# Patient Record
Sex: Female | Born: 1935 | Race: White | Hispanic: No | Smoking: Former smoker
Health system: Southern US, Community
[De-identification: ages and names within clinical notes are randomized; demographics above are authoritative.]

## PROBLEM LIST (undated history)

## (undated) DIAGNOSIS — I10 Essential (primary) hypertension: Secondary | ICD-10-CM

## (undated) DIAGNOSIS — E785 Hyperlipidemia, unspecified: Secondary | ICD-10-CM

## (undated) DIAGNOSIS — I219 Acute myocardial infarction, unspecified: Secondary | ICD-10-CM

## (undated) HISTORY — DX: Hyperlipidemia, unspecified: E78.5

## (undated) HISTORY — PX: CARDIAC CATHETERIZATION: SHX172

## (undated) HISTORY — DX: Essential (primary) hypertension: I10

## (undated) HISTORY — PX: TONSILLECTOMY AND ADENOIDECTOMY: SUR1326

## (undated) HISTORY — PX: CHOLECYSTECTOMY: SHX55

---

## 1993-02-17 HISTORY — PX: ABDOMINAL HYSTERECTOMY: SHX81

## 2000-11-06 ENCOUNTER — Inpatient Hospital Stay (HOSPITAL_COMMUNITY): Admission: EM | Admit: 2000-11-06 | Discharge: 2000-11-15 | Payer: Self-pay | Admitting: *Deleted

## 2000-11-06 ENCOUNTER — Encounter: Payer: Self-pay | Admitting: *Deleted

## 2000-11-06 ENCOUNTER — Emergency Department (HOSPITAL_COMMUNITY): Admission: EM | Admit: 2000-11-06 | Discharge: 2000-11-06 | Payer: Self-pay | Admitting: *Deleted

## 2000-11-10 ENCOUNTER — Encounter: Payer: Self-pay | Admitting: Cardiology

## 2000-11-11 ENCOUNTER — Encounter: Payer: Self-pay | Admitting: *Deleted

## 2000-11-11 ENCOUNTER — Encounter: Payer: Self-pay | Admitting: Cardiology

## 2000-12-02 ENCOUNTER — Inpatient Hospital Stay (HOSPITAL_COMMUNITY): Admission: RE | Admit: 2000-12-02 | Discharge: 2000-12-03 | Payer: Self-pay | Admitting: *Deleted

## 2001-07-13 ENCOUNTER — Encounter: Payer: Self-pay | Admitting: Emergency Medicine

## 2001-07-13 ENCOUNTER — Emergency Department (HOSPITAL_COMMUNITY): Admission: EM | Admit: 2001-07-13 | Discharge: 2001-07-13 | Payer: Self-pay | Admitting: Emergency Medicine

## 2001-07-13 ENCOUNTER — Inpatient Hospital Stay (HOSPITAL_COMMUNITY): Admission: EM | Admit: 2001-07-13 | Discharge: 2001-07-16 | Payer: Self-pay | Admitting: *Deleted

## 2002-03-03 ENCOUNTER — Encounter: Payer: Self-pay | Admitting: General Surgery

## 2002-03-03 ENCOUNTER — Ambulatory Visit (HOSPITAL_COMMUNITY): Admission: RE | Admit: 2002-03-03 | Discharge: 2002-03-03 | Payer: Self-pay | Admitting: General Surgery

## 2005-12-08 ENCOUNTER — Ambulatory Visit (HOSPITAL_COMMUNITY): Admission: RE | Admit: 2005-12-08 | Discharge: 2005-12-08 | Payer: Self-pay | Admitting: Family Medicine

## 2005-12-24 ENCOUNTER — Ambulatory Visit: Payer: Self-pay | Admitting: Gastroenterology

## 2005-12-30 ENCOUNTER — Ambulatory Visit: Payer: Self-pay | Admitting: Gastroenterology

## 2005-12-30 ENCOUNTER — Ambulatory Visit (HOSPITAL_COMMUNITY): Admission: RE | Admit: 2005-12-30 | Discharge: 2005-12-30 | Payer: Self-pay | Admitting: Gastroenterology

## 2006-02-11 ENCOUNTER — Ambulatory Visit: Payer: Self-pay | Admitting: Gastroenterology

## 2006-11-27 ENCOUNTER — Ambulatory Visit: Payer: Self-pay | Admitting: Gastroenterology

## 2006-12-10 ENCOUNTER — Ambulatory Visit (HOSPITAL_COMMUNITY): Admission: RE | Admit: 2006-12-10 | Discharge: 2006-12-10 | Payer: Self-pay | Admitting: Ophthalmology

## 2007-01-07 ENCOUNTER — Ambulatory Visit (HOSPITAL_COMMUNITY): Admission: RE | Admit: 2007-01-07 | Discharge: 2007-01-07 | Payer: Self-pay | Admitting: Ophthalmology

## 2007-02-23 ENCOUNTER — Ambulatory Visit: Payer: Self-pay | Admitting: Gastroenterology

## 2007-04-29 ENCOUNTER — Ambulatory Visit (HOSPITAL_COMMUNITY): Admission: RE | Admit: 2007-04-29 | Discharge: 2007-04-29 | Payer: Self-pay | Admitting: Family Medicine

## 2007-05-03 ENCOUNTER — Ambulatory Visit: Payer: Self-pay | Admitting: Internal Medicine

## 2007-05-10 ENCOUNTER — Ambulatory Visit (HOSPITAL_COMMUNITY): Admission: RE | Admit: 2007-05-10 | Discharge: 2007-05-10 | Payer: Self-pay | Admitting: Family Medicine

## 2008-09-26 ENCOUNTER — Ambulatory Visit: Payer: Self-pay | Admitting: Family Medicine

## 2008-09-26 DIAGNOSIS — M109 Gout, unspecified: Secondary | ICD-10-CM | POA: Insufficient documentation

## 2008-09-26 DIAGNOSIS — E039 Hypothyroidism, unspecified: Secondary | ICD-10-CM | POA: Insufficient documentation

## 2009-01-17 ENCOUNTER — Ambulatory Visit: Payer: Self-pay | Admitting: Family Medicine

## 2009-01-25 ENCOUNTER — Ambulatory Visit: Payer: Self-pay | Admitting: Family Medicine

## 2009-06-04 ENCOUNTER — Ambulatory Visit: Payer: Self-pay | Admitting: Family Medicine

## 2010-06-04 ENCOUNTER — Ambulatory Visit: Payer: Self-pay | Admitting: Family Medicine

## 2010-06-25 ENCOUNTER — Ambulatory Visit: Payer: Self-pay | Admitting: Family Medicine

## 2010-06-25 LAB — HM DEXA SCAN

## 2010-07-02 NOTE — Assessment & Plan Note (Signed)
[name] [name], [name] [name]               CHART#:  [id number]   DATE:  11/27/2006                       DOB:  [birth year]   CHIEF COMPLAINT:  Nausea, reflux.   SUBJECTIVE:  Patient is a 77 year old Caucasian female who has a history  of chronic constipation.  She tells me her constipation is doing quite  well.  Her main complaint today is acid reflux.  She is having problems  with certain foods.  She feels as though she eats too much.  She  complains of indigestion as well as heartburn, especially with spicy  foods.  She is also having some abdominal bloating and nausea but denies  any vomiting.  Her weight has remained stable.  She is having some  epigastric discomfort.  She is taking Advil 1-2 daily, almost every day  of the week along with a daily aspirin 81 mg.  She denies any anorexia  or early satiety.  She occasionally finds herself eating Saltine  crackers to make her stomach feel better.  She denies any dysphagia or  odynophagia.  She denies any rectal bleeding or melena.  She denies any  diarrhea, fever, or chills.  She tells me the pain feels like hunger  pangs.  Her weight has remained stable.   CURRENT MEDICATIONS:  See the list from 11/27/2006.   ALLERGIES:  No known drug allergies.   OBJECTIVE:  Vital signs:  Weight 182 pounds.  Height 60 inches.  Temp  98.4, blood pressure 130/70.  Pulse 72.  General:  Patient is an elderly  Caucasian female who is alert, oriented, positive, cooperative in no  acute distress.  HEENT:  Sclerae are clear.  Nonicteric.  Conjunctivae  pink.  Oropharynx pink and moist without any lesions.  Neck:  Supple  without any mass or thyromegaly.  Chest:  Heart regular rate and rhythm.  Normal S1 and S2 without any murmurs, clicks, rubs, or gallops.  Abdomen:  Positive bowel sounds x4.  No bruits auscultated.  Soft,  nontender, nondistended without palpable mass or hepatosplenomegaly.  No  rebound tenderness or guarding.  Extremities without clubbing or  edema  bilaterally.  Skin:  Pink, warm and dry without any rash or jaundice.   ASSESSMENT:  Patient is a 77 year old female with heartburn,  indigestion, and symptoms of acid reflux without any warning signs at  this point.  She does have history of upper gastrointestinal bleed and  gastritis and was CLO test negative back in 2002 by Dr. Olevia Perches.  She is  taking a significant amount of Advil along with the aspirin, placing her  at risk for gastritis and peptic ulcer disease.  She has chronic  constipation.   PLAN:  1. GERD literature given.  2. She is to begin omeprazole 20 mg daily.  I have given her samples      of Prilosec for two weeks as well as a prescription for omeprazole      20 mg in the morning, #31, with one refill.  3. If no response, she will need further evaluation.  4. Office visit in 4-6 weeks with Dr. Stann Mainland or sooner if needed.       Vickey Huger, N.P.  Electronically Signed     Caro Hight, M.D.  Electronically Signed    KJ/MEDQ  D:  11/27/2006  T:  11/27/2006  Job:  [id number]   cc:   Angus G. Everette Rank, MD

## 2010-07-02 NOTE — Assessment & Plan Note (Signed)
NAMELINNA, [name]               CHART#:  [id number]   DATE:  02/23/2007                       DOB:  [birth year]   REFERRING PHYSICIAN:  Angus G. Everette Rank, MD.   PROBLEM LIST:  1. Uncontrolled gastroesophageal reflux disease beginning in October      of 2008.  2. Constipation secondary to sigmoid and descending colon      diverticulosis.  3. Coronary artery disease.  4. Hypertension.  5. Hypothyroidism.  6. History of gastrointestinal bleed secondary to gastritis in 2002      while on anticoagulation.  7. Cholecystectomy.  8. Hysterectomy.   SUBJECTIVE:  [name] [name] is a 77 year old female who was originally from  [place].  Her last visit with me was in December of 2007.  She took a  trip back to [place].  She was in her usual state of health until  October of 2008 when she began to have burning in her mid abdomen and it  was associated with an increased use of Advil along with daily aspirin.  She was using one to two Advil a day for back pain because she traveled  to [place] and was doing more walking than usual and it caused her back  to hurt.  She was seen in October and asked to discontinue use of Advil  and use Prilosec once a day.  Her symptoms resolved after the addition  of Prilosec and with diet modification.  She reports that she had a  history of gastroesophageal reflux in the past but never required  prescription medications.  She remains active and uses Tylenol now as  needed for pain.  Her symptoms were made worse by cold coffee so she  avoids that.  She now drinks water instead.  She has no problems with  constipation.  She does complain that big pills will not go down and now  she needs to use chewable multivitamin and calcium tablets.  She denies  any problems swallowing solids.  Her appetite is good.  The sensation of  burning that begins in her epigastrium and moves up into her chest is  now resolved.  Her prior problems with reflux occurred with spicy foods  and greasy foods.  Her last upper endoscopy was in 2002 when she was in  the hospital on anticoagulation.   MEDICATIONS:  1. Synthroid.  2. Diovan/HCT.  3. Vytorin.  4. Aspirin.  5. Omeprazole 20 mg daily.  6. Calcium with vitamin D daily.  7. Multivitamin daily.   OBJECTIVE:  VS:  Weight 184 pounds (unchanged since December 2007),  height 5 foot, BMI 35.9 (severely obese), temperature 98.1, blood  pressure 120/78, pulse 80.  GENERAL:  She is in no apparent distress.  Alert and oriented x4.  LUNGS:  Clear to auscultation bilaterally.  CARDIOVASCULAR EXAM:  Regular rhythm, no murmur.  ABDOMEN:  Bowel sounds are present.  Soft, nontender, nondistended.  No  rebound or guarding.   ASSESSMENT:  [name] [name] is a 77 year old female whose epigastric  discomfort was most likely secondary to NSAID gastritis.  The  differential diagnosis includes gastroesophageal reflux disease.  It is  concerning that she had new onset dyspepsia at the age of 37 but her  symptoms are improved and she declined an upper endoscopy.   Thank you for allowing me to see Ms.  [name] in consultation.  My  recommendations follow.   RECOMMENDATIONS:  1. I did speak to [name] [name] about the benefits versus the risks of      upper endoscopy.  She would prefer a non-invasive approach at this      point.  She will continue her Prilosec until March 21, 2007.  If      her symptoms return then I would strongly encourage her to have the      upper endoscopy.  2. She has a follow-up appointment to see me in two months but is      asked to call me if her symptoms return after the Prilosec is      discontinued.  She understands she does not have to wait for two      months to see me if her symptoms return after being off of      Prilosec.       Caro Hight, M.D.  Electronically Signed     SM/MEDQ  D:  02/23/2007  T:  02/24/2007  Job:  [id number]   cc:   Angus G. Everette Rank, MD

## 2010-07-02 NOTE — Assessment & Plan Note (Signed)
[name] [name], [name] [name]               CHART#:  [id number]   DATE:  05/03/2007                       DOB:  03-Feb-1927   CHIEF COMPLAINT:  Followup GERD.   SUBJECTIVE:  The patient is a 77 year old female.  She began to have  dyspepsia and burning in her mid abdomen and it was associated with  Advil and aspirin use.  She was placed on Prilosec and has modified her  diet and she has done quite well.  She does, however, admit continuing  to drink coffee in the morning.  It was felt that she most likely NSAID  gastritis but this was not confirmed with EGD which she declined.  Overall, she is feeling much better.  Her appetite is good.  She is no  longer on PPI.  She rarely has heartburn.  She denies any indigestion.  Her bowel movements are normal, soft and brown.  She denies any rectal  bleeding or melena.  Her weight has remained stable.  She is no longer  taking NSAIDs.   CURRENT MEDICATIONS:  See updated list from 05/03/2007.   ALLERGIES:  NO KNOWN DRUG ALLERGIES.   OBJECTIVE:  VITAL SIGNS:  Weight 182 pounds.  Height 60 inches.  Temp  98.7 degrees.  Blood pressure 140/78.  Pulse 72.  GENERAL:  The patient is a well-developed, well-nourished female in no  acute distress.  HEENT:  Sclerae are clear, nonicteric.  Conjunctivae are pink.  Oropharynx pink and moist without any lesions.  CHEST:  Heart regular rate and rhythm.  Normal S1 and S2.  ABDOMEN:  Positive bowel sounds x4.  No bruits auscultated.  Soft,  nontender, and nondistended without palpable mass or hepatosplenomegaly.  No rebound tenderness or guarding.  EXTREMITIES:  Without clubbing or edema bilaterally.   ASSESSMENT:  The patient is a 77 year old female with recent dyspepsia  given NSAID and aspirin use which responded to PPI.  She is no longer  having any further problems at this time.  She does have a remote  history of peptic ulcer disease.  She has history of chronic  constipation, doing fine at this time.   PLAN:  1. She is going to call us if she has any further problems.  2. If she resumes NSAIDs, she is going to begin PPI, otherwise we will      see her back on an as-needed basis.       Vickey Huger, N.P.  Electronically Signed     R. Garfield Cornea, M.D.  Electronically Signed    KJ/MEDQ  D:  05/04/2007  T:  05/05/2007  Job:  [id number]   cc:   Angus G. Everette Rank, MD

## 2010-07-05 NOTE — Consult Note (Signed)
NAMETREVINA, CATALLO              ACCOUNT NO.:  [id number]   MEDICAL RECORD NO.:  [id number]          PATIENT TYPE:  AMB   LOCATION:  DAY                           FACILITY:  [facility]   PHYSICIAN:  Caro Hight, M.D.      DATE OF BIRTH:  16-Sep-1926   DATE OF CONSULTATION:  12/24/2005  DATE OF DISCHARGE:                                   CONSULTATION   REASON FOR CONSULTATION:  Screening colonoscopy/ hard stools.   HISTORY OF PRESENT ILLNESS:  [name] [name] is a 77 year old Caucasian female who  says over the last four to five months she has noticed problems with her  stools.  She describes them as hard and occasionally ball-like.  Sometimes  they are thin.  This is a change for her.  She used to have normal soft  bowel movements daily.  She has been using prune juice, which does seem to  help some.  She denies any history of constipation.  does have a bowel  movement generally every day.  She denies any rectal bleeding or melena.  Denies any mucus in her stools.  She does have occasional cramp-like right  lower quadrant pain just prior to defecation, which is resolved post-  defecation.  She has never had a colonoscopy.   PAST MEDICAL/SURGICAL HISTORY:  1. Coronary artery disease, status post PERCUTANEOUS TRANSLUMINAL CORONARY      ANGIOGRAPHY.  2. Hypertension.  3. Hypothyroidism.  4. She has a history of GI bleed.  Underwent an EGD by Dr. Delfin Edis in      [place]. She was found to have gastritis, into the pyloric antrum.      A CLO test was negative.  5. She has a history of a tonsillectomy.  6. A partial hysterectomy.  7. A cholecystectomy.   CURRENT MEDICATIONS:  1. Synthroid 75 mcg daily.  2. Diovan/hydrochlorothiazide  100 mL/12.5 mg daily.  3,  Vytorin 10/20 mg daily.  1. Aspirin 8 mg daily.   ALLERGIES:  She believes that she had a reaction to AN ANTIHYPERTENSIVE.   FAMILY HISTORY:  There is no known family history of colorectal carcinoma,  liver or chronic GI  problems.  Mother deceased secondary to ovarian  carcinoma at age 86.  Father deceased at age 2 of old age.  She has three  healthy sisters.   SOCIAL HISTORY:  [name] [name] is a widow.  She has five grown healthy children.  She lives alone.  She has a remote history of tobacco use, smoking less than  a pack a day for about 50 years.  She quit in 2002.  She denies any alcohol  or drug use.   REVIEW OF SYSTEMS:  CONSTITUTIONAL:  Weight is stable.  Denies any fatigue.  CARDIOVASCULAR:  Denies chest pain, palpitations, shortness of breath,  dyspnea, cough or hemoptysis.  GI:  Has occasional intermittent heartburn  once or twice a week.  She states that Tums does seem to help.  She has not  take this on a regular basis.  Denies any anorexia or early satiety.  Denies  any dysphagia  or odynophagia.   PHYSICAL EXAMINATION:  VITAL SIGNS:  Weight 184  pounds, height 60 inches.  Temperature 98 degrees, blood pressure 132/84, pulse 68.  GENERAL:  [name] [name] is a 77 year old female who is alert, oriented, pleasant  and cooperative, in no acute distress.  HEENT:  Pupils are clear, anicteric.  Conjunctivae clear.  Oropharynx pink  and moist without any lesions.  NECK:  Supple without mass or thyromegaly.  CHEST/HEART:  A regular rate and rhythm, with normal S1 and S2, without  murmurs, clicks, rubs or gallops.  LUNGS:  Clear to auscultation bilaterally.  ABDOMEN:  Positive bowel sounds x4.  No bruits auscultated.  Soft.  Mild  right upper quadrant tenderness on deep palpation.  There is no rebound,  tenderness or guarding.  No hepatosplenomegaly or mass.  Exam is limited,  given the patient's body habitus.  EXTREMITIES:  With 1+ lower extremity edema bilaterally.   IMPRESSION:  [name] [name] is a 77 year old Caucasian female with hard stools.  I have suggested stool softeners, as well as a high-fiber diet, which should  help with this problem.  She also complains of transient right lower quadrant pain,  which is resolved  post-defecation.  She has never had a screening colonoscopy, and therefore  will proceed with this, to evaluate for polyps or colorectal carcinoma.   PLAN:  1. A high-fiber diet.  Literature was given.  2. Colace stool softeners once or twice daily as needed for hard stools.  3. A screening colonoscopy with Dr. Caro Hight in the near future.  I      have discussed this procedure including the risks and benefits, which      include bleeding, infection, perforation and drug reaction.  She agrees      with this plan.  A consent will be obtained.      [name] [name], N.P.      Caro Hight, M.D.  Electronically Signed    KC/MEDQ  D:  12/24/2005  T:  12/24/2005  Job:  [id number]

## 2010-07-05 NOTE — Discharge Summary (Signed)
[facility]  Patient:    [name] [name], [name] [name] Visit Number: [id number] MRN: [id number]          Service Type: MED Location: [phone] Attending Physician:  Garth Bigness Dictated by:   Richardson Dopp, P.A. Admit Date:  11/06/2000 Disc. Date: 11/15/00   CC:         Marjean Donna, M.D.  Lowella Bandy. Olevia Perches, M.D. [facility]   Discharge Summary  DATE OF BIRTH:  1926-10-02  DISCHARGE DIAGNOSES: 1. Status post nonST elevation myocardial infarction. 2. Two vessel coronary artery disease. 3. Status post stent to the right coronary artery times two this admission. 4. Residual circumflex disease of 90% - the patient needs stage PCI in the    future. 5. Preserved left ventricular function with ejection fraction of 65%. 6. Acute upper gastrointestinal bleed this admission requiring transfusion    with packed red blood cells.    a. Minimal erosions at the pylorus noted on esophagogastroduodenoscopy       consistent with gastritis.    b. CLOtest negative.    c. No active bleeding noted on esophagogastroduodenoscopy. 7. Hyperthyroidism.  New diagnosis this admission. 8. Hyperlipidemia.  New diagnosis this admission. 9. Post catheterization right femoral pseudoaneurysm.    a. Status post compression this admission.  PROCEDURES PERFORMED THIS ADMISSION: 1. Cardiac catheterization by Dr. Allene Dillon on November 10, 2000    revealing left main normal, LAD ostial 20%, proximal 40%, distal 50%.  Very    small first diagonal noted.  Left circumflex large with proximal - mid 90%    across small OM-1/OM-2. Distal 30% after OM-3.  RCA proximal 40% and 95%,    mid 80%, distal 60%. PDA large with 30% stenosis.  LV gram: Normal wall    motion.  EF greater than 65%.  No MR. 2. Status post PTCA/stent to the RCA times two by Dr. Allene Dillon on    November 10, 2000. 3. EGD performed by Dr. Delfin Edis on November 12, 2000.  HOSPITAL COURSE:  This pleasant,  77 year old female presented on November 06, 2000 with complaints of chest pain.  She originally presented to Dr. Cameron Sprang office and was sent to [facility] ER.  There her enzymes were noted to be mildly elevated and she was transferred to [facility] for further evaluation. On initial exam, her blood pressure was 120/70, pulse 70.  Neck was without JVD or bruits. Lungs were clear to auscultation.  Cardiac: Normal S1 and S2.  Regular rate and rhythm.  No murmurs.  Extremities without edema.  EKG revealed normal sinus rhythm, heart rate 68, left axis deviation and approximately 0.5 mm of ST depression in V2 through 6.  On arrival at [facility], her EKG was much improved with a normal sinus rhythm, heart rate 66, left axis deviation no ST wave changes.  Chest x-ray revealed no acute disease.  Scoliosis and slightly tortuous calcified aorta.  She was admitted and placed on aspirin, heparin, nitroglycerin, and Lopressor. She remained stable throughout the weekend without chest pain.  Plans were for catheterization on Monday, November 09, 2000.  This was postponed due to scheduling.  The procedure was performed on November 10, 2000 by Dr. Elta Guadeloupe Pulsipher.  Plans were for staged PCI of the left circumflex one to two days post intervention on RCA.  She was placed on Integrilin and Plavix.  On November 11, 2000 the patient was brought back to the catheterization laboratory  for possible PCI of the left circumflex.  On arrival, it was noted that her hemoglobin that morning was 11.8.  Previous levels were 13.5 and 14.3.  Her blood pressure had been stable but upon arrival to the laboratory her blood pressure was 96/50.  After arterial access was obtained, her blood pressure dropped to the 60s. She was given IV fluids and Dopamine.  Her abdominal aortogram showed peripheral vascular disease but no evidence of contrast extravasation.  The i-STAT hemoglobin was 9 and hematocrit was  27. She had been given heparin.  This was reversed with protamine sulfate.  Her blood pressure increased to the 100s with fluid and Dopamine. The patient had noted that earlier that day, she had had a hard bowel movement with black stool at the end.  Rectal exam revealed yellow mucus and guaiac positive stool.  She was sent for stat abdominal CT of her abdomen.  This revealed no retroperitoneal bleed but it did show contrast in the left colon suggestive of probable diverticular bleed.  She was transfused with two units of red blood cells.  Gastroenterology was asked to come see the patient.  They felt this was probable aspirin induced ulcer or erosive mucosal disease with GI shed secondary to heparin effect.  They recommend IV Protonix b.i.d.  She was transfused until her hemoglobin was above 10.  On the morning of November 12, 2000 her hemoglobin was 11.1, hematocrit 31.4.  Fluids were continued.  GI saw the patient again on November 12, 2000.  Dr. Delfin Edis performed an upper endoscopy.  The results of this were noted above.  She expected the lesions to heal relatively fast and she recommended holding off on anticoagulation for 24 to 48 hours.  She also recommended continuing Protonix. On the morning of November 13, 2000 her right groin was noted to have a positive bruit. Ultrasound revealed pseudoaneurysm.  This was successfully compressed and follow up ultrasound revealed closure of pseudoaneurysm.  It was recommended to restart Plavix on November 14, 2000. Aspirin was restarted on November 15, 2000.  The patient remained stable without any further chest pain.  Her hemoglobin and hematocrit also remained stable.  On November 14, 2000 her hemoglobin was 11, hematocrit 30.6.  Early on her admission her Altace had been held for hypotension.  This continued to be held throughout admission. On November 15, 2000 she was felt to be in stable condition.  She had been  ambulating  without chest pain.  Cardiac rehab had signed off.  Her vital signs were stable.  Blood pressure was 110/55, pulse 75, O2 saturations 96 on room air.  Temperature 98.6.  Her right groin remained stable without any further bruits.  She did have hematoma and ecchymosis of the right groin that was stable.  Dr. Ron Parker saw the patient on November 15, 2000 and felt the patient was ready for discharge to home.  Consideration can be given to restarting the patients Altace in follow up. She will need a CBC and follow up appointment in one week with either Dr. Verl Blalock or the physician assistant.  The patients TSH was noted to be elevated this admission.  Initial TSH level was 12.006.  Follow up was 17.323.  Free T4 was 0.76 and 0.84.  Dr. Electa Sniff was asked to see the patient.  He felt the patient had hypothyroidism.  He felt there was no evidence of U-thyroid sick syndrome.  He placed the patient on Synthroid 0.025 mg q.d.  The patients lipids  were also noted to be abnormal.  Her lipid profile revealed a total cholesterol of 212, LDL of 146, triglycerides 157, HDL 44.  Admission labs revealed normal LFT profile.  Her total protein was 7.1, albumin 3.6, AST 20, ALT 19, ALP 86 and total bilirubin 0.9.  LABORATORY DATA:  Helicobacter screen negative.  On November 06, 2000, white blood cell count 9300, hemoglobin 11, hematocrit 30.6, platelet count 157,000. Occult blood testing positive times two.  INR on November 06, 2000 was 1.1. On November 13, 2000 sodium 143, potassium 3.8, chloride 111, CO2 27, glucose 103, BUN 9, creatinine 0.8, calcium 9.0, magnesium 2.0, ammonia level 27. Cardiac enzymes:  Total CK #1 was 176, MB 7.8, troponin I 0.06; #2 was 355, MB 14.5, troponin I 0.05; #3 was 310, MB 9.5; #4 136, MB 3.5.  Lipid profile as above.  TSH and free T4 as above.  Chest x-ray as noted on admission as well as on November 11, 2000 with Swan-Ganz catheter in place, mild  bronchitic changes.  DISCHARGE MEDICATIONS: 1. Lopressor 25 mg b.i.d. 2. Zocor 20 mg q.h.s. 3. Plavix 75 mg q.d. 4. Coated aspirin 325 mg q.d. 5. Synthroid 0.025 mg q.d. 6. Protonix 40 mg q.d. 7. Nitroglycerin p.r.n. chest pain.  ACTIVITY:  No driving, heavy lifting, exertion, or work for three days. She has also been asked to do no strenuous activity until after she has recovered from her staged PCI of her left circumflex in the future.  DIET:  Low fat, low sodium.  SPECIAL INSTRUCTIONS:  She is to call our office in [place] for any groin swelling, bleeding or bruising.  FOLLOW-UP:  She is to see Dr. Verl Blalock or physician assistant in one week.  She is to call for an appointment.  She will have CBC checked at that time.  She will see Dr. Delfin Edis in [place] on December 11, 2000 at 11 a.m.  She has been asked to see her primary physician, Dr. Everette Rank in two weeks.  She should call for an appointment.  She will need her TSH followed for her new diagnosis of hyperthyroidism by Dr. Everette Rank. Dictated by:   Richardson Dopp, P.A. Attending Physician:  Garth Bigness DD:  11/15/00 TD:  11/15/00 Job: [id number]

## 2010-07-05 NOTE — Cardiovascular Report (Signed)
[place]. [facility]  Patient:    [name] [name], [name] [name] Visit Number: [id number] MRN: [id number]          Service Type: MED Location: [phone] Attending Physician:  Garth Bigness Dictated by:   Allene Dillon, M.D. [facility] Proc. Date: 11/10/00 Admit Date:  11/06/2000   CC:         Marjean Donna, M.D.  Thomas C. Wall, M.D. [facility]  Cardiac Catheterization Laboratory   Cardiac Catheterization  PROCEDURES PERFORMED: 1. Left heart catheterization with coronary angiography, left    ventriculography, and abdominal aortography. 2. Percutaneous transluminal coronary angiography with stent placement in    the proximal and mid right coronary artery.  INDICATIONS:  [name] [name] is a 77 year old woman who presented with recurrent episodes of chest pain and ruled in for a non-Q wave myocardial infarction.  CATHETERIZATION PROCEDURAL NOTE:  A 6-French sheath was placed in the right femoral artery.  Standard Judkins 6-French catheters were utilized.  Contrast was Omnipaque.  There were no complications.  CATHETERIZATION RESULTS:  HEMODYNAMICS:  Left ventricular pressure 114/14.  Aortic pressure 114/56. There was no aortic valve gradient.  LEFT VENTRICULOGRAM:  Wall motion is normal.  Ejection fraction is estimated at greater than or equal to 65%.  There is no mitral regurgitation.  ABDOMINAL AORTOGRAPHY:  Reveals patent renal arteries bilaterally.  There is very mild atherosclerotic disease of the distal abdominal aorta.  The left common iliac artery has a 30% stenosis proximally.  The right iliac artery has mild disease.  CORONARY ARTERIOGRAPHY (RIGHT DOMINANT): The left main is normal.  The left anterior descending artery has a 20% stenosis at its ostium and a 40% stenosis in the proximal vessel.  The distal LAD after the second diagonal branch is a very small vessel and has a long 50% stenosis.  The LAD gives rise to a small first diagonal branch and a  large branching second diagonal.  The left circumflex is a very large vessel, giving rise to a small first and second obtuse marginal branch, a large third obtuse marginal branch, and a large posterolateral branch.  There is a tubular 90% stenosis in the proximal to mid circumflex, which extends across the origin of both the first and second obtuse marginal branches.  In the distal circumflex beyond the third obtuse marginal is a 30% stenosis.  The first obtuse marginal is very small in size and has a 95% stenosis at its origin.  The second obtuse marginal is also small and has an 80% stenosis at its origin.  The right coronary artery is a dominant vessel.  In the proximal vessel is a 40% stenosis, followed by a 95 stenosis with haze.  Just beyond this, there is an 80% stenosis in the mid vessel and just distal to this is a 60% stenosis in the distal vessel just after the acute margin.  The distal right coronary artery gives rise to a large posterior descending artery which has a diffuse 30% stenosis proximally.  It also gives rise to a small posterolateral branch.  IMPRESSIONS: 1. Normal left ventricular systolic function. 2. Two-vessel coronary artery disease, as described.  The right coronary    artery appears to be the culprit lesion with a 95% stenosis with haziness    in the proximal vessel.  There is also a complex 90% stenosis in the    proximal to mid circumflex.  PLAN:  These findings were reviewed with the patient and her family.  After discussion,  we have opted to proceed with percutaneous intervention to be done in a staged fashion.  See below.  PTCA PROCEDURAL NOTE:  Following the completion of diagnostic catheterization, we proceed with coronary intervention.  The 6-French sheath in the right femoral artery was exchanged over a wire for a 7-French sheath.  Heparin and Integrilin were administered per protocol.  We used a 7-French JR-4 guiding catheter with sideholes  and a short floppy wire.  The lesion was initially predilated with a 3.0 x 18 mm PowerSail balloon, which was inflated to 14 atm in the proximal vessel and 12 atm across the mid vessel.  We then deployed a 3.0 x 24 mm Express-II stent in the mid to distal vessel across the 80% and 60% lesions.  This stent was deployed at 12 atm.  We then postdilated this stent with a 3.0 x 18 mm PowerSail, inflated to 16 atm in the distal aspect of the stent and 18 atm in the proximal aspect of the stent.  We then deployed a 3.0 x 16 mm Express-II in the proximal vessel with minimal overlap of the stent placed in the mid vessel.  This stent was deployed at 18 atm.  We then used a 3.25 x 20 mm Quantum balloon and positioned it across the area of overlap in the two stents in the mid vessel, inflating up to 16 atm.  We then pulled this balloon back to the proximal vessel and inflated to 18 atm.  Intermittent doses of nitroglycerin and Verapamil were administered to maintain TIMI-3 flow.  Final angiographic images were obtained revealing patency of the right coronary artery with 0% residual stenosis in the proximal, mid, and distal sites with TIMI-3 flow.  COMPLICATIONS:  None.  RESULTS:  Successful percutaneous transluminal coronary angiography with stent placement x 2 in the proximal to mid right coronary artery.  A 95% stenosis in the proximal vessel, followed by 80% and 60% stenoses in the mid to distal vessel, were all reduced to 0% residual with TIMI-3 flow.  PLAN:  Integrilin will be continued for 24 hours.  Plavix will be administered for a minimum of four weeks.  We anticipate proceeding with staged intervention of the left circumflex in the next one to two days if the patient remains stable.  Dictated by:   Allene Dillon, M.D. [place] Attending Physician:  Garth Bigness DD:  11/10/00 TD:  11/10/00 Job: [id number]  [id number]

## 2010-07-05 NOTE — Consult Note (Signed)
[place]. [facility]  Patient:    [name] [name], [name] [name] Visit Number: [id number] MRN: [id number]          Service Type: Attending:  Parke Poisson. Electa Sniff, M.D. Dictated by:   Parke Poisson. Electa Sniff, M.D.                            Consultation Report  HISTORY:  This is a 77 year old woman without presents to the hospital with a history of chest pain and a history of recently found arteriosclerotic heart disease with myocardial infarction.  During her evaluation, she was found to have abnormal thyroid studies including a free T4 of 0.76 and a TSH level of 12.0.  There was no family history of thyroid disease.  She has no past history of thyroid disease.  Also noted was that she did have a dyslipidemia with an LDH of 146.  PAST MEDICAL HISTORY:  Essentially negative.  She has had pregnancies in the past.  A hysterectomy.  A tonsillectomy.  She has had gallbladder surgery.  MEDICATIONS PRIOR TO THIS ADMISSION:  Aspirin and vitamins.  FAMILY HISTORY:  Negative for thyroid disease.  Her father died at the age of 64.  Her mother died at the age of 20 from cancer.  She has three sisters, all of whom are younger than her.  None of them have thyroid disease or a history of arteriosclerotic heart disease or a history of dyslipidemia.  PERSONAL HISTORY:  She smokes 1/2 pack of cigarettes per day.  She denies excessive alcohol intake.  She is not allergic to any medications.  REVIEW OF SYSTEMS:  Her weight has been stable.  Prior to her history of chest pain, she states that she has been feeling well and has had no specific symptoms.  No GI complaints.  PHYSICAL EXAMINATION:  GENERAL:  Well-developed woman who appears clinically stable.  HEENT:  Normocephalic.  NECK:  Supple.  The thyroid is not enlarged.  LUNGS:  Clear.  CARDIOVASCULAR:  Rhythm is regular.  ABDOMEN:  Soft.  No masses are present.  No organomegaly is present.  EXTREMITIES:  There is no pedal  edema.  IMPRESSION: 1. History of arteriosclerotic heart disease. 2. Hypothyroidism.  DISCUSSION:  Her T4 is low and her TSH is elevated.  This will be repeated for confirmation.  Subsequent to that, she will be started on thyroid replacement. There is no evidence here that she has euthyroid sick syndrome and, indeed, her dyslipidemia may be related to her hypothyroidism.  Thank you fro the opportunity to see this patient. Dictated by:   Parke Poisson. Electa Sniff, M.D. Attending:  Parke Poisson. Electa Sniff, M.D. DD:  11/09/00 TD:  11/10/00 Job: [id number]

## 2010-07-05 NOTE — Op Note (Signed)
[name] [name], [name] [name]              ACCOUNT NO.:  [id number]   MEDICAL RECORD NO.:  [id number]          PATIENT TYPE:  AMB   LOCATION:  DAY                           FACILITY:  [facility]   PHYSICIAN:  Caro Hight, M.D.      DATE OF BIRTH:  1926/04/04   DATE OF PROCEDURE:  12/30/2005  DATE OF DISCHARGE:                                 OPERATIVE REPORT   REFERRING PHYSICIAN:  Angus G. Everette Rank, MD.   PROCEDURE:  Colonoscopy.   INDICATION FOR EXAM:  Ms. Frattini is a 77 year old female who presents with  change in bowel habits.  She has profound constipation.  She has never had a  screening colonoscopy.  She is average risk for developing colon cancer.   FINDINGS:  1. Sigmoid and descending colon diverticulosis.  Otherwise no polyps,      masses inflammatory changes or vascular ectasia seen.  She had a      significant degree of looping which made intubating the cecum      challenging.  She required a change from the Q140 to the Q160 scope as      well as multiple changes in position and abdominal pressure to achieve      a successful intubation of the cecum.  2. Normal retroflexed view of the rectum.   RECOMMENDATIONS:  1. High fiber diet.  Handout given on constipation, diverticulosis and      high fiber diet.  2. Recommend Colace 100 mg every 8 hours.  She should add Fiber-Sure once      daily.  3. Return patient appointment in 1 month.  4. Screening colonoscopy in 10 years.   MEDICATIONS:  1. Demerol 75 mg IV.  2. Versed 8 mg IV.   PROCEDURE TECHNIQUE:  Physical exam was performed and informed consent was  obtained from the patient after explaining benefits, risks and alternatives  to the procedure.  The patient was connected to the monitor and placed in  the left lateral position.  Continuous oxygen was provided by nasal cannula  and IV medicine administered through an indwelling cannula.  After  administration of sedation and rectal exam, the patient's rectum was  intubated  and the scope was advanced under direct visualization to the cecum.  The  scope was subsequently removed slowly by carefully examining the color,  texture, anatomy and integrity of the mucosa on the way out.  The patient  was recovered in the endoscopy suite and discharged to home in satisfactory  addition.      Caro Hight, M.D.  Electronically Signed     SM/MEDQ  D:  12/30/2005  T:  12/30/2005  Job:  [id number]   cc:   Angus G. Everette Rank, MD  Fax: [phone]

## 2010-07-05 NOTE — Discharge Summary (Signed)
[place]. [facility]  Patient:    [name] [name], [name] [name] Visit Number: [id number] MRN: [id number]          Service Type: Attending:  Cristopher Estimable. Lattie Haw, M.D. [facility] Dictated by:   Mannie Stabile, P.A. Adm. Date:  12/01/00 Disc. Date: 12/03/00                    Referring Physician Discharge [place]  PROCEDURES:  Coronary angiogram/stent of circumflex on December 01, 2000.  REASON FOR ADMISSION:  [name] [name] is a 77 year old female, status post recent non-Q-wave MI/stent of RCA on November 10, 2000, with residual anatomy notable for a high-grade circumflex lesion.  Initial plans were to proceed with staged intervention.  However, [name] [name] hospital course was complicated by upper GI bleed with subsequent evaluation by Lowella Bandy. Olevia Perches, M.D., with esophagogastroduodenoscopy.  [name] patient recently presented to [name] [place], [place], office for follow-up and presented with recurrent chest discomfort.  Following review with Cristopher Estimable. Lattie Haw, M.D., arrangements were made for [name] patient to return for elective percutaneous intervention.  LABORATORY DATA:  HGB 10.4, HCT 30.7, WBC 6.5, and platelets 256 on admission. HGB 10.0, HCT 29.1, WBC 6.9, and platelets 225 at discharge.  Metabolic profile normal.  Cardiac enzymes (post intervention):  CPK 196/21 with follow-up CPK 166/16 at [name] time of discharge.  HOSPITAL COURSE:  [name] patient was admitted for elective percutaneous intervention of a high-grade circumflex lesion on December 01, 2000.  Procedure performed by Allene Dillon, M.D. (see catheterization report for full details).  Findings notable for widely patent RCA at [name] recent stent sites (x 2) with 30% stenosis proximal to [name] stent and 20% distally.  [name] circumflex revealed a 90% lesion across two small OM branches.  Dr. Vicenta Aly presented with successful stenting of [name] 90% lesion to 0% residual stenosis with restoration of TIMI-3 flow and no noted  complications.  [name] patient was treated with Integrilin x 8 hours and will be on Plavix for an additional four weeks.  [name] patient as kept for overnight observation given her anemia and mild elevation of CPK.  Both of these indices were stable and [name] cardiac enzymes were trending downward at [name] time of discharge [name] following day.  DISCHARGE MEDICATIONS: 1. Plavix 75 mg q.d. (x 4 weeks). 2. Coated aspirin 325 mg q.d. 3. Synthroid 0.025 mg q.d. 4. Zocor 20 mg q.h.s. 5. Metoprolol 25 mg b.i.d. 6. Protonix 40 mg q.d. 7. Nitrostat as directed.  ACTIVITY:  No heavy lifting/driving or heavy exertion x 2 days.  DIET:  Low-fat/cholesterol diet.  WOUND CARE:  Call [name] office if there is any bleeding/swelling of [name] groin.  FOLLOW-UP:  [name] patient is scheduled to follow up with Cristopher Estimable. Lattie Haw, M.D., at [name] [place], [place], clinic on Friday, January 01, 2001, at 110 a.m.  DISCHARGE DIAGNOSES: 1. Coronary artery disease.    a. Status post stent of 90% circumflex on December 01, 2000.    b. Status post non-Q-wave myocardial infarction/stent of right coronary       artery (x 2) on November 10, 2000.  Widely patent by this angiogram. 2. Status post recent upper gastrointestinal bleed. 3. Dyslipidemia. 4. Dyslipidemia. 5. Treated hypothyroidism. Dictated by:   Mannie Stabile, P.A. Attending:  Cristopher Estimable. Lattie Haw, M.D. [facility] DD:  12/03/00 TD:  12/03/00 Job: [id number]

## 2010-07-05 NOTE — Cardiovascular Report (Signed)
[place]. [facility]  Patient:    [name] [name], [name] [name] Visit Number: [id number] MRN: [id number]          Service Type: MED Location: [id number] Attending Physician:  Coralie Keens Dictated by:   Vanna Scotland Olevia Perches, M.D. [facility] Proc. Date: 07/15/01 Admit Date:  07/13/2001 Discharge Date: 07/16/2001   CC:         Marjean Donna, M.D.  Allene Dillon, M.D. [facility] C. Johnsie Cancel, M.D. [facility]  Cristopher Estimable. Lattie Haw, M.D. [facility]  Cardiopulmonary Laboratory   Cardiac Catheterization  PROCEDURES PERFORMED: Percutaneous coronary intervention.  CLINICAL HISTORY: The patient is a 77 years old and seven months ago had tandem overlying stents placed to the right coronary artery. These were a 3.0 x 24 and a 3.0 x 16 stents. She was recently readmitted with unstable angina and studies yesterday by a physician, who found an 80% stenosis within the stent.  DESCRIPTION OF PROCEDURE: The procedure was performed via the right femoral artery using an arterial sheath and 7 [place] JR4 guide catheter with side holes. The patient was given Angiomax bolus and infusion. We used a short floppy wire and crossed the lesion in the midportion of the right coronary artery without difficulty. We initially went in with a 3.25 x 15 mm Cutting Balloon. Because of a band in the vessel we had difficulty navigating the Cutting Balloon around the first band and into the midportion of the vessel. We were able to accomplish this by prolapsing the right coronary catheter against the opposite wall of the aorta. We then performed multiple inflations within the two overlying stents in the mid right coronary artery. We performed a total of eight inflations up to 10 atmospheres for 53 seconds. We then placed a Galileo centering catheter 3.0 x 52 cm in the midportion of the vessel. We delivered brachytherapy for 23 seconds delivering 20 Gy to the affected area. The centering catheter was then removed and repeat  diagnostic studies were performed through the guiding catheter. The patient tolerated the procedure well and left the laboratory in satisfactory condition.  RESULTS: Initially, there was moderate diffuse disease within the two stents with a focal narrowing of 80 and 60%. Following Cutting Balloon angioplasty and after the brachytherapy, the stenoses were reduced to less than 20%.  CONCLUSIONS: Successful Cutting Balloon angioplasty and brachytherapy for in-stent re-stenosis within two overlapping stents in the mid right coronary artery with improvement in percent diameter narrowing from 80% to less than 20%.  DISPOSITION: The patient was returned to the postangioplasty unit for further observation. Dictated by:   Vanna Scotland Olevia Perches, M.D. [place] Attending Physician:  Coralie Keens DD:  07/15/01 TD:  07/17/01 Job: [id number]

## 2010-07-05 NOTE — H&P (Signed)
[place]. [facility]  Patient:    [name] [name], [name] [name] Visit Number: [id number] MRN: [id number]          Service Type: EMS Location: ED Attending Physician:  Saunders Revel Dictated by:   Signa Kell, M.D. [facility] Admit Date:  07/13/2001 Discharge Date: 07/13/2001   CC:         Marjean Donna, M.D.  Cristopher Estimable. Lattie Haw, M.D. [facility]   History and Physical  CHIEF COMPLAINT: The patient is a 77 year old married white female, with known coronary artery disease, with a three week history of recurrent chest pain associated with episodes of elevated blood pressure.  HISTORY OF PRESENT ILLNESS: The patient has known coronary artery disease and had a non-Q wave MI with a stent to the RCA on November 10, 2000 and then a stent to the circumflex in October 2002.  The initial PCI was complicated by GI bleed and right pseudoaneurysm.  The patient describes the present symptoms as lasting for an hour or two, associated with elevated blood pressure.  She has not had atypical exertional angina; however, she describes her present symptoms as similar to that which she experienced during balloon inflation and also prior to her initial PCI.  ALLERGIES: NORVASC, which caused palpitations.  CURRENT MEDICATIONS:  1. Valsartan.  2. Hydrochlorothiazide 160 mg q.d.  3. Metoprolol 25 mg b.i.d.  4. Aspirin.  5. Synthroid 0.025.  6. Zocor 40 mg.  PAST MEDICAL HISTORY:  1. Hypertension.  2. Hyperlipidemia.  3. Hypothyroidism.  4. Upper GI bleed secondary to anticoagulation.  5. Her EF was 65% at the time of catheterization.  SOCIAL HISTORY: She is married, five children, 10 grandchildren, two Designer, industrial/product.  She smoked for 50 years but stopped in September 2002.  FAMILY HISTORY: Mother died of uterine cancer.  Father died at 63 of unknown.  REVIEW OF SYSTEMS: Unremarkable except for cardiorespiratory and GU.  She notes frequency when she take diuretics.   NEUROPSYCHIATRIC: She has occasional leg cramps with diuretics.  She did not have any GI symptoms but does have a history of GI bleed.  PHYSICAL EXAMINATION:  VITAL SIGNS: Blood pressure 164/76, pulse 67.  Afebrile.  Respirations 20.  HEENT: Unremarkable.  NECK: JVP not elevated.  Carotid pulses palpated without bruits.  LUNGS: Clear.  CARDIAC: I did not hear a significant murmur.  There is no gallop.  ABDOMEN: Normal.  EXTREMITIES: No edema.  NEUROLOGIC: Unremarkable.  LABORATORY DATA: EKG reveals normal sinus rhythm, within normal limits.  IMPRESSION:  1. Known coronary artery disease, seven months post right coronary     artery angioplasty and stent and eight months post post circumflex stent,     now presenting with recurrent prolonged chest pain.  2. Hypertension.  3. Hyperlipidemia.  4. Hypothyroidism.  5. Cigarette abuse.  PLAN: The patient was seen at [facility] Emergency Room and transferred here for consideration of coronary angiography.  With her present symptoms and known coronary disease I certainly agree that this would be the best approach. Dictated by:   Signa Kell, M.D. [place] Attending Physician:  Saunders Revel DD:  07/13/01 TD:  07/14/01 Job: [id number]

## 2010-07-05 NOTE — Cardiovascular Report (Signed)
[place]. [facility]  Patient:    [name] [name], [name] [name] Visit Number: [id number] MRN: [id number]          Service Type: MED Location: [place] 01 Attending Physician:  Garth Bigness Dictated by:   Allene Dillon, M.D. [facility] Proc. Date: 11/11/00 Admit Date:  11/06/2000   CC:         Marjean Donna, M.D.  Thomas C. Wall, M.D. [facility]  Cardiac Catheterization Laboratory   Cardiac Catheterization  PROCEDURES PERFORMED: 1. Right heart catheterization. 2. Abdominal aortography.  INDICATIONS: The patient is a 77 year old woman, who presented several days ago with a non-Q-wave myocardial infarction. She had been maintained on heparin therapy. Yesterday, cardiac catheterization revealed a 95% stenosis in the right coronary artery and a 90% in the left circumflex. We treated the right coronary artery with stent placement x2. She was left on Integrilin overnight.  She was brought to the laboratory today with the intention of treating the left circumflex. Of note, just prior to coming to the catheterization laboratory, the patient had a blood pressure of 96/50 where as it had been running normal prior to that.  DESCRIPTION OF PROCEDURE: We initially placed a 7 French sheath in the right femoral artery. We advanced a 7 [place], Voda left 3.5 guiding catheter into the proximal aorta.  Our initial pressure measurement revealed a systolic blood pressure of 90; however, this then gradually decreased into the 70s. Intravenous fluid boluses were administered though two separate IVs, and intravenous dopamine was started. We then performed an abdominal aortogram to rule out any obvious extravasation of contrast to suggest a retroperitoneal bleed.  This did reveal mild atherosclerotic disease in the abdominal aorta with moderate atherosclerotic disease in both iliac arteries, greater on the right than the left with approximately 60-70% in the right external iliac artery.   However, there was no evidence of contrast extravasation.  We then performed a right heart catheterization to assess her volume status. The following pressures were obtained:  Mean right atrial pressure was 1, right ventricular pressure 20/1, pulmonary artery pressure 18.6 and pulmonary capillary wedge pressures was 3. We did a stat hemoglobin and hematocrit which revealed a hemoglobin of 9, hematocrit of 27, compared to a hemoglobin of 11.8 this morning. This had been 13.5 on the days preceding.  At that point, it was apparent the patient was having acute bleed, either gastrointestinal bleed or retroperitoneal bleed.  She did have a large bowel movement earlier today which was firm, and she stated it was dark at the end of the stool. I performed a rectal examination which showed no stool in the vault, just a small amount of mucous which did test guaiac-positive.  The patient was stabilized hemodynamically with the dopamine and fluids with an ending systolic blood pressure greater than 120.  Stat type and cross was sent. The patient was transported to the CT scanner for a stat abdominal CT scan to rule out retroperitoneal bleed.  A Swan-Ganz catheter was left in place for hemodynamic monitoring and the patient will be monitored very closely in the CCU with serial hematocrits and transfusion as indicated.  Also of note, we did administer protamine, at total of 15 units to reverse the intravenous heparin which had been administered, and which did normalize her ACT. We also stopped the Integrilin.  IMPRESSION: Hypovolemia, secondary to acute hemorrhage, rule out retroperitoneal hemorrhage versus gastrointestinal bleeding.  PLAN: As outlined above. The patient was transported to the radiology suite in stable  condition. Dictated by:   Allene Dillon, M.D. [place] Attending Physician:  Garth Bigness DD:  11/11/00 TD:  11/11/00 Job: [id number]

## 2010-07-05 NOTE — Discharge Summary (Signed)
[place]. [facility]  Patient:    [name] [name] Visit Number: [id number] MRN: [id number]          Service Type: MED Location: [id number] Attending Physician:  [name] [name] Dictated by:   [name] [name], N.P. Admit Date:  07/13/2001 Discharge Date: 07/16/2001   CC:         [name] [name], M.D.   Discharge Summary  DATE OF BIRTH:  1927/02/06  CARDIOLOGIST:  [name] [name] [name], M.D. [facility].  PRIMARY CARE:  [name] [name], M.D.  REASON FOR ADMISSION:  Prolonged chest pain.  DISCHARGE DIAGNOSES: 1. Coronary artery disease, status post elevated creatine phosphokinases    with normal troponins, end-stent restenosis found on angiography this    admission of a right coronary artery stent placed in September of 2002.    A stent to the circumflex placed in October of 2002 exhibits 30-40%    end-stent restenosis.  Other vessels are as follows: Left main 20%; left    anterior descending 40% proximally, very small distally; the circumflex has    a 50% proximal stenosis.  The obtuse marginal 1 has a small branch which    is "jailed" by the stent with a 90% ostial stenosis.  The obtuse marginal 2    is large and has a 30% proximal stenosis.  The right coronary artery is    stenosed 40% proximally, 60-70% midvessel at the distal aspect of the    stent with a 40% stenosis seen at the posterior descending    artery/proximal left anterior bifurcation.  Left ventricular function    55% with mild apical hypokinesis.  The right coronary artery stent was    treated with brachytherapy this admission. 2. Hypertension. 3. Hyperlipidemia. 4. Hypothyroidism. 5. History of right pseudoaneurysm requiring compression. 6. History of upper gastrointestinal bleed.  HISTORY OF PRESENT ILLNESS:  This delightful 77 year old lady with very complicated cardiac history as outlined above came to our attention with complaints of ongoing chest pain with associated increases in  blood pressure. On the day of admission this pain lasted two hours at rest.  Given her history of non-Q-wave MI requiring stent placement x2, she elected to be evaluated here with coronary angiography.  HOSPITAL COURSE:  The patient was admitted to telemetry and continued on her home medications.  Her blood pressure was brought into control and she started on Lovenox.  Serial labs were drawn and she was placed on the waiting list on the add-on board for cardiac catheterization two days after admission. Angiographic findings were as noted above; this was performed May 28, by Dr. Johnsie [name].  She tolerated the procedure well.  Films were reviewed with Dr. Olevia [name] and it was decided to treat her end-stent restenosis of the RCA with brachytherapy.  This was performed May 29, by Dr. Tammi [name] and it was also tolerated well.  Serial labs were drawn during the patients admission and it was found that she had some abnormalities in thyroid hormone and thyroid labs.  Her TSH was quite elevated at 16.43, a free T4 and free T3 were obtained and these were 0.085 and 2.5, respectively.  The patient was maintained here on her Synthroid at the present dose and agrees to follow up with Dr. Everette [name] the week of discharge.  PHYSICAL EXAMINATION:  GENERAL:  On the day of discharge the patient offered no complaints of chest pain, shortness of breath or palpitations.  She slept very well for the first time in a long  time.  The patient is in no acute distress.  VITAL SIGNS:  Blood pressure 105/55, 66, 20, 93% on room air, 97.6.  Telemetry revealed a normal sinus rhythm without ischemic changes, rate of 70.  EKG obtained early morning confirmed this.  CARDIOVASCULAR:  Regular rate and rhythm, S1 and S2, sounds were distant. There is a 2/6 systolic ejection murmur heard best at the left sternal border.  LUNGS:  Clear to auscultation bilaterally.  EXTREMITIES:  Without cyanosis, clubbing or edema.  Right  femoral catheterization site is benign without evidence of hematoma or bruit.  DATA:  Exit CK was 140, MB 3.8.  Hemogram at discharge: WBC 8.0, hemoglobin 11.9, hematocrit 34.5, platelets 187.  Lipid profile obtained while admitted during admission revealed total cholesterol 193, triglycerides 106, HDL 44, LDL 128.  Chemistry prior to discharge: Sodium 139, potassium 3.5, chloride 105, CO2 26, BUN 12, creatinine 1.1, and glucose 105.  Cardiac markers: Set #1 CK 224, MB 7.7, relative index 3.4, troponin I 0.02; set #2 CK 206, MB 6.1, relative index 3.0, troponin 0.02.  TSH as mentioned was high at 16.43.  DISPOSITION:  The patient is discharged to home in the care of her very supportive husband on the following medications.  DISCHARGE MEDICATIONS: 1. Lopressor 50 mg one b.i.d. 2. Diovan 320 mg one q.d. 3. Aspirin 325 one q.d. 4. Synthroid 0.25 mcg one q.d. 5. Zocor 40 mg one h.s. 6. Plavix 75 mg one q.d. x6 months. 7. Nitroglycerin 0.4 mg sublingual as directed for chest pain.  ACTIVITY RESTRICTIONS:  No heavy lifting, driving, sex, tub baths for two days.  The patient is encouraged to walk 30-40 minutes a day and lose weight.  DIET RECOMMENDED:  Low-fat, low-cholesterol diet.  WOUND CARE:  The patient agrees to call the office if her groin wound becomes hard or painful.  SPECIAL INSTRUCTIONS:  She will record blood pressures and bring a record of the ambulatory monitorings to her followup which will be with Dr. Laverda [name]. on June 17, at [facility].  She agrees to make an appointment with Dr. Everette [name] for followup of her TSH level and will call in the interim with any problems, questions or concerns. Dictated by:   [name] [name], N.P. Attending Physician:  [name] [name] DD:  07/16/01 TD:  07/16/01 Job: [id number]

## 2010-07-05 NOTE — Procedures (Signed)
[place]. [facility]  Patient:    [name] [name], [name] [name] Visit Number: [id number] MRN: [id number]          Service Type: MED Location: [facility] [id number] 01 Attending Physician:  Garth Bigness Dictated by:   Lowella Bandy. Olevia Perches, M.D. [facility] Proc. Date: 11/12/00 Admit Date:  11/06/2000   CC:         Marcello Moores C. Wall, M.D. [facility]   Procedure Report  PROCEDURE:  Upper endoscopy.  INDICATIONS:  This 77 year old white female developed _________ upper GI bleeding with passage of melanic stools following cardiac catheterization and heparinization.  She gives no previous GI history.  Heparin was discontinued last night.  The patient has been on Protonix IV and is scheduled for upper endoscopy.  ENDOSCOPE:  Fujinon single-channel video endoscope.  SEDATION:  Versed 2.5 mg IV, fentanyl 25 mcg IV.  FINDINGS:  The Fujinon single-channel video endoscope was passed under direct vision through the posterior pharynx into the esophagus.  The patient was monitored by pulse oximeter.  Oxygen saturations were 95-98%.  Proximal esophageal mucosa was normal.  There was a small 3-4 cm hiatal hernia disc through the G junction.  STOMACH:  The stomach was insufflated with air and showed normal anterior gastric folds on the body of the stomach and gastric antrum.  Prepyloric antrum had at least two mini erosions surrounded by intense erythema and fiber mucosa.  There was also slight exudate in one of the erosions.  These were rather superficial and small lesions measuring several mm.  This mucosa was quite friable and had trouble with the endoscope through the pylorus with some bleeding from the orifice of the pylorus channel.  DUODENUM:  Duodenum _________ duodenum was normal.  A few specks of coffee ground material were floating in the gastric antrum and duodenum.  Endoscope was then brought back into the stomach _________ normal.  There were no large lesions present.  Biopsies were taken  from CLO test.  IMPRESSION:  Gastritis into pyloric antrum, status post CLO test.  PLAN:  The patients bleeding was precipitated by severe anticoagulation in the setting of superficial gastric lesions.  The lesions themselves are not extended and do not appear chronic and should be likely to heal up within the next 24-48 hours if the patient stays on Protonix.  I would hold off her anticoagulants including aspirin for the next 24-48 hours.  The patient is not going to need IV heparin.  She should be protected in the future with PPIs if she is to be anticoagulated. Dictated by:   Lowella Bandy. Olevia Perches, M.D. [place] Attending Physician:  Garth Bigness DD:  11/12/00 TD:  11/12/00 Job: [id number]

## 2010-07-05 NOTE — Cardiovascular Report (Signed)
[place]. [facility]  Patient:    [name], [name] Visit Number: [id number] MRN: [id number]          Service Type: CAT Location: [id number] Attending Physician:  Allene Dillon Dictated by:   Allene Dillon, M.D. [facility] Proc. Date: 12/01/00 Admit Date:  12/01/2000   CC:         Marcello Moores C. Wall, M.D. [facility]  Marjean Donna, M.D.  Cardiac Catheterization Lab   Cardiac Catheterization  PROCEDURE PERFORMED: 1. Selective coronary angiography of the right coronary artery. 2. Percutaneous transluminal coronary angioplasty with stent placement    in the mid left circumflex coronary artery.  CARDIOLOGIST:  Allene Dillon, M.D.  INDICATIONS:  [name] is a 77 year old woman who was admitted to the hospital in mid September with a non-Q wave myocardial infarction.  On November 10, 2000, she underwent PTCA with stent placement x 2 in the proximal and mid right coronary artery.  At that time, she was also found to have a long 90% stenosis in the proximal and mid left circumflex coronary artery.  Initial plans were for staged intervention of the left circumflex. However, her hospital course was complicated by an upper GI bleed secondary to superficial gastric ulcer.  She was treated medically and has since remained stable.  Of note, she did require a two unit blood transfusion for her GI bleeding.  Plans were to delay the intervention of the left circumflex to allow healing of her ulcer.  However, over the past few days she has had recurrent episodes of chest pain occurring at rest.  She was therefore referred today to staged percutaneous intervention of the left circumflex coronary artery.  A 7 French sheath was placed in the left femoral artery.  Heparin and single bolus Integrelin were administered per protocol.  We initially performed angiography of the right coronary artery with a 6 French JL-4 catheter.  This revealed the stents in the proximal to mid  vessel to be widely patent with 0% stenosis within these stents.  In the proximal right coronary artery prior to the origin of the stent was a 30% stenosis.  In the distal right coronary artery behind the stents was a diffuse 20% stenosis.  The posterior descending artery itself was a large vessel and had a 30% stenosis proximally.  We then turned our attention to the left circumflex coronary artery.  We used a 7 [place] Voda left 3.5 guiding catheter and a BMW wire.  The lesion was predilated with a 2.75 x 18 mm Powersail balloon inflated to 12 atmospheres in the distal part of the lesion and 12 atmospheres in the proximal part of the lesion.  We then deployed a 3.0 x 24 mm AVES7 stent across the lesion in the proximal mid circumflex and had a deployment pressure of 10 atmospheres.  The stent was then postdilated with a 3.0 x 18 mm Powersail balloon inflated to 18 atmospheres in the distal aspect of the stent, 14 atmospheres in the proximal aspect of the stent.  Of note, there were two very small marginal branches arising from within the area of diseased segment of the circumflex.  Flow in both of these branches was compromised after the stent was deployed.  We did attempt to cross the side branches with a traverse wire, however, we were unsuccessful.  Again, these branches were very small being less than 2 mm in diameter.  With additional doses of intracoronary nitroglycerin, however, the flow was restored in both  of these small marginal branches such that at the conclusion of the case there was slightly less than TIMI-3 flow in the side branches.  The main circumflex itself was widely patient with 0% residual stenosis and TIMI-3 flow.  COMPLICATIONS:  None.  RESULTS:  Successful percutaneous transluminal coronary angioplasty with stent placement in the proximal to mid left circumflex.  A long 90% stenosis was reduced to 0% residual with TIMI-3 flow.  Patency of the two obtuse  marginal branches was preserved as described above.  PLAN:  Integrelin will be continued for eight hours. We will stop the Integrelin after eight hours because of her history of recent GI bleeding. She will be continued on aspirin and Plavix combination for a minimum of four weeks followed by lifelong aspirin therapy. Dictated by:   Allene Dillon, M.D. [place] Attending Physician:  Allene Dillon DD:  12/01/00 TD:  12/01/00 Job: [id number]

## 2010-07-05 NOTE — Cardiovascular Report (Signed)
[place]. [facility]  Patient:    [name] [name], [name] [name] Visit Number: [id number] MRN: [id number]          Service Type: MED Location: [id number] Attending Physician:  Coralie Keens Dictated by:   Wallis Bamberg. Johnsie Cancel, M.D. [facility] Proc. Date: 07/14/01 Admit Date:  07/13/2001   CC:         Marjean Donna, M.D.  Cristopher Estimable. Lattie Haw, M.D. [facility]   Cardiac Catheterization  INDICATIONS:  Subendocardial myocardial infarction, previous stenting of the circumflex and right coronary artery in September and October 2002.  TECHNIQUE:  Cine catheterization was done from the right femoral artery. After review of her previous angiogram, she did have some peripheral vascular disease, and we initially entered the groin with the right Judkins catheter and Wholey wire with no difficulty.  FINDINGS: 1. LEFT MAIN CORONARY ARTERY:  Had a 20% discrete stenosis. 2. LEFT ANTERIOR DESCENDING ARTERY:  Had a 40% tubular lesion proximally.    The distal vessel was small, due to the takeoff of a very large second    diagonal branch.  There were 30% multiple discrete lesions in the mid    vessel. 3. CIRCUMFLEX CORONARY ARTERY:  A large artery.  There appeared to be    overlapping stents close to the ostium of the left main.  There was a    50% proximal lesion, a 30-40% in-stent restenosis in the mid vessel.  The    first obtuse marginal branch was very small and appeared to be jailed, with    90% ostial stenoses from within the stent.  These were no amenable to    intervention.  The second obtuse marginal branch was a large vessel, with    30% tubular lesion proximally.  The distal vessel was also large without    significant disease. 4. RIGHT CORONARY ARTERY:  Dominant.  There was a 40% proximal lesion, and    there were overlapping stents in the mid vessel.  There was a 60-70% distal    lesion at the most distal edge of the stent.  There was 40% bifurcating    lesion at the takeoff of the  PDA/PLA.  VENTRICULOGRAM:  RAO ventriculography revealed mild apical hypokinesis. Ejection fraction is in the 55% range.  There was no gradient across the aortic valve and no MR.  IMPRESSION:  Films were reviewed with Dr. Olevia Perches, and we were not sure if the infarct artery represented the small obtuse marginal branch or not.  However, we both agreed that the in-stent restenosis in the distal portion of the mid right stent was worth intervening on.  Dr. Olevia Perches will do this tomorrow, with brachytherapy.  The patient tolerated this diagnostic catheterization well, with no evidence of peripheral complications. Dictated by:   Wallis Bamberg Johnsie Cancel, M.D. [place] Attending Physician:  Coralie Keens DD:  07/14/01 TD:  07/16/01 Job: [id number]

## 2010-11-27 LAB — BASIC METABOLIC PANEL
CO2: 26
Chloride: 106
Creatinine, Ser: 1.35 — ABNORMAL HIGH
GFR calc Af Amer: 46 — ABNORMAL LOW
Potassium: 3.7

## 2010-11-27 LAB — HEMOGLOBIN AND HEMATOCRIT, BLOOD
HCT: 37
Hemoglobin: 12.7

## 2011-10-09 ENCOUNTER — Ambulatory Visit: Payer: Self-pay | Admitting: Family Medicine

## 2012-07-08 ENCOUNTER — Other Ambulatory Visit: Payer: Self-pay | Admitting: Family Medicine

## 2012-07-08 NOTE — Telephone Encounter (Signed)
Please pull paper chart.  

## 2012-07-13 NOTE — Telephone Encounter (Signed)
No paper chart °

## 2012-10-22 ENCOUNTER — Ambulatory Visit: Payer: Self-pay | Admitting: Family Medicine

## 2013-10-10 LAB — HEMOGLOBIN A1C: HEMOGLOBIN A1C: 5.8 % (ref 4.0–6.0)

## 2013-11-03 ENCOUNTER — Ambulatory Visit: Payer: Self-pay | Admitting: Family Medicine

## 2014-01-19 ENCOUNTER — Ambulatory Visit: Payer: Self-pay | Admitting: Family Medicine

## 2014-04-10 LAB — TSH: TSH: 2.61 u[IU]/mL (ref 0.41–5.90)

## 2014-04-10 LAB — BASIC METABOLIC PANEL
BUN: 29 mg/dL — AB (ref 4–21)
Creatinine: 1.3 mg/dL — AB (ref 0.5–1.1)
GLUCOSE: 111 mg/dL
Potassium: 4.1 mmol/L (ref 3.4–5.3)
SODIUM: 141 mmol/L (ref 137–147)

## 2014-04-10 LAB — LIPID PANEL
Cholesterol: 162 mg/dL (ref 0–200)
HDL: 48 mg/dL (ref 35–70)
LDL CALC: 87 mg/dL
Triglycerides: 134 mg/dL (ref 40–160)

## 2014-04-10 LAB — HEPATIC FUNCTION PANEL: ALT: 29 U/L (ref 7–35)

## 2014-08-16 DIAGNOSIS — D649 Anemia, unspecified: Secondary | ICD-10-CM | POA: Insufficient documentation

## 2014-08-16 DIAGNOSIS — I252 Old myocardial infarction: Secondary | ICD-10-CM | POA: Insufficient documentation

## 2014-08-16 DIAGNOSIS — N183 Chronic kidney disease, stage 3 unspecified: Secondary | ICD-10-CM | POA: Insufficient documentation

## 2014-08-16 DIAGNOSIS — I83893 Varicose veins of bilateral lower extremities with other complications: Secondary | ICD-10-CM | POA: Insufficient documentation

## 2014-08-16 DIAGNOSIS — E669 Obesity, unspecified: Secondary | ICD-10-CM | POA: Insufficient documentation

## 2014-08-16 DIAGNOSIS — K219 Gastro-esophageal reflux disease without esophagitis: Secondary | ICD-10-CM | POA: Insufficient documentation

## 2014-08-16 DIAGNOSIS — M8580 Other specified disorders of bone density and structure, unspecified site: Secondary | ICD-10-CM | POA: Insufficient documentation

## 2014-09-04 ENCOUNTER — Encounter: Payer: Self-pay | Admitting: Family Medicine

## 2014-09-04 ENCOUNTER — Ambulatory Visit (INDEPENDENT_AMBULATORY_CARE_PROVIDER_SITE_OTHER): Payer: Medicare PPO | Admitting: Family Medicine

## 2014-09-04 VITALS — BP 158/70 | HR 68 | Temp 97.5°F | Resp 16 | Wt 160.0 lb

## 2014-09-04 DIAGNOSIS — L03119 Cellulitis of unspecified part of limb: Secondary | ICD-10-CM

## 2014-09-04 DIAGNOSIS — L02619 Cutaneous abscess of unspecified foot: Secondary | ICD-10-CM | POA: Diagnosis not present

## 2014-09-04 MED ORDER — CEPHALEXIN 500 MG PO CAPS: 500.0000 mg | ORAL_CAPSULE | Freq: Four times a day (QID) | ORAL | Status: AC

## 2014-09-04 NOTE — Progress Notes (Signed)
       Patient: [name] [name] Female    DOB: [birth year]   81 y.o.   MRN: [id number] Visit Date: 09/04/2014  Today's Provider: [name], MD   Chief Complaint  Patient presents with  . Toe Pain   Subjective:    Toe Pain  Pain location: big toe of right foot. Quality: tenderness. The pain has been intermittent since onset. Associated symptoms include an inability to bear weight, numbness and tingling. The symptoms are aggravated by weight bearing and movement (wearing shoes). Treatments tried: anti fungal topical cream.  Patient states she thinks her toe may be infected. Patient has swelling and redness of the big toe on her right foot. Patient did not notice that her toe was swollen and red until she wet to a nail salon and the nail technician told her. Patient also has a discoloration of the big toe.      Allergies  Allergen Reactions  . Atorvastatin     myalgias/arthralgias.  . Lovastatin     Other reaction(s): Muscle Pain   Previous Medications   ALLOPURINOL (ZYLOPRIM) 100 MG TABLET    Take by mouth.   ASPIRIN 81 MG TABLET    Take by mouth.   CALCIUM CARBONATE-VITAMIN D PO    Take by mouth.   DIPHENHYDRAMINE-ZINC ACETATE (BENADRYL) CREAM    BENADRYL EXTRA STRENGTH, 2-0.1% (External Cream) - Historical Medication  1 as needed (2-0.1 %) Active Comments: Medication taken as needed.    HYDROCODONE-ACETAMINOPHEN (NORCO/VICODIN) 5-325 MG PER TABLET    Take by mouth.   IBUPROFEN (ADVIL,MOTRIN) 600 MG TABLET    Take by mouth.   LANSOPRAZOLE (PREVACID) 15 MG CAPSULE    Take by mouth.   LEVOTHYROXINE (SYNTHROID, LEVOTHROID) 75 MCG TABLET    Take by mouth.   MULTIPLE MINERALS-VITAMINS PO    Take by mouth.   SIMVASTATIN (ZOCOR) 10 MG TABLET    Take by mouth.   VALSARTAN-HYDROCHLOROTHIAZIDE (DIOVAN-HCT) 320-25 MG PER TABLET    Take by mouth.    Review of Systems  Constitutional: Negative for fever, chills and fatigue.  Neurological: Positive for tingling and numbness.     History  Substance Use Topics  . Smoking status: Former Smoker    Quit date: 02/18/2000  . Smokeless tobacco: Not on file  . Alcohol Use: No   Objective:   BP 158/70 mmHg  Pulse 68  Temp(Src) 97.5 F (36.4 C) (Oral)  Resp 16  Wt 160 lb (72.576 kg)  Physical Exam  Right great toe red and inflamed in lateral web space. No drainage. Onychomycosis is noted.     Assessment & Plan:     1. Cellulitis and abscess of foot  - cephALEXin (KEFLEX) 500 MG capsule; Take 1 capsule (500 mg total) by mouth 4 (four) times daily.  Dispense: 40 capsule; Refill: 0  Call if symptoms change or if not rapidly improving.          [name], MD  [facility]

## 2014-09-05 ENCOUNTER — Telehealth: Payer: Self-pay | Admitting: Family Medicine

## 2014-09-05 NOTE — Telephone Encounter (Signed)
Called patient to let her know that we were changing her antibiotic and she says that she does not want to take any more antibiotics. She reports that they all make her sick, and she thinks that she will be fine without it. Instructed patient that her infection may not heal if she does not want to take the meds. She states that she feels much better without taking ANY meds, and prefer that rash heals on its own.

## 2014-09-05 NOTE — Telephone Encounter (Signed)
antibiotic is causing her to have a lot of side effects.  She does not want to take it.  Please advise.  Thanks, Con Memos

## 2014-09-05 NOTE — Telephone Encounter (Signed)
Called pt's daughter Noreene Larsson back to find out what symptoms pt is having. Noreene Larsson stated that after pt took the first dose of cephalexin at dinner yesterday evening, pt starting having dizziness and a foggy feeling. Pt took second dose at bedtime and this morning she is still having dizziness and foggy feeling, along with body aches. Jeanie took pt's Bp=138/77. Please advise?

## 2014-09-05 NOTE — Telephone Encounter (Signed)
Can change to amoxicillin 500mg  one tablet three times a day for 10 days, #30. rf x 0.

## 2014-09-28 ENCOUNTER — Other Ambulatory Visit: Payer: Self-pay | Admitting: Family Medicine

## 2014-10-09 ENCOUNTER — Ambulatory Visit: Payer: Self-pay | Admitting: Family Medicine

## 2014-10-09 ENCOUNTER — Ambulatory Visit (INDEPENDENT_AMBULATORY_CARE_PROVIDER_SITE_OTHER): Payer: Medicare PPO | Admitting: Family Medicine

## 2014-10-09 VITALS — BP 160/70 | HR 68 | Temp 97.6°F | Resp 16 | Wt 157.0 lb

## 2014-10-09 DIAGNOSIS — E669 Obesity, unspecified: Secondary | ICD-10-CM

## 2014-10-09 DIAGNOSIS — I10 Essential (primary) hypertension: Secondary | ICD-10-CM | POA: Diagnosis not present

## 2014-10-09 DIAGNOSIS — E039 Hypothyroidism, unspecified: Secondary | ICD-10-CM | POA: Diagnosis not present

## 2014-10-09 DIAGNOSIS — R7309 Other abnormal glucose: Secondary | ICD-10-CM | POA: Diagnosis not present

## 2014-10-09 DIAGNOSIS — R7303 Prediabetes: Secondary | ICD-10-CM

## 2014-10-09 LAB — POCT GLYCOSYLATED HEMOGLOBIN (HGB A1C)
Est. average glucose Bld gHb Est-mCnc: 120
HEMOGLOBIN A1C: 5.8

## 2014-10-09 NOTE — Progress Notes (Signed)
Patient: [name] [name] Female    DOB: 28-Sep-1926   81 y.o.   MRN: [id number] Visit Date: 10/09/2014  Today's Provider: [name], MD   Chief Complaint  Patient presents with  . Hypertension    follow-up  . Hypothyroidism    follow-up  . Hyperglycemia    follow-up   Subjective:    HPI      Hypertension, follow-up:  BP Readings from Last 3 Encounters:  10/09/14 160/70  09/04/14 158/70  04/10/14 148/82    She was last seen for hypertension 6 months ago.  BP at that visit was  158/60. Management changes since that visit include  none She reports excellent compliance with treatment. She is not having side effects.  She is not exercising. She is adherent to low salt diet.   Outside blood pressures are  [id number] systolic and [id number] diastolic. She is experiencing none.  Patient denies chest pain, chest pressure/discomfort, claudication, dyspnea, exertional chest pressure/discomfort, fatigue, irregular heart beat, lower extremity edema, near-syncope, orthopnea, palpitations, paroxysmal nocturnal dyspnea, syncope and tachypnea.   Cardiovascular risk factors include advanced age (older than 38 for men, 64 for women).  Use of agents associated with hypertension: NSAIDS.     Weight trend: stable Wt Readings from Last 3 Encounters:  10/09/14 157 lb (71.215 kg)  09/04/14 160 lb (72.576 kg)  04/10/14 160 lb (72.576 kg)    Current diet: in general, a "healthy" diet    ------------------------------------------------------------------------   Hyperglycemia, Follow-up:   Lab Results  Component Value Date   HGBA1C 5.8 10/09/2014   HGBA1C 5.8 10/10/2013   GLUCOSE 134* 12/03/2006    Last seen for for this6 months ago.  Management changes included  none. Current symptoms include none and have been stable.  Weight trend: stable Prior visit with dietician: no Current diet: in general, a "healthy" diet   Current exercise: none  Pertinent Labs:      Component Value Date/Time   CHOL 162 04/10/2014   TRIG 134 04/10/2014   CREATININE 1.3* 04/10/2014   CREATININE 1.35* 12/03/2006 1345    Wt Readings from Last 3 Encounters:  10/09/14 157 lb (71.215 kg)  09/04/14 160 lb (72.576 kg)  04/10/14 160 lb (72.576 kg)     Hypothyroidism Follow up: Last office visit was 6 months ago and no changes were made. Current treatment includes Levothyroxine 2mcg daily. Patient reports good compliance with treatment and  good tolerance.    Arthritis Is doing fairly well, but requires a cane to get around. She is having difficulty walking outside from parking spots into stores, and would like a handicap parking permit.   Allergies  Allergen Reactions  . Atorvastatin     myalgias/arthralgias.  . Lovastatin     Other reaction(s): Muscle Pain   Previous Medications   ALLOPURINOL (ZYLOPRIM) 100 MG TABLET    TAKE TWO TABLETS BY MOUTH ONCE DAILY FOR GOUT PREVENTION   ASPIRIN 81 MG TABLET    Take 81 mg by mouth daily.    CALCIUM CARBONATE-VITAMIN D PO    Take 2 tablets by mouth daily.    IBUPROFEN (ADVIL,MOTRIN) 600 MG TABLET    Take by mouth.   LANSOPRAZOLE (PREVACID) 15 MG CAPSULE    Take by mouth.   LEVOTHYROXINE (SYNTHROID, LEVOTHROID) 75 MCG TABLET    Take 75 mcg by mouth daily.    MULTIPLE MINERALS-VITAMINS PO    Take by mouth daily.    MULTIPLE VITAMINS-MINERALS (PRESERVISION AREDS 2  PO)    Take 2 tablets by mouth daily.   SIMVASTATIN (ZOCOR) 10 MG TABLET    Take 10 mg by mouth daily.    VALSARTAN-HYDROCHLOROTHIAZIDE (DIOVAN-HCT) 320-25 MG PER TABLET    Take 1 tablet by mouth daily.     Review of Systems  Constitutional: Negative for fever, chills, appetite change and fatigue.  Respiratory: Negative for chest tightness and shortness of breath.   Cardiovascular: Negative for chest pain and palpitations.  Gastrointestinal: Negative for nausea, vomiting and abdominal pain.  Neurological: Negative for dizziness and weakness.    Social  History  Substance Use Topics  . Smoking status: Former Smoker    Quit date: 02/18/2000  . Smokeless tobacco: Not on file  . Alcohol Use: No   Objective:   BP 160/70 mmHg  Pulse 68  Temp(Src) 97.6 F (36.4 C) (Oral)  Resp 16  Wt 157 lb (71.215 kg)  SpO2 97%  Physical Exam  General Appearance:    Alert, cooperative, no distress, obese  Eyes:    PERRL, conjunctiva/corneas clear, EOM's intact       Lungs:     Clear to auscultation bilaterally, respirations unlabored  Heart:    Regular rate and rhythm  Neurologic:   Awake, alert, oriented x 3. No apparent focal neurological           defect.           Assessment & Plan:     1. Essential hypertension Home BPs much better than in office. Tolerating medications. Continue current medications.   - Renal function panel  2. Prediabetes Well controlled wiith diet alone.  - POCT glycosylated hemoglobin (Hb A1C)  3. Obesity Have some trouble ambulating outside due to hip pain and heat intolerance. Completed handicap parking permit form while in office today.   4. Hypothyroidism, unspecified hypothyroidism type  - TSH   Return in 6 months if labs are [facility].       [name], MD  [facility]

## 2014-10-10 ENCOUNTER — Telehealth: Payer: Self-pay

## 2014-10-10 LAB — RENAL FUNCTION PANEL
ALBUMIN: 4.1 g/dL (ref 3.5–4.7)
BUN / CREAT RATIO: 20 (ref 11–26)
BUN: 28 mg/dL — AB (ref 8–27)
CHLORIDE: 97 mmol/L (ref 97–108)
CO2: 27 mmol/L (ref 18–29)
CREATININE: 1.37 mg/dL — AB (ref 0.57–1.00)
Calcium: 10 mg/dL (ref 8.7–10.3)
GFR calc non Af Amer: 35 mL/min/{1.73_m2} — ABNORMAL LOW (ref >59)
GFR, EST AFRICAN AMERICAN: 41 mL/min/{1.73_m2} — AB (ref >59)
Glucose: 117 mg/dL — ABNORMAL HIGH (ref 65–99)
Phosphorus: 3.2 mg/dL (ref 2.5–4.5)
Potassium: 4.8 mmol/L (ref 3.5–5.2)
Sodium: 139 mmol/L (ref 134–144)

## 2014-10-10 LAB — TSH: TSH: 1.85 u[IU]/mL (ref 0.450–4.500)

## 2014-10-10 NOTE — Telephone Encounter (Signed)
Called pt but pt requested for her to get the information; advised pt's daughter as directed below. Pt's daughter verbalized fully understanding. Transferred call up front for scheduling a 6 month f/u per pt's daughter's request.  Thanks,

## 2014-10-10 NOTE — Telephone Encounter (Signed)
-----   Message from Birdie Sons, MD sent at 10/10/2014  8:01 AM EDT ----- Labs are very good. Continue current medications.  Follow up 6 months.

## 2014-10-16 ENCOUNTER — Ambulatory Visit
Admission: RE | Admit: 2014-10-16 | Discharge: 2014-10-16 | Disposition: A | Discharge status: Home / Self Care | Payer: Medicare PPO | Source: Ambulatory Visit | Attending: Family Medicine | Admitting: Family Medicine

## 2014-10-16 ENCOUNTER — Ambulatory Visit (INDEPENDENT_AMBULATORY_CARE_PROVIDER_SITE_OTHER): Payer: Medicare PPO | Admitting: Family Medicine

## 2014-10-16 ENCOUNTER — Encounter: Payer: Self-pay | Admitting: Family Medicine

## 2014-10-16 VITALS — BP 140/74 | HR 65 | Temp 99.1°F | Resp 16

## 2014-10-16 DIAGNOSIS — M25551 Pain in right hip: Secondary | ICD-10-CM

## 2014-10-16 DIAGNOSIS — M5136 Other intervertebral disc degeneration, lumbar region: Secondary | ICD-10-CM | POA: Diagnosis not present

## 2014-10-16 NOTE — Progress Notes (Signed)
Patient ID: [name] [name], female   DOB: 11-Aug-1926, 81 y.o.   MRN: [id number]       Patient: [name] [name] Female    DOB: 1926/08/16   81 y.o.   MRN: [id number] Visit Date: 10/16/2014  Today's Provider: [name], MD   Chief Complaint  Patient presents with  . Fall    on Saturday 10/14/2014, landed on her right hip and right shoulder, slight pain and took some advil and placed an icepack.   Subjective:    Fall The accident occurred 3 to 5 days ago. The fall occurred while walking (was going to the bathroom at [employer], there was an inclind which she did not see.). She fell from a height of 3 to 5 ft. She landed on hard floor (tile floor). There was no blood loss. The point of impact was the right hip and right shoulder (right hand). The pain is present in the right hip (with activity). The pain is at a severity of 4/10 (no pain at rest, but with activity pain is a 4/10). The symptoms are aggravated by ambulation, standing, flexion, rotation and movement. Pertinent negatives include no fever, headaches, loss of consciousness, nausea, numbness or tingling. She has tried ice and NSAID for the symptoms. The treatment provided mild (ice pack helped) relief.   She was using her cane when she fell. Since fall it is very difficult for her to stand up and can barely walk more than a few feet at a time.      Allergies  Allergen Reactions  . Atorvastatin     myalgias/arthralgias.  . Lovastatin     Other reaction(s): Muscle Pain   Previous Medications   ALLOPURINOL (ZYLOPRIM) 100 MG TABLET    TAKE TWO TABLETS BY MOUTH ONCE DAILY FOR GOUT PREVENTION   ASPIRIN 81 MG TABLET    Take 81 mg by mouth daily.    CALCIUM CARBONATE-VITAMIN D PO    Take 2 tablets by mouth daily.    GARLIC PO    Take 5 mg by mouth daily.   IBUPROFEN (ADVIL,MOTRIN) 600 MG TABLET    Take by mouth.   KRILL OIL 300 MG CAPS    Take 300 mg by mouth daily.   LANSOPRAZOLE (PREVACID) 15 MG CAPSULE    Take  by mouth.   LEVOTHYROXINE (SYNTHROID, LEVOTHROID) 75 MCG TABLET    Take 75 mcg by mouth daily.    MULTIPLE MINERALS-VITAMINS PO    Take by mouth daily.    MULTIPLE VITAMINS-MINERALS (PRESERVISION AREDS 2 PO)    Take 2 tablets by mouth daily.   SIMVASTATIN (ZOCOR) 10 MG TABLET    Take 10 mg by mouth daily.    VALSARTAN-HYDROCHLOROTHIAZIDE (DIOVAN-HCT) 320-25 MG PER TABLET    Take 1 tablet by mouth daily.     Review of Systems  Constitutional: Negative for fever.  Gastrointestinal: Negative for nausea.  Neurological: Negative for tingling, loss of consciousness, numbness and headaches.    Social History  Substance Use Topics  . Smoking status: Former Smoker    Quit date: 02/18/2000  . Smokeless tobacco: Not on file  . Alcohol Use: No   Objective:   BP 140/74 mmHg  Pulse 65  Temp(Src) 99.1 F (37.3 C) (Oral)  Resp 16  SpO2 96%  Physical Exam  General appearance: alert, well developed, well nourished, cooperative and in no distress Head: Normocephalic, without obvious abnormality, atraumatic Lungs: Respirations even and unlabored Extremities: No gross deformities Skin: Skin  color, texture, turgor normal. No rashes seen  Psych: Appropriate mood and affect. Neurologic: Mental status: Alert, oriented to person, place, and time, thought content appropriate. Ortho: Requires assistance to stand and ambulate. Moderate tenderness right lateral hip. Mild bruising, but no significant swelling.     Assessment & Plan:     1. Right hip pain Contusion versus fracture.  - DG HIP UNILAT WITH PELVIS 2-3 VIEWS RIGHT; Future       [name], MD  [facility]

## 2014-12-29 ENCOUNTER — Encounter: Payer: Self-pay | Admitting: Family Medicine

## 2014-12-29 ENCOUNTER — Ambulatory Visit
Admission: RE | Admit: 2014-12-29 | Discharge: 2014-12-29 | Disposition: A | Discharge status: Home / Self Care | Payer: Medicare PPO | Source: Ambulatory Visit | Attending: Family Medicine | Admitting: Family Medicine

## 2014-12-29 ENCOUNTER — Ambulatory Visit (INDEPENDENT_AMBULATORY_CARE_PROVIDER_SITE_OTHER): Payer: Medicare PPO | Admitting: Family Medicine

## 2014-12-29 VITALS — BP 168/72 | HR 72 | Temp 98.3°F | Resp 16 | Wt 158.0 lb

## 2014-12-29 DIAGNOSIS — L98499 Non-pressure chronic ulcer of skin of other sites with unspecified severity: Secondary | ICD-10-CM | POA: Diagnosis not present

## 2014-12-29 DIAGNOSIS — M7989 Other specified soft tissue disorders: Secondary | ICD-10-CM | POA: Insufficient documentation

## 2014-12-29 DIAGNOSIS — L980 Pyogenic granuloma: Secondary | ICD-10-CM

## 2014-12-29 DIAGNOSIS — L03115 Cellulitis of right lower limb: Secondary | ICD-10-CM | POA: Diagnosis not present

## 2014-12-29 DIAGNOSIS — L97519 Non-pressure chronic ulcer of other part of right foot with unspecified severity: Secondary | ICD-10-CM | POA: Insufficient documentation

## 2014-12-29 DIAGNOSIS — L089 Local infection of the skin and subcutaneous tissue, unspecified: Secondary | ICD-10-CM

## 2014-12-29 MED ORDER — DOXYCYCLINE HYCLATE 100 MG PO TABS: 100.0000 mg | ORAL_TABLET | Freq: Two times a day (BID) | ORAL | Status: DC

## 2014-12-29 NOTE — Progress Notes (Signed)
Patient ID: [name] [name], female   DOB: 1926/04/10, 81 y.o.   MRN: [id number]   Chief Complaint  Patient presents with  . Toe Pain   Subjective:  HPI Patient has had trouble with great big right toe pain again. She was seen by Dr.Fisher in July for the same issue and daughter states this time it is worse. Pain, swelling, redness, bad odor present. Patient was up at 3 am this morning with pain, daughter did not know this was an issue until today. She was given Keflex in July but it made her feel dizzy, woozy and then Dr. Caryn Section was going to change it to Amox but patient wanted to wait it out at that time so she did not try another medication for this problem.  Prior to Admission medications   Medication Sig Start Date End Date Taking? Authorizing Provider  allopurinol (ZYLOPRIM) 100 MG tablet TAKE TWO TABLETS BY MOUTH ONCE DAILY FOR GOUT PREVENTION 09/28/14  Yes Birdie Sons, MD  aspirin 81 MG tablet Take 81 mg by mouth daily.  09/26/08  Yes Historical Provider, MD  CALCIUM CARBONATE-VITAMIN D PO Take 2 tablets by mouth daily.  09/26/08  Yes Historical Provider, MD  GARLIC PO Take 5 mg by mouth daily.   Yes Historical Provider, MD  ibuprofen (ADVIL,MOTRIN) 600 MG tablet Take by mouth.   Yes Historical Provider, MD  Javier Docker Oil 300 MG CAPS Take 300 mg by mouth daily.   Yes Historical Provider, MD  lansoprazole (PREVACID) 15 MG capsule Take by mouth.   Yes Historical Provider, MD  levothyroxine (SYNTHROID, LEVOTHROID) 75 MCG tablet Take 75 mcg by mouth daily.  03/26/14  Yes Historical Provider, MD  MULTIPLE MINERALS-VITAMINS PO Take by mouth daily.    Yes Historical Provider, MD  Multiple Vitamins-Minerals (PRESERVISION AREDS 2 PO) Take 2 tablets by mouth daily.   Yes Historical Provider, MD  simvastatin (ZOCOR) 10 MG tablet Take 10 mg by mouth daily.  04/11/14  Yes Historical Provider, MD  valsartan-hydrochlorothiazide (DIOVAN-HCT) 320-25 MG per tablet Take 1 tablet by mouth daily.  04/11/14  Yes  Historical Provider, MD    Patient Active Problem List   Diagnosis Date Noted  . Anemia 08/16/2014  . Chronic kidney disease (CKD), stage III (moderate) 08/16/2014  . Esophageal reflux 08/16/2014  . Obesity 08/16/2014  . Osteopenia 08/16/2014  . Varicose veins 08/16/2014  . Prediabetes 10/02/2008  . CAD in native artery 09/26/2008  . Gout 09/26/2008  . History of tobacco use 09/26/2008  . Hypertension 09/26/2008  . Hypothyroid 09/26/2008  . Hypercholesterolemia without hypertriglyceridemia 09/26/2008   Past Surgical History  Procedure Laterality Date  . Cardiac catheterization    . Abdominal hysterectomy  1995    abdominal-uterine prolapse, 1 ovary removed, bladder tact  . Cholecystectomy    . Tonsillectomy and adenoidectomy     Social History   Social History  . Marital Status: Widowed    Spouse Name: N/A  . Number of Children: N/A  . Years of Education: N/A   Occupational History  . Not on file.   Social History Main Topics  . Smoking status: Former Smoker    Quit date: 02/18/2000  . Smokeless tobacco: Never Used  . Alcohol Use: No  . Drug Use: No  . Sexual Activity: No   Other Topics Concern  . Not on file   Social History Narrative   Allergies  Allergen Reactions  . Atorvastatin     myalgias/arthralgias.  . Lovastatin  Other reaction(s): Muscle Pain   Review of Systems  Constitutional: Negative.   Respiratory: Negative.   Cardiovascular: Negative.   Musculoskeletal: Positive for joint pain.   Immunization History  Administered Date(s) Administered  . Pneumococcal Conjugate-13 04/10/2014  . Pneumococcal Polysaccharide-23 12/28/2008  . Tdap 04/29/2010  . Zoster 04/29/2010   Objective:  BP 168/72 mmHg  Pulse 72  Temp(Src) 98.3 F (36.8 C)  Resp 16  Wt 158 lb (71.668 kg)  Physical Exam  Constitutional: She is oriented to person, place, and time and well-developed, well-nourished, and in no distress.  HENT:  Head: Normocephalic.    Eyes: Conjunctivae are normal.  Neck: Neck supple.  Cardiovascular: Normal rate and regular rhythm.   Pulmonary/Chest: Effort normal and breath sounds normal.  Abdominal: Soft. Bowel sounds are normal.  Musculoskeletal:       Feet:  Good pulses in lower extremities. Many large varicose veins in legs. No local lymphadenopathy.  Neurological: She is oriented to person, place, and time.  Normal sensation of toes and feet.  Skin: There is erythema.  Right forefoot.    Lab Results  Component Value Date   HGB 12.7 12/03/2006   HCT 37.0 12/03/2006   GLUCOSE 117* 10/09/2014   CHOL 162 04/10/2014   TRIG 134 04/10/2014   HDL 48 04/10/2014   LDLCALC 87 04/10/2014   TSH 1.850 10/09/2014   HGBA1C 5.8 10/09/2014   CMP     Component Value Date/Time   NA 139 10/09/2014 0918   NA 138 12/03/2006 1345   K 4.8 10/09/2014 0918   CL 97 10/09/2014 0918   CO2 27 10/09/2014 0918   GLUCOSE 117* 10/09/2014 0918   GLUCOSE 134* 12/03/2006 1345   BUN 28* 10/09/2014 0918   BUN 15 12/03/2006 1345   CREATININE 1.37* 10/09/2014 0918   CREATININE 1.3* 04/10/2014   CALCIUM 10.0 10/09/2014 0918   ALBUMIN 4.1 10/09/2014 0918   ALT 29 04/10/2014   GFRNONAA 35* 10/09/2014 0918   GFRAA 41* 10/09/2014 0918    Assessment and Plan :  1. Infected ulcer of skin, with unspecified severity ([place]) Persistent ulceration with surrounding callus formation to the plantar surface of the right great toe. No purulent discharge or significant discomfort. Good pedal pulses with surrounding erythema. Onset more than 3-4 months ago. No known injury. No history of diabetes. Will get routine labs and x-ray evaluation to rule out osteomyelitis. Also, schedule wound clinic referral. - COMPLETE METABOLIC PANEL WITH GFR - DG Foot Complete Right - Ambulatory referral to Wound Clinic  2. Pyogenic granuloma Onset over the past week or two. No known injury. Tissue very friable and bright red. Will cover with Band-Aid and  schedule referral to wound care clinic.  3. Cellulitis of right foot Initial onset 3-4 months ago. Did not take antibiotics given in July 2016. Denies fever or purulent drainage for ulcer on great toe. Bright erythema of the great toe and fore foot. Will treat with Doxycycline and schedule wound care referral with blood tests. Recheck pending report. - doxycycline (VIBRA-TABS) 100 MG tablet; Take 1 tablet (100 mg total) by mouth 2 (two) times daily.  Dispense: 20 tablet; Refill: 0 - CBC with [facility] 12/29/2014 11:13 AM

## 2014-12-30 LAB — CBC WITH DIFFERENTIAL/PLATELET
BASOS: 0 %
Basophils Absolute: 0.1 10*3/uL (ref 0.0–0.2)
EOS (ABSOLUTE): 0.1 10*3/uL (ref 0.0–0.4)
EOS: 1 %
HEMATOCRIT: 39.2 % (ref 34.0–46.6)
Hemoglobin: 13 g/dL (ref 11.1–15.9)
Immature Grans (Abs): 0 10*3/uL (ref 0.0–0.1)
Immature Granulocytes: 0 %
LYMPHS ABS: 2.2 10*3/uL (ref 0.7–3.1)
Lymphs: 16 %
MCH: 28.9 pg (ref 26.6–33.0)
MCHC: 33.2 g/dL (ref 31.5–35.7)
MCV: 87 fL (ref 79–97)
MONOS ABS: 1.1 10*3/uL — AB (ref 0.1–0.9)
Monocytes: 8 %
Neutrophils Absolute: 10 10*3/uL — ABNORMAL HIGH (ref 1.4–7.0)
Neutrophils: 75 %
Platelets: 185 10*3/uL (ref 150–379)
RBC: 4.5 x10E6/uL (ref 3.77–5.28)
RDW: 14.5 % (ref 12.3–15.4)
WBC: 13.4 10*3/uL — ABNORMAL HIGH (ref 3.4–10.8)

## 2015-01-01 ENCOUNTER — Telehealth: Payer: Self-pay

## 2015-01-01 NOTE — Telephone Encounter (Signed)
Patient advised as directed below. Patient verbalized understanding.  

## 2015-01-01 NOTE — Telephone Encounter (Signed)
-----   Message from Margo Common, [place] sent at 12/29/2014  5:30 PM EST ----- No sign of infection in bone of toe. Proceed with antibiotic and wound clinic referral when scheduled.

## 2015-01-02 ENCOUNTER — Telehealth: Payer: Self-pay

## 2015-01-02 NOTE — Telephone Encounter (Signed)
-----   Message from Margo Common, [place] sent at 01/01/2015  5:56 PM EST ----- X-ray was negative for infection in the bones. WBC count on blood test was elevated as expected with infection in the skin. Take the antibiotic as prescribed and proceed with evaluation at the [facility].

## 2015-01-02 NOTE — Telephone Encounter (Signed)
Patient advised as directed below on 01/01/2015. Documentation is in previous telephone message.

## 2015-01-05 ENCOUNTER — Encounter: Payer: Medicare PPO | Attending: Surgery | Admitting: Surgery

## 2015-01-05 DIAGNOSIS — I129 Hypertensive chronic kidney disease with stage 1 through stage 4 chronic kidney disease, or unspecified chronic kidney disease: Secondary | ICD-10-CM | POA: Diagnosis not present

## 2015-01-05 DIAGNOSIS — D649 Anemia, unspecified: Secondary | ICD-10-CM | POA: Diagnosis not present

## 2015-01-05 DIAGNOSIS — N183 Chronic kidney disease, stage 3 (moderate): Secondary | ICD-10-CM | POA: Diagnosis not present

## 2015-01-05 DIAGNOSIS — I87301 Chronic venous hypertension (idiopathic) without complications of right lower extremity: Secondary | ICD-10-CM | POA: Insufficient documentation

## 2015-01-05 DIAGNOSIS — E7800 Pure hypercholesterolemia, unspecified: Secondary | ICD-10-CM | POA: Diagnosis not present

## 2015-01-05 DIAGNOSIS — E669 Obesity, unspecified: Secondary | ICD-10-CM | POA: Insufficient documentation

## 2015-01-05 DIAGNOSIS — L97512 Non-pressure chronic ulcer of other part of right foot with fat layer exposed: Secondary | ICD-10-CM | POA: Diagnosis present

## 2015-01-05 DIAGNOSIS — I2510 Atherosclerotic heart disease of native coronary artery without angina pectoris: Secondary | ICD-10-CM | POA: Insufficient documentation

## 2015-01-05 DIAGNOSIS — G603 Idiopathic progressive neuropathy: Secondary | ICD-10-CM | POA: Insufficient documentation

## 2015-01-05 DIAGNOSIS — M109 Gout, unspecified: Secondary | ICD-10-CM | POA: Diagnosis not present

## 2015-01-05 DIAGNOSIS — Z87891 Personal history of nicotine dependence: Secondary | ICD-10-CM | POA: Insufficient documentation

## 2015-01-05 DIAGNOSIS — F419 Anxiety disorder, unspecified: Secondary | ICD-10-CM | POA: Diagnosis not present

## 2015-01-05 DIAGNOSIS — M1990 Unspecified osteoarthritis, unspecified site: Secondary | ICD-10-CM | POA: Insufficient documentation

## 2015-01-06 NOTE — Progress Notes (Signed)
Gordan, Junella L. ([id number]) Visit Report for 01/05/2015 Chief Complaint Document Details Patient Name: [name] [name], [name] [name] 01/05/2015 12:45 Date of Service: PM Medical Record [id number] Number: Patient Account Number: [id number] 11-07-1926 (81 y.o. Treating RN: Baruch Gouty, RN, BSN, Velva Harman Date of Birth/Sex: Female) Other Clinician: Primary Care Physician: [name] Treating Andrick Rust Referring Physician: Golden Pop Physician/Extender: Suella Grove in Treatment: 0 Information Obtained from: Patient Chief Complaint Patient presents to the wound care center for a consult due non healing wound. She has had a nonhealing wound on the right great toe since July of this year Electronic Signature(s) Signed: 01/05/2015 2:34:41 PM By: Christin Fudge MD, FACS Entered By: Christin Fudge on 01/05/2015 14:34:40 Idler, Saida L. ([id number]) -------------------------------------------------------------------------------- Debridement Details Patient Name: [name] [name], [name] [name] 01/05/2015 12:45 Date of Service: PM Medical Record [id number] Number: Patient Account Number: [id number] December 26, 1926 (81 y.o. Treating RN: Baruch Gouty, RN, BSN, Velva Harman Date of Birth/Sex: Female) Other Clinician: Primary Care Physician: [name] Treating Michaiah Holsopple Referring Physician: Golden Pop Physician/Extender: Suella Grove in Treatment: 0 Debridement Performed for Wound #1 Right Toe Great Assessment: Performed By: Physician Christin Fudge, MD Debridement: Debridement Pre-procedure Yes Verification/Time Out Taken: Start Time: 13:28 Pain Control: Lidocaine 4% Topical Solution Level: Skin/Subcutaneous Tissue Total Area Debrided (L x 2 (cm) x 3.2 (cm) = 6.4 (cm) W): Tissue and other Non-Viable, Callus, Exudate, Fibrin/Slough, Subcutaneous material debrided: Instrument: Curette Bleeding: Moderate Hemostasis Achieved: Silver Nitrate End Time: 13:37 Procedural Pain: 0 Post Procedural Pain: 0 Response to  Treatment: Procedure was tolerated well Post Debridement Measurements of Total Wound Length: (cm) 2 Width: (cm) 3.2 Depth: (cm) 0.2 Volume: (cm) 1.005 Post Procedure Diagnosis Same as Pre-procedure Electronic Signature(s) Signed: 01/05/2015 2:34:17 PM By: Christin Fudge MD, FACS Signed: 01/05/2015 4:12:24 PM By: Regan Lemming BSN, RN Previous Signature: 01/05/2015 1:37:15 PM Version By: Regan Lemming BSN, RN Entered By: Christin Fudge on 01/05/2015 14:34:17 Mcguinn, Delitha L. ([id number]) [name] [name], [name] ([id number]) -------------------------------------------------------------------------------- HPI Details Patient Name: [name] [name], [name] [name] 01/05/2015 12:45 Date of Service: PM Medical Record [id number] Number: Patient Account Number: [id number] 06/14/1926 (81 y.o. Treating RN: Baruch Gouty, RN, BSN, Velva Harman Date of Birth/Sex: Female) Other Clinician: Primary Care Physician: [name] Treating Shanikia Kernodle Referring Physician: Golden Pop Physician/Extender: Suella Grove in Treatment: 0 History of Present Illness Location: ulcer on the right big toe Quality: Patient reports experiencing a dull pain to affected area(s). Severity: Patient states wound are getting worse. Duration: Patient has had the wound for > 3 months prior to seeking treatment at the wound center Timing: Pain in wound is Intermittent (comes and goes Context: The wound appeared gradually over time Modifying Factors: Other treatment(s) tried include: has been treated with doxycycline and prior to that Keflex Associated Signs and Symptoms: Patient reports having difficulty standing for long periods. HPI Description: 81 year old patient was recently seen by the PCPs office for significant pain right great toe which has been going on since July. She was initially treated with Keflex which she did not complete and after the office visit this time she has been put on doxycycline. at the time of her visit she was found to have  a ulcer on the plantar surface of the right great toe and also had a pyogenic granuloma over this area. X-ray of the right foot done 12/29/2014 -- IMPRESSION:Soft-tissue swelling and ulceration right great toe. No underlying bony lytic lesion identified. If osteomyelitis remains of clinical concern MRI can be obtained. Past medical history significant for anemia, chronic kidney disease stage III, obesity, varicose veins, coronary artery  disease, gout, history of nicotine addiction given up smoking in 2002, hypertension, status post cardiac catheterization, status post abdominal hysterectomy, cholecystectomy and tonsillectomy. hemoglobin A1c done in August was 5.8 Electronic Signature(s) Signed: 01/05/2015 2:35:54 PM By: Christin Fudge MD, FACS Previous Signature: 01/05/2015 1:13:20 PM Version By: Christin Fudge MD, FACS Previous Signature: 01/05/2015 1:09:40 PM Version By: Christin Fudge MD, FACS Entered By: Christin Fudge on 01/05/2015 14:35:54 [name] [name], [name] ([id number]) -------------------------------------------------------------------------------- Physical Exam Details Patient Name: [name] [name], [name] [name] 01/05/2015 12:45 Date of Service: PM Medical Record [id number] Number: Patient Account Number: [id number] 1926/11/19 (81 y.o. Treating RN: Baruch Gouty, RN, BSN, Velva Harman Date of Birth/Sex: Female) Other Clinician: Primary Care Physician: [name] Treating Electra Paladino Referring Physician: Golden Pop Physician/Extender: Weeks in Treatment: 0 Constitutional . Pulse regular. Respirations normal and unlabored. Afebrile. . Eyes Nonicteric. Reactive to light. Ears, Nose, Mouth, and Throat Lips, teeth, and gums WNL.Marland Kitchen Moist mucosa without lesions . Neck supple and nontender. No palpable supraclavicular or cervical adenopathy. Normal sized without goiter. Respiratory WNL. No retractions.. Cardiovascular Pedal Pulses WNL. she had noncompressible ABIs both lower extremity. No clubbing,  cyanosis or edema. Gastrointestinal (GI) Abdomen without masses or tenderness.. No liver or spleen enlargement or tenderness.. Lymphatic No adneopathy. No adenopathy. No adenopathy. Musculoskeletal Adexa without tenderness or enlargement.. Digits and nails w/o clubbing, cyanosis, infection, petechiae, ischemia, or inflammatory conditions.. Integumentary (Hair, Skin) No suspicious lesions. No crepitus or fluctuance. No peri-wound warmth or erythema. No masses.Marland Kitchen Psychiatric Judgement and insight Intact.. No evidence of depression, anxiety, or agitation.. Notes the patient has a significant callus and ulceration on the plantar aspect of the right big toe and this communicates medially with a large area which has hyper granulation tissue and looks like a pyogenic granuloma. There is no cellulitis or pus Electronic Signature(s) Signed: 01/05/2015 2:37:10 PM By: Christin Fudge MD, FACS Entered By: Christin Fudge on 01/05/2015 14:37:10 [name] [name], [name] ([id number]) -------------------------------------------------------------------------------- Physician Orders Details Patient Name: [name] [name], [name] [name] 01/05/2015 12:45 Date of Service: PM Medical Record [id number] Number: Patient Account Number: [id number] September 09, 1926 (81 y.o. Treating RN: Baruch Gouty, RN, BSN, Velva Harman Date of Birth/Sex: Female) Other Clinician: Primary Care Physician: [name] Treating Izaiah Tabb Referring Physician: Golden Pop Physician/Extender: Suella Grove in Treatment: 0 Verbal / Phone Orders: Yes Clinician: Afful, RN, BSN, Rita Read Back and Verified: Yes Diagnosis Coding Wound Cleansing Wound #1 Right Toe Great o Cleanse wound with mild soap and water o May Shower, gently pat wound dry prior to applying new dressing. o May shower with protection. Primary Wound Dressing Wound #1 Right Toe Great o Aquacel Ag Secondary Dressing Wound #1 Right Toe Great o Gauze and Kerlix/Conform Dressing Change  Frequency Wound #1 Right Toe Great o Change dressing every day. - for 1 week till 11/25 o Change dressing every other day. - after 11/25 Follow-up Appointments Wound #1 Right Toe Great o Return Appointment in 2 weeks. - the monday after thanksgiving Off-Loading Wound #1 Right Toe Great o Open toe surgical shoe with peg assist. Medications-please add to medication list. Wound #1 Right Toe Great o P.O. Antibiotics - Doxycycline for 2 more weeks Patient Medications [name] [name], [name] ([id number]) Allergies: no known allergies Notifications Medication Indication Start End doxycycline hyclate 01/05/2015 DOSE 1 - oral 100 mg capsule - 1 capsule oral PO BID Electronic Signature(s) Signed: 01/05/2015 1:50:34 PM By: Christin Fudge MD, FACS Previous Signature: 01/05/2015 1:41:49 PM Version By: Regan Lemming BSN, RN Entered By: Christin Fudge on 01/05/2015 13:50:34 Anastasi, Shanautica L. ([id number]) -------------------------------------------------------------------------------- Problem List  Details Patient Name: [name] [name], [name] [name] 01/05/2015 12:45 Date of Service: PM Medical Record [id number] Number: Patient Account Number: [id number] [birth year] (81 y.o. 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 ([id number]) -------------------------------------------------------------------------------- Progress Note Details Patient Name: [name] [name], [name] [name] 01/05/2015 12:45 Date of Service: PM Medical Record [id number] Number: Patient Account Number: [id number] 1926-10-08 (81 y.o. Treating RN: Baruch Gouty, RN, BSN, Velva Harman Date of Birth/Sex: Female) Other Clinician: Primary Care Physician: [name] Treating Lovie Zarling Referring Physician: Golden Pop Physician/Extender: Suella Grove in Treatment: 0 Subjective Chief Complaint Information obtained from Patient Patient presents to the wound care center for a consult due non healing wound. She has had a nonhealing wound on the right great toe since July of this year History of Present Illness (HPI) The following HPI elements were documented for the patient's wound: Location: ulcer on the right big toe Quality: Patient reports experiencing a dull pain to affected area(s). Severity: Patient states wound are getting worse. Duration: Patient has had the wound for > 3 months prior to seeking treatment at the wound center Timing: Pain in wound is Intermittent (comes and goes Context: The wound appeared gradually over time Modifying Factors: Other treatment(s) tried include: has been treated with doxycycline and prior to that Keflex Associated Signs and Symptoms: Patient reports having difficulty standing for long periods. 81 year old patient was recently seen by the PCPs office for significant pain right great toe which has been going on since July. She was initially treated with Keflex which she did not complete and after the office visit this time she has been put on doxycycline. at the time of her visit she was found to have a ulcer on the plantar surface of the right great toe and also had a pyogenic granuloma over this area. X-ray of the right foot done  12/29/2014 -- IMPRESSION:Soft-tissue swelling and ulceration right great toe. No underlying bony lytic lesion identified. If osteomyelitis remains of clinical concern MRI can be obtained. Past medical history significant for anemia, chronic kidney disease stage III, obesity, varicose veins, coronary artery disease, gout, history of nicotine addiction given up smoking in 2002, hypertension, status post cardiac catheterization, status post abdominal hysterectomy, cholecystectomy and tonsillectomy. hemoglobin A1c done in August was 5.8 Wound History Patient presents with 1 open wound that has been present for approximately since july. Patient has been treating wound in the following manner: dry. Laboratory tests have not been performed in the last month. Patient reportedly has not tested positive for an antibiotic resistant organism. Patient reportedly has not tested positive for osteomyelitis. Patient reportedly has not had testing performed to evaluate circulation in the legs. Patient experiences the following problems associated with their wounds: swelling. [name] [name], Sudiksha L. ([id number]) Patient History Information obtained from Patient, Caregiver, Chart. Allergies no known allergies Family History Cancer - Child, Mother, Heart Disease - Siblings, Hypertension - Siblings, Child, No family history of Diabetes, Hereditary Spherocytosis, Kidney Disease, Lung  Disease, Seizures, Stroke, Thyroid Problems, Tuberculosis. Social History Former smoker - quit sine 2003, Marital Status - Widowed, Alcohol Use - Never, Drug Use - No History, Caffeine Use - Daily. Medical History Eyes Patient has history of Cataracts Ear/Nose/Mouth/Throat Denies history of Chronic sinus problems/congestion, Middle ear problems Hematologic/Lymphatic Denies history of Anemia, Hemophilia, Human Immunodeficiency Virus, Lymphedema, Sickle Cell Disease Respiratory Denies history of Aspiration, Asthma, Chronic Obstructive  Pulmonary Disease (COPD), Pneumothorax, Sleep Apnea, Tuberculosis Cardiovascular Patient has history of Hypertension Gastrointestinal Denies history of Cirrhosis , Colitis, Crohn s, Hepatitis A, Hepatitis B, Hepatitis C Endocrine Denies history of Type I Diabetes, Type II Diabetes Genitourinary Denies history of End Stage Renal Disease Immunological Denies history of Lupus Erythematosus, Raynaud s, Scleroderma Integumentary (Skin) Denies history of History of Burn, History of pressure wounds Musculoskeletal Patient has history of Gout, Osteoarthritis Neurologic Denies history of Dementia, Neuropathy, Quadriplegia, Paraplegia, Seizure Disorder Oncologic Denies history of Received Chemotherapy, Received Radiation Medical And Surgical History Notes Cardiovascular hypothyroidsm, high cholesterol Review of Systems (ROS) Sharps, Rosamund L. ([id number]) Constitutional Symptoms (General Health) The patient has no complaints or symptoms. Eyes Complains or has symptoms of Vision Changes, Glasses / Contacts. Ear/Nose/Mouth/Throat hard of hearing Hematologic/Lymphatic The patient has no complaints or symptoms. Respiratory The patient has no complaints or symptoms. Cardiovascular The patient has no complaints or symptoms. Gastrointestinal The patient has no complaints or symptoms. Endocrine The patient has no complaints or symptoms. Genitourinary The patient has no complaints or symptoms. Immunological The patient has no complaints or symptoms. Integumentary (Skin) Complains or has symptoms of Wounds, Swelling. Musculoskeletal The patient has no complaints or symptoms. Neurologic The patient has no complaints or symptoms. Oncologic The patient has no complaints or symptoms. Psychiatric Complains or has symptoms of Anxiety. Medications Calcium 600 + D(3) oral 1 1 TABLET oral PreserVision oral 1 1 tablet oral for daily aspirin 81 mg tablet,delayed release oral 1 1  tablet,delayed release (DR/EC) oral valsartan 320 mg-hydrochlorothiazide 25 mg tablet oral 1 1 tablet oral simvastatin 10 mg tablet oral 1 1 tablet oral allopurinol 100 mg tablet oral 1 1 tablet oral multivitamin tablet oral 1 1 tablet oral Advil 200 mg tablet oral 1 1 tablet oral doxycycline hyclate 100 mg capsule oral 1 1 capsule oral PO BID doxycycline hyclate 100 mg tablet oral 1 1 tablet oral levothyroxine 75 mcg tablet oral 1 1 tablet oral Oros, Kameria L. ([id number]) Objective Constitutional Pulse regular. Respirations normal and unlabored. Afebrile. Vitals Time Taken: 1:00 PM, Height: 58 in, Source: Stated, Weight: 155 lbs, Source: Measured, BMI: 32.4, Temperature: 98.0 F, Pulse: 66 bpm, Respiratory Rate: 17 breaths/min, Blood Pressure: 144/55 mmHg. Eyes Nonicteric. Reactive to light. Ears, Nose, Mouth, and Throat Lips, teeth, and gums WNL.Marland Kitchen Moist mucosa without lesions . Neck supple and nontender. No palpable supraclavicular or cervical adenopathy. Normal sized without goiter. Respiratory WNL. No retractions.. Cardiovascular Pedal Pulses WNL. she had noncompressible ABIs both lower extremity. No clubbing, cyanosis or edema. Gastrointestinal (GI) Abdomen without masses or tenderness.. No liver or spleen enlargement or tenderness.. Lymphatic No adneopathy. No adenopathy. No adenopathy. Musculoskeletal Adexa without tenderness or enlargement.. Digits and nails w/o clubbing, cyanosis, infection, petechiae, ischemia, or inflammatory conditions.Marland Kitchen Psychiatric Judgement and insight Intact.. No evidence of depression, anxiety, or agitation.. General Notes: the patient has a significant callus and ulceration on the plantar aspect of the right big toe and this communicates medially with a large area which has hyper granulation tissue and looks like a pyogenic granuloma. There is no cellulitis  or pus Integumentary (Hair, Skin) No suspicious lesions. No crepitus or fluctuance.  No peri-wound warmth or erythema. No masses.. Wound #1 status is Open. Original cause of wound was Gradually Appeared. The wound is located on the Right Toe Great. The wound measures 2cm length x 3.2cm width x 0.2cm depth; 5.027cm^2 area and 1.005cm^3 volume. The wound is limited to skin breakdown. There is no tunneling or undermining noted. There is a medium amount of serosanguineous drainage noted. The wound margin is distinct with the Hancox, [place] ([id number]) outline attached to the wound base. There is medium (34-66%) pink granulation within the wound bed. There is a medium (34-66%) amount of necrotic tissue within the wound bed including Adherent Slough. The periwound skin appearance exhibited: Moist. The periwound skin appearance did not exhibit: Callus, Crepitus, Excoriation, Fluctuance, Friable, Induration, Localized Edema, Rash, Scarring, Dry/Scaly, Maceration, Atrophie Blanche, Cyanosis, Ecchymosis, Hemosiderin Staining, Mottled, Pallor, Rubor, Erythema. Periwound temperature was noted as No Abnormality. 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 She has a neuropathic ulcer on the plantar aspect of her right foot and the sensation though not completely lost is diminished. This has been a chronic wound not treated appropriately and though and x-rays negative osteomyelitis, I suspect as longstanding infection has been present. I have asked for packing with Aquacel Ag rope and appropriate foam padding and off loading with an Pegasys shoe. He is very unsteady with her gait and a total contact cast may be difficult. I will continue  with doxycycline for the next 2 weeks and see her back soon after the Thanksgiving holiday. Discussions had with the patient and her daughters at the bedside and they will be compliant. Procedures Wound #1 Wound #1 is a To be determined located on the Right Toe Great . There was a Skin/Subcutaneous Tissue Debridement [id number]) debridement with total area of 6.4 sq cm performed by Christin Fudge, MD. with the following instrument(s): Curette to remove Non-Viable tissue/material including Exudate, Fibrin/Slough, Callus, and Subcutaneous after achieving pain control using Lidocaine 4% Topical Solution. A time out was conducted prior to the start of the procedure. A Moderate amount of bleeding was controlled with Silver Nitrate. The procedure was tolerated well with a pain level of 0 throughout and a pain level of 0 following the procedure. Post Debridement Measurements: 2cm length x 3.2cm width x 0.2cm depth; 1.005cm^3 volume. Post procedure Diagnosis Wound #1: Same as Pre-Procedure Wall, Katlynn L. ([id number]) Plan Wound Cleansing: Wound #1 Right Toe Great: Cleanse wound with mild soap and water May Shower, gently pat wound dry prior to applying new dressing. May shower with protection. Primary Wound Dressing: Wound #1 Right Toe Great: Aquacel Ag Secondary Dressing: Wound #1 Right Toe Great: Gauze and Kerlix/Conform Dressing Change Frequency: Wound #1 Right Toe Great: Change dressing every day. - for 1 week till 11/25 Change dressing every other day. - after 11/25 Follow-up Appointments: Wound #1 Right Toe Great: Return Appointment in 2 weeks. - the monday after thanksgiving Off-Loading: Wound #1 Right Toe Great: Open toe surgical shoe with peg assist. Medications-please add to medication list.: Wound #1 Right Toe Great: P.O. Antibiotics - Doxycycline for 2 more weeks The following medication(s) was prescribed: doxycycline hyclate  oral 100 mg capsule 1 1 capsule oral PO BID  starting 01/05/2015 This patient who is not a known diabetic has a remote history of varicose veins which never have been treated all her life. She has a neuropathic ulcer on the plantar aspect of her right foot and the sensation though not completely lost is diminished. This has been a chronic wound not treated appropriately and though and x-rays negative osteomyelitis, I suspect as longstanding infection has been present. Blackson, Mariea L. ([id number]) I have asked for packing with Aquacel Ag rope and appropriate foam padding and off loading with an Pegasys shoe. He is very unsteady with her gait and a total contact cast may be difficult. I will continue with doxycycline for the next 2 weeks and see her back soon after the Thanksgiving holiday. Discussions had with the patient and her daughters at the bedside and they will be compliant. Electronic Signature(s) Signed: 01/05/2015 2:39:46 PM By: Christin Fudge MD, FACS Entered By: Christin Fudge on 01/05/2015 14:39:45 Yero, Yerlin L. ([id number]) -------------------------------------------------------------------------------- ROS/PFSH Details Patient Name: RHILYNN, MATTIA 01/05/2015 12:45 Date of Service: PM Medical Record [id number] Number: Patient Account Number: [id number] [birth year] (81 y.o. Treating RN: Afful, RN, BSN, Velva Harman Date of Birth/Sex: Female) Other Clinician: Primary Care Physician: [name] Treating Iris Tatsch Referring Physician: Golden Pop Physician/Extender: Suella Grove in Treatment: 0 Information Obtained From Patient Caregiver Chart Wound History Do you currently have one or more open woundso Yes How many open wounds do you currently haveo 1 Approximately how long have you had your woundso since july How have you been treating your wound(s) until nowo dry Has your wound(s) ever healed and then re-openedo No Have you had any lab work done in the past montho No Have you tested positive for an  antibiotic resistant organism (MRSA, VRE)o No Have you tested positive for osteomyelitis (bone infection)o No Have you had any tests for circulation on your legso No Have you had other problems associated with your woundso Swelling Eyes Complaints and Symptoms: Positive for: Vision Changes; Glasses / Contacts Medical History: Positive for: Cataracts Integumentary (Skin) Complaints and Symptoms: Positive for: Wounds; Swelling Medical History: Negative for: History of Burn; History of pressure wounds Psychiatric Complaints and Symptoms: Positive for: Anxiety Constitutional Symptoms (General Health) Complaints and Symptoms: No Complaints or Symptoms Menger, Birdia L. ([id number]) Ear/Nose/Mouth/Throat Complaints and Symptoms: Review of System Notes: hard of hearing Medical History: Negative for: Chronic sinus problems/congestion; Middle ear problems Hematologic/Lymphatic Complaints and Symptoms: No Complaints or Symptoms Medical History: Negative for: Anemia; Hemophilia; Human Immunodeficiency Virus; Lymphedema; Sickle Cell Disease Respiratory Complaints and Symptoms: No Complaints or Symptoms Medical History: Negative for: Aspiration; Asthma; Chronic Obstructive Pulmonary Disease (COPD); Pneumothorax; Sleep Apnea; Tuberculosis Cardiovascular Complaints and Symptoms: No Complaints or Symptoms Medical History: Positive for: Hypertension Past Medical History Notes: hypothyroidsm, high cholesterol Gastrointestinal Complaints and Symptoms: No Complaints or Symptoms Medical History: Negative for: Cirrhosis ; Colitis; Crohnos; Hepatitis A; Hepatitis B; Hepatitis C Endocrine Complaints and Symptoms: No Complaints or Symptoms Medical History: Negative for: Type I Diabetes; Type II Diabetes Dougal, Tanish L. ([id number]) Genitourinary Complaints and Symptoms: No Complaints or Symptoms Medical History: Negative for: End Stage Renal Disease Immunological Complaints and  Symptoms: No Complaints or Symptoms Medical History: Negative for: Lupus Erythematosus; Raynaudos; Scleroderma Musculoskeletal Complaints and Symptoms: No Complaints or Symptoms Medical History: Positive for: Gout; Osteoarthritis Neurologic Complaints and Symptoms: No Complaints or Symptoms Medical History: Negative for: Dementia; Neuropathy; Quadriplegia; Paraplegia; Seizure Disorder Oncologic Complaints and Symptoms: No Complaints  or Symptoms Medical History: Negative for: Received Chemotherapy; Received Radiation HBO Extended History Items Eyes: Cataracts Family and Social History Cancer: Yes - Child, Mother; Diabetes: No; Heart Disease: Yes - Siblings; Hereditary Spherocytosis: No; Hypertension: Yes - Siblings, Child; Kidney Disease: No; Lung Disease: No; Seizures: No; Stroke: No; Thyroid Problems: No; Tuberculosis: No; Former smoker - quit sine 2003; Marital Status - Widowed; Alcohol Use: Never; Drug Use: No History; Caffeine Use: Daily; Financial Concerns: No; Food, Clothing or Shelter Needs: No; Support System Lacking: No; Transportation Concerns: No; Advanced Directives: No; Patient does not want information on Advanced Directives; Living Will: No Billing, Jessly L. ([id number]) Physician Affirmation I have reviewed and agree with the above information. Electronic Signature(s) Signed: 01/05/2015 1:20:47 PM By: Christin Fudge MD, FACS Signed: 01/05/2015 4:12:24 PM By: Regan Lemming BSN, RN Previous Signature: 01/05/2015 1:05:51 PM Version By: Regan Lemming BSN, RN Entered By: Christin Fudge on 01/05/2015 13:20:47 Escorcia, Clorene L. ([id number]) -------------------------------------------------------------------------------- SuperBill Details Patient Name: Borunda, Glena L. Date of Service: 01/05/2015 Medical Record Patient Account Number: [id number] Number: Afful, RN, BSN, Treating RN: 12-19-1926 (81 y.o. 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 I87.301 Chronic venous hypertension (idiopathic) without complications of right lower extremity Facility Procedures CPT4: Description Modifier Quantity Code [id number] - WOUND CARE VISIT-LEV 3 EST PT 1 CPT4: [id number] - DEB SUBQ TISSUE 20 SQ CM/< 1 ICD-10 Description Diagnosis L97.512 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 ([id number]) Electronic Signature(s) Signed: 01/05/2015 2:40:12 PM By: Christin Fudge MD, FACS Entered By: Christin Fudge on 01/05/2015 14:40:12

## 2015-01-06 NOTE — Progress Notes (Signed)
Manukyan, [name] ([id number]) Visit Report for 01/05/2015 Allergy List Details Patient Name: [name] [name], [name] Date of Service: 01/05/2015 12:45 PM Medical Record Number: [id number] Patient Account Number: [id number] Date of Birth/Sex: 1926/05/04 (81 y.o. Female) Treating RN: [name] Gouty, RN, [name], [name] [name] Primary Care Physician: [name] [name] Other Clinician: Referring Physician: Golden [name] Treating Physician/Extender: [name] [name] in Treatment: 0 Allergies Active Allergies no known allergies Allergy Notes Electronic Signature(s) Signed: 01/05/2015 1:01:17 PM By: [name] [name] BSN, RN Entered By: [name] [name] on 01/05/2015 13:01:17 [name] [name], [name] [name] ([id number]) -------------------------------------------------------------------------------- Arrival Information Details Patient Name: Zimny, Niema L. Date of Service: 01/05/2015 12:45 PM Medical Record Number: [id number] Patient Account Number: [id number] Date of Birth/Sex: 1926-07-09 (81 y.o. Female) Treating RN: [name] Gouty, RN, [name], [name] [name] Primary Care Physician: [name] [name] Other Clinician: Referring Physician: Golden [name] Treating Physician/Extender: [name] [name] in Treatment: 0 Visit Information Patient Arrived: [name] [name] Time: 12:55 Accompanied By: DTR Transfer Assistance: None Patient Identification Verified: Yes Secondary Verification Process Completed: Yes Patient Requires Transmission-Based No Precautions: Patient Has Alerts: No Electronic Signature(s) Signed: 01/05/2015 12:55:45 PM By: [name] [name] BSN, RN Entered By: [name] [name] on 01/05/2015 12:55:45 [name] [name], [name] ([id number]) -------------------------------------------------------------------------------- Clinic Level of Care Assessment Details Patient Name: Pries, Trecia L. Date of Service: 01/05/2015 12:45 PM Medical Record Number: [id number] Patient Account Number: [id number] Date of Birth/Sex: [birth year] (81 y.o. Female) Treating RN:  [name] [name], [name] [name] Primary Care Physician: [name] [name] Other Clinician: Referring Physician: Golden [name] Treating Physician/Extender: [name] [name] in Treatment: 0 Clinic Level of Care Assessment Items TOOL 1 Quantity Score []  - Use when EandM and Procedure is performed on INITIAL visit 0 ASSESSMENTS - Nursing Assessment / Reassessment X - General Physical Exam (combine w/ comprehensive assessment (listed just 1 20 below) when performed on new pt. evals) X - Comprehensive Assessment (HX, ROS, Risk Assessments, Wounds Hx, etc.) 1 25 ASSESSMENTS - Wound and Skin Assessment / Reassessment []  - Dermatologic / Skin Assessment (not related to wound area) 0 ASSESSMENTS - Ostomy and/or Continence Assessment and Care []  - Incontinence Assessment and Management 0 []  - Ostomy Care Assessment and Management (repouching, etc.) 0 PROCESS - Coordination of Care X - Simple Patient / Family Education for ongoing care 1 15 []  - Complex (extensive) Patient / Family Education for ongoing care 0 X - Staff obtains Programmer, systems, Records, Test Results / Process Orders 1 10 []  - Staff telephones HHA, Nursing Homes / Clarify orders / etc 0 []  - Routine Transfer to another Facility (non-emergent condition) 0 []  - Routine Hospital Admission (non-emergent condition) 0 X - New Admissions / Biomedical engineer / Ordering NPWT, Apligraf, etc. 1 15 []  - Emergency Hospital Admission (emergent condition) 0 PROCESS - Special Needs []  - Pediatric / Minor Patient Management 0 []  - Isolation Patient Management 0 Ebert, Tomasita L. ([id number]) []  - Hearing / Language / Visual special needs 0 []  - Assessment of Community assistance (transportation, D/C planning, etc.) 0 []  - Additional assistance / Altered mentation 0 []  - Support Surface(s) Assessment (bed, cushion, seat, etc.) 0 INTERVENTIONS - Miscellaneous []  - External ear exam 0 []  - Patient Transfer (multiple staff / Civil Service fast streamer / Similar devices)  0 []  - Simple Staple / Suture removal (25 or less) 0 []  - Complex Staple / Suture removal (26 or more) 0 []  - Hypo/Hyperglycemic Management (do not check if billed separately) 0 X - Ankle / Brachial Index (ABI) - do not check if billed separately 1 15 Has the patient been seen  at the hospital within the last three years: Yes Total Score: 100 Level Of Care: New/Established - Level 3 Electronic Signature(s) Signed: 01/05/2015 1:56:26 PM By: [name] [name] BSN, RN Entered By: [name] [name] on 01/05/2015 13:56:26 [name] [name], [name] ([id number]) -------------------------------------------------------------------------------- Encounter Discharge Information Details Patient Name: [name] [name], [name] Date of Service: 01/05/2015 12:45 PM Medical Record Number: [id number] Patient Account Number: [id number] Date of Birth/Sex: 27-Mar-1926 (81 y.o. Female) Treating RN: [name] Gouty, RN, [name], [name] [name] Primary Care Physician: [name] [name] Other Clinician: Referring Physician: Golden [name] Treating Physician/Extender: [name] [name] in Treatment: 0 Encounter Discharge Information Items Discharge Pain Level: 0 Discharge Condition: Stable Ambulatory Status: Walker Discharge Destination: Home Transportation: Private Auto Accompanied By: dtr Schedule Follow-up Appointment: No Medication Reconciliation completed and provided to Patient/Care No [name] [name]: Provided on Clinical Summary of Care: 01/05/2015 Form Type Recipient Paper Patient IM Electronic Signature(s) Signed: 01/05/2015 1:58:04 PM By: [name] [name] Previous Signature: 01/05/2015 1:55:19 PM Version By: [name] [name] BSN, RN Entered By: [name] [name] on 01/05/2015 13:58:04 [name] [name], [name] ([id number]) -------------------------------------------------------------------------------- Lower Extremity Assessment Details Patient Name: [name] [name], [name] Date of Service: 01/05/2015 12:45 PM Medical Record Number: [id number] Patient Account Number:  [id number] Date of Birth/Sex: 04-16-1926 (81 y.o. Female) Treating RN: [name] [name], [name] [name] Primary Care Physician: [name] [name] Other Clinician: Referring Physician: Golden [name] Treating Physician/Extender: [name] [name] in Treatment: 0 Vascular Assessment Pulses: Posterior Tibial Palpable: [Left:Yes] [Right:Yes] Dorsalis Pedis Palpable: [Left:Yes] [Right:Yes] Extremity colors, hair growth, and conditions: Extremity Color: [Left:Normal] [Right:Normal] Hair Growth on Extremity: [Left:Yes] [Right:Yes] Temperature of Extremity: [Left:Warm] [Right:Warm] Capillary Refill: [Left:< 3 seconds] [Right:< 3 seconds] Blood Pressure: Brachial: [Left:144] Dorsalis Pedis: 160 [Left:Dorsalis Pedis: 170] Ankle: Posterior Tibial: 160 [Left:Posterior Tibial: 140 1.11] [Right:1.18] Toe Nail Assessment Left: Right: Thick: Yes Yes Discolored: No No Deformed: No No Improper Length and Hygiene: No No Electronic Signature(s) Signed: 01/05/2015 1:16:16 PM By: [name] [name] BSN, RN Previous Signature: 01/05/2015 1:15:59 PM Version By: [name] [name] BSN, RN Entered By: [name] [name] on 01/05/2015 13:16:16 [name] [name], [name] ([id number]) -------------------------------------------------------------------------------- Multi Wound Chart Details Patient Name: [name] [name], [name] Date of Service: 01/05/2015 12:45 PM Medical Record Number: [id number] Patient Account Number: [id number] Date of Birth/Sex: Aug 18, 1926 (81 y.o. Female) Treating RN: [name] Gouty, RN, [name], [name] [name] Primary Care Physician: [name] [name] Other Clinician: Referring Physician: Golden [name] Treating Physician/Extender: [name] [name] in Treatment: 0 Vital Signs Height(in): 58 Pulse(bpm): 66 Weight(lbs): 155 Blood Pressure 144/55 (mmHg): Body Mass Index(BMI): 32 Temperature(F): 98.0 Respiratory Rate 17 (breaths/min): Photos: [1:No Photos] [N/A:N/A] Wound Location: [1:Right Toe Great] [N/A:N/A] Wounding Event:  [1:Gradually Appeared] [N/A:N/A] Primary Etiology: [1:To be determined] [N/A:N/A] Comorbid History: [1:Cataracts, Hypertension, Gout, Osteoarthritis] [N/A:N/A] Date Acquired: [1:11/20/2014] [N/A:N/A] Weeks of Treatment: [1:0] [N/A:N/A] Wound Status: [1:Open] [N/A:N/A] Measurements L x W x D 2x3.2x0.2 [N/A:N/A] (cm) Area (cm) : [1:5.027] [N/A:N/A] Volume (cm) : [1:1.005] [N/A:N/A] % Reduction in Area: [1:0.00%] [N/A:N/A] % Reduction in Volume: 0.00% [N/A:N/A] Classification: [1:Full Thickness Without Exposed Support Structures] [N/A:N/A] Exudate Amount: [1:Medium] [N/A:N/A] Exudate Type: [1:Serosanguineous] [N/A:N/A] Exudate Color: [1:red, brown] [N/A:N/A] Wound Margin: [1:Distinct, outline attached] [N/A:N/A] Granulation Amount: [1:Medium (34-66%)] [N/A:N/A] Granulation Quality: [1:Pink] [N/A:N/A] Necrotic Amount: [1:Medium (34-66%)] [N/A:N/A] Exposed Structures: [1:Fascia: No Fat: No Tendon: No Muscle: No Joint: No] [N/A:N/A] Bone: No Limited to Skin Breakdown Periwound Skin Texture: Edema: No N/A N/A Excoriation: No Induration: No Callus: No Crepitus: No Fluctuance: No Friable: No Rash: No Scarring: No Periwound Skin Moist: Yes N/A N/A Moisture: Maceration: No Dry/Scaly: No Periwound Skin Color: Atrophie Blanche: No N/A  N/A Cyanosis: No Ecchymosis: No Erythema: No Hemosiderin Staining: No Mottled: No Pallor: No Rubor: No Temperature: No Abnormality N/A N/A Tenderness on No N/A N/A Palpation: Wound Preparation: Ulcer Cleansing: N/A N/A Rinsed/Irrigated with Saline Topical Anesthetic Applied: Other: lidocaine % Treatment Notes Electronic Signature(s) Signed: 01/05/2015 1:24:15 PM By: [name] [name] BSN, RN Entered By: [name] [name] on 01/05/2015 13:24:14 [name] [name], [name] ([id number]) -------------------------------------------------------------------------------- [place] Details Patient Name: [name] [name], [name] Date of Service:  01/05/2015 12:45 PM Medical Record Number: [id number] Patient Account Number: [id number] Date of Birth/Sex: 11-06-1926 (81 y.o. Female) Treating RN: [name] [name], [name] [name] Primary Care Physician: [name] [name] Other Clinician: Referring Physician: Golden [name] Treating Physician/Extender: [name] [name] in Treatment: 0 Active Inactive Orientation to the Wound Care Program Nursing Diagnoses: Knowledge deficit related to the wound healing center program Goals: Patient/caregiver will verbalize understanding of the [facility] Program Date Initiated: 01/05/2015 Goal Status: Active Interventions: Provide education on orientation to the wound center Notes: Wound/Skin Impairment Nursing Diagnoses: Impaired tissue integrity Knowledge deficit related to ulceration/compromised skin integrity Goals: Patient/caregiver will verbalize understanding of skin care regimen Date Initiated: 01/05/2015 Goal Status: Active Ulcer/skin breakdown will have a volume reduction of 30% by week 4 Date Initiated: 01/05/2015 Goal Status: Active Ulcer/skin breakdown will have a volume reduction of 50% by week 8 Date Initiated: 01/05/2015 Goal Status: Active Ulcer/skin breakdown will have a volume reduction of 80% by week 12 Date Initiated: 01/05/2015 Goal Status: Active Ulcer/skin breakdown will heal within 14 weeks Date Initiated: 01/05/2015 [name] [name], [name] ([id number]) Goal Status: Active Interventions: Assess patient/caregiver ability to perform ulcer/skin care regimen upon admission and as needed Assess ulceration(s) every visit Provide education on ulcer and skin care Treatment Activities: Refer to smoking cessation program : 01/05/2015 Skin care regimen initiated : 01/05/2015 Topical wound management initiated : 01/05/2015 Notes: Electronic Signature(s) Signed: 01/05/2015 1:24:01 PM By: [name] [name] BSN, RN Entered By: [name] [name] on 01/05/2015 13:24:00 [name] [name], Clayton L.  ([id number]) -------------------------------------------------------------------------------- Pain Assessment Details Patient Name: Lavallee, Marzetta L. Date of Service: 01/05/2015 12:45 PM Medical Record Number: [id number] Patient Account Number: [id number] Date of Birth/Sex: 27-Jun-1926 (81 y.o. Female) Treating RN: [name] Gouty, RN, [name], [name] [name] Primary Care Physician: [name] [name] Other Clinician: Referring Physician: Golden [name] Treating Physician/Extender: [name] [name] in Treatment: 0 Active Problems Location of Pain Severity and Description of Pain Patient Has Paino No Site Locations Pain Management and Medication Current Pain Management: Electronic Signature(s) Signed: 01/05/2015 12:55:56 PM By: [name] [name] BSN, RN Entered By: [name] [name] on 01/05/2015 12:55:56 Bunyard, Monda L. ([id number]) -------------------------------------------------------------------------------- Patient/Caregiver Education Details Patient Name: Mccarney, Zaraya L. Date of Service: 01/05/2015 12:45 PM Medical Record Number: [id number] Patient Account Number: [id number] Date of Birth/Gender: 08-28-1926 (81 y.o. Female) Treating RN: [name] Gouty, RN, [name], [name] [name] Primary Care Physician: [name] [name] Other Clinician: Referring Physician: Golden [name] Treating Physician/Extender: [name] [name] in Treatment: 0 Education Assessment Education Provided To: Patient Education Topics Provided Basic Hygiene: Methods: Explain/Verbal Responses: State content correctly Welcome To [facility] [place]: Methods: Explain/Verbal Responses: State content correctly Wound/Skin Impairment: Methods: Explain/Verbal Responses: State content correctly Electronic Signature(s) Signed: 01/05/2015 1:55:34 PM By: [name] [name] BSN, RN Entered By: [name] [name] on 01/05/2015 13:55:34 Dimmick, Sheriece L. ([id number]) -------------------------------------------------------------------------------- Wound Assessment  Details Patient Name: Domek, Shardai L. Date of Service: 01/05/2015 12:45 PM Medical Record Number: [id number] Patient Account Number: [id number] Date of Birth/Sex: 20-Jul-1926 (81 y.o. Female) Treating RN: [name] Gouty, RN, [name], [name] [name] Primary Care Physician: [name] [name] Other Clinician: Referring Physician: Jeananne Rama  MARK Treating Physician/Extender: [name] [name] in Treatment: 0 Wound Status Wound Number: 1 Primary To be determined Etiology: Wound Location: Right Toe Great Wound Status: Open Wounding Event: Gradually Appeared Comorbid Cataracts, Hypertension, Gout, Date Acquired: 11/20/2014 History: Osteoarthritis Weeks Of Treatment: 0 Clustered Wound: No Photos Photo Uploaded By: [name] [name] on 01/05/2015 16:35:46 Wound Measurements Length: (cm) 2 Width: (cm) 3.2 Depth: (cm) 0.2 Area: (cm) 5.027 Volume: (cm) 1.005 % Reduction in Area: 0% % Reduction in Volume: 0% Tunneling: No Undermining: No Wound Description Full Thickness Without Exposed Foul Odor Af Classification: Support Structures Wound Margin: Distinct, outline attached Exudate Medium Amount: Exudate Type: Serosanguineous Exudate Color: red, brown ter Cleansing: No Wound Bed Granulation Amount: Medium (34-66%) Exposed Structure Granulation Quality: Pink Fascia Exposed: No Necrotic Amount: Medium (34-66%) Fat Layer Exposed: No Stankowski, Satara L. ([id number]) Necrotic Quality: Adherent Slough Tendon Exposed: No Muscle Exposed: No Joint Exposed: No Bone Exposed: No Limited to Skin Breakdown Periwound Skin Texture Texture Color No Abnormalities Noted: No No Abnormalities Noted: No Callus: No Atrophie Blanche: No Crepitus: No Cyanosis: No Excoriation: No Ecchymosis: No Fluctuance: No Erythema: No Friable: No Hemosiderin Staining: No Induration: No Mottled: No Localized Edema: No Pallor: No Rash: No Rubor: No Scarring: No Temperature / Pain Moisture Temperature: No Abnormality No  Abnormalities Noted: No Dry / Scaly: No Maceration: No Moist: Yes Wound Preparation Ulcer Cleansing: Rinsed/Irrigated with Saline Topical Anesthetic Applied: Other: lidocaine %, Treatment Notes Wound #1 (Right Toe Great) 1. Cleansed with: Clean wound with Normal Saline 4. Dressing Applied: Aquacel Ag 5. Secondary Dressing Applied Gauze and Kerlix/Conform 7. Secured with Recruitment consultant) Signed: 01/05/2015 1:10:19 PM By: [name] [name] BSN, RN Entered By: [name] [name] on 01/05/2015 13:10:19 Aller, Kris L. ([id number]) -------------------------------------------------------------------------------- Vitals Details Patient Name: Berisha, Kashae L. Date of Service: 01/05/2015 12:45 PM Medical Record Number: [id number] Patient Account Number: [id number] Date of Birth/Sex: 03/29/1926 (81 y.o. Female) Treating RN: [name] [name], [place] Primary Care Physician: [name] [name] Other Clinician: Referring Physician: Golden [name] Treating Physician/Extender: [name] [name] in Treatment: 0 Vital Signs Time Taken: 13:00 Temperature (F): 98.0 Height (in): 58 Pulse (bpm): 66 Source: Stated Respiratory Rate (breaths/min): 17 Weight (lbs): 155 Blood Pressure (mmHg): 144/55 Source: Measured Reference Range: 80 - 120 mg / dl Body Mass Index (BMI): 32.4 Electronic Signature(s) Signed: 01/05/2015 1:00:45 PM By: [name] [name] BSN, RN Entered By: [name] [name] on 01/05/2015 13:00:45

## 2015-01-06 NOTE — Progress Notes (Signed)
Grzywacz, Briggitte L. ([id number]) Visit Report for 01/05/2015 Abuse/Suicide Risk Screen Details Patient Name: [name] [name], [name] [name] 01/05/2015 12:45 Date of Service: PM Medical Record [id number] Number: Patient Account Number: [id number] [birth year] (81 y.o. Treating RN: Baruch Gouty, RN, BSN, Velva Harman Date of Birth/Sex: Female) Other Clinician: Primary Care Physician: [name] Treating Britto, Errol Referring Physician: Golden Pop Physician/Extender: Suella Grove in Treatment: 0 Abuse/Suicide Risk Screen Items Answer ABUSE/SUICIDE RISK SCREEN: Has anyone close to you tried to hurt or harm you recentlyo No Do you feel uncomfortable with anyone in your familyo No Has anyone forced you do things that you didnot want to doo No Do you have any thoughts of harming yourselfo No Patient displays signs or symptoms of abuse and/or neglect. No Electronic Signature(s) Signed: 01/05/2015 12:57:49 PM By: Regan Lemming BSN, RN Entered By: Regan Lemming on 01/05/2015 12:57:49 Lakey, Ottilie L. ([id number]) -------------------------------------------------------------------------------- Activities of Daily Living Details Patient Name: [name] [name], [name] [name] 01/05/2015 12:45 Date of Service: PM Medical Record [id number] Number: Patient Account Number: [id number] [birth year] (81 y.o. Treating RN: Baruch Gouty, RN, BSN, Velva Harman Date of Birth/Sex: Female) Other Clinician: Primary Care Physician: [name] Treating Britto, Errol Referring Physician: Golden Pop Physician/Extender: Suella Grove in Treatment: 0 Activities of Daily Living Items Answer Activities of Daily Living (Please select one for each item) Drive Automobile Need Assistance Take Medications Need Assistance Use Telephone Need Assistance Care for Appearance Need Assistance Use Toilet Need Assistance Bath / Shower Need Assistance Dress Self Need Assistance Feed Self Need Assistance Walk Need Assistance Get In / Out Bed Need Assistance Housework Need  Assistance Prepare Meals Need Assistance Handle Money Need Assistance Shop for Self Need Assistance Electronic Signature(s) Signed: 01/05/2015 12:59:05 PM By: Regan Lemming BSN, RN Entered By: Regan Lemming on 01/05/2015 12:59:05 Erway, Victory L. ([id number]) -------------------------------------------------------------------------------- Education Assessment Details Patient Name: [name] [name], [name] [name] 01/05/2015 12:45 Date of Service: PM Medical Record [id number] Number: Patient Account Number: [id number] 1926/05/26 (81 y.o. Treating RN: Baruch Gouty, RN, BSN, Velva Harman Date of Birth/Sex: Female) Other Clinician: Primary Care Physician: [name] Treating Britto, Errol Referring Physician: Golden Pop Physician/Extender: Suella Grove in Treatment: 0 Primary Learner Assessed: Patient Learning Preferences/Education Level/Primary Language Learning Preference: Explanation Preferred Language: English Cognitive Barrier Assessment/Beliefs Language Barrier: No Physical Barrier Assessment Impaired Vision: Yes Glasses Impaired Hearing: No Decreased Hand dexterity: No Knowledge/Comprehension Assessment Knowledge Level: Medium Comprehension Level: Medium Ability to understand written Medium instructions: Ability to understand verbal Medium instructions: Motivation Assessment Anxiety Level: Calm Cooperation: Cooperative Interest in Health Problems: Asks Questions Willingness to Engage in Self- Medium Management Activities: Readiness to Engage in Self- Medium Management Activities: Electronic Signature(s) Signed: 01/05/2015 12:59:26 PM By: Regan Lemming BSN, RN Entered By: Regan Lemming on 01/05/2015 12:59:26 Varady, Mahika L. ([id number]) -------------------------------------------------------------------------------- Fall Risk Assessment Details Patient Name: [name] [name], [name] [name] 01/05/2015 12:45 Date of Service: PM Medical Record [id number] Number: Patient Account Number:  [id number] Sep 10, 1926 (81 y.o. Treating RN: Baruch Gouty, RN, BSN, Velva Harman Date of Birth/Sex: Female) Other Clinician: Primary Care Physician: [name] Treating Britto, Errol Referring Physician: Golden Pop Physician/Extender: Suella Grove in Treatment: 0 Fall Risk Assessment Items FALL RISK ASSESSMENT: History of falling - immediate or within 3 months 0 No Secondary diagnosis 0 No Ambulatory aid None/bed rest/wheelchair/nurse 0 No Crutches/cane/walker 15 Yes Furniture 0 No IV Access/Saline Lock 0 No Gait/Training Normal/bed rest/immobile 0 No Weak 10 Yes Impaired 0 No Mental Status Oriented to own ability 0 Yes Electronic Signature(s) Signed: 01/05/2015 12:58:01 PM By: Regan Lemming BSN, RN Entered By: Regan Lemming on 01/05/2015 12:58:01 Howald, Elease L. (  [id number]) -------------------------------------------------------------------------------- Foot Assessment Details Patient Name: [name] [name], [name] [name] 01/05/2015 12:45 Date of Service: PM Medical Record [id number] Number: Patient Account Number: [id number] April 08, 1926 (81 y.o. Treating RN: Baruch Gouty, RN, BSN, Velva Harman Date of Birth/Sex: Female) Other Clinician: Primary Care Physician: [name] Treating Britto, Errol Referring Physician: Golden Pop Physician/Extender: Suella Grove in Treatment: 0 Foot Assessment Items Site Locations + = Sensation present, - = Sensation absent, C = Callus, U = Ulcer R = Redness, W = Warmth, M = Maceration, PU = Pre-ulcerative lesion F = Fissure, S = Swelling, D = Dryness Assessment Right: Left: Other Deformity: No No Prior Foot Ulcer: No No Prior Amputation: No No Charcot Joint: No No Ambulatory Status: Ambulatory With Help Assistance Device: Walker Gait: Administrator, arts) Signed: 01/05/2015 12:56:18 PM By: Regan Lemming BSN, RN Entered By: Regan Lemming on 01/05/2015 12:56:18 Kolk, [place] ([id number]) Vaness, Keierra L.  ([id number]) -------------------------------------------------------------------------------- Nutrition Risk Assessment Details Patient Name: [name] [name], [name] [name] 01/05/2015 12:45 Date of Service: PM Medical Record [id number] Number: Patient Account Number: [id number] 09/26/1926 (81 y.o. Treating RN: Baruch Gouty, RN, BSN, Velva Harman Date of Birth/Sex: Female) Other Clinician: Primary Care Physician: [name] Treating Britto, Errol Referring Physician: Golden Pop Physician/Extender: Suella Grove in Treatment: 0 Height (in): Weight (lbs): Body Mass Index (BMI): Nutrition Risk Assessment Items NUTRITION RISK SCREEN: I have an illness or condition that made me change the kind and/or 0 No amount of food I eat I eat fewer than two meals per day 0 No I eat few fruits and vegetables, or milk products 0 No I have three or more drinks of beer, liquor or wine almost every day 0 No I have tooth or mouth problems that make it hard for me to eat 0 No I don't always have enough money to buy the food I need 0 No I eat alone most of the time 1 Yes I take three or more different prescribed or over-the-counter drugs a 0 No day Without wanting to, I have lost or gained 10 pounds in the last six 2 Yes months I am not always physically able to shop, cook and/or feed myself 0 No Nutrition Protocols Good Risk Protocol Provide education on Moderate Risk Protocol 0 nutrition Electronic Signature(s) Signed: 01/05/2015 12:58:18 PM By: Regan Lemming BSN, RN Entered By: Regan Lemming on 01/05/2015 12:58:18

## 2015-01-15 ENCOUNTER — Ambulatory Visit: Payer: Medicare PPO | Admitting: Family Medicine

## 2015-01-15 ENCOUNTER — Encounter: Payer: Medicare PPO | Admitting: Surgery

## 2015-01-15 DIAGNOSIS — L97512 Non-pressure chronic ulcer of other part of right foot with fat layer exposed: Secondary | ICD-10-CM | POA: Diagnosis not present

## 2015-01-16 NOTE — Progress Notes (Signed)
Hase, Raquel L. ([id number]) Visit Report for 01/15/2015 Arrival Information Details Patient Name: Lattner, MAUDENE ARSENAULT. Date of Service: 01/15/2015 2:45 PM Medical Record Number: [id number] Patient Account Number: [id number] Date of Birth/Sex: [birth year] (81 y.o. Female) Treating RN: Ahmed Prima Primary Care Physician: [name] Other Clinician: Referring Physician: [name] Treating Physician/Extender: Frann Rider in Treatment: 1 Visit Information History Since Last Visit All ordered tests and consults were completed: No Patient Arrived: [name] Added or deleted any medications: No Arrival Time: 14:36 Any new allergies or adverse reactions: No Accompanied By: daughter Had a fall or experienced change in No Transfer Assistance: None activities of daily living that may affect Patient Identification Verified: Yes risk of falls: Secondary Verification Process Yes Signs or symptoms of abuse/neglect since last No Completed: visito Patient Requires Transmission-Based No Hospitalized since last visit: No Precautions: Pain Present Now: No Patient Has Alerts: No Electronic Signature(s) Signed: 01/15/2015 5:02:22 PM By: Alric Quan Entered By: Alric Quan on 01/15/2015 14:37:35 Bumbaugh, Michaline L. ([id number]) -------------------------------------------------------------------------------- Encounter Discharge Information Details Patient Name: Zenz, Luree L. Date of Service: 01/15/2015 2:45 PM Medical Record Number: [id number] Patient Account Number: [id number] Date of Birth/Sex: [birth year] (81 y.o. Female) Treating RN: Ahmed Prima Primary Care Physician: [name] Other Clinician: Referring Physician: [name] Treating Physician/Extender: Frann Rider in Treatment: 1 Encounter Discharge Information Items Discharge Pain Level: 0 Discharge Condition: Stable Ambulatory Status: Walker Discharge Destination:  Home Transportation: Private Auto Accompanied By: daughter Schedule Follow-up Appointment: Yes Medication Reconciliation completed and provided to Patient/Care Yes Deavin Forst: Provided on Clinical Summary of Care: 01/15/2015 Form Type Recipient Paper Patient IM Electronic Signature(s) Signed: 01/15/2015 3:27:13 PM By: Ruthine Dose Entered By: Ruthine Dose on 01/15/2015 15:27:13 Rathel, Valeri L. ([id number]) -------------------------------------------------------------------------------- Lower Extremity Assessment Details Patient Name: Bixby, Lenaya L. Date of Service: 01/15/2015 2:45 PM Medical Record Number: [id number] Patient Account Number: [id number] Date of Birth/Sex: 03/23/1926 (81 y.o. Female) Treating RN: Ahmed Prima Primary Care Physician: [name] Other Clinician: Referring Physician: [name] Treating Physician/Extender: Frann Rider in Treatment: 1 Vascular Assessment Pulses: Posterior Tibial Dorsalis Pedis Palpable: [Left:Yes] [Right:Yes] Extremity colors, hair growth, and conditions: Extremity Color: [Left:Normal] [Right:Normal] Hair Growth on Extremity: [Left:Yes] [Right:Yes] Temperature of Extremity: [Left:Warm] Capillary Refill: [Left:< 3 seconds] [Right:< 3 seconds] Toe Nail Assessment Left: Right: Thick: Yes Discolored: Yes Deformed: No Improper Length and Hygiene: Yes Electronic Signature(s) Signed: 01/15/2015 5:02:22 PM By: Alric Quan Entered By: Alric Quan on 01/15/2015 14:52:46 Deriso, Makensie L. ([id number]) -------------------------------------------------------------------------------- Multi Wound Chart Details Patient Name: Eckley, Laquashia L. Date of Service: 01/15/2015 2:45 PM Medical Record Number: [id number] Patient Account Number: [id number] Date of Birth/Sex: March 04, 1926 (81 y.o. Female) Treating RN: Ahmed Prima Primary Care Physician: [name] Other Clinician: Referring Physician:  [name] Treating Physician/Extender: Frann Rider in Treatment: 1 Vital Signs Height(in): 58 Pulse(bpm): 67 Weight(lbs): 155 Blood Pressure 152/54 (mmHg): Body Mass Index(BMI): 32 Temperature(F): 97.7 Respiratory Rate 18 (breaths/min): Photos: [1:No Photos] [N/A:N/A] Wound Location: [1:Right Toe Great] [N/A:N/A] Wounding Event: [1:Gradually Appeared] [N/A:N/A] Primary Etiology: [1:To be determined] [N/A:N/A] Comorbid History: [1:Cataracts, Hypertension, Gout, Osteoarthritis] [N/A:N/A] Date Acquired: [1:11/20/2014] [N/A:N/A] Weeks of Treatment: [1:1] [N/A:N/A] Wound Status: [1:Open] [N/A:N/A] Measurements L x W x D 0.8x1.9x0.2 [N/A:N/A] (cm) Area (cm) : [1:1.194] [N/A:N/A] Volume (cm) : [1:0.239] [N/A:N/A] % Reduction in Area: [1:76.20%] [N/A:N/A] % Reduction in Volume: 76.20% [N/A:N/A] Position 1 (o'clock): 4 Maximum Distance 1 1.5 (cm): Tunneling: [1:Yes] [N/A:N/A] Classification: [1:Full Thickness Without Exposed Support Structures] [N/A:N/A] Exudate Amount: [1:Medium] [N/A:N/A] Exudate Type: [  1:Serosanguineous] [N/A:N/A] Exudate Color: [1:red, brown] [N/A:N/A] Wound Margin: [1:Distinct, outline attached] [N/A:N/A] Granulation Amount: [1:Large (67-100%)] [N/A:N/A] Granulation Quality: [1:Pink] [N/A:N/A] Necrotic Amount: [1:None Present (0%)] [N/A:N/A] Exposed Structures: Fascia: No N/A N/A Fat: No Tendon: No Muscle: No Joint: No Bone: No Limited to Skin Breakdown Epithelialization: Small (1-33%) N/A N/A Periwound Skin Texture: Callus: Yes N/A N/A Edema: No Excoriation: No Induration: No Crepitus: No Fluctuance: No Friable: No Rash: No Scarring: No Periwound Skin Moist: Yes N/A N/A Moisture: Dry/Scaly: Yes Maceration: No Periwound Skin Color: Atrophie Blanche: No N/A N/A Cyanosis: No Ecchymosis: No Erythema: No Hemosiderin Staining: No Mottled: No Pallor: No Rubor: No Temperature: No Abnormality N/A N/A Tenderness on No N/A  N/A Palpation: Wound Preparation: Ulcer Cleansing: N/A N/A Rinsed/Irrigated with Saline Topical Anesthetic Applied: Other: lidocaine 4% Treatment Notes Electronic Signature(s) Signed: 01/15/2015 5:02:22 PM By: Alric Quan Entered By: Alric Quan on 01/15/2015 14:59:46 Malphrus, R[place]rum ([id number]) -------------------------------------------------------------------------------- [place] Details Patient Name: Fauble, Tinita L. Date of Service: 01/15/2015 2:45 PM Medical Record Number: [id number] Patient Account Number: [id number] Date of Birth/Sex: 01-06-1927 (81 y.o. Female) Treating RN: Ahmed Prima Primary Care Physician: [name] Other Clinician: Referring Physician: [name] Treating Physician/Extender: Frann Rider in Treatment: 1 Active Inactive Orientation to the Wound Care Program Nursing Diagnoses: Knowledge deficit related to the wound healing center program Goals: Patient/caregiver will verbalize understanding of the [place] Program Date Initiated: 01/05/2015 Goal Status: Active Interventions: Provide education on orientation to the wound center Notes: Wound/Skin Impairment Nursing Diagnoses: Impaired tissue integrity Knowledge deficit related to ulceration/compromised skin integrity Goals: Patient/caregiver will verbalize understanding of skin care regimen Date Initiated: 01/05/2015 Goal Status: Active Ulcer/skin breakdown will have a volume reduction of 30% by week 4 Date Initiated: 01/05/2015 Goal Status: Active Ulcer/skin breakdown will have a volume reduction of 50% by week 8 Date Initiated: 01/05/2015 Goal Status: Active Ulcer/skin breakdown will have a volume reduction of 80% by week 12 Date Initiated: 01/05/2015 Goal Status: Active Ulcer/skin breakdown will heal within 14 weeks Date Initiated: 01/05/2015 Killough, Cynda L. ([id number]) Goal Status: Active Interventions: Assess  patient/caregiver ability to perform ulcer/skin care regimen upon admission and as needed Assess ulceration(s) every visit Provide education on ulcer and skin care Treatment Activities: Refer to smoking cessation program : 01/15/2015 Skin care regimen initiated : 01/15/2015 Topical wound management initiated : 01/15/2015 Notes: Electronic Signature(s) Signed: 01/15/2015 5:02:22 PM By: Alric Quan Entered By: Alric Quan on 01/15/2015 14:59:40 Luckenbach, Milicent L. ([id number]) -------------------------------------------------------------------------------- Pain Assessment Details Patient Name: Watton, Pihu L. Date of Service: 01/15/2015 2:45 PM Medical Record Number: [id number] Patient Account Number: [id number] Date of Birth/Sex: 05-02-1926 (81 y.o. Female) Treating RN: Ahmed Prima Primary Care Physician: [name] Other Clinician: Referring Physician: [name] Treating Physician/Extender: Frann Rider in Treatment: 1 Active Problems Location of Pain Severity and Description of Pain Patient Has Paino No Site Locations Pain Management and Medication Current Pain Management: Electronic Signature(s) Signed: 01/15/2015 5:02:22 PM By: Alric Quan Entered By: Alric Quan on 01/15/2015 14:37:41 Bullen, Cameshia L. ([id number]) -------------------------------------------------------------------------------- Patient/Caregiver Education Details Patient Name: Brooker, [name] Date of Service: 01/15/2015 2:45 PM Medical Record Number: [id number] Patient Account Number: [id number] Date of Birth/Gender: 1926-08-29 (81 y.o. Female) Treating RN: Ahmed Prima Primary Care Physician: [name] Other Clinician: Referring Physician: [name] Treating Physician/Extender: Frann Rider in Treatment: 1 Education Assessment Education Provided To: Patient and Caregiver Education Topics Provided Offloading: Handouts: Other: new DH  walking boot Methods: Explain/Verbal Responses: State content  correctly Wound/Skin Impairment: Handouts: Other: change dressing as directed Methods: Demonstration, Explain/Verbal Responses: State content correctly Electronic Signature(s) Signed: 01/15/2015 5:02:22 PM By: Alric Quan Entered By: Alric Quan on 01/15/2015 15:16:45 Maurer, [place] ([id number]) -------------------------------------------------------------------------------- Wound Assessment Details Patient Name: Showers, Valaree L. Date of Service: 01/15/2015 2:45 PM Medical Record Number: [id number] Patient Account Number: [id number] Date of Birth/Sex: 1926-03-02 (81 y.o. Female) Treating RN: Carolyne Fiscal, Debi Primary Care Physician: [name] Other Clinician: Referring Physician: [name] Treating Physician/Extender: Frann Rider in Treatment: 1 Wound Status Wound Number: 1 Primary To be determined Etiology: Wound Location: Right Toe Great Wound Status: Open Wounding Event: Gradually Appeared Comorbid Cataracts, Hypertension, Gout, Date Acquired: 11/20/2014 History: Osteoarthritis Weeks Of Treatment: 1 Clustered Wound: No Photos Photo Uploaded By: Gretta Cool, RN, BSN, Kim on 01/15/2015 17:09:58 Wound Measurements Length: (cm) 0.8 Width: (cm) 1.9 Depth: (cm) 0.2 Area: (cm) 1.194 Volume: (cm) 0.239 % Reduction in Area: 76.2% % Reduction in Volume: 76.2% Epithelialization: Small (1-33%) Tunneling: Yes Position (o'clock): 4 Maximum Distance: (cm) 1.5 Wound Description Full Thickness Without Exposed Classification: Support Structures Wound Margin: Distinct, outline attached Exudate Medium Amount: Exudate Type: Serosanguineous Exudate Color: red, brown Foul Odor After Cleansing: No Wound Bed Granulation Amount: Large (67-100%) Exposed Structure Isaac, Saryn L. ([id number]) Granulation Quality: Pink Fascia Exposed: No Necrotic Amount: None Present (0%) Fat Layer  Exposed: No Tendon Exposed: No Muscle Exposed: No Joint Exposed: No Bone Exposed: No Limited to Skin Breakdown Periwound Skin Texture Texture Color No Abnormalities Noted: No No Abnormalities Noted: No Callus: Yes Atrophie Blanche: No Crepitus: No Cyanosis: No Excoriation: No Ecchymosis: No Fluctuance: No Erythema: No Friable: No Hemosiderin Staining: No Induration: No Mottled: No Localized Edema: No Pallor: No Rash: No Rubor: No Scarring: No Temperature / Pain Moisture Temperature: No Abnormality No Abnormalities Noted: No Dry / Scaly: Yes Maceration: No Moist: Yes Wound Preparation Ulcer Cleansing: Rinsed/Irrigated with Saline Topical Anesthetic Applied: Other: lidocaine 4%, Treatment Notes Wound #1 (Right Toe Great) 1. Cleansed with: Clean wound with Normal Saline 2. Anesthetic Topical Lidocaine 4% cream to wound bed prior to debridement 4. Dressing Applied: Aquacel Ag 5. Secondary Dressing Applied Gauze and Kerlix/Conform 6. Footwear/Offloading device applied Other footwear/offloading device applied (specify in notes) 7. Secured with Tape Notes DH walking boot Hurley, Rebecka L. ([id number]) Electronic Signature(s) Signed: 01/15/2015 5:02:22 PM By: Alric Quan Entered By: Alric Quan on 01/15/2015 14:55:59 Campas, Terianne L. ([id number]) -------------------------------------------------------------------------------- Vitals Details Patient Name: Turlington, Evalina L. Date of Service: 01/15/2015 2:45 PM Medical Record Number: [id number] Patient Account Number: [id number] Date of Birth/Sex: [birth year] (81 y.o. Female) Treating RN: Carolyne Fiscal, Debi Primary Care Physician: [name] Other Clinician: Referring Physician: [name] Treating Physician/Extender: Frann Rider in Treatment: 1 Vital Signs Time Taken: 14:50 Temperature (F): 97.7 Height (in): 58 Pulse (bpm): 67 Weight (lbs): 155 Respiratory Rate (breaths/min):  18 Body Mass Index (BMI): 32.4 Blood Pressure (mmHg): 152/54 Reference Range: 80 - 120 mg / dl Electronic Signature(s) Signed: 01/15/2015 5:02:22 PM By: Alric Quan Entered By: Alric Quan on 01/15/2015 14:50:29

## 2015-01-16 NOTE — Progress Notes (Signed)
Culp, Marieliz L. ([id number]) Visit Report for 01/15/2015 Chief Complaint Document Details Patient Name: [name] [name], [name] [name] 01/15/2015 2:45 Date of Service: PM Medical Record [id number] Number: Patient Account Number: [id number] 04-10-1926 (81 y.o. Treating RN: Cornell Barman Date of Birth/Sex: Female) Other Clinician: Primary Care Physician: [name] Treating Christin Fudge Referring Physician: [name] Physician/Extender: Suella Grove in Treatment: 1 Information Obtained from: Patient Chief Complaint Patient presents to the wound care center for a consult due non healing wound. She has had a nonhealing wound on the right great toe since July of this year Electronic Signature(s) Signed: 01/15/2015 3:16:26 PM By: Christin Fudge MD, FACS Entered By: Christin Fudge on 01/15/2015 15:16:26 Schouten, [name] ([id number]) -------------------------------------------------------------------------------- Debridement Details Patient Name: [name] [name], [name] [name] 01/15/2015 2:45 Date of Service: PM Medical Record [id number] Number: Patient Account Number: [id number] 1926-05-21 (81 y.o. Treating RN: Cornell Barman Date of Birth/Sex: Female) Other Clinician: Primary Care Physician: [name] Treating Zury Fazzino Referring Physician: [name] Physician/Extender: Suella Grove in Treatment: 1 Debridement Performed for Wound #1 Right Toe Great Assessment: Performed By: Physician Christin Fudge, MD Debridement: Debridement Pre-procedure Yes Verification/Time Out Taken: Start Time: 15:06 Pain Control: Other : lidocaine 4% Level: Skin/Subcutaneous Tissue Total Area Debrided (L x 0.8 (cm) x 1.9 (cm) = 1.52 (cm) W): Tissue and other Viable, Non-Viable, Callus, Fibrin/Slough, Skin, Subcutaneous material debrided: Instrument: Forceps, Scissors Bleeding: Minimum Hemostasis Achieved: Silver Nitrate End Time: 15:09 Procedural Pain: 0 Post Procedural Pain: 1 Response to Treatment:  Procedure was tolerated well Post Debridement Measurements of Total Wound Length: (cm) 0.8 Width: (cm) 1.7 Depth: (cm) 0.3 Volume: (cm) 0.32 Post Procedure Diagnosis Same as Pre-procedure Notes the 2 ulcerations now have a fairly hard callus bridging the 2 and below the callus is a deeper ulcer and hence I will sharply excise this and make this one large ulcer. Electronic Signature(s) Signed: 01/15/2015 3:16:20 PM By: Christin Fudge MD, FACS Signed: 01/15/2015 5:17:02 PM By: Gretta Cool RN, BSN, Kim RN, BSN Battaglini, Siara L. ([id number]) Entered By: Christin Fudge on 01/15/2015 15:16:20 Katt, Nikka L. ([id number]) -------------------------------------------------------------------------------- HPI Details Patient Name: [name] [name], [name] [name] 01/15/2015 2:45 Date of Service: PM Medical Record [id number] Number: Patient Account Number: [id number] 01-26-1927 (81 y.o. Treating RN: Cornell Barman Date of Birth/Sex: Female) Other Clinician: Primary Care Physician: [name] Treating Christin Fudge Referring Physician: [name] Physician/Extender: Suella Grove in Treatment: 1 History of Present Illness Location: ulcer on the right big toe Quality: Patient reports experiencing a dull pain to affected area(s). Severity: Patient states wound are getting worse. Duration: Patient has had the wound for > 3 months prior to seeking treatment at the wound center Timing: Pain in wound is Intermittent (comes and goes Context: The wound appeared gradually over time Modifying Factors: Other treatment(s) tried include: has been treated with doxycycline and prior to that Keflex Associated Signs and Symptoms: Patient reports having difficulty standing for long periods. HPI Description: 81 year old patient was recently seen by the PCPs office for significant pain right great toe which has been going on since July. She was initially treated with Keflex which she did not complete and after the office visit  this time she has been put on doxycycline. at the time of her visit she was found to have a ulcer on the plantar surface of the right great toe and also had a pyogenic granuloma over this area. X-ray of the right foot done 12/29/2014 -- IMPRESSION:Soft-tissue swelling and ulceration right great toe. No underlying bony lytic lesion identified. If osteomyelitis remains of clinical  concern MRI can be obtained. Past medical history significant for anemia, chronic kidney disease stage III, obesity, varicose veins, coronary artery disease, gout, history of nicotine addiction given up smoking in 2002, hypertension, status post cardiac catheterization, status post abdominal hysterectomy, cholecystectomy and tonsillectomy. hemoglobin A1c done in August was 5.8 Electronic Signature(s) Signed: 01/15/2015 3:16:33 PM By: Christin Fudge MD, FACS Entered By: Christin Fudge on 01/15/2015 15:16:33 Keizer, Lametria L. ([id number]) -------------------------------------------------------------------------------- Physical Exam Details Patient Name: [name] [name], [name] [name] 01/15/2015 2:45 Date of Service: PM Medical Record [id number] Number: Patient Account Number: [id number] Jun 21, 1926 (81 y.o. Treating RN: Cornell Barman Date of Birth/Sex: Female) Other Clinician: Primary Care Physician: [name] Treating Christin Fudge Referring Physician: [name] Physician/Extender: Suella Grove in Treatment: 1 Constitutional . Pulse regular. Respirations normal and unlabored. Afebrile. . Eyes Nonicteric. Reactive to light. Ears, Nose, Mouth, and Throat Lips, teeth, and gums WNL.Marland Kitchen Moist mucosa without lesions . Neck supple and nontender. No palpable supraclavicular or cervical adenopathy. Normal sized without goiter. Respiratory WNL. No retractions.. Breath sounds WNL, No rubs, rales, rhonchi, or wheeze.. Cardiovascular Heart rhythm and rate regular, no murmur or gallop.. Pedal Pulses WNL. No clubbing, cyanosis or  edema. Chest Breasts symmetical and no nipple discharge.. Breast tissue WNL, no masses, lumps, or tenderness.. Gastrointestinal (GI) Abdomen without masses or tenderness.. No liver or spleen enlargement or tenderness.. Genitourinary (GU) No hydrocele, spermatocele, tenderness of the cord, or testicular mass.Marland Kitchen Penis without lesions.Lowella Fairy without lesions. No cystocele, or rectocele. Pelvic support intact, no discharge. Marland Kitchen Urethra without masses, tenderness or scarring.Marland Kitchen Lymphatic No adneopathy. No adenopathy. No adenopathy. Musculoskeletal Adexa without tenderness or enlargement.. Digits and nails w/o clubbing, cyanosis, infection, petechiae, ischemia, or inflammatory conditions.. Integumentary (Hair, Skin) No suspicious lesions. No crepitus or fluctuance. No peri-wound warmth or erythema. No masses.Marland Kitchen Psychiatric Judgement and insight Intact.. No evidence of depression, anxiety, or agitation.. Notes after sharply debriding the bridge of callus this is now one large ulcer and is to be much easier to dress. Silver nitrate stick was used for hemostasis Mcconico, Wayne L. ([id number]) Electronic Signature(s) Signed: 01/15/2015 3:17:06 PM By: Christin Fudge MD, FACS Entered By: Christin Fudge on 01/15/2015 15:17:05 Faerber, Selah L. ([id number]) -------------------------------------------------------------------------------- Physician Orders Details Patient Name: [name] [name], [name] [name] 01/15/2015 2:45 Date of Service: PM Medical Record [id number] Number: Patient Account Number: [id number] September 24, 1926 (81 y.o. Treating RN: Ahmed Prima Date of Birth/Sex: Female) Other Clinician: Primary Care Physician: [name] Treating Christin Fudge Referring Physician: [name] Physician/Extender: Suella Grove in Treatment: 1 Verbal / Phone Orders: Yes Clinician: Pinkerton, Debi Read Back and Verified: Yes Diagnosis Coding Wound Cleansing Wound #1 Right Toe Great o Clean wound with  Normal Saline. Anesthetic Wound #1 Right Toe Great o Topical Lidocaine 4% cream applied to wound bed prior to debridement Primary Wound Dressing Wound #1 Right Toe Great o Aquacel Ag Secondary Dressing Wound #1 Right Toe Great o Gauze and Kerlix/Conform Dressing Change Frequency Wound #1 Right Toe Great o Change dressing every other day. Follow-up Appointments Wound #1 Right Toe Great o Return Appointment in 1 week. Off-Loading Wound #1 Right Toe Great o Other: - DH walking boot Electronic Signature(s) Signed: 01/15/2015 4:26:49 PM By: Christin Fudge MD, FACS Signed: 01/15/2015 5:02:22 PM By: Alric Quan Purtle, Thu L. ([id number]) Entered By: Alric Quan on 01/15/2015 15:14:37 Boening, Quintina L. ([id number]) -------------------------------------------------------------------------------- Problem List Details Patient Name: [name] [name], [name] [name] 01/15/2015 2:45 Date of Service: PM Medical Record [id number] Number: Patient Account Number: [id number] Dec 12, 1926 (81 y.o. 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 Treating RN: Cornell Barman Date of Birth/Sex: Female) Other Clinician: Primary Care Physician: [name] Treating Christin Fudge Referring Physician: [name] Physician/Extender: Suella Grove in Treatment: 1 Subjective Chief Complaint Information obtained from Patient Patient presents to the wound care center for a consult due non healing wound. She has had a nonhealing wound on the right great toe since July of this year History of Present Illness (HPI) The following HPI elements were documented for the patient's wound: Location: ulcer on the right big toe Quality: Patient reports experiencing a dull pain to affected area(s). Severity: Patient states wound are getting worse. Duration: Patient has had the wound for > 3 months prior to seeking treatment at the wound center Timing: Pain in wound is Intermittent (comes and goes Context: The wound appeared gradually over time Modifying Factors: Other treatment(s) tried include: has been treated with doxycycline and prior to that Keflex Associated Signs and Symptoms: Patient reports having difficulty standing for long periods. 81 year old patient was recently seen by the PCPs office for significant pain right great toe which has been going on since July. She was initially treated with Keflex which she did not complete and after the office visit this time she has been put on doxycycline. at the time of her visit she was found to have a ulcer on the plantar surface of the right great toe and also had a pyogenic granuloma over this area. X-ray of the right foot done 12/29/2014 -- IMPRESSION:Soft-tissue swelling and ulceration right great toe. No underlying bony lytic lesion identified. If osteomyelitis remains of clinical concern MRI can be obtained. Past medical history significant for anemia, chronic kidney disease stage III,  obesity, varicose veins, coronary artery disease, gout, history of nicotine addiction given up smoking in 2002, hypertension, status post cardiac catheterization, status post abdominal hysterectomy, cholecystectomy and tonsillectomy. hemoglobin A1c done in August was 5.8 Objective Knickerbocker, [name] ([id number]) Constitutional Pulse regular. Respirations normal and unlabored. Afebrile. Vitals Time Taken: 2:50 PM, Height: 58 in, Weight: 155 lbs, BMI: 32.4, Temperature: 97.7 F, Pulse: 67 bpm, Respiratory Rate: 18 breaths/min, Blood Pressure: 152/54 mmHg. Eyes Nonicteric. Reactive to light. Ears, Nose, Mouth, and Throat Lips, teeth, and gums WNL.Marland Kitchen Moist mucosa without lesions . Neck supple and nontender. No palpable supraclavicular or cervical adenopathy. Normal sized without goiter. Respiratory WNL. No retractions.. Breath sounds WNL, No rubs, rales, rhonchi, or wheeze.. Cardiovascular Heart rhythm and rate regular, no murmur or gallop.. Pedal Pulses WNL. No clubbing, cyanosis or edema. Chest Breasts symmetical and no nipple discharge.. Breast tissue WNL, no masses, lumps, or tenderness.. Gastrointestinal (GI) Abdomen without masses or tenderness.. No liver or spleen enlargement or tenderness.. Genitourinary (GU) No hydrocele, spermatocele, tenderness of the cord, or testicular mass.Marland Kitchen Penis without lesions.Lowella Fairy without lesions. No cystocele, or rectocele. Pelvic support intact, no discharge. Marland Kitchen  Urethra without masses, tenderness or scarring.Marland Kitchen Lymphatic No adneopathy. No adenopathy. No adenopathy. Musculoskeletal Adexa without tenderness or enlargement.. Digits and nails w/o clubbing, cyanosis, infection, petechiae, ischemia, or inflammatory conditions.Marland Kitchen Psychiatric Judgement and insight Intact.. No evidence of depression, anxiety, or agitation.. General Notes: after sharply debriding the bridge of callus this is now one large ulcer and is to be much easier to dress. Silver  nitrate stick was used for hemostasis Integumentary (Hair, Skin) No suspicious lesions. No crepitus or fluctuance. No peri-wound warmth or erythema. No masses.Marland Kitchen [name] [name], [name] ([id number]) Wound #1 status is Open. Original cause of wound was Gradually Appeared. The wound is located on the Right Toe Great. The wound measures 0.8cm length x 1.9cm width x 0.2cm depth; 1.194cm^2 area and 0.239cm^3 volume. The wound is limited to skin breakdown. There is tunneling at 4:00 with a maximum distance of 1.5cm. There is a medium amount of serosanguineous drainage noted. The wound margin is distinct with the outline attached to the wound base. There is large (67-100%) pink granulation within the wound bed. There is no necrotic tissue within the wound bed. The periwound skin appearance exhibited: Callus, Dry/Scaly, Moist. The periwound skin appearance did not exhibit: Crepitus, Excoriation, Fluctuance, Friable, Induration, Localized Edema, Rash, Scarring, Maceration, Atrophie Blanche, Cyanosis, Ecchymosis, Hemosiderin Staining, Mottled, Pallor, Rubor, Erythema. Periwound temperature was noted as No Abnormality. 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 There was a Skin/Subcutaneous Tissue Debridement [id number]) debridement with total area of 1.52 sq cm performed by Christin Fudge, MD. with the following instrument(s): Forceps and Scissors to remove Viable and Non-Viable tissue/material including Fibrin/Slough, Skin, Callus, and Subcutaneous after achieving pain control using Other (lidocaine 4%). A time out was conducted prior to the start of the procedure. A Minimum amount of bleeding  was controlled with Silver Nitrate. The procedure was tolerated well with a pain level of 0 throughout and a pain level of 1 following the procedure. Post Debridement Measurements: 0.8cm length x 1.7cm width x 0.3cm depth; 0.32cm^3 volume. Post procedure Diagnosis Wound #1: Same as Pre-Procedure General Notes: the 2 ulcerations now have a fairly hard callus bridging the 2 and below the callus is a deeper ulcer and hence I will sharply excise this and make this one large ulcer.. Plan Strickling, [name] ([id number]) Wound Cleansing: Wound #1 Right Toe Great: Clean wound with Normal Saline. Anesthetic: Wound #1 Right Toe Great: Topical Lidocaine 4% cream applied to wound bed prior to debridement Primary Wound Dressing: Wound #1 Right Toe Great: Aquacel Ag Secondary Dressing: Wound #1 Right Toe Great: Gauze and Kerlix/Conform Dressing Change Frequency: Wound #1 Right Toe Great: Change dressing every other day. Follow-up Appointments: Wound #1 Right Toe Great: Return Appointment in 1 week. Off-Loading: Wound #1 Right Toe Great: Other: - DH walking boot I have asked for packing with Aquacel Ag and appropriate foam padding and off loading with an Pegasys shoe. He is very unsteady with her gait and a total contact cast may be difficult. I have spoken to the daughter about getting a DH walking boot and in case the insurance does not cover it they're happy to buy it. I will continue with doxycycline for the next 2 weeks. She will come back and see me  next week Electronic Signature(s) Signed: 01/15/2015 3:18:45 PM By: Christin Fudge MD, FACS Entered By: Christin Fudge on 01/15/2015 15:18:45 [name] [name], [name] ([id number]) -------------------------------------------------------------------------------- SuperBill Details Patient Name: [name] [name], [name] Date of Service: 01/15/2015 Medical Record Number: [id number] Patient Account Number: [id number] Date of Birth/Sex: Feb 19, 1926 (81 y.o.  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

## 2015-01-22 ENCOUNTER — Encounter: Payer: Medicare PPO | Attending: Surgery | Admitting: Surgery

## 2015-01-22 DIAGNOSIS — E669 Obesity, unspecified: Secondary | ICD-10-CM | POA: Diagnosis not present

## 2015-01-22 DIAGNOSIS — N183 Chronic kidney disease, stage 3 (moderate): Secondary | ICD-10-CM | POA: Insufficient documentation

## 2015-01-22 DIAGNOSIS — M109 Gout, unspecified: Secondary | ICD-10-CM | POA: Diagnosis not present

## 2015-01-22 DIAGNOSIS — E7800 Pure hypercholesterolemia, unspecified: Secondary | ICD-10-CM | POA: Diagnosis not present

## 2015-01-22 DIAGNOSIS — I87301 Chronic venous hypertension (idiopathic) without complications of right lower extremity: Secondary | ICD-10-CM | POA: Diagnosis not present

## 2015-01-22 DIAGNOSIS — M1990 Unspecified osteoarthritis, unspecified site: Secondary | ICD-10-CM | POA: Insufficient documentation

## 2015-01-22 DIAGNOSIS — F419 Anxiety disorder, unspecified: Secondary | ICD-10-CM | POA: Insufficient documentation

## 2015-01-22 DIAGNOSIS — D649 Anemia, unspecified: Secondary | ICD-10-CM | POA: Diagnosis not present

## 2015-01-22 DIAGNOSIS — I2510 Atherosclerotic heart disease of native coronary artery without angina pectoris: Secondary | ICD-10-CM | POA: Diagnosis not present

## 2015-01-22 DIAGNOSIS — I129 Hypertensive chronic kidney disease with stage 1 through stage 4 chronic kidney disease, or unspecified chronic kidney disease: Secondary | ICD-10-CM | POA: Diagnosis not present

## 2015-01-22 DIAGNOSIS — L97512 Non-pressure chronic ulcer of other part of right foot with fat layer exposed: Secondary | ICD-10-CM | POA: Diagnosis not present

## 2015-01-22 DIAGNOSIS — Z87891 Personal history of nicotine dependence: Secondary | ICD-10-CM | POA: Diagnosis not present

## 2015-01-22 DIAGNOSIS — G603 Idiopathic progressive neuropathy: Secondary | ICD-10-CM | POA: Diagnosis not present

## 2015-01-22 NOTE — Progress Notes (Addendum)
Archambeau, Reonna L. ([id number]) Visit Report for 01/22/2015 Chief Complaint Document Details Patient Name: [name] [name], [name] [name] 01/22/2015 10:15 Date of Service: AM Medical Record [id number] Number: Patient Account Number: [id number] 07/02/1926 (81 y.o. Treating RN: Macarthur Critchley Date of Birth/Sex: Female) Other Clinician: Primary Care Physician: [name] Treating Christin Fudge Referring Physician: [name] Physician/Extender: Suella Grove in Treatment: 2 Information Obtained from: Patient Chief Complaint Patient presents to the wound care center for a consult due non healing wound. She has had a nonhealing wound on the right great toe since July of this year Electronic Signature(s) Signed: 01/22/2015 11:08:08 AM By: Christin Fudge MD, FACS Entered By: Christin Fudge on 01/22/2015 11:08:08 Lippens, Kiaira L. ([id number]) -------------------------------------------------------------------------------- Debridement Details Patient Name: [name] [name], [name] [name] 01/22/2015 10:15 Date of Service: AM Medical Record [id number] Number: Patient Account Number: [id number] 1926-03-02 (81 y.o. Treating RN: Macarthur Critchley Date of Birth/Sex: Female) Other Clinician: Primary Care Physician: [name] Treating Alfio Loescher Referring Physician: [name] Physician/Extender: Suella Grove in Treatment: 2 Debridement Performed for Wound #1 Right Toe Great Assessment: Performed By: Physician Christin Fudge, MD Debridement: Debridement Pre-procedure Yes Verification/Time Out Taken: Start Time: 11:00 Pain Control: Lidocaine 4% Topical Solution Level: Skin/Subcutaneous Tissue Total Area Debrided (L x 0.6 (cm) x 1.1 (cm) = 0.66 (cm) W): Tissue and other Viable, Non-Viable, Callus, Fibrin/Slough, Subcutaneous material debrided: Instrument: Curette Bleeding: Minimum Hemostasis Achieved: Pressure End Time: 11:04 Procedural Pain: 0 Post Procedural Pain: 0 Response to Treatment: Procedure  was tolerated well Post Debridement Measurements of Total Wound Length: (cm) 0.7 Width: (cm) 1.2 Depth: (cm) 0.1 Volume: (cm) 0.066 Post Procedure Diagnosis Same as Pre-procedure Electronic Signature(s) Signed: 01/22/2015 11:08:02 AM By: Christin Fudge MD, FACS Signed: 01/22/2015 4:20:52 PM By: Rebecca Eaton RN, Sendra Entered By: Christin Fudge on 01/22/2015 11:08:02 Bocchino, Sybol L. ([id number]) -------------------------------------------------------------------------------- HPI Details Patient Name: [name] [name], [name] [name] 01/22/2015 10:15 Date of Service: AM Medical Record [id number] Number: Patient Account Number: [id number] 17-Apr-1926 (81 y.o. Treating RN: Macarthur Critchley Date of Birth/Sex: Female) Other Clinician: Primary Care Physician: [name] Treating Christin Fudge Referring Physician: [name] Physician/Extender: Suella Grove in Treatment: 2 History of Present Illness Location: ulcer on the right big toe Quality: Patient reports experiencing a dull pain to affected area(s). Severity: Patient states wound are getting worse. Duration: Patient has had the wound for > 3 months prior to seeking treatment at the wound center Timing: Pain in wound is Intermittent (comes and goes Context: The wound appeared gradually over time Modifying Factors: Other treatment(s) tried include: has been treated with doxycycline and prior to that Keflex Associated Signs and Symptoms: Patient reports having difficulty standing for long periods. HPI Description: 81 year old patient was recently seen by the PCPs office for significant pain right great toe which has been going on since July. She was initially treated with Keflex which she did not complete and after the office visit this time she has been put on doxycycline. at the time of her visit she was found to have a ulcer on the plantar surface of the right great toe and also had a pyogenic granuloma over this area. X-ray of the right foot  done 12/29/2014 -- IMPRESSION:Soft-tissue swelling and ulceration right great toe. No underlying bony lytic lesion identified. If osteomyelitis remains of clinical concern MRI can be obtained. Past medical history significant for anemia, chronic kidney disease stage III, obesity, varicose veins, coronary artery disease, gout, history of nicotine addiction given up smoking in 2002, hypertension, status post cardiac catheterization, status post abdominal hysterectomy, cholecystectomy and tonsillectomy.  hemoglobin A1c done in August was 5.8 01/22/2015 -- at this stage the [facility] Walking boat was going to cost them significant amount of money and they want to defer using that at the present time. Electronic Signature(s) Signed: 01/22/2015 11:08:52 AM By: Christin Fudge MD, FACS Entered By: Christin Fudge on 01/22/2015 11:08:52 Rotan, Marajade L. ([id number]) -------------------------------------------------------------------------------- Physical Exam Details Patient Name: [name] [name], [name] [name] 01/22/2015 10:15 Date of Service: AM Medical Record [id number] Number: Patient Account Number: [id number] 1926/06/10 (81 y.o. Treating RN: Macarthur Critchley Date of Birth/Sex: Female) Other Clinician: Primary Care Physician: [name] Treating Christin Fudge Referring Physician: [name] Physician/Extender: Suella Grove in Treatment: 2 Constitutional . Pulse regular. Respirations normal and unlabored. Afebrile. . Eyes Nonicteric. Reactive to light. Ears, Nose, Mouth, and Throat Lips, teeth, and gums WNL.Marland Kitchen Moist mucosa without lesions . Neck supple and nontender. No palpable supraclavicular or cervical adenopathy. Normal sized without goiter. Respiratory WNL. No retractions.. Cardiovascular Pedal Pulses WNL. No clubbing, cyanosis or edema. Chest Breasts symmetical and no nipple discharge.. Breast tissue WNL, no masses, lumps, or tenderness.. Lymphatic No adneopathy. No adenopathy. No  adenopathy. Musculoskeletal Adexa without tenderness or enlargement.. Digits and nails w/o clubbing, cyanosis, infection, petechiae, ischemia, or inflammatory conditions.. Integumentary (Hair, Skin) No suspicious lesions. No crepitus or fluctuance. No peri-wound warmth or erythema. No masses.Marland Kitchen Psychiatric Judgement and insight Intact.. No evidence of depression, anxiety, or agitation.. Notes the ulcer is looking much cleaner and the surrounding callus will eat some sharp debridement today. Electronic Signature(s) Signed: 01/22/2015 11:09:21 AM By: Christin Fudge MD, FACS Entered By: Christin Fudge on 01/22/2015 11:09:20 Denny, Kalynn L. ([id number]) -------------------------------------------------------------------------------- Physician Orders Details Patient Name: [name] [name], [name] [name] 01/22/2015 10:15 Date of Service: AM Medical Record [id number] Number: Patient Account Number: [id number] 1926/11/25 (81 y.o. Treating RN: Macarthur Critchley Date of Birth/Sex: Female) Other Clinician: Primary Care Physician: [name] Treating Christin Fudge Referring Physician: [name] Physician/Extender: Suella Grove in Treatment: 2 Verbal / Phone Orders: Yes Clinician: Macarthur Critchley Read Back and Verified: No Diagnosis Coding Wound Cleansing Wound #1 Right Toe Great o Clean wound with Normal Saline. Anesthetic Wound #1 Right Toe Great o Topical Lidocaine 4% cream applied to wound bed prior to debridement Primary Wound Dressing Wound #1 Right Toe Great o Aquacel Ag Secondary Dressing Wound #1 Right Toe Great o Gauze and Kerlix/Conform Dressing Change Frequency Wound #1 Right Toe Great o Change dressing every other day. Follow-up Appointments Wound #1 Right Toe Great o Return Appointment in 1 week. Off-Loading Wound #1 Right Toe Great o Other: - DH walking boot Consults o Vascular - refer to [facility] for venous duplex right leg Older, Chalise L.  ([id number]) oooo Electronic Signature(s) Signed: 01/22/2015 4:20:52 PM By: Ardean Larsen Signed: 01/22/2015 4:22:10 PM By: Christin Fudge MD, FACS Entered By: Rebecca Eaton RN, Sendra on 01/22/2015 11:05:40 Stankovic, Kamilya L. ([id number]) -------------------------------------------------------------------------------- Problem List Details Patient Name: [name] [name], [name] [name] 01/22/2015 10:15 Date of Service: AM Medical Record [id number] Number: Patient Account Number: [id number] 11-30-1926 (81 y.o. 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 ([id number]) -------------------------------------------------------------------------------- Progress Note Details Patient Name: [name] [name], [name] [name] 01/22/2015 10:15 Date of Service: AM Medical Record [id number] Number: Patient Account Number: [id number] 11/28/1926 (81 y.o. Treating RN: Macarthur Critchley Date of Birth/Sex: Female) Other Clinician: Primary Care Physician: [name] Treating Christin Fudge Referring Physician: [name] Physician/Extender: Suella Grove in Treatment: 2 Subjective Chief Complaint Information obtained from Patient Patient presents to the wound care center for a consult due non healing wound. She has had a nonhealing wound  on the right great toe since July of this year History of Present Illness (HPI) The following HPI elements were documented for the patient's wound: Location: ulcer on the right big toe Quality: Patient reports experiencing a dull pain to affected area(s). Severity: Patient states wound are getting worse. Duration: Patient has had the wound for > 3 months prior to seeking treatment at the wound center Timing: Pain in wound is Intermittent (comes and goes Context: The wound appeared gradually over time Modifying Factors: Other treatment(s) tried include: has been treated with doxycycline and prior to that Keflex Associated Signs and Symptoms: Patient reports having difficulty standing for long periods. 81 year old patient was recently seen by the PCPs office for significant pain right great toe which has been going on since July. She was initially treated with Keflex which she did not complete and after the office visit this time she has been put on doxycycline. at the time of her visit she was found to have a ulcer on the plantar surface of the right great toe and also had a pyogenic granuloma over this area. X-ray of the right foot done 12/29/2014 -- IMPRESSION:Soft-tissue swelling and ulceration right great toe. No underlying bony lytic lesion identified. If osteomyelitis remains of clinical concern MRI can be obtained. Past medical history significant for anemia, chronic kidney disease stage III, obesity, varicose veins, coronary artery disease, gout, history of nicotine addiction given up smoking in 2002, hypertension, status post cardiac catheterization, status post abdominal hysterectomy, cholecystectomy and tonsillectomy. hemoglobin A1c done in August was 5.8 01/22/2015 -- at this stage the [facility] Walking boat was going to cost them significant amount of money and they want to defer using that at the present time. [name] [name], [name] ([id number]) Objective Constitutional Pulse regular.  Respirations normal and unlabored. Afebrile. Vitals Time Taken: 10:45 AM, Height: 58 in, Weight: 155 lbs, BMI: 32.4, Temperature: 98.0 F, Pulse: 72 bpm, Respiratory Rate: 16 breaths/min, Blood Pressure: 174/68 mmHg, Pulse Oximetry: 98 %. Eyes Nonicteric. Reactive to light. Ears, Nose, Mouth, and Throat Lips, teeth, and gums WNL.Marland Kitchen Moist mucosa without lesions . Neck supple and nontender. No palpable supraclavicular or cervical adenopathy. Normal sized without goiter. Respiratory WNL. No retractions.. Cardiovascular Pedal Pulses WNL. No clubbing, cyanosis or edema. Chest Breasts symmetical and no nipple discharge.. Breast tissue WNL, no masses, lumps, or tenderness.. Lymphatic No adneopathy. No adenopathy. No adenopathy. Musculoskeletal Adexa without tenderness or enlargement.. Digits and nails w/o clubbing, cyanosis, infection, petechiae, ischemia, or inflammatory conditions.Marland Kitchen Psychiatric Judgement and insight Intact.. No evidence of depression, anxiety, or agitation.. General Notes: the ulcer is looking much cleaner and the surrounding callus will eat some sharp debridement today. Integumentary (Hair, Skin) No suspicious lesions. No crepitus or fluctuance. No peri-wound warmth or erythema. No masses.. Wound #1 status is Open. Original cause of wound was Gradually Appeared. The wound is located on the Right Toe Great. The wound measures 0.6cm length x 1.1cm width x 0.1cm depth; 0.518cm^2 area and 0.052cm^3 volume.  The wound is limited to skin breakdown. There is no tunneling or undermining noted. There is a small amount of serous drainage noted. The wound margin is distinct with the outline attached to the wound base. There is large (67-100%) pink granulation within the wound bed. There is no necrotic [name] [name], [name] ([id number]) tissue within the wound bed. The periwound skin appearance exhibited: Callus. The periwound skin appearance did not exhibit: Crepitus, Excoriation,  Fluctuance, Friable, Induration, Localized Edema, Rash, Scarring, Dry/Scaly, Maceration, Moist, Atrophie Blanche, Cyanosis, Ecchymosis, Hemosiderin Staining, Mottled, Pallor, Rubor, Erythema. Periwound temperature was noted as No Abnormality. 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 She is very unsteady with her gait and a total contact cast may be difficult. I have spoken to the daughter about getting a DH walking boot and at present they want to defer buying it. Due to the fact that she has significant varicose veins I'm going to go ahead and get a venous duplex study done to look for reflux. Procedures Wound #1 Wound #1 is a Trauma, Other located on the Right Toe Great . There was a Skin/Subcutaneous Tissue Debridement [id number]) debridement with total area of 0.66 sq cm performed by Christin Fudge, MD. with the following instrument(s): Curette to remove Viable and Non-Viable tissue/material including Fibrin/Slough, Callus, and Subcutaneous after achieving pain control using Lidocaine 4% Topical Solution. A time out was conducted prior to the start of the procedure. A Minimum amount of bleeding was controlled with Pressure. The procedure was tolerated well with a pain level of 0 throughout and a pain level of 0 following the procedure. Post Debridement Measurements: 0.7cm length x 1.2cm width x 0.1cm depth; 0.066cm^3 volume. Post procedure Diagnosis Wound #1: Same as Pre-Procedure [name] [name], [name] ([id number]) Plan Wound Cleansing: Wound #1 Right Toe Great: Clean wound with Normal Saline. Anesthetic: Wound #1 Right Toe Great: Topical Lidocaine 4% cream  applied to wound bed prior to debridement Primary Wound Dressing: Wound #1 Right Toe Great: Aquacel Ag Secondary Dressing: Wound #1 Right Toe Great: Gauze and Kerlix/Conform Dressing Change Frequency: Wound #1 Right Toe Great: Change dressing every other day. Follow-up Appointments: Wound #1 Right Toe Great: Return Appointment in 1 week. Off-Loading: Wound #1 Right Toe Great: Other: - DH walking boot Consults ordered were: Vascular - refer to [facility] for venous duplex right leg I have asked for packing with Aquacel Ag and appropriate foam padding and off loading with an Pegasys shoe. She is very unsteady with her gait and a total contact cast may be difficult. I have spoken to the daughter about getting a DH walking boot and at present they want to defer buying it. Due to the fact that she has significant varicose veins I'm going to go ahead and get a venous duplex study done to look for reflux. Electronic Signature(s) Signed: 01/24/2015 3:52:18 PM By: Christin Fudge MD, FACS Previous Signature: 01/22/2015 11:10:19 AM Version By: Christin Fudge MD, FACS Entered By: Christin Fudge on 01/24/2015 15:49:34 [name] [name], [name] ([id number]) -------------------------------------------------------------------------------- SuperBill Details Patient Name: [name] [name], [name] Date of Service: 01/22/2015 Medical Record Number: [id number] Patient Account Number: [id number] Date of Birth/Sex: September 12, 1926 (81 y.o. 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 I87.301 Chronic venous hypertension (idiopathic) without complications of right lower extremity Facility Procedures CPT4:  Description Modifier Quantity Code [id number] - DEB SUBQ TISSUE 20 SQ CM/< 1 ICD-10 Description Diagnosis L97.512 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 I87.301  Chronic venous hypertension (idiopathic) without complications of right lower extremity Physician Procedures CPT4: Description Modifier Quantity Code [id number] - WC PHYS SUBQ TISS 20 SQ CM 1 ICD-10 Description Diagnosis L97.512 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

## 2015-01-22 NOTE — Progress Notes (Addendum)
[name] [name] L. ([id number]) Visit Report for 01/22/2015 Arrival Information Details Patient Name: [name] [name]. Date of Service: 01/22/2015 10:15 AM Medical Record Number: [id number] Patient Account Number: [id number] Date of Birth/Sex: [name] (81 y.o. Female) Treating RN: [name] [name] Primary Care Physician: [name] [name] Other Clinician: Referring Physician: Lelon [name] Treating Physician/Extender: [name] [name] in Treatment: 2 Visit Information History Since Last Visit All ordered tests and consults were completed: No Patient Arrived: [name] [name] Added or deleted any medications: No Arrival Time: 10:40 Any new allergies or adverse reactions: No Accompanied By: daughter Had a fall or experienced change in No Transfer Assistance: None activities of daily living that may affect Patient Identification Verified: Yes risk of falls: Secondary Verification Process Yes Signs or symptoms of abuse/neglect since last No Completed: visito Patient Requires Transmission-Based No Has Dressing in Place as Prescribed: Yes Precautions: Pain Present Now: No Patient Has Alerts: No Electronic Signature(s) Signed: 01/22/2015 4:20:52 PM By: [name] Eaton RN, [name] [name] Entered By: [name] Eaton RN, [name] [name] on 01/22/2015 10:44:11 [name] [name] L. ([id number]) -------------------------------------------------------------------------------- Encounter Discharge Information Details Patient Name: [name] [name] L. Date of Service: 01/22/2015 10:15 AM Medical Record Number: [id number] Patient Account Number: [id number] Date of Birth/Sex: [name] (81 y.o. Female) Treating RN: [name] [name] Primary Care Physician: [name] [name] Other Clinician: Referring Physician: Lelon [name] Treating Physician/Extender: [name] [name] in Treatment: 2 Encounter Discharge Information Items Discharge Pain Level: 0 Discharge Condition: Stable Ambulatory Status: Walker Discharge Destination:  Home Transportation: Private Auto Accompanied By: daughter Schedule Follow-up Appointment: Yes Medication Reconciliation completed Yes and provided to Patient/Care [name] [name]: Provided on Clinical Summary of Care: 01/22/2015 Form Type Recipient Paper Patient IM Electronic Signature(s) Signed: 01/22/2015 11:11:53 AM By: [name] [name] Entered By: [name] [name] on 01/22/2015 11:11:53 [name] [name] L. ([id number]) -------------------------------------------------------------------------------- Lower Extremity Assessment Details Patient Name: [name] [name] L. Date of Service: 01/22/2015 10:15 AM Medical Record Number: [id number] Patient Account Number: [id number] Date of Birth/Sex: [birth year] (81 y.o. Female) Treating RN: [name] [name] Primary Care Physician: [name] [name] Other Clinician: Referring Physician: Lelon [name] Treating Physician/Extender: [name] [name] in Treatment: 2 Vascular Assessment Pulses: Posterior Tibial Dorsalis Pedis Palpable: [Right:Yes] Extremity colors, hair growth, and conditions: Hair Growth on Extremity: [Right:No] Temperature of Extremity: [Right:Warm] Capillary Refill: [Right:< 3 seconds] Toe Nail Assessment Left: Right: Thick: No Discolored: No Deformed: No Improper Length and Hygiene: No Electronic Signature(s) Signed: 01/22/2015 4:20:52 PM By: [name] Eaton RN, [name] [name] Entered By: [name] Eaton RN, [name] [name] on 01/22/2015 10:53:07 [name] [name] L. ([id number]) -------------------------------------------------------------------------------- Multi Wound Chart Details Patient Name: [name] [name] L. Date of Service: 01/22/2015 10:15 AM Medical Record Number: [id number] Patient Account Number: [id number] Date of Birth/Sex: [name] [name] (81 y.o. Female) Treating RN: [name] [name] Primary Care Physician: [name] [name] Other Clinician: Referring Physician: Lelon [name] Treating Physician/Extender: [name] [name] in Treatment: 2 Vital  Signs Height(in): 58 Pulse(bpm): 72 Weight(lbs): 155 Blood Pressure 174/68 (mmHg): Body Mass Index(BMI): 32 Temperature(F): 98.0 Respiratory Rate 16 (breaths/min): Photos: [1:No Photos] [N/A:N/A] Wound Location: [1:Right Toe Great] [N/A:N/A] Wounding Event: [1:Gradually Appeared] [N/A:N/A] Primary Etiology: [1:Trauma, Other] [N/A:N/A] Comorbid History: [1:Cataracts, Hypertension, Gout, Osteoarthritis] [N/A:N/A] Date Acquired: [1:11/20/2014] [N/A:N/A] Weeks of Treatment: [1:2] [N/A:N/A] Wound Status: [1:Open] [N/A:N/A] Measurements L x W x D 0.6x1.1x0.1 [N/A:N/A] (cm) Area (cm) : [1:0.518] [N/A:N/A] Volume (cm) : [1:0.052] [N/A:N/A] % Reduction in Area: [1:89.70%] [N/A:N/A] % Reduction in Volume: 94.80% [N/A:N/A] Classification: [1:Full Thickness Without Exposed Support Structures] [N/A:N/A] Exudate Amount: [1:Small] [N/A:N/A] Exudate Type: [1:Serous] [N/A:N/A] Exudate Color: [1:amber] [N/A:N/A] Wound Margin: [1:Distinct, outline attached] [N/A:N/A] Granulation Amount: [1:Large (67-100%)] [  N/A:N/A] Granulation Quality: [1:Pink] [N/A:N/A] Necrotic Amount: [1:None Present (0%)] [N/A:N/A] Exposed Structures: [1:Fascia: No Fat: No Tendon: No Muscle: No Joint: No] [N/A:N/A] Bone: No Limited to Skin Breakdown Epithelialization: Small (1-33%) N/A N/A Periwound Skin Texture: Callus: Yes N/A N/A Edema: No Excoriation: No Induration: No Crepitus: No Fluctuance: No Friable: No Rash: No Scarring: No Periwound Skin Maceration: No N/A N/A Moisture: Moist: No Dry/Scaly: No Periwound Skin Color: Atrophie Blanche: No N/A N/A Cyanosis: No Ecchymosis: No Erythema: No Hemosiderin Staining: No Mottled: No Pallor: No Rubor: No Temperature: No Abnormality N/A N/A Tenderness on No N/A N/A Palpation: Wound Preparation: Ulcer Cleansing: N/A N/A Rinsed/Irrigated with Saline Topical Anesthetic Applied: Other: lidocaine 4% Treatment Notes Electronic Signature(s) Signed:  01/22/2015 4:20:52 PM By: [name] Eaton RN, [name] [name] Entered By: [name] Eaton RN, [name] [name] on 01/22/2015 10:55:26 [name], [name] ([id number]) -------------------------------------------------------------------------------- [place] Details Patient Name: [name], [name] Date of Service: 01/22/2015 10:15 AM Medical Record Number: [id number] Patient Account Number: [id number] Date of Birth/Sex: [name] (81 y.o. Female) Treating RN: [name] [name] Primary Care Physician: [name] [name] Other Clinician: Referring Physician: Lelon [name] Treating Physician/Extender: [name] [name] in Treatment: 2 Active Inactive Orientation to the Wound Care Program Nursing Diagnoses: Knowledge deficit related to the wound healing center program Goals: Patient/caregiver will verbalize understanding of the [facility] Date Initiated: 01/05/2015 Goal Status: Active Interventions: Provide education on orientation to the wound center Notes: Wound/Skin Impairment Nursing Diagnoses: Impaired tissue integrity Knowledge deficit related to ulceration/compromised skin integrity Goals: Patient/caregiver will verbalize understanding of skin care regimen Date Initiated: 01/05/2015 Goal Status: Active Ulcer/skin breakdown will have a volume reduction of 30% by week 4 Date Initiated: 01/05/2015 Goal Status: Active Ulcer/skin breakdown will have a volume reduction of 50% by week 8 Date Initiated: 01/05/2015 Goal Status: Active Ulcer/skin breakdown will have a volume reduction of 80% by week 12 Date Initiated: 01/05/2015 Goal Status: Active Ulcer/skin breakdown will heal within 14 weeks Date Initiated: 01/05/2015 [name] [name] L. ([id number]) Goal Status: Active Interventions: Assess patient/caregiver ability to perform ulcer/skin care regimen upon admission and as needed Assess ulceration(s) every visit Provide education on ulcer and skin care Treatment  Activities: Refer to smoking cessation program : 01/22/2015 Skin care regimen initiated : 01/22/2015 Topical wound management initiated : 01/22/2015 Notes: Electronic Signature(s) Signed: 01/22/2015 4:20:52 PM By: [name] Eaton RN, [name] [name] Entered By: [name] Eaton RN, [name] [name] on 01/22/2015 10:55:09 [name] [name] L. ([id number]) -------------------------------------------------------------------------------- Pain Assessment Details Patient Name: Barz, Willistine L. Date of Service: 01/22/2015 10:15 AM Medical Record Number: [id number] Patient Account Number: [id number] Date of Birth/Sex: [name] (81 y.o. Female) Treating RN: [name] [name] Primary Care Physician: [name] [name] Other Clinician: Referring Physician: Lelon [name] Treating Physician/Extender: [name] [name] in Treatment: 2 Active Problems Location of Pain Severity and Description of Pain Patient Has Paino No Site Locations Rate the pain. Current Pain Level: 0 Pain Management and Medication Current Pain Management: Electronic Signature(s) Signed: 01/22/2015 4:20:52 PM By: [name] Eaton RN, [name] [name] Entered By: [name] Eaton RN, [name] [name] on 01/22/2015 10:44:23 [name] [name] L. ([id number]) -------------------------------------------------------------------------------- Patient/Caregiver Education Details Patient Name: Bulman, Riata L. Date of Service: 01/22/2015 10:15 AM Medical Record Number: [id number] Patient Account Number: [id number] Date of Birth/Gender: [name] (81 y.o. Female) Treating RN: [name] [name] Primary Care Physician: [name] [name] Other Clinician: Referring Physician: Lelon [name] Treating Physician/Extender: [name] [name] in Treatment: 2 Education Assessment Education Provided To: Patient Education Topics Provided Wound/Skin Impairment: Handouts: Caring for Your Ulcer, Skin Care Do's and Dont's Methods: Explain/Verbal Responses: State content correctly Electronic Signature(s)  Signed:  01/22/2015 4:20:52 PM By: [name] Eaton RN, Romero Liner By: [name] Eaton RN, [name] [name] on 01/22/2015 10:55:50 Vasconez, [name] L. ([id number]) -------------------------------------------------------------------------------- Wound Assessment Details Patient Name: Granlund, Kelilah L. Date of Service: 01/22/2015 10:15 AM Medical Record Number: [id number] Patient Account Number: [id number] Date of Birth/Sex: [name] (81 y.o. Female) Treating RN: [name] [name] Primary Care Physician: [name] [name] Other Clinician: Referring Physician: Lelon [name] Treating Physician/Extender: [name] [name] in Treatment: 2 Wound Status Wound Number: 1 Primary Trauma, Other Etiology: Wound Location: Right Toe Great Wound Status: Open Wounding Event: Gradually Appeared Comorbid Cataracts, Hypertension, Gout, Date Acquired: 11/20/2014 History: Osteoarthritis Weeks Of Treatment: 2 Clustered Wound: No Photos Wound Measurements Length: (cm) 0.6 Width: (cm) 1.1 Depth: (cm) 0.1 Area: (cm) 0.518 Volume: (cm) 0.052 % Reduction in Area: 89.7% % Reduction in Volume: 94.8% Epithelialization: Small (1-33%) Tunneling: No Undermining: No Wound Description Full Thickness Without Exposed Foul Odor Af Classification: Support Structures Wound Margin: Distinct, outline attached Exudate Small Amount: Exudate Type: Serous Exudate Color: amber ter Cleansing: No Wound Bed Granulation Amount: Large (67-100%) Exposed Structure Granulation Quality: Pink Fascia Exposed: No Necrotic Amount: None Present (0%) Fat Layer Exposed: No Braggs, Lexis L. ([id number]) Tendon Exposed: No Muscle Exposed: No Joint Exposed: No Bone Exposed: No Limited to Skin Breakdown Periwound Skin Texture Texture Color No Abnormalities Noted: No No Abnormalities Noted: No Callus: Yes Atrophie Blanche: No Crepitus: No Cyanosis: No Excoriation: No Ecchymosis: No Fluctuance: No Erythema: No Friable: No Hemosiderin  Staining: No Induration: No Mottled: No Localized Edema: No Pallor: No Rash: No Rubor: No Scarring: No Temperature / Pain Moisture Temperature: No Abnormality No Abnormalities Noted: No Dry / Scaly: No Maceration: No Moist: No Wound Preparation Ulcer Cleansing: Rinsed/Irrigated with Saline Topical Anesthetic Applied: Other: lidocaine 4%, Electronic Signature(s) Signed: 01/23/2015 5:23:40 PM By: Regan Lemming BSN, RN Signed: 02/15/2015 4:17:44 PM By: [name] Eaton RN, [name] [name] Previous Signature: 01/22/2015 4:20:52 PM Version By: [name] Eaton RN, [name] [name] Entered By: Regan Lemming on 01/23/2015 17:23:40 Metter, Kaede L. ([id number]) -------------------------------------------------------------------------------- Vitals Details Patient Name: Cumpton, Roxy L. Date of Service: 01/22/2015 10:15 AM Medical Record Number: [id number] Patient Account Number: [id number] Date of Birth/Sex: [name] (81 y.o. Female) Treating RN: [name] [name] Primary Care Physician: [name] [name] Other Clinician: Referring Physician: Lelon [name] Treating Physician/Extender: [name] [name] in Treatment: 2 Vital Signs Time Taken: 10:45 Temperature (F): 98.0 Height (in): 58 Pulse (bpm): 72 Weight (lbs): 155 Respiratory Rate (breaths/min): 16 Body Mass Index (BMI): 32.4 Blood Pressure (mmHg): 174/68 Reference Range: 80 - 120 mg / dl Pulse Oximetry (%): 98 Electronic Signature(s) Signed: 01/22/2015 4:20:52 PM By: [name] Eaton RN, [name] [name] Entered By: [name] Eaton RN, [name] [name] on 01/22/2015 10:46:35

## 2015-01-23 ENCOUNTER — Encounter: Payer: Self-pay | Admitting: Podiatry

## 2015-01-23 ENCOUNTER — Ambulatory Visit (INDEPENDENT_AMBULATORY_CARE_PROVIDER_SITE_OTHER): Payer: Medicare PPO | Admitting: Podiatry

## 2015-01-23 VITALS — BP 157/74 | HR 74 | Resp 18

## 2015-01-23 DIAGNOSIS — M79676 Pain in unspecified toe(s): Secondary | ICD-10-CM | POA: Diagnosis not present

## 2015-01-23 DIAGNOSIS — B351 Tinea unguium: Secondary | ICD-10-CM | POA: Diagnosis not present

## 2015-01-23 DIAGNOSIS — L97511 Non-pressure chronic ulcer of other part of right foot limited to breakdown of skin: Secondary | ICD-10-CM | POA: Diagnosis not present

## 2015-01-23 NOTE — Progress Notes (Signed)
   Subjective:    Patient ID: [name] [name], female    DOB: 01/30/1927, 81 y.o.   MRN: [id number]  HPI  81 year-old female presents the office today with her daughter for concerns of thick, painful, elongated toenails which they cannot trim herself. Denies any redness or drainage. When she first baby appointment she was originally make the appointment for a wound on the right big toe however since the appointment was made she did follow up with her primary care physician and she is being treated for the wound at the wound care center. She was last seen yesterday. She was recently on doxycycline finished his course yesterday. She denies any pus coming from the wound denies any redness or red streaks or any swelling. No systemic complaints such as fevers, chills, nausea component. No calf pain, chest pain, shortness of breath.  Review of Systems  All other systems reviewed and are negative.      Objective:   Physical Exam General: AAO x3, NAD  Dermatological: Ulcer right medial hallux IPJ measuring about 1 x 0.4 cm and is superficial. There is no surrounding erythema, ascending cellulitis, fluctuance, crepitus, malodor, purulence/drainage. There is no probing, undermining, tunneling. The nails are hypertrophic, dystrophic, brittle, discolored, elongated 10. No swelling erythema or drainage. There is tenderness on palpation along nails 1-5 bilaterally.  Vascular: Dorsalis Pedis artery and Posterior Tibial artery pedal pulses are palpable with immedate capillary fill time. Pedal hair growth present.  There is no pain with calf compression, swelling, warmth, erythema.   Neruologic: Protective sensation slightly decreased with Simms Weinstein monofilament, vibratory sensation intact.  Musculoskeletal: No gross boney pedal deformities bilateral. No pain, crepitus, or limitation noted with foot and ankle range of motion bilateral. Muscular strength 5/5 in all groups tested bilateral.  Gait:  Unassisted, Nonantalgic.      Assessment & Plan:  81 year old female presents the office today for symptomatic onychomycosis however also has right hallux ulceration but she is being treated by the wound care center. -Treatment options discussed including all alternatives, risks, and complications -At today's appointment the wound was evaluated and there does not appear to be infected at this time. Will defer further treatment to the wound care center as she is actively undergoing treatment with them. -Nails debrided 10 without complication/bleeding. -Monitor for any clinical signs or symptoms of infection and directed to call the office immediately should any occur or go to the ER. -Discussed the importance of daily foot inspection. -Follow-up in 3 months for routine care or sooner if any problems arise. In the meantime, encouraged to call the office with any questions, concerns, change in symptoms.   Celesta Gentile, DPM

## 2015-01-24 DIAGNOSIS — L97519 Non-pressure chronic ulcer of other part of right foot with unspecified severity: Secondary | ICD-10-CM | POA: Insufficient documentation

## 2015-01-29 ENCOUNTER — Encounter: Payer: Medicare PPO | Admitting: Surgery

## 2015-01-29 DIAGNOSIS — L97512 Non-pressure chronic ulcer of other part of right foot with fat layer exposed: Secondary | ICD-10-CM | POA: Diagnosis not present

## 2015-01-29 NOTE — Progress Notes (Addendum)
Rickels, Fatumata L. ([id number]) Visit Report for 01/29/2015 Chief Complaint Document Details Patient Name: [name] [name], [name] [name] 01/29/2015 10:45 Date of Service: AM Medical Record [id number] Number: Patient Account Number: [id number] 1926/09/15 (81 y.o. Treating RN: Ahmed Prima Date of Birth/Sex: Female) Other Clinician: Primary Care Physician: [name] Treating Christin Fudge Referring Physician: [name] Physician/Extender: Suella Grove in Treatment: 3 Information Obtained from: Patient Chief Complaint Patient presents to the wound care center for a consult due non healing wound. She has had a nonhealing wound on the right great toe since July of this year Electronic Signature(s) Signed: 01/29/2015 11:47:01 AM By: Christin Fudge MD, FACS Entered By: Christin Fudge on 01/29/2015 11:47:00 Filip, Meda L. ([id number]) -------------------------------------------------------------------------------- HPI Details Patient Name: [name] [name], [name] [name] 01/29/2015 10:45 Date of Service: AM Medical Record [id number] Number: Patient Account Number: [id number] [birth year] (81 y.o. Treating RN: Ahmed Prima Date of Birth/Sex: Female) Other Clinician: Primary Care Physician: [name] Treating Christin Fudge Referring Physician: [name] Physician/Extender: Suella Grove in Treatment: 3 History of Present Illness Location: ulcer on the right big toe Quality: Patient reports experiencing a dull pain to affected area(s). Severity: Patient states wound are getting worse. Duration: Patient has had the wound for > 3 months prior to seeking treatment at the wound center Timing: Pain in wound is Intermittent (comes and goes Context: The wound appeared gradually over time Modifying Factors: Other treatment(s) tried include: has been treated with doxycycline and prior to that Keflex Associated Signs and Symptoms: Patient reports having difficulty standing for long periods. HPI  Description: 81 year old patient was recently seen by the PCPs office for significant pain right great toe which has been going on since July. She was initially treated with Keflex which she did not complete and after the office visit this time she has been put on doxycycline. at the time of her visit she was found to have a ulcer on the plantar surface of the right great toe and also had a pyogenic granuloma over this area. X-ray of the right foot done 12/29/2014 -- IMPRESSION:Soft-tissue swelling and ulceration right great toe. No underlying bony lytic lesion identified. If osteomyelitis remains of clinical concern MRI can be obtained. Past medical history significant for anemia, chronic kidney disease stage III, obesity, varicose veins, coronary artery disease, gout, history of nicotine addiction given up smoking in 2002, hypertension, status post cardiac catheterization, status post abdominal hysterectomy, cholecystectomy and tonsillectomy. hemoglobin A1c done in August was 5.8 01/22/2015 -- at this stage the [facility] Walking boat was going to cost them significant amount of money and they want to defer using that at the present time. 01/29/2015 -- she had a podiatry appointment and they have trimmed her toenails. She has not heard back from the vascular office regarding her venous duplex study and I have asked them to call personally so that they can get the appointment soon. Electronic Signature(s) Signed: 01/29/2015 11:47:33 AM By: Christin Fudge MD, FACS Entered By: Christin Fudge on 01/29/2015 11:47:32 Luedke, Keyondra L. ([id number]) -------------------------------------------------------------------------------- Physical Exam Details Patient Name: [name] [name], [name] [name] 01/29/2015 10:45 Date of Service: AM Medical Record [id number] Number: Patient Account Number: [id number] 11-07-1926 (81 y.o. Treating RN: Ahmed Prima Date of Birth/Sex: Female) Other Clinician: Primary Care Physician:  [name] Treating Christin Fudge Referring Physician: [name] Physician/Extender: Weeks in Treatment: 3 Constitutional . Pulse regular. Respirations normal and unlabored. Afebrile. . Eyes Nonicteric. Reactive to light. Ears, Nose, Mouth, and Throat Lips, teeth, and gums WNL.Marland Kitchen Moist mucosa without lesions . Neck supple and nontender.  No palpable supraclavicular or cervical adenopathy. Normal sized without goiter. Respiratory WNL. No retractions.. Cardiovascular Pedal Pulses WNL. No clubbing, cyanosis or edema. Lymphatic No adneopathy. No adenopathy. No adenopathy. Musculoskeletal Adexa without tenderness or enlargement.. Digits and nails w/o clubbing, cyanosis, infection, petechiae, ischemia, or inflammatory conditions.. Integumentary (Hair, Skin) No suspicious lesions. No crepitus or fluctuance. No peri-wound warmth or erythema. No masses.Marland Kitchen Psychiatric Judgement and insight Intact.. No evidence of depression, anxiety, or agitation.. Notes the ulcerated area on the right big toe is looking excellent with minimal callus and will not need any sharp debridement today. Electronic Signature(s) Signed: 01/29/2015 11:48:22 AM By: Christin Fudge MD, FACS Entered By: Christin Fudge on 01/29/2015 11:48:21 [name] [name], [name] ([id number]) -------------------------------------------------------------------------------- Physician Orders Details Patient Name: [name] [name], [name] [name] 01/29/2015 10:45 Date of Service: AM Medical Record [id number] Number: Patient Account Number: [id number] 09-06-1926 (81 y.o. 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 Anesthetic Wound #1 Right Toe Great o Topical Lidocaine 4% cream applied to wound bed prior to debridement Primary Wound Dressing Wound #1 Right Toe Great o Aquacel Ag Secondary Dressing Wound #1 Right Toe Great o Gauze and Kerlix/Conform Dressing Change Frequency Wound #1 Right Toe Great o Change dressing every other day. Follow-up Appointments Wound #1 Right Toe Great o Return Appointment in 1 week. Off-Loading Wound #1 Right Toe Great o Other: - DH walking boot [name] [name], [name] ([id number]) Electronic Signature(s) Signed: 01/29/2015 4:36:00 PM By: Christin Fudge MD, FACS Signed: 01/30/2015 3:59:50 PM By: Alric Quan Entered By: Alric Quan on 01/29/2015 11:54:05 [name] [name], [name] ([id number]) -------------------------------------------------------------------------------- Problem List Details Patient Name: [name] [name], [name] [name] 01/29/2015 10:45 Date of Service: AM Medical Record [id number] Number: Patient Account Number: [id number] 1926/11/23 (81 y.o. 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 Treating RN: Ahmed Prima Date of Birth/Sex: Female) Other Clinician: Primary Care Physician: [name] Treating Christin Fudge Referring Physician: [name] Physician/Extender: Suella Grove in Treatment: 3 Subjective Chief Complaint Information obtained from Patient Patient presents to the wound care center for a consult due non healing wound. She has had a nonhealing wound on the right great toe since July of this year History of Present Illness (HPI) The following HPI elements were documented for the patient's wound: Location: ulcer on the right big toe Quality: Patient reports experiencing a dull pain to affected area(s). Severity: Patient states wound are  getting worse. Duration: Patient has had the wound for > 3 months prior to seeking treatment at the wound center Timing: Pain in wound is Intermittent (comes and goes Context: The wound appeared gradually over time Modifying Factors: Other treatment(s) tried include: has been treated with doxycycline and prior to that Keflex Associated Signs and Symptoms: Patient reports having difficulty standing for long periods. 81 year old patient was recently seen by the PCPs office for significant pain right great toe which has been going on since July. She was initially treated with Keflex which she did not complete and after the office visit this time she has been put on doxycycline. at the time of her visit she was found to have a ulcer on the plantar surface of the right great toe and also had a pyogenic granuloma over this area. X-ray of the right  foot done 12/29/2014 -- IMPRESSION:Soft-tissue swelling and ulceration right great toe. No underlying bony lytic lesion identified. If osteomyelitis remains of clinical concern MRI can be obtained. Past medical history significant for anemia, chronic kidney disease stage III, obesity, varicose veins, coronary artery disease, gout, history of nicotine addiction given up smoking in 2002, hypertension, status post cardiac catheterization, status post abdominal hysterectomy, cholecystectomy and tonsillectomy. hemoglobin A1c done in August was 5.8 01/22/2015 -- at this stage the [facility] Walking boat was going to cost them significant amount of money and they want to defer using that at the present time. 01/29/2015 -- she had a podiatry appointment and they have trimmed her toenails. She has not heard back from the vascular office regarding her venous duplex study and I have asked them to call personally so that they can get the appointment soon. [name] [name], [name] ([id number]) Objective Constitutional Pulse regular. Respirations normal and unlabored. Afebrile. Vitals Time Taken: 11:27 AM, Height: 58 in, Weight: 155 lbs, BMI: 32.4, Temperature: 97.9 F, Pulse: 71 bpm, Respiratory Rate: 18 breaths/min, Blood Pressure: 160/68 mmHg. Eyes Nonicteric. Reactive to light. Ears, Nose, Mouth, and Throat Lips, teeth, and gums WNL.Marland Kitchen Moist mucosa without lesions . Neck supple and nontender. No palpable supraclavicular or cervical adenopathy. Normal sized without goiter. Respiratory WNL. No retractions.. Cardiovascular Pedal Pulses WNL. No clubbing, cyanosis or edema. Lymphatic No adneopathy. No adenopathy. No adenopathy. Musculoskeletal Adexa without tenderness or enlargement.. Digits and nails w/o clubbing, cyanosis, infection, petechiae, ischemia, or inflammatory conditions.Marland Kitchen Psychiatric Judgement and insight Intact.. No evidence of depression, anxiety, or agitation.. General Notes: the ulcerated area  on the right big toe is looking excellent with minimal callus and will not need any sharp debridement today. Integumentary (Hair, Skin) No suspicious lesions. No crepitus or fluctuance. No peri-wound warmth or erythema. No masses.. Wound #1 status is Open. Original cause of wound was Gradually Appeared. The wound is located on the Right Toe Great. The wound measures 1cm length x 0.4cm width x 0.1cm depth; 0.314cm^2 area and 0.031cm^3 volume. The wound is limited to skin breakdown. There is no tunneling or undermining noted. There is a small amount of serosanguineous drainage noted. The wound margin is distinct with the outline attached to the wound base. There is large (67-100%) pink granulation within the wound bed. There is no necrotic tissue within the wound bed. The periwound skin appearance exhibited: Callus, Dry/Scaly. The [name] [name], [name] ([id number]) periwound skin appearance did not exhibit: Crepitus, Excoriation, Fluctuance, Friable, Induration, Localized Edema, Rash, Scarring, Maceration, Moist, Atrophie Blanche, Cyanosis, Ecchymosis, Hemosiderin Staining, Mottled, Pallor, Rubor, Erythema. Periwound temperature was noted as No Abnormality. 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 She is very unsteady with her gait and a total contact cast may be difficult. I have spoken to the daughter about calling the vascular office to make sure she gets the appointment for her venous duplex study to look for reflux. She will come back and see me next week. Plan Wound Cleansing: Wound #1 Right Toe Great: Clean wound with Normal Saline. Anesthetic: Wound #1 Right  Toe Great: Topical Lidocaine 4% cream applied to wound bed prior to debridement Primary Wound Dressing: Wound #1 Right Toe Great: Aquacel Ag Secondary Dressing: Wound #1 Right Toe Great: Gauze and Kerlix/Conform Dressing Change Frequency: Wound #1 Right Toe Great: Change dressing every other day. Follow-up Appointments: Wound #1 Right Toe Great: Return Appointment in 1 week. Medlen, Atalaya L. ([id number]) Off-Loading: Wound #1 Right Toe Great: Other: - DH walking boot I have asked for packing with Aquacel Ag and appropriate foam padding and off loading with an Peg Assist shoe. She is very unsteady with her gait and a total contact cast may be difficult. I have spoken to the daughter about calling the vascular office to make sure she gets the appointment for her venous duplex study to look for reflux. She will come back and see me next week. Electronic Signature(s) Signed: 01/30/2015 2:56:52 PM By: Christin Fudge MD, FACS Previous Signature: 01/29/2015 11:49:48 AM Version By: Christin Fudge MD, FACS Entered By: Christin Fudge on 01/30/2015 14:56:51 Shawhan, Kiya L. ([id number]) -------------------------------------------------------------------------------- SuperBill Details Patient Name: Boord, Chade L. Date of Service: 01/29/2015 Medical Record Number: [id number] Patient Account Number: [id number] Date of Birth/Sex: 05/19/1926 (81 y.o. 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 I87.301 Chronic venous hypertension (idiopathic) without complications of right lower extremity Facility Procedures CPT4 Code: [id number] Description: [phone] - WOUND CARE  VISIT-LEV 2 EST PT Modifier: Quantity: 1 Physician Procedures CPT4: Description Modifier Quantity Code [id number] - WC PHYS LEVEL 3 - EST PT 1 ICD-10 Description Diagnosis L97.512 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

## 2015-01-31 NOTE — Progress Notes (Signed)
[name], [name] ([id number]) Visit Report for 01/29/2015 Arrival Information Details Patient Name: [name], [name] Date of Service: 01/29/2015 10:45 AM Medical Record Number: [id number] Patient Account Number: [id number] Date of Birth/Sex: [birth year] (81 y.o. Female) Treating RN: [name] [name] Primary Care Physician: [name] [name] Other Clinician: Referring Physician: Lelon [name] Treating Physician/Extender: [name] [name] in Treatment: 3 Visit Information History Since Last Visit All ordered tests and consults were completed: No Patient Arrived: [name] [name] Added or deleted any medications: No Arrival Time: 11:25 Any new allergies or adverse reactions: No Accompanied By: daughter Had a fall or experienced change in No Transfer Assistance: None activities of daily living that may affect Patient Identification Verified: Yes risk of falls: Secondary Verification Process Yes Signs or symptoms of abuse/neglect since last No Completed: visito Patient Requires Transmission-Based No Hospitalized since last visit: No Precautions: Pain Present Now: No Patient Has Alerts: No Electronic Signature(s) Signed: 01/30/2015 3:59:50 PM By: [name] [name] Entered By: [name] [name] on 01/29/2015 11:26:12 [name], [name] ([id number]) -------------------------------------------------------------------------------- Clinic Level of Care Assessment Details Patient Name: [name], [name] Date of Service: 01/29/2015 10:45 AM Medical Record Number: [id number] Patient Account Number: [id number] Date of Birth/Sex: 11-21-1926 (81 y.o. Female) Treating RN: [name] [name] Primary Care Physician: [name] [name] Other Clinician: Referring Physician: Lelon [name] Treating Physician/Extender: [name] [name] in Treatment: 3 Clinic Level of Care Assessment Items TOOL 4 Quantity Score []  - Use when only an EandM is performed on FOLLOW-UP visit 0 ASSESSMENTS - Nursing Assessment /  Reassessment []  - Reassessment of Co-morbidities (includes updates in patient status) 0 X - Reassessment of Adherence to Treatment Plan 1 5 ASSESSMENTS - Wound and Skin Assessment / Reassessment X - Simple Wound Assessment / Reassessment - one wound 1 5 []  - Complex Wound Assessment / Reassessment - multiple wounds 0 []  - Dermatologic / Skin Assessment (not related to wound area) 0 ASSESSMENTS - Focused Assessment []  - Circumferential Edema Measurements - multi extremities 0 []  - Nutritional Assessment / Counseling / Intervention 0 []  - Lower Extremity Assessment (monofilament, tuning fork, pulses) 0 []  - Peripheral Arterial Disease Assessment (using hand held doppler) 0 ASSESSMENTS - Ostomy and/or Continence Assessment and Care []  - Incontinence Assessment and Management 0 []  - Ostomy Care Assessment and Management (repouching, etc.) 0 PROCESS - Coordination of Care X - Simple Patient / Family Education for ongoing care 1 15 []  - Complex (extensive) Patient / Family Education for ongoing care 0 []  - Staff obtains Programmer, systems, Records, Test Results / Process Orders 0 []  - Staff telephones HHA, Nursing Homes / Clarify orders / etc 0 []  - Routine Transfer to another Facility (non-emergent condition) 0 Reinheimer, [name] ([id number]) []  - Routine Hospital Admission (non-emergent condition) 0 []  - New Admissions / Biomedical engineer / Ordering NPWT, Apligraf, etc. 0 []  - Emergency Hospital Admission (emergent condition) 0 X - Simple Discharge Coordination 1 10 []  - Complex (extensive) Discharge Coordination 0 PROCESS - Special Needs []  - Pediatric / Minor Patient Management 0 []  - Isolation Patient Management 0 []  - Hearing / Language / Visual special needs 0 []  - Assessment of Community assistance (transportation, D/C planning, etc.) 0 []  - Additional assistance / Altered mentation 0 []  - Support Surface(s) Assessment (bed, cushion, seat, etc.) 0 INTERVENTIONS - Wound Cleansing /  Measurement X - Simple Wound Cleansing - one wound 1 5 []  - Complex Wound Cleansing - multiple wounds 0 X - Wound Imaging (photographs - any number of wounds) 1 5 []  - Wound  Tracing (instead of photographs) 0 X - Simple Wound Measurement - one wound 1 5 []  - Complex Wound Measurement - multiple wounds 0 INTERVENTIONS - Wound Dressings X - Small Wound Dressing one or multiple wounds 1 10 []  - Medium Wound Dressing one or multiple wounds 0 []  - Large Wound Dressing one or multiple wounds 0 X - Application of Medications - topical 1 5 []  - Application of Medications - injection 0 INTERVENTIONS - Miscellaneous []  - External ear exam 0 [name], [name] ([id number]) []  - Specimen Collection (cultures, biopsies, blood, body fluids, etc.) 0 []  - Specimen(s) / Culture(s) sent or taken to Lab for analysis 0 []  - Patient Transfer (multiple staff / [name] [name] Lift / Similar devices) 0 []  - Simple Staple / Suture removal (25 or less) 0 []  - Complex Staple / Suture removal (26 or more) 0 []  - Hypo / Hyperglycemic Management (close monitor of Blood Glucose) 0 []  - Ankle / Brachial Index (ABI) - do not check if billed separately 0 X - Vital Signs 1 5 Has the patient been seen at the hospital within the last three years: Yes Total Score: 70 Level Of Care: New/Established - Level 2 Electronic Signature(s) Signed: 01/30/2015 3:59:50 PM By: [name] [name] Entered By: [name] [name] on 01/29/2015 11:54:38 [name], [name] ([id number]) -------------------------------------------------------------------------------- Encounter Discharge Information Details Patient Name: [name], [name] Date of Service: 01/29/2015 10:45 AM Medical Record Number: [id number] Patient Account Number: [id number] Date of Birth/Sex: 1926/07/28 (81 y.o. Female) Treating RN: [name] [name] Primary Care Physician: [name] [name] Other Clinician: Referring Physician: Lelon [name] Treating Physician/Extender: [name] [name] in Treatment: 3 Encounter Discharge Information Items Discharge Pain Level: 0 Discharge Condition: Stable Ambulatory Status: Walker Discharge Destination: Home Transportation: Private Auto Accompanied By: daughter Schedule Follow-up Appointment: Yes Medication Reconciliation completed and provided to Patient/Care Yes Saleema Weppler: Provided on Clinical Summary of Care: 01/29/2015 Form Type Recipient Paper Patient IM Electronic Signature(s) Signed: 01/30/2015 3:59:50 PM By: [name] [name] Previous Signature: 01/29/2015 11:53:37 AM Version By: Ruthine Dose Entered By: [name] [name] on 01/29/2015 11:55:44 Bruyere, Caylei L. ([id number]) -------------------------------------------------------------------------------- Lower Extremity Assessment Details Patient Name: [name], [name] Date of Service: 01/29/2015 10:45 AM Medical Record Number: [id number] Patient Account Number: [id number] Date of Birth/Sex: [birth year] (81 y.o. Female) Treating RN: [name] [name] Primary Care Physician: [name] [name] Other Clinician: Referring Physician: Lelon [name] Treating Physician/Extender: [name] [name] in Treatment: 3 Vascular Assessment Pulses: Posterior Tibial Dorsalis Pedis Palpable: [Right:Yes] Extremity colors, hair growth, and conditions: Extremity Color: [Right:Mottled] Hair Growth on Extremity: [Right:Yes] Temperature of Extremity: [Right:Warm] Capillary Refill: [Right:< 3 seconds] Toe Nail Assessment Left: Right: Thick: Yes Discolored: Yes Deformed: No Improper Length and Hygiene: No Electronic Signature(s) Signed: 01/30/2015 3:59:50 PM By: [name] [name] Entered By: [name] [name] on 01/29/2015 11:31:53 [place], [place] ([id number]) -------------------------------------------------------------------------------- Multi Wound Chart Details Patient Name: [name], [name] Date of Service: 01/29/2015 10:45 AM Medical Record Number:  [id number] Patient Account Number: [id number] Date of Birth/Sex: September 01, 1926 (81 y.o. Female) Treating RN: [name] [name] Primary Care Physician: [name] [name] Other Clinician: Referring Physician: Lelon [name] Treating Physician/Extender: [name] [name] in Treatment: 3 Vital Signs Height(in): 58 Pulse(bpm): 71 Weight(lbs): 155 Blood Pressure 160/68 (mmHg): Body Mass Index(BMI): 32 Temperature(F): 97.9 Respiratory Rate 18 (breaths/min): Photos: [1:No Photos] [N/A:N/A] Wound Location: [1:Right Toe Great] [N/A:N/A] Wounding Event: [1:Gradually Appeared] [N/A:N/A] Primary Etiology: [1:Trauma, Other] [N/A:N/A] Comorbid History: [1:Cataracts, Hypertension, Gout, Osteoarthritis] [N/A:N/A] Date Acquired: [1:11/20/2014] [N/A:N/A] Weeks of Treatment: [1:3] [N/A:N/A] Wound Status: [1:Open] [N/A:N/A] Measurements L x  W x D 1x0.4x0.1 [N/A:N/A] (cm) Area (cm) : [1:0.314] [N/A:N/A] Volume (cm) : [1:0.031] [N/A:N/A] % Reduction in Area: [1:93.80%] [N/A:N/A] % Reduction in Volume: 96.90% [N/A:N/A] Classification: [1:Full Thickness Without Exposed Support Structures] [N/A:N/A] Exudate Amount: [1:Small] [N/A:N/A] Exudate Type: [1:Serosanguineous] [N/A:N/A] Exudate Color: [1:red, brown] [N/A:N/A] Wound Margin: [1:Distinct, outline attached] [N/A:N/A] Granulation Amount: [1:Large (67-100%)] [N/A:N/A] Granulation Quality: [1:Pink] [N/A:N/A] Necrotic Amount: [1:None Present (0%)] [N/A:N/A] Exposed Structures: [1:Fascia: No Fat: No Tendon: No Muscle: No Joint: No] [N/A:N/A] Bone: No Limited to Skin Breakdown Epithelialization: Small (1-33%) N/A N/A Periwound Skin Texture: Callus: Yes N/A N/A Edema: No Excoriation: No Induration: No Crepitus: No Fluctuance: No Friable: No Rash: No Scarring: No Periwound Skin Dry/Scaly: Yes N/A N/A Moisture: Maceration: No Moist: No Periwound Skin Color: Atrophie Blanche: No N/A N/A Cyanosis: No Ecchymosis: No Erythema:  No Hemosiderin Staining: No Mottled: No Pallor: No Rubor: No Temperature: No Abnormality N/A N/A Tenderness on No N/A N/A Palpation: Wound Preparation: Ulcer Cleansing: N/A N/A Rinsed/Irrigated with Saline Topical Anesthetic Applied: Other: lidocaine 4% Treatment Notes Electronic Signature(s) Signed: 01/30/2015 3:59:50 PM By: [name] [name] Entered By: [name] [name] on 01/29/2015 11:36:03 Schenk, Jell[place] ([id number]) -------------------------------------------------------------------------------- [place] Details Patient Name: [name], [name] Date of Service: 01/29/2015 10:45 AM Medical Record Number: [id number] Patient Account Number: [id number] Date of Birth/Sex: [birth year] (81 y.o. Female) Treating RN: [name] [name] Primary Care Physician: [name] [name] Other Clinician: Referring Physician: Lelon [name] Treating Physician/Extender: [name] [name] in Treatment: 3 Active Inactive Orientation to the Wound Care Program Nursing Diagnoses: Knowledge deficit related to the wound healing center program Goals: Patient/caregiver will verbalize understanding of the [facility] Date Initiated: 01/05/2015 Goal Status: Active Interventions: Provide education on orientation to the wound center Notes: Wound/Skin Impairment Nursing Diagnoses: Impaired tissue integrity Knowledge deficit related to ulceration/compromised skin integrity Goals: Patient/caregiver will verbalize understanding of skin care regimen Date Initiated: 01/05/2015 Goal Status: Active Ulcer/skin breakdown will have a volume reduction of 30% by week 4 Date Initiated: 01/05/2015 Goal Status: Active Ulcer/skin breakdown will have a volume reduction of 50% by week 8 Date Initiated: 01/05/2015 Goal Status: Active Ulcer/skin breakdown will have a volume reduction of 80% by week 12 Date Initiated: 01/05/2015 Goal Status: Active Ulcer/skin breakdown will  heal within 14 weeks Date Initiated: 01/05/2015 Camper, Marthe L. ([id number]) Goal Status: Active Interventions: Assess patient/caregiver ability to perform ulcer/skin care regimen upon admission and as needed Assess ulceration(s) every visit Provide education on ulcer and skin care Treatment Activities: Refer to smoking cessation program : 01/29/2015 Skin care regimen initiated : 01/29/2015 Topical wound management initiated : 01/29/2015 Notes: Electronic Signature(s) Signed: 01/30/2015 3:59:50 PM By: [name] [name] Entered By: [name] [name] on 01/29/2015 11:35:57 [name], [name] ([id number]) -------------------------------------------------------------------------------- Pain Assessment Details Patient Name: [name], [name] Date of Service: 01/29/2015 10:45 AM Medical Record Number: [id number] Patient Account Number: [id number] Date of Birth/Sex: 11-01-1926 (81 y.o. Female) Treating RN: [name] [name] Primary Care Physician: [name] [name] Other Clinician: Referring Physician: Lelon [name] Treating Physician/Extender: [name] [name] in Treatment: 3 Active Problems Location of Pain Severity and Description of Pain Patient Has Paino No Site Locations Pain Management and Medication Current Pain Management: Electronic Signature(s) Signed: 01/30/2015 3:59:50 PM By: [name] [name] Entered By: [name] [name] on 01/29/2015 11:26:21 Wilds, Sayda L. ([id number]) -------------------------------------------------------------------------------- Patient/Caregiver Education Details Patient Name: Polinski, Basil L. Date of Service: 01/29/2015 10:45 AM Medical Record Number: [id number] Patient Account Number: [id number] Date of Birth/Gender: 1926/05/03 (81 y.o. Female) Treating RN: [name] [name] Primary Care  Physician: [name] [name] Other Clinician: Referring Physician: Lelon [name] Treating Physician/Extender: [name] [name] in Treatment:  3 Education Assessment Education Provided To: Patient and Caregiver Education Topics Provided Wound/Skin Impairment: Handouts: Other: change dressings as ordered Methods: Demonstration, Explain/Verbal Responses: State content correctly Electronic Signature(s) Signed: 01/30/2015 3:59:50 PM By: [name] [name] Entered By: [name] [name] on 01/29/2015 11:56:00 Mathwig, Martesha L. ([id number]) -------------------------------------------------------------------------------- Wound Assessment Details Patient Name: Bob, Chereese L. Date of Service: 01/29/2015 10:45 AM Medical Record Number: [id number] Patient Account Number: [id number] Date of Birth/Sex: 1926-02-26 (81 y.o. Female) Treating RN: [name] [name] Primary Care Physician: [name] [name] Other Clinician: Referring Physician: Lelon [name] Treating Physician/Extender: [name] [name] in Treatment: 3 Wound Status Wound Number: 1 Primary Trauma, Other Etiology: Wound Location: Right Toe Great Wound Status: Open Wounding Event: Gradually Appeared Comorbid Cataracts, Hypertension, Gout, Date Acquired: 11/20/2014 History: Osteoarthritis Weeks Of Treatment: 3 Clustered Wound: No Photos Photo Uploaded By: [name] [name] on 01/29/2015 17:53:25 Wound Measurements Length: (cm) 1 Width: (cm) 0.4 Depth: (cm) 0.1 Area: (cm) 0.314 Volume: (cm) 0.031 % Reduction in Area: 93.8% % Reduction in Volume: 96.9% Epithelialization: Small (1-33%) Tunneling: No Undermining: No Wound Description Full Thickness Without Exposed Classification: Support Structures Wound Margin: Distinct, outline attached Exudate Small Amount: Exudate Type: Serosanguineous Exudate Color: red, brown Foul Odor After Cleansing: No Wound Bed Granulation Amount: Large (67-100%) Exposed Structure Granulation Quality: Pink Fascia Exposed: No Necrotic Amount: None Present (0%) Fat Layer Exposed: No Lantz, Glynna L. ([id number]) Tendon  Exposed: No Muscle Exposed: No Joint Exposed: No Bone Exposed: No Limited to Skin Breakdown Periwound Skin Texture Texture Color No Abnormalities Noted: No No Abnormalities Noted: No Callus: Yes Atrophie Blanche: No Crepitus: No Cyanosis: No Excoriation: No Ecchymosis: No Fluctuance: No Erythema: No Friable: No Hemosiderin Staining: No Induration: No Mottled: No Localized Edema: No Pallor: No Rash: No Rubor: No Scarring: No Temperature / Pain Moisture Temperature: No Abnormality No Abnormalities Noted: No Dry / Scaly: Yes Maceration: No Moist: No Wound Preparation Ulcer Cleansing: Rinsed/Irrigated with Saline Topical Anesthetic Applied: Other: lidocaine 4%, Treatment Notes Wound #1 (Right Toe Great) 1. Cleansed with: Clean wound with Normal Saline 2. Anesthetic Topical Lidocaine 4% cream to wound bed prior to debridement 4. Dressing Applied: Aquacel Ag 5. Secondary Dressing Applied Gauze and Kerlix/Conform 7. Secured with Tape Notes darko Engineer, maintenance) Signed: 01/30/2015 3:59:50 PM By: [name] [name] Entered By: [name] [name] on 01/29/2015 11:34:54 Molyneux, Joyell L. ([id number]) Nabor, Saryah L. ([id number]) -------------------------------------------------------------------------------- Vitals Details Patient Name: Drumgoole, Rylan L. Date of Service: 01/29/2015 10:45 AM Medical Record Number: [id number] Patient Account Number: [id number] Date of Birth/Sex: 1926-12-26 (81 y.o. Female) Treating RN: [name] [name] Primary Care Physician: [name] [name] Other Clinician: Referring Physician: Lelon [name] Treating Physician/Extender: [name] [name] in Treatment: 3 Vital Signs Time Taken: 11:27 Temperature (F): 97.9 Height (in): 58 Pulse (bpm): 71 Weight (lbs): 155 Respiratory Rate (breaths/min): 18 Body Mass Index (BMI): 32.4 Blood Pressure (mmHg): 160/68 Reference Range: 80 - 120 mg / dl Electronic Signature(s) Signed:  01/30/2015 3:59:50 PM By: [name] [name] Entered By: [name] [name] on 01/29/2015 11:28:40

## 2015-02-05 ENCOUNTER — Encounter: Payer: Medicare PPO | Admitting: Surgery

## 2015-02-05 DIAGNOSIS — L97512 Non-pressure chronic ulcer of other part of right foot with fat layer exposed: Secondary | ICD-10-CM | POA: Diagnosis not present

## 2015-02-06 NOTE — Progress Notes (Addendum)
Nienhaus, Jumana L. ([id number]) Visit Report for 02/05/2015 Arrival Information Details Patient Name: [name] [name], [name] GUAN. Date of Service: 02/05/2015 10:15 AM Medical Record Number: [id number] Patient Account Number: [id number] Date of Birth/Sex: [birth year] (81 y.o. Female) Treating RN: Montey Hora Primary Care Physician: [name] Other Clinician: Referring Physician: [name] Treating Physician/Extender: Frann Rider in Treatment: 4 Visit Information History Since Last Visit Added or deleted any medications: No Patient Arrived: [name] [name] Any new allergies or adverse reactions: No Arrival Time: 10:25 Had a fall or experienced change in No Accompanied By: dtr activities of daily living that may affect Transfer Assistance: None risk of falls: Patient Identification Verified: Yes Signs or symptoms of abuse/neglect since last No Secondary Verification Process Completed: Yes visito Patient Requires Transmission-Based No Hospitalized since last visit: No Precautions: Pain Present Now: No Patient Has Alerts: No Electronic Signature(s) Signed: 02/05/2015 5:49:02 PM By: Montey Hora Entered By: Montey Hora on 02/05/2015 10:26:06 [name], [name] ([id number]) -------------------------------------------------------------------------------- Clinic Level of Care Assessment Details Patient Name: [name], [name] Date of Service: 02/05/2015 10:15 AM Medical Record Number: [id number] Patient Account Number: [id number] Date of Birth/Sex: Dec 21, 1926 (81 y.o. Female) Treating RN: Montey Hora Primary Care Physician: [name] Other Clinician: Referring Physician: [name] Treating Physician/Extender: Frann Rider in Treatment: 4 Clinic Level of Care Assessment Items TOOL 4 Quantity Score []  - Use when only an EandM is performed on FOLLOW-UP visit 0 ASSESSMENTS - Nursing Assessment / Reassessment X - Reassessment of Co-morbidities (includes  updates in patient status) 1 10 X - Reassessment of Adherence to Treatment Plan 1 5 ASSESSMENTS - Wound and Skin Assessment / Reassessment X - Simple Wound Assessment / Reassessment - one wound 1 5 []  - Complex Wound Assessment / Reassessment - multiple wounds 0 []  - Dermatologic / Skin Assessment (not related to wound area) 0 ASSESSMENTS - Focused Assessment []  - Circumferential Edema Measurements - multi extremities 0 []  - Nutritional Assessment / Counseling / Intervention 0 X - Lower Extremity Assessment (monofilament, tuning fork, pulses) 1 5 []  - Peripheral Arterial Disease Assessment (using hand held doppler) 0 ASSESSMENTS - Ostomy and/or Continence Assessment and Care []  - Incontinence Assessment and Management 0 []  - Ostomy Care Assessment and Management (repouching, etc.) 0 PROCESS - Coordination of Care X - Simple Patient / Family Education for ongoing care 1 15 []  - Complex (extensive) Patient / Family Education for ongoing care 0 []  - Staff obtains Programmer, systems, Records, Test Results / Process Orders 0 []  - Staff telephones HHA, Nursing Homes / Clarify orders / etc 0 []  - Routine Transfer to another Facility (non-emergent condition) 0 [name], [name] ([id number]) []  - Routine Hospital Admission (non-emergent condition) 0 []  - New Admissions / Biomedical engineer / Ordering NPWT, Apligraf, etc. 0 []  - Emergency Hospital Admission (emergent condition) 0 X - Simple Discharge Coordination 1 10 []  - Complex (extensive) Discharge Coordination 0 PROCESS - Special Needs []  - Pediatric / Minor Patient Management 0 []  - Isolation Patient Management 0 []  - Hearing / Language / Visual special needs 0 []  - Assessment of Community assistance (transportation, D/C planning, etc.) 0 []  - Additional assistance / Altered mentation 0 []  - Support Surface(s) Assessment (bed, cushion, seat, etc.) 0 INTERVENTIONS - Wound Cleansing / Measurement X - Simple Wound Cleansing - one wound 1 5 []   - Complex Wound Cleansing - multiple wounds 0 X - Wound Imaging (photographs - any number of wounds) 1 5 []  - Wound Tracing (instead of photographs) 0 X -  Simple Wound Measurement - one wound 1 5 []  - Complex Wound Measurement - multiple wounds 0 INTERVENTIONS - Wound Dressings X - Small Wound Dressing one or multiple wounds 1 10 []  - Medium Wound Dressing one or multiple wounds 0 []  - Large Wound Dressing one or multiple wounds 0 []  - Application of Medications - topical 0 []  - Application of Medications - injection 0 INTERVENTIONS - Miscellaneous []  - External ear exam 0 [name], [name] ([id number]) []  - Specimen Collection (cultures, biopsies, blood, body fluids, etc.) 0 []  - Specimen(s) / Culture(s) sent or taken to Lab for analysis 0 []  - Patient Transfer (multiple staff / Harrel Lemon Lift / Similar devices) 0 []  - Simple Staple / Suture removal (25 or less) 0 []  - Complex Staple / Suture removal (26 or more) 0 []  - Hypo / Hyperglycemic Management (close monitor of Blood Glucose) 0 []  - Ankle / Brachial Index (ABI) - do not check if billed separately 0 X - Vital Signs 1 5 Has the patient been seen at the hospital within the last three years: Yes Total Score: 80 Level Of Care: New/Established - Level 3 Electronic Signature(s) Signed: 02/05/2015 5:49:02 PM By: Montey Hora Entered By: Montey Hora on 02/05/2015 10:44:22 [name], [name] ([id number]) -------------------------------------------------------------------------------- Encounter Discharge Information Details Patient Name: [name], [name] Date of Service: 02/05/2015 10:15 AM Medical Record Number: [id number] Patient Account Number: [id number] Date of Birth/Sex: 07/09/1926 (81 y.o. Female) Treating RN: Montey Hora Primary Care Physician: [name] Other Clinician: Referring Physician: [name] Treating Physician/Extender: Frann Rider in Treatment: 4 Encounter Discharge Information  Items Discharge Pain Level: 0 Discharge Condition: Stable Ambulatory Status: Walker Discharge Destination: Home Transportation: Private Auto Accompanied By: dtr Schedule Follow-up Appointment: Yes Medication Reconciliation completed and provided to Patient/Care No Chaia Ikard: Provided on Clinical Summary of Care: 02/05/2015 Form Type Recipient Paper Patient IM Electronic Signature(s) Signed: 02/05/2015 2:22:31 PM By: Montey Hora Previous Signature: 02/05/2015 10:50:40 AM Version By: Ruthine Dose Entered By: Montey Hora on 02/05/2015 14:22:31 Hamad, Lera L. ([id number]) -------------------------------------------------------------------------------- Lower Extremity Assessment Details Patient Name: Bloxom, Ayanna L. Date of Service: 02/05/2015 10:15 AM Medical Record Number: [id number] Patient Account Number: [id number] Date of Birth/Sex: Jul 07, 1926 (81 y.o. Female) Treating RN: Montey Hora Primary Care Physician: [name] Other Clinician: Referring Physician: [name] Treating Physician/Extender: Frann Rider in Treatment: 4 Edema Assessment Assessed: [Left: No] [Right: No] Edema: [Left: N] [Right: o] Vascular Assessment Pulses: Posterior Tibial Dorsalis Pedis Palpable: [Right:Yes] Extremity colors, hair growth, and conditions: Extremity Color: [Right:Normal] Hair Growth on Extremity: [Right:No] Temperature of Extremity: [Right:Warm] Capillary Refill: [Right:< 3 seconds] Toe Nail Assessment Left: Right: Thick: Yes Discolored: Yes Deformed: Yes Improper Length and Hygiene: No Electronic Signature(s) Signed: 02/05/2015 5:49:02 PM By: Montey Hora Entered By: Montey Hora on 02/05/2015 10:35:33 [name], [name] ([id number]) -------------------------------------------------------------------------------- Multi Wound Chart Details Patient Name: [name], [name] Date of Service: 02/05/2015 10:15 AM Medical Record Number:  [id number] Patient Account Number: [id number] Date of Birth/Sex: September 08, 1926 (81 y.o. Female) Treating RN: Montey Hora Primary Care Physician: [name] Other Clinician: Referring Physician: [name] Treating Physician/Extender: Frann Rider in Treatment: 4 Vital Signs Height(in): 58 Pulse(bpm): 73 Weight(lbs): 155 Blood Pressure 162/76 (mmHg): Body Mass Index(BMI): 32 Temperature(F): 98.5 Respiratory Rate 18 (breaths/min): Photos: [1:No Photos] [N/A:N/A] Wound Location: [1:Right Toe Great] [N/A:N/A] Wounding Event: [1:Gradually Appeared] [N/A:N/A] Primary Etiology: [1:Trauma, Other] [N/A:N/A] Comorbid History: [1:Cataracts, Hypertension, Gout, Osteoarthritis] [N/A:N/A] Date Acquired: [1:11/20/2014] [N/A:N/A] Weeks of Treatment: [1:4] [N/A:N/A] Wound Status: [1:Open] [  N/A:N/A] Measurements L x W x D 0.7x0.4x0.1 [N/A:N/A] (cm) Area (cm) : [1:0.22] [N/A:N/A] Volume (cm) : [1:0.022] [N/A:N/A] % Reduction in Area: [1:95.60%] [N/A:N/A] % Reduction in Volume: 97.80% [N/A:N/A] Classification: [1:Full Thickness Without Exposed Support Structures] [N/A:N/A] Exudate Amount: [1:Small] [N/A:N/A] Exudate Type: [1:Serosanguineous] [N/A:N/A] Exudate Color: [1:red, brown] [N/A:N/A] Wound Margin: [1:Distinct, outline attached] [N/A:N/A] Granulation Amount: [1:Large (67-100%)] [N/A:N/A] Granulation Quality: [1:Pink] [N/A:N/A] Necrotic Amount: [1:None Present (0%)] [N/A:N/A] Exposed Structures: [1:Fascia: No Fat: No Tendon: No Muscle: No Joint: No] [N/A:N/A] Bone: No Limited to Skin Breakdown Epithelialization: Small (1-33%) N/A N/A Periwound Skin Texture: Callus: Yes N/A N/A Edema: No Excoriation: No Induration: No Crepitus: No Fluctuance: No Friable: No Rash: No Scarring: No Periwound Skin Dry/Scaly: Yes N/A N/A Moisture: Maceration: No Moist: No Periwound Skin Color: Atrophie Blanche: No N/A N/A Cyanosis: No Ecchymosis: No Erythema:  No Hemosiderin Staining: No Mottled: No Pallor: No Rubor: No Temperature: No Abnormality N/A N/A Tenderness on No N/A N/A Palpation: Wound Preparation: Ulcer Cleansing: N/A N/A Rinsed/Irrigated with Saline Topical Anesthetic Applied: Other: lidocaine 4% Treatment Notes Electronic Signature(s) Signed: 02/05/2015 5:49:02 PM By: Montey Hora Entered By: Montey Hora on 02/05/2015 10:37:51 [name], [name] ([id number]) -------------------------------------------------------------------------------- [place] Details Patient Name: [name], [name] Date of Service: 02/05/2015 10:15 AM Medical Record Number: [id number] Patient Account Number: [id number] Date of Birth/Sex: 1927/01/05 (81 y.o. Female) Treating RN: Montey Hora Primary Care Physician: [name] Other Clinician: Referring Physician: [name] Treating Physician/Extender: Frann Rider in Treatment: 4 Active Inactive Orientation to the Wound Care Program Nursing Diagnoses: Knowledge deficit related to the wound healing center program Goals: Patient/caregiver will verbalize understanding of the [facility] Program Date Initiated: 01/05/2015 Goal Status: Active Interventions: Provide education on orientation to the wound center Notes: Wound/Skin Impairment Nursing Diagnoses: Impaired tissue integrity Knowledge deficit related to ulceration/compromised skin integrity Goals: Patient/caregiver will verbalize understanding of skin care regimen Date Initiated: 01/05/2015 Goal Status: Active Ulcer/skin breakdown will have a volume reduction of 30% by week 4 Date Initiated: 01/05/2015 Goal Status: Active Ulcer/skin breakdown will have a volume reduction of 50% by week 8 Date Initiated: 01/05/2015 Goal Status: Active Ulcer/skin breakdown will have a volume reduction of 80% by week 12 Date Initiated: 01/05/2015 Goal Status: Active Ulcer/skin breakdown will heal  within 14 weeks Date Initiated: 01/05/2015 [name], [name] ([id number]) Goal Status: Active Interventions: Assess patient/caregiver ability to perform ulcer/skin care regimen upon admission and as needed Assess ulceration(s) every visit Provide education on ulcer and skin care Treatment Activities: Refer to smoking cessation program : 02/05/2015 Skin care regimen initiated : 02/05/2015 Topical wound management initiated : 02/05/2015 Notes: Electronic Signature(s) Signed: 02/05/2015 5:49:02 PM By: Montey Hora Entered By: Montey Hora on 02/05/2015 10:37:45 [name], [name] ([id number]) -------------------------------------------------------------------------------- Patient/Caregiver Education Details Patient Name: [name], [name] Date of Service: 02/05/2015 10:15 AM Medical Record Number: [id number] Patient Account Number: [id number] Date of Birth/Gender: 1926-08-17 (81 y.o. Female) Treating RN: Montey Hora Primary Care Physician: [name] Other Clinician: Referring Physician: [name] Treating Physician/Extender: Frann Rider in Treatment: 4 Education Assessment Education Provided To: Patient and Caregiver Education Topics Provided Wound/Skin Impairment: Handouts: Other: wound care as ordered Methods: Demonstration, Explain/Verbal Responses: State content correctly Electronic Signature(s) Signed: 02/05/2015 2:23:58 PM By: Montey Hora Entered By: Montey Hora on 02/05/2015 14:23:57 Bess, [name] ([id number]) -------------------------------------------------------------------------------- Wound Assessment Details Patient Name: Kooy, Jaice L. Date of Service: 02/05/2015 10:15 AM Medical Record Number: [id number] Patient Account Number: [id number] Date of Birth/Sex: April 25, 1926 (81 y.o.  Female) Treating RN: Montey Hora Primary Care Physician: [name] Other Clinician: Referring Physician: [name] Treating  Physician/Extender: Frann Rider in Treatment: 4 Wound Status Wound Number: 1 Primary Trauma, Other Etiology: Wound Location: Right Toe Great Wound Status: Open Wounding Event: Gradually Appeared Comorbid Cataracts, Hypertension, Gout, Date Acquired: 11/20/2014 History: Osteoarthritis Weeks Of Treatment: 4 Clustered Wound: No Photos Photo Uploaded By: Montey Hora on 02/05/2015 17:16:13 Wound Measurements Length: (cm) 0.7 Width: (cm) 0.4 Depth: (cm) 0.1 Area: (cm) 0.22 Volume: (cm) 0.022 % Reduction in Area: 95.6% % Reduction in Volume: 97.8% Epithelialization: Small (1-33%) Tunneling: No Undermining: No Wound Description Full Thickness Without Exposed Classification: Support Structures Wound Margin: Distinct, outline attached Exudate Small Amount: Exudate Type: Serosanguineous Exudate Color: red, brown Foul Odor After Cleansing: No Wound Bed Granulation Amount: Large (67-100%) Exposed Structure Granulation Quality: Pink Fascia Exposed: No Necrotic Amount: None Present (0%) Fat Layer Exposed: No Sytsma, Mady L. ([id number]) Tendon Exposed: No Muscle Exposed: No Joint Exposed: No Bone Exposed: No Limited to Skin Breakdown Periwound Skin Texture Texture Color No Abnormalities Noted: No No Abnormalities Noted: No Callus: Yes Atrophie Blanche: No Crepitus: No Cyanosis: No Excoriation: No Ecchymosis: No Fluctuance: No Erythema: No Friable: No Hemosiderin Staining: No Induration: No Mottled: No Localized Edema: No Pallor: No Rash: No Rubor: No Scarring: No Temperature / Pain Moisture Temperature: No Abnormality No Abnormalities Noted: No Dry / Scaly: Yes Maceration: No Moist: No Wound Preparation Ulcer Cleansing: Rinsed/Irrigated with Saline Topical Anesthetic Applied: Other: lidocaine 4%, Treatment Notes Wound #1 (Right Toe Great) 1. Cleansed with: Clean wound with Normal Saline 2. Anesthetic Topical Lidocaine 4% cream to  wound bed prior to debridement 4. Dressing Applied: Aquacel Ag 5. Secondary Dressing Applied Gauze and Kerlix/Conform 7. Secured with Tape Notes darco with peg assist Electronic Signature(s) Signed: 02/05/2015 5:49:02 PM By: Montey Hora Entered By: Montey Hora on 02/05/2015 10:35:10 Heuberger, Emara L. ([id number]) Nienaber, Cornell L. ([id number]) -------------------------------------------------------------------------------- Vitals Details Patient Name: Huntsberry, Shirelle L. Date of Service: 02/05/2015 10:15 AM Medical Record Number: [id number] Patient Account Number: [id number] Date of Birth/Sex: 1926-06-29 (81 y.o. Female) Treating RN: Montey Hora Primary Care Physician: [name] Other Clinician: Referring Physician: [name] Treating Physician/Extender: Frann Rider in Treatment: 4 Vital Signs Time Taken: 10:27 Temperature (F): 98.5 Height (in): 58 Pulse (bpm): 73 Weight (lbs): 155 Respiratory Rate (breaths/min): 18 Body Mass Index (BMI): 32.4 Blood Pressure (mmHg): 162/76 Reference Range: 80 - 120 mg / dl Electronic Signature(s) Signed: 02/05/2015 5:49:02 PM By: Montey Hora Entered By: Montey Hora on 02/05/2015 10:29:20

## 2015-02-07 NOTE — Progress Notes (Signed)
Kernen, Vedika L. ([id number]) Visit Report for 02/05/2015 Chief Complaint Document Details Patient Name: [name] [name], [name] [name] 02/05/2015 10:15 Date of Service: AM Medical Record [id number] Number: Patient Account Number: [id number] [birth year] (81 y.o. Treating RN: Montey Hora Date of Birth/Sex: Female) Other Clinician: Primary Care Physician: [name] Treating Christin Fudge Referring Physician: [name] Physician/Extender: Suella Grove in Treatment: 4 Information Obtained from: Patient Chief Complaint Patient presents to the wound care center for a consult due non healing wound. She has had a nonhealing wound on the right great toe since July of this year Electronic Signature(s) Signed: 02/06/2015 12:22:39 PM By: Christin Fudge MD, FACS Entered By: Christin Fudge on 02/06/2015 12:22:39 Shore, Euva L. ([id number]) -------------------------------------------------------------------------------- HPI Details Patient Name: [name] [name], [name] [name] 02/05/2015 10:15 Date of Service: AM Medical Record [id number] Number: Patient Account Number: [id number] [birth year] (81 y.o. Treating RN: Montey Hora Date of Birth/Sex: Female) Other Clinician: Primary Care Physician: [name] Treating Christin Fudge Referring Physician: [name] Physician/Extender: Weeks in Treatment: 4 History of Present Illness Location: ulcer on the right big toe Quality: Patient reports experiencing a dull pain to affected area(s). Severity: Patient states wound are getting worse. Duration: Patient has had the wound for > 3 months prior to seeking treatment at the wound center Timing: Pain in wound is Intermittent (comes and goes Context: The wound appeared gradually over time Modifying Factors: Other treatment(s) tried include: has been treated with doxycycline and prior to that Keflex Associated Signs and Symptoms: Patient reports having difficulty standing for long periods. HPI Description:  81 year old patient was recently seen by the PCPs office for significant pain right great toe which has been going on since July. She was initially treated with Keflex which she did not complete and after the office visit this time she has been put on doxycycline. at the time of her visit she was found to have a ulcer on the plantar surface of the right great toe and also had a pyogenic granuloma over this area. X-ray of the right foot done 12/29/2014 -- IMPRESSION:Soft-tissue swelling and ulceration right great toe. No underlying bony lytic lesion identified. If osteomyelitis remains of clinical concern MRI can be obtained. Past medical history significant for anemia, chronic kidney disease stage III, obesity, varicose veins, coronary artery disease, gout, history of nicotine addiction given up smoking in 2002, hypertension, status post cardiac catheterization, status post abdominal hysterectomy, cholecystectomy and tonsillectomy. hemoglobin A1c done in August was 5.8 01/22/2015 -- at this stage the [facility] Walking boat was going to cost them significant amount of money and they want to defer using that at the present time. 01/29/2015 -- she had a podiatry appointment and they have trimmed her toenails. She has not heard back from the vascular office regarding her venous duplex study and I have asked them to call personally so that they can get the appointment soon. 02/06/2015 -- they have made contact with the vascular office and from what I understand that test has been done but the report is pending. Electronic Signature(s) Signed: 02/06/2015 12:23:51 PM By: Christin Fudge MD, FACS Entered By: Christin Fudge on 02/06/2015 12:23:50 Streiff, Tariah L. ([id number]) -------------------------------------------------------------------------------- Physical Exam Details Patient Name: [name] [name], [name] [name] 02/05/2015 10:15 Date of Service: AM Medical Record [id number] Number: Patient Account Number:  [id number] 01/24/1927 (81 y.o. Treating RN: Montey Hora Date of Birth/Sex: Female) Other Clinician: Primary Care Physician: [name] Treating Christin Fudge Referring Physician: [name] Physician/Extender: Weeks in Treatment: 4 Constitutional . Pulse regular. Respirations normal and unlabored. Afebrile. Marland Kitchen  Eyes Nonicteric. Reactive to light. Ears, Nose, Mouth, and Throat Lips, teeth, and gums WNL.Marland Kitchen Moist mucosa without lesions. Neck supple and nontender. No palpable supraclavicular or cervical adenopathy. Normal sized without goiter. Respiratory WNL. No retractions.. Cardiovascular Pedal Pulses WNL. No clubbing, cyanosis or edema. Chest Breasts symmetical and no nipple discharge.. Breast tissue WNL, no masses, lumps, or tenderness.. Lymphatic No adneopathy. No adenopathy. No adenopathy. Musculoskeletal Adexa without tenderness or enlargement.. Digits and nails w/o clubbing, cyanosis, infection, petechiae, ischemia, or inflammatory conditions.. Integumentary (Hair, Skin) No suspicious lesions. No crepitus or fluctuance. No peri-wound warmth or erythema. No masses.Marland Kitchen Psychiatric Judgement and insight Intact.. No evidence of depression, anxiety, or agitation.. Notes there is excellent resolution of the wound and there is minimal callus. Electronic Signature(s) Signed: 02/06/2015 12:24:23 PM By: Christin Fudge MD, FACS Entered By: Christin Fudge on 02/06/2015 12:24:22 Coggin, Rondia L. ([id number]) -------------------------------------------------------------------------------- Physician Orders Details Patient Name: [name] [name], [name] [name] 02/05/2015 10:15 Date of Service: AM Medical Record [id number] Number: Patient Account Number: [id number] 15-Jun-1926 (81 y.o. Treating RN: Montey Hora Date of Birth/Sex: Female) Other Clinician: Primary Care Physician: [name] Treating Christin Fudge Referring Physician: [name] Physician/Extender: Suella Grove in  Treatment: 4 Verbal / Phone Orders: Yes Clinician: Montey Hora Read Back and Verified: Yes Diagnosis Coding Wound Cleansing Wound #1 Right Toe Great o Clean wound with Normal Saline. Anesthetic Wound #1 Right Toe Great o Topical Lidocaine 4% cream applied to wound bed prior to debridement Primary Wound Dressing Wound #1 Right Toe Great o Aquacel Ag Secondary Dressing Wound #1 Right Toe Great o Gauze and Kerlix/Conform Dressing Change Frequency Wound #1 Right Toe Great o Change dressing every other day. Follow-up Appointments Wound #1 Right Toe Great o Return Appointment in 1 week. Off-Loading Wound #1 Right Toe Great o Other: - DH walking boot Electronic Signature(s) Signed: 02/05/2015 5:49:02 PM By: Montey Hora Signed: 02/06/2015 4:08:32 PM By: Christin Fudge MD, FACS Petite, Bristol L. ([id number]) Entered By: Montey Hora on 02/05/2015 10:43:56 Alas, Yunique L. ([id number]) -------------------------------------------------------------------------------- Problem List Details Patient Name: [name] [name], [name] [name] 02/05/2015 10:15 Date of Service: AM Medical Record [id number] Number: Patient Account Number: [id number] 04/11/1926 (81 y.o. 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 01/05/2015 Yes I87.301 Chronic venous hypertension (idiopathic) without 123456 Yes complications of right lower extremity Inactive Problems Resolved Problems Electronic Signature(s) Signed: 02/06/2015 12:22:33 PM By: Christin Fudge MD, FACS Previous Signature: 02/05/2015 10:46:58 AM  Version By: Christin Fudge MD, FACS Entered By: Christin Fudge on 02/06/2015 12:22:33 [place], [place] ([id number]) -------------------------------------------------------------------------------- Progress Note Details Patient Name: [name] [name], [name] [name] 02/05/2015 10:15 Date of Service: AM Medical Record [id number] Number: Patient Account Number: [id number] 11/28/1926 (81 y.o. Treating RN: Montey Hora Date of Birth/Sex: Female) Other Clinician: Primary Care Physician: [name] Treating Christin Fudge Referring Physician: [name] Physician/Extender: Suella Grove in Treatment: 4 Subjective Chief Complaint Information obtained from Patient Patient presents to the wound care center for a consult due non healing wound. She has had a nonhealing wound on the right great toe since July of this year History of Present Illness (HPI) The following HPI elements were documented for the patient's wound: Location: ulcer on the right big toe Quality: Patient reports experiencing a dull pain to affected  area(s). Severity: Patient states wound are getting worse. Duration: Patient has had the wound for > 3 months prior to seeking treatment at the wound center Timing: Pain in wound is Intermittent (comes and goes Context: The wound appeared gradually over time Modifying Factors: Other treatment(s) tried include: has been treated with doxycycline and prior to that Keflex Associated Signs and Symptoms: Patient reports having difficulty standing for long periods. 81 year old patient was recently seen by the PCPs office for significant pain right great toe which has been going on since July. She was initially treated with Keflex which she did not complete and after the office visit this time she has been put on doxycycline. at the time of her visit she was found to have a ulcer on the plantar surface of the right great toe and also had a pyogenic granuloma over this area. X-ray of the right foot  done 12/29/2014 -- IMPRESSION:Soft-tissue swelling and ulceration right great toe. No underlying bony lytic lesion identified. If osteomyelitis remains of clinical concern MRI can be obtained. Past medical history significant for anemia, chronic kidney disease stage III, obesity, varicose veins, coronary artery disease, gout, history of nicotine addiction given up smoking in 2002, hypertension, status post cardiac catheterization, status post abdominal hysterectomy, cholecystectomy and tonsillectomy. hemoglobin A1c done in August was 5.8 01/22/2015 -- at this stage the [facility] Walking boat was going to cost them significant amount of money and they want to defer using that at the present time. 01/29/2015 -- she had a podiatry appointment and they have trimmed her toenails. She has not heard back from the vascular office regarding her venous duplex study and I have asked them to call personally so that they can get the appointment soon. 02/06/2015 -- they have made contact with the vascular office and from what I understand that test has been done but the report is pending. [name] [name], [name] ([id number]) Objective Constitutional Pulse regular. Respirations normal and unlabored. Afebrile. Vitals Time Taken: 10:27 AM, Height: 58 in, Weight: 155 lbs, BMI: 32.4, Temperature: 98.5 F, Pulse: 73 bpm, Respiratory Rate: 18 breaths/min, Blood Pressure: 162/76 mmHg. Eyes Nonicteric. Reactive to light. Ears, Nose, Mouth, and Throat Lips, teeth, and gums WNL.Marland Kitchen Moist mucosa without lesions. Neck supple and nontender. No palpable supraclavicular or cervical adenopathy. Normal sized without goiter. Respiratory WNL. No retractions.. Cardiovascular Pedal Pulses WNL. No clubbing, cyanosis or edema. Chest Breasts symmetical and no nipple discharge.. Breast tissue WNL, no masses, lumps, or tenderness.. Lymphatic No adneopathy. No adenopathy. No adenopathy. Musculoskeletal Adexa without tenderness or  enlargement.. Digits and nails w/o clubbing, cyanosis, infection, petechiae, ischemia, or inflammatory conditions.Marland Kitchen Psychiatric Judgement and insight Intact.. No evidence of depression, anxiety, or agitation.. General Notes: there is excellent resolution of the wound and there is minimal callus. Integumentary (Hair, Skin) No suspicious lesions. No crepitus or fluctuance. No peri-wound warmth or erythema. No masses.. Wound #1 status is Open. Original cause of wound was Gradually Appeared. The wound is located on the [facility] [place] ([id number]) Right Toe Great. The wound measures 0.7cm length x 0.4cm width x 0.1cm depth; 0.22cm^2 area and 0.022cm^3 volume. The wound is limited to skin breakdown. There is no tunneling or undermining noted. There is a small amount of serosanguineous drainage noted. The wound margin is distinct with the outline attached to the wound base. There is large (67-100%) pink granulation within the wound bed. There is no necrotic tissue within the wound bed. The periwound skin appearance exhibited: Callus, Dry/Scaly. The periwound skin appearance did  not exhibit: Crepitus, Excoriation, Fluctuance, Friable, Induration, Localized Edema, Rash, Scarring, Maceration, Moist, Atrophie Blanche, Cyanosis, Ecchymosis, Hemosiderin Staining, Mottled, Pallor, Rubor, Erythema. Periwound temperature was noted as No Abnormality. 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 Anesthetic: Wound #1 Right Toe Great: Topical Lidocaine 4% cream applied to wound bed prior to debridement Primary Wound Dressing: Wound #1 Right Toe Great: Aquacel Ag Secondary Dressing: Wound #1 Right Toe  Great: Gauze and Kerlix/Conform Dressing Change Frequency: Wound #1 Right Toe Great: Change dressing every other day. Follow-up Appointments: Wound #1 Right Toe Great: Return Appointment in 1 week. Off-Loading: Wound #1 Right Toe Great: Other: - DH walking boot [name] [name], [name] ([id number]) We will continue with Aquacel Ag and offloading boot and she will come back and see [place] next week and anticipate she may be ready for discharge by then. We will also review her vascular report. Electronic Signature(s) Signed: 02/06/2015 12:25:06 PM By: Christin Fudge MD, FACS Entered By: Christin Fudge on 02/06/2015 12:25:06 [name] [name], [name] ([id number]) -------------------------------------------------------------------------------- SuperBill Details Patient Name: Alonzo, Kynedi L. Date of Service: 02/05/2015 Medical Record Number: [id number] Patient Account Number: [id number] Date of Birth/Sex: 1926-06-07 (81 y.o. 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 I87.301 Chronic venous hypertension (idiopathic) without complications of right lower extremity Facility Procedures CPT4 Code: [id number] Description: 99213 - WOUND CARE VISIT-LEV 3 EST PT Modifier: Quantity: 1 Physician Procedures CPT4: Description Modifier Quantity Code [id number] 99213 - WC PHYS LEVEL 3 - EST PT 1 ICD-10 Description Diagnosis 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

## 2015-02-20 ENCOUNTER — Encounter: Payer: Medicare PPO | Attending: Surgery | Admitting: Surgery

## 2015-02-20 DIAGNOSIS — L97512 Non-pressure chronic ulcer of other part of right foot with fat layer exposed: Secondary | ICD-10-CM | POA: Diagnosis not present

## 2015-02-20 DIAGNOSIS — Z87891 Personal history of nicotine dependence: Secondary | ICD-10-CM | POA: Insufficient documentation

## 2015-02-20 DIAGNOSIS — D649 Anemia, unspecified: Secondary | ICD-10-CM | POA: Insufficient documentation

## 2015-02-20 DIAGNOSIS — E669 Obesity, unspecified: Secondary | ICD-10-CM | POA: Insufficient documentation

## 2015-02-20 DIAGNOSIS — I87301 Chronic venous hypertension (idiopathic) without complications of right lower extremity: Secondary | ICD-10-CM | POA: Insufficient documentation

## 2015-02-20 DIAGNOSIS — M109 Gout, unspecified: Secondary | ICD-10-CM | POA: Insufficient documentation

## 2015-02-20 DIAGNOSIS — G603 Idiopathic progressive neuropathy: Secondary | ICD-10-CM | POA: Insufficient documentation

## 2015-02-20 DIAGNOSIS — I10 Essential (primary) hypertension: Secondary | ICD-10-CM | POA: Diagnosis not present

## 2015-02-20 DIAGNOSIS — Z6832 Body mass index (BMI) 32.0-32.9, adult: Secondary | ICD-10-CM | POA: Diagnosis not present

## 2015-02-20 DIAGNOSIS — I2510 Atherosclerotic heart disease of native coronary artery without angina pectoris: Secondary | ICD-10-CM | POA: Diagnosis not present

## 2015-02-20 DIAGNOSIS — S91101D Unspecified open wound of right great toe without damage to nail, subsequent encounter: Secondary | ICD-10-CM | POA: Diagnosis not present

## 2015-02-20 DIAGNOSIS — N183 Chronic kidney disease, stage 3 (moderate): Secondary | ICD-10-CM | POA: Diagnosis not present

## 2015-02-20 DIAGNOSIS — Z9889 Other specified postprocedural states: Secondary | ICD-10-CM | POA: Insufficient documentation

## 2015-02-21 DIAGNOSIS — L97509 Non-pressure chronic ulcer of other part of unspecified foot with unspecified severity: Secondary | ICD-10-CM | POA: Diagnosis not present

## 2015-02-21 NOTE — Progress Notes (Signed)
Kaczorowski, Kemaya L. ([id number]) Visit Report for 02/20/2015 Arrival Information Details Patient Name: [name] [name], [name] Date of Service: 02/20/2015 1:30 PM Medical Record Number: [id number] Patient Account Number: [id number] Date of Birth/Sex: 1927-02-04 (82 y.o. Female) Treating RN: [name] [name] Primary Care Physician: [name] [name] Other Clinician: Referring Physician: Lelon [name] [name] Treating Physician/Extender: [name] [name] in Treatment: 6 Visit Information History Since Last Visit All ordered tests and consults were No Patient Arrived: [name] [name] completed: Arrival Time: 13:28 Added or deleted any medications: No Accompanied By: daughter Any new allergies or adverse reactions: No Transfer Assistance: None Had a fall or experienced change in No Patient Identification Verified: Yes activities of daily living that may affect Secondary Verification Process Yes risk of falls: Completed: Signs or symptoms of abuse/neglect since No Patient Requires Transmission-Based No last visito Precautions: Hospitalized since last visit: No Patient Has Alerts: No Has Dressing in Place as Prescribed: Yes Has Footwear/Offloading in Place as Yes Prescribed: Left: Other:darco Pain Present Now: No Electronic Signature(s) Signed: 02/20/2015 4:12:15 PM By: [name] Eaton, RN, [name] [name] Entered By: [name] Eaton RN, [name] [name] on 02/20/2015 13:29:01 [name], [name] ([id number]) -------------------------------------------------------------------------------- Clinic Level of Care Assessment Details Patient Name: [name], [name] Date of Service: 02/20/2015 1:30 PM Medical Record Number: [id number] Patient Account Number: [id number] Date of Birth/Sex: 1926-04-22 (82 y.o. Female) Treating RN: [name] [name] Primary Care Physician: [name] [name] Other Clinician: Referring Physician: Lelon [name] [name] Treating Physician/Extender: [name] [name] in Treatment: 6 Clinic Level of Care Assessment Items TOOL  4 Quantity Score []  - Use when only an EandM is performed on FOLLOW-UP visit 0 ASSESSMENTS - Nursing Assessment / Reassessment []  - Reassessment of Co-morbidities (includes updates in patient status) 0 X - Reassessment of Adherence to Treatment Plan 1 5 ASSESSMENTS - Wound and Skin Assessment / Reassessment X - Simple Wound Assessment / Reassessment - one wound 1 5 []  - Complex Wound Assessment / Reassessment - multiple wounds 0 []  - Dermatologic / Skin Assessment (not related to wound area) 0 ASSESSMENTS - Focused Assessment []  - Circumferential Edema Measurements - multi extremities 0 []  - Nutritional Assessment / Counseling / Intervention 0 []  - Lower Extremity Assessment (monofilament, tuning fork, pulses) 0 []  - Peripheral Arterial Disease Assessment (using hand held doppler) 0 ASSESSMENTS - Ostomy and/or Continence Assessment and Care []  - Incontinence Assessment and Management 0 []  - Ostomy Care Assessment and Management (repouching, etc.) 0 PROCESS - Coordination of Care X - Simple Patient / Family Education for ongoing care 1 15 []  - Complex (extensive) Patient / Family Education for ongoing care 0 []  - Staff obtains Programmer, systems, Records, Test Results / Process Orders 0 []  - Staff telephones HHA, Nursing Homes / Clarify orders / etc 0 []  - Routine Transfer to another Facility (non-emergent condition) 0 [name], [name] ([id number]) []  - Routine Hospital Admission (non-emergent condition) 0 []  - New Admissions / Biomedical engineer / Ordering NPWT, Apligraf, etc. 0 []  - Emergency Hospital Admission (emergent condition) 0 X - Simple Discharge Coordination 1 10 []  - Complex (extensive) Discharge Coordination 0 PROCESS - Special Needs []  - Pediatric / Minor Patient Management 0 []  - Isolation Patient Management 0 []  - Hearing / Language / Visual special needs 0 []  - Assessment of Community assistance (transportation, [name] [name]/C planning, etc.) 0 []  - Additional assistance / Altered  mentation 0 []  - Support Surface(s) Assessment (bed, cushion, seat, etc.) 0 INTERVENTIONS - Wound Cleansing / Measurement X - Simple Wound Cleansing - one wound 1 5 []  - Complex Wound Cleansing -  multiple wounds 0 X - Wound Imaging (photographs - any number of wounds) 1 5 []  - Wound Tracing (instead of photographs) 0 X - Simple Wound Measurement - one wound 1 5 []  - Complex Wound Measurement - multiple wounds 0 INTERVENTIONS - Wound Dressings []  - Small Wound Dressing one or multiple wounds 0 []  - Medium Wound Dressing one or multiple wounds 0 []  - Large Wound Dressing one or multiple wounds 0 []  - Application of Medications - topical 0 []  - Application of Medications - injection 0 INTERVENTIONS - Miscellaneous []  - External ear exam 0 [name], [name] ([id number]) []  - Specimen Collection (cultures, biopsies, blood, body fluids, etc.) 0 []  - Specimen(s) / Culture(s) sent or taken to Lab for analysis 0 []  - Patient Transfer (multiple staff / Harrel Lemon Lift / Similar devices) 0 []  - Simple Staple / Suture removal (25 or less) 0 []  - Complex Staple / Suture removal (26 or more) 0 []  - Hypo / Hyperglycemic Management (close monitor of Blood Glucose) 0 []  - Ankle / Brachial Index (ABI) - do not check if billed separately 0 X - Vital Signs 1 5 Has the patient been seen at the hospital within the last three years: Yes Total Score: 55 Level Of Care: New/Established - Level 2 Electronic Signature(s) Signed: 02/20/2015 4:12:15 PM By: [name] Eaton, RN, [name] [name] Entered By: [name] Eaton, RN, [name] [name] on 02/20/2015 13:53:33 [name], [name] ([id number]) -------------------------------------------------------------------------------- Encounter Discharge Information Details Patient Name: [name], [name] Date of Service: 02/20/2015 1:30 PM Medical Record Number: [id number] Patient Account Number: [id number] Date of Birth/Sex: 10/06/1926 (82 y.o. Female) Treating RN: [name] [name] Primary Care Physician:  [name] [name] Other Clinician: Referring Physician: Lelon [name] [name] Treating Physician/Extender: [name] [name] in Treatment: 6 Encounter Discharge Information Items Discharge Pain Level: 0 Discharge Condition: Stable Ambulatory Status: [name] [name] Discharge Destination: Home Private Transportation: Auto Accompanied By: daughter Schedule Follow-up Appointment: No Medication Reconciliation completed and Yes provided to Patient/Care [name] [name]: Clinical Summary of Care: Electronic Signature(s) Signed: 02/20/2015 4:12:15 PM By: [name] Eaton RN, [name] [name] Entered By: [name] Eaton, RN, [name] [name] on 02/20/2015 13:56:01 [place], [name] ([id number]) -------------------------------------------------------------------------------- Lower Extremity Assessment Details Patient Name: Saur, [name] Date of Service: 02/20/2015 1:30 PM Medical Record Number: [id number] Patient Account Number: [id number] Date of Birth/Sex: 08-04-1926 (82 y.o. Female) Treating RN: [name] [name] Primary Care Physician: [name] [name] Other Clinician: Referring Physician: Lelon [name] [name] Treating Physician/Extender: [name] [name] in Treatment: 6 Edema Assessment Assessed: [Left: No] [Right: No] Edema: [Left: N] [Right: o] Vascular Assessment Pulses: Posterior Tibial Dorsalis Pedis Palpable: [Right:Yes] Extremity colors, hair growth, and conditions: Extremity Color: [Right:Normal] Hair Growth on Extremity: [Right:No] Temperature of Extremity: [Right:Cool] Capillary Refill: [Right:> 3 seconds] Dependent Rubor: [Right:No] Blanched when Elevated: [Right:No] Lipodermatosclerosis: [Right:No] Toe Nail Assessment Left: Right: Thick: No Discolored: No Deformed: No Improper Length and Hygiene: No Electronic Signature(s) Signed: 02/20/2015 4:12:15 PM By: [name] Eaton, RN, [name] [name] Entered By: [name] Eaton RN, [name] [name] on 02/20/2015 13:34:23 [place], [name]  ([id number]) -------------------------------------------------------------------------------- Multi Wound Chart Details Patient Name: Klett, [name] Date of Service: 02/20/2015 1:30 PM Medical Record Number: [id number] Patient Account Number: [id number] Date of Birth/Sex: 09/16/1926 (82 y.o. Female) Treating RN: [name] [name] Primary Care Physician: [name] [name] Other Clinician: Referring Physician: Lelon [name] [name] Treating Physician/Extender: [name] [name] in Treatment: 6 Vital Signs Height(in): 58 Pulse(bpm): 92 Weight(lbs): 155 Blood Pressure 153/49 (mmHg): Body Mass Index(BMI): 32 Temperature(F): 97.7 Respiratory Rate 16 (breaths/min): Photos: [1:No Photos] [N/A:N/A] Wound Location: [1:Right Toe Great] [N/A:N/A] Wounding Event: [1:Gradually Appeared] [N/A:N/A] Primary Etiology: [1:Trauma,  Other] [N/A:N/A] Comorbid History: [1:Cataracts, Hypertension, Gout, Osteoarthritis] [N/A:N/A] Date Acquired: [1:11/20/2014] [N/A:N/A] Weeks of Treatment: [1:6] [N/A:N/A] Wound Status: [1:Open] [N/A:N/A] Measurements L x W x [name] [name] 0.7x0.2x0.1 [N/A:N/A] (cm) Area (cm) : [1:0.11] [N/A:N/A] Volume (cm) : [1:0.011] [N/A:N/A] % Reduction in Area: [1:97.80%] [N/A:N/A] % Reduction in Volume: 98.90% [N/A:N/A] Classification: [1:Full Thickness Without Exposed Support Structures] [N/A:N/A] Exudate Amount: [1:None Present] [N/A:N/A] Wound Margin: [1:Distinct, outline attached] [N/A:N/A] Granulation Amount: [1:Large (67-100%)] [N/A:N/A] Granulation Quality: [1:Pink] [N/A:N/A] Necrotic Amount: [1:None Present (0%)] [N/A:N/A] Exposed Structures: [1:Fascia: No Fat: No Tendon: No Muscle: No Joint: No Bone: No] [N/A:N/A] Limited to Skin Breakdown Epithelialization: Small (1-33%) N/A N/A Periwound Skin Texture: Callus: Yes N/A N/A Edema: No Excoriation: No Induration: No Crepitus: No Fluctuance: No Friable: No Rash: No Scarring: No Periwound Skin Dry/Scaly: Yes N/A  N/A Moisture: Maceration: No Moist: No Periwound Skin Color: Atrophie Blanche: No N/A N/A Cyanosis: No Ecchymosis: No Erythema: No Hemosiderin Staining: No Mottled: No Pallor: No Rubor: No Temperature: No Abnormality N/A N/A Tenderness on No N/A N/A Palpation: Wound Preparation: Ulcer Cleansing: N/A N/A Rinsed/Irrigated with Saline Topical Anesthetic Applied: Other: lidocaine 4% Treatment Notes Electronic Signature(s) Signed: 02/20/2015 4:12:15 PM By: [name] Eaton, RN, [name] [name] Entered By: [name] Eaton RN, [name] [name] on 02/20/2015 13:45:58 Edmonds, [name] ([id number]) -------------------------------------------------------------------------------- [place] Details Patient Name: Salim, [name] Date of Service: 02/20/2015 1:30 PM Medical Record Number: [id number] Patient Account Number: [id number] Date of Birth/Sex: [birth year] (82 y.o. Female) Treating RN: [name] [name] Primary Care Physician: [name] [name] Other Clinician: Referring Physician: Lelon [name] [name] Treating Physician/Extender: [name] [name] in Treatment: 6 Active Inactive Electronic Signature(s) Signed: 02/20/2015 4:12:15 PM By: Ardean Larsen Signed: 02/20/2015 5:35:09 PM By: Gretta Cool RN, BSN, Kim RN, BSN Entered By: Gretta Cool, RN, BSN, Kim on 02/20/2015 14:15:27 Buczkowski, [name] ([id number]) -------------------------------------------------------------------------------- Pain Assessment Details Patient Name: Peery, [name] Date of Service: 02/20/2015 1:30 PM Medical Record Number: [id number] Patient Account Number: [id number] Date of Birth/Sex: 1926/05/14 (82 y.o. Female) Treating RN: [name] [name] Primary Care Physician: [name] [name] Other Clinician: Referring Physician: Lelon [name] [name] Treating Physician/Extender: [name] [name] in Treatment: 6 Active Problems Location of Pain Severity and Description of Pain Patient Has Paino No Site Locations Pain Management and  Medication Current Pain Management: Electronic Signature(s) Signed: 02/20/2015 4:12:15 PM By: [name] Eaton RN, [name] [name] Entered By: [name] Eaton RN, [name] [name] on 02/20/2015 13:29:36 [place], [name] ([id number]) -------------------------------------------------------------------------------- Patient/Caregiver Education Details Patient Name: Drumgoole, [name] Date of Service: 02/20/2015 1:30 PM Medical Record Number: [id number] Patient Account Number: [id number] Date of Birth/Gender: 11-22-1926 (82 y.o. Female) Treating RN: [name] [name] Primary Care Physician: [name] [name] Other Clinician: Referring Physician: Lelon [name] [name] Treating Physician/Extender: [name] [name] in Treatment: 6 Education Assessment Education Provided To: Patient and Caregiver daughter Education Topics Provided Wound/Skin Impairment: Handouts: Caring for Your Ulcer, Skin Care Do's and Dont's Methods: Explain/Verbal Responses: State content correctly Electronic Signature(s) Signed: 02/20/2015 4:12:15 PM By: [name] Eaton, RN, [name] [name] Entered By: [name] Eaton RN, [name] [name] on 02/20/2015 13:36:44 Ocasio, [name] ([id number]) -------------------------------------------------------------------------------- Wound Assessment Details Patient Name: Bolding, Sumiye L. Date of Service: 02/20/2015 1:30 PM Medical Record Number: [id number] Patient Account Number: [id number] Date of Birth/Sex: [birth year] (82 y.o. Female) Treating RN: [name] [name] Primary Care Physician: [name] [name] Other Clinician: Referring Physician: Lelon [name] [name] Treating Physician/Extender: [name] [name] in Treatment: 6 Wound Status Wound Number: 1 Primary Trauma, Other Etiology: Wound Location: Right Toe Great Wound Status: Healed - Epithelialized Wounding Event: Gradually Appeared Comorbid Cataracts, Hypertension, Gout, Date Acquired: 11/20/2014 History: Osteoarthritis Weeks Of  Treatment: 6 Clustered Wound: No Photos Photo Uploaded By:  Gretta Cool, RN, BSN, Kim on 02/20/2015 16:44:22 Wound Measurements Length: (cm) 0 Width: (cm) 0 Depth: (cm) 0 Area: (cm) 0 Volume: (cm) 0 % Reduction in Area: 100% % Reduction in Volume: 100% Epithelialization: Small (1-33%) Tunneling: No Undermining: No Wound Description Full Thickness Without Exposed Foul Odor Af Classification: Support Structures Wound Margin: Distinct, outline attached Exudate None Present Amount: ter Cleansing: No Wound Bed Granulation Amount: Large (67-100%) Exposed Structure Granulation Quality: Pink Fascia Exposed: No Necrotic Amount: None Present (0%) Fat Layer Exposed: No Tendon Exposed: No Muscle Exposed: No Brege, [name] ([id number]) Joint Exposed: No Bone Exposed: No Limited to Skin Breakdown Periwound Skin Texture Texture Color No Abnormalities Noted: No No Abnormalities Noted: No Callus: Yes Atrophie Blanche: No Crepitus: No Cyanosis: No Excoriation: No Ecchymosis: No Fluctuance: No Erythema: No Friable: No Hemosiderin Staining: No Induration: No Mottled: No Localized Edema: No Pallor: No Rash: No Rubor: No Scarring: No Temperature / Pain Moisture Temperature: No Abnormality No Abnormalities Noted: No Dry / Scaly: Yes Maceration: No Moist: No Wound Preparation Ulcer Cleansing: Rinsed/Irrigated with Saline Topical Anesthetic Applied: Other: lidocaine 4%, Electronic Signature(s) Signed: 02/20/2015 4:12:15 PM By: [name] Eaton, RN, [name] [name] Entered By: [name] Eaton RN, [name] [name] on 02/20/2015 13:50:54 Stanek, Dymphna L. ([id number]) -------------------------------------------------------------------------------- Vitals Details Patient Name: Ivan, Faylinn L. Date of Service: 02/20/2015 1:30 PM Medical Record Number: [id number] Patient Account Number: [id number] Date of Birth/Sex: [birth year] (82 y.o. Female) Treating RN: [name] [name] Primary Care Physician: [name] [name] Other Clinician: Referring Physician: Lelon [name] [name] Treating Physician/Extender: [name] [name] in Treatment: 6 Vital Signs Time Taken: 13:29 Temperature (F): 97.7 Height (in): 58 Pulse (bpm): 92 Weight (lbs): 155 Respiratory Rate (breaths/min): 16 Body Mass Index (BMI): 32.4 Blood Pressure (mmHg): 153/49 Reference Range: 80 - 120 mg / dl Pulse Oximetry (%): 92 Electronic Signature(s) Signed: 02/20/2015 4:12:15 PM By: [name] Eaton RN, [name] [name] Entered By: [name] Eaton RN, [name] [name] on 02/20/2015 13:31:55

## 2015-02-21 NOTE — Progress Notes (Signed)
Czajkowski, Margia L. ([id number]) Visit Report for 02/20/2015 Chief Complaint Document Details Patient Name: [name] [name], [name] Date of Service: 02/20/2015 1:30 PM Medical Record Number: [id number] Patient Account Number: [id number] Date of Birth/Sex: May 14, 1926 (82 y.o. Female) Treating RN: Macarthur Critchley Primary Care Physician: [name] Other Clinician: Referring Physician: [name] Treating Physician/Extender: Frann Rider in Treatment: 6 Information Obtained from: Patient Chief Complaint Patient presents to the wound care center for a consult due non healing wound. She has had a nonhealing wound on the right great toe since July of this year Electronic Signature(s) Signed: 02/20/2015 1:54:47 PM By: Christin Fudge MD, FACS Entered By: Christin Fudge on 02/20/2015 13:54:47 [name] [name], [name] ([id number]) -------------------------------------------------------------------------------- HPI Details Patient Name: Troutman, Sahej L. Date of Service: 02/20/2015 1:30 PM Medical Record Number: [id number] Patient Account Number: [id number] Date of Birth/Sex: 1927-02-07 (82 y.o. Female) Treating RN: Macarthur Critchley Primary Care Physician: [name] Other Clinician: Referring Physician: [name] Treating Physician/Extender: Frann Rider in Treatment: 6 History of Present Illness Location: ulcer on the right big toe Quality: Patient reports experiencing a dull pain to affected area(s). Severity: Patient states wound are getting worse. Duration: Patient has had the wound for > 3 months prior to seeking treatment at the wound center Timing: Pain in wound is Intermittent (comes and goes Context: The wound appeared gradually over time Modifying Factors: Other treatment(s) tried include: has been treated with doxycycline and prior to that Keflex Associated Signs and Symptoms: Patient reports having difficulty standing for long periods. HPI Description: 82 year old  patient was recently seen by the PCPs office for significant pain right great toe which has been going on since July. She was initially treated with Keflex which she did not complete and after the office visit this time she has been put on doxycycline. at the time of her visit she was found to have a ulcer on the plantar surface of the right great toe and also had a pyogenic granuloma over this area. X-ray of the right foot done 12/29/2014 -- IMPRESSION:Soft-tissue swelling and ulceration right great toe. No underlying bony lytic lesion identified. If osteomyelitis remains of clinical concern MRI can be obtained. Past medical history significant for anemia, chronic kidney disease stage III, obesity, varicose veins, coronary artery disease, gout, history of nicotine addiction given up smoking in 2002, hypertension, status post cardiac catheterization, status post abdominal hysterectomy, cholecystectomy and tonsillectomy. hemoglobin A1c done in August was 5.8 01/22/2015 -- at this stage the [facility] Walking boat was going to cost them significant amount of money and they want to defer using that at the present time. 01/29/2015 -- she had a podiatry appointment and they have trimmed her toenails. She has not heard back from the vascular office regarding her venous duplex study and I have asked them to call personally so that they can get the appointment soon. 02/06/2015 -- they have made contact with the vascular office and from what I understand that test has been done but the report is pending. 02/19/2014 -- the vascular test is scheduled for tomorrow Electronic Signature(s) Signed: 02/20/2015 1:55:15 PM By: Christin Fudge MD, FACS Entered By: Christin Fudge on 02/20/2015 13:55:14 [name] [name], [name] ([id number]) -------------------------------------------------------------------------------- Physical Exam Details Patient Name: Brendel, Karysa L. Date of Service: 02/20/2015 1:30 PM Medical Record Number:  [id number] Patient Account Number: [id number] Date of Birth/Sex: [birth year] (82 y.o. Female) Treating RN: Macarthur Critchley Primary Care Physician: [name] Other Clinician: Referring Physician: [name] Treating Physician/Extender: Frann Rider in Treatment: 6  Constitutional . Pulse regular. Respirations normal and unlabored. Afebrile. . Eyes Nonicteric. Reactive to light. Ears, Nose, Mouth, and Throat Lips, teeth, and gums WNL.Marland Kitchen Moist mucosa without lesions. Neck supple and nontender. No palpable supraclavicular or cervical adenopathy. Normal sized without goiter. Respiratory WNL. No retractions.. Cardiovascular Pedal Pulses WNL. No clubbing, cyanosis or edema. Lymphatic No adneopathy. No adenopathy. No adenopathy. Musculoskeletal Adexa without tenderness or enlargement.. Digits and nails w/o clubbing, cyanosis, infection, petechiae, ischemia, or inflammatory conditions.. Integumentary (Hair, Skin) No suspicious lesions. No crepitus or fluctuance. No peri-wound warmth or erythema. No masses.Marland Kitchen Psychiatric Judgement and insight Intact.. No evidence of depression, anxiety, or agitation.. Notes the wound is looking excellent but there is a lot of callus surrounding it and I have sharply debrided this with a curet and found that there is no open ulceration and the wound is completely healed. Electronic Signature(s) Signed: 02/20/2015 1:56:01 PM By: Christin Fudge MD, FACS Entered By: Christin Fudge on 02/20/2015 13:56:01 [name] [name], [name] ([id number]) -------------------------------------------------------------------------------- Physician Orders Details Patient Name: [name] [name], [name] Date of Service: 02/20/2015 1:30 PM Medical Record Number: [id number] Patient Account Number: [id number] Date of Birth/Sex: 09-16-1926 (82 y.o. Female) Treating RN: Macarthur Critchley Primary Care Physician: [name] Other Clinician: Referring Physician: [name] Treating  Physician/Extender: Frann Rider in Treatment: 6 Verbal / Phone Orders: Yes Clinician: Macarthur Critchley Read Back and Verified: Yes Diagnosis Coding Off-Loading o Open toe surgical shoe with peg assist. - for additional week and then switch over to well-fitting shoes Discharge From [place] o Discharge from [place] - treatment complete Electronic Signature(s) Signed: 02/20/2015 4:12:15 PM By: Ardean Larsen Signed: 02/20/2015 4:44:22 PM By: Christin Fudge MD, FACS Entered By: Rebecca Eaton RN, [place] on 02/20/2015 13:52:07 [name] [name], [name] ([id number]) -------------------------------------------------------------------------------- Problem List Details Patient Name: [name] [name], [name] Date of Service: 02/20/2015 1:30 PM Medical Record Number: [id number] Patient Account Number: [id number] Date of Birth/Sex: 10-04-1926 (82 y.o. 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 ([id number]) -------------------------------------------------------------------------------- Progress Note Details Patient Name: [name] [name], [name] Date of Service: 02/20/2015 1:30 PM Medical Record Number: [id number] Patient Account Number: [id number] Date of Birth/Sex: 1926/11/01 (82 y.o. Female) Treating RN: Macarthur Critchley Primary Care Physician: [name] Other Clinician: Referring Physician: [name] Treating Physician/Extender: Frann Rider in Treatment: 6 Subjective Chief Complaint Information obtained from Patient Patient presents to the wound care center for a consult due non healing wound. She has had a nonhealing wound on the right great toe since July of this year History of Present Illness (HPI) The following HPI elements were documented for the patient's wound: Location: ulcer on the right big toe Quality: Patient reports experiencing a dull pain to affected area(s). Severity: Patient states wound are getting worse. Duration: Patient has had the wound for > 3 months prior to seeking treatment at the wound center Timing: Pain in wound is Intermittent (comes and goes Context: The wound appeared gradually over time Modifying Factors: Other treatment(s) tried include: has been treated with  doxycycline and prior to that Keflex Associated Signs and Symptoms: Patient reports having difficulty standing for long periods. 82 year old patient was recently seen by the PCPs office for significant pain right great toe which has been going on since July. She was initially treated with Keflex which she did not complete and after the office visit this time she has been put on doxycycline. at the time of her visit she was found to have a ulcer on the plantar surface of the right great toe and also had a pyogenic granuloma over this area. X-ray of the right foot done 12/29/2014 -- IMPRESSION:Soft-tissue swelling and ulceration right great toe. No underlying bony lytic lesion identified. If osteomyelitis remains of clinical concern MRI can be obtained. Past medical history significant for anemia, chronic kidney disease stage III, obesity, varicose veins, coronary artery disease, gout, history of nicotine addiction given up smoking in 2002, hypertension, status post cardiac  catheterization, status post abdominal hysterectomy, cholecystectomy and tonsillectomy. hemoglobin A1c done in August was 5.8 01/22/2015 -- at this stage the [facility] Walking boat was going to cost them significant amount of money and they want to defer using that at the present time. 01/29/2015 -- she had a podiatry appointment and they have trimmed her toenails. She has not heard back from the vascular office regarding her venous duplex study and I have asked them to call personally so that they can get the appointment soon. 02/06/2015 -- they have made contact with the vascular office and from what I understand that test has been done but the report is pending. 02/19/2014 -- the vascular test is scheduled for tomorrow [name] [name], [name] ([id number]) Objective Constitutional Pulse regular. Respirations normal and unlabored. Afebrile. Vitals Time Taken: 1:29 PM, Height: 58 in, Weight: 155 lbs, BMI: 32.4, Temperature: 97.7 F, Pulse: 92 bpm, Respiratory Rate: 16 breaths/min, Blood Pressure: 153/49 mmHg, Pulse Oximetry: 92 %. Eyes Nonicteric. Reactive to light. Ears, Nose, Mouth, and Throat Lips, teeth, and gums WNL.Marland Kitchen Moist mucosa without lesions. Neck supple and nontender. No palpable supraclavicular or cervical adenopathy. Normal sized without goiter. Respiratory WNL. No retractions.. Cardiovascular Pedal Pulses WNL. No clubbing, cyanosis or edema. Lymphatic No adneopathy. No adenopathy. No adenopathy. Musculoskeletal Adexa without tenderness or enlargement.. Digits and nails w/o clubbing, cyanosis, infection, petechiae, ischemia, or inflammatory conditions.Marland Kitchen Psychiatric Judgement and insight Intact.. No evidence of depression, anxiety, or agitation.. General Notes: the wound is looking excellent but there is a lot of callus surrounding it and I have sharply debrided this with a curet and found that there is no open ulceration and the wound is completely healed. Integumentary (Hair,  Skin) No suspicious lesions. No crepitus or fluctuance. No peri-wound warmth or erythema. No masses.. Wound #1 status is Healed - Epithelialized. Original cause of wound was Gradually Appeared. The wound is located on the Right Toe Great. The wound measures 0cm length x 0cm width x 0cm depth; 0cm^2 area and 0cm^3 volume. The wound is limited to skin breakdown. There is no tunneling or undermining noted. [name] [name], [name] ([id number]) There is a none present amount of drainage noted. The wound margin is distinct with the outline attached to the wound base. There is large (67-100%) pink granulation within the wound bed. There is no necrotic tissue within the wound bed. The periwound skin appearance exhibited: Callus, Dry/Scaly. The periwound skin appearance did not exhibit: Crepitus, Excoriation, Fluctuance, Friable, Induration, Localized Edema, Rash, Scarring, Maceration, Moist, Atrophie Blanche, Cyanosis, Ecchymosis, Hemosiderin Staining, Mottled, Pallor, Rubor, Erythema. Periwound temperature was noted  as No Abnormality. 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 I have discharged her from the wound care services and I have asked them to come back and see as on an as- needed basis. She would also give [place] her vascular report and follow-up with the vascular surgeon as needed. Plan Off-Loading: Open toe surgical shoe with peg assist. - for additional week and then switch over to well-fitting shoes Discharge From [facility] Services: Discharge from [place] - treatment complete After carefully removing all the callus there is no open wound under and she is completely healed. I  have discharged her from the wound care services and I have asked them to come back and see as on an as- needed basis. She would also give [place] her vascular report and follow-up with the vascular surgeon as needed. [name] [name], [name] ([id number]) Electronic Signature(s) Signed: 02/20/2015 1:56:58 PM By: Christin Fudge MD, FACS Entered By: Christin Fudge on 02/20/2015 13:56:58 [name] [name], [name] ([id number]) -------------------------------------------------------------------------------- SuperBill Details Patient Name: Fauver, Cortlyn L. Date of Service: 02/20/2015 Medical Record Number: [id number] Patient Account Number: [id number] Date of Birth/Sex: 11-21-1926 (82 y.o. 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 I87.301 Chronic venous hypertension (idiopathic) without complications of right lower extremity Facility Procedures CPT4 Code: [id number] Description: [phone] - WOUND CARE VISIT-LEV 2 EST PT Modifier: Quantity: 1 Physician Procedures CPT4: Description Modifier Quantity Code [id number] - WC PHYS LEVEL 3 - EST PT 1 ICD-10 Description Diagnosis L97.512 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

## 2015-04-24 ENCOUNTER — Ambulatory Visit: Payer: Medicare PPO | Admitting: Podiatry

## 2015-04-25 ENCOUNTER — Ambulatory Visit: Payer: Medicare PPO | Admitting: Family Medicine

## 2015-05-01 ENCOUNTER — Ambulatory Visit (INDEPENDENT_AMBULATORY_CARE_PROVIDER_SITE_OTHER): Payer: PPO | Admitting: Podiatry

## 2015-05-01 ENCOUNTER — Encounter: Payer: Self-pay | Admitting: Podiatry

## 2015-05-01 VITALS — BP 176/90 | HR 76 | Resp 18

## 2015-05-01 DIAGNOSIS — M79676 Pain in unspecified toe(s): Secondary | ICD-10-CM | POA: Diagnosis not present

## 2015-05-01 DIAGNOSIS — B351 Tinea unguium: Secondary | ICD-10-CM | POA: Diagnosis not present

## 2015-05-01 NOTE — Progress Notes (Signed)
Patient ID: [name] [name], female   DOB: [birth year], 82 y.o.   MRN: [id number]  Subjective: 82 y.o. returns the office today for painful, elongated, thickened toenails which she cannot trim herself. Denies any redness or drainage around the nails. States the wound on her right big toe healed and was discharged from wound care. Denies any acute changes since last appointment and no new complaints today. Denies any systemic complaints such as fevers, chills, nausea, vomiting.   Objective: AAO 3, NAD DP/PT pulses palpable, CRT less than 3 seconds Protective sensation decreased with Simms Weinstein monofilament Nails hypertrophic, dystrophic, elongated, brittle, discolored 10. There is tenderness overlying the nails 1-5 bilaterally. There is no surrounding erythema or drainage along the nail sites. No open lesions or pre-ulcerative lesions are identified. Wound is healed and good skin is overlying the site of the previous wound.  No other areas of tenderness bilateral lower extremities. No overlying edema, erythema, increased warmth. No pain with calf compression, swelling, warmth, erythema.  Assessment: Patient presents with symptomatic onychomycosis  Plan: -Treatment options including alternatives, risks, complications were discussed -Nails sharply debrided 10 without complication/bleeding. -Discussed daily foot inspection. If there are any changes, to call the office immediately.  -Follow-up in 3 months or sooner if any problems are to arise. In the meantime, encouraged to call the office with any questions, concerns, changes symptoms.  Celesta Gentile, DPM

## 2015-05-02 ENCOUNTER — Encounter: Payer: Self-pay | Admitting: Family Medicine

## 2015-05-02 ENCOUNTER — Ambulatory Visit (INDEPENDENT_AMBULATORY_CARE_PROVIDER_SITE_OTHER): Payer: PPO | Admitting: Family Medicine

## 2015-05-02 VITALS — BP 160/92 | HR 67 | Temp 98.2°F | Resp 18 | Wt 160.0 lb

## 2015-05-02 DIAGNOSIS — R7303 Prediabetes: Secondary | ICD-10-CM

## 2015-05-02 DIAGNOSIS — I10 Essential (primary) hypertension: Secondary | ICD-10-CM

## 2015-05-02 LAB — POCT GLYCOSYLATED HEMOGLOBIN (HGB A1C)
Est. average glucose Bld gHb Est-mCnc: 114
HEMOGLOBIN A1C: 5.6

## 2015-05-02 MED ORDER — AMLODIPINE BESYLATE 2.5 MG PO TABS: 2.5000 mg | ORAL_TABLET | Freq: Every day | ORAL | Status: DC

## 2015-05-02 NOTE — Progress Notes (Signed)
Patient: [name] [name] Female    DOB: [birth year]   82 y.o.   MRN: [id number] Visit Date: 05/02/2015  Today's Provider: [name], MD   Chief Complaint  Patient presents with  . Hypertension    follow up  . Hypothyroidism    follow up  . Hyperglycemia    follow up   Subjective:    HPI   Hypertension, follow-up:  BP Readings from Last 3 Encounters:  05/01/15 176/90  01/23/15 157/74  12/29/14 168/72    She was last seen for hypertension 6 months ago.  BP at that visit was 160/70. Management since that visit includes no changes. She reports good compliance with treatment. She is not having side effects.  She is exercising. She is adherent to low salt diet.   Outside blood pressures are not being checked. She is experiencing none.  Patient denies chest pain, chest pressure/discomfort, claudication, dyspnea, exertional chest pressure/discomfort, fatigue, irregular heart beat, lower extremity edema, near-syncope, orthopnea, palpitations, paroxysmal nocturnal dyspnea, syncope and tachypnea.   Cardiovascular risk factors include advanced age (older than 69 for men, 63 for women).  Use of agents associated with hypertension: NSAIDS.     Weight trend: stable Wt Readings from Last 3 Encounters:  12/29/14 158 lb (71.668 kg)  10/09/14 157 lb (71.215 kg)  09/04/14 160 lb (72.576 kg)    Current diet: well balanced  ------------------------------------------------------------------------   Prediabetes, Follow-up:   Lab Results  Component Value Date   HGBA1C 5.8 10/09/2014   HGBA1C 5.8 10/10/2013   GLUCOSE 117* 10/09/2014   GLUCOSE 134* 12/03/2006    Last seen for for this 6 months ago.  Management since that visit includes no changes. Current symptoms include none and have been stable.  Weight trend: stable Prior visit with dietician: no Current diet: well balanced Current exercise: stationary bike  Pertinent Labs:    Component Value Date/Time     CHOL 162 04/10/2014   TRIG 134 04/10/2014   CREATININE 1.37* 10/09/2014 0918   CREATININE 1.3* 04/10/2014    Wt Readings from Last 3 Encounters:  12/29/14 158 lb (71.668 kg)  10/09/14 157 lb (71.215 kg)  09/04/14 160 lb (72.576 kg)     Follow up Hypothyroidism:  Last office visit was 6 months ago and no changes were made.  Patient reports good compliance with treatment. Lab Results  Component Value Date   TSH 1.850 10/09/2014      Allergies  Allergen Reactions  . Atorvastatin     myalgias/arthralgias.  . Lovastatin     Other reaction(s): Muscle Pain   Previous Medications   ALLOPURINOL (ZYLOPRIM) 100 MG TABLET    TAKE TWO TABLETS BY MOUTH ONCE DAILY FOR GOUT PREVENTION   ASPIRIN 81 MG TABLET    Take 81 mg by mouth daily.    CALCIUM CARBONATE-VITAMIN D PO    Take 2 tablets by mouth daily.    GARLIC PO    Take 5 mg by mouth daily.   IBUPROFEN (ADVIL,MOTRIN) 600 MG TABLET    Take by mouth.   KRILL OIL 300 MG CAPS    Take 300 mg by mouth daily.   LEVOTHYROXINE (SYNTHROID, LEVOTHROID) 75 MCG TABLET    Take 75 mcg by mouth daily.    MULTIPLE MINERALS-VITAMINS PO    Take by mouth daily.    MULTIPLE VITAMINS-MINERALS (PRESERVISION AREDS 2 PO)    Take 2 tablets by mouth daily.   SIMVASTATIN (ZOCOR) 10 MG TABLET  Take 10 mg by mouth daily.    VALSARTAN-HYDROCHLOROTHIAZIDE (DIOVAN-HCT) 320-25 MG PER TABLET    Take 1 tablet by mouth daily.     Review of Systems  Constitutional: Negative for fever, chills, appetite change and fatigue.  Respiratory: Negative for chest tightness and shortness of breath.   Cardiovascular: Negative for chest pain and palpitations.  Gastrointestinal: Negative for nausea, vomiting and abdominal pain.  Endocrine: Negative for cold intolerance, heat intolerance, polydipsia, polyphagia and polyuria.  Neurological: Negative for dizziness and weakness.    Social History  Substance Use Topics  . Smoking status: Former Smoker    Quit date: 02/18/2000   . Smokeless tobacco: Never Used  . Alcohol Use: No   Objective:   BP 160/92 mmHg  Pulse 67  Temp(Src) 98.2 F (36.8 C) (Oral)  Resp 18  Wt 160 lb (72.576 kg)  SpO2 95%  Physical Exam    General Appearance:    Alert, cooperative, no distress  Eyes:    PERRL, conjunctiva/corneas clear, EOM's intact       Lungs:     Clear to auscultation bilaterally, respirations unlabored  Heart:    Regular rate and rhythm  Neurologic:   Awake, alert, oriented x 3. No apparent focal neurological           defect.         Results for orders placed or performed in visit on 05/02/15  POCT HgB A1C  Result Value Ref Range   Hemoglobin A1C 5.6    Est. average glucose Bld gHb Est-mCnc 114        Assessment & Plan:     1. Prediabetes Stable. Check a1c twice yearly - POCT HgB A1C  2. Essential hypertension Add low dose amlodipine. Follow up BP check 3 months.  - amLODipine (NORVASC) 2.5 MG tablet; Take 1 tablet (2.5 mg total) by mouth daily.  Dispense: 90 tablet; Refill: 0       [name], MD  [facility]

## 2015-05-07 DIAGNOSIS — H353211 Exudative age-related macular degeneration, right eye, with active choroidal neovascularization: Secondary | ICD-10-CM | POA: Diagnosis not present

## 2015-05-24 ENCOUNTER — Other Ambulatory Visit: Payer: Self-pay | Admitting: Family Medicine

## 2015-06-28 ENCOUNTER — Other Ambulatory Visit: Payer: Self-pay | Admitting: Family Medicine

## 2015-07-02 ENCOUNTER — Other Ambulatory Visit: Payer: Self-pay | Admitting: Family Medicine

## 2015-07-11 ENCOUNTER — Other Ambulatory Visit: Payer: Self-pay | Admitting: Family Medicine

## 2015-07-25 ENCOUNTER — Encounter: Payer: Self-pay | Admitting: Family Medicine

## 2015-07-25 ENCOUNTER — Ambulatory Visit (INDEPENDENT_AMBULATORY_CARE_PROVIDER_SITE_OTHER): Payer: PPO | Admitting: Family Medicine

## 2015-07-25 VITALS — BP 142/80 | HR 66 | Temp 97.5°F | Resp 16 | Wt 162.0 lb

## 2015-07-25 DIAGNOSIS — I2510 Atherosclerotic heart disease of native coronary artery without angina pectoris: Secondary | ICD-10-CM

## 2015-07-25 DIAGNOSIS — E039 Hypothyroidism, unspecified: Secondary | ICD-10-CM | POA: Diagnosis not present

## 2015-07-25 DIAGNOSIS — I10 Essential (primary) hypertension: Secondary | ICD-10-CM | POA: Diagnosis not present

## 2015-07-25 DIAGNOSIS — E669 Obesity, unspecified: Secondary | ICD-10-CM | POA: Diagnosis not present

## 2015-07-25 DIAGNOSIS — N183 Chronic kidney disease, stage 3 unspecified: Secondary | ICD-10-CM

## 2015-07-25 MED ORDER — AMLODIPINE BESYLATE 2.5 MG PO TABS: 2.5000 mg | ORAL_TABLET | Freq: Every day | ORAL | Status: DC

## 2015-07-25 NOTE — Progress Notes (Signed)
Patient: [name] [name] Female    DOB: 02/24/1926   82 y.o.   MRN: [id number] Visit Date: 07/25/2015  Today's Provider: [name], MD   Chief Complaint  Patient presents with  . Follow-up  . Hypertension  . Hypothyroidism   Subjective:    HPI   Follow-up for hypothyroidism from 10 months ago; no changes. Lab Results  Component Value Date   TSH 1.850 10/09/2014       Hypertension, follow-up:  BP Readings from Last 3 Encounters:  07/25/15 142/80  05/02/15 160/92  05/01/15 176/90    She was last seen for hypertension 3 months ago.  BP at that visit was 160/92. Management since that visit includes; added low dose amlodipine 2.5 mg qd.She reports good compliance with treatment. She is not having side effects. none  She is exercising. She is adherent to low salt diet.   Outside blood pressures are 145/80. She is experiencing none.  Patient denies none.   Cardiovascular risk factors include prediabetes.  Use of agents associated with hypertension: none.   ----------------------------------------------------------------------     Allergies  Allergen Reactions  . Atorvastatin     myalgias/arthralgias.  . Lovastatin     Other reaction(s): Muscle Pain   Current Meds  Medication Sig  . allopurinol (ZYLOPRIM) 100 MG tablet TAKE TWO TABLETS EVERY DAY  . amLODipine (NORVASC) 2.5 MG tablet Take 1 tablet (2.5 mg total) by mouth daily.  Marland Kitchen aspirin 81 MG tablet Take 81 mg by mouth daily.   Marland Kitchen CALCIUM CARBONATE-VITAMIN D PO Take 2 tablets by mouth daily.   Marland Kitchen GARLIC PO Take 5 mg by mouth daily.  Marland Kitchen ibuprofen (ADVIL,MOTRIN) 600 MG tablet Take by mouth.  Javier Docker Oil 300 MG CAPS Take 300 mg by mouth daily.  Marland Kitchen levothyroxine (SYNTHROID, LEVOTHROID) 75 MCG tablet TAKE ONE TABLET EVERY DAY  . MULTIPLE MINERALS-VITAMINS PO Take by mouth daily.   . Multiple Vitamins-Minerals (PRESERVISION AREDS 2 PO) Take 2 tablets by mouth daily.  . simvastatin (ZOCOR) 10 MG tablet  TAKE ONE TABLET EVERY EVENING  . valsartan-hydrochlorothiazide (DIOVAN-HCT) 320-25 MG tablet TAKE ONE TABLET EVERY DAY    Review of Systems  Constitutional: Negative for fever, chills, appetite change and fatigue.  Respiratory: Negative for chest tightness and shortness of breath.   Cardiovascular: Negative for chest pain and palpitations.  Gastrointestinal: Negative for nausea, vomiting and abdominal pain.  Neurological: Negative for dizziness and weakness.    Social History  Substance Use Topics  . Smoking status: Former Smoker    Quit date: 02/18/2000  . Smokeless tobacco: Never Used  . Alcohol Use: No   Objective:   BP 142/80 mmHg  Pulse 66  Temp(Src) 97.5 F (36.4 C) (Oral)  Resp 16  Wt 162 lb (73.483 kg)  Physical Exam   General Appearance:    Alert, cooperative, no distress  Eyes:    PERRL, conjunctiva/corneas clear, EOM's intact       Lungs:     Clear to auscultation bilaterally, respirations unlabored  Heart:    Regular rate and rhythm  Neurologic:   Awake, alert, oriented x 3. No apparent focal neurological           defect.           Assessment & Plan:     1. Essential hypertension Improved. Continue current medications.   - amLODipine (NORVASC) 2.5 MG tablet; Take 1 tablet (2.5 mg total) by mouth daily.  Dispense: 90  tablet; Refill: 3 - Renal function panel  2. Obesity Diet and exercise  3. CAD in native artery Asymptomatic. Compliant with medication.  Continue aggressive risk factor modification.  - Lipid panel  4. Chronic kidney disease (CKD), stage III (moderate)  - Renal function panel  5. Hypothyroidism, unspecified hypothyroidism type  - TSH      The entirety of the information documented in the History of Present Illness, Review of Systems and Physical Exam were personally obtained by me. Portions of this information were initially documented by April M. Sabra Heck, CMA and reviewed by me for thoroughness and accuracy.   [name],  MD  [facility]

## 2015-07-26 DIAGNOSIS — I10 Essential (primary) hypertension: Secondary | ICD-10-CM | POA: Diagnosis not present

## 2015-07-26 DIAGNOSIS — N183 Chronic kidney disease, stage 3 (moderate): Secondary | ICD-10-CM | POA: Diagnosis not present

## 2015-07-26 DIAGNOSIS — E039 Hypothyroidism, unspecified: Secondary | ICD-10-CM | POA: Diagnosis not present

## 2015-07-26 DIAGNOSIS — I2510 Atherosclerotic heart disease of native coronary artery without angina pectoris: Secondary | ICD-10-CM | POA: Diagnosis not present

## 2015-07-27 LAB — RENAL FUNCTION PANEL
ALBUMIN: 4.3 g/dL (ref 3.5–4.7)
BUN/Creatinine Ratio: 30 — ABNORMAL HIGH (ref 12–28)
BUN: 34 mg/dL — ABNORMAL HIGH (ref 8–27)
CALCIUM: 9.8 mg/dL (ref 8.7–10.3)
CHLORIDE: 99 mmol/L (ref 96–106)
CO2: 25 mmol/L (ref 18–29)
Creatinine, Ser: 1.13 mg/dL — ABNORMAL HIGH (ref 0.57–1.00)
GFR calc Af Amer: 51 mL/min/{1.73_m2} — ABNORMAL LOW (ref >59)
GFR, EST NON AFRICAN AMERICAN: 44 mL/min/{1.73_m2} — AB (ref >59)
Glucose: 110 mg/dL — ABNORMAL HIGH (ref 65–99)
PHOSPHORUS: 3.2 mg/dL (ref 2.5–4.5)
POTASSIUM: 4.2 mmol/L (ref 3.5–5.2)
Sodium: 140 mmol/L (ref 134–144)

## 2015-07-27 LAB — TSH: TSH: 2.45 u[IU]/mL (ref 0.450–4.500)

## 2015-07-27 LAB — LIPID PANEL
Chol/HDL Ratio: 3.5 ratio units (ref 0.0–4.4)
Cholesterol, Total: 150 mg/dL (ref 100–199)
HDL: 43 mg/dL (ref >39)
LDL Calculated: 87 mg/dL (ref 0–99)
Triglycerides: 101 mg/dL (ref 0–149)
VLDL Cholesterol Cal: 20 mg/dL (ref 5–40)

## 2015-08-02 ENCOUNTER — Ambulatory Visit (INDEPENDENT_AMBULATORY_CARE_PROVIDER_SITE_OTHER): Payer: PPO | Admitting: Podiatry

## 2015-08-02 ENCOUNTER — Encounter: Payer: Self-pay | Admitting: Podiatry

## 2015-08-02 DIAGNOSIS — B351 Tinea unguium: Secondary | ICD-10-CM

## 2015-08-02 DIAGNOSIS — M79676 Pain in unspecified toe(s): Secondary | ICD-10-CM

## 2015-08-05 NOTE — Progress Notes (Signed)
Patient ID: [name] [name], female   DOB: January 18, 1927, 82 y.o.   MRN: [id number]  Subjective: 82 y.o. returns the office today for painful, elongated, thickened toenails which she cannot trim herself. Denies any redness or drainage around the nails. Denies any acute changes since last appointment and no new complaints today. Denies any systemic complaints such as fevers, chills, nausea, vomiting.   Objective: AAO 3, NAD DP/PT pulses palpable, CRT less than 3 seconds Protective sensation decreased with Simms Weinstein monofilament Nails hypertrophic, dystrophic, elongated, brittle, discolored 10. There is tenderness overlying the nails 1-5 bilaterally. There is no surrounding erythema or drainage along the nail sites. No open lesions or pre-ulcerative lesions are identified.  No other areas of tenderness bilateral lower extremities. No overlying edema, erythema, increased warmth. No pain with calf compression, swelling, warmth, erythema.  Assessment: Patient presents with symptomatic onychomycosis  Plan: -Treatment options including alternatives, risks, complications were discussed -Nails sharply debrided 10 without complication/bleeding. -Discussed daily foot inspection. If there are any changes, to call the office immediately.  -Follow-up in 3 months or sooner if any problems are to arise. In the meantime, encouraged to call the office with any questions, concerns, changes symptoms.  Celesta Gentile, DPM

## 2015-10-02 ENCOUNTER — Other Ambulatory Visit: Payer: Self-pay | Admitting: Family Medicine

## 2015-11-08 ENCOUNTER — Encounter: Payer: Self-pay | Admitting: Podiatry

## 2015-11-08 ENCOUNTER — Ambulatory Visit (INDEPENDENT_AMBULATORY_CARE_PROVIDER_SITE_OTHER): Payer: PPO | Admitting: Podiatry

## 2015-11-08 DIAGNOSIS — B351 Tinea unguium: Secondary | ICD-10-CM | POA: Diagnosis not present

## 2015-11-08 DIAGNOSIS — M79676 Pain in unspecified toe(s): Secondary | ICD-10-CM | POA: Diagnosis not present

## 2015-11-08 NOTE — Progress Notes (Signed)
Patient ID: [name] [name], female   DOB: 06-Aug-1926, 82 y.o.   MRN: [id number]  Subjective: 82 y.o. returns the office today for painful, elongated, thickened toenails which she cannot trim herself. Denies any redness or drainage around the nails. Denies any acute changes since last appointment and no new complaints today. Denies any systemic complaints such as fevers, chills, nausea, vomiting.   Objective: AAO 3, NAD DP/PT pulses palpable, CRT less than 3 seconds Nails hypertrophic, dystrophic, elongated, brittle, discolored 10. There is tenderness overlying the nails 1-5 bilaterally. There is no surrounding erythema or drainage along the nail sites. No open lesions or pre-ulcerative lesions are identified.  No other areas of tenderness bilateral lower extremities. No overlying edema, erythema, increased warmth. No pain with calf compression, swelling, warmth, erythema.  Assessment: Patient presents with symptomatic onychomycosis  Plan: -Treatment options including alternatives, risks, complications were discussed -Nails sharply debrided 9 without complication/bleeding. -Discussed daily foot inspection. If there are any changes, to call the office immediately.  -Follow-up in 3 months or sooner if any problems are to arise. In the meantime, encouraged to call the office with any questions, concerns, changes symptoms.  Celesta Gentile, DPM

## 2015-11-28 ENCOUNTER — Telehealth: Payer: Self-pay | Admitting: Gastroenterology

## 2015-11-28 NOTE — Telephone Encounter (Signed)
RECALL FOR TCS °

## 2015-11-29 NOTE — Telephone Encounter (Signed)
Letter mailed

## 2016-01-17 ENCOUNTER — Ambulatory Visit: Payer: PPO | Admitting: Podiatry

## 2016-01-17 DIAGNOSIS — H353122 Nonexudative age-related macular degeneration, left eye, intermediate dry stage: Secondary | ICD-10-CM | POA: Diagnosis not present

## 2016-01-21 ENCOUNTER — Ambulatory Visit (INDEPENDENT_AMBULATORY_CARE_PROVIDER_SITE_OTHER): Payer: PPO | Admitting: Podiatry

## 2016-01-21 ENCOUNTER — Encounter: Payer: Self-pay | Admitting: Podiatry

## 2016-01-21 VITALS — Resp 16 | Wt 162.0 lb

## 2016-01-21 DIAGNOSIS — B351 Tinea unguium: Secondary | ICD-10-CM | POA: Diagnosis not present

## 2016-01-21 DIAGNOSIS — M79676 Pain in unspecified toe(s): Secondary | ICD-10-CM | POA: Diagnosis not present

## 2016-01-21 NOTE — Progress Notes (Signed)
Complaint:  Visit Type: Patient returns to my office for continued preventative foot care services. Complaint: Patient states" my nails have grown long and thick and become painful to walk and wear shoes" . The patient presents for preventative foot care services. No changes to ROS  Podiatric Exam: Vascular: dorsalis pedis and posterior tibial pulses are palpable bilateral. Capillary return is immediate. Temperature gradient is WNL. Skin turgor WNL  Sensorium: Normal Semmes Weinstein monofilament test. Normal tactile sensation bilaterally. Nail Exam: Pt has thick disfigured discolored nails with subungual debris noted bilateral entire nail hallux through fifth toenails Ulcer Exam: There is no evidence of ulcer or pre-ulcerative changes or infection. Orthopedic Exam: Muscle tone and strength are WNL. No limitations in general ROM. No crepitus or effusions noted. Foot type and digits show no abnormalities. Bony prominences are unremarkable. Skin: No Porokeratosis. No infection or ulcers  Diagnosis:  Onychomycosis, , Pain in right toe, pain in left toes  Treatment & Plan Procedures and Treatment: Consent by patient was obtained for treatment procedures. The patient understood the discussion of treatment and procedures well. All questions were answered thoroughly reviewed. Debridement of mycotic and hypertrophic toenails, 1 through 5 bilateral and clearing of subungual debris. No ulceration, no infection noted.  Return Visit-Office Procedure: Patient instructed to return to the office for a follow up visit 3 months   for continued evaluation and treatment.    Jaquawn Saffran DPM 

## 2016-01-23 ENCOUNTER — Encounter: Payer: Self-pay | Admitting: Family Medicine

## 2016-01-23 ENCOUNTER — Ambulatory Visit (INDEPENDENT_AMBULATORY_CARE_PROVIDER_SITE_OTHER): Payer: PPO | Admitting: Family Medicine

## 2016-01-23 VITALS — BP 150/70 | HR 97 | Temp 97.9°F | Resp 16 | Wt 163.0 lb

## 2016-01-23 DIAGNOSIS — R7303 Prediabetes: Secondary | ICD-10-CM

## 2016-01-23 DIAGNOSIS — E039 Hypothyroidism, unspecified: Secondary | ICD-10-CM

## 2016-01-23 DIAGNOSIS — I10 Essential (primary) hypertension: Secondary | ICD-10-CM | POA: Diagnosis not present

## 2016-01-23 LAB — POCT GLYCOSYLATED HEMOGLOBIN (HGB A1C)
ESTIMATED AVERAGE GLUCOSE: 120
HEMOGLOBIN A1C: 5.8

## 2016-01-23 MED ORDER — AMLODIPINE BESYLATE 5 MG PO TABS: 5.0000 mg | ORAL_TABLET | Freq: Every day | ORAL | 3 refills | Status: DC

## 2016-01-23 NOTE — Progress Notes (Signed)
Patient: [name] [name] Female    DOB: April 09, 1926   82 y.o.   MRN: [id number] Visit Date: 01/23/2016  Today's Provider: [name], MD   Chief Complaint  Patient presents with  . Hypertension    follow up  . Hypothyroidism    follow up  . Hyperglycemia    follow up   Subjective:    HPI  Hypertension, follow-up:  BP Readings from Last 3 Encounters:  07/25/15 (!) 142/80  05/02/15 (!) 160/92  05/01/15 (!) 176/90    She was last seen for hypertension 5 months ago.  BP at that visit was 142/80. Management since that visit includes no changes. She reports good compliance with treatment. She is not having side effects.  She is exercising. She is adherent to low salt diet.   Outside blood pressures are not being checked. She is experiencing dyspnea and lower extremity edema.  Patient denies chest pain, chest pressure/discomfort, claudication, exertional chest pressure/discomfort, fatigue, irregular heart beat, near-syncope, orthopnea, palpitations, paroxysmal nocturnal dyspnea, syncope and tachypnea.   Cardiovascular risk factors include advanced age (older than 85 for men, 12 for women) and hypertension.  Use of agents associated with hypertension: NSAIDS and thyroid hormones.     Weight trend: fluctuating a bit Wt Readings from Last 3 Encounters:  01/21/16 162 lb (73.5 kg)  07/25/15 162 lb (73.5 kg)  05/02/15 160 lb (72.6 kg)    Current diet: well balanced  ------------------------------------------------------------------------  Prediabetes, Follow-up:   Lab Results  Component Value Date   HGBA1C 5.6 05/02/2015   HGBA1C 5.8 10/09/2014   HGBA1C 5.8 10/10/2013   GLUCOSE 110 (H) 07/26/2015   GLUCOSE 117 (H) 10/09/2014   GLUCOSE 134 (H) 12/03/2006    Last seen for for this6 months ago.  Management since that visit includes no changes. Current symptoms include polyuria and visual disturbances and have been stable.  Weight trend: fluctuating a  bit Prior visit with dietician: no Current diet: well balanced Current exercise: stationary bike  Pertinent Labs:    Component Value Date/Time   CHOL 150 07/26/2015 0823   TRIG 101 07/26/2015 0823   CHOLHDL 3.5 07/26/2015 0823   CREATININE 1.13 (H) 07/26/2015 0823    Wt Readings from Last 3 Encounters:  01/21/16 162 lb (73.5 kg)  07/25/15 162 lb (73.5 kg)  05/02/15 160 lb (72.6 kg)   Follow up Hypothyroidism:  Patient was last seen for this problem 5 months ago and no changes were made. Patient reports good compliance with treatment and good tolerance.  Lab Results  Component Value Date   TSH 2.450 07/26/2015       Allergies  Allergen Reactions  . Atorvastatin     myalgias/arthralgias.  . Lovastatin     Other reaction(s): Muscle Pain     Current Outpatient Prescriptions:  .  allopurinol (ZYLOPRIM) 100 MG tablet, TAKE TWO TABLETS EVERY DAY, Disp: 180 tablet, Rfl: 4 .  amLODipine (NORVASC) 2.5 MG tablet, Take 1 tablet (2.5 mg total) by mouth daily., Disp: 90 tablet, Rfl: 3 .  aspirin 81 MG tablet, Take 81 mg by mouth daily. , Disp: , Rfl:  .  CALCIUM CARBONATE-VITAMIN D PO, Take 2 tablets by mouth daily. , Disp: , Rfl:  .  GARLIC PO, Take 5 mg by mouth daily., Disp: , Rfl:  .  ibuprofen (ADVIL,MOTRIN) 600 MG tablet, Take by mouth., Disp: , Rfl:  .  Krill Oil 300 MG CAPS, Take 300 mg by mouth daily.,  Disp: , Rfl:  .  levothyroxine (SYNTHROID, LEVOTHROID) 75 MCG tablet, TAKE ONE TABLET EVERY DAY, Disp: 90 tablet, Rfl: 12 .  MULTIPLE MINERALS-VITAMINS PO, Take by mouth daily. , Disp: , Rfl:  .  Multiple Vitamins-Minerals (PRESERVISION AREDS 2 PO), Take 2 tablets by mouth daily., Disp: , Rfl:  .  simvastatin (ZOCOR) 10 MG tablet, TAKE ONE TABLET EVERY EVENING, Disp: 90 tablet, Rfl: 3 .  valsartan-hydrochlorothiazide (DIOVAN-HCT) 320-25 MG tablet, TAKE ONE TABLET EVERY DAY, Disp: 90 tablet, Rfl: 4  Review of Systems  Constitutional: Negative for appetite change, chills,  fatigue and fever.  Respiratory: Positive for shortness of breath. Negative for chest tightness.   Cardiovascular: Positive for leg swelling. Negative for chest pain and palpitations.  Gastrointestinal: Negative for abdominal pain, nausea and vomiting.  Endocrine: Positive for polyuria. Negative for polydipsia and polyphagia.  Neurological: Positive for headaches. Negative for dizziness and weakness.    Social History  Substance Use Topics  . Smoking status: Former Smoker    Quit date: 02/18/2000  . Smokeless tobacco: Never Used  . Alcohol use No   Objective:   BP (!) 150/70 (BP Location: Left Arm, Patient Position: Sitting, Cuff Size: Normal)   Pulse 97   Temp 97.9 F (36.6 C) (Oral)   Resp 16   Wt 163 lb (73.9 kg)   SpO2 (!) 68% Comment: room air  BMI 22.73 kg/m   Physical Exam   General Appearance:    Alert, cooperative, no distress  Eyes:    PERRL, conjunctiva/corneas clear, EOM's intact       Lungs:     Clear to auscultation bilaterally, respirations unlabored  Heart:    Regular rate and rhythm  Neurologic:   Awake, alert, oriented x 3. No apparent focal neurological           defect.        Results for orders placed or performed in visit on 01/23/16  POCT HgB A1C  Result Value Ref Range   Hemoglobin A1C 5.8    Est. average glucose Bld gHb Est-mCnc 120        Assessment & Plan:     1. Prediabetes A1c is stable. Check Q6-12 months.  - POCT HgB A1C  2. Essential hypertension Stable. Continue current medications.   - amLODipine (NORVASC) 5 MG tablet; Take 1 tablet (5 mg total) by mouth daily.  Dispense: 90 tablet; Refill: 3  3. Hypothyroidism, unspecified type Doing well current thyroid replacement.        [name], MD  [facility]

## 2016-02-01 ENCOUNTER — Ambulatory Visit: Payer: PPO

## 2016-02-01 ENCOUNTER — Other Ambulatory Visit: Payer: Self-pay | Admitting: Pharmacist

## 2016-02-01 NOTE — Patient Outreach (Signed)
Outreach call to [name] [name] regarding her request for follow up from the [facility] Medication Adherence Campaign. Left a HIPAA compliant message on the patient's voicemail.  Harlow Asa, PharmD, [facility] [phone]

## 2016-02-19 ENCOUNTER — Encounter: Payer: Self-pay | Admitting: Family Medicine

## 2016-02-19 ENCOUNTER — Ambulatory Visit (INDEPENDENT_AMBULATORY_CARE_PROVIDER_SITE_OTHER): Payer: PPO | Admitting: Family Medicine

## 2016-02-19 VITALS — BP 142/60 | HR 70 | Temp 99.0°F | Resp 16 | Wt 165.0 lb

## 2016-02-19 DIAGNOSIS — J069 Acute upper respiratory infection, unspecified: Secondary | ICD-10-CM | POA: Diagnosis not present

## 2016-02-19 DIAGNOSIS — B9789 Other viral agents as the cause of diseases classified elsewhere: Secondary | ICD-10-CM | POA: Diagnosis not present

## 2016-02-19 NOTE — Patient Instructions (Signed)
Discussed use of Mucinex DM.

## 2016-02-19 NOTE — Progress Notes (Signed)
Subjective:     Patient ID: [name], female   DOB: 1926/02/19, 83 y.o.   MRN: [id number]  HPI  Chief Complaint  Patient presents with  . Cough    Patient has had cough X 1 week. Patient has been taking Coricidin for her cough which has helped a little. She denies fever, but does have PND.   States sinuses have improved and sputum is getting lighter in color. Has been using Coricidin for her sx. Accompanied by her son-in-law.   Review of Systems     Objective:   Physical Exam  Constitutional: She appears well-developed and well-nourished. No distress.  Ears: T.M's intact without inflammation Throat: no tonsillar enlargement or exudate Neck: no cervical adenopathy Lungs: clear     Assessment:    1. Viral upper respiratory tract infection     Plan:    Discussed use of Mucinex DM. Call if cough not improving over the next few days.

## 2016-04-14 ENCOUNTER — Other Ambulatory Visit: Payer: Self-pay | Admitting: Family Medicine

## 2016-05-05 ENCOUNTER — Ambulatory Visit (INDEPENDENT_AMBULATORY_CARE_PROVIDER_SITE_OTHER): Payer: PPO | Admitting: Podiatry

## 2016-05-05 ENCOUNTER — Encounter: Payer: Self-pay | Admitting: Podiatry

## 2016-05-05 DIAGNOSIS — M79676 Pain in unspecified toe(s): Secondary | ICD-10-CM

## 2016-05-05 DIAGNOSIS — B351 Tinea unguium: Secondary | ICD-10-CM | POA: Diagnosis not present

## 2016-05-05 NOTE — Progress Notes (Signed)
Complaint:  Visit Type: Patient returns to my office for continued preventative foot care services. Complaint: Patient states" my nails have grown long and thick and become painful to walk and wear shoes" . The patient presents for preventative foot care services. No changes to ROS  Podiatric Exam: Vascular: dorsalis pedis and posterior tibial pulses are palpable bilateral. Capillary return is immediate. Temperature gradient is WNL. Skin turgor WNL  Sensorium: Normal Semmes Weinstein monofilament test. Normal tactile sensation bilaterally. Nail Exam: Pt has thick disfigured discolored nails with subungual debris noted bilateral entire nail hallux through fifth toenails Ulcer Exam: There is no evidence of ulcer or pre-ulcerative changes or infection. Orthopedic Exam: Muscle tone and strength are WNL. No limitations in general ROM. No crepitus or effusions noted. Foot type and digits show no abnormalities. Bony prominences are unremarkable. Skin: No Porokeratosis. No infection or ulcers  Diagnosis:  Onychomycosis, , Pain in right toe, pain in left toes  Treatment & Plan Procedures and Treatment: Consent by patient was obtained for treatment procedures. The patient understood the discussion of treatment and procedures well. All questions were answered thoroughly reviewed. Debridement of mycotic and hypertrophic toenails, 1 through 5 bilateral and clearing of subungual debris. No ulceration, no infection noted.  Return Visit-Office Procedure: Patient instructed to return to the office for a follow up visit 3 months   for continued evaluation and treatment.    Lorren Splawn DPM 

## 2016-06-27 ENCOUNTER — Other Ambulatory Visit: Payer: Self-pay | Admitting: Family Medicine

## 2016-07-17 DIAGNOSIS — H353122 Nonexudative age-related macular degeneration, left eye, intermediate dry stage: Secondary | ICD-10-CM | POA: Diagnosis not present

## 2016-08-05 ENCOUNTER — Ambulatory Visit (INDEPENDENT_AMBULATORY_CARE_PROVIDER_SITE_OTHER): Payer: PPO

## 2016-08-05 VITALS — BP 160/80 | HR 68 | Temp 98.6°F | Ht 61.0 in | Wt 164.4 lb

## 2016-08-05 DIAGNOSIS — Z0000 Encounter for general adult medical examination without abnormal findings: Secondary | ICD-10-CM

## 2016-08-05 NOTE — Patient Instructions (Signed)
[name] [name] , Thank you for taking time to come for your Medicare Wellness Visit. I appreciate your ongoing commitment to your health goals. Please review the following plan we discussed and let me know if I can assist you in the future.   Screening recommendations/referrals: Colonoscopy: completed in 2007 Mammogram: completed 10/22/12 Bone Density: completed 06/25/10 Recommended yearly ophthalmology/optometry visit for glaucoma screening and checkup Recommended yearly dental visit for hygiene and checkup  Vaccinations: Influenza vaccine: up to date, due 10/2016 Pneumococcal vaccine: completed series Tdap vaccine: completed 04/29/10, due 04/2020 Shingles vaccine: completed 04/29/10  Advanced directives: Please bring a copy of your POA (Power of [place]) and/or Living Will to your next appointment.   Conditions/risks identified: Fall risk; Recommend increasing water intake to 4 glasses a day.   Next appointment: 08/06/16 @ 9:00 AM   Preventive Care 65 Years and Older, Female Preventive care refers to lifestyle choices and visits with your health care provider that can promote health and wellness. What does preventive care include?  A yearly physical exam. This is also called an annual well check.  Dental exams once or twice a year.  Routine eye exams. Ask your health care provider how often you should have your eyes checked.  Personal lifestyle choices, including:  Daily care of your teeth and gums.  Regular physical activity.  Eating a healthy diet.  Avoiding tobacco and drug use.  Limiting alcohol use.  Practicing safe sex.  Taking low-dose aspirin every day.  Taking vitamin and mineral supplements as recommended by your health care provider. What happens during an annual well check? The services and screenings done by your health care provider during your annual well check will depend on your age, overall health, lifestyle risk factors, and family history of  disease. Counseling  Your health care provider may ask you questions about your:  Alcohol use.  Tobacco use.  Drug use.  Emotional well-being.  Home and relationship well-being.  Sexual activity.  Eating habits.  History of falls.  Memory and ability to understand (cognition).  Work and work Statistician.  Reproductive health. Screening  You may have the following tests or measurements:  Height, weight, and BMI.  Blood pressure.  Lipid and cholesterol levels. These may be checked every 5 years, or more frequently if you are over 86 years old.  Skin check.  Lung cancer screening. You may have this screening every year starting at age 81 if you have a 30-pack-year history of smoking and currently smoke or have quit within the past 15 years.  Fecal occult blood test (FOBT) of the stool. You may have this test every year starting at age 37.  Flexible sigmoidoscopy or colonoscopy. You may have a sigmoidoscopy every 5 years or a colonoscopy every 10 years starting at age 57.  Hepatitis C blood test.  Hepatitis B blood test.  Sexually transmitted disease (STD) testing.  Diabetes screening. This is done by checking your blood sugar (glucose) after you have not eaten for a while (fasting). You may have this done every 1-3 years.  Bone density scan. This is done to screen for osteoporosis. You may have this done starting at age 67.  Mammogram. This may be done every 1-2 years. Talk to your health care provider about how often you should have regular mammograms. Talk with your health care provider about your test results, treatment options, and if necessary, the need for more tests. Vaccines  Your health care provider may recommend certain vaccines, such as:  Influenza vaccine. This is recommended every year.  Tetanus, diphtheria, and acellular pertussis (Tdap, Td) vaccine. You may need a Td booster every 10 years.  Zoster vaccine. You may need this after age  7.  Pneumococcal 13-valent conjugate (PCV13) vaccine. One dose is recommended after age 54.  Pneumococcal polysaccharide (PPSV23) vaccine. One dose is recommended after age 41. Talk to your health care provider about which screenings and vaccines you need and how often you need them. This information is not intended to replace advice given to you by your health care provider. Make sure you discuss any questions you have with your health care provider. Document Released: 03/02/2015 Document Revised: 10/24/2015 Document Reviewed: 12/05/2014 Elsevier Interactive Patient Education  2017 [place] Prevention in the Home Falls can cause injuries. They can happen to people of all ages. There are many things you can do to make your home safe and to help prevent falls. What can I do on the outside of my home?  Regularly fix the edges of walkways and driveways and fix any cracks.  Remove anything that might make you trip as you walk through a door, such as a raised step or threshold.  Trim any bushes or trees on the path to your home.  Use bright outdoor lighting.  Clear any walking paths of anything that might make someone trip, such as rocks or tools.  Regularly check to see if handrails are loose or broken. Make sure that both sides of any steps have handrails.  Any raised decks and porches should have guardrails on the edges.  Have any leaves, snow, or ice cleared regularly.  Use sand or salt on walking paths during winter.  Clean up any spills in your garage right away. This includes oil or grease spills. What can I do in the bathroom?  Use night lights.  Install grab bars by the toilet and in the tub and shower. Do not use towel bars as grab bars.  Use non-skid mats or decals in the tub or shower.  If you need to sit down in the shower, use a plastic, non-slip stool.  Keep the floor dry. Clean up any water that spills on the floor as soon as it happens.  Remove  soap buildup in the tub or shower regularly.  Attach bath mats securely with double-sided non-slip rug tape.  Do not have throw rugs and other things on the floor that can make you trip. What can I do in the bedroom?  Use night lights.  Make sure that you have a light by your bed that is easy to reach.  Do not use any sheets or blankets that are too big for your bed. They should not hang down onto the floor.  Have a firm chair that has side arms. You can use this for support while you get dressed.  Do not have throw rugs and other things on the floor that can make you trip. What can I do in the kitchen?  Clean up any spills right away.  Avoid walking on wet floors.  Keep items that you use a lot in easy-to-reach places.  If you need to reach something above you, use a strong step stool that has a grab bar.  Keep electrical cords out of the way.  Do not use floor polish or wax that makes floors slippery. If you must use wax, use non-skid floor wax.  Do not have throw rugs and other things on the floor that  can make you trip. What can I do with my stairs?  Do not leave any items on the stairs.  Make sure that there are handrails on both sides of the stairs and use them. Fix handrails that are broken or loose. Make sure that handrails are as long as the stairways.  Check any carpeting to make sure that it is firmly attached to the stairs. Fix any carpet that is loose or worn.  Avoid having throw rugs at the top or bottom of the stairs. If you do have throw rugs, attach them to the floor with carpet tape.  Make sure that you have a light switch at the top of the stairs and the bottom of the stairs. If you do not have them, ask someone to add them for you. What else can I do to help prevent falls?  Wear shoes that:  Do not have high heels.  Have rubber bottoms.  Are comfortable and fit you well.  Are closed at the toe. Do not wear sandals.  If you use a  stepladder:  Make sure that it is fully opened. Do not climb a closed stepladder.  Make sure that both sides of the stepladder are locked into place.  Ask someone to hold it for you, if possible.  Clearly mark and make sure that you can see:  Any grab bars or handrails.  First and last steps.  Where the edge of each step is.  Use tools that help you move around (mobility aids) if they are needed. These include:  Canes.  Walkers.  Scooters.  Crutches.  Turn on the lights when you go into a dark area. Replace any light bulbs as soon as they burn out.  Set up your furniture so you have a clear path. Avoid moving your furniture around.  If any of your floors are uneven, fix them.  If there are any pets around you, be aware of where they are.  Review your medicines with your doctor. Some medicines can make you feel dizzy. This can increase your chance of falling. Ask your doctor what other things that you can do to help prevent falls. This information is not intended to replace advice given to you by your health care provider. Make sure you discuss any questions you have with your health care provider. Document Released: 11/30/2008 Document Revised: 07/12/2015 Document Reviewed: 03/10/2014 Elsevier Interactive Patient Education  2017 [employer].

## 2016-08-05 NOTE — Progress Notes (Signed)
Subjective:   [name] [name] is a 83 y.o. female who presents for Medicare Annual (Subsequent) preventive examination.  Review of Systems:  N/A  Cardiac Risk Factors include: advanced age (>27men, >63 women);dyslipidemia;hypertension;obesity (BMI >30kg/m2)     Objective:     Vitals: BP (!) 160/80 (BP Location: Right Arm)   Pulse 68   Temp 98.6 F (37 C) (Oral)   Ht 5\' 1"  (1.549 m)   Wt 164 lb 6.4 oz (74.6 kg)   BMI 31.06 kg/m   Body mass index is 31.06 kg/m.   Tobacco History  Smoking Status  . Former Smoker  . Types: Cigarettes  . Quit date: 02/18/2000  Smokeless Tobacco  . Never Used     Counseling given: Not Answered   Past Medical History:  Diagnosis Date  . Hyperlipidemia   . Hypertension    Past Surgical History:  Procedure Laterality Date  . ABDOMINAL HYSTERECTOMY  1995   abdominal-uterine prolapse, 1 ovary removed, bladder tact  . CARDIAC CATHETERIZATION    . CHOLECYSTECTOMY    . TONSILLECTOMY AND ADENOIDECTOMY     Family History  Problem Relation Age of Onset  . Ovarian cancer Mother   . Alcohol abuse Sister    History  Sexual Activity  . Sexual activity: No    Outpatient Encounter Prescriptions as of 08/05/2016  Medication Sig  . allopurinol (ZYLOPRIM) 100 MG tablet TAKE TWO TABLETS EVERY DAY  . amLODipine (NORVASC) 5 MG tablet Take 1 tablet (5 mg total) by mouth daily.  Marland Kitchen aspirin 81 MG tablet Take 81 mg by mouth daily.   Marland Kitchen CALCIUM CARBONATE-VITAMIN D PO Take 2 tablets by mouth daily.   Marland Kitchen GARLIC PO Take 5 mg by mouth daily.  Marland Kitchen ibuprofen (ADVIL,MOTRIN) 600 MG tablet Take by mouth every 6 (six) hours as needed.   Javier Docker Oil 300 MG CAPS Take 300 mg by mouth daily.  Marland Kitchen levothyroxine (SYNTHROID, LEVOTHROID) 75 MCG tablet TAKE ONE TABLET EVERY DAY  . Multiple Vitamins-Minerals (PRESERVISION AREDS 2 PO) Take 2 tablets by mouth daily.  . simvastatin (ZOCOR) 10 MG tablet TAKE ONE TABLET BY MOUTH EVERY EVENING  . valsartan-hydrochlorothiazide  (DIOVAN-HCT) 320-25 MG tablet TAKE ONE TABLET EVERY DAY  . [DISCONTINUED] MULTIPLE MINERALS-VITAMINS PO Take by mouth daily.    No facility-administered encounter medications on file as of 08/05/2016.     Activities of Daily Living In your present state of health, do you have any difficulty performing the following activities: 08/05/2016  Hearing? Y  Vision? Y  Difficulty concentrating or making decisions? Y  Walking or climbing stairs? Y  Dressing or bathing? N  Doing errands, shopping? Y  Preparing Food and eating ? Y  Using the Toilet? N  In the past six months, have you accidently leaked urine? Y  Do you have problems with loss of bowel control? N  Managing your Medications? N  Managing your Finances? Y  Housekeeping or managing your Housekeeping? Y  Some recent data might be hidden    Patient Care Team: Birdie Sons, MD as PCP - General (Family Medicine)    Assessment:     Exercise Activities and Dietary recommendations Current Exercise Habits: Home exercise routine, Type of exercise: Other - see comments (stationary bicycle), Time (Minutes): 10 (to 15 minutes), Frequency (Times/Week): 4, Weekly Exercise (Minutes/Week): 40, Intensity: Mild, Exercise limited by: orthopedic condition(s)  Goals    . Increase water intake          Recommend increasing  water intake to 4 glasses a day.       Fall Risk Fall Risk  08/05/2016 01/23/2016 10/16/2014  Falls in the past year? Yes No Yes  Number falls in past yr: 1 - 2 or more  Injury with Fall? No - (No Data)  Risk Factor Category  - - High Fall Risk  Risk for fall due to : - - History of fall(s)  Follow up Falls prevention discussed - -   Depression Screen PHQ 2/9 Scores 08/05/2016 01/23/2016 10/16/2014  PHQ - 2 Score 0 0 0     Cognitive Function     6CIT Screen 08/05/2016  What Year? 0 points  What month? 0 points  What time? 0 points  Count back from 20 0 points  Months in reverse 4 points  Repeat phrase 2 points    Total Score 6    Immunization History  Administered Date(s) Administered  . Influenza Split 01/22/2015  . Influenza, High Dose Seasonal PF 12/21/2015  . Pneumococcal Conjugate-13 04/10/2014  . Pneumococcal Polysaccharide-23 12/28/2008  . Tdap 04/29/2010  . Zoster 04/29/2010   Screening Tests Health Maintenance  Topic Date Due  . INFLUENZA VACCINE  09/17/2016  . TETANUS/TDAP  04/28/2020  . DEXA SCAN  Completed  . PNA vac Low Risk Adult  Completed      Plan:  I have personally reviewed and addressed the Medicare Annual Wellness questionnaire and have noted the following in the patient's chart:  A. Medical and social history B. Use of alcohol, tobacco or illicit drugs  C. Current medications and supplements D. Functional ability and status E.  Nutritional status F.  Physical activity G. Advance directives H. List of other physicians I.  Hospitalizations, surgeries, and ER visits in previous 12 months J.  [place] such as hearing and vision if needed, cognitive and depression L. Referrals and appointments - none  In addition, I have reviewed and discussed with patient certain preventive protocols, quality metrics, and best practice recommendations. A written personalized care plan for preventive services as well as general preventive health recommendations were provided to patient.  See attached scanned questionnaire for additional information.   Signed,  Fabio Neighbors, LPN Nurse Health Advisor   MD Recommendations:

## 2016-08-06 ENCOUNTER — Ambulatory Visit (INDEPENDENT_AMBULATORY_CARE_PROVIDER_SITE_OTHER): Payer: PPO | Admitting: Family Medicine

## 2016-08-06 ENCOUNTER — Encounter: Payer: Self-pay | Admitting: Family Medicine

## 2016-08-06 VITALS — BP 158/76 | HR 66 | Temp 97.8°F | Resp 16 | Ht 61.0 in | Wt 162.0 lb

## 2016-08-06 DIAGNOSIS — E7800 Pure hypercholesterolemia, unspecified: Secondary | ICD-10-CM | POA: Diagnosis not present

## 2016-08-06 DIAGNOSIS — D631 Anemia in chronic kidney disease: Secondary | ICD-10-CM | POA: Diagnosis not present

## 2016-08-06 DIAGNOSIS — I10 Essential (primary) hypertension: Secondary | ICD-10-CM

## 2016-08-06 DIAGNOSIS — E2839 Other primary ovarian failure: Secondary | ICD-10-CM | POA: Diagnosis not present

## 2016-08-06 DIAGNOSIS — Z6830 Body mass index (BMI) 30.0-30.9, adult: Secondary | ICD-10-CM | POA: Diagnosis not present

## 2016-08-06 DIAGNOSIS — E669 Obesity, unspecified: Secondary | ICD-10-CM | POA: Diagnosis not present

## 2016-08-06 DIAGNOSIS — R7303 Prediabetes: Secondary | ICD-10-CM

## 2016-08-06 DIAGNOSIS — M8580 Other specified disorders of bone density and structure, unspecified site: Secondary | ICD-10-CM

## 2016-08-06 DIAGNOSIS — N183 Chronic kidney disease, stage 3 unspecified: Secondary | ICD-10-CM

## 2016-08-06 DIAGNOSIS — E039 Hypothyroidism, unspecified: Secondary | ICD-10-CM | POA: Diagnosis not present

## 2016-08-06 MED ORDER — AMLODIPINE BESYLATE 5 MG PO TABS: ORAL_TABLET | ORAL | 1 refills | Status: DC

## 2016-08-06 NOTE — Patient Instructions (Addendum)
   Start taking amlodipine in the eve1ning instead of the morning.

## 2016-08-06 NOTE — Progress Notes (Signed)
Patient: [name] [name], Female    DOB: 1926/06/16, 83 y.o.   MRN: [id number] Visit Date: 08/06/2016  Today's Provider: [name], MD   Chief Complaint  Patient presents with  . Annual Exam  . Hypertension    follow up  . Hypothyroidism    follow up  . Hyperglycemia    follow up   Subjective:    Annual physical exam [name] [name] is a 83 y.o. female who presents today for health maintenance and complete physical. She feels well. She reports exercising a few times a week on stationary equipment. She reports she is sleeping well.  -----------------------------------------------------------------  Hypertension, follow-up:  BP Readings from Last 3 Encounters:  08/05/16 (!) 160/80  02/19/16 (!) 142/60  01/23/16 (!) 150/70   She did not take her BP medications today Home BP 150s in the morning and 130s in the afternoon.  She was last seen for hypertension 6 months ago.  BP at that visit was 150/70. Management since that visit includes no changes. She reports good compliance with treatment. She is not having side effects.  She is exercising. She is adherent to low salt diet.   Outside blood pressures are checked occasionally. She is experiencing dyspnea and leg leg swelling.  Patient denies chest pain, chest pressure/discomfort, claudication, dyspnea, exertional chest pressure/discomfort, fatigue, irregular heart beat, near-syncope, orthopnea, palpitations, paroxysmal nocturnal dyspnea, syncope and tachypnea.   Cardiovascular risk factors include advanced age (older than 60 for men, 37 for women) and hypertension.  Use of agents associated with hypertension: NSAIDS.     Weight trend: fluctuating a bit Wt Readings from Last 3 Encounters:  08/05/16 164 lb 6.4 oz (74.6 kg)  02/19/16 165 lb (74.8 kg)  01/23/16 163 lb (73.9 kg)    Current diet: well balanced  ------------------------------------------------------------------------ Follow up of  Hypothyroidism:  Patient was last seen for this problem 6 months ago and no changes were made. Patient reports good compliance with treatment, and good tolerance.  Lab Results  Component Value Date   TSH 2.450 07/26/2015     Prediabetes, Follow-up:   Lab Results  Component Value Date   HGBA1C 5.8 01/23/2016   HGBA1C 5.6 05/02/2015   HGBA1C 5.8 10/09/2014   GLUCOSE 110 (H) 07/26/2015   GLUCOSE 117 (H) 10/09/2014   GLUCOSE 134 (H) 12/03/2006    Last seen for for this6 months ago.  Management since that visit includes no changes. Current symptoms include polyuria and have been stable.  Weight trend: fluctuating a bit Prior visit with dietician: no Current diet: well balanced Current exercise: stationary equipment  Pertinent Labs:    Component Value Date/Time   CHOL 150 07/26/2015 0823   TRIG 101 07/26/2015 0823   CHOLHDL 3.5 07/26/2015 0823   CREATININE 1.13 (H) 07/26/2015 0823    Wt Readings from Last 3 Encounters:  08/05/16 164 lb 6.4 oz (74.6 kg)  02/19/16 165 lb (74.8 kg)  01/23/16 163 lb (73.9 kg)     Review of Systems  Constitutional: Negative for chills, fatigue and fever.  HENT: Negative for congestion, ear pain, rhinorrhea, sneezing and sore throat.   Eyes: Negative.  Negative for pain and redness.  Respiratory: Positive for shortness of breath (with exertion). Negative for cough and wheezing.   Cardiovascular: Positive for leg swelling. Negative for chest pain.  Gastrointestinal: Negative for abdominal pain, blood in stool, constipation, diarrhea and nausea.  Endocrine: Positive for polyuria. Negative for polydipsia and polyphagia.  Genitourinary: Negative.  Negative for dysuria, flank pain, hematuria, pelvic pain, vaginal bleeding and vaginal discharge.  Musculoskeletal: Negative for arthralgias, back pain, gait problem and joint swelling.  Skin: Negative for rash.  Neurological: Negative.  Negative for dizziness, tremors, seizures, weakness,  light-headedness, numbness and headaches.  Hematological: Negative for adenopathy. Bruises/bleeds easily.  Psychiatric/Behavioral: Negative.  Negative for behavioral problems, confusion and dysphoric mood. The patient is not nervous/anxious and is not hyperactive.     Social History      She  reports that she quit smoking about 16 years ago. Her smoking use included Cigarettes. She has never used smokeless tobacco. She reports that she does not drink alcohol or use drugs.       Social History   Social History  . Marital status: Widowed    Spouse name: N/A  . Number of children: 5  . Years of education: N/A   Occupational History  . Retired    Social History Main Topics  . Smoking status: Former Smoker    Types: Cigarettes    Quit date: 02/18/2000  . Smokeless tobacco: Never Used  . Alcohol use No  . Drug use: No  . Sexual activity: No   Other Topics Concern  . None   Social History Narrative  . None    Past Medical History:  Diagnosis Date  . Hyperlipidemia   . Hypertension      Patient Active Problem List   Diagnosis Date Noted  . Right foot ulcer ([place]) 01/24/2015  . Anemia 08/16/2014  . Chronic kidney disease (CKD), stage III (moderate) 08/16/2014  . Esophageal reflux 08/16/2014  . Obesity 08/16/2014  . Osteopenia 08/16/2014  . Varicose veins 08/16/2014  . Prediabetes 10/02/2008  . CAD in native artery 09/26/2008  . Gout 09/26/2008  . History of tobacco use 09/26/2008  . Hypertension 09/26/2008  . Hypothyroid 09/26/2008  . Hypercholesterolemia without hypertriglyceridemia 09/26/2008    Past Surgical History:  Procedure Laterality Date  . ABDOMINAL HYSTERECTOMY  1995   abdominal-uterine prolapse, 1 ovary removed, bladder tact  . CARDIAC CATHETERIZATION    . CHOLECYSTECTOMY    . TONSILLECTOMY AND ADENOIDECTOMY      Family History        Family Status  Relation Status  . Mother Deceased at age 69  . Father Deceased at age 15  . Sister Alive  .  Sister Alive  . Sister Alive        Her family history includes Alcohol abuse in her sister; Ovarian cancer in her mother.     Allergies  Allergen Reactions  . Atorvastatin     myalgias/arthralgias.  . Lovastatin     Other reaction(s): Muscle Pain     Current Outpatient Prescriptions:  .  allopurinol (ZYLOPRIM) 100 MG tablet, TAKE TWO TABLETS EVERY DAY, Disp: 180 tablet, Rfl: 4 .  amLODipine (NORVASC) 5 MG tablet, Take 1 tablet (5 mg total) by mouth daily., Disp: 90 tablet, Rfl: 3 .  aspirin 81 MG tablet, Take 81 mg by mouth daily. , Disp: , Rfl:  .  CALCIUM CARBONATE-VITAMIN D PO, Take 2 tablets by mouth daily. , Disp: , Rfl:  .  GARLIC PO, Take 5 mg by mouth daily., Disp: , Rfl:  .  ibuprofen (ADVIL,MOTRIN) 600 MG tablet, Take by mouth every 6 (six) hours as needed. , Disp: , Rfl:  .  Krill Oil 300 MG CAPS, Take 300 mg by mouth daily., Disp: , Rfl:  .  levothyroxine (SYNTHROID, LEVOTHROID) 75 MCG tablet,  TAKE ONE TABLET EVERY DAY, Disp: 90 tablet, Rfl: 12 .  Multiple Vitamins-Minerals (PRESERVISION AREDS 2 PO), Take 2 tablets by mouth daily., Disp: , Rfl:  .  simvastatin (ZOCOR) 10 MG tablet, TAKE ONE TABLET BY MOUTH EVERY EVENING, Disp: 90 tablet, Rfl: 4 .  valsartan-hydrochlorothiazide (DIOVAN-HCT) 320-25 MG tablet, TAKE ONE TABLET EVERY DAY, Disp: 90 tablet, Rfl: 4   Patient Care Team: Birdie Sons, MD as PCP - General (Family Medicine)      Objective:   Vitals: BP (!) 160/78 (BP Location: Left Arm, Patient Position: Sitting, Cuff Size: Large)   Pulse 66   Temp 97.8 F (36.6 C) (Oral)   Resp 16   Ht 5\' 1"  (1.549 m)   Wt 162 lb (73.5 kg)   SpO2 96% Comment: room air  BMI 30.61 kg/m    Vitals:   08/06/16 0910 08/06/16 0933  BP: (!) 160/78 (!) 158/76  Pulse: 66   Resp: 16   Temp: 97.8 F (36.6 C)   TempSrc: Oral   SpO2: 96%   Weight: 162 lb (73.5 kg)   Height: 5\' 1"  (1.549 m)      Physical Exam   General Appearance:    Alert, cooperative, no distress,  appears stated age  Head:    Normocephalic, without obvious abnormality, atraumatic  Eyes:    PERRL, conjunctiva/corneas clear, EOM's intact, fundi    benign, both eyes  Ears:    Normal TM's and external ear canals, both ears  Nose:   Nares normal, septum midline, mucosa normal, no drainage    or sinus tenderness  Throat:   Lips, mucosa, and tongue normal; teeth and gums normal  Neck:   Supple, symmetrical, trachea midline, no adenopathy;    thyroid:  no enlargement/tenderness/nodules; no carotid   bruit or JVD  Back:     Symmetric, no curvature, ROM normal, no CVA tenderness  Lungs:     Clear to auscultation bilaterally, respirations unlabored  Chest Wall:    No tenderness or deformity   Heart:    Regular rate and rhythm, S1 and S2 normal, no murmur, rub   or gallop  Breast Exam:    deferred  Abdomen:     Soft, non-tender, bowel sounds active all four quadrants,    no masses, no organomegaly  Pelvic:    deferred  Extremities:   Extremities normal, atraumatic, no cyanosis or edema  Pulses:   2+ and symmetric all extremities  Skin:   Skin color, texture, turgor normal, no rashes or lesions  Lymph nodes:   Cervical, supraclavicular, and axillary nodes normal  Neurologic:   CNII-XII intact, normal strength, sensation and reflexes    throughout    Depression Screen PHQ 2/9 Scores 08/05/2016 01/23/2016 10/16/2014  PHQ - 2 Score 0 0 0      Assessment & Plan:     Routine Health Maintenance and Physical Exam  Exercise Activities and Dietary recommendations Goals    . Increase water intake          Recommend increasing water intake to 4 glasses a day.        Immunization History  Administered Date(s) Administered  . Influenza Split 01/22/2015  . Influenza, High Dose Seasonal PF 12/21/2015  . Pneumococcal Conjugate-13 04/10/2014  . Pneumococcal Polysaccharide-23 12/28/2008  . Tdap 04/29/2010  . Zoster 04/29/2010    Health Maintenance  Topic Date Due  . INFLUENZA  VACCINE  09/17/2016  . TETANUS/TDAP  04/28/2020  . DEXA SCAN  Completed  . PNA vac Low Risk Adult  Completed     Discussed health benefits of physical activity, and encouraged her to engage in regular exercise appropriate for her age and condition.    --------------------------------------------------------------------  1. Hypercholesterolemia without hypertriglyceridemia She is tolerating simvastatin well with no adverse effects.   - Lipid panel - Comprehensive metabolic panel  2. Essential hypertension2 BP elevated today. Work on avoiding salty foods and sodium.   - amLODipine (NORVASC) 5 MG tablet; Take one tablet every evening  Dispense: 1 tablet; Refill: 1  3. Hypothyroidism, unspecified type  - TSH  4. Osteopenia, unspecified location  - VITAMIN D 25 Hydroxy (Vit-D Deficiency, Fractures)  5. Chronic kidney disease (CKD), stage III (moderate)  - VITAMIN D 25 Hydroxy (Vit-D Deficiency, Fractures)  6. Prediabetes  - Hemoglobin A1c  7. Class 1 obesity without serious comorbidity with body mass index (BMI) of 30.0 to 30.9 in adult, unspecified obesity type   8. Anemia in stage 3 chronic kidney disease  - CBC  9. Estrogen deficiency  - DG Bone Density; Future    [name], MD  [facility]

## 2016-08-07 LAB — COMPREHENSIVE METABOLIC PANEL
A/G RATIO: 1.6 (ref 1.2–2.2)
ALK PHOS: 89 IU/L (ref 39–117)
ALT: 28 IU/L (ref 0–32)
AST: 34 IU/L (ref 0–40)
Albumin: 4.6 g/dL (ref 3.5–4.7)
BUN/Creatinine Ratio: 23 (ref 12–28)
BUN: 33 mg/dL — ABNORMAL HIGH (ref 8–27)
Bilirubin Total: 0.4 mg/dL (ref 0.0–1.2)
CO2: 26 mmol/L (ref 20–29)
Calcium: 10.2 mg/dL (ref 8.7–10.3)
Chloride: 100 mmol/L (ref 96–106)
Creatinine, Ser: 1.42 mg/dL — ABNORMAL HIGH (ref 0.57–1.00)
GFR calc Af Amer: 38 mL/min/{1.73_m2} — ABNORMAL LOW (ref >59)
GFR calc non Af Amer: 33 mL/min/{1.73_m2} — ABNORMAL LOW (ref >59)
GLOBULIN, TOTAL: 2.8 g/dL (ref 1.5–4.5)
Glucose: 105 mg/dL — ABNORMAL HIGH (ref 65–99)
POTASSIUM: 4.2 mmol/L (ref 3.5–5.2)
SODIUM: 141 mmol/L (ref 134–144)
Total Protein: 7.4 g/dL (ref 6.0–8.5)

## 2016-08-07 LAB — CBC
Hematocrit: 39.2 % (ref 34.0–46.6)
Hemoglobin: 13.1 g/dL (ref 11.1–15.9)
MCH: 30.1 pg (ref 26.6–33.0)
MCHC: 33.4 g/dL (ref 31.5–35.7)
MCV: 90 fL (ref 79–97)
Platelets: 134 10*3/uL — ABNORMAL LOW (ref 150–379)
RBC: 4.35 x10E6/uL (ref 3.77–5.28)
RDW: 14.5 % (ref 12.3–15.4)
WBC: 9 10*3/uL (ref 3.4–10.8)

## 2016-08-07 LAB — HEMOGLOBIN A1C
ESTIMATED AVERAGE GLUCOSE: 114 mg/dL
HEMOGLOBIN A1C: 5.6 % (ref 4.8–5.6)

## 2016-08-07 LAB — TSH: TSH: 2.5 u[IU]/mL (ref 0.450–4.500)

## 2016-08-07 LAB — LIPID PANEL
CHOLESTEROL TOTAL: 164 mg/dL (ref 100–199)
Chol/HDL Ratio: 3.8 ratio (ref 0.0–4.4)
HDL: 43 mg/dL (ref >39)
LDL Calculated: 88 mg/dL (ref 0–99)
Triglycerides: 163 mg/dL — ABNORMAL HIGH (ref 0–149)
VLDL Cholesterol Cal: 33 mg/dL (ref 5–40)

## 2016-08-07 LAB — VITAMIN D 25 HYDROXY (VIT D DEFICIENCY, FRACTURES): VIT D 25 HYDROXY: 31.9 ng/mL (ref 30.0–100.0)

## 2016-08-08 ENCOUNTER — Telehealth: Payer: Self-pay

## 2016-08-08 DIAGNOSIS — E7800 Pure hypercholesterolemia, unspecified: Secondary | ICD-10-CM

## 2016-08-08 MED ORDER — SIMVASTATIN 20 MG PO TABS: 20.0000 mg | ORAL_TABLET | Freq: Every day | ORAL | 0 refills | Status: DC

## 2016-08-08 NOTE — Telephone Encounter (Signed)
-----   Message from Birdie Sons, MD sent at 08/07/2016  9:52 AM EDT ----- ldl cholesterol is 88 and should be under 70. Rest of labs are normal. Increase simvastatin to 20mg  daily, #30, rf x 2. Recheck lipids in 6 weeks. Will call when time to recheck.

## 2016-08-08 NOTE — Telephone Encounter (Signed)
Patient and her daughter advised as below and agrees with treatment plan. Prescription sent into the pharmacy.

## 2016-08-11 ENCOUNTER — Ambulatory Visit: Payer: PPO | Admitting: Podiatry

## 2016-08-12 ENCOUNTER — Other Ambulatory Visit: Payer: Self-pay | Admitting: Family Medicine

## 2016-09-22 ENCOUNTER — Telehealth: Payer: Self-pay | Admitting: Family Medicine

## 2016-09-22 DIAGNOSIS — E7800 Pure hypercholesterolemia, unspecified: Secondary | ICD-10-CM

## 2016-09-22 NOTE — Telephone Encounter (Signed)
Advised daughter as below. Order for labs printed.

## 2016-09-22 NOTE — Telephone Encounter (Signed)
-----   Message from Birdie Sons, MD sent at 08/07/2016  9:51 AM EDT ----- Regarding: recheck lipids late july  Increased simvastatin 6-21

## 2016-09-22 NOTE — Telephone Encounter (Signed)
Please advise pt it is time to check lipids and met C since we increased simvastatin dose in June. Please leave order at front desk for her to pick up when fasting.

## 2016-09-24 DIAGNOSIS — E7800 Pure hypercholesterolemia, unspecified: Secondary | ICD-10-CM | POA: Diagnosis not present

## 2016-09-25 LAB — LIPID PANEL
CHOL/HDL RATIO: 3.4 ratio (ref 0.0–4.4)
CHOLESTEROL TOTAL: 139 mg/dL (ref 100–199)
HDL: 41 mg/dL (ref >39)
LDL Calculated: 77 mg/dL (ref 0–99)
TRIGLYCERIDES: 107 mg/dL (ref 0–149)
VLDL Cholesterol Cal: 21 mg/dL (ref 5–40)

## 2016-09-25 LAB — COMPREHENSIVE METABOLIC PANEL
A/G RATIO: 1.8 (ref 1.2–2.2)
ALBUMIN: 4.4 g/dL (ref 3.5–4.7)
ALT: 27 IU/L (ref 0–32)
AST: 28 IU/L (ref 0–40)
Alkaline Phosphatase: 79 IU/L (ref 39–117)
BUN / CREAT RATIO: 24 (ref 12–28)
BUN: 31 mg/dL — ABNORMAL HIGH (ref 8–27)
Bilirubin Total: 0.4 mg/dL (ref 0.0–1.2)
CALCIUM: 9.7 mg/dL (ref 8.7–10.3)
CHLORIDE: 101 mmol/L (ref 96–106)
CO2: 26 mmol/L (ref 20–29)
Creatinine, Ser: 1.3 mg/dL — ABNORMAL HIGH (ref 0.57–1.00)
GFR calc Af Amer: 43 mL/min/{1.73_m2} — ABNORMAL LOW (ref >59)
GFR, EST NON AFRICAN AMERICAN: 37 mL/min/{1.73_m2} — AB (ref >59)
Globulin, Total: 2.5 g/dL (ref 1.5–4.5)
Glucose: 103 mg/dL — ABNORMAL HIGH (ref 65–99)
POTASSIUM: 4 mmol/L (ref 3.5–5.2)
SODIUM: 139 mmol/L (ref 134–144)
TOTAL PROTEIN: 6.9 g/dL (ref 6.0–8.5)

## 2016-10-15 ENCOUNTER — Ambulatory Visit
Admission: RE | Admit: 2016-10-15 | Discharge: 2016-10-15 | Disposition: A | Discharge status: Home / Self Care | Payer: PPO | Source: Ambulatory Visit | Attending: Family Medicine | Admitting: Family Medicine

## 2016-10-15 DIAGNOSIS — M8588 Other specified disorders of bone density and structure, other site: Secondary | ICD-10-CM | POA: Diagnosis not present

## 2016-10-15 DIAGNOSIS — E2839 Other primary ovarian failure: Secondary | ICD-10-CM

## 2016-10-15 DIAGNOSIS — M8589 Other specified disorders of bone density and structure, multiple sites: Secondary | ICD-10-CM | POA: Diagnosis not present

## 2016-10-15 DIAGNOSIS — Z780 Asymptomatic menopausal state: Secondary | ICD-10-CM | POA: Diagnosis not present

## 2016-10-16 ENCOUNTER — Telehealth: Payer: Self-pay

## 2016-10-16 NOTE — Telephone Encounter (Signed)
Pt advised.   Thanks,   -Laura  

## 2016-10-16 NOTE — Telephone Encounter (Signed)
-----   Message from Birdie Sons, MD sent at 10/15/2016  1:53 PM EDT ----- Bone density is stable. Repeat in 3 years.

## 2016-12-11 ENCOUNTER — Other Ambulatory Visit: Payer: Self-pay | Admitting: Family Medicine

## 2016-12-11 DIAGNOSIS — E7800 Pure hypercholesterolemia, unspecified: Secondary | ICD-10-CM

## 2016-12-22 ENCOUNTER — Other Ambulatory Visit: Payer: Self-pay | Admitting: Family Medicine

## 2016-12-22 DIAGNOSIS — I10 Essential (primary) hypertension: Secondary | ICD-10-CM

## 2016-12-22 NOTE — Telephone Encounter (Signed)
Pharmacy requesting refills. Thanks!  

## 2016-12-24 ENCOUNTER — Other Ambulatory Visit: Payer: Self-pay | Admitting: Family Medicine

## 2017-01-15 DIAGNOSIS — H353211 Exudative age-related macular degeneration, right eye, with active choroidal neovascularization: Secondary | ICD-10-CM | POA: Diagnosis not present

## 2017-07-17 DIAGNOSIS — H353122 Nonexudative age-related macular degeneration, left eye, intermediate dry stage: Secondary | ICD-10-CM | POA: Diagnosis not present

## 2017-07-28 ENCOUNTER — Encounter: Payer: Self-pay | Admitting: Family Medicine

## 2017-07-28 ENCOUNTER — Ambulatory Visit (INDEPENDENT_AMBULATORY_CARE_PROVIDER_SITE_OTHER): Payer: PPO | Admitting: Family Medicine

## 2017-07-28 VITALS — BP 158/70 | HR 64 | Temp 97.9°F | Resp 16 | Wt 157.0 lb

## 2017-07-28 DIAGNOSIS — I2510 Atherosclerotic heart disease of native coronary artery without angina pectoris: Secondary | ICD-10-CM

## 2017-07-28 DIAGNOSIS — E039 Hypothyroidism, unspecified: Secondary | ICD-10-CM | POA: Diagnosis not present

## 2017-07-28 DIAGNOSIS — M8580 Other specified disorders of bone density and structure, unspecified site: Secondary | ICD-10-CM

## 2017-07-28 DIAGNOSIS — I10 Essential (primary) hypertension: Secondary | ICD-10-CM

## 2017-07-28 NOTE — Progress Notes (Signed)
Patient: [name] [name] Female    DOB: 16-Feb-1927   84 y.o.   MRN: [id number] Visit Date: 07/28/2017  Today's Provider: [name], MD   Chief Complaint  Patient presents with  . Hypertension  . Hyperlipidemia  . Hypothyroidism  . Chronic Kidney Disease  . Hyperglycemia   Subjective:    HPI   Hypertension, follow-up:  BP Readings from Last 3 Encounters:  07/28/17 (!) 162/72  08/06/16 (!) 158/76  08/05/16 (!) 160/80    She was last seen for hypertension 1 years ago.  BP at that visit was 160/78. Management since that visit includes; advised to work on avoiding salty foods and sodium.She reports good compliance with treatment. She is not having side effects.  She is exercising. She is adherent to low salt diet.   Outside blood pressures are not being checked. She is experiencing dyspnea and lower extremity edema.  Patient denies chest pain, chest pressure/discomfort, claudication, exertional chest pressure/discomfort, irregular heart beat, near-syncope, orthopnea, palpitations, paroxysmal nocturnal dyspnea, syncope and tachypnea.   Cardiovascular risk factors include advanced age (older than 79 for men, 56 for women), dyslipidemia and hypertension.  Use of agents associated with hypertension: NSAIDS.   ------------------------------------------------------------------------    Lipid/Cholesterol, Follow-up:   Last seen for this 1 years ago.  Management since that visit includes; labs checked. Increased simvastatin to 20 mg qd.  Last Lipid Panel:    Component Value Date/Time   CHOL 139 09/24/2016 0845   TRIG 107 09/24/2016 0845   HDL 41 09/24/2016 0845   CHOLHDL 3.4 09/24/2016 0845   LDLCALC 77 09/24/2016 0845    She reports good compliance with treatment. She is not having side effects.   Wt Readings from Last 3 Encounters:  07/28/17 157 lb (71.2 kg)  08/06/16 162 lb (73.5 kg)  08/05/16 164 lb 6.4 oz (74.6 kg)     ------------------------------------------------------------------------  Hypothyroidism, unspecified type From 08/06/2016-labs checked, no changes. Patient reports good compliance with treatment, and good tolerance.  Lab Results  Component Value Date   TSH 2.500 08/06/2016     Chronic kidney disease (CKD), stage III (moderate) From 08/06/2016-labs checked, no changes.  BMET    Component Value Date/Time   NA 139 09/24/2016 0845   K 4.0 09/24/2016 0845   CL 101 09/24/2016 0845   CO2 26 09/24/2016 0845   GLUCOSE 103 (H) 09/24/2016 0845   GLUCOSE 134 (H) 12/03/2006 1345   BUN 31 (H) 09/24/2016 0845   CREATININE 1.30 (H) 09/24/2016 0845   CALCIUM 9.7 09/24/2016 0845   GFRNONAA 37 (L) 09/24/2016 0845   GFRAA 43 (L) 09/24/2016 0845     Prediabetes From 08/06/2016-labs checked, no changes. Hemoglobin A1c 5.6. Patient reports she has tried cutting back on starchy foods.     Allergies  Allergen Reactions  . Atorvastatin     myalgias/arthralgias.  . Lovastatin     Other reaction(s): Muscle Pain     Current Outpatient Medications:  .  allopurinol (ZYLOPRIM) 100 MG tablet, TAKE TWO TABLETS BY MOUTH EVERY DAY, Disp: 180 tablet, Rfl: 4 .  amLODipine (NORVASC) 5 MG tablet, TAKE 1 TABLET BY  MOUTH DAILY, Disp: 90 tablet, Rfl: 4 .  aspirin 81 MG tablet, Take 81 mg by mouth daily. , Disp: , Rfl:  .  CALCIUM CARBONATE-VITAMIN D PO, Take 2 tablets by mouth daily. , Disp: , Rfl:  .  GARLIC PO, Take 5 mg by mouth daily., Disp: , Rfl:  .  ibuprofen (ADVIL,MOTRIN) 600 MG tablet, Take by mouth every 6 (six) hours as needed. , Disp: , Rfl:  .  Krill Oil 300 MG CAPS, Take 300 mg by mouth daily., Disp: , Rfl:  .  levothyroxine (SYNTHROID, LEVOTHROID) 75 MCG tablet, TAKE ONE TABLET BY MOUTH EVERY DAY, Disp: 90 tablet, Rfl: 4 .  Multiple Vitamins-Minerals (PRESERVISION AREDS 2 PO), Take 2 tablets by mouth daily., Disp: , Rfl:  .  simvastatin (ZOCOR) 20 MG tablet, TAKE ONE TABLET EVERY  DAY, Disp: 90 tablet, Rfl: 4 .  valsartan-hydrochlorothiazide (DIOVAN-HCT) 320-25 MG tablet, TAKE ONE TABLET EVERY DAY, Disp: 90 tablet, Rfl: 4  Review of Systems  Constitutional: Negative for appetite change, chills, fatigue and fever.  Respiratory: Positive for shortness of breath. Negative for chest tightness.   Cardiovascular: Positive for leg swelling. Negative for chest pain and palpitations.  Gastrointestinal: Negative for abdominal pain, nausea and vomiting.  Neurological: Negative for dizziness and weakness.    Social History   Tobacco Use  . Smoking status: Former Smoker    Types: Cigarettes    Last attempt to quit: 02/18/2000    Years since quitting: 17.4  . Smokeless tobacco: Never Used  Substance Use Topics  . Alcohol use: No    Alcohol/week: 0.0 oz   Objective:   BP (!) 162/72 (BP Location: Right Arm, Cuff Size: Normal)   Pulse 64   Temp 97.9 F (36.6 C) (Oral)   Resp 16   Wt 157 lb (71.2 kg)   SpO2 97% Comment: room air  BMI 29.66 kg/m  Vitals:   07/28/17 0844 07/28/17 0851  BP: (!) 164/70 (!) 162/72  Pulse: 64   Resp: 16   Temp: 97.9 F (36.6 C)   TempSrc: Oral   SpO2: 97%   Weight: 157 lb (71.2 kg)      Physical Exam  General Appearance:    Alert, cooperative, no distress  Eyes:    PERRL, conjunctiva/corneas clear, EOM's intact       Lungs:     Clear to auscultation bilaterally, respirations unlabored  Heart:    Regular rate and rhythm, 1+ left ankle edema, trace right ankle edema.   Neurologic:   Awake, alert, oriented x 3. No apparent focal neurological           defect.          Assessment & Plan:     1. Essential hypertension Systolic not to goal. Check labs. Consider increasing amlodipine or adding spironolactone.   2. CAD in native artery Asymptomatic. Compliant with medication.  Continue aggressive risk factor modification.   - Comprehensive metabolic panel - Lipid panel  3. Hypothyroidism, unspecified type  - TSH  4.  Osteopenia, unspecified location BMD due in 2020.  - VITAMIN D 25 Hydroxy (Vit-D Deficiency, Fractures)       [name], MD  [facility]

## 2017-07-29 ENCOUNTER — Telehealth: Payer: Self-pay | Admitting: *Deleted

## 2017-07-29 LAB — COMPREHENSIVE METABOLIC PANEL
A/G RATIO: 1.8 (ref 1.2–2.2)
ALK PHOS: 71 IU/L (ref 39–117)
ALT: 24 IU/L (ref 0–32)
AST: 29 IU/L (ref 0–40)
Albumin: 4.4 g/dL (ref 3.5–4.7)
BILIRUBIN TOTAL: 0.4 mg/dL (ref 0.0–1.2)
BUN / CREAT RATIO: 26 (ref 12–28)
BUN: 37 mg/dL — ABNORMAL HIGH (ref 8–27)
CHLORIDE: 101 mmol/L (ref 96–106)
CO2: 25 mmol/L (ref 20–29)
Calcium: 9.9 mg/dL (ref 8.7–10.3)
Creatinine, Ser: 1.43 mg/dL — ABNORMAL HIGH (ref 0.57–1.00)
GFR calc non Af Amer: 33 mL/min/{1.73_m2} — ABNORMAL LOW (ref >59)
GFR, EST AFRICAN AMERICAN: 38 mL/min/{1.73_m2} — AB (ref >59)
GLUCOSE: 98 mg/dL (ref 65–99)
Globulin, Total: 2.5 g/dL (ref 1.5–4.5)
POTASSIUM: 4.1 mmol/L (ref 3.5–5.2)
SODIUM: 140 mmol/L (ref 134–144)
TOTAL PROTEIN: 6.9 g/dL (ref 6.0–8.5)

## 2017-07-29 LAB — LIPID PANEL
CHOLESTEROL TOTAL: 142 mg/dL (ref 100–199)
Chol/HDL Ratio: 3.1 ratio (ref 0.0–4.4)
HDL: 46 mg/dL (ref >39)
LDL Calculated: 77 mg/dL (ref 0–99)
Triglycerides: 95 mg/dL (ref 0–149)
VLDL Cholesterol Cal: 19 mg/dL (ref 5–40)

## 2017-07-29 LAB — TSH: TSH: 1.59 u[IU]/mL (ref 0.450–4.500)

## 2017-07-29 LAB — VITAMIN D 25 HYDROXY (VIT D DEFICIENCY, FRACTURES): VIT D 25 HYDROXY: 39.1 ng/mL (ref 30.0–100.0)

## 2017-07-29 MED ORDER — AMLODIPINE BESYLATE 10 MG PO TABS: 10.0000 mg | ORAL_TABLET | Freq: Every day | ORAL | 0 refills | Status: DC

## 2017-07-29 NOTE — Telephone Encounter (Signed)
-----   Message from Birdie Sons, MD sent at 07/29/2017  4:06 PM EDT ----- Also, I forgot to mention we wanted her to increase amlodipine to 10mg  daily because her blood pressure has been running high, can send in prescription for #90 and schedule follow up in 3 months for BP.

## 2017-07-29 NOTE — Telephone Encounter (Signed)
Patient's daughter was advised. Rx sent to pharmacy.

## 2017-07-30 ENCOUNTER — Telehealth: Payer: Self-pay

## 2017-07-30 NOTE — Telephone Encounter (Signed)
Called pt to schedule AWV and pt stated that she would like her daughter to schedule this apt for her. Pt to have daughter CB. Wellness due any after 08/05/17. -MM

## 2017-07-30 NOTE — Telephone Encounter (Signed)
The patient's daughter Edmonia Lynch called and said she would like to talk to someone about her results.  She said someone dcalled her mother but she did not understand anything.  Thanks

## 2017-07-31 NOTE — Telephone Encounter (Signed)
Per daughter - pt declines having an AWV this year.  -MM

## 2017-08-19 ENCOUNTER — Other Ambulatory Visit: Payer: Self-pay | Admitting: Family Medicine

## 2017-09-21 ENCOUNTER — Other Ambulatory Visit: Payer: Self-pay | Admitting: Family Medicine

## 2017-11-30 ENCOUNTER — Encounter: Payer: Self-pay | Admitting: Family Medicine

## 2017-11-30 ENCOUNTER — Ambulatory Visit (INDEPENDENT_AMBULATORY_CARE_PROVIDER_SITE_OTHER): Payer: PPO | Admitting: Family Medicine

## 2017-11-30 VITALS — BP 128/66 | HR 72 | Temp 97.7°F | Resp 16 | Wt 157.0 lb

## 2017-11-30 DIAGNOSIS — Z23 Encounter for immunization: Secondary | ICD-10-CM | POA: Diagnosis not present

## 2017-11-30 DIAGNOSIS — R609 Edema, unspecified: Secondary | ICD-10-CM

## 2017-11-30 DIAGNOSIS — I10 Essential (primary) hypertension: Secondary | ICD-10-CM | POA: Diagnosis not present

## 2017-11-30 NOTE — Patient Instructions (Addendum)
   You can get a medical alert device in order to call for help in case of emergencies. MedAlert devices are available from [facility]. Call [phone] for information on how to order this device.

## 2017-11-30 NOTE — Progress Notes (Signed)
Patient: [name] [name] Female    DOB: 09/06/1926   84 y.o.   MRN: [id number] Visit Date: 11/30/2017  Today's Provider: [name], MD   Chief Complaint  Patient presents with  . Hypertension  . Hypothyroidism  . Hyperlipidemia   Subjective:    Hypertension  This is a chronic (07/28/2017  Amlodipin was increased to 10mg  a day.  Last BP was 164/70.) problem. The problem has been gradually improving since onset. The problem is controlled (Home BPs are 130's/70's). Associated symptoms include peripheral edema. Pertinent negatives include no anxiety, blurred vision, chest pain, headaches, malaise/fatigue, neck pain, orthopnea, palpitations, shortness of breath or sweats. There are no associated agents to hypertension. There are no compliance problems.  Identifiable causes of hypertension include a thyroid problem.  Thyroid Problem  Presents for follow-up visit. Symptoms include constipation and leg swelling. Patient reports no anxiety, cold intolerance, diaphoresis, diarrhea, fatigue, heat intolerance, hoarse voice, nail problem, palpitations, visual change, weight gain or weight loss. The symptoms have been stable. Her past medical history is significant for hyperlipidemia.  Hyperlipidemia  This is a chronic problem. The problem is controlled. Recent lipid tests were reviewed and are normal. Pertinent negatives include no chest pain or shortness of breath. Current antihyperlipidemic treatment includes statins. There are no compliance problems.       BP Readings from Last 3 Encounters:  11/30/17 128/66  07/28/17 (!) 158/70  08/06/16 (!) 158/76      No results found for: LDLDIRECT Lab Results  Component Value Date   TSH 1.590 07/28/2017       Allergies  Allergen Reactions  . Atorvastatin     myalgias/arthralgias.  . Lovastatin     Other reaction(s): Muscle Pain     Current Outpatient Medications:  .  allopurinol (ZYLOPRIM) 100 MG tablet, TAKE TWO TABLETS BY  MOUTH EVERY DAY, Disp: 180 tablet, Rfl: 4 .  amLODipine (NORVASC) 10 MG tablet, TAKE 1 TABLET BY MOUTH DAILY, Disp: 90 tablet, Rfl: 4 .  aspirin 81 MG tablet, Take 81 mg by mouth daily. , Disp: , Rfl:  .  CALCIUM CARBONATE-VITAMIN D PO, Take 2 tablets by mouth daily. , Disp: , Rfl:  .  GARLIC PO, Take 5 mg by mouth daily., Disp: , Rfl:  .  ibuprofen (ADVIL,MOTRIN) 600 MG tablet, Take by mouth every 6 (six) hours as needed. , Disp: , Rfl:  .  Krill Oil 300 MG CAPS, Take 300 mg by mouth daily., Disp: , Rfl:  .  levothyroxine (SYNTHROID, LEVOTHROID) 75 MCG tablet, TAKE 1 TABLET EVERY DAY ON EMPTY STOMACHWITH A GLASS OF WATER AT LEAST 30-60 MINBEFORE BREAKFAST, Disp: 90 tablet, Rfl: 4 .  Multiple Vitamins-Minerals (PRESERVISION AREDS 2 PO), Take 2 tablets by mouth daily., Disp: , Rfl:  .  simvastatin (ZOCOR) 20 MG tablet, TAKE ONE TABLET EVERY DAY, Disp: 90 tablet, Rfl: 4 .  valsartan-hydrochlorothiazide (DIOVAN-HCT) 320-25 MG tablet, TAKE ONE TABLET EVERY DAY, Disp: 90 tablet, Rfl: 4  Review of Systems  Constitutional: Negative.  Negative for diaphoresis, fatigue, malaise/fatigue, weight gain and weight loss.  HENT: Negative for hoarse voice.   Eyes: Negative for blurred vision.  Respiratory: Negative.  Negative for shortness of breath.   Cardiovascular: Positive for leg swelling (Left worse than right.). Negative for chest pain, palpitations and orthopnea.  Gastrointestinal: Positive for constipation. Negative for abdominal distention, abdominal pain, anal bleeding, blood in stool, diarrhea, nausea and rectal pain.  Endocrine: Negative for cold intolerance  and heat intolerance.  Musculoskeletal: Negative for neck pain.  Neurological: Negative for dizziness, light-headedness and headaches.  Psychiatric/Behavioral: The patient is not nervous/anxious.     Social History   Tobacco Use  . Smoking status: Former Smoker    Types: Cigarettes    Last attempt to quit: 02/18/2000    Years since  quitting: 17.7  . Smokeless tobacco: Never Used  Substance Use Topics  . Alcohol use: No    Alcohol/week: 0.0 standard drinks   Objective:   BP 128/66 (BP Location: Left Arm, Patient Position: Sitting, Cuff Size: Normal)   Pulse 72   Temp 97.7 F (36.5 C) (Oral)   Resp 16   Wt 157 lb (71.2 kg)   BMI 29.66 kg/m  Vitals:   11/30/17 1116  BP: 128/66  Pulse: 72  Resp: 16  Temp: 97.7 F (36.5 C)  TempSrc: Oral  Weight: 157 lb (71.2 kg)     Physical Exam   General Appearance:    Alert, cooperative, no distress  Eyes:    PERRL, conjunctiva/corneas clear, EOM's intact       Lungs:     Clear to auscultation bilaterally, respirations unlabored  Heart:    Regular rate and rhythm. III/VI bilateral lower leg edema.   Neurologic:   Awake, alert, oriented x 3. No apparent focal neurological           defect.           Assessment & Plan:     1. Essential hypertension Well controlled.  Continue current medications.    2. Edema, unspecified type Likely aggravated by CCB. Continue current medications.  Keep legs elevated when not ambulating.   3. Need for influenza vaccination  - Flu vaccine HIGH DOSE PF (Fluzone High dose)       [name], MD  [facility]

## 2017-12-15 ENCOUNTER — Other Ambulatory Visit: Payer: Self-pay | Admitting: Family Medicine

## 2017-12-15 DIAGNOSIS — E7800 Pure hypercholesterolemia, unspecified: Secondary | ICD-10-CM

## 2018-01-18 DIAGNOSIS — H353122 Nonexudative age-related macular degeneration, left eye, intermediate dry stage: Secondary | ICD-10-CM | POA: Diagnosis not present

## 2018-03-22 ENCOUNTER — Other Ambulatory Visit: Payer: Self-pay

## 2018-03-22 NOTE — Patient Outreach (Signed)
  [facility]) Care Management  03/22/2018  [name] [name] 1926-06-22 [id number]   Medication Adherence call to [name] [name] patient did not answer and could not leave a message, patient is due on Valsartan/HCTZ 320/25 mg [facility] said patient has been picking up on a regular basis and has not miss a month.   [facility] Direct Dial [phone]  Fax [phone] [name]

## 2018-04-02 ENCOUNTER — Ambulatory Visit: Payer: PPO | Admitting: Family Medicine

## 2018-04-05 ENCOUNTER — Encounter: Payer: Self-pay | Admitting: Family Medicine

## 2018-04-05 ENCOUNTER — Other Ambulatory Visit: Payer: Self-pay

## 2018-04-05 ENCOUNTER — Ambulatory Visit (INDEPENDENT_AMBULATORY_CARE_PROVIDER_SITE_OTHER): Payer: PPO | Admitting: Family Medicine

## 2018-04-05 VITALS — BP 148/68 | HR 82 | Ht 61.0 in | Wt 155.0 lb

## 2018-04-05 DIAGNOSIS — E7800 Pure hypercholesterolemia, unspecified: Secondary | ICD-10-CM

## 2018-04-05 DIAGNOSIS — I83893 Varicose veins of bilateral lower extremities with other complications: Secondary | ICD-10-CM

## 2018-04-05 DIAGNOSIS — I10 Essential (primary) hypertension: Secondary | ICD-10-CM | POA: Diagnosis not present

## 2018-04-05 MED ORDER — AMLODIPINE BESYLATE 10 MG PO TABS: 10.0000 mg | ORAL_TABLET | Freq: Every evening | ORAL | 3 refills | Status: DC

## 2018-04-05 MED ORDER — SIMVASTATIN 20 MG PO TABS: 20.0000 mg | ORAL_TABLET | Freq: Every evening | ORAL | 4 refills | Status: DC

## 2018-04-05 NOTE — Progress Notes (Signed)
Patient: [name] [name] Female    DOB: Jul 28, 1926   85 y.o.   MRN: [id number] Visit Date: 04/05/2018  Today's Provider: [name], MD   Chief Complaint  Patient presents with  . Hypertension    4 month fup  . Edema    both legs   Subjective:     HPI   Hypertension, follow-up:  BP Readings from Last 3 Encounters:  04/05/18 (!) 148/68  11/30/17 128/66  07/28/17 (!) 158/70    She was last seen for hypertension 4 months ago.  BP at that visit was 128/66. Prior to that visit  amlodipine was increased on 07/28/17 to 10 mg daily. She reports good compliance with treatment. She is not having side effects.  She is not exercising. She is adherent to low salt diet.   Outside blood pressures are 128/78 She is experiencing none.  Patient denies none.   Cardiovascular risk factors include none.  Use of agents associated with hypertension: none.     Weight trend: fluctuating a bit Wt Readings from Last 3 Encounters:  04/05/18 155 lb (70.3 kg)  11/30/17 157 lb (71.2 kg)  07/28/17 157 lb (71.2 kg)    Current diet: low salt  ------------------------------------------------------------------------   Allergies  Allergen Reactions  . Atorvastatin     myalgias/arthralgias.  . Lovastatin     Other reaction(s): Muscle Pain     Current Outpatient Medications:  .  allopurinol (ZYLOPRIM) 100 MG tablet, TAKE TWO TABLETS EVERY DAY, Disp: 180 tablet, Rfl: 4 .  amLODipine (NORVASC) 10 MG tablet, TAKE 1 TABLET BY MOUTH DAILY, Disp: 90 tablet, Rfl: 4 .  aspirin 81 MG tablet, Take 81 mg by mouth daily. , Disp: , Rfl:  .  CALCIUM CARBONATE-VITAMIN D PO, Take 2 tablets by mouth daily. , Disp: , Rfl:  .  GARLIC PO, Take 5 mg by mouth daily., Disp: , Rfl:  .  ibuprofen (ADVIL,MOTRIN) 600 MG tablet, Take by mouth every 6 (six) hours as needed. , Disp: , Rfl:  .  Krill Oil 300 MG CAPS, Take 300 mg by mouth daily., Disp: , Rfl:  .  levothyroxine (SYNTHROID, LEVOTHROID) 75 MCG  tablet, TAKE 1 TABLET EVERY DAY ON EMPTY STOMACHWITH A GLASS OF WATER AT LEAST 30-60 MINBEFORE BREAKFAST, Disp: 90 tablet, Rfl: 4 .  Multiple Vitamins-Minerals (PRESERVISION AREDS 2 PO), Take 2 tablets by mouth daily., Disp: , Rfl:  .  simvastatin (ZOCOR) 20 MG tablet, TAKE ONE TABLET EVERY DAY, Disp: 90 tablet, Rfl: 4 .  valsartan-hydrochlorothiazide (DIOVAN-HCT) 320-25 MG tablet, TAKE ONE TABLET EVERY DAY, Disp: 90 tablet, Rfl: 4  Review of Systems  Constitutional: Negative.   HENT: Negative.   Eyes: Negative.   Respiratory: Negative.   Cardiovascular: Positive for leg swelling.  Gastrointestinal: Negative.   Endocrine: Negative.   Genitourinary: Negative.   Musculoskeletal: Negative.   Skin: Negative.   Allergic/Immunologic: Negative.   Neurological: Negative.   Hematological: Negative.   Psychiatric/Behavioral: Negative.     Social History   Tobacco Use  . Smoking status: Former Smoker    Types: Cigarettes    Last attempt to quit: 02/18/2000    Years since quitting: 18.1  . Smokeless tobacco: Never Used  Substance Use Topics  . Alcohol use: No    Alcohol/week: 0.0 standard drinks      Objective:   BP (!) 148/68 (BP Location: Right Arm, Patient Position: Sitting, Cuff Size: Normal)   Pulse 82   Ht  5\' 1"  (1.549 m)   Wt 155 lb (70.3 kg)   SpO2 99%   BMI 29.29 kg/m  Vitals:   04/05/18 0850  BP: (!) 148/68  Pulse: 82  SpO2: 99%  Weight: 155 lb (70.3 kg)  Height: 5\' 1"  (1.549 m)     Physical Exam   General Appearance:    Alert, cooperative, no distress  Eyes:    PERRL, conjunctiva/corneas clear, EOM's intact       Lungs:     Clear to auscultation bilaterally, respirations unlabored  Heart:    Regular rate and rhythm  Neurologic:   Awake, alert, oriented x 3. No apparent focal neurological           defect.   Ext:   3+ RLE edema, 2+ LLE edema with moderate to severe varicosities.       Assessment & Plan    1. Essential hypertension Home BP mostly  controlled. Doing well current medications.   2. Varicose veins of both legs with edema Counseled regarding leg elevattion and encouraged to wear support stockings.   Follow up 4 months labs and BP check.      [name], MD  [facility]

## 2018-04-05 NOTE — Patient Instructions (Addendum)
.   Please review the attached list of medications and notify my office if there are any errors.   . Please bring all of your medications to every appointment so we can make sure that our medication list is the same as yours.    Take the amlodipine with your evening medications   Wear support stockings or compression stockings throughout the day

## 2018-07-19 DIAGNOSIS — H353122 Nonexudative age-related macular degeneration, left eye, intermediate dry stage: Secondary | ICD-10-CM | POA: Diagnosis not present

## 2018-08-04 ENCOUNTER — Ambulatory Visit: Payer: Self-pay | Admitting: Family Medicine

## 2018-08-11 ENCOUNTER — Ambulatory Visit (INDEPENDENT_AMBULATORY_CARE_PROVIDER_SITE_OTHER): Payer: PPO | Admitting: Family Medicine

## 2018-08-11 ENCOUNTER — Encounter: Payer: Self-pay | Admitting: Family Medicine

## 2018-08-11 ENCOUNTER — Other Ambulatory Visit: Payer: Self-pay

## 2018-08-11 VITALS — BP 156/75 | HR 80 | Temp 98.1°F | Wt 150.0 lb

## 2018-08-11 DIAGNOSIS — E039 Hypothyroidism, unspecified: Secondary | ICD-10-CM

## 2018-08-11 DIAGNOSIS — N183 Chronic kidney disease, stage 3 unspecified: Secondary | ICD-10-CM

## 2018-08-11 DIAGNOSIS — I10 Essential (primary) hypertension: Secondary | ICD-10-CM

## 2018-08-11 DIAGNOSIS — E7800 Pure hypercholesterolemia, unspecified: Secondary | ICD-10-CM

## 2018-08-11 DIAGNOSIS — R7303 Prediabetes: Secondary | ICD-10-CM

## 2018-08-11 DIAGNOSIS — K5900 Constipation, unspecified: Secondary | ICD-10-CM | POA: Diagnosis not present

## 2018-08-11 MED ORDER — DOCUSATE SODIUM 100 MG PO CAPS: 100.0000 mg | ORAL_CAPSULE | Freq: Every evening | ORAL | Status: DC

## 2018-08-11 NOTE — Progress Notes (Signed)
Patient: [name] [name] Female    DOB: May 31, 1926   85 y.o.   MRN: [id number] Visit Date: 08/11/2018  Today's Provider: [name], MD   Chief Complaint  Patient presents with  . Hypertension  . Hypothyroidism   Subjective:     HPI     Hypertension, follow-up:  BP Readings from Last 3 Encounters:  08/11/18 (!) 156/75  04/05/18 (!) 148/68  11/30/17 128/66    She was last seen for hypertension 6 months ago.  BP at that visit was 148/68. Management since that visit includes No changes. She reports excellent compliance with treatment. She is not having side effects.  She is exercising. She is adherent to low salt diet.   Outside blood pressures are not being checked, but had been consistently in the 120s and 130s before her daughter stopped checking in several months ago.  She is experiencing none.  Patient denies chest pain, dyspnea, fatigue and palpitations.   Cardiovascular risk factors include advanced age (older than 48 for men, 41 for women), dyslipidemia and hypertension.  Use of agents associated with hypertension: none.     Weight trend: stable Wt Readings from Last 3 Encounters:  08/11/18 150 lb (68 kg)  04/05/18 155 lb (70.3 kg)  11/30/17 157 lb (71.2 kg)  Has not taken amlodipine yet this morning.   Current diet: in general, a "healthy" diet     Hypothyroid, follow-up:  TSH  Date Value Ref Range Status  07/28/2017 1.590 0.450 - 4.500 uIU/mL Final  08/06/2016 2.500 0.450 - 4.500 uIU/mL Final  07/26/2015 2.450 0.450 - 4.500 uIU/mL Final   Wt Readings from Last 3 Encounters:  08/11/18 150 lb (68 kg)  04/05/18 155 lb (70.3 kg)  11/30/17 157 lb (71.2 kg)    She was last seen for hypothyroid 1 years ago.  Management since that visit includes No changes. She reports excellent compliance with treatment. She is not having side effects.  She is exercising. She is experiencing Constipation She denies nervousness and palpitations Weight  trend: stable  ------------------------------------------------------------------------  She also reports that she has been having trouble with constipation. She takes occasional docusate which is effective, but is reluctant to take it every day.   Allergies  Allergen Reactions  . Atorvastatin     myalgias/arthralgias.  . Lovastatin     Other reaction(s): Muscle Pain     Current Outpatient Medications:  .  allopurinol (ZYLOPRIM) 100 MG tablet, TAKE TWO TABLETS EVERY DAY, Disp: 180 tablet, Rfl: 4 .  amLODipine (NORVASC) 10 MG tablet, Take 1 tablet (10 mg total) by mouth every evening., Disp: 3 tablet, Rfl: 3 .  CALCIUM CARBONATE-VITAMIN D PO, Take 2 tablets by mouth daily. , Disp: , Rfl:  .  GARLIC PO, Take 5 mg by mouth daily., Disp: , Rfl:  .  levothyroxine (SYNTHROID, LEVOTHROID) 75 MCG tablet, TAKE 1 TABLET EVERY DAY ON EMPTY STOMACHWITH A GLASS OF WATER AT LEAST 30-60 MINBEFORE BREAKFAST, Disp: 90 tablet, Rfl: 4 .  Multiple Vitamins-Minerals (PRESERVISION AREDS 2 PO), Take 2 tablets by mouth daily., Disp: , Rfl:  .  naproxen sodium (ALEVE) 220 MG tablet, Take 220 mg by mouth., Disp: , Rfl:  .  simvastatin (ZOCOR) 20 MG tablet, Take 1 tablet (20 mg total) by mouth every evening., Disp: 3 tablet, Rfl: 4 .  valsartan-hydrochlorothiazide (DIOVAN-HCT) 320-25 MG tablet, TAKE ONE TABLET EVERY DAY, Disp: 90 tablet, Rfl: 4 .  aspirin 81 MG tablet, Take 81  mg by mouth daily. , Disp: , Rfl:  .  ibuprofen (ADVIL,MOTRIN) 600 MG tablet, Take by mouth every 6 (six) hours as needed. , Disp: , Rfl:  .  Krill Oil 300 MG CAPS, Take 300 mg by mouth daily., Disp: , Rfl:   Review of Systems  Constitutional: Negative.   Respiratory: Negative.   Cardiovascular: Positive for leg swelling. Negative for chest pain and palpitations.  Gastrointestinal: Positive for constipation. Negative for abdominal distention, abdominal pain, anal bleeding, blood in stool, diarrhea, nausea, rectal pain and vomiting.   Neurological: Negative for dizziness, light-headedness and headaches.    Social History   Tobacco Use  . Smoking status: Former Smoker    Types: Cigarettes    Quit date: 02/18/2000    Years since quitting: 18.4  . Smokeless tobacco: Never Used  Substance Use Topics  . Alcohol use: No    Alcohol/week: 0.0 standard drinks      Objective:   BP (!) 156/75 (BP Location: Right Arm, Patient Position: Sitting, Cuff Size: Large)   Pulse 80   Temp 98.1 F (36.7 C) (Oral)   Wt 150 lb (68 kg)   BMI 28.34 kg/m  Vitals:   08/11/18 0849  BP: (!) 156/75  Pulse: 80  Temp: 98.1 F (36.7 C)  TempSrc: Oral  Weight: 150 lb (68 kg)     Physical Exam   General Appearance:    Alert, cooperative, no distress  Eyes:    PERRL, conjunctiva/corneas clear, EOM's intact       Lungs:     Clear to auscultation bilaterally, respirations unlabored  Heart:    Regular rate and rhythm. 2+ bipedal and ankle edema.   Neurologic:   Awake, alert, oriented x 3. No apparent focal neurological           defect.           Assessment & Plan     1. Essential hypertension Up today, but has not yet taken her BP pill. Is going to start checking it home and notify me if elevated.   2. Hypothyroidism, unspecified type  - TSH  3. Prediabetes  - Hemoglobin A1c  4. Chronic kidney disease (CKD), stage III (moderate) (HCC)   5. Hypercholesterolemia without hypertriglyceridemia She is tolerating simvastatin well with no adverse effects.   - Comprehensive metabolic panel - Lipid panel  6. Constipation, unspecified constipation type  - docusate sodium (COLACE) 100 MG capsule; Take 1 capsule (100 mg total) by mouth every evening.    The entirety of the information documented in the History of Present Illness, Review of Systems and Physical Exam were personally obtained by me. Portions of this information were initially documented by Ashley Royalty, CMA and reviewed by me for thoroughness and accuracy.       [name], MD  [facility]

## 2018-08-11 NOTE — Patient Instructions (Addendum)
.   Please review the attached list of medications and notify my office if there are any errors.   . Please bring all of your medications to every appointment so we can make sure that our medication list is the same as yours.    You can take OTC Docusate stool softener 100mg  every evening for constipation. You can also take Metamucil once a day if you nee to    Start checking blood pressure at home 3-4 day a week and let me know if it is running over 140/90 consistently

## 2018-08-12 LAB — COMPREHENSIVE METABOLIC PANEL
ALT: 22 IU/L (ref 0–32)
AST: 27 IU/L (ref 0–40)
Albumin/Globulin Ratio: 1.7 (ref 1.2–2.2)
Albumin: 4.3 g/dL (ref 3.6–4.6)
Alkaline Phosphatase: 79 IU/L (ref 39–117)
BUN/Creatinine Ratio: 25 (ref 12–28)
BUN: 35 mg/dL — ABNORMAL HIGH (ref 8–27)
Bilirubin Total: 0.4 mg/dL (ref 0.0–1.2)
CO2: 21 mmol/L (ref 20–29)
Calcium: 10.1 mg/dL (ref 8.7–10.3)
Chloride: 101 mmol/L (ref 96–106)
Creatinine, Ser: 1.38 mg/dL — ABNORMAL HIGH (ref 0.57–1.00)
GFR calc Af Amer: 39 mL/min/{1.73_m2} — ABNORMAL LOW (ref >59)
GFR calc non Af Amer: 34 mL/min/{1.73_m2} — ABNORMAL LOW (ref >59)
Globulin, Total: 2.6 g/dL (ref 1.5–4.5)
Glucose: 109 mg/dL — ABNORMAL HIGH (ref 65–99)
Potassium: 4 mmol/L (ref 3.5–5.2)
Sodium: 142 mmol/L (ref 134–144)
Total Protein: 6.9 g/dL (ref 6.0–8.5)

## 2018-08-12 LAB — LIPID PANEL
Chol/HDL Ratio: 3.2 ratio (ref 0.0–4.4)
Cholesterol, Total: 145 mg/dL (ref 100–199)
HDL: 46 mg/dL (ref >39)
LDL Calculated: 80 mg/dL (ref 0–99)
Triglycerides: 94 mg/dL (ref 0–149)
VLDL Cholesterol Cal: 19 mg/dL (ref 5–40)

## 2018-08-12 LAB — HEMOGLOBIN A1C
Est. average glucose Bld gHb Est-mCnc: 114 mg/dL
Hgb A1c MFr Bld: 5.6 % (ref 4.8–5.6)

## 2018-08-12 LAB — TSH: TSH: 3.79 u[IU]/mL (ref 0.450–4.500)

## 2018-08-13 ENCOUNTER — Telehealth: Payer: Self-pay

## 2018-08-13 NOTE — Telephone Encounter (Signed)
Left message to call back  

## 2018-08-13 NOTE — Telephone Encounter (Signed)
Pt's daughter Marcille Buffy returned missed call.  Please call pt back.  Thanks, [employer]

## 2018-08-13 NOTE — Telephone Encounter (Signed)
-----   Message from Birdie Sons, MD sent at 08/12/2018  9:02 AM EDT ----- Slightly impaired kidney functions, but stable. Rest of labs including thyroid, cholesterol and blood sugar are very good. Check every six months.

## 2018-08-16 NOTE — Telephone Encounter (Signed)
Patient's daughter advised as directed below.

## 2018-08-23 ENCOUNTER — Other Ambulatory Visit: Payer: Self-pay | Admitting: Family Medicine

## 2018-10-04 ENCOUNTER — Other Ambulatory Visit: Payer: Self-pay | Admitting: Family Medicine

## 2018-10-04 DIAGNOSIS — I10 Essential (primary) hypertension: Secondary | ICD-10-CM

## 2018-11-05 ENCOUNTER — Other Ambulatory Visit: Payer: Self-pay | Admitting: Family Medicine

## 2018-12-02 ENCOUNTER — Ambulatory Visit: Payer: PPO | Admitting: Podiatry

## 2018-12-06 ENCOUNTER — Other Ambulatory Visit: Payer: Self-pay | Admitting: Family Medicine

## 2018-12-06 DIAGNOSIS — E7800 Pure hypercholesterolemia, unspecified: Secondary | ICD-10-CM

## 2018-12-06 DIAGNOSIS — M1A9XX0 Chronic gout, unspecified, without tophus (tophi): Secondary | ICD-10-CM

## 2018-12-06 MED ORDER — ALLOPURINOL 100 MG PO TABS: ORAL_TABLET | ORAL | 3 refills | Status: DC

## 2018-12-06 NOTE — Telephone Encounter (Signed)
[facility] faxed refill request for the following medications: ? ?allopurinol (ZYLOPRIM) 100 MG tablet  ? ?Please advise. ? ?

## 2018-12-14 ENCOUNTER — Ambulatory Visit: Payer: PPO | Admitting: Podiatry

## 2018-12-14 ENCOUNTER — Other Ambulatory Visit: Payer: Self-pay

## 2018-12-14 ENCOUNTER — Encounter: Payer: Self-pay | Admitting: Podiatry

## 2018-12-14 DIAGNOSIS — L97512 Non-pressure chronic ulcer of other part of right foot with fat layer exposed: Secondary | ICD-10-CM | POA: Diagnosis not present

## 2018-12-14 DIAGNOSIS — B351 Tinea unguium: Secondary | ICD-10-CM

## 2018-12-14 DIAGNOSIS — M79676 Pain in unspecified toe(s): Secondary | ICD-10-CM

## 2018-12-16 NOTE — Progress Notes (Signed)
   SUBJECTIVE Patient presents to office today complaining of elongated, thickened nails that cause pain while ambulating in shoes. She is unable to trim her own nails. Patient is here for further evaluation and treatment.  Past Medical History:  Diagnosis Date  . Hyperlipidemia   . Hypertension     OBJECTIVE General Patient is awake, alert, and oriented x 3 and in no acute distress.  Derm Wound #1 noted to the right great toe measuring approximately 0.5 x 0.3 x 0.1 cm.   To the above-noted ulceration, there is no eschar. There is a moderate amount of slough, fibrin and necrotic tissue. Granulation tissue and wound base is red. There is no malodor. There is a minimal amount of serosanginous drainage noted. Periwound integrity is intact.  Skin is dry and supple bilateral. Remaining integument unremarkable. Nails are tender, long, thickened and dystrophic with subungual debris, consistent with onychomycosis, 1-5 bilateral. No signs of infection noted.  Vasc  DP and PT pedal pulses palpable bilaterally. Temperature gradient within normal limits.   Neuro Epicritic and protective threshold sensation grossly intact bilaterally.   Musculoskeletal Exam No symptomatic pedal deformities noted bilateral. Muscular strength within normal limits.  ASSESSMENT 1. Onychodystrophic nails 1-5 bilateral with hyperkeratosis of nails.  2. Onychomycosis of nail due to dermatophyte bilateral 3.  Ulceration of the right great toe  PLAN OF CARE 1. Patient evaluated today.  2. Instructed to maintain good pedal hygiene and foot care.  3. Mechanical debridement of nails 1-5 bilaterally performed using a nail nipper. Filed with dremel without incident.  4. Medically necessary excisional debridement including subcutaneous tissue was performed using a tissue nipper and a chisel blade. Excisional debridement of all the necrotic nonviable tissue down to healthy bleeding viable tissue was performed with  post-debridement measurements same as pre-. 5. The wound was cleansed and dry sterile dressing applied. 6. Recommended Silvadene cream daily with a bandage.  7. Return to clinic in 3 mos.   Daughter is Genie.    Edrick Kins, DPM [facility]  Dr. Edrick Kins, [address]                                        [place], [place] [address]                Office [phone]  Fax [phone]

## 2019-01-17 DIAGNOSIS — H353122 Nonexudative age-related macular degeneration, left eye, intermediate dry stage: Secondary | ICD-10-CM | POA: Diagnosis not present

## 2019-01-18 ENCOUNTER — Telehealth (INDEPENDENT_AMBULATORY_CARE_PROVIDER_SITE_OTHER): Payer: PPO | Admitting: Family Medicine

## 2019-01-18 DIAGNOSIS — N3000 Acute cystitis without hematuria: Secondary | ICD-10-CM | POA: Diagnosis not present

## 2019-01-18 NOTE — Telephone Encounter (Signed)
Yes, she can drop off a urine sample

## 2019-01-18 NOTE — Telephone Encounter (Signed)
Pt's daughter stated pt has very strong smelling urine. She does not have any other symptoms only the strong smell. Pt's daughter would like to know if she can bring in a sample to be tested without pt having to come into practice. Please advise.

## 2019-01-18 NOTE — Telephone Encounter (Signed)
From [facility] 

## 2019-01-18 NOTE — Telephone Encounter (Signed)
Left message advising patient's daughter that it is okay to drop off a urine sample.

## 2019-01-19 ENCOUNTER — Other Ambulatory Visit: Payer: Self-pay | Admitting: Family Medicine

## 2019-01-19 DIAGNOSIS — N3000 Acute cystitis without hematuria: Secondary | ICD-10-CM | POA: Diagnosis not present

## 2019-01-19 DIAGNOSIS — N3001 Acute cystitis with hematuria: Secondary | ICD-10-CM

## 2019-01-19 LAB — POCT URINALYSIS DIPSTICK
Bilirubin, UA: NEGATIVE
Glucose, UA: NEGATIVE
Ketones, UA: NEGATIVE
Nitrite, UA: POSITIVE
Protein, UA: POSITIVE — AB
Spec Grav, UA: 1.01 (ref 1.010–1.025)
Urobilinogen, UA: 0.2 E.U./dL
pH, UA: 6.5 (ref 5.0–8.0)

## 2019-01-19 MED ORDER — CEPHALEXIN 500 MG PO CAPS: 500.0000 mg | ORAL_CAPSULE | Freq: Two times a day (BID) | ORAL | 0 refills | Status: AC

## 2019-01-19 NOTE — Telephone Encounter (Signed)
Patients daughter has dropped off urine specimen, please see results in chart. I will be sending urine off for culture. KW

## 2019-01-21 LAB — URINE CULTURE

## 2019-03-15 ENCOUNTER — Ambulatory Visit: Payer: PPO | Admitting: Podiatry

## 2019-03-15 ENCOUNTER — Other Ambulatory Visit: Payer: Self-pay

## 2019-03-15 ENCOUNTER — Encounter: Payer: Self-pay | Admitting: Podiatry

## 2019-03-15 DIAGNOSIS — B351 Tinea unguium: Secondary | ICD-10-CM

## 2019-03-15 DIAGNOSIS — L989 Disorder of the skin and subcutaneous tissue, unspecified: Secondary | ICD-10-CM | POA: Diagnosis not present

## 2019-03-15 DIAGNOSIS — M79676 Pain in unspecified toe(s): Secondary | ICD-10-CM

## 2019-03-17 NOTE — Progress Notes (Signed)
    Subjective: Patient is a 86 y.o. female presenting to the office today for follow up evaluation of painful callus lesion(s) noted to the bilateral feet. Walking increases the pain. She has not had any recent treatment for the symptoms.  Patient also complains of elongated, thickened nails that cause pain while ambulating in shoes. She is unable to trim her own nails. Patient presents today for further treatment and evaluation.  Past Medical History:  Diagnosis Date  . Hyperlipidemia   . Hypertension     Objective:  Physical Exam General: Alert and oriented x3 in no acute distress  Dermatology: Hyperkeratotic lesion(s) present on the bilateral feet. Pain on palpation with a central nucleated core noted. Skin is warm, dry and supple bilateral lower extremities. Negative for open lesions or macerations. Nails are tender, long, thickened and dystrophic with subungual debris, consistent with onychomycosis, 1-5 bilateral. No signs of infection noted.  Vascular: Palpable pedal pulses bilaterally. No edema or erythema noted. Capillary refill within normal limits.  Neurological: Epicritic and protective threshold grossly intact bilaterally.   Musculoskeletal Exam: Pain on palpation at the keratotic lesion(s) noted. Range of motion within normal limits bilateral. Muscle strength 5/5 in all groups bilateral.  Assessment: 1. Onychodystrophic nails 1-5 bilateral with hyperkeratosis of nails.  2. Onychomycosis of nail due to dermatophyte bilateral 3. Pre-ulcerative callus lesions noted to the bilateral feet x 2   Plan of Care:  1. Patient evaluated. 2. Excisional debridement of keratoic lesion(s) using a chisel blade was performed without incident.  3. Dressed with light dressing. 4. Mechanical debridement of nails 1-5 bilaterally performed using a nail nipper. Filed with dremel without incident.  5. Patient is to return to the clinic in 3 months.   Daughter is Genie.   Edrick Kins,  DPM [facility]  Dr. Edrick Kins, [address]                                        [place], [place] [address]                Office [phone]  Fax [phone]

## 2019-05-13 ENCOUNTER — Telehealth: Payer: Self-pay

## 2019-05-13 ENCOUNTER — Ambulatory Visit (INDEPENDENT_AMBULATORY_CARE_PROVIDER_SITE_OTHER): Payer: PPO | Admitting: Physician Assistant

## 2019-05-13 ENCOUNTER — Encounter: Payer: Self-pay | Admitting: Physician Assistant

## 2019-05-13 ENCOUNTER — Other Ambulatory Visit: Payer: Self-pay

## 2019-05-13 VITALS — BP 138/88 | HR 76 | Temp 96.6°F | Wt 167.2 lb

## 2019-05-13 DIAGNOSIS — R3989 Other symptoms and signs involving the genitourinary system: Secondary | ICD-10-CM

## 2019-05-13 DIAGNOSIS — N8110 Cystocele, unspecified: Secondary | ICD-10-CM | POA: Diagnosis not present

## 2019-05-13 DIAGNOSIS — N3090 Cystitis, unspecified without hematuria: Secondary | ICD-10-CM

## 2019-05-13 LAB — POCT URINALYSIS DIPSTICK
Bilirubin, UA: NEGATIVE
Glucose, UA: NEGATIVE
Ketones, UA: NEGATIVE
Nitrite, UA: POSITIVE
Protein, UA: POSITIVE — AB
Spec Grav, UA: 1.02 (ref 1.010–1.025)
Urobilinogen, UA: 0.2 E.U./dL
pH, UA: 6.5 (ref 5.0–8.0)

## 2019-05-13 MED ORDER — AMOXICILLIN-POT CLAVULANATE 875-125 MG PO TABS: 1.0000 | ORAL_TABLET | Freq: Two times a day (BID) | ORAL | 0 refills | Status: AC

## 2019-05-13 MED ORDER — CEPHALEXIN 500 MG PO CAPS: 500.0000 mg | ORAL_CAPSULE | Freq: Two times a day (BID) | ORAL | 0 refills | Status: DC

## 2019-05-13 NOTE — Patient Instructions (Signed)
Pelvic Organ Prolapse Pelvic organ prolapse is the stretching, bulging, or dropping of pelvic organs into an abnormal position. It happens when the muscles and tissues that surround and support pelvic structures become weak or stretched. Pelvic organ prolapse can involve the:  Vagina (vaginal prolapse).  Uterus (uterine prolapse).  Bladder (cystocele).  Rectum (rectocele).  Intestines (enterocele). When organs other than the vagina are involved, they often bulge into the vagina or protrude from the vagina, depending on how severe the prolapse is. What are the causes? This condition may be caused by:  Pregnancy, labor, and childbirth.  Past pelvic surgery.  Decreased production of the hormone estrogen associated with menopause.  Consistently lifting more than 50 lb (23 kg).  Obesity.  Long-term inability to pass stool (chronic constipation).  A cough that lasts a long time (chronic).  Buildup of fluid in the abdomen due to certain diseases and other conditions. What are the signs or symptoms? Symptoms of this condition include:  Passing a little urine (loss of bladder control) when you cough, sneeze, strain, and exercise (stress incontinence). This may be worse immediately after childbirth. It may gradually improve over time.  Feeling pressure in your pelvis or vagina. This pressure may increase when you cough or when you are passing stool.  A bulge that protrudes from the opening of your vagina.  Difficulty passing urine or stool.  Pain in your lower back.  Pain, discomfort, or disinterest in sex.  Repeated bladder infections (urinary tract infections).  Difficulty inserting a tampon. In some people, this condition causes no symptoms. How is this diagnosed? This condition may be diagnosed based on a vaginal and rectal exam. During the exam, you may be asked to cough and strain while you are lying down, sitting, and standing up. Your health care provider will  determine if other tests are required, such as bladder function tests. How is this treated? Treatment for this condition may depend on your symptoms. Treatment may include:  Lifestyle changes, such as changes to your diet.  Emptying your bladder at scheduled times (bladder training therapy). This can help reduce or avoid urinary incontinence.  Estrogen. Estrogen may help mild prolapse by increasing the strength and tone of pelvic floor muscles.  Kegel exercises. These may help mild cases of prolapse by strengthening and tightening the muscles of the pelvic floor.  A soft, flexible device that helps support the vaginal walls and keep pelvic organs in place (pessary). This is inserted into your vagina by your health care provider.  Surgery. This is often the only form of treatment for severe prolapse. Follow these instructions at home:  Avoid drinking beverages that contain caffeine or alcohol.  Increase your intake of high-fiber foods. This can help decrease constipation and straining during bowel movements.  Lose weight if recommended by your health care provider.  Wear a sanitary pad or adult diapers if you have urinary incontinence.  Avoid heavy lifting and straining with exercise and work. Do not hold your breath when you perform mild to moderate lifting and exercise activities. Limit your activities as directed by your health care provider.  Do Kegel exercises as directed by your health care provider. To do this: ? Squeeze your pelvic floor muscles tight. You should feel a tight lift in your rectal area and a tightness in your vaginal area. Keep your stomach, buttocks, and legs relaxed. ? Hold the muscles tight for up to 10 seconds. ? Relax your muscles. ? Repeat this exercise 50 times a day,   or as many times as told by your health care provider. Continue to do this exercise for at least 4-6 weeks, or for as long as told by your health care provider.  Take over-the-counter and  prescription medicines only as told by your health care provider.  If you have a pessary, take care of it as told by your health care provider.  Keep all follow-up visits as told by your health care provider. This is important. Contact a health care provider if you:  Have symptoms that interfere with your daily activities or sex life.  Need medicine to help with the discomfort.  Notice bleeding from your vagina that is not related to your period.  Have a fever.  Have pain or bleeding when you urinate.  Have bleeding when you pass stool.  Pass urine when you have sex.  Have chronic constipation.  Have a pessary that falls out.  Have bad smelling vaginal discharge.  Have an unusual, low pain in your abdomen. Summary  Pelvic organ prolapse is the stretching, bulging, or dropping of pelvic organs into an abnormal position. It happens when the muscles and tissues that surround and support pelvic structures become weak or stretched.  When organs other than the vagina are involved, they often bulge into the vagina or protrude from the vagina, depending on how severe the prolapse is.  In most cases, this condition needs to be treated only if it produces symptoms. Treatment may include lifestyle changes, estrogen, Kegel exercises, pessary insertion, or surgery.  Avoid heavy lifting and straining with exercise and work. Do not hold your breath when you perform mild to moderate lifting and exercise activities. Limit your activities as directed by your health care provider. This information is not intended to replace advice given to you by your health care provider. Make sure you discuss any questions you have with your health care provider. Document Revised: 02/25/2017 Document Reviewed: 02/25/2017 Elsevier Patient Education  2020 Elsevier Inc.  

## 2019-05-13 NOTE — Telephone Encounter (Signed)
Copied from [place] [id number]. Topic: Clinical - COVID Pre-Screen >> May 12, 2019  2:11 PM Sheran Luz wrote: 1. To the best of your knowledge, have you been in close contact with anyone with a confirmed diagnosis of COVID 19?  No  If no - Proceed to next question; If yes - Schedule patient for a virtual visit  2. Have you had any one or more of the following: fever, chills, cough, shortness of breath or any flu-like symptoms? No  If no - Proceed to next question; If yes - Schedule patient for a virtual visit  3. Have you been diagnosed with or have a previous diagnosis of COVID 19?  No  If no - Proceed to next question; If yes - Schedule patient for a virtual visit  4. I am going to go over a few other symptoms with you. Please let me know if you are experiencing any of the following: no  Ear, nose or throat discomfort  A sore throat  Headache  Muscle pain  Diarrhea  Loss of taste or smell  If no - Continue with scheduling process; If yes - Document in scheduling notes   Thank you for answering these questions. Please know we will ask you these questions or similar questions when you arrive for your appointment and again it's how we are keeping everyone safe. Also, to keep you safe, please use the provided hand sanitizer when you enter the building. [name], we are asking everyone in the building to wear a mask because they help [place] prevent the spread of germs.   Do you have a mask of your own, if not, we are happy to provide one for you. The last thing I want to go over with you is the no visitor guidelines. This means no one can attend the appointment with you unless you need physical assistance. I understand this may be different from your past appointments and I know this may be difficult but please know if someone is driving you we are happy to call them for you once your appointment is over.

## 2019-05-13 NOTE — Progress Notes (Signed)
Patient: [name] [name] Female    DOB: 07/16/1926   86 y.o.   MRN: [id number] Visit Date: 05/13/2019  Today's Provider: Trinna Post, PA-C   Chief Complaint  Patient presents with  . Vaginal Bleeding   Subjective:     Vaginal Bleeding The patient's primary symptoms include vaginal bleeding. This is a new problem. The current episode started yesterday. The patient is experiencing no pain. Pertinent negatives include no abdominal pain, back pain or discolored urine. The vaginal discharge was bloody. The vaginal bleeding is lighter than menses. She has not been passing clots. She has not been passing tissue. Nothing aggravates the symptoms.   Patient had a UTI in 01/2019 which was treated with Keflex. She also has a bladder prolapse that has been present for an unknown period of time.   Allergies  Allergen Reactions  . Atorvastatin     myalgias/arthralgias.  . Lovastatin     Other reaction(s): Muscle Pain     Current Outpatient Medications:  .  allopurinol (ZYLOPRIM) 100 MG tablet, TAKE TWO TABLETS EVERY DAY, Disp: 180 tablet, Rfl: 3 .  amLODipine (NORVASC) 10 MG tablet, TAKE ONE TABLET BY MOUTH DAILY, Disp: 90 tablet, Rfl: 3 .  CALCIUM CARBONATE-VITAMIN D PO, Take 2 tablets by mouth daily. , Disp: , Rfl:  .  docusate sodium (COLACE) 100 MG capsule, Take 1 capsule (100 mg total) by mouth every evening., Disp: , Rfl:  .  GARLIC PO, Take 5 mg by mouth daily., Disp: , Rfl:  .  ibuprofen (ADVIL,MOTRIN) 600 MG tablet, Take by mouth every 6 (six) hours as needed. , Disp: , Rfl:  .  levothyroxine (SYNTHROID) 75 MCG tablet, TAKE 1 TABLET EVERY DAY ON EMPTY STOMACHWITH A GLASS OF WATER AT LEAST 30-60 MINBEFORE BREAKFAST, Disp: 90 tablet, Rfl: 4 .  Multiple Vitamins-Minerals (PRESERVISION AREDS 2 PO), Take 2 tablets by mouth daily., Disp: , Rfl:  .  naproxen sodium (ALEVE) 220 MG tablet, Take 220 mg by mouth., Disp: , Rfl:  .  simvastatin (ZOCOR) 20 MG tablet, TAKE ONE TABLET  EVERY DAY, Disp: 90 tablet, Rfl: 3 .  valsartan-hydrochlorothiazide (DIOVAN-HCT) 320-25 MG tablet, TAKE ONE TABLET BY MOUTH EVERY DAY, Disp: 90 tablet, Rfl: 4 .  aspirin 81 MG tablet, Take 81 mg by mouth daily. , Disp: , Rfl:  .  Krill Oil 300 MG CAPS, Take 300 mg by mouth daily., Disp: , Rfl:   Review of Systems  Gastrointestinal: Negative for abdominal pain.  Genitourinary: Positive for vaginal bleeding.  Musculoskeletal: Negative for back pain.    Social History   Tobacco Use  . Smoking status: Former Smoker    Types: Cigarettes    Quit date: 02/18/2000    Years since quitting: 19.2  . Smokeless tobacco: Never Used  Substance Use Topics  . Alcohol use: No    Alcohol/week: 0.0 standard drinks      Objective:   BP 138/88 (BP Location: Left Arm, Patient Position: Sitting, Cuff Size: Normal)   Pulse 76   Temp (!) 96.6 F (35.9 C) (Temporal)   Wt 167 lb 3.2 oz (75.8 kg)   SpO2 97%   BMI 31.59 kg/m  Vitals:   05/13/19 0916  BP: 138/88  Pulse: 76  Temp: (!) 96.6 F (35.9 C)  TempSrc: Temporal  SpO2: 97%  Weight: 167 lb 3.2 oz (75.8 kg)  Body mass index is 31.59 kg/m.   Physical Exam Constitutional:  Appearance: Normal appearance.  Cardiovascular:     Rate and Rhythm: Normal rate and regular rhythm.     Heart sounds: Normal heart sounds.  Pulmonary:     Effort: Pulmonary effort is normal.     Breath sounds: Normal breath sounds.  Abdominal:     Palpations: Abdomen is soft.     Tenderness: There is no abdominal tenderness.  Genitourinary:    Comments: Severe prolapse with ulceration. Please see picture. No purulence.  Skin:    General: Skin is warm and dry.  Neurological:     Mental Status: She is alert and oriented to person, place, and time. Mental status is at baseline.  Psychiatric:        Mood and Affect: Mood normal.        Behavior: Behavior normal.     Media Information   Document Information  Photos  Bladder prolapse   05/13/2019 09:38   Attached To:  Office Visit on 05/13/19 with Trinna Post, PA-C  Source Information  Carles Collet M, PA-C  [facility]     No results found for any visits on 05/13/19.     Assessment & Plan    1. Cystitis  - POCT urinalysis dipstick - amoxicillin-clavulanate (AUGMENTIN) 875-125 MG tablet; Take 1 tablet by mouth 2 (two) times daily for 7 days.  Dispense: 14 tablet; Refill: 0 - Urine Culture  2. Female bladder prolapse  - POCT urinalysis dipstick - Ambulatory referral to Urology - Urine Culture  3. Possible urinary tract infection  - POCT urinalysis dipstick - Urine Culture  The entirety of the information documented in the History of Present Illness, Review of Systems and Physical Exam were personally obtained by me. Portions of this information were initially documented by [facility] and reviewed by me for thoroughness and accuracy.       Trinna Post, PA-C  [facility]

## 2019-05-15 LAB — URINE CULTURE

## 2019-05-16 ENCOUNTER — Telehealth: Payer: Self-pay

## 2019-05-16 NOTE — Telephone Encounter (Signed)
-----   Message from Trinna Post, [place] sent at 05/16/2019  1:31 PM EDT ----- Her urine culture grew out E. Coli which was sensitive for Augmentin. Please let us know if she is not feeling better. We have also placed the urgent referral to urology.

## 2019-05-16 NOTE — Telephone Encounter (Signed)
Patient's daughter [name] [name] was advised and states she will be waiting for the call for the urology appointment.

## 2019-05-30 ENCOUNTER — Other Ambulatory Visit: Payer: Self-pay

## 2019-05-30 ENCOUNTER — Encounter: Payer: Self-pay | Admitting: Urology

## 2019-05-30 ENCOUNTER — Ambulatory Visit: Payer: PPO | Admitting: Urology

## 2019-05-30 VITALS — BP 144/72 | HR 75 | Ht <= 58 in | Wt 168.0 lb

## 2019-05-30 DIAGNOSIS — N8110 Cystocele, unspecified: Secondary | ICD-10-CM | POA: Diagnosis not present

## 2019-05-30 MED ORDER — PREMARIN 0.625 MG/GM VA CREA: TOPICAL_CREAM | VAGINAL | 12 refills | Status: DC

## 2019-05-30 NOTE — Progress Notes (Signed)
05/30/2019 10:00 AM   [name] [name] 1926-05-25 [id number]  Referring provider: Birdie Sons, MD [address] [place],  [name]  Chief Complaint  Patient presents with  . Bladder Prolapse    HPI: I was consulted to assess the patient for a possible ulcer or vaginal bleeding associated with prolapse.  She has had the area associated with blood for at least a month.  She wears 2 pads a day that are quite wet but the history was limited but her family member was helpful today.Family has been applying Desitin  She has had a hysterectomy.  On review of medical record patient had 2+ urine culture since December 2020  No history of kidney stones previous to surgery or bladder infections     PMH: Past Medical History:  Diagnosis Date  . Hyperlipidemia   . Hypertension     Surgical History: Past Surgical History:  Procedure Laterality Date  . ABDOMINAL HYSTERECTOMY  1995   abdominal-uterine prolapse, 1 ovary removed, bladder tact  . CARDIAC CATHETERIZATION    . CHOLECYSTECTOMY    . TONSILLECTOMY AND ADENOIDECTOMY      Home Medications:  Allergies as of 05/30/2019      Reactions   Atorvastatin    myalgias/arthralgias.   Lovastatin    Other reaction(s): Muscle Pain      Medication List       Accurate as of May 30, 2019 10:00 AM. If you have any questions, ask your nurse or doctor.        allopurinol 100 MG tablet Commonly known as: ZYLOPRIM TAKE TWO TABLETS EVERY DAY   amLODipine 10 MG tablet Commonly known as: NORVASC TAKE ONE TABLET BY MOUTH DAILY   aspirin 81 MG tablet Take 81 mg by mouth daily.   CALCIUM CARBONATE-VITAMIN D PO Take 2 tablets by mouth daily.   docusate sodium 100 MG capsule Commonly known as: COLACE Take 1 capsule (100 mg total) by mouth every evening.   GARLIC PO Take 5 mg by mouth daily.   ibuprofen 600 MG tablet Commonly known as: ADVIL Take by mouth every 6 (six) hours as needed.   Krill Oil  300 MG Caps Take 300 mg by mouth daily.   levothyroxine 75 MCG tablet Commonly known as: SYNTHROID TAKE 1 TABLET EVERY DAY ON EMPTY STOMACHWITH A GLASS OF WATER AT LEAST 30-60 MINBEFORE BREAKFAST   naproxen sodium 220 MG tablet Commonly known as: ALEVE Take 220 mg by mouth.   PRESERVISION AREDS 2 PO Take 2 tablets by mouth daily.   simvastatin 20 MG tablet Commonly known as: ZOCOR TAKE ONE TABLET EVERY DAY   valsartan-hydrochlorothiazide 320-25 MG tablet Commonly known as: DIOVAN-HCT TAKE ONE TABLET BY MOUTH EVERY DAY       Allergies:  Allergies  Allergen Reactions  . Atorvastatin     myalgias/arthralgias.  . Lovastatin     Other reaction(s): Muscle Pain    Family History: Family History  Problem Relation Age of Onset  . Ovarian cancer Mother   . Alcohol abuse Sister     Social History:  reports that she quit smoking about 19 years ago. Her smoking use included cigarettes. She has never used smokeless tobacco. She reports that she does not drink alcohol or use drugs.  ROS:  Physical Exam: BP (!) 144/72   Pulse 75   Ht 4\' 10"  (1.473 m)   Wt 76.2 kg   BMI 35.11 kg/m   Constitutional:  Alert and oriented, No acute distress. HEENT: [name], moist mucus membranes.  Trachea midline, no masses. Cardiovascular: No clubbing, cyanosis, or edema. Respiratory: Normal respiratory effort, no increased work of breathing. G was apparent.  It was nontender I: Abdomen is soft, nontender, nondistended, no abdominal masses GU: On pelvic examination patient had vault prolapse.  I did not do an extensive examination due to her lack of mobility.  It appeared that the cuff was just exiting the introitus 2 cm.  There was a large superficial ulcer 3 x 2 cm. Desitin  Skin: No rashes, bruises or suspicious lesions. Lymph: No cervical or inguinal adenopathy. Neurologic: Grossly intact, no focal deficits, moving all 4  extremities. Psychiatric: Normal mood and affect.  Laboratory Data: Lab Results  Component Value Date   WBC 9.0 08/06/2016   HGB 13.1 08/06/2016   HCT 39.2 08/06/2016   MCV 90 08/06/2016   PLT 134 (L) 08/06/2016    Lab Results  Component Value Date   CREATININE 1.38 (H) 08/11/2018    No results found for: PSA  No results found for: TESTOSTERONE  Lab Results  Component Value Date   HGBA1C 5.6 08/11/2018    Urinalysis    Component Value Date/Time   BILIRUBINUR Negative 05/13/2019 0943   PROTEINUR Positive (A) 05/13/2019 0943   UROBILINOGEN 0.2 05/13/2019 0943   NITRITE Positive 05/13/2019 0943   LEUKOCYTESUR Moderate (2+) (A) 05/13/2019 0943    Pertinent Imaging:   Assessment & Plan: Patient has urinary incontinence.  I think we need to have reasonable expectations regarding this.  She has had 2 recent bladder infections documented.  I am not going to place the patient on urinary prophylaxis at this stage.  I mention to the family member that this is something we sometimes do if someone is having too many bladder infections.  The history was difficult.  The role of estrogen cream once a day with the Desitin cream once a day discussed.  Watchful waiting for incontinence.  Change pads frequently.  The Desitin will be used twice a day every second day and the estrogen will be used every second day in the morning with the Desitin at night.  Reevaluate 8 weeks.  Samples and prescription given  There are no diagnoses linked to this encounter.  No follow-ups on file.  Reece Packer, MD  [facility] [address], [place], [place] [address] [phone]

## 2019-06-02 ENCOUNTER — Telehealth: Payer: Self-pay | Admitting: *Deleted

## 2019-06-02 NOTE — Telephone Encounter (Signed)
Received a call on the Triage line from daughter concerning redness and swelling on her mother's face after using Premarin cream for two days. Instructed to make sure she was washing her hands and not touching her face after applying, advised to check are where she is applying for no other signs of redness or swelling.  Discontinue using for one week, see if symptoms subside. Contact PCP if swelling and redness do not improve. Daughter voiced understanding.

## 2019-06-08 NOTE — Progress Notes (Deleted)
Established patient visit    Patient: [name] [name]   DOB: [birth year]   86 y.o. Female  MRN: [id number] Visit Date: 06/08/2019  Today's healthcare provider: [name], MD   No chief complaint on file.  Subjective    HPI Hypertension, follow-up  BP Readings from Last 3 Encounters:  05/30/19 (!) 144/72  05/13/19 138/88  08/11/18 (!) 156/75   Wt Readings from Last 3 Encounters:  05/30/19 168 lb (76.2 kg)  05/13/19 167 lb 3.2 oz (75.8 kg)  08/11/18 150 lb (68 kg)     She was last seen for hypertension {NUMBERS 1-12:18279} {days/wks/mos/yrs:310907} ago.  BP at that visit was ***. Management since that visit includes ***.  She reports {excellent/good/fair/poor:19665} compliance with treatment. She {is/is not:9024} having side effects. {document side effects if present:1} She is following a {diet:21022986} diet. She {is/is not:9024} exercising. She {does/does not:200015} smoke.  Use of agents associated with hypertension: {bp agents assoc with hypertension:511::"none"}.   Outside blood pressures are {***enter patient reported home BP readings, or 'not being checked':1}.  Symptoms:  YES NO    []    []    Chest Pain   []    []    Chest pressure/discomfort   []    []    Palpitations   []    []    Dyspnea   []    []    Orthopnea   []    []    Paroxysmal nocturnal dyspnea   []   []    Lower extremity edema   []    []   Syncope   Pertinent labs: Lab Results  Component Value Date   CHOL 145 08/11/2018   HDL 46 08/11/2018   LDLCALC 80 08/11/2018   TRIG 94 08/11/2018   CHOLHDL 3.2 08/11/2018   Lab Results  Component Value Date   NA 142 08/11/2018   K 4.0 08/11/2018   CO2 21 08/11/2018   GLUCOSE 109 (H) 08/11/2018   BUN 35 (H) 08/11/2018   CREATININE 1.38 (H) 08/11/2018   CALCIUM 10.1 08/11/2018   GFRNONAA 34 (L) 08/11/2018   GFRAA 39 (L) 08/11/2018     The ASCVD Risk score (Goff DC Jr., et al., 2013) failed to calculate for the following reasons:   The 2013 ASCVD  risk score is only valid for ages 72 to 43   ---------------------------------------------------------------------------------------------------  {Show patient history (optional):23778::" "}   Medications: Outpatient Medications Prior to Visit  Medication Sig  . allopurinol (ZYLOPRIM) 100 MG tablet TAKE TWO TABLETS EVERY DAY  . amLODipine (NORVASC) 10 MG tablet TAKE ONE TABLET BY MOUTH DAILY  . aspirin 81 MG tablet Take 81 mg by mouth daily.   Marland Kitchen CALCIUM CARBONATE-VITAMIN D PO Take 2 tablets by mouth daily.   Marland Kitchen conjugated estrogens (PREMARIN) vaginal cream Apply every 2nd day  . docusate sodium (COLACE) 100 MG capsule Take 1 capsule (100 mg total) by mouth every evening.  Marland Kitchen GARLIC PO Take 5 mg by mouth daily.  Marland Kitchen ibuprofen (ADVIL,MOTRIN) 600 MG tablet Take by mouth every 6 (six) hours as needed.   Javier Docker Oil 300 MG CAPS Take 300 mg by mouth daily.  Marland Kitchen levothyroxine (SYNTHROID) 75 MCG tablet TAKE 1 TABLET EVERY DAY ON EMPTY STOMACHWITH A GLASS OF WATER AT LEAST 30-60 MINBEFORE BREAKFAST  . Multiple Vitamins-Minerals (PRESERVISION AREDS 2 PO) Take 2 tablets by mouth daily.  . naproxen sodium (ALEVE) 220 MG tablet Take 220 mg by mouth.  . simvastatin (ZOCOR) 20 MG tablet TAKE ONE TABLET EVERY DAY  . valsartan-hydrochlorothiazide (  DIOVAN-HCT) 320-25 MG tablet TAKE ONE TABLET BY MOUTH EVERY DAY   No facility-administered medications prior to visit.    Review of Systems  {Show previous labs (optional):23779::" "}   Objective    There were no vitals taken for this visit. {Show previous vital signs (optional):23777::" "}  Physical Exam  ***  No results found for any visits on 06/14/19.   Assessment & Plan    ***  No follow-ups on file.      {provider attestation***:1}   [name], MD  [facility] [phone] (phone) [phone] (fax)  [place]

## 2019-06-13 ENCOUNTER — Ambulatory Visit
Admission: RE | Admit: 2019-06-13 | Discharge: 2019-06-13 | Disposition: A | Discharge status: Home / Self Care | Payer: PPO | Source: Ambulatory Visit | Attending: Family Medicine | Admitting: Family Medicine

## 2019-06-13 ENCOUNTER — Encounter: Payer: Self-pay | Admitting: Family Medicine

## 2019-06-13 ENCOUNTER — Other Ambulatory Visit: Payer: Self-pay

## 2019-06-13 ENCOUNTER — Ambulatory Visit (INDEPENDENT_AMBULATORY_CARE_PROVIDER_SITE_OTHER): Payer: PPO | Admitting: Family Medicine

## 2019-06-13 VITALS — BP 132/62 | HR 81 | Temp 96.6°F | Resp 20 | Wt 163.0 lb

## 2019-06-13 DIAGNOSIS — M8580 Other specified disorders of bone density and structure, unspecified site: Secondary | ICD-10-CM | POA: Diagnosis not present

## 2019-06-13 DIAGNOSIS — N183 Chronic kidney disease, stage 3 unspecified: Secondary | ICD-10-CM

## 2019-06-13 DIAGNOSIS — E039 Hypothyroidism, unspecified: Secondary | ICD-10-CM | POA: Diagnosis not present

## 2019-06-13 DIAGNOSIS — M1611 Unilateral primary osteoarthritis, right hip: Secondary | ICD-10-CM | POA: Diagnosis not present

## 2019-06-13 DIAGNOSIS — I10 Essential (primary) hypertension: Secondary | ICD-10-CM | POA: Diagnosis not present

## 2019-06-13 DIAGNOSIS — M79604 Pain in right leg: Secondary | ICD-10-CM | POA: Insufficient documentation

## 2019-06-13 DIAGNOSIS — D649 Anemia, unspecified: Secondary | ICD-10-CM | POA: Diagnosis not present

## 2019-06-13 DIAGNOSIS — R7303 Prediabetes: Secondary | ICD-10-CM | POA: Diagnosis not present

## 2019-06-13 NOTE — Progress Notes (Signed)
Established patient visit   Patient: [name] [name]   DOB: 11/07/1926   86 y.o. Female  MRN: [id number] Visit Date: 06/13/2019  Today's healthcare provider: [name], MD   Chief Complaint  Patient presents with  . Difficulty Walking  . Extremity Weakness   Subjective    Extremity Weakness  The pain is present in the right upper leg and right lower leg. This is a new problem. Episode onset: 3 days ago. There has been no history of extremity trauma. The problem occurs constantly. The problem has been gradually worsening. The quality of the pain is described as aching. Associated symptoms include an inability to bear weight, joint swelling, a limited range of motion, numbness, stiffness and tingling. Pertinent negatives include no fever.  She had no symptoms when she awoke on Friday (06/11/2019) but by the time she walked herself to kitchen she started having pain in right anterior thigh with every step and eventually got the point she could not stand or walk without assistance. States she had severe pain in her leg whenever she tried to flex at her right hip. Patient daughter states that they could passively flex her hip with no pain. No effect on left side or in right lower leg. No new back pains. Pain has greatly improved today and is now able to stand without assistance and to walk with her walker, similar to her baseline.      Medications: Outpatient Medications Prior to Visit  Medication Sig  . allopurinol (ZYLOPRIM) 100 MG tablet TAKE TWO TABLETS EVERY DAY  . amLODipine (NORVASC) 10 MG tablet TAKE ONE TABLET BY MOUTH DAILY  . CALCIUM CARBONATE-VITAMIN D PO Take 2 tablets by mouth daily.   Marland Kitchen conjugated estrogens (PREMARIN) vaginal cream Apply every 2nd day  . docusate sodium (COLACE) 100 MG capsule Take 1 capsule (100 mg total) by mouth every evening.  Marland Kitchen ibuprofen (ADVIL,MOTRIN) 600 MG tablet Take by mouth every 6 (six) hours as needed.   Marland Kitchen levothyroxine (SYNTHROID) 75 MCG  tablet TAKE 1 TABLET EVERY DAY ON EMPTY STOMACHWITH A GLASS OF WATER AT LEAST 30-60 MINBEFORE BREAKFAST  . Multiple Vitamins-Minerals (PRESERVISION AREDS 2 PO) Take 2 tablets by mouth daily.  . naproxen sodium (ALEVE) 220 MG tablet Take 220 mg by mouth.  . simvastatin (ZOCOR) 20 MG tablet TAKE ONE TABLET EVERY DAY  . valsartan-hydrochlorothiazide (DIOVAN-HCT) 320-25 MG tablet TAKE ONE TABLET BY MOUTH EVERY DAY  . [DISCONTINUED] aspirin 81 MG tablet Take 81 mg by mouth daily.   . [DISCONTINUED] GARLIC PO Take 5 mg by mouth daily.  . [DISCONTINUED] Krill Oil 300 MG CAPS Take 300 mg by mouth daily.   No facility-administered medications prior to visit.    Review of Systems  Constitutional: Negative for appetite change, chills, fatigue and fever.  Respiratory: Negative for chest tightness and shortness of breath.   Cardiovascular: Negative for chest pain and palpitations.  Gastrointestinal: Negative for abdominal pain, nausea and vomiting.  Musculoskeletal: Positive for extremity weakness, myalgias (right leg) and stiffness.  Neurological: Positive for tingling, weakness (right leg) and numbness. Negative for dizziness.      Objective    BP 132/62 (BP Location: Right Arm, Cuff Size: Normal)   Pulse 81   Temp (!) 96.6 F (35.9 C) (Temporal)   Resp 20   Wt 163 lb (73.9 kg)   SpO2 96% Comment: room air  BMI 34.07 kg/m    Physical Exam  General appearance: Obese female, cooperative and  in no acute distress Head: Normocephalic, without obvious abnormality, atraumatic Respiratory: Respirations even and unlabored, normal respiratory rate Extremities: All extremities are intact. Mild tenderness along length of right anterior thigh. Stands without assistance. Does not require assistance to walk with her walker. No masses or swelling of leg.  Skin: Skin color, texture, turgor normal. No rashes seen  Psych: Appropriate mood and affect. Neurologic: Mental status: Alert, oriented to person,  place, and time, thought content appropriate.   No results found for any visits on 06/13/19.  Assessment & Plan    1. Right leg pain Localized symptoms greatly improved today. May have had muscle strain or spasm. Need to rule out bony lesion and myositis.  - DG FEMUR 1V RIGHT; Future - CK (Creatine Kinase)  2. Essential hypertension She is due for follow up htn which is now well controlled.  - Comprehensive metabolic panel  3. Prediabetes Due to check - Hemoglobin A1c  4. Hypothyroidism, unspecified type Due for - TSH  5. Osteopenia, unspecified location  - VITAMIN D 25 Hydroxy (Vit-D Deficiency, Fractures)  6. Stage 3 chronic kidney disease, unspecified whether stage 3a or 3b CKD  - CBC         The entirety of the information documented in the History of Present Illness, Review of Systems and Physical Exam were personally obtained by me. Portions of this information were initially documented by the CMA and reviewed by me for thoroughness and accuracy.      [name], MD  [facility] [phone] (phone) [phone] (fax)  [place]

## 2019-06-14 ENCOUNTER — Telehealth: Payer: Self-pay

## 2019-06-14 ENCOUNTER — Ambulatory Visit: Payer: PPO | Admitting: Podiatry

## 2019-06-14 ENCOUNTER — Ambulatory Visit: Payer: Self-pay | Admitting: Family Medicine

## 2019-06-14 ENCOUNTER — Other Ambulatory Visit: Payer: Self-pay

## 2019-06-14 ENCOUNTER — Encounter: Payer: Self-pay | Admitting: Family Medicine

## 2019-06-14 DIAGNOSIS — M79676 Pain in unspecified toe(s): Secondary | ICD-10-CM | POA: Diagnosis not present

## 2019-06-14 DIAGNOSIS — L97512 Non-pressure chronic ulcer of other part of right foot with fat layer exposed: Secondary | ICD-10-CM

## 2019-06-14 DIAGNOSIS — I739 Peripheral vascular disease, unspecified: Secondary | ICD-10-CM | POA: Insufficient documentation

## 2019-06-14 DIAGNOSIS — B351 Tinea unguium: Secondary | ICD-10-CM | POA: Diagnosis not present

## 2019-06-14 LAB — HEMOGLOBIN A1C
Est. average glucose Bld gHb Est-mCnc: 105 mg/dL
Hgb A1c MFr Bld: 5.3 % (ref 4.8–5.6)

## 2019-06-14 LAB — COMPREHENSIVE METABOLIC PANEL
ALT: 19 IU/L (ref 0–32)
AST: 26 IU/L (ref 0–40)
Albumin/Globulin Ratio: 1.5 (ref 1.2–2.2)
Albumin: 4 g/dL (ref 3.5–4.6)
Alkaline Phosphatase: 108 IU/L (ref 39–117)
BUN/Creatinine Ratio: 31 — ABNORMAL HIGH (ref 12–28)
BUN: 35 mg/dL (ref 10–36)
Bilirubin Total: <0.2 mg/dL (ref 0.0–1.2)
CO2: 22 mmol/L (ref 20–29)
Calcium: 9.8 mg/dL (ref 8.7–10.3)
Chloride: 103 mmol/L (ref 96–106)
Creatinine, Ser: 1.14 mg/dL — ABNORMAL HIGH (ref 0.57–1.00)
GFR calc Af Amer: 49 mL/min/{1.73_m2} — ABNORMAL LOW (ref >59)
GFR calc non Af Amer: 42 mL/min/{1.73_m2} — ABNORMAL LOW (ref >59)
Globulin, Total: 2.7 g/dL (ref 1.5–4.5)
Glucose: 95 mg/dL (ref 65–99)
Potassium: 4.2 mmol/L (ref 3.5–5.2)
Sodium: 139 mmol/L (ref 134–144)
Total Protein: 6.7 g/dL (ref 6.0–8.5)

## 2019-06-14 LAB — CBC
Hematocrit: 26.9 % — ABNORMAL LOW (ref 34.0–46.6)
Hemoglobin: 8.4 g/dL — ABNORMAL LOW (ref 11.1–15.9)
MCH: 26.3 pg — ABNORMAL LOW (ref 26.6–33.0)
MCHC: 31.2 g/dL — ABNORMAL LOW (ref 31.5–35.7)
MCV: 84 fL (ref 79–97)
Platelets: 179 10*3/uL (ref 150–450)
RBC: 3.2 x10E6/uL — ABNORMAL LOW (ref 3.77–5.28)
RDW: 13.2 % (ref 11.7–15.4)
WBC: 8.6 10*3/uL (ref 3.4–10.8)

## 2019-06-14 LAB — VITAMIN D 25 HYDROXY (VIT D DEFICIENCY, FRACTURES): Vit D, 25-Hydroxy: 38.1 ng/mL (ref 30.0–100.0)

## 2019-06-14 LAB — TSH: TSH: 8.96 u[IU]/mL — ABNORMAL HIGH (ref 0.450–4.500)

## 2019-06-14 LAB — CK: Total CK: 455 U/L — ABNORMAL HIGH (ref 26–161)

## 2019-06-14 MED ORDER — GENTAMICIN SULFATE 0.1 % EX CREA: 1.0000 "application " | TOPICAL_CREAM | Freq: Two times a day (BID) | CUTANEOUS | 1 refills | Status: DC

## 2019-06-14 NOTE — Telephone Encounter (Signed)
Mrs. Noreene Larsson advised of lab and x-ray report.

## 2019-06-14 NOTE — Telephone Encounter (Signed)
Patient's daughter Noreene Larsson advised as below. She verbalizes understanding and is in agreement with treatment plan. Called [facility] to add test.

## 2019-06-14 NOTE — Telephone Encounter (Signed)
-----   Message from Birdie Sons, MD sent at 06/14/2019  8:08 AM EDT ----- Odette Horns show some arthritis in hip, but leg bone is normal.

## 2019-06-14 NOTE — Telephone Encounter (Signed)
-----   Message from Birdie Sons, MD sent at 06/14/2019  8:08 AM EDT ----- Muscle enzyme is fairly high, this indicates injury or inflammation to a muscle.  This can be caused by simvastatin, or she may have just pulled a muscle. In any event, I would recommend stopping the simvastatin for the time being. Keep her follow appt in May and we will recheck levels then.   Also, she has gotten anemic. Please add ferritin, iron and IBC to labs drawn yesterday. Avoid taking any OTC NSAIDs such as Aleve or ibuprofen for the time being.

## 2019-06-16 ENCOUNTER — Telehealth (INDEPENDENT_AMBULATORY_CARE_PROVIDER_SITE_OTHER): Payer: PPO

## 2019-06-16 DIAGNOSIS — N390 Urinary tract infection, site not specified: Secondary | ICD-10-CM

## 2019-06-16 DIAGNOSIS — D509 Iron deficiency anemia, unspecified: Secondary | ICD-10-CM

## 2019-06-16 DIAGNOSIS — R319 Hematuria, unspecified: Secondary | ICD-10-CM

## 2019-06-16 DIAGNOSIS — Z1211 Encounter for screening for malignant neoplasm of colon: Secondary | ICD-10-CM

## 2019-06-16 DIAGNOSIS — R195 Other fecal abnormalities: Secondary | ICD-10-CM

## 2019-06-16 DIAGNOSIS — Z1212 Encounter for screening for malignant neoplasm of rectum: Secondary | ICD-10-CM

## 2019-06-16 LAB — IRON,TIBC AND FERRITIN PANEL
Ferritin: 22 ng/mL (ref 15–150)
Iron Saturation: 5 % — CL (ref 15–55)
Iron: 18 ug/dL — ABNORMAL LOW (ref 27–139)
Total Iron Binding Capacity: 342 ug/dL (ref 250–450)
UIBC: 324 ug/dL (ref 118–369)

## 2019-06-16 LAB — SPECIMEN STATUS REPORT

## 2019-06-16 NOTE — Progress Notes (Signed)
   SUBJECTIVE Patient presents to office today complaining of elongated, thickened nails that cause pain while ambulating in shoes. She is unable to trim her own nails.  She also reports a lesion to the right great toe that has been present for the past few months. She denies any pain or modifying factors. She has not had any treatment for the area. Patient is here for further evaluation and treatment.  Past Medical History:  Diagnosis Date  . Hyperlipidemia   . Hypertension     OBJECTIVE General Patient is awake, alert, and oriented x 3 and in no acute distress.  Derm Wound #1 noted to the right great toe measuring 0.3 x 0.3 x 0.1.   To the above-noted ulceration, there is no eschar. There is a moderate amount of slough, fibrin and necrotic tissue. Granulation tissue and wound base is red. There is no malodor. There is a minimal amount of serosanginous drainage noted. Periwound integrity is intact.  Skin is dry and supple bilateral. Nails are tender, long, thickened and dystrophic with subungual debris, consistent with onychomycosis, 1-5 bilateral. No signs of infection noted.  Vasc  DP and PT pedal pulses palpable bilaterally. Temperature gradient within normal limits.   Neuro Epicritic and protective threshold sensation grossly intact bilaterally.   Musculoskeletal Exam No symptomatic pedal deformities noted bilateral. Muscular strength within normal limits.  ASSESSMENT 1. Onychodystrophic nails 1-5 bilateral with hyperkeratosis of nails.  2. Onychomycosis of nail due to dermatophyte bilateral 3. Ulceration of the right great toe   PLAN OF CARE 1. Patient evaluated today.  2. Instructed to maintain good pedal hygiene and foot care.  3. Mechanical debridement of nails 1-5 bilaterally performed using a nail nipper. Filed with dremel without incident.  4. Medically necessary excisional debridement including subcutaneous tissue was performed using a tissue nipper and a chisel blade.  Excisional debridement of all the necrotic nonviable tissue down to healthy bleeding viable tissue was performed with post-debridement measurements same as pre-. 5. The wound was cleansed and dry sterile dressing applied. 6. Prescription for Gentamicin cream provided to patient to use daily with a bandage.  7. Return to clinic in 3 months.   Daughter is Genie.    Edrick Kins, DPM [facility]  Dr. Edrick Kins, [address]                                        [place], [place] [address]                Office [phone]  Fax [phone]

## 2019-06-16 NOTE — Telephone Encounter (Signed)
-----   Message from Birdie Sons, MD sent at 06/15/2019  4:36 PM EDT ----- Patient is extremely iron deficient. She needs to start taking OTC iron sulfate 325mg  once a day  She needs to have OC-lyte stool test done to see if she is losing blood through GI tract She had blood in her urine in march which was attributed to to UTI, but she needs to get another u/a to make sure blood has cleared.  Since most common source of blood loss is gi tract, she needs to start OTC Prilosec OTC to protect stomach.

## 2019-06-16 NOTE — Telephone Encounter (Signed)
Patient's daughter Loura Back advised and verbalized understanding. OC Light test kit and urine specimen cup placed up front for pick up.

## 2019-06-17 ENCOUNTER — Other Ambulatory Visit: Payer: Self-pay

## 2019-06-17 DIAGNOSIS — N390 Urinary tract infection, site not specified: Secondary | ICD-10-CM

## 2019-06-17 DIAGNOSIS — R319 Hematuria, unspecified: Secondary | ICD-10-CM

## 2019-06-17 LAB — POCT URINALYSIS DIPSTICK
Glucose, UA: NEGATIVE
Ketones, UA: NEGATIVE
Nitrite, UA: POSITIVE
Protein, UA: POSITIVE — AB
Spec Grav, UA: 1.015 (ref 1.010–1.025)
Urobilinogen, UA: 0.2 E.U./dL
pH, UA: 6.5 (ref 5.0–8.0)

## 2019-06-17 LAB — IFOBT (OCCULT BLOOD): IFOBT: POSITIVE

## 2019-06-17 MED ORDER — CEPHALEXIN 250 MG PO CAPS: 250.0000 mg | ORAL_CAPSULE | Freq: Three times a day (TID) | ORAL | 0 refills | Status: DC

## 2019-06-17 NOTE — Telephone Encounter (Addendum)
Urine sample and OC Light returned to office today. Urine culture sent off to lab. GI referral placed.  Please review results.

## 2019-06-17 NOTE — Addendum Note (Signed)
Addended by: Randal Buba on: 06/17/2019 12:07 PM   Modules accepted: Orders

## 2019-06-17 NOTE — Telephone Encounter (Signed)
Tried calling patient. Left message to call back. OK for PEC to advise patient's daughter when she returns call.

## 2019-06-17 NOTE — Telephone Encounter (Signed)
-----   Message from Birdie Sons, MD sent at 06/17/2019  1:51 PM EDT ----- She does have some blood in stool which is likely the cause of her anemia and iron deficiency. She should stay on omeprazole for the time being and needs referral to GI. She also still has UTI.  Need to take cephalexin 250mg  three times daily for 10 days.

## 2019-06-17 NOTE — Telephone Encounter (Signed)
Daughter Edmonia Lynch returned call.  Advised of note from Dr. Caryn Section that pt. does have blood in stool, that could be cause of anemia and iron deficiency.  Advised, per Dr. Caryn Section, to continue taking Omeprazole, and that will place referral to GI.  Also informed that pt. still has UTI; advised Cephalexin 250 mg. TID x 10 days, prescribed.  Daughter verb. Understanding.

## 2019-06-21 LAB — URINE CULTURE

## 2019-06-30 NOTE — Progress Notes (Signed)
Established patient visit   Patient: [name] [name]   DOB: [birth year]   86 y.o. Female  MRN: [id number] Visit Date: 07/01/2019  Today's healthcare provider: [name], MD   Chief Complaint  Patient presents with  . Follow-up    Labs   Subjective    HPI Abnormal Labs:  Patient presents for a 5 week follow up. Last labs were done on 06/13/2019 results showed muscle enzyme were fairly high with total CK of 455 , indicating injury or inflammation to a muscle. Patient recommended to stop the simvastatin for the time being. Patient reports good compliance with treatment plan. Labs also showed patient was anemic. She had positive stool occult blood.  She was advised to start iron supplements and avoid OTC NSAIDS. She reports good compliance with treatment plan.   She states she has had some mild stomach pains. She has also been seeing urologist for cystocele and her daughter reports this has cause some bleeding that may have contaminated the stool samples.   Overall she does feel like her energy level has improved.      Medications: Outpatient Medications Prior to Visit  Medication Sig  . allopurinol (ZYLOPRIM) 100 MG tablet TAKE TWO TABLETS EVERY DAY  . amLODipine (NORVASC) 10 MG tablet TAKE ONE TABLET BY MOUTH DAILY  . CALCIUM CARBONATE-VITAMIN D PO Take 2 tablets by mouth daily.   Marland Kitchen docusate sodium (COLACE) 100 MG capsule Take 1 capsule (100 mg total) by mouth every evening.  Marland Kitchen gentamicin cream (GARAMYCIN) 0.1 % Apply 1 application topically 2 (two) times daily.  Marland Kitchen levothyroxine (SYNTHROID) 75 MCG tablet TAKE 1 TABLET EVERY DAY ON EMPTY STOMACHWITH A GLASS OF WATER AT LEAST 30-60 MINBEFORE BREAKFAST  . Multiple Vitamins-Minerals (PRESERVISION AREDS 2 PO) Take 2 tablets by mouth daily.  . naproxen sodium (ALEVE) 220 MG tablet Take 220 mg by mouth.  . valsartan-hydrochlorothiazide (DIOVAN-HCT) 320-25 MG tablet TAKE ONE TABLET BY MOUTH EVERY DAY  . cephALEXin (KEFLEX) 250 MG  capsule Take 1 capsule (250 mg total) by mouth 3 (three) times daily. (Patient not taking: Reported on 07/01/2019)  . conjugated estrogens (PREMARIN) vaginal cream Apply every 2nd day (Patient not taking: Reported on 06/13/2019)  . ibuprofen (ADVIL,MOTRIN) 600 MG tablet Take by mouth every 6 (six) hours as needed.   . simvastatin (ZOCOR) 20 MG tablet TAKE ONE TABLET EVERY DAY (Patient not taking: Reported on 07/01/2019)   No facility-administered medications prior to visit.    Review of Systems  Constitutional: Negative.  Negative for appetite change, chills, fatigue and fever.  Respiratory: Negative.  Negative for chest tightness and shortness of breath.   Cardiovascular: Negative.  Negative for chest pain and palpitations.  Gastrointestinal: Negative for abdominal pain, nausea and vomiting.  Musculoskeletal: Negative.   Neurological: Negative for dizziness and weakness.      Objective    BP (!) 153/69 (BP Location: Left Arm, Patient Position: Sitting, Cuff Size: Large)   Pulse 76   Temp (!) 97.5 F (36.4 C) (Other (Comment))   Resp 18   Ht 4\' 10"  (1.473 m)   Wt 165 lb (74.8 kg)   SpO2 97%   BMI 34.49 kg/m    Physical Exam  General appearance: Obese female, cooperative and in no acute distress Head: Normocephalic, without obvious abnormality, atraumatic Respiratory: Respirations even and unlabored, normal respiratory rate Extremities: All extremities are intact.  Skin: Skin color, texture, turgor normal. No rashes seen  Psych: Appropriate mood and affect. Neurologic:  Mental status: Alert, oriented to person, place, and time, thought content appropriate.   No results found for any visits on 07/01/19.  Assessment & Plan     1. Iron deficiency anemia, unspecified iron deficiency anemia type Unclear if this related to GU bleeding or GI bleeding, although OC-Lyte was positive. Now off of daily NSAID and on iron supplement.  - CBC  2. Elevated creatine kinase Now off of  statin. Consider non-statin therapy when CK normalizes.  - CK (Creatine Kinase)  3. Abdominal pain, unspecified abdominal location  - H. pylori breath test  4. Hypercholesterolemia without hypertriglyceridemia Off simvastatin due to elevated CK.  - Lipid panel   No follow-ups on file.      The entirety of the information documented in the History of Present Illness, Review of Systems and Physical Exam were personally obtained by me. Portions of this information were initially documented by the CMA and reviewed by me for thoroughness and accuracy.      [name], MD  [facility] [phone] (phone) [phone] (fax)  [place]

## 2019-07-01 ENCOUNTER — Ambulatory Visit (INDEPENDENT_AMBULATORY_CARE_PROVIDER_SITE_OTHER): Payer: PPO | Admitting: Family Medicine

## 2019-07-01 ENCOUNTER — Other Ambulatory Visit: Payer: Self-pay

## 2019-07-01 ENCOUNTER — Other Ambulatory Visit: Payer: Self-pay | Admitting: Family Medicine

## 2019-07-01 ENCOUNTER — Encounter: Payer: Self-pay | Admitting: Family Medicine

## 2019-07-01 VITALS — BP 153/69 | HR 76 | Temp 97.5°F | Resp 18 | Ht <= 58 in | Wt 165.0 lb

## 2019-07-01 DIAGNOSIS — E7800 Pure hypercholesterolemia, unspecified: Secondary | ICD-10-CM | POA: Diagnosis not present

## 2019-07-01 DIAGNOSIS — D509 Iron deficiency anemia, unspecified: Secondary | ICD-10-CM | POA: Diagnosis not present

## 2019-07-01 DIAGNOSIS — R748 Abnormal levels of other serum enzymes: Secondary | ICD-10-CM

## 2019-07-01 DIAGNOSIS — R109 Unspecified abdominal pain: Secondary | ICD-10-CM | POA: Diagnosis not present

## 2019-07-01 NOTE — Addendum Note (Signed)
Addended by: Julieta Bellini on: 07/01/2019 11:57 AM   Modules accepted: Orders

## 2019-07-06 ENCOUNTER — Telehealth: Payer: Self-pay

## 2019-07-06 DIAGNOSIS — A048 Other specified bacterial intestinal infections: Secondary | ICD-10-CM

## 2019-07-06 LAB — LIPID PANEL
Chol/HDL Ratio: 3.3 ratio (ref 0.0–4.4)
Cholesterol, Total: 173 mg/dL (ref 100–199)
HDL: 53 mg/dL (ref >39)
LDL Chol Calc (NIH): 104 mg/dL — ABNORMAL HIGH (ref 0–99)
Triglycerides: 84 mg/dL (ref 0–149)
VLDL Cholesterol Cal: 16 mg/dL (ref 5–40)

## 2019-07-06 LAB — CBC
Hematocrit: 29.5 % — ABNORMAL LOW (ref 34.0–46.6)
Hemoglobin: 8.9 g/dL — ABNORMAL LOW (ref 11.1–15.9)
MCH: 24.9 pg — ABNORMAL LOW (ref 26.6–33.0)
MCHC: 30.2 g/dL — ABNORMAL LOW (ref 31.5–35.7)
MCV: 83 fL (ref 79–97)
Platelets: 201 10*3/uL (ref 150–450)
RBC: 3.57 x10E6/uL — ABNORMAL LOW (ref 3.77–5.28)
RDW: 13.4 % (ref 11.7–15.4)
WBC: 7.7 10*3/uL (ref 3.4–10.8)

## 2019-07-06 LAB — H PYLORI, IGM, IGG, IGA AB
H pylori, IgM Abs: <9 units (ref 0.0–8.9)
H. pylori, IgA Abs: 13 units — ABNORMAL HIGH (ref 0.0–8.9)
H. pylori, IgG AbS: 0.71 Index Value (ref 0.00–0.79)

## 2019-07-06 LAB — CK: Total CK: 189 U/L — ABNORMAL HIGH (ref 26–161)

## 2019-07-06 MED ORDER — CLARITHROMYCIN 500 MG PO TABS: 500.0000 mg | ORAL_TABLET | Freq: Two times a day (BID) | ORAL | 0 refills | Status: AC

## 2019-07-06 MED ORDER — OMEPRAZOLE 20 MG PO CPDR: 20.0000 mg | DELAYED_RELEASE_CAPSULE | Freq: Two times a day (BID) | ORAL | 0 refills | Status: DC

## 2019-07-06 MED ORDER — AMOXICILLIN 500 MG PO TABS: 500.0000 mg | ORAL_TABLET | Freq: Two times a day (BID) | ORAL | 0 refills | Status: AC

## 2019-07-06 NOTE — Telephone Encounter (Signed)
-----   Message from Birdie Sons, MD sent at 07/06/2019  3:22 PM EDT ----- The final h. Pylori test came back positive. Recommend she start amoxicillin 500mg  two tablets twice a day for 10 days. Clarithromycin 500mg  twice a day for 10 days, and omeprazole 20mg  one tablet twice daily for 10 days.

## 2019-07-06 NOTE — Telephone Encounter (Signed)
Patient's daughter Loura Back advised and verbalized understanding. She agrees with treatment plan. Prescriptions sent into pharmacy.

## 2019-07-13 ENCOUNTER — Ambulatory Visit: Payer: Self-pay | Admitting: Family Medicine

## 2019-07-25 ENCOUNTER — Ambulatory Visit: Payer: PPO | Admitting: Urology

## 2019-07-25 ENCOUNTER — Encounter: Payer: Self-pay | Admitting: Urology

## 2019-07-25 ENCOUNTER — Other Ambulatory Visit: Payer: Self-pay

## 2019-07-25 VITALS — BP 131/67 | HR 80

## 2019-07-25 DIAGNOSIS — N3946 Mixed incontinence: Secondary | ICD-10-CM

## 2019-07-25 NOTE — Progress Notes (Signed)
07/25/2019 10:19 AM   [name] [name] December 27, 1926 [id number]  Referring provider: Birdie Sons, [address] [place],  [place] [address]  Chief Complaint  Patient presents with  . Follow-up    HPI: I was consulted to assess the patient for a possible ulcer or vaginal bleeding associated with prolapse.  She has had the area associated with blood for at least a month.  She wears 2 pads a day that are quite wet but the history was limited but her family member was helpful today.Family has been applying Desitin  She has had a hysterectomy.  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  I did not do an extensive examination due to her lack of mobility.  It appeared that the cuff was just exiting the introitus 2 cm.  There was a large superficial ulcer 3 x 2 cm.  Patient has urinary incontinence.  I think we need to have reasonable expectations regarding this.  She has had 2 recent bladder infections documented.  I am not going to place the patient on urinary prophylaxis at this stage.  I mention to the family member that this is something we sometimes do if someone is having too many bladder infections.  The history was difficult.  The role of estrogen cream once a day with the Desitin cream once a day discussed.  Watchful waiting for incontinence.  Change pads frequently.  The Desitin will be used twice a day every second day and the estrogen will be used every second day in the morning with the Desitin at night.  Reevaluate 8 weeks.  Samples and prescription given  Today Patient had allergic reaction to the Estrace cream.  Still uses Desitin twice a day and no more bleeding.  Still has incontinence.  We discussed incontinence and chose watchful waiting again.  Clinically not infected.  I clarified she is not a candidate for a pessary for her incontinence which would not help and one could  not place it because of the ulcer   PMH: Past Medical History:  Diagnosis Date  . Hyperlipidemia   . Hypertension     Surgical History: Past Surgical History:  Procedure Laterality Date  . ABDOMINAL HYSTERECTOMY  1995   abdominal-uterine prolapse, 1 ovary removed, bladder tact  . CARDIAC CATHETERIZATION    . CHOLECYSTECTOMY    . TONSILLECTOMY AND ADENOIDECTOMY      Home Medications:  Allergies as of 07/25/2019      Reactions   Atorvastatin    myalgias/arthralgias.   Lovastatin    Other reaction(s): Muscle Pain   Simvastatin    Elevated CK      Medication List       Accurate as of July 25, 2019 10:19 AM. If you have any questions, ask your nurse or doctor.        allopurinol 100 MG tablet Commonly known as: ZYLOPRIM TAKE TWO TABLETS EVERY DAY   amLODipine 10 MG tablet Commonly known as: NORVASC TAKE ONE TABLET BY MOUTH DAILY   CALCIUM CARBONATE-VITAMIN D PO Take 2 tablets by mouth daily.   docusate sodium 100 MG capsule Commonly known as: COLACE Take 1 capsule (100 mg total) by mouth every evening.   gentamicin cream 0.1 % Commonly known as: GARAMYCIN Apply 1 application topically 2 (two) times daily.   ibuprofen 600 MG tablet Commonly known as: ADVIL Take by mouth every 6 (  six) hours as needed.   levothyroxine 75 MCG tablet Commonly known as: SYNTHROID TAKE 1 TABLET EVERY DAY ON EMPTY STOMACHWITH A GLASS OF WATER AT LEAST 30-60 MINBEFORE BREAKFAST   naproxen sodium 220 MG tablet Commonly known as: ALEVE Take 220 mg by mouth.   omeprazole 20 MG capsule Commonly known as: PRILOSEC Take 1 capsule (20 mg total) by mouth in the morning and at bedtime for 10 days.   Premarin vaginal cream Generic drug: conjugated estrogens Apply every 2nd day   PRESERVISION AREDS 2 PO Take 2 tablets by mouth daily.   valsartan-hydrochlorothiazide 320-25 MG tablet Commonly known as: DIOVAN-HCT TAKE ONE TABLET BY MOUTH EVERY DAY       Allergies:  Allergies    Allergen Reactions  . Atorvastatin     myalgias/arthralgias.  . Lovastatin     Other reaction(s): Muscle Pain  . Simvastatin     Elevated CK    Family History: Family History  Problem Relation Age of Onset  . Ovarian cancer Mother   . Alcohol abuse Sister     Social History:  reports that she quit smoking about 19 years ago. Her smoking use included cigarettes. She has never used smokeless tobacco. She reports that she does not drink alcohol or use drugs.  ROS:                                        Physical Exam: There were no vitals taken for this visit.  Constitutional:  Alert and oriented, No acute distress.   Laboratory Data: Lab Results  Component Value Date   WBC 7.7 07/01/2019   HGB 8.9 (L) 07/01/2019   HCT 29.5 (L) 07/01/2019   MCV 83 07/01/2019   PLT 201 07/01/2019    Lab Results  Component Value Date   CREATININE 1.14 (H) 06/13/2019    No results found for: PSA  No results found for: TESTOSTERONE  Lab Results  Component Value Date   HGBA1C 5.3 06/13/2019    Urinalysis    Component Value Date/Time   BILIRUBINUR small 06/17/2019 1204   PROTEINUR Positive (A) 06/17/2019 1204   UROBILINOGEN 0.2 06/17/2019 1204   NITRITE positive 06/17/2019 1204   LEUKOCYTESUR Large (3+) (A) 06/17/2019 1204    Pertinent Imaging:   Assessment & Plan: See as needed  There are no diagnoses linked to this encounter.  No follow-ups on file.  Reece Packer, MD  [facility] [address], [place], [place] [address] [phone]

## 2019-07-29 DIAGNOSIS — H353211 Exudative age-related macular degeneration, right eye, with active choroidal neovascularization: Secondary | ICD-10-CM | POA: Diagnosis not present

## 2019-08-07 ENCOUNTER — Other Ambulatory Visit: Payer: Self-pay | Admitting: Family Medicine

## 2019-08-08 NOTE — Progress Notes (Signed)
Subjective:   [name] [name] is a 86 y.o. female who presents for Medicare Annual (Subsequent) preventive examination.  I connected with [name] [name] today by telephone and verified that I am speaking with the correct person using two identifiers. Location patient: home Location provider: work Persons participating in the virtual visit: patient, provider.   I discussed the limitations, risks, security and privacy concerns of performing an evaluation and management service by telephone and the availability of in person appointments. I also discussed with the patient that there may be a patient responsible charge related to this service. The patient expressed understanding and verbally consented to this telephonic visit.    Interactive audio and video telecommunications were attempted between this provider and patient, however failed, due to patient having technical difficulties OR patient did not have access to video capability.  We continued and completed visit with audio only.  Review of Systems    N/A  Cardiac Risk Factors include: advanced age (>90men, >87 women);hypertension     Objective:    There were no vitals filed for this visit. There is no height or weight on file to calculate BMI.  Advanced Directives 08/09/2019 08/05/2016 10/09/2014  Does Patient Have a Medical Advance Directive? Yes Yes Yes  Type of Paramedic of [place];Living will [place];Living will [place];Living will  Does patient want to make changes to medical advance directive? - - No - Patient declined  Copy of [place] in Chart? No - copy requested No - copy requested No - copy requested    Current Medications (verified) Outpatient Encounter Medications as of 08/09/2019  Medication Sig  . allopurinol (ZYLOPRIM) 100 MG tablet TAKE TWO TABLETS EVERY DAY  . amLODipine (NORVASC) 10 MG tablet TAKE ONE TABLET BY MOUTH DAILY    . CALCIUM CARBONATE-VITAMIN D PO Take 1 tablet by mouth daily.   Marland Kitchen docusate sodium (COLACE) 100 MG capsule Take 1 capsule (100 mg total) by mouth every evening. (Patient taking differently: Take 100 mg by mouth daily as needed. )  . levothyroxine (SYNTHROID) 75 MCG tablet TAKE 1 TABLET EVERY DAY ON EMPTY STOMACHWITH A GLASS OF WATER AT LEAST 30-60 MINBEFORE BREAKFAST  . Multiple Vitamins-Minerals (PRESERVISION AREDS 2 PO) Take 2 tablets by mouth daily.  . valsartan-hydrochlorothiazide (DIOVAN-HCT) 320-25 MG tablet TAKE ONE TABLET BY MOUTH EVERY DAY  . ibuprofen (ADVIL,MOTRIN) 600 MG tablet Take by mouth every 6 (six) hours as needed.  (Patient not taking: Reported on 08/09/2019)  . naproxen sodium (ALEVE) 220 MG tablet Take 220 mg by mouth. (Patient not taking: Reported on 08/09/2019)   No facility-administered encounter medications on file as of 08/09/2019.    Allergies (verified) Atorvastatin, Lovastatin, and Simvastatin   History: Past Medical History:  Diagnosis Date  . Hyperlipidemia   . Hypertension    Past Surgical History:  Procedure Laterality Date  . ABDOMINAL HYSTERECTOMY  1995   abdominal-uterine prolapse, 1 ovary removed, bladder tact  . CARDIAC CATHETERIZATION    . CHOLECYSTECTOMY    . TONSILLECTOMY AND ADENOIDECTOMY     Family History  Problem Relation Age of Onset  . Ovarian cancer Mother   . Alcohol abuse Sister   . Alzheimer's disease Sister    Social History   Socioeconomic History  . Marital status: Widowed    Spouse name: Not on file  . Number of children: 5  . Years of education: Not on file  . Highest education level: Some  college, no degree  Occupational History  . Occupation: Retired  Tobacco Use  . Smoking status: Former Smoker    Types: Cigarettes    Quit date: 02/18/2000    Years since quitting: 19.4  . Smokeless tobacco: Never Used  Vaping Use  . Vaping Use: Never used  Substance and Sexual Activity  . Alcohol use: No    Alcohol/week:  0.0 standard drinks  . Drug use: No  . Sexual activity: Never  Other Topics Concern  . Not on file  Social History Narrative  . Not on file   Social Determinants of Health   Financial Resource Strain: Low Risk   . Difficulty of Paying Living Expenses: Not hard at all  Food Insecurity: No Food Insecurity  . Worried About Charity fundraiser in the Last Year: Never true  . Ran Out of Food in the Last Year: Never true  Transportation Needs: No Transportation Needs  . Lack of Transportation (Medical): No  . Lack of Transportation (Non-Medical): No  Physical Activity: Inactive  . Days of Exercise per Week: 0 days  . Minutes of Exercise per Session: 0 min  Stress: No Stress Concern Present  . Feeling of Stress : Not at all  Social Connections: Socially Isolated  . Frequency of Communication with Friends and Family: Once a week  . Frequency of Social Gatherings with Friends and Family: More than three times a week  . Attends Religious Services: Never  . Active Member of Clubs or Organizations: No  . Attends Archivist Meetings: Never  . Marital Status: Widowed    Tobacco Counseling Counseling given: Not Answered   Clinical Intake:  Pre-visit preparation completed: Yes  Pain : No/denies pain     Nutritional Risks: None Diabetes: No  How often do you need to have someone help you when you read instructions, pamphlets, or other written materials from your doctor or pharmacy?: 1 - Never  Diabetic? No  Interpreter Needed?: No  Information entered by :: [facility], LPN   Activities of Daily Living In your present state of health, do you have any difficulty performing the following activities: 08/09/2019 06/13/2019  Hearing? [name] [name]  Comment Wears bilateral hearing aids. -  Vision? 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 N Y  Walking or climbing stairs? Y Y  Comment Avoids steps. -   Dressing or bathing? N N  Doing errands, shopping? Y Y  Comment Does not drive. -  Preparing Food and eating ? N -  Using the Toilet? N -  In the past six months, have you accidently leaked urine? Y -  Comment Prolapsed bladder. Wears protection daily. -  Do you have problems with loss of bowel control? Y -  Comment Occasionally, wears protection daily. -  Managing your Medications? Y -  Comment Daughter assists. -  Managing your Finances? Y -  Comment Daughter manages finances. -  Housekeeping or managing your Housekeeping? Y -  Comment Daughter does the housework. -  Some recent data might be hidden    Patient Care Team: Birdie Sons, MD as PCP - General (Family Medicine) Leandrew Koyanagi, MD as Referring Physician (Ophthalmology) Edrick Kins, DPM as Consulting Physician (Podiatry)  Indicate any recent Medical Services you may have received from other than [facility] providers in the past year (date may be approximate).     Assessment:  This is a routine wellness examination for [name]  Hearing/Vision screen No exam data present  Dietary issues and exercise activities discussed: Current Exercise Habits: The patient does not participate in regular exercise at present, Exercise limited by: orthopedic condition(s)  Goals    . Increase water intake     Recommend increasing water intake to 4 glasses a day.     Marland Kitchen LIFESTYLE - DECREASE FALLS RISK     Recommend to remove any items from the home that may cause slips or trips.      Depression Screen PHQ 2/9 Scores 08/09/2019 06/13/2019 04/05/2018 07/28/2017 08/05/2016 01/23/2016 10/16/2014  PHQ - 2 Score 0 0 0 0 0 0 0    Fall Risk Fall Risk  08/09/2019 06/13/2019 04/05/2018 08/05/2016 01/23/2016  Falls in the past year? 1 0 1 Yes No  Number falls in past yr: 1 0 1 1 -  Comment - - - - -  Injury with Fall? 0 0 0 No -  Comment only bruising - - - -  Risk Factor Category  - - - - -  Risk for fall due to : Impaired  balance/gait;Impaired mobility;Impaired vision - - - -  Follow up Falls prevention discussed Falls evaluation completed - Falls prevention discussed -    Any stairs in or around the home? Yes  If so, are there any without handrails? No  Home free of loose throw rugs in walkways, pet beds, electrical cords, etc? Yes  Adequate lighting in your home to reduce risk of falls? Yes   ASSISTIVE DEVICES UTILIZED TO PREVENT FALLS:  Life alert? No  Use of a cane, walker or w/c? Yes  Grab bars in the bathroom? Yes  Shower chair or bench in shower? No  Elevated toilet seat or a handicapped toilet? Yes    Cognitive Function: Declined today.     6CIT Screen 08/05/2016  What Year? 0 points  What month? 0 points  What time? 0 points  Count back from 20 0 points  Months in reverse 4 points  Repeat phrase 2 points  Total Score 6    Immunizations Immunization History  Administered Date(s) Administered  . Influenza Split 01/22/2015  . Influenza, High Dose Seasonal PF 12/21/2015, 12/23/2016, 11/30/2017, 11/25/2018  . Pneumococcal Conjugate-13 04/10/2014  . Pneumococcal Polysaccharide-23 12/28/2008  . Tdap 04/29/2010  . Zoster 04/29/2010    TDAP status: Up to date Flu Vaccine status: Up to date Pneumococcal vaccine status: Up to date Covid-19 vaccine status: Completed vaccines  Qualifies for Shingles Vaccine? Yes   Zostavax completed No   Shingrix Completed?: No.    Education has been provided regarding the importance of this vaccine. Patient has been advised to call insurance company to determine out of pocket expense if they have not yet received this vaccine. Advised may also receive vaccine at local pharmacy or Health Dept. Verbalized acceptance and understanding.  Screening Tests Health Maintenance  Topic Date Due  . COVID-19 Vaccine (1) Never done  . DEXA SCAN  10/16/2018  . INFLUENZA VACCINE  09/18/2019  . TETANUS/TDAP  04/28/2020  . PNA vac Low Risk Adult  Completed     Health Maintenance  Health Maintenance Due  Topic Date Due  . COVID-19 Vaccine (1) Never done  . DEXA SCAN  10/16/2018    Colorectal cancer screening: No longer required.  Mammogram status: No longer required.  Bone Density status: Completed 10/15/16. Results reflect: Bone density results: OSTEOPENIA. Repeat every 2 years. Declined order today.  Lung Cancer Screening: (Low Dose CT Chest recommended if Age 50-80 years, 30 pack-year currently smoking OR have quit w/in 15years.) does not qualify.   Additional Screening:  Vision Screening: Recommended annual ophthalmology exams for early detection of glaucoma and other disorders of the eye. Is the patient up to date with their annual eye exam?  Yes  Who is the provider or what is the name of the office in which the patient attends annual eye exams? Dr Wallace Going at [facility] If pt is not established with a provider, would they like to be referred to a provider to establish care? No .   Dental Screening: Recommended annual dental exams for proper oral hygiene  Community Resource Referral / Chronic Care Management: CRR required this visit?  No   CCM required this visit?  No      Plan:   I have personally reviewed and noted the following in the patient's chart:   . Medical and social history . Use of alcohol, tobacco or illicit drugs  . Current medications and supplements . Functional ability and status . Nutritional status . Physical activity . Advanced directives . List of other physicians . Hospitalizations, surgeries, and ER visits in previous 12 months . Vitals . Screenings to include cognitive, depression, and falls . Referrals and appointments  In addition, I have reviewed and discussed with patient certain preventive protocols, quality metrics, and best practice recommendations. A written personalized care plan for preventive services as well as general preventive health recommendations were provided to patient.      [name] [name], [place]   4[birth year]51   Nurse Notes: Declined a DEXA order at this time. Pt to bring in COVID vaccine card to next apt.

## 2019-08-09 ENCOUNTER — Ambulatory Visit (INDEPENDENT_AMBULATORY_CARE_PROVIDER_SITE_OTHER): Payer: PPO

## 2019-08-09 ENCOUNTER — Other Ambulatory Visit: Payer: Self-pay

## 2019-08-09 DIAGNOSIS — Z0000 Encounter for general adult medical examination without abnormal findings: Secondary | ICD-10-CM

## 2019-08-09 NOTE — Patient Instructions (Signed)
[name] [name] , Thank you for taking time to come for your Medicare Wellness Visit. I appreciate your ongoing commitment to your health goals. Please review the following plan we discussed and let me know if I can assist you in the future.   Screening recommendations/referrals: Colonoscopy: No longer required.  Mammogram: No longer required.  Bone Density: Currently due. Pt declined a future order today.  Recommended yearly ophthalmology/optometry visit for glaucoma screening and checkup Recommended yearly dental visit for hygiene and checkup  Vaccinations: Influenza vaccine: Done 11/25/18 Pneumococcal vaccine: Completed series Tdap vaccine: Up to date, due 04/2020 Shingles vaccine: Currently due. Declined today.     Advanced directives: Please bring a copy of your POA (Power of Attorney) and/or Living Will to your next appointment.   Conditions/risks identified: Fall risk preventatives discussed today. Recommend to increase water intake to 6-8 8 oz glasses a day.   Next appointment: 09/06/19 @ 1:40 PM with Dr Caryn Section . Declined scheduling an AWV for 2022 at this time.   AVS to be picked up at next in office apt.    Preventive Care 11 Years and Older, Female Preventive care refers to lifestyle choices and visits with your health care provider that can promote health and wellness. What does preventive care include?  A yearly physical exam. This is also called an annual well check.  Dental exams once or twice a year.  Routine eye exams. Ask your health care provider how often you should have your eyes checked.  Personal lifestyle choices, including:  Daily care of your teeth and gums.  Regular physical activity.  Eating a healthy diet.  Avoiding tobacco and drug use.  Limiting alcohol use.  Practicing safe sex.  Taking low-dose aspirin every day.  Taking vitamin and mineral supplements as recommended by your health care provider. What happens during an annual well  check? The services and screenings done by your health care provider during your annual well check will depend on your age, overall health, lifestyle risk factors, and family history of disease. Counseling  Your health care provider may ask you questions about your:  Alcohol use.  Tobacco use.  Drug use.  Emotional well-being.  Home and relationship well-being.  Sexual activity.  Eating habits.  History of falls.  Memory and ability to understand (cognition).  Work and work Statistician.  Reproductive health. Screening  You may have the following tests or measurements:  Height, weight, and BMI.  Blood pressure.  Lipid and cholesterol levels. These may be checked every 5 years, or more frequently if you are over 62 years old.  Skin check.  Lung cancer screening. You may have this screening every year starting at age 64 if you have a 30-pack-year history of smoking and currently smoke or have quit within the past 15 years.  Fecal occult blood test (FOBT) of the stool. You may have this test every year starting at age 79.  Flexible sigmoidoscopy or colonoscopy. You may have a sigmoidoscopy every 5 years or a colonoscopy every 10 years starting at age 17.  Hepatitis C blood test.  Hepatitis B blood test.  Sexually transmitted disease (STD) testing.  Diabetes screening. This is done by checking your blood sugar (glucose) after you have not eaten for a while (fasting). You may have this done every 1-3 years.  Bone density scan. This is done to screen for osteoporosis. You may have this done starting at age 19.  Mammogram. This may be done every 1-2 years. Talk to  your health care provider about how often you should have regular mammograms. Talk with your health care provider about your test results, treatment options, and if necessary, the need for more tests. Vaccines  Your health care provider may recommend certain vaccines, such as:  Influenza vaccine. This is  recommended every year.  Tetanus, diphtheria, and acellular pertussis (Tdap, Td) vaccine. You may need a Td booster every 10 years.  Zoster vaccine. You may need this after age 62.  Pneumococcal 13-valent conjugate (PCV13) vaccine. One dose is recommended after age 72.  Pneumococcal polysaccharide (PPSV23) vaccine. One dose is recommended after age 74. Talk to your health care provider about which screenings and vaccines you need and how often you need them. This information is not intended to replace advice given to you by your health care provider. Make sure you discuss any questions you have with your health care provider. Document Released: 03/02/2015 Document Revised: 10/24/2015 Document Reviewed: 12/05/2014 Elsevier Interactive Patient Education  2017 [place] Prevention in the Home Falls can cause injuries. They can happen to people of all ages. There are many things you can do to make your home safe and to help prevent falls. What can I do on the outside of my home?  Regularly fix the edges of walkways and driveways and fix any cracks.  Remove anything that might make you trip as you walk through a door, such as a raised step or threshold.  Trim any bushes or trees on the path to your home.  Use bright outdoor lighting.  Clear any walking paths of anything that might make someone trip, such as rocks or tools.  Regularly check to see if handrails are loose or broken. Make sure that both sides of any steps have handrails.  Any raised decks and porches should have guardrails on the edges.  Have any leaves, snow, or ice cleared regularly.  Use sand or salt on walking paths during winter.  Clean up any spills in your garage right away. This includes oil or grease spills. What can I do in the bathroom?  Use night lights.  Install grab bars by the toilet and in the tub and shower. Do not use towel bars as grab bars.  Use non-skid mats or decals in the tub or  shower.  If you need to sit down in the shower, use a plastic, non-slip stool.  Keep the floor dry. Clean up any water that spills on the floor as soon as it happens.  Remove soap buildup in the tub or shower regularly.  Attach bath mats securely with double-sided non-slip rug tape.  Do not have throw rugs and other things on the floor that can make you trip. What can I do in the bedroom?  Use night lights.  Make sure that you have a light by your bed that is easy to reach.  Do not use any sheets or blankets that are too big for your bed. They should not hang down onto the floor.  Have a firm chair that has side arms. You can use this for support while you get dressed.  Do not have throw rugs and other things on the floor that can make you trip. What can I do in the kitchen?  Clean up any spills right away.  Avoid walking on wet floors.  Keep items that you use a lot in easy-to-reach places.  If you need to reach something above you, use a strong step stool that has  a grab bar.  Keep electrical cords out of the way.  Do not use floor polish or wax that makes floors slippery. If you must use wax, use non-skid floor wax.  Do not have throw rugs and other things on the floor that can make you trip. What can I do with my stairs?  Do not leave any items on the stairs.  Make sure that there are handrails on both sides of the stairs and use them. Fix handrails that are broken or loose. Make sure that handrails are as long as the stairways.  Check any carpeting to make sure that it is firmly attached to the stairs. Fix any carpet that is loose or worn.  Avoid having throw rugs at the top or bottom of the stairs. If you do have throw rugs, attach them to the floor with carpet tape.  Make sure that you have a light switch at the top of the stairs and the bottom of the stairs. If you do not have them, ask someone to add them for you. What else can I do to help prevent  falls?  Wear shoes that:  Do not have high heels.  Have rubber bottoms.  Are comfortable and fit you well.  Are closed at the toe. Do not wear sandals.  If you use a stepladder:  Make sure that it is fully opened. Do not climb a closed stepladder.  Make sure that both sides of the stepladder are locked into place.  Ask someone to hold it for you, if possible.  Clearly mark and make sure that you can see:  Any grab bars or handrails.  First and last steps.  Where the edge of each step is.  Use tools that help you move around (mobility aids) if they are needed. These include:  Canes.  Walkers.  Scooters.  Crutches.  Turn on the lights when you go into a dark area. Replace any light bulbs as soon as they burn out.  Set up your furniture so you have a clear path. Avoid moving your furniture around.  If any of your floors are uneven, fix them.  If there are any pets around you, be aware of where they are.  Review your medicines with your doctor. Some medicines can make you feel dizzy. This can increase your chance of falling. Ask your doctor what other things that you can do to help prevent falls. This information is not intended to replace advice given to you by your health care provider. Make sure you discuss any questions you have with your health care provider. Document Released: 11/30/2008 Document Revised: 07/12/2015 Document Reviewed: 03/10/2014 Elsevier Interactive Patient Education  2017 [employer].

## 2019-08-30 ENCOUNTER — Encounter: Payer: Self-pay | Admitting: Physician Assistant

## 2019-08-30 ENCOUNTER — Ambulatory Visit (INDEPENDENT_AMBULATORY_CARE_PROVIDER_SITE_OTHER): Payer: PPO | Admitting: Physician Assistant

## 2019-08-30 ENCOUNTER — Other Ambulatory Visit: Payer: Self-pay

## 2019-08-30 VITALS — BP 136/74 | HR 83 | Temp 97.1°F | Wt 145.0 lb

## 2019-08-30 DIAGNOSIS — R21 Rash and other nonspecific skin eruption: Secondary | ICD-10-CM | POA: Diagnosis not present

## 2019-08-30 MED ORDER — CLOTRIMAZOLE 1 % EX CREA: 1.0000 "application " | TOPICAL_CREAM | Freq: Two times a day (BID) | CUTANEOUS | 1 refills | Status: DC

## 2019-08-30 NOTE — Patient Instructions (Addendum)
Interdry sheets. Moisture wick fabric.

## 2019-08-30 NOTE — Progress Notes (Signed)
     Established patient visit   Patient: [name] [name]   DOB: 23-Sep-1926   86 y.o. Female  MRN: [id number] Visit Date: 08/30/2019  Today's healthcare provider: Trinna Post, PA-C   Chief Complaint  Patient presents with  . Rash   Subjective    HPI  Patient comes in today accompanied with her daughter. She reports that she has had a rash for the last month. Daughter reports she was using moisturizing cream and the rash was getting better but has since worsened with the heat. Rash is red, itchy and located under breast. Patient likes to sit outside in the sun frequently.      Medications: Outpatient Medications Prior to Visit  Medication Sig  . allopurinol (ZYLOPRIM) 100 MG tablet TAKE TWO TABLETS EVERY DAY  . amLODipine (NORVASC) 10 MG tablet TAKE ONE TABLET BY MOUTH DAILY  . CALCIUM CARBONATE-VITAMIN D PO Take 1 tablet by mouth daily.   Marland Kitchen docusate sodium (COLACE) 100 MG capsule Take 1 capsule (100 mg total) by mouth every evening. (Patient taking differently: Take 100 mg by mouth daily as needed. )  . levothyroxine (SYNTHROID) 75 MCG tablet TAKE 1 TABLET EVERY DAY ON EMPTY STOMACHWITH A GLASS OF WATER AT LEAST 30-60 MINBEFORE BREAKFAST  . Multiple Vitamins-Minerals (PRESERVISION AREDS 2 PO) Take 2 tablets by mouth daily.  . valsartan-hydrochlorothiazide (DIOVAN-HCT) 320-25 MG tablet TAKE ONE TABLET BY MOUTH EVERY DAY  . ibuprofen (ADVIL,MOTRIN) 600 MG tablet Take by mouth every 6 (six) hours as needed.  (Patient not taking: Reported on 08/09/2019)  . naproxen sodium (ALEVE) 220 MG tablet Take 220 mg by mouth. (Patient not taking: Reported on 08/09/2019)   No facility-administered medications prior to visit.    Review of Systems    Objective    BP 136/74   Pulse 83   Temp (!) 97.1 F (36.2 C)   Wt 145 lb (65.8 kg)   BMI 30.31 kg/m    Physical Exam Constitutional:      Appearance: Normal appearance.  Cardiovascular:     Rate and Rhythm: Normal rate and  regular rhythm.  Skin:    Findings: Rash present.       Neurological:     Mental Status: She is alert and oriented to person, place, and time. Mental status is at baseline.  Psychiatric:        Mood and Affect: Mood normal.        Behavior: Behavior normal.       No results found for any visits on 08/30/19.  Assessment & Plan    1. Rash  Consistent with Candida. Suggest cream for 2-4 weeks. May need antifungal orally if not improving.   - clotrimazole (CLOTRIMAZOLE ANTI-FUNGAL) 1 % cream; Apply 1 application topically 2 (two) times daily.  Dispense: 30 g; Refill: 1    Return if symptoms worsen or fail to improve.      ITrinna Post, PA-C, have reviewed all documentation for this visit. The documentation on 09/01/19 for the exam, diagnosis, procedures, and orders are all accurate and complete.    Paulene Floor  [facility] [phone] (phone) [phone] (fax)  [place]

## 2019-09-05 NOTE — Progress Notes (Signed)
I,Roshena L Chambers,acting as a scribe for [name], MD.,have documented all relevant documentation on the behalf of [name], MD,as directed by  [name], MD while in the presence of [name], MD.   Established patient visit   Patient: [name] [name]   DOB: [birth year]   86 y.o. Female  MRN: [id number] Visit Date: 09/06/2019  Today's healthcare provider: [name], MD   Chief Complaint  Patient presents with  . Anemia  . Follow-up   Subjective    HPI  Follow up for H. Pylori infection:  The patient was last seen for this 2 months ago. Labs were checked showing positive for H. Pylori. Patient was started amoxicillin 500mg  two tablets twice a day for 10 days. Clarithromycin 500mg  twice a day for 10 days, and omeprazole 20mg  one tablet twice daily for 10 days.  She reports good compliance with treatment. She feels that condition is Improved. She is not having side effects.   -----------------------------------------------------------------------------------------  Follow up for Anemia:  The patient was last seen for this 2 months ago. Management during that visit includes advising patient to continue oral iron supplement and follow up in 2 months to check blood count, cholesterol and stool test.   She reports poor compliance with treatment. Patient has not been taking any iron supplements for the last month, but did have any adverse effects from the medication.She feels that condition is Unchanged. She is not having side effects.   -----------------------------------------------------------------------------------------       Medications: Outpatient Medications Prior to Visit  Medication Sig  . allopurinol (ZYLOPRIM) 100 MG tablet TAKE TWO TABLETS EVERY DAY  . amLODipine (NORVASC) 10 MG tablet TAKE ONE TABLET BY MOUTH DAILY  . CALCIUM CARBONATE-VITAMIN D PO Take 1 tablet by mouth daily.   . clotrimazole (CLOTRIMAZOLE ANTI-FUNGAL) 1 %  cream Apply 1 application topically 2 (two) times daily.  Marland Kitchen docusate sodium (COLACE) 100 MG capsule Take 1 capsule (100 mg total) by mouth every evening. (Patient taking differently: Take 100 mg by mouth daily as needed. )  . levothyroxine (SYNTHROID) 75 MCG tablet TAKE 1 TABLET EVERY DAY ON EMPTY STOMACHWITH A GLASS OF WATER AT LEAST 30-60 MINBEFORE BREAKFAST  . Multiple Vitamins-Minerals (PRESERVISION AREDS 2 PO) Take 2 tablets by mouth daily.  . valsartan-hydrochlorothiazide (DIOVAN-HCT) 320-25 MG tablet TAKE ONE TABLET BY MOUTH EVERY DAY  . [DISCONTINUED] ibuprofen (ADVIL,MOTRIN) 600 MG tablet Take by mouth every 6 (six) hours as needed.  (Patient not taking: Reported on 08/09/2019)  . [DISCONTINUED] naproxen sodium (ALEVE) 220 MG tablet Take 220 mg by mouth. (Patient not taking: Reported on 08/09/2019)   No facility-administered medications prior to visit.    Review of Systems  Constitutional: Negative for appetite change, chills, fatigue and fever.  Respiratory: Negative for chest tightness and shortness of breath.   Cardiovascular: Negative for chest pain and palpitations.  Gastrointestinal: Negative for abdominal pain, nausea and vomiting.  Neurological: Negative for dizziness and weakness.      Objective    BP (!) 130/56 (BP Location: Left Arm, Patient Position: Sitting, Cuff Size: Normal)   Pulse 81   Temp (!) 96.8 F (36 C) (Temporal)   Resp 16   Wt 147 lb (66.7 kg)   SpO2 98% Comment: room air  BMI 30.72 kg/m    Physical Exam  General appearance: Mildly obese female, cooperative and in no acute distress Head: Normocephalic, without obvious abnormality, atraumatic Respiratory: Respirations even and unlabored, normal respiratory rate  No results found for any visits on 09/06/19.  Assessment & Plan     1. Iron deficiency anemia, unspecified iron deficiency anemia type  - CBC - Ferritin - Iron and TIBC   No follow-ups on file.      The entirety of the  information documented in the History of Present Illness, Review of Systems and Physical Exam were personally obtained by me. Portions of this information were initially documented by the CMA and reviewed by me for thoroughness and accuracy.      [name], MD  [facility] [phone] (phone) [phone] (fax)  [place]

## 2019-09-06 ENCOUNTER — Ambulatory Visit (INDEPENDENT_AMBULATORY_CARE_PROVIDER_SITE_OTHER): Payer: PPO | Admitting: Family Medicine

## 2019-09-06 ENCOUNTER — Encounter: Payer: Self-pay | Admitting: Family Medicine

## 2019-09-06 ENCOUNTER — Other Ambulatory Visit: Payer: Self-pay

## 2019-09-06 ENCOUNTER — Other Ambulatory Visit: Payer: Self-pay | Admitting: Family Medicine

## 2019-09-06 VITALS — BP 130/56 | HR 81 | Temp 96.8°F | Resp 16 | Wt 147.0 lb

## 2019-09-06 DIAGNOSIS — D509 Iron deficiency anemia, unspecified: Secondary | ICD-10-CM

## 2019-09-06 DIAGNOSIS — I10 Essential (primary) hypertension: Secondary | ICD-10-CM

## 2019-09-06 NOTE — Telephone Encounter (Signed)
Requested Prescriptions  Pending Prescriptions Disp Refills  . amLODipine (NORVASC) 10 MG tablet [Pharmacy Med Name: AMLODIPINE BESYLATE 10 MG TAB] 90 tablet 1    Sig: TAKE ONE TABLET EVERY DAY     Cardiovascular:  Calcium Channel Blockers Passed - 09/06/2019  2:34 PM      Passed - Last BP in normal range    BP Readings from Last 1 Encounters:  09/06/19 (!) 130/56         Passed - Valid encounter within last 6 months    Recent Outpatient Visits          Today Iron deficiency anemia, unspecified iron deficiency anemia type   [place], [place]   1 week ago [place], [place]   2 months ago Iron deficiency anemia, unspecified iron deficiency anemia type   [facility] Birdie Sons, MD   2 months ago Right leg pain   [place], [place]   3 months ago [place], Fabio Bering [name], [place]

## 2019-09-07 ENCOUNTER — Other Ambulatory Visit (INDEPENDENT_AMBULATORY_CARE_PROVIDER_SITE_OTHER): Payer: PPO

## 2019-09-07 ENCOUNTER — Telehealth: Payer: Self-pay

## 2019-09-07 DIAGNOSIS — R195 Other fecal abnormalities: Secondary | ICD-10-CM

## 2019-09-07 DIAGNOSIS — D509 Iron deficiency anemia, unspecified: Secondary | ICD-10-CM | POA: Diagnosis not present

## 2019-09-07 LAB — IFOBT (OCCULT BLOOD): IFOBT: POSITIVE

## 2019-09-07 LAB — CBC
Hematocrit: 32.6 % — ABNORMAL LOW (ref 34.0–46.6)
Hemoglobin: 9.7 g/dL — ABNORMAL LOW (ref 11.1–15.9)
MCH: 24.1 pg — ABNORMAL LOW (ref 26.6–33.0)
MCHC: 29.8 g/dL — ABNORMAL LOW (ref 31.5–35.7)
MCV: 81 fL (ref 79–97)
Platelets: 185 10*3/uL (ref 150–450)
RBC: 4.03 x10E6/uL (ref 3.77–5.28)
RDW: 16.3 % — ABNORMAL HIGH (ref 11.7–15.4)
WBC: 7.6 10*3/uL (ref 3.4–10.8)

## 2019-09-07 LAB — IRON AND TIBC
Iron Saturation: 7 % — CL (ref 15–55)
Iron: 19 ug/dL — ABNORMAL LOW (ref 27–139)
Total Iron Binding Capacity: 278 ug/dL (ref 250–450)
UIBC: 259 ug/dL (ref 118–369)

## 2019-09-07 LAB — FERRITIN: Ferritin: 25 ng/mL (ref 15–150)

## 2019-09-07 NOTE — Telephone Encounter (Signed)
Patient's daughter advised as below. [name] [name] verbalizes understanding and is in agreement with treatment plan.

## 2019-09-07 NOTE — Telephone Encounter (Signed)
-----   Message from Birdie Sons, MD sent at 09/07/2019  7:38 AM EDT ----- Iron levels up a little bit, but still very low and is still anemic. Needs to start back on daily iron supplement. Please schedule follow up 4 months.

## 2019-09-14 ENCOUNTER — Telehealth: Payer: Self-pay

## 2019-09-14 MED ORDER — OMEPRAZOLE 20 MG PO CPDR: 20.0000 mg | DELAYED_RELEASE_CAPSULE | Freq: Every day | ORAL | 2 refills | Status: DC

## 2019-09-14 NOTE — Telephone Encounter (Signed)
-----   Message from Birdie Sons, MD sent at 09/14/2019  1:57 PM EDT ----- Stool is still positive for blood. She may have some gastritis. Before sending to GI, I would recommend trying prescription for omeprazole 20mg  once a day for 8 weeks, #30, rf x 2. Return to recheck iron levels and stool test in 2 months. If still positive at that time she may need upper and lower endoscopy.

## 2019-09-14 NOTE — Telephone Encounter (Signed)
Patient's daughter Edmonia Lynch advised. RX sent to [facility] pharmacy.

## 2019-09-20 ENCOUNTER — Ambulatory Visit: Payer: PPO | Admitting: Podiatry

## 2019-09-20 ENCOUNTER — Other Ambulatory Visit: Payer: Self-pay

## 2019-09-20 ENCOUNTER — Encounter: Payer: Self-pay | Admitting: Podiatry

## 2019-09-20 DIAGNOSIS — M79676 Pain in unspecified toe(s): Secondary | ICD-10-CM | POA: Diagnosis not present

## 2019-09-20 DIAGNOSIS — B351 Tinea unguium: Secondary | ICD-10-CM | POA: Diagnosis not present

## 2019-09-20 DIAGNOSIS — L989 Disorder of the skin and subcutaneous tissue, unspecified: Secondary | ICD-10-CM

## 2019-09-20 NOTE — Progress Notes (Signed)
   SUBJECTIVE Patient presents to office today complaining of elongated, thickened nails that cause pain while ambulating in shoes. She is unable to trim her own nails.  Patient states that the callus lesion to the right great toe has slowly recurred. She denies any pain or modifying factors. She has not had any treatment for the area. Patient is here for further evaluation and treatment.  Past Medical History:  Diagnosis Date  . Hyperlipidemia   . Hypertension     OBJECTIVE General Patient is awake, alert, and oriented x 3 and in no acute distress.  Derm hyperkeratotic callus noted to the plantar aspect of the IPJ right hallux.  There is also hyperkeratotic preulcerative callus lesions noted to the bilateral feet Skin is dry and supple bilateral. Nails are tender, long, thickened and dystrophic with subungual debris, consistent with onychomycosis, 1-5 bilateral. No signs of infection noted.  Vasc  DP and PT pedal pulses palpable bilaterally. Temperature gradient within normal limits.   Neuro Epicritic and protective threshold sensation grossly intact bilaterally.   Musculoskeletal Exam No symptomatic pedal deformities noted bilateral. Muscular strength within normal limits.  ASSESSMENT 1. Onychodystrophic nails 1-5 bilateral with hyperkeratosis of nails.  2. Onychomycosis of nail due to dermatophyte bilateral 3.  Preulcerative callus lesions bilateral feet  PLAN OF CARE 1. Patient evaluated today.  2. Instructed to maintain good pedal hygiene and foot care.  3. Mechanical debridement of nails 1-5 bilaterally performed using a nail nipper. Filed with dremel without incident.  4.  Excisional debridement of the preulcerative callus lesions was performed using a chisel blade without incident or bleeding.   5. Return to clinic in 3 months.   Daughter is Genie.    Edrick Kins, DPM [facility]  Dr. Edrick Kins, [place]                                         [place], [place] [address]                Office [phone]  Fax [phone]

## 2019-10-21 ENCOUNTER — Other Ambulatory Visit: Payer: Self-pay | Admitting: Family Medicine

## 2019-11-07 ENCOUNTER — Other Ambulatory Visit: Payer: Self-pay | Admitting: Family Medicine

## 2019-11-07 DIAGNOSIS — E039 Hypothyroidism, unspecified: Secondary | ICD-10-CM

## 2019-12-05 ENCOUNTER — Other Ambulatory Visit: Payer: Self-pay | Admitting: Family Medicine

## 2019-12-05 DIAGNOSIS — M1A9XX0 Chronic gout, unspecified, without tophus (tophi): Secondary | ICD-10-CM

## 2019-12-23 ENCOUNTER — Encounter: Payer: Self-pay | Admitting: Podiatry

## 2019-12-23 ENCOUNTER — Ambulatory Visit: Payer: PPO | Admitting: Podiatry

## 2019-12-23 ENCOUNTER — Other Ambulatory Visit: Payer: Self-pay

## 2019-12-23 DIAGNOSIS — B351 Tinea unguium: Secondary | ICD-10-CM

## 2019-12-23 DIAGNOSIS — L989 Disorder of the skin and subcutaneous tissue, unspecified: Secondary | ICD-10-CM

## 2019-12-23 DIAGNOSIS — M79676 Pain in unspecified toe(s): Secondary | ICD-10-CM

## 2019-12-23 NOTE — Progress Notes (Signed)
   SUBJECTIVE Patient presents to office today complaining of elongated, thickened nails that cause pain while ambulating in shoes. She is unable to trim her own nails.  Patient states that the callus lesion to the right great toe has slowly recurred. She denies any pain or modifying factors. She has not had any treatment for the area. Patient is here for further evaluation and treatment.  Past Medical History:  Diagnosis Date  . Hyperlipidemia   . Hypertension     OBJECTIVE General Patient is awake, alert, and oriented x 3 and in no acute distress.  Derm hyperkeratotic callus noted to the plantar aspect of the IPJ right hallux.  There is also hyperkeratotic preulcerative callus lesions noted to the bilateral feet Skin is dry and supple bilateral. Nails are tender, long, thickened and dystrophic with subungual debris, consistent with onychomycosis, 1-5 bilateral. No signs of infection noted.  Vasc  DP and PT pedal pulses palpable bilaterally. Temperature gradient within normal limits.   Neuro Epicritic and protective threshold sensation grossly intact bilaterally.   Musculoskeletal Exam No symptomatic pedal deformities noted bilateral. Muscular strength within normal limits.  ASSESSMENT 1. Onychodystrophic nails 1-5 bilateral with hyperkeratosis of nails.  2. Onychomycosis of nail due to dermatophyte bilateral 3.  Preulcerative callus lesions bilateral feet  PLAN OF CARE 1. Patient evaluated today.  2. Instructed to maintain good pedal hygiene and foot care.  3. Mechanical debridement of nails 1-5 bilaterally performed using a nail nipper. Filed with dremel without incident.  4.  Excisional debridement of the preulcerative callus lesions was performed using a chisel blade without incident or bleeding.   5. Return to clinic in 3 months.   Daughter is Genie.    Edrick Kins, DPM [facility]  Dr. Edrick Kins, [place]                                         [place], [place] [address]                Office [phone]  Fax [phone]

## 2020-01-10 ENCOUNTER — Ambulatory Visit (INDEPENDENT_AMBULATORY_CARE_PROVIDER_SITE_OTHER): Payer: PPO | Admitting: Family Medicine

## 2020-01-10 ENCOUNTER — Encounter: Payer: Self-pay | Admitting: Family Medicine

## 2020-01-10 ENCOUNTER — Other Ambulatory Visit: Payer: Self-pay

## 2020-01-10 VITALS — BP 139/85 | HR 65 | Temp 97.6°F | Resp 18 | Ht <= 58 in | Wt 147.0 lb

## 2020-01-10 DIAGNOSIS — R600 Localized edema: Secondary | ICD-10-CM | POA: Diagnosis not present

## 2020-01-10 DIAGNOSIS — D509 Iron deficiency anemia, unspecified: Secondary | ICD-10-CM | POA: Diagnosis not present

## 2020-01-10 DIAGNOSIS — I10 Essential (primary) hypertension: Secondary | ICD-10-CM | POA: Diagnosis not present

## 2020-01-10 DIAGNOSIS — R195 Other fecal abnormalities: Secondary | ICD-10-CM | POA: Diagnosis not present

## 2020-01-10 DIAGNOSIS — E039 Hypothyroidism, unspecified: Secondary | ICD-10-CM

## 2020-01-10 NOTE — Progress Notes (Signed)
I,April Miller,acting as a scribe for [name], MD.,have documented all relevant documentation on the behalf of [name], MD,as directed by  [name], MD while in the presence of [name], MD.   Established patient visit   Patient: [name] [name]   DOB: April 10, 1926   86 y.o. Female  MRN: [id number] Visit Date: 01/10/2020  Today's healthcare provider: [name], MD   Chief Complaint  Patient presents with  . Anemia  . Follow-up   Subjective    HPI  Iron deficiency anemia, unspecified iron deficiency anemia type From 09/06/2019-labs and IFOB checked showing-Stool is still positive for blood. She may have some gastritis. Before sending to GI, I would recommend trying prescription for omeprazole 20mg  once a day for 8 weeks, #30, rf x 2. Return to recheck iron levels and stool test in 2 months. If still positive at that time she may need upper and lower endoscopy.   Her primary complaint today is her legs remain very swollen and is difficult to walk.     Medications: Outpatient Medications Prior to Visit  Medication Sig  . allopurinol (ZYLOPRIM) 100 MG tablet TAKE TWO TABLETS EVERY DAY  . amLODipine (NORVASC) 10 MG tablet TAKE ONE TABLET EVERY DAY  . CALCIUM CARBONATE-VITAMIN D PO Take 1 tablet by mouth daily.   Marland Kitchen docusate sodium (COLACE) 100 MG capsule Take 1 capsule (100 mg total) by mouth every evening. (Patient taking differently: Take 100 mg by mouth daily as needed. )  . levothyroxine (SYNTHROID) 75 MCG tablet TAKE 1 TABLET EVERY DAY ON EMPTY STOMACHWITH A GLASS OF WATER AT LEAST 30-60 MINBEFORE BREAKFAST  . Multiple Vitamins-Minerals (PRESERVISION AREDS 2 PO) Take 2 tablets by mouth daily.  Marland Kitchen omeprazole (PRILOSEC) 20 MG capsule TAKE 1 CAPSULE BY MOUTH DAILY  . clotrimazole (CLOTRIMAZOLE ANTI-FUNGAL) 1 % cream Apply 1 application topically 2 (two) times daily. (Patient not taking: Reported on 01/10/2020)  . valsartan-hydrochlorothiazide (DIOVAN-HCT)  320-25 MG tablet TAKE ONE TABLET BY MOUTH EVERY DAY (Patient not taking: Reported on 01/10/2020)   No facility-administered medications prior to visit.    Review of Systems  Constitutional: Negative for appetite change, chills, fatigue and fever.  Respiratory: Negative for chest tightness and shortness of breath.   Cardiovascular: Negative for chest pain and palpitations.  Gastrointestinal: Negative for abdominal pain, nausea and vomiting.  Neurological: Negative for dizziness and weakness.      Objective    BP 139/85 (BP Location: Left Arm, Patient Position: Sitting, Cuff Size: Large)   Pulse 65   Temp 97.6 F (36.4 C) (Oral)   Resp 18   Ht 4\' 10"  (1.473 m)   Wt 147 lb (66.7 kg)   SpO2 98%   BMI 30.72 kg/m    Physical Exam  General appearance: Mildly obese female, cooperative and in no acute distress Head: Normocephalic, without obvious abnormality, atraumatic Respiratory: Respirations even and unlabored, normal respiratory rate Extremities: 3+ bilateral feet, ankle and lower leg edema.    No results found for any visits on 01/10/20.  Assessment & Plan     1. Iron deficiency anemia, unspecified iron deficiency anemia type Doing well on iron supplement and PPI - Iron, TIBC and Ferritin Panel - CBC  2. Occult blood positive stool   3. Hypothyroidism, unspecified type  - TSH - T4, free  4. Localized edema Anticipate reducing amlodipine and possible adding another diuretic.   - Renal function panel  5. Primary hypertension Well controlled.  Will likely need  to adjust medications due to edema.    No follow-ups on file.      The entirety of the information documented in the History of Present Illness, Review of Systems and Physical Exam were personally obtained by me. Portions of this information were initially documented by the CMA and reviewed by me for thoroughness and accuracy.      [name], MD  [facility] [phone]  (phone) [phone] (fax)  [place]

## 2020-01-11 ENCOUNTER — Telehealth: Payer: Self-pay

## 2020-01-11 DIAGNOSIS — E039 Hypothyroidism, unspecified: Secondary | ICD-10-CM

## 2020-01-11 DIAGNOSIS — I10 Essential (primary) hypertension: Secondary | ICD-10-CM

## 2020-01-11 LAB — RENAL FUNCTION PANEL
Albumin: 4.1 g/dL (ref 3.5–4.6)
BUN/Creatinine Ratio: 35 — ABNORMAL HIGH (ref 12–28)
BUN: 46 mg/dL — ABNORMAL HIGH (ref 10–36)
CO2: 23 mmol/L (ref 20–29)
Calcium: 10.1 mg/dL (ref 8.7–10.3)
Chloride: 103 mmol/L (ref 96–106)
Creatinine, Ser: 1.33 mg/dL — ABNORMAL HIGH (ref 0.57–1.00)
GFR calc Af Amer: 40 mL/min/{1.73_m2} — ABNORMAL LOW (ref >59)
GFR calc non Af Amer: 35 mL/min/{1.73_m2} — ABNORMAL LOW (ref >59)
Glucose: 78 mg/dL (ref 65–99)
Phosphorus: 3.3 mg/dL (ref 3.0–4.3)
Potassium: 4.1 mmol/L (ref 3.5–5.2)
Sodium: 141 mmol/L (ref 134–144)

## 2020-01-11 LAB — CBC
Hematocrit: 33.8 % — ABNORMAL LOW (ref 34.0–46.6)
Hemoglobin: 11.2 g/dL (ref 11.1–15.9)
MCH: 28.3 pg (ref 26.6–33.0)
MCHC: 33.1 g/dL (ref 31.5–35.7)
MCV: 85 fL (ref 79–97)
Platelets: 168 10*3/uL (ref 150–450)
RBC: 3.96 x10E6/uL (ref 3.77–5.28)
RDW: 14.5 % (ref 11.7–15.4)
WBC: 7.9 10*3/uL (ref 3.4–10.8)

## 2020-01-11 LAB — IRON,TIBC AND FERRITIN PANEL
Ferritin: 79 ng/mL (ref 15–150)
Iron Saturation: 17 % (ref 15–55)
Iron: 45 ug/dL (ref 27–139)
Total Iron Binding Capacity: 267 ug/dL (ref 250–450)
UIBC: 222 ug/dL (ref 118–369)

## 2020-01-11 LAB — TSH: TSH: 10.6 u[IU]/mL — ABNORMAL HIGH (ref 0.450–4.500)

## 2020-01-11 LAB — T4, FREE: Free T4: 0.95 ng/dL (ref 0.82–1.77)

## 2020-01-11 MED ORDER — LEVOTHYROXINE SODIUM 88 MCG PO TABS: 88.0000 ug | ORAL_TABLET | Freq: Every day | ORAL | 1 refills | Status: DC

## 2020-01-11 NOTE — Telephone Encounter (Signed)
-----   Message from Birdie Sons, MD sent at 01/11/2020  7:53 AM EST ----- Iron levels and blood count are MUCH better, back to normal. Continue iron supplement for now.  She is hyPOthyroid which may be aggravated swelling. Need to increase levothyroxine to 59mcg once a day, #30 rf x 1.  Can also reduce amlodipine to 5mg  once a day due to swelling, if she still has quite a few 10mg  tablets, she can cut them in half and just 1/2 tablet daily. Need to schedule follow up to check on swelling, thyroid, and blood pressure in 6 weeks.

## 2020-01-11 NOTE — Telephone Encounter (Signed)
Advised patients daughter of results. Medication sent into the pharmacy. Appt scheduled.

## 2020-01-17 MED ORDER — AMLODIPINE BESYLATE 10 MG PO TABS: 5.0000 mg | ORAL_TABLET | Freq: Every day | ORAL | Status: DC

## 2020-01-17 NOTE — Addendum Note (Signed)
Addended by: [name] E on: 01/17/2020 11:45 AM   Modules accepted: Orders

## 2020-01-27 DIAGNOSIS — H353122 Nonexudative age-related macular degeneration, left eye, intermediate dry stage: Secondary | ICD-10-CM | POA: Diagnosis not present

## 2020-02-23 ENCOUNTER — Telehealth (INDEPENDENT_AMBULATORY_CARE_PROVIDER_SITE_OTHER): Payer: PPO

## 2020-02-23 DIAGNOSIS — R195 Other fecal abnormalities: Secondary | ICD-10-CM

## 2020-02-23 DIAGNOSIS — Z1211 Encounter for screening for malignant neoplasm of colon: Secondary | ICD-10-CM | POA: Diagnosis not present

## 2020-02-23 LAB — IFOBT (OCCULT BLOOD): IFOBT: POSITIVE

## 2020-02-23 NOTE — Telephone Encounter (Signed)
Patient returned OC Light. Please review results.

## 2020-02-27 ENCOUNTER — Encounter: Payer: Self-pay | Admitting: Family Medicine

## 2020-02-27 ENCOUNTER — Ambulatory Visit (INDEPENDENT_AMBULATORY_CARE_PROVIDER_SITE_OTHER): Payer: PPO | Admitting: Family Medicine

## 2020-02-27 ENCOUNTER — Other Ambulatory Visit: Payer: Self-pay

## 2020-02-27 ENCOUNTER — Other Ambulatory Visit: Payer: Self-pay | Admitting: Family Medicine

## 2020-02-27 VITALS — BP 148/77 | HR 83 | Temp 97.4°F | Resp 20 | Wt 141.0 lb

## 2020-02-27 DIAGNOSIS — R195 Other fecal abnormalities: Secondary | ICD-10-CM | POA: Diagnosis not present

## 2020-02-27 DIAGNOSIS — R609 Edema, unspecified: Secondary | ICD-10-CM | POA: Diagnosis not present

## 2020-02-27 DIAGNOSIS — K5900 Constipation, unspecified: Secondary | ICD-10-CM | POA: Diagnosis not present

## 2020-02-27 DIAGNOSIS — E039 Hypothyroidism, unspecified: Secondary | ICD-10-CM

## 2020-02-27 DIAGNOSIS — I10 Essential (primary) hypertension: Secondary | ICD-10-CM

## 2020-02-27 DIAGNOSIS — M1A9XX0 Chronic gout, unspecified, without tophus (tophi): Secondary | ICD-10-CM

## 2020-02-27 DIAGNOSIS — D509 Iron deficiency anemia, unspecified: Secondary | ICD-10-CM

## 2020-02-27 MED ORDER — AMLODIPINE BESYLATE 5 MG PO TABS: 5.0000 mg | ORAL_TABLET | Freq: Every day | ORAL | 3 refills | Status: DC

## 2020-02-27 NOTE — Progress Notes (Signed)
Established patient visit   Patient: [name] [name]   DOB: [birth year]   87 y.o. Female  MRN: [id number] Visit Date: 02/27/2020  Today's healthcare provider: [name], MD   Chief Complaint  Patient presents with  . Hypertension  . Hypothyroidism   Subjective    HPI  Hypothyroid, follow-up  Lab Results  Component Value Date   TSH 10.600 (H) 01/10/2020   TSH 8.960 (H) 06/13/2019   TSH 3.790 08/11/2018   FREET4 0.95 01/10/2020   Wt Readings from Last 3 Encounters:  02/27/20 141 lb (64 kg)  01/10/20 147 lb (66.7 kg)  09/06/19 147 lb (66.7 kg)    She was last seen for hypothyroid 6 weeks ago.  Management since that visit includes increasing levothyroxine to 43mcg once a day. She reports good compliance with treatment. She is not having side effects.   Symptoms: No change in energy level No constipation  No diarrhea No heat / cold intolerance  No nervousness No palpitations  No weight changes    ----------------------------------------------------------------------------------------- Hypertension, follow-up  BP Readings from Last 3 Encounters:  02/27/20 (!) 148/77  01/10/20 139/85  09/06/19 (!) 130/56   Wt Readings from Last 3 Encounters:  02/27/20 141 lb (64 kg)  01/10/20 147 lb (66.7 kg)  09/06/19 147 lb (66.7 kg)     She was last seen for hypertension 6 weeks ago.  BP at that visit was 139/85. Management since that visit includes decreasing Amlodipine to 5mg  daily due to swelling.  She reports good compliance with treatment. She is not having side effects.  She is following a Regular diet. She is not exercising. She does not smoke.  Use of agents associated with hypertension: thyroid hormones.   Outside blood pressures are checked occasionally. Symptoms: No chest pain No chest pressure  No palpitations No syncope  Yes dyspnea No orthopnea  No paroxysmal nocturnal dyspnea Yes lower extremity edema   Pertinent labs: Lab Results   Component Value Date   CHOL 173 07/01/2019   HDL 53 07/01/2019   LDLCALC 104 (H) 07/01/2019   TRIG 84 07/01/2019   CHOLHDL 3.3 07/01/2019   Lab Results  Component Value Date   NA 141 01/10/2020   K 4.1 01/10/2020   CREATININE 1.33 (H) 01/10/2020   GFRNONAA 35 (L) 01/10/2020   GFRAA 40 (L) 01/10/2020   GLUCOSE 78 01/10/2020     The ASCVD Risk score (Goff DC Jr., et al., 2013) failed to calculate for the following reasons:   The 2013 ASCVD risk score is only valid for ages 78 to 73   --------------------------------------------------------------------------------------------------- She is also here for follow up iron deficiency anemia and heme positive stool. She feels much better on daily iron therapy with cbc and iron studies normalized when last checked in November. She had follow up OC [place] done last week which was positive. She is a little constipated, but not had any other GI issues. Her last colonoscopy was well over 10 years ago before moving from [place] to [place], which was done for screening and reportedly was normal.      Medications: Outpatient Medications Prior to Visit  Medication Sig  . allopurinol (ZYLOPRIM) 100 MG tablet TAKE TWO TABLETS EVERY DAY  . amLODipine (NORVASC) 10 MG tablet Take 0.5 tablets (5 mg total) by mouth daily.  Marland Kitchen CALCIUM CARBONATE-VITAMIN D PO Take 1 tablet by mouth daily.   . clotrimazole (CLOTRIMAZOLE ANTI-FUNGAL) 1 % cream Apply 1 application topically 2 (two) times  daily.  . docusate sodium (COLACE) 100 MG capsule Take 1 capsule (100 mg total) by mouth every evening. (Patient taking differently: Take 100 mg by mouth daily as needed.)  . levothyroxine (SYNTHROID) 88 MCG tablet Take 1 tablet (88 mcg total) by mouth daily before breakfast.  . Multiple Vitamins-Minerals (PRESERVISION AREDS 2 PO) Take 2 tablets by mouth daily.  Marland Kitchen omeprazole (PRILOSEC) 20 MG capsule TAKE 1 CAPSULE BY MOUTH DAILY  . valsartan-hydrochlorothiazide (DIOVAN-HCT)  320-25 MG tablet TAKE ONE TABLET BY MOUTH EVERY DAY   No facility-administered medications prior to visit.    Review of Systems  Constitutional: Negative for appetite change, chills, fatigue and fever.  Respiratory: Positive for shortness of breath (with exertion).   Cardiovascular: Positive for leg swelling (improved). Negative for chest pain and palpitations.  Gastrointestinal: Negative for abdominal pain, nausea and vomiting.  Neurological: Negative for dizziness and weakness.      Objective    BP (!) 148/77 (BP Location: Left Arm, Patient Position: Sitting, Cuff Size: Normal)   Pulse 83   Temp (!) 97.4 F (36.3 C) (Temporal)   Resp 20   Wt 141 lb (64 kg)   BMI 29.47 kg/m    Physical Exam   General appearance:  Overweight female, cooperative and in no acute distress Head: Normocephalic, without obvious abnormality, atraumatic Respiratory: Respirations even and unlabored, normal respiratory rate Extremities: All extremities are intact.     Assessment & Plan     1. Iron deficiency anemia, unspecified iron deficiency anemia type Doing well with iron supplement. Check   - CBC - Ferritin  2. Hypothyroidism, unspecified type  - TSH  3. Occult blood positive stool Long discussion about risks versus benefits of panendoscopy. It's been at least 10 years since last endoscopy which was unremarkable. She is not anxious for invasive procedure. Considering potential complications and low likelihood of any findings that would benefit her quality of life, will hold off on further evaluation so long as her hemoglobin remains stable on oral iron replacement. Anticipated checking hgb Q3 months.   4. Edema, unspecified type Greatly improved since reducing amlodipine from 10 to 5mg  daily  5. Primary hypertension Office readings elevated since reducing amlodipine dose. Her home readings had been pretty good, but no checking consistently. Is going to check a few times per week and  let me know if consistently over 150.  - amLODipine (NORVASC) 5 MG tablet; Take 1 tablet (5 mg total) by mouth daily.  Dispense: 90 tablet; Refill:   6. Constipation, unspecified constipation type May be aggravated by iron, may also be contributing to occult blood in stool. Consider reducing iron supplement and/or fiber supplements and daily stool softener.       The entirety of the information documented in the History of Present Illness, Review of Systems and Physical Exam were personally obtained by me. Portions of this information were initially documented by the CMA and reviewed by me for thoroughness and accuracy.      [name], MD  [facility] [phone] (phone) [phone] (fax)  [place]

## 2020-02-27 NOTE — Patient Instructions (Addendum)
.   Please review the attached list of medications and notify my office if there are any errors.   . Check blood pressure twice a week and let me know if it consistently remains above 150

## 2020-02-28 LAB — CBC
Hematocrit: 35.4 % (ref 34.0–46.6)
Hemoglobin: 12 g/dL (ref 11.1–15.9)
MCH: 29.5 pg (ref 26.6–33.0)
MCHC: 33.9 g/dL (ref 31.5–35.7)
MCV: 87 fL (ref 79–97)
Platelets: 172 10*3/uL (ref 150–450)
RBC: 4.07 x10E6/uL (ref 3.77–5.28)
RDW: 13.7 % (ref 11.7–15.4)
WBC: 8 10*3/uL (ref 3.4–10.8)

## 2020-02-28 LAB — FERRITIN: Ferritin: 67 ng/mL (ref 15–150)

## 2020-03-06 ENCOUNTER — Other Ambulatory Visit: Payer: Self-pay | Admitting: Family Medicine

## 2020-03-06 DIAGNOSIS — E039 Hypothyroidism, unspecified: Secondary | ICD-10-CM

## 2020-03-27 ENCOUNTER — Ambulatory Visit: Payer: PPO | Admitting: Podiatry

## 2020-03-27 ENCOUNTER — Emergency Department: Payer: PPO

## 2020-03-27 ENCOUNTER — Inpatient Hospital Stay
Admission: EM | Admit: 2020-03-27 | Discharge: 2020-04-02 | DRG: 481 | Disposition: A | Discharge status: Skilled Nursing Facility | Payer: PPO | Attending: Internal Medicine | Admitting: Internal Medicine

## 2020-03-27 ENCOUNTER — Ambulatory Visit: Payer: Self-pay | Admitting: *Deleted

## 2020-03-27 ENCOUNTER — Other Ambulatory Visit: Payer: Self-pay

## 2020-03-27 DIAGNOSIS — Z90710 Acquired absence of both cervix and uterus: Secondary | ICD-10-CM

## 2020-03-27 DIAGNOSIS — W19XXXA Unspecified fall, initial encounter: Secondary | ICD-10-CM | POA: Diagnosis not present

## 2020-03-27 DIAGNOSIS — N183 Chronic kidney disease, stage 3 unspecified: Secondary | ICD-10-CM | POA: Diagnosis not present

## 2020-03-27 DIAGNOSIS — Z87891 Personal history of nicotine dependence: Secondary | ICD-10-CM | POA: Diagnosis not present

## 2020-03-27 DIAGNOSIS — Z419 Encounter for procedure for purposes other than remedying health state, unspecified: Secondary | ICD-10-CM

## 2020-03-27 DIAGNOSIS — N200 Calculus of kidney: Secondary | ICD-10-CM | POA: Diagnosis not present

## 2020-03-27 DIAGNOSIS — Z820 Family history of epilepsy and other diseases of the nervous system: Secondary | ICD-10-CM

## 2020-03-27 DIAGNOSIS — I10 Essential (primary) hypertension: Secondary | ICD-10-CM | POA: Diagnosis present

## 2020-03-27 DIAGNOSIS — I129 Hypertensive chronic kidney disease with stage 1 through stage 4 chronic kidney disease, or unspecified chronic kidney disease: Secondary | ICD-10-CM | POA: Diagnosis not present

## 2020-03-27 DIAGNOSIS — I4510 Unspecified right bundle-branch block: Secondary | ICD-10-CM | POA: Diagnosis present

## 2020-03-27 DIAGNOSIS — M25572 Pain in left ankle and joints of left foot: Secondary | ICD-10-CM | POA: Diagnosis not present

## 2020-03-27 DIAGNOSIS — Z8041 Family history of malignant neoplasm of ovary: Secondary | ICD-10-CM | POA: Diagnosis not present

## 2020-03-27 DIAGNOSIS — K432 Incisional hernia without obstruction or gangrene: Secondary | ICD-10-CM | POA: Diagnosis not present

## 2020-03-27 DIAGNOSIS — M4186 Other forms of scoliosis, lumbar region: Secondary | ICD-10-CM | POA: Diagnosis not present

## 2020-03-27 DIAGNOSIS — N3289 Other specified disorders of bladder: Secondary | ICD-10-CM | POA: Diagnosis not present

## 2020-03-27 DIAGNOSIS — W1830XA Fall on same level, unspecified, initial encounter: Secondary | ICD-10-CM | POA: Diagnosis present

## 2020-03-27 DIAGNOSIS — Z7401 Bed confinement status: Secondary | ICD-10-CM | POA: Diagnosis not present

## 2020-03-27 DIAGNOSIS — M1A9XX0 Chronic gout, unspecified, without tophus (tophi): Secondary | ICD-10-CM

## 2020-03-27 DIAGNOSIS — I2510 Atherosclerotic heart disease of native coronary artery without angina pectoris: Secondary | ICD-10-CM | POA: Diagnosis not present

## 2020-03-27 DIAGNOSIS — M109 Gout, unspecified: Secondary | ICD-10-CM | POA: Diagnosis not present

## 2020-03-27 DIAGNOSIS — E781 Pure hyperglyceridemia: Secondary | ICD-10-CM | POA: Diagnosis not present

## 2020-03-27 DIAGNOSIS — N184 Chronic kidney disease, stage 4 (severe): Secondary | ICD-10-CM | POA: Diagnosis not present

## 2020-03-27 DIAGNOSIS — L03115 Cellulitis of right lower limb: Secondary | ICD-10-CM | POA: Diagnosis not present

## 2020-03-27 DIAGNOSIS — I959 Hypotension, unspecified: Secondary | ICD-10-CM | POA: Diagnosis not present

## 2020-03-27 DIAGNOSIS — S72001A Fracture of unspecified part of neck of right femur, initial encounter for closed fracture: Principal | ICD-10-CM | POA: Diagnosis present

## 2020-03-27 DIAGNOSIS — Z66 Do not resuscitate: Secondary | ICD-10-CM | POA: Diagnosis present

## 2020-03-27 DIAGNOSIS — Z79890 Hormone replacement therapy (postmenopausal): Secondary | ICD-10-CM

## 2020-03-27 DIAGNOSIS — E039 Hypothyroidism, unspecified: Secondary | ICD-10-CM | POA: Diagnosis not present

## 2020-03-27 DIAGNOSIS — W19XXXD Unspecified fall, subsequent encounter: Secondary | ICD-10-CM | POA: Diagnosis not present

## 2020-03-27 DIAGNOSIS — Z20822 Contact with and (suspected) exposure to covid-19: Secondary | ICD-10-CM | POA: Diagnosis not present

## 2020-03-27 DIAGNOSIS — Z888 Allergy status to other drugs, medicaments and biological substances status: Secondary | ICD-10-CM | POA: Diagnosis not present

## 2020-03-27 DIAGNOSIS — K449 Diaphragmatic hernia without obstruction or gangrene: Secondary | ICD-10-CM | POA: Diagnosis not present

## 2020-03-27 DIAGNOSIS — K219 Gastro-esophageal reflux disease without esophagitis: Secondary | ICD-10-CM | POA: Diagnosis not present

## 2020-03-27 DIAGNOSIS — M419 Scoliosis, unspecified: Secondary | ICD-10-CM | POA: Diagnosis not present

## 2020-03-27 DIAGNOSIS — S72011A Unspecified intracapsular fracture of right femur, initial encounter for closed fracture: Principal | ICD-10-CM | POA: Diagnosis present

## 2020-03-27 DIAGNOSIS — S72001D Fracture of unspecified part of neck of right femur, subsequent encounter for closed fracture with routine healing: Secondary | ICD-10-CM | POA: Diagnosis not present

## 2020-03-27 DIAGNOSIS — E559 Vitamin D deficiency, unspecified: Secondary | ICD-10-CM | POA: Diagnosis not present

## 2020-03-27 DIAGNOSIS — S72034A Nondisplaced midcervical fracture of right femur, initial encounter for closed fracture: Secondary | ICD-10-CM | POA: Diagnosis not present

## 2020-03-27 DIAGNOSIS — Z79899 Other long term (current) drug therapy: Secondary | ICD-10-CM

## 2020-03-27 DIAGNOSIS — B379 Candidiasis, unspecified: Secondary | ICD-10-CM | POA: Diagnosis not present

## 2020-03-27 DIAGNOSIS — E785 Hyperlipidemia, unspecified: Secondary | ICD-10-CM | POA: Diagnosis not present

## 2020-03-27 DIAGNOSIS — M62838 Other muscle spasm: Secondary | ICD-10-CM | POA: Diagnosis not present

## 2020-03-27 DIAGNOSIS — Y92019 Unspecified place in single-family (private) house as the place of occurrence of the external cause: Secondary | ICD-10-CM | POA: Diagnosis not present

## 2020-03-27 DIAGNOSIS — S7291XD Unspecified fracture of right femur, subsequent encounter for closed fracture with routine healing: Secondary | ICD-10-CM | POA: Diagnosis not present

## 2020-03-27 DIAGNOSIS — Z01818 Encounter for other preprocedural examination: Secondary | ICD-10-CM | POA: Diagnosis not present

## 2020-03-27 DIAGNOSIS — S72009A Fracture of unspecified part of neck of unspecified femur, initial encounter for closed fracture: Secondary | ICD-10-CM

## 2020-03-27 DIAGNOSIS — Z01811 Encounter for preprocedural respiratory examination: Secondary | ICD-10-CM

## 2020-03-27 DIAGNOSIS — Z9889 Other specified postprocedural states: Secondary | ICD-10-CM | POA: Diagnosis not present

## 2020-03-27 DIAGNOSIS — Z9049 Acquired absence of other specified parts of digestive tract: Secondary | ICD-10-CM

## 2020-03-27 DIAGNOSIS — R0902 Hypoxemia: Secondary | ICD-10-CM | POA: Diagnosis not present

## 2020-03-27 DIAGNOSIS — I252 Old myocardial infarction: Secondary | ICD-10-CM

## 2020-03-27 DIAGNOSIS — M2550 Pain in unspecified joint: Secondary | ICD-10-CM | POA: Diagnosis not present

## 2020-03-27 DIAGNOSIS — M25551 Pain in right hip: Secondary | ICD-10-CM | POA: Diagnosis not present

## 2020-03-27 DIAGNOSIS — K5900 Constipation, unspecified: Secondary | ICD-10-CM | POA: Diagnosis not present

## 2020-03-27 DIAGNOSIS — K439 Ventral hernia without obstruction or gangrene: Secondary | ICD-10-CM | POA: Diagnosis not present

## 2020-03-27 DIAGNOSIS — L0390 Cellulitis, unspecified: Secondary | ICD-10-CM | POA: Diagnosis present

## 2020-03-27 DIAGNOSIS — N816 Rectocele: Secondary | ICD-10-CM | POA: Diagnosis not present

## 2020-03-27 DIAGNOSIS — I739 Peripheral vascular disease, unspecified: Secondary | ICD-10-CM | POA: Diagnosis not present

## 2020-03-27 HISTORY — DX: Acute myocardial infarction, unspecified: I21.9

## 2020-03-27 LAB — CBC WITH DIFFERENTIAL/PLATELET
Abs Immature Granulocytes: 0.07 10*3/uL (ref 0.00–0.07)
Basophils Absolute: 0 10*3/uL (ref 0.0–0.1)
Basophils Relative: 0 %
Eosinophils Absolute: 0.1 10*3/uL (ref 0.0–0.5)
Eosinophils Relative: 1 %
HCT: 37.2 % (ref 36.0–46.0)
Hemoglobin: 12 g/dL (ref 12.0–15.0)
Immature Granulocytes: 1 %
Lymphocytes Relative: 11 %
Lymphs Abs: 1.3 10*3/uL (ref 0.7–4.0)
MCH: 29.1 pg (ref 26.0–34.0)
MCHC: 32.3 g/dL (ref 30.0–36.0)
MCV: 90.3 fL (ref 80.0–100.0)
Monocytes Absolute: 1 10*3/uL (ref 0.1–1.0)
Monocytes Relative: 9 %
Neutro Abs: 9.4 10*3/uL — ABNORMAL HIGH (ref 1.7–7.7)
Neutrophils Relative %: 78 %
Platelets: 137 10*3/uL — ABNORMAL LOW (ref 150–400)
RBC: 4.12 MIL/uL (ref 3.87–5.11)
RDW: 15 % (ref 11.5–15.5)
WBC: 11.9 10*3/uL — ABNORMAL HIGH (ref 4.0–10.5)
nRBC: 0 % (ref 0.0–0.2)

## 2020-03-27 LAB — BASIC METABOLIC PANEL
Anion gap: 12 (ref 5–15)
BUN: 47 mg/dL — ABNORMAL HIGH (ref 8–23)
CO2: 21 mmol/L — ABNORMAL LOW (ref 22–32)
Calcium: 9.8 mg/dL (ref 8.9–10.3)
Chloride: 107 mmol/L (ref 98–111)
Creatinine, Ser: 1.36 mg/dL — ABNORMAL HIGH (ref 0.44–1.00)
GFR, Estimated: 37 mL/min — ABNORMAL LOW (ref >60)
Glucose, Bld: 99 mg/dL (ref 70–99)
Potassium: 3.7 mmol/L (ref 3.5–5.1)
Sodium: 140 mmol/L (ref 135–145)

## 2020-03-27 LAB — RESP PANEL BY RT-PCR (FLU A&B, COVID) ARPGX2
Influenza A by PCR: NEGATIVE
Influenza B by PCR: NEGATIVE
SARS Coronavirus 2 by RT PCR: NEGATIVE

## 2020-03-27 LAB — GLUCOSE, CAPILLARY: Glucose-Capillary: 116 mg/dL — ABNORMAL HIGH (ref 70–99)

## 2020-03-27 LAB — PROTIME-INR
INR: 1 (ref 0.8–1.2)
Prothrombin Time: 13.2 seconds (ref 11.4–15.2)

## 2020-03-27 MED ORDER — CALCIUM CARBONATE-VITAMIN D 500-200 MG-UNIT PO TABS
1.0000 | ORAL_TABLET | Freq: Two times a day (BID) | ORAL | Status: DC
  Administered 2020-03-27 – 2020-04-02 (×11): 1 via ORAL
  Filled 2020-03-27 (×11): qty 1

## 2020-03-27 MED ORDER — DOCUSATE SODIUM 100 MG PO CAPS
100.0000 mg | ORAL_CAPSULE | Freq: Every day | ORAL | Status: DC
  Administered 2020-03-27 – 2020-03-28 (×2): 100 mg via ORAL
  Filled 2020-03-27 (×2): qty 1

## 2020-03-27 MED ORDER — VALSARTAN-HYDROCHLOROTHIAZIDE 320-25 MG PO TABS: 1.0000 | ORAL_TABLET | Freq: Every day | ORAL | Status: DC

## 2020-03-27 MED ORDER — HYDROCHLOROTHIAZIDE 25 MG PO TABS
25.0000 mg | ORAL_TABLET | Freq: Every day | ORAL | Status: DC
  Administered 2020-03-29 – 2020-04-02 (×5): 25 mg via ORAL
  Filled 2020-03-27 (×5): qty 1

## 2020-03-27 MED ORDER — HYDROCODONE-ACETAMINOPHEN 5-325 MG PO TABS: 1.0000 | ORAL_TABLET | Freq: Four times a day (QID) | ORAL | Status: DC | PRN

## 2020-03-27 MED ORDER — ONDANSETRON HCL 4 MG/2ML IJ SOLN: 4.0000 mg | Freq: Four times a day (QID) | INTRAMUSCULAR | Status: DC | PRN

## 2020-03-27 MED ORDER — CEFAZOLIN SODIUM-DEXTROSE 2-4 GM/100ML-% IV SOLN
2.0000 g | INTRAVENOUS | Status: AC
  Administered 2020-03-28: 2 g via INTRAVENOUS
  Filled 2020-03-27: qty 100

## 2020-03-27 MED ORDER — CEFAZOLIN SODIUM-DEXTROSE 1-4 GM/50ML-% IV SOLN
1.0000 g | Freq: Two times a day (BID) | INTRAVENOUS | Status: DC
  Administered 2020-03-27 – 2020-04-01 (×8): 1 g via INTRAVENOUS
  Filled 2020-03-27 (×12): qty 50

## 2020-03-27 MED ORDER — OCUVITE-LUTEIN PO CAPS
1.0000 | ORAL_CAPSULE | Freq: Two times a day (BID) | ORAL | Status: DC
  Administered 2020-03-27 – 2020-04-02 (×11): 1 via ORAL
  Filled 2020-03-27 (×13): qty 1

## 2020-03-27 MED ORDER — PANTOPRAZOLE SODIUM 40 MG PO TBEC
40.0000 mg | DELAYED_RELEASE_TABLET | Freq: Every day | ORAL | Status: DC
  Administered 2020-03-27 – 2020-04-02 (×6): 40 mg via ORAL
  Filled 2020-03-27 (×5): qty 1

## 2020-03-27 MED ORDER — AMLODIPINE BESYLATE 5 MG PO TABS
5.0000 mg | ORAL_TABLET | Freq: Every day | ORAL | Status: DC
Start: 2020-03-28 — End: 2020-04-02
  Administered 2020-03-28 – 2020-04-02 (×6): 5 mg via ORAL
  Filled 2020-03-27 (×6): qty 1

## 2020-03-27 MED ORDER — ALLOPURINOL 100 MG PO TABS
200.0000 mg | ORAL_TABLET | Freq: Every day | ORAL | Status: DC
  Administered 2020-03-27 – 2020-04-01 (×6): 200 mg via ORAL
  Filled 2020-03-27 (×8): qty 2

## 2020-03-27 MED ORDER — METHOCARBAMOL 1000 MG/10ML IJ SOLN
500.0000 mg | Freq: Four times a day (QID) | INTRAVENOUS | Status: DC | PRN
  Filled 2020-03-27: qty 5

## 2020-03-27 MED ORDER — METHOCARBAMOL 500 MG PO TABS
500.0000 mg | ORAL_TABLET | Freq: Four times a day (QID) | ORAL | Status: DC | PRN
  Filled 2020-03-27: qty 1

## 2020-03-27 MED ORDER — SODIUM CHLORIDE 0.9 % IV SOLN
INTRAVENOUS | Status: DC | PRN
  Administered 2020-03-27 – 2020-03-29 (×2): 500 mL via INTRAVENOUS

## 2020-03-27 MED ORDER — MORPHINE SULFATE (PF) 2 MG/ML IV SOLN: 0.5000 mg | INTRAVENOUS | Status: DC | PRN

## 2020-03-27 MED ORDER — LEVOTHYROXINE SODIUM 88 MCG PO TABS
88.0000 ug | ORAL_TABLET | Freq: Every day | ORAL | Status: DC
  Administered 2020-03-28 – 2020-04-02 (×6): 88 ug via ORAL
  Filled 2020-03-27 (×7): qty 1

## 2020-03-27 MED ORDER — SENNA 8.6 MG PO TABS
1.0000 | ORAL_TABLET | Freq: Two times a day (BID) | ORAL | Status: DC
  Administered 2020-03-27 – 2020-04-02 (×10): 8.6 mg via ORAL
  Filled 2020-03-27 (×10): qty 1

## 2020-03-27 MED ORDER — MORPHINE SULFATE (PF) 2 MG/ML IV SOLN
2.0000 mg | Freq: Once | INTRAVENOUS | Status: AC
  Administered 2020-03-27: 2 mg via INTRAVENOUS
  Filled 2020-03-27: qty 1

## 2020-03-27 MED ORDER — ONDANSETRON HCL 4 MG/2ML IJ SOLN
INTRAMUSCULAR | Status: AC
  Administered 2020-03-27: 4 mg via INTRAVENOUS
  Filled 2020-03-27: qty 2

## 2020-03-27 MED ORDER — IRBESARTAN 150 MG PO TABS
300.0000 mg | ORAL_TABLET | Freq: Every day | ORAL | Status: DC
  Administered 2020-03-29 – 2020-04-02 (×5): 300 mg via ORAL
  Filled 2020-03-27 (×5): qty 2

## 2020-03-27 NOTE — Consult Note (Signed)
ORTHOPAEDIC CONSULTATION  REQUESTING PHYSICIAN: Collier Bullock, MD  Chief Complaint: Right femoral neck hip fracture  HPI: [name] [name] is a 87 y.o. female seen in her hospital room with her daughter at the bedside.  Her daughter is a patient of mine in the office.  Patient sustained a mechanical fall at home landing on her right side injuring her right hip.  Patient was brought to the [facility] emergency department by EMS.  X-ray films in the ED reveal an impacted right femoral neck hip fracture.  Orthopedics is consulted for management of this fracture.  Past Medical History:  Diagnosis Date  . Hyperlipidemia   . Hypertension    Past Surgical History:  Procedure Laterality Date  . ABDOMINAL HYSTERECTOMY  1995   abdominal-uterine prolapse, 1 ovary removed, bladder tact  . CARDIAC CATHETERIZATION    . CHOLECYSTECTOMY    . TONSILLECTOMY AND ADENOIDECTOMY     Social History   Socioeconomic History  . Marital status: Widowed    Spouse name: Not on file  . Number of children: 5  . Years of education: Not on file  . Highest education level: Some college, no degree  Occupational History  . Occupation: Retired  Tobacco Use  . Smoking status: Former Smoker    Types: Cigarettes    Quit date: 02/18/2000    Years since quitting: 20.1  . Smokeless tobacco: Never Used  Vaping Use  . Vaping Use: Never used  Substance and Sexual Activity  . Alcohol use: No    Alcohol/week: 0.0 standard drinks  . Drug use: No  . Sexual activity: Never  Other Topics Concern  . Not on file  Social History Narrative  . Not on file   Social Determinants of Health   Financial Resource Strain: Low Risk   . Difficulty of Paying Living Expenses: Not hard at all  Food Insecurity: No Food Insecurity  . Worried About Charity fundraiser in the Last Year: Never true  . Ran Out of Food in the Last Year: Never true  Transportation Needs: No Transportation Needs  . Lack of Transportation  (Medical): No  . Lack of Transportation (Non-Medical): No  Physical Activity: Inactive  . Days of Exercise per Week: 0 days  . Minutes of Exercise per Session: 0 min  Stress: No Stress Concern Present  . Feeling of Stress : Not at all  Social Connections: Socially Isolated  . Frequency of Communication with Friends and Family: Once a week  . Frequency of Social Gatherings with Friends and Family: More than three times a week  . Attends Religious Services: Never  . Active Member of Clubs or Organizations: No  . Attends Archivist Meetings: Never  . Marital Status: Widowed   Family History  Problem Relation Age of Onset  . Ovarian cancer Mother   . Alcohol abuse Sister   . Alzheimer's disease Sister    Allergies  Allergen Reactions  . Atorvastatin Other (See Comments)    myalgias/arthralgias.  . Lovastatin     Other reaction(s): Muscle Pain  . Simvastatin     Elevated CK   Prior to Admission medications   Medication Sig Start Date End Date Taking? Authorizing Provider  allopurinol (ZYLOPRIM) 100 MG tablet TAKE TWO TABLETS EVERY DAY Patient taking differently: Take 200 mg by mouth 2 (two) times daily. 02/27/20  Yes Birdie Sons, MD  amLODipine (NORVASC) 5 MG tablet Take 1 tablet (5 mg total) by mouth daily. 02/27/20  Yes  Birdie Sons, MD  CALCIUM CARBONATE-VITAMIN D PO Take 1 tablet by mouth daily.  09/26/08  Yes [provider]  docusate sodium (COLACE) 100 MG capsule Take 1 capsule (100 mg total) by mouth every evening. Patient taking differently: Take 100 mg by mouth daily as needed. 08/11/18  Yes Birdie Sons, MD  levothyroxine (SYNTHROID) 88 MCG tablet TAKE 1 TABLET BY MOUTH EVERY DAY BEFORE BREAKFAST Patient taking differently: Take 88 mcg by mouth daily before breakfast. 03/06/20  Yes Fisher, Kirstie Peri, MD  Multiple Vitamins-Minerals (PRESERVISION AREDS 2 PO) Take 1 tablet by mouth 2 (two) times daily.   Yes [provider]  omeprazole  (PRILOSEC) 20 MG capsule TAKE 1 CAPSULE BY MOUTH DAILY Patient taking differently: Take 20 mg by mouth daily. 02/27/20  Yes Birdie Sons, MD  valsartan-hydrochlorothiazide (DIOVAN-HCT) 320-25 MG tablet TAKE ONE TABLET BY MOUTH EVERY DAY 08/07/19  Yes Birdie Sons, MD  clotrimazole (CLOTRIMAZOLE ANTI-FUNGAL) 1 % cream Apply 1 application topically 2 (two) times daily. 08/30/19   Trinna Post, PA-C   DG Chest 1 View  Result Date: 03/27/2020 CLINICAL DATA:  Preoperative evaluation, former smoker EXAM: CHEST  1 VIEW COMPARISON:  None. FINDINGS: Interstitial prominence, greatest at the lung bases. No significant pleural effusion. Cardiomediastinal contours are within normal limits. IMPRESSION: Interstitial prominence, greatest at the lung bases. In the absence of acute symptoms, may reflect chronic changes of COPD or fibrosis. Electronically Signed   By: Macy Mis M.D.   On: 03/27/2020 11:01   DG Hip Unilat W or Wo Pelvis 2-3 Views Right  Result Date: 03/27/2020 CLINICAL DATA:  87 year old female status post fall.  Pain. EXAM: DG HIP (WITH OR WITHOUT PELVIS) 2-3V RIGHT COMPARISON:  Right femur series 06/13/2019. Right hip series 10/16/2014. FINDINGS: The pelvis is mildly oblique to the right today. Femoral heads remain normally located. The bony pelvis appears stable since 2016 and intact. SI joints are within normal limits. Pronounced chronic dextroconvex lumbar scoliosis is partially visible. Grossly intact proximal left femur. Mildly impacted subcapital right femoral neck fracture best demonstrated on image 2. Mild valgus angulation. The proximal right femur intertrochanteric segment appears to remain intact. Negative visible lower abdominal and pelvic visceral contours. IMPRESSION: Acute mildly angulated and impacted subcapital right femoral neck fracture. Electronically Signed   By: Genevie Ann M.D.   On: 03/27/2020 10:08    Positive ROS: All other systems have been reviewed and were otherwise  negative with the exception of those mentioned in the HPI and as above.  Physical Exam: General: Alert, no acute distress  MUSCULOSKELETAL: Right lower extremity: Patient skin is intact and there is no erythema ecchymosis or significant swelling.  Her thigh compartments are soft and compressible.  She has palpable pedal pulses, intact/light touch and intact motor function distally.  Patient has there is significant shortening and only mild external rotation of the right lower extremity.  Assessment: Right impacted femoral neck hip fracture status post fall  Plan: I reviewed the details of the fracture with the patient and her daughter.  I recommended percutaneous fixation of this fracture with cannulated screws.  I diagrammed for the patient and her daughter the injury and treatment on the patient's white board in her room today.  I discussed the details of the operation as well as the postoperative course.  Patient will need a skilled nursing facility stay after discharge from the hospital.  I also discussed the risks and benefits of surgery with the patient  and her daughter. The risks include but are not limited to infection, bleeding , vascular necrosis, nerve or blood vessel injury, joint stiffness or loss of motion, persistent pain, weakness or instability, malunion, nonunion, hardware failure and the need for further surgery including conversion to a right hip hemiarthroplasty. Medical risks include but are not limited to DVT and pulmonary embolism, myocardial infarction, stroke, pneumonia, respiratory failure and death. Patient and her daughter understood these risks and wished to proceed.    Thornton Park, MD    03/27/2020 7:17 PM

## 2020-03-27 NOTE — Telephone Encounter (Signed)
Daughter Nada Libman called in.   Her mother fell this morning in the bathroom because she wasn't using her walker.   She did not hit or head.  Only injury is a small cut on her right hand which has been cleaned and a band aid put on it.  Her big complaint is right hip pain and inability to bear weight on it.  No bruising or cuts to right hip that [facility] can see.  Unable to tell if there is deformity.   Pt's right leg swells normally so that's not new.  I advised using an ice pack to the hip for pain and Tylenol if needed too.  I have instructed them to call 911 and have her transported to ED.   They were agreeable to this plan and will go to [facility].  I sent my notes to [facility] for Dr. Maralyn Sago information.    Reason for Disposition . Can't stand (bear weight) or walk  Answer Assessment - Initial Assessment Questions 1. MECHANISM: "How did the injury happen?" (e.g., twisting injury, direct blow)      Daughter Nada Libman calling in.    Mother fell this mother.  Her right hip is painful now.   She not want to walk on it.   She's having trouble moving it.   2. ONSET: "When did the injury happen?" (Minutes or hours ago)      This morning. 3. LOCATION: "Where is the injury located?"      Right hip  She was in the bathroom without her walker and fell. 4. APPEARANCE of INJURY: "What does the injury look like?"  (e.g., deformity of leg)     No bruising.   Her right leg swells anyway so that's not new.   5. SEVERITY: "Can you put weight on that leg?" "Can you walk?"      No   It's very painful   6. SIZE: For cuts, bruises, or swelling, ask: "How large is it?" (e.g., inches or centimeters;  entire joint)      No bruising or cuts.   7. PAIN: "Is there pain?" If Yes, ask: "How bad is the pain?"  (e.g., Scale 1-10; or mild, moderate, severe)     Severe pain  In right hip.   It hurts real bad when she  Moves and is constantly moving. 8. TETANUS: For  any breaks in the skin, ask: "When was the last tetanus booster?"     No breaks in skin.   Did not hip head.  Has a cut on her hand on right.  We cleaned it and it's fine.   9. OTHER SYMPTOMS: "Do you have any other symptoms?"      No dizziness, shortness of breath or chest pain. 10. PREGNANCY: "Is there any chance you are pregnant?" "When was your last menstrual period?"       N/A due to age  Protocols used: HIP INJURY-A-AH

## 2020-03-27 NOTE — ED Notes (Signed)
Phlebotomy called for blood cultures.

## 2020-03-27 NOTE — ED Notes (Signed)
Per daughter patient had episode of spitting up small amount of brown liquid, patient denies nausea or stomach pains.

## 2020-03-27 NOTE — H&P (Signed)
History and Physical    [name] [name] [id number] DOB: [birth year] DOA: 03/27/2020  PCP: [name] Sons, MD   Patient coming from: Home  I have personally briefly reviewed patient's old medical records in [place]  Chief Complaint: Right hip pain  HPI: AAVAH [name] is a 87 y.o. female with medical history significant for hypertension with complications of stage III chronic kidney disease, chronic bilateral lower extremity swelling and dyslipidemia who presents to the ER via EMS for evaluation following a mechanical fall at home with pain involving the right hip.  Patient states that she fell and denies feeling dizzy or lightheaded prior to the fall.  She also denies having any loss of consciousness.  She landed on her right side and was unable to get up or bear weight on the right lower extremity.  Patient was noted to have shortening of the right lower extremity with external rotation by EMS. She denies having any chest pain, no shortness of breath, no nausea, no vomiting, no dizziness, no lightheadedness, no headache, no shortness of breath, no palpitations, no diaphoresis, no diarrhea, no constipation, no urinary frequency, no nocturia, no dysuria, no fever, no chills, no cough, no blurred vision, no focal deficits. Labs show sodium 140, potassium 3.7, chloride 107, bicarb 29, glucose 99, BUN 47, creatinine 1.36, calcium 9.8, white count 11.9, hemoglobin 12.0, hematocrit 37.2, MCV 90.3, RDW 15.0, platelet count 137, PT 13.2, INR 1.0 Respiratory viral panel is negative Chest x-ray reviewed by me shows interstitial prominence, greatest at the lung bases. In the absence of acute symptoms, may reflect chronic changes of COPD or fibrosis. X-ray of the right femur shows acute mildly angulated and impacted subcapital right femoral neck fracture. Twelve-lead EKG reviewed by me shows normal sinus rhythm with left axis deviation and and an incomplete right bundle branch block.   ED  Course: Patient is a 87 year old Caucasian female who was brought into the ER by EMS for evaluation following a mechanical fall.  Patient was found to have shortening of the right lower extremity with external rotation.  X-ray of the right hip shows acute mildly angulated and impacted subcapital right femoral neck fracture.  Orthopedic surgery was consulted by the ER physician and surgical repair is planned for a.m.  Patient will be admitted to the hospital for further evaluation.    Review of Systems: As per HPI otherwise all other systems reviewed and negative.    Past Medical History:  Diagnosis Date  . Hyperlipidemia   . Hypertension     Past Surgical History:  Procedure Laterality Date  . ABDOMINAL HYSTERECTOMY  1995   abdominal-uterine prolapse, 1 ovary removed, bladder tact  . CARDIAC CATHETERIZATION    . CHOLECYSTECTOMY    . TONSILLECTOMY AND ADENOIDECTOMY       reports that she quit smoking about 20 years ago. Her smoking use included cigarettes. She has never used smokeless tobacco. She reports that she does not drink alcohol and does not use drugs.  Allergies  Allergen Reactions  . Atorvastatin Other (See Comments)    myalgias/arthralgias.  . Lovastatin     Other reaction(s): Muscle Pain  . Simvastatin     Elevated CK    Family History  Problem Relation Age of Onset  . Ovarian cancer Mother   . Alcohol abuse Sister   . Alzheimer's disease Sister       Prior to Admission medications   Medication Sig Start Date End Date Taking? Authorizing Provider  allopurinol (  ZYLOPRIM) 100 MG tablet TAKE TWO TABLETS EVERY DAY Patient taking differently: Take 200 mg by mouth 2 (two) times daily. 02/27/20  Yes [name] Sons, MD  amLODipine (NORVASC) 5 MG tablet Take 1 tablet (5 mg total) by mouth daily. 02/27/20  Yes [name] Sons, MD  CALCIUM CARBONATE-VITAMIN D PO Take 1 tablet by mouth daily.  09/26/08  Yes [provider]  docusate sodium (COLACE) 100 MG  capsule Take 1 capsule (100 mg total) by mouth every evening. Patient taking differently: Take 100 mg by mouth daily as needed. 08/11/18  Yes [name] Sons, MD  levothyroxine (SYNTHROID) 88 MCG tablet TAKE 1 TABLET BY MOUTH EVERY DAY BEFORE BREAKFAST Patient taking differently: Take 88 mcg by mouth daily before breakfast. 03/06/20  Yes Fisher, Kirstie Peri, MD  Multiple Vitamins-Minerals (PRESERVISION AREDS 2 PO) Take 1 tablet by mouth 2 (two) times daily.   Yes [provider]  omeprazole (PRILOSEC) 20 MG capsule TAKE 1 CAPSULE BY MOUTH DAILY Patient taking differently: Take 20 mg by mouth daily. 02/27/20  Yes [name] Sons, MD  valsartan-hydrochlorothiazide (DIOVAN-HCT) 320-25 MG tablet TAKE ONE TABLET BY MOUTH EVERY DAY 08/07/19  Yes [name] Sons, MD  clotrimazole (CLOTRIMAZOLE ANTI-FUNGAL) 1 % cream Apply 1 application topically 2 (two) times daily. 08/30/19   Trinna Post, PA-C    Physical Exam: Vitals:   03/27/20 1300 03/27/20 1330 03/27/20 1400 03/27/20 1430  BP: (!) 149/60 (!) 129/103 (!) 149/84 (!) 146/56  Pulse: 70 69 73 68  Resp: 16   17  Temp:      TempSrc:      SpO2: 92% 95% 91% 94%  Weight:      Height:         Vitals:   03/27/20 1300 03/27/20 1330 03/27/20 1400 03/27/20 1430  BP: (!) 149/60 (!) 129/103 (!) 149/84 (!) 146/56  Pulse: 70 69 73 68  Resp: 16   17  Temp:      TempSrc:      SpO2: 92% 95% 91% 94%  Weight:      Height:          Constitutional: Alert and oriented x 3 . Not in any apparent distress HEENT:      Head: Normocephalic and atraumatic.         Eyes: PERLA, EOMI, Conjunctivae are normal. Sclera is non-icteric.       Mouth/Throat: Mucous membranes are moist.       Neck: Supple with no signs of meningismus. Cardiovascular: Regular rate and rhythm. No murmurs, gallops, or rubs. 2+ symmetrical distal pulses are present . No JVD.  2+ lower extremity edema Respiratory: Respiratory effort normal .Lungs sounds clear bilaterally. No  wheezes, crackles, or rhonchi.  Gastrointestinal: Soft, non tender, and non distended with positive bowel sounds.  Genitourinary: No CVA tenderness. Musculoskeletal:  Decreased range of motion right hip, shortening and external rotation of the right leg Neurologic:  Face is symmetric. Moving all extremities. No gross focal neurologic deficits  Skin:  Redness with differential warmth over right lower extremity.  No rash or ulcers Psychiatric: Mood and affect are normal   Labs on Admission: I have personally reviewed following labs and imaging studies  CBC: Recent Labs  Lab 03/27/20 1057  WBC 11.9*  NEUTROABS 9.4*  HGB 12.0  HCT 37.2  MCV 90.3  PLT 0000000*   Basic Metabolic Panel: Recent Labs  Lab 03/27/20 1057  NA 140  K 3.7  CL 107  CO2  21*  GLUCOSE 99  BUN 47*  CREATININE 1.36*  CALCIUM 9.8   GFR: Estimated Creatinine Clearance: 21.1 mL/min (A) (by C-G formula based on SCr of 1.36 mg/dL (H)). Liver Function Tests: No results for input(s): AST, ALT, ALKPHOS, BILITOT, PROT, ALBUMIN in the last 168 hours. No results for input(s): LIPASE, AMYLASE in the last 168 hours. No results for input(s): AMMONIA in the last 168 hours. Coagulation Profile: Recent Labs  Lab 03/27/20 1346  INR 1.0   Cardiac Enzymes: No results for input(s): CKTOTAL, CKMB, CKMBINDEX, TROPONINI in the last 168 hours. BNP (last 3 results) No results for input(s): PROBNP in the last 8760 hours. HbA1C: No results for input(s): HGBA1C in the last 72 hours. CBG: No results for input(s): GLUCAP in the last 168 hours. Lipid Profile: No results for input(s): CHOL, HDL, LDLCALC, TRIG, CHOLHDL, LDLDIRECT in the last 72 hours. Thyroid Function Tests: No results for input(s): TSH, T4TOTAL, FREET4, T3FREE, THYROIDAB in the last 72 hours. Anemia Panel: No results for input(s): VITAMINB12, FOLATE, FERRITIN, TIBC, IRON, RETICCTPCT in the last 72 hours. Urine analysis:    Component Value Date/Time    BILIRUBINUR small 06/17/2019 1204   PROTEINUR Positive (A) 06/17/2019 1204   UROBILINOGEN 0.2 06/17/2019 1204   NITRITE positive 06/17/2019 1204   LEUKOCYTESUR Large (3+) (A) 06/17/2019 1204    Radiological Exams on Admission: DG Chest 1 View  Result Date: 03/27/2020 CLINICAL DATA:  Preoperative evaluation, former smoker EXAM: CHEST  1 VIEW COMPARISON:  None. FINDINGS: Interstitial prominence, greatest at the lung bases. No significant pleural effusion. Cardiomediastinal contours are within normal limits. IMPRESSION: Interstitial prominence, greatest at the lung bases. In the absence of acute symptoms, may reflect chronic changes of COPD or fibrosis. Electronically Signed   By: Macy Mis M.D.   On: 03/27/2020 11:01   DG Hip Unilat W or Wo Pelvis 2-3 Views Right  Result Date: 03/27/2020 CLINICAL DATA:  87 year old female status post fall.  Pain. EXAM: DG HIP (WITH OR WITHOUT PELVIS) 2-3V RIGHT COMPARISON:  Right femur series 06/13/2019. Right hip series 10/16/2014. FINDINGS: The pelvis is mildly oblique to the right today. Femoral heads remain normally located. The bony pelvis appears stable since 2016 and intact. SI joints are within normal limits. Pronounced chronic dextroconvex lumbar scoliosis is partially visible. Grossly intact proximal left femur. Mildly impacted subcapital right femoral neck fracture best demonstrated on image 2. Mild valgus angulation. The proximal right femur intertrochanteric segment appears to remain intact. Negative visible lower abdominal and pelvic visceral contours. IMPRESSION: Acute mildly angulated and impacted subcapital right femoral neck fracture. Electronically Signed   By: Genevie Ann M.D.   On: 03/27/2020 10:08     Assessment/Plan Principal Problem:   Closed displaced fracture of right femoral neck (HCC) Active Problems:   Hypertension   Hypothyroid   CKD (chronic kidney disease) stage 4, GFR 15-29 ml/min (HCC)   Cellulitis   Closed displaced fracture  of right femoral neck Status post mechanical fall Immobilize right lower extremity Pain control Place patient on muscle relaxant Consult orthopedic surgery   Hypertension with complications of stage IV chronic kidney disease Continue hydrochlorothiazide, Irbesartan and amlodipine   Hypothyroidism Continue Synthroid    GERD Continue Protonix    Right leg cellulitis (POA) Patient has swelling, redness and differential warmth involving the right lower extremity concerning for cellulitis She does not have a fever but has a slightly elevated white count Will elevate right lower extremity Place patient on weight-based Ancef  DVT prophylaxis: SCD Code Status: DNR Family Communication: Greater than 50% of time spent discussing patient's condition and plan of care with her daughter and healthcare power of attorney Nada Libman at the bedside.  All questions and concerns have been addressed.  They verbalized understanding and agree with the plan.  CODE STATUS was discussed and patient is a DO NOT RESUSCITATE Disposition Plan: Back to previous home environment Consults called: Orthopedic surgery Status: Inpatient.  The medical decision making for this patient was of high complexity,  patient is at high risk for clinical deterioration during this hospitalization.    Collier Bullock MD [facility]     03/27/2020, 2:58 PM

## 2020-03-27 NOTE — ED Triage Notes (Addendum)
Pt to ER via [facility] from home.   EMS reports pt had mechanical fall today, some pain and shortening present to R hip however on arrival pt reports improvement in pain. Pt denies LOC, denies hitting head. Swelling present to bilateral lower extremities, pt reports this is normal. Redness and warmth present to R lower extremity, daughter states this has worsened over the last few days.   EMS VSS.

## 2020-03-27 NOTE — ED Notes (Signed)
Patient vomited x1, MD notified.

## 2020-03-27 NOTE — ED Provider Notes (Signed)
[facility] Emergency Department Provider Note   ____________________________________________   Event Date/Time   First MD Initiated Contact with Patient 03/27/20 [phone]     (approximate)  I have reviewed the triage vital signs and the nursing notes.   HISTORY  Chief Complaint Fall    HPI [name] [name] is a 87 y.o. female with past medical history of hypertension, hyperlipidemia, CAD, CKD, and lower extremity edema who presents to the ED following fall.  Per EMS, patient had a fall earlier this morning onto her right hip.  She denies hitting her head or losing consciousness, now primarily complains of pain at her right hip.  Family at home reportedly thought patient seemed fine and attempted to have her stand up and walk, but she was unable to do so due to pain in her right hip.  Shortening and external rotation was noted by EMS.  Patient denies pain in any other areas following her fall.        Past Medical History:  Diagnosis Date  . Hyperlipidemia   . Hypertension     Patient Active Problem List   Diagnosis Date Noted  . Localized edema 01/10/2020  . Peripheral vascular disease ([place]) 06/14/2019  . Female bladder prolapse 05/13/2019  . Constipation 08/11/2018  . Right foot ulcer ([place]) 01/24/2015  . Chronic kidney disease (CKD), stage III (moderate) ([place]) 08/16/2014  . Esophageal reflux 08/16/2014  . Obesity 08/16/2014  . Osteopenia 08/16/2014  . Varicose veins of both legs with edema 08/16/2014  . Prediabetes 10/02/2008  . CAD in native artery 09/26/2008  . Gout 09/26/2008  . History of tobacco use 09/26/2008  . Hypertension 09/26/2008  . Hypothyroid 09/26/2008  . Hypercholesterolemia without hypertriglyceridemia 09/26/2008    Past Surgical History:  Procedure Laterality Date  . ABDOMINAL HYSTERECTOMY  1995   abdominal-uterine prolapse, 1 ovary removed, bladder tact  . CARDIAC CATHETERIZATION    . CHOLECYSTECTOMY    .  TONSILLECTOMY AND ADENOIDECTOMY      Prior to Admission medications   Medication Sig Start Date End Date Taking? Authorizing Provider  allopurinol (ZYLOPRIM) 100 MG tablet TAKE TWO TABLETS EVERY DAY 02/27/20   Birdie Sons, MD  amLODipine (NORVASC) 5 MG tablet Take 1 tablet (5 mg total) by mouth daily. 02/27/20   Birdie Sons, MD  CALCIUM CARBONATE-VITAMIN D PO Take 1 tablet by mouth daily.  09/26/08   [provider]  clotrimazole (CLOTRIMAZOLE ANTI-FUNGAL) 1 % cream Apply 1 application topically 2 (two) times daily. 08/30/19   Trinna Post, PA-C  docusate sodium (COLACE) 100 MG capsule Take 1 capsule (100 mg total) by mouth every evening. Patient taking differently: Take 100 mg by mouth daily as needed. 08/11/18   Birdie Sons, MD  levothyroxine (SYNTHROID) 88 MCG tablet TAKE 1 TABLET BY MOUTH EVERY DAY BEFORE BREAKFAST 03/06/20   Birdie Sons, MD  Multiple Vitamins-Minerals (PRESERVISION AREDS 2 PO) Take 2 tablets by mouth daily.    [provider]  omeprazole (PRILOSEC) 20 MG capsule TAKE 1 CAPSULE BY MOUTH DAILY 02/27/20   Birdie Sons, MD  valsartan-hydrochlorothiazide (DIOVAN-HCT) 320-25 MG tablet TAKE ONE TABLET BY MOUTH EVERY DAY 08/07/19   Birdie Sons, MD    Allergies Atorvastatin, Lovastatin, and Simvastatin  Family History  Problem Relation Age of Onset  . Ovarian cancer Mother   . Alcohol abuse Sister   . Alzheimer's disease Sister     Social History Social History   Tobacco  Use  . Smoking status: Former Smoker    Types: Cigarettes    Quit date: 02/18/2000    Years since quitting: 20.1  . Smokeless tobacco: Never Used  Vaping Use  . Vaping Use: Never used  Substance Use Topics  . Alcohol use: No    Alcohol/week: 0.0 standard drinks  . Drug use: No    Review of Systems  Constitutional: No fever/chills Eyes: No visual changes. ENT: No sore throat. Cardiovascular: Denies chest pain. Respiratory: Denies shortness of  breath. Gastrointestinal: No abdominal pain.  No nausea, no vomiting.  No diarrhea.  No constipation. Genitourinary: Negative for dysuria. Musculoskeletal: Negative for back pain.  Positive for right hip pain. Skin: Negative for rash. Neurological: Negative for headaches, focal weakness or numbness.  ____________________________________________   PHYSICAL EXAM:  VITAL SIGNS: ED Triage Vitals  Enc Vitals Group     BP      Pulse      Resp      Temp      Temp src      SpO2      Weight      Height      Head Circumference      Peak Flow      Pain Score      Pain Loc      Pain Edu?      Excl. in [place]?     Constitutional: Alert and oriented. Eyes: Conjunctivae are normal. Head: Atraumatic. Nose: No congestion/rhinnorhea. Mouth/Throat: Mucous membranes are moist. Neck: Normal ROM, no midline cervical spine tenderness. Cardiovascular: Normal rate, regular rhythm. Grossly normal heart sounds.  2+ DP pulses bilaterally. Respiratory: Normal respiratory effort.  No retractions. Lungs CTAB. Gastrointestinal: Soft and nontender. No distention. Genitourinary: deferred Musculoskeletal: Diffuse tenderness to right hip, otherwise no tenderness to left hip, bilateral knees, or bilateral ankles.  2+ pitting edema to bilateral lower extremities with no associated tenderness.  No midline thoracic or lumbar spinal tenderness. Neurologic:  Normal speech and language. No gross focal neurologic deficits are appreciated. Skin:  Skin is warm, dry and intact. No rash noted. Psychiatric: Mood and affect are normal. Speech and behavior are normal.  ____________________________________________   LABS (all labs ordered are listed, but only abnormal results are displayed)  Labs Reviewed  CBC WITH DIFFERENTIAL/PLATELET - Abnormal; Notable for the following components:      Result Value   WBC 11.9 (*)    Platelets 137 (*)    Neutro Abs 9.4 (*)    All other components within normal limits  BASIC  METABOLIC PANEL - Abnormal; Notable for the following components:   CO2 21 (*)    BUN 47 (*)    Creatinine, Ser 1.36 (*)    GFR, Estimated 37 (*)    All other components within normal limits  RESP PANEL BY RT-PCR (FLU A&B, COVID) [id number]   ____________________________________________  EKG  ED ECG REPORT I, Blake Divine, the attending physician, personally viewed and interpreted this ECG.   Date: 03/27/2020  EKG Time: 11:05  Rate: 68  Rhythm: normal sinus rhythm  Axis: LAD  Intervals:none  ST&T Change: None    PROCEDURES  Procedure(s) performed (including Critical Care):  Procedures   ____________________________________________   INITIAL IMPRESSION / ASSESSMENT AND PLAN / ED COURSE       87 year old female with past medical history of hypertension, hyperlipidemia, CAD, CKD, and lower extremity edema who presents to the ED following fall onto her right hip.  Fall appears to have been  mechanical and patient denies hitting her head or losing consciousness.  She is at her baseline mental status per EMS, no signs of trauma to her head or neck area.  She does have tenderness to her right hip, but is neurovascularly intact to her right lower extremity.  We will further assess with x-rays of right hip, patient declines pain medication at this time.  Right hip x-rays reviewed by me and show impacted right femoral neck fracture.  EKG and screening labs are unremarkable.  Case discussed with Dr. Mack Guise of orthopedics, who will plan for operative intervention tomorrow.  Patient now agreeable to receiving pain medication and was given IV morphine.  Case discussed with hospitalist for admission.      ____________________________________________   FINAL CLINICAL IMPRESSION(S) / ED DIAGNOSES  Final diagnoses:  Closed fracture of neck of right femur, initial encounter ([place])  Fall, initial encounter     ED Discharge Orders    None       Note:  This document was  prepared using Dragon voice recognition software and may include unintentional dictation errors.   Blake Divine, MD 03/27/20 [phone]

## 2020-03-28 ENCOUNTER — Inpatient Hospital Stay: Payer: PPO | Admitting: Anesthesiology

## 2020-03-28 ENCOUNTER — Inpatient Hospital Stay: Payer: PPO

## 2020-03-28 ENCOUNTER — Encounter: Payer: Self-pay | Admitting: Internal Medicine

## 2020-03-28 ENCOUNTER — Other Ambulatory Visit: Payer: Self-pay

## 2020-03-28 ENCOUNTER — Encounter
Admission: EM | Disposition: A | Discharge status: Skilled Nursing Facility | Payer: Self-pay | Source: Home / Self Care | Attending: Internal Medicine

## 2020-03-28 DIAGNOSIS — N184 Chronic kidney disease, stage 4 (severe): Secondary | ICD-10-CM

## 2020-03-28 DIAGNOSIS — I10 Essential (primary) hypertension: Secondary | ICD-10-CM

## 2020-03-28 DIAGNOSIS — L03115 Cellulitis of right lower limb: Secondary | ICD-10-CM

## 2020-03-28 DIAGNOSIS — S72001A Fracture of unspecified part of neck of right femur, initial encounter for closed fracture: Secondary | ICD-10-CM

## 2020-03-28 HISTORY — PX: HIP PINNING,CANNULATED: SHX1758

## 2020-03-28 LAB — CBC
HCT: 33.5 % — ABNORMAL LOW (ref 36.0–46.0)
Hemoglobin: 10.9 g/dL — ABNORMAL LOW (ref 12.0–15.0)
MCH: 29.2 pg (ref 26.0–34.0)
MCHC: 32.5 g/dL (ref 30.0–36.0)
MCV: 89.8 fL (ref 80.0–100.0)
Platelets: 137 10*3/uL — ABNORMAL LOW (ref 150–400)
RBC: 3.73 MIL/uL — ABNORMAL LOW (ref 3.87–5.11)
RDW: 14.8 % (ref 11.5–15.5)
WBC: 7.7 10*3/uL (ref 4.0–10.5)
nRBC: 0 % (ref 0.0–0.2)

## 2020-03-28 LAB — BASIC METABOLIC PANEL
Anion gap: 9 (ref 5–15)
BUN: 34 mg/dL — ABNORMAL HIGH (ref 8–23)
CO2: 26 mmol/L (ref 22–32)
Calcium: 9.2 mg/dL (ref 8.9–10.3)
Chloride: 103 mmol/L (ref 98–111)
Creatinine, Ser: 1.16 mg/dL — ABNORMAL HIGH (ref 0.44–1.00)
GFR, Estimated: 45 mL/min — ABNORMAL LOW (ref >60)
Glucose, Bld: 85 mg/dL (ref 70–99)
Potassium: 3.7 mmol/L (ref 3.5–5.1)
Sodium: 138 mmol/L (ref 135–145)

## 2020-03-28 LAB — TYPE AND SCREEN
ABO/RH(D): B POS
Antibody Screen: NEGATIVE

## 2020-03-28 SURGERY — FIXATION, FEMUR, NECK, PERCUTANEOUS, USING SCREW
Anesthesia: Spinal | Site: Hip | Laterality: Right

## 2020-03-28 MED ORDER — LACTATED RINGERS IV SOLN: INTRAVENOUS | Status: DC | PRN

## 2020-03-28 MED ORDER — PROPOFOL 500 MG/50ML IV EMUL
INTRAVENOUS | Status: DC | PRN
  Administered 2020-03-28: 150 ug/kg/min via INTRAVENOUS

## 2020-03-28 MED ORDER — MIDAZOLAM HCL 5 MG/5ML IJ SOLN
INTRAMUSCULAR | Status: DC | PRN
  Administered 2020-03-28: 1 mg via INTRAVENOUS

## 2020-03-28 MED ORDER — PROPOFOL 10 MG/ML IV BOLUS
INTRAVENOUS | Status: DC | PRN
  Administered 2020-03-28: 30 mg via INTRAVENOUS

## 2020-03-28 MED ORDER — PHENYLEPHRINE HCL (PRESSORS) 10 MG/ML IV SOLN
INTRAVENOUS | Status: DC | PRN
  Administered 2020-03-28: 100 ug via INTRAVENOUS

## 2020-03-28 MED ORDER — BUPIVACAINE HCL (PF) 0.5 % IJ SOLN
INTRAMUSCULAR | Status: DC | PRN
  Administered 2020-03-28: 3 mL

## 2020-03-28 MED ORDER — KETAMINE HCL 10 MG/ML IJ SOLN
INTRAMUSCULAR | Status: DC | PRN
  Administered 2020-03-28: 20 mg via INTRAVENOUS

## 2020-03-28 MED ORDER — FENTANYL CITRATE (PF) 100 MCG/2ML IJ SOLN
25.0000 ug | INTRAMUSCULAR | Status: DC | PRN
Start: 2020-03-28 — End: 2020-03-28

## 2020-03-28 MED ORDER — OXYCODONE HCL 5 MG PO TABS: 5.0000 mg | ORAL_TABLET | Freq: Once | ORAL | Status: DC | PRN

## 2020-03-28 MED ORDER — CHLORHEXIDINE GLUCONATE CLOTH 2 % EX PADS
6.0000 | MEDICATED_PAD | Freq: Every day | CUTANEOUS | Status: DC
  Administered 2020-03-28 – 2020-04-01 (×3): 6 via TOPICAL

## 2020-03-28 MED ORDER — ENSURE ENLIVE PO LIQD
237.0000 mL | Freq: Two times a day (BID) | ORAL | Status: DC
  Administered 2020-03-29 – 2020-04-02 (×9): 237 mL via ORAL
  Filled 2020-03-28: qty 237

## 2020-03-28 MED ORDER — PROPOFOL 500 MG/50ML IV EMUL
INTRAVENOUS | Status: AC
  Filled 2020-03-28: qty 50

## 2020-03-28 MED ORDER — NEOMYCIN-POLYMYXIN B GU 40-200000 IR SOLN
Status: AC
  Filled 2020-03-28: qty 1

## 2020-03-28 MED ORDER — FENTANYL CITRATE (PF) 100 MCG/2ML IJ SOLN: 25.0000 ug | INTRAMUSCULAR | Status: DC | PRN

## 2020-03-28 MED ORDER — KETAMINE HCL 50 MG/5ML IJ SOSY
PREFILLED_SYRINGE | INTRAMUSCULAR | Status: AC
  Filled 2020-03-28: qty 5

## 2020-03-28 MED ORDER — ONDANSETRON HCL 4 MG/2ML IJ SOLN: 4.0000 mg | Freq: Once | INTRAMUSCULAR | Status: DC | PRN

## 2020-03-28 MED ORDER — SODIUM CHLORIDE 0.9 % IR SOLN
Status: DC | PRN
  Administered 2020-03-28: 1000 mL

## 2020-03-28 MED ORDER — BUPIVACAINE HCL (PF) 0.5 % IJ SOLN
INTRAMUSCULAR | Status: AC
  Filled 2020-03-28: qty 10

## 2020-03-28 MED ORDER — OXYCODONE HCL 5 MG/5ML PO SOLN: 5.0000 mg | Freq: Once | ORAL | Status: DC | PRN

## 2020-03-28 MED ORDER — MIDAZOLAM HCL 2 MG/2ML IJ SOLN
INTRAMUSCULAR | Status: AC
  Filled 2020-03-28: qty 2

## 2020-03-28 MED ORDER — ENOXAPARIN SODIUM 30 MG/0.3ML ~~LOC~~ SOLN
30.0000 mg | SUBCUTANEOUS | Status: DC
  Administered 2020-03-29 – 2020-04-02 (×5): 30 mg via SUBCUTANEOUS
  Filled 2020-03-28 (×5): qty 0.3

## 2020-03-28 SURGICAL SUPPLY — 37 items
BIT DRILL CANN LRG QC 5X300 (BIT) ×1 IMPLANT
BLADE SURG SZ10 CARB STEEL (BLADE) ×2 IMPLANT
BNDG COHESIVE 6X5 TAN STRL LF (GAUZE/BANDAGES/DRESSINGS) ×4 IMPLANT
COVER WAND RF STERILE (DRAPES) ×2 IMPLANT
DRAPE SURG 17X11 SM STRL (DRAPES) ×4 IMPLANT
DRAPE U-SHAPE 47X51 STRL (DRAPES) ×2 IMPLANT
DRSG OPSITE POSTOP 3X4 (GAUZE/BANDAGES/DRESSINGS) IMPLANT
DRSG OPSITE POSTOP 4X6 (GAUZE/BANDAGES/DRESSINGS) ×1 IMPLANT
DURAPREP 26ML APPLICATOR (WOUND CARE) ×3 IMPLANT
ELECT REM PT RETURN 9FT ADLT (ELECTROSURGICAL) ×2
ELECTRODE REM PT RTRN 9FT ADLT (ELECTROSURGICAL) ×1 IMPLANT
GAUZE SPONGE 4X4 12PLY STRL (GAUZE/BANDAGES/DRESSINGS) ×2 IMPLANT
GLOVE BIOGEL PI IND STRL 9 (GLOVE) ×1 IMPLANT
GLOVE BIOGEL PI INDICATOR 9 (GLOVE) ×1
GLOVE SURG 9.0 ORTHO LTXF (GLOVE) ×4 IMPLANT
GOWN STRL REUS TWL 2XL XL LVL4 (GOWN DISPOSABLE) ×2 IMPLANT
GOWN STRL REUS W/ TWL LRG LVL3 (GOWN DISPOSABLE) ×1 IMPLANT
GOWN STRL REUS W/TWL LRG LVL3 (GOWN DISPOSABLE) ×2
GUIDEWIRE THREADED 2.8 (WIRE) ×3 IMPLANT
HOLDER FOLEY CATH W/STRAP (MISCELLANEOUS) ×1 IMPLANT
MANIFOLD NEPTUNE II (INSTRUMENTS) ×2 IMPLANT
MAT ABSORB  FLUID 56X50 GRAY (MISCELLANEOUS) ×2
MAT ABSORB FLUID 56X50 GRAY (MISCELLANEOUS) ×1 IMPLANT
NS IRRIG 1000ML POUR BTL (IV SOLUTION) ×2 IMPLANT
PACK HIP COMPR (MISCELLANEOUS) ×2 IMPLANT
SCREW 7.3 CANN CON 80 (Screw) ×1 IMPLANT
SCREW 7.3 CANN CON 85 (Screw) ×1 IMPLANT
SCREW 7.3 CANN CON 90 (Screw) ×1 IMPLANT
SCREW CANN 16 THRD/80 7.3 (Screw) ×1 IMPLANT
SCREW CANN 16 THRD/85 7.3 (Screw) ×1 IMPLANT
SCREW CANN 16 THRD/90 7.3 (Screw) ×1 IMPLANT
STAPLER SKIN PROX 35W (STAPLE) ×2 IMPLANT
STRAP SAFETY 5IN WIDE (MISCELLANEOUS) ×1 IMPLANT
SUT VIC AB 0 CT1 36 (SUTURE) ×2 IMPLANT
SUT VIC AB 2-0 CT2 27 (SUTURE) ×2 IMPLANT
SUT VICRYL 0 AB UR-6 (SUTURE) ×4 IMPLANT
TRAY FOLEY MTR SLVR 16FR STAT (SET/KITS/TRAYS/PACK) ×1 IMPLANT

## 2020-03-28 NOTE — Progress Notes (Signed)
PROGRESS NOTE    [name] [name]   [id number]  DOB: June 11, 1926  PCP: Birdie Sons, MD    DOA: 03/27/2020 LOS: 1   Brief Narrative   87 y.o. female with medical history significant for hypertension with complications of stage III chronic kidney disease, chronic bilateral lower extremity swelling and dyslipidemia who presented to the ED via EMS on 03/27/2020 after a mechanical fall at home and persistent right hip pain with inability to bear weight on right lower extremity.    Right femur x-ray in the ED showed an acute mildly angulated and impacted subcapital right femoral neck fracture.  Admitted to hospitalist service with orthopedics consulted.  Patient going to surgery today, 03/28/2020.     Assessment & Plan   Principal Problem:   Closed displaced fracture of right femoral neck (HCC) Active Problems:   Hypertension   Hypothyroid   CKD (chronic kidney disease) stage 4, GFR 15-29 ml/min (HCC)   Cellulitis   Closed is displaced right femoral neck fracture -present on admission due to mechanical fall. Orthopedics is consulted in taking patient to surgery today. --Pain control as needed --Bowel regimen --PT evaluation after surgery, anticipate need for rehab --Continue muscle relaxant  Right lower extremity cellulitis -present on admission with erythematous distal right lower extremity.  Admitting physician also noted differential warmth in that area.  I do not appreciate this today however. Patient was afebrile but did have some mild leukocytosis. --On Ancef --Monitor clinically and daily CBC  Essential hypertension -takes HCTZ, irbesartan and amlodipine, continued.  CKD stage IV -due to hypertension.  Renal function near baseline.  Monitor BMP  Hypothyroidism -continue Synthroid  GERD -continue Protonix  History of gout -not acutely flared.  Continue allopurinol    DVT prophylaxis: SCDs Start: 03/27/20 1207   Diet:  Diet Orders (From admission,  onward)    Start     Ordered   03/28/20 0001  Diet NPO time specified Except for: Sips with Meds  Diet effective midnight       Question:  Except for  Answer:  Ferrel Logan with Meds   03/27/20 1923            Code Status: DNR    Subjective 03/28/20    Patient seen today with daughter at bedside.  Had some nausea vomiting yesterday which patient says resolved.  Currently not having any significant right hip pain.  No fevers or chills.  No nausea vomiting today.   Disposition Plan & Communication   Status is: Inpatient  Remains inpatient appropriate because:Inpatient level of care appropriate due to severity of illness.  Patient undergoing repair of her hip fracture today.   Dispo: The patient is from: Home              Anticipated d/c is to: Home with [facility] versus SNF              Anticipated d/c date is: 2 days              Patient currently is not medically stable to d/c.   Difficult to place patient No   Family Communication: Daughter at bedside on rounds today   Consults, Procedures, Significant Events   Consultants:   Orthopedics  Procedures:   None  Antimicrobials:  Anti-infectives (From admission, onward)   Start     Dose/Rate Route Frequency Ordered Stop   03/27/20 1922  [MAR Hold]  ceFAZolin (ANCEF) IVPB 2g/100 mL premix        (  MAR Hold since Wed 03/28/2020 at 1202.Hold Reason: Transfer to a Procedural area.)   2 g 200 mL/hr over 30 Minutes Intravenous 30 min pre-op 03/27/20 1923 03/28/20 1423   03/27/20 1600  [MAR Hold]  ceFAZolin (ANCEF) IVPB 1 g/50 mL premix        (MAR Hold since Wed 03/28/2020 at 1202.Hold Reason: Transfer to a Procedural area.)   1 g 100 mL/hr over 30 Minutes Intravenous Every 12 hours 03/27/20 1500          Objective   Vitals:   03/27/20 2324 03/28/20 0356 03/28/20 0821 03/28/20 1206  BP: (!) 125/57 (!) 119/58 (!) 127/56 (!) 152/59  Pulse: 71 74 74 76  Resp: '20 20 16 18  '$ Temp: (!) 97.5 F (36.4 C) 97.8 F (36.6 C) 99 F (37.2 C)  98.5 F (36.9 C)  TempSrc:  Oral Oral Oral  SpO2: 94% 96% 92% 96%  Weight:      Height:        Intake/Output Summary (Last 24 hours) at 03/28/2020 1455 Last data filed at 03/28/2020 0700 Gross per 24 hour  Intake 116.78 ml  Output 1250 ml  Net -1133.22 ml   Filed Weights   03/27/20 1215  Weight: 62.6 kg    Physical Exam:  General exam: awake, alert, no acute distress HEENT: Loose upper denture, moist mucus membranes, hearing grossly normal  Respiratory system: CTAB, no wheezes, rales or rhonchi, normal respiratory effort. Cardiovascular system: normal S1/S2, RRR, mild nonpitting lower extremity edema with skin wrinkling reflective of improving edema.   Gastrointestinal system: soft, NT, ND, +bowel sounds. Central nervous system: no gross focal neurologic deficits, normal speech Extremities: Right lower extremity externally rotated, distal right lower extremity with erythema but no tenderness or differential warmth appreciated Skin: dry, intact, normal temperature, normal color    Labs   Data Reviewed: I have personally reviewed following labs and imaging studies  CBC: Recent Labs  Lab 03/27/20 1057 03/28/20 0556  WBC 11.9* 7.7  NEUTROABS 9.4*  --   HGB 12.0 10.9*  HCT 37.2 33.5*  MCV 90.3 89.8  PLT 137* 0000000*   Basic Metabolic Panel: Recent Labs  Lab 03/27/20 1057 03/28/20 0556  NA 140 138  K 3.7 3.7  CL 107 103  CO2 21* 26  GLUCOSE 99 85  BUN 47* 34*  CREATININE 1.36* 1.16*  CALCIUM 9.8 9.2   GFR: Estimated Creatinine Clearance: 24.7 mL/min (A) (by C-G formula based on SCr of 1.16 mg/dL (H)). Liver Function Tests: No results for input(s): AST, ALT, ALKPHOS, BILITOT, PROT, ALBUMIN in the last 168 hours. No results for input(s): LIPASE, AMYLASE in the last 168 hours. No results for input(s): AMMONIA in the last 168 hours. Coagulation Profile: Recent Labs  Lab 03/27/20 1346  INR 1.0   Cardiac Enzymes: No results for input(s): CKTOTAL, CKMB,  CKMBINDEX, TROPONINI in the last 168 hours. BNP (last 3 results) No results for input(s): PROBNP in the last 8760 hours. HbA1C: No results for input(s): HGBA1C in the last 72 hours. CBG: Recent Labs  Lab 03/27/20 2114  GLUCAP 116*   Lipid Profile: No results for input(s): CHOL, HDL, LDLCALC, TRIG, CHOLHDL, LDLDIRECT in the last 72 hours. Thyroid Function Tests: No results for input(s): TSH, T4TOTAL, FREET4, T3FREE, THYROIDAB in the last 72 hours. Anemia Panel: No results for input(s): VITAMINB12, FOLATE, FERRITIN, TIBC, IRON, RETICCTPCT in the last 72 hours. Sepsis Labs: No results for input(s): PROCALCITON, LATICACIDVEN in the last 168 hours.  Recent Results (  from the past 240 hour(s))  Resp Panel by RT-PCR (Flu A&B, Covid) Nasopharyngeal Swab     Status: None   Collection Time: 03/27/20 10:57 AM   Specimen: Nasopharyngeal Swab; Nasopharyngeal(NP) swabs in vial transport medium  Result Value Ref Range Status   SARS Coronavirus 2 by RT PCR NEGATIVE NEGATIVE Final    Comment: (NOTE) SARS-CoV-2 target nucleic acids are NOT DETECTED.  The SARS-CoV-2 RNA is generally detectable in upper respiratory specimens during the acute phase of infection. The lowest concentration of SARS-CoV-2 viral copies this assay can detect is 138 copies/mL. A negative result does not preclude SARS-Cov-2 infection and should not be used as the sole basis for treatment or other patient management decisions. A negative result may occur with  improper specimen collection/handling, submission of specimen other than nasopharyngeal swab, presence of viral mutation(s) within the areas targeted by this assay, and inadequate number of viral copies(<138 copies/mL). A negative result must be combined with clinical observations, patient history, and epidemiological information. The expected result is Negative.  Fact Sheet for Patients:  EntrepreneurPulse.com.au  Fact Sheet for Healthcare  Providers:  IncredibleEmployment.be  This test is no t yet approved or cleared by the [place] FDA and  has been authorized for detection and/or diagnosis of SARS-CoV-2 by FDA under an Emergency Use Authorization (EUA). This EUA will remain  in effect (meaning this test can be used) for the duration of the COVID-19 declaration under Section 564(b)(1) of the Act, 21 U.S.C.section 360bbb-3(b)(1), unless the authorization is terminated  or revoked sooner.       Influenza A by PCR NEGATIVE NEGATIVE Final   Influenza B by PCR NEGATIVE NEGATIVE Final    Comment: (NOTE) The Xpert Xpress SARS-CoV-2/FLU/RSV plus assay is intended as an aid in the diagnosis of influenza from Nasopharyngeal swab specimens and should not be used as a sole basis for treatment. Nasal washings and aspirates are unacceptable for Xpert Xpress SARS-CoV-2/FLU/RSV testing.  Fact Sheet for Patients: EntrepreneurPulse.com.au  Fact Sheet for Healthcare Providers: IncredibleEmployment.be  This test is not yet approved or cleared by the [place] FDA and has been authorized for detection and/or diagnosis of SARS-CoV-2 by FDA under an Emergency Use Authorization (EUA). This EUA will remain in effect (meaning this test can be used) for the duration of the COVID-19 declaration under Section 564(b)(1) of the Act, 21 U.S.C. section 360bbb-3(b)(1), unless the authorization is terminated or revoked.  Performed at [facility], [address] [place], [place] [address]   Culture, blood (routine x 2)     Status: None (Preliminary result)   Collection Time: 03/27/20  3:27 PM   Specimen: BLOOD  Result Value Ref Range Status   Specimen Description BLOOD BLOOD RIGHT FOREARM  Final   Special Requests   Final    BOTTLES DRAWN AEROBIC AND ANAEROBIC Blood Culture adequate volume   Culture   Final    NO GROWTH < 24 HOURS Performed at [facility],  [address] [place], [place] [address]    Report Status PENDING  Incomplete  Culture, blood (routine x 2)     Status: None (Preliminary result)   Collection Time: 03/27/20  3:37 PM   Specimen: BLOOD  Result Value Ref Range Status   Specimen Description BLOOD LEFT ANTECUBITAL  Final   Special Requests   Final    BOTTLES DRAWN AEROBIC AND ANAEROBIC Blood Culture adequate volume   Culture   Final    NO GROWTH < 24 HOURS Performed  at [facility], [address] [place], [place] [address]    Report Status PENDING  Incomplete      Imaging Studies   DG Chest 1 View  Result Date: 03/27/2020 CLINICAL DATA:  Preoperative evaluation, former smoker EXAM: CHEST  1 VIEW COMPARISON:  None. FINDINGS: Interstitial prominence, greatest at the lung bases. No significant pleural effusion. Cardiomediastinal contours are within normal limits. IMPRESSION: Interstitial prominence, greatest at the lung bases. In the absence of acute symptoms, may reflect chronic changes of COPD or fibrosis. Electronically Signed   By: Macy Mis M.D.   On: 03/27/2020 11:01   DG Hip Unilat W or Wo Pelvis 2-3 Views Right  Result Date: 03/27/2020 CLINICAL DATA:  87 year old female status post fall.  Pain. EXAM: DG HIP (WITH OR WITHOUT PELVIS) 2-3V RIGHT COMPARISON:  Right femur series 06/13/2019. Right hip series 10/16/2014. FINDINGS: The pelvis is mildly oblique to the right today. Femoral heads remain normally located. The bony pelvis appears stable since 2016 and intact. SI joints are within normal limits. Pronounced chronic dextroconvex lumbar scoliosis is partially visible. Grossly intact proximal left femur. Mildly impacted subcapital right femoral neck fracture best demonstrated on image 2. Mild valgus angulation. The proximal right femur intertrochanteric segment appears to remain intact. Negative visible lower abdominal and pelvic visceral contours. IMPRESSION: Acute mildly angulated and impacted subcapital  right femoral neck fracture. Electronically Signed   By: Genevie Ann M.D.   On: 03/27/2020 10:08     Medications   Scheduled Meds: . [MAR Hold] allopurinol  200 mg Oral Daily  . [MAR Hold] amLODipine  5 mg Oral Daily  . [MAR Hold] calcium-vitamin D  1 tablet Oral BID  . [MAR Hold] docusate sodium  100 mg Oral QHS  . [MAR Hold] irbesartan  300 mg Oral Daily   And  . [MAR Hold] hydrochlorothiazide  25 mg Oral Daily  . [MAR Hold] levothyroxine  88 mcg Oral Q0600  . [MAR Hold] multivitamin-lutein  1 capsule Oral BID  . [MAR Hold] pantoprazole  40 mg Oral Daily  . [MAR Hold] senna  1 tablet Oral BID   Continuous Infusions: . [MAR Hold] sodium chloride 500 mL (03/27/20 1733)  . [MAR Hold]  ceFAZolin (ANCEF) IV 1 g (03/28/20 0606)  . [MAR Hold] methocarbamol (ROBAXIN) IV         LOS: 1 day    Time spent: 30 minutes with > 50% spent at bedside and coronation of care.    Ezekiel Slocumb, DO [facility]  03/28/2020, 2:55 PM      If 7PM-7AM, please contact night-coverage. How to contact the [facility] Attending or Consulting provider [place] or covering provider during after hours [place], for this patient?    1. Check the care team in [facility] and look for a) attending/consulting [facility] provider listed and b) the [facility] team listed 2. Log into [email or web address] and use [place]'s universal password to access. If you do not have the password, please contact the hospital operator. 3. Locate the [facility] provider you are looking for under [facility] and page to a number that you can be directly reached. 4. If you still have difficulty reaching the provider, please page the [facility] (Director on Call) for the Hospitalists listed on amion for assistance.

## 2020-03-28 NOTE — Progress Notes (Signed)
Initial Nutrition Assessment  DOCUMENTATION CODES:   Not applicable  INTERVENTION:  Once diet is able to be advanced after surgery recommend advancing to dysphagia 2 (finely chopped).  Provide Ensure Enlive po BID, each supplement provides 350 kcal and 20 grams of protein. Patient prefers chocolate.  NUTRITION DIAGNOSIS:   Increased nutrient needs related to post-op healing as evidenced by estimated needs.  GOAL:   Patient will meet greater than or equal to 90% of their needs  MONITOR:   PO intake,Supplement acceptance,Diet advancement,Weight trends,Labs,I & O's  REASON FOR ASSESSMENT:   Consult Assessment of nutrition requirement/status  ASSESSMENT:   87 year old female with PMHx of HTN, HLD, hypothyroidism, MI, CKD stage III admitted after a mechanical fall with closed displaced fracture of right femoral neck.   Met with patient and her daughter at bedside. Most of history provided by daughter. She reports patient has a good appetite. She typically eats 3 meals per day and snacks between meals. Patient has difficulty chewing and only has upper dentures. After discussing options available daughter feels patient would do best with dysphagia 2 diet (finely chopped). Discussed increased nutrient needs for healing. She reports patient would enjoy chocolate Ensure.   Patient's daughter reports her UBW is around 140 lbs and has not been losing weight. Currently documented to be 62.6 kg (138 lbs). Per review of chart patient was 74.8 kg on 07/01/2019. If this weight is accurate patient has lost 12.2 kg (16.4 % body weight) over the past 9 months, which is significant for time frame. However, unsure if weight history is accurate and findings on NFPE are not consistent with this weight loss.   Medications reviewed and include: Oscal with D 1 tablet BID, Colace 100 mg daily, levothyroxine, Ocuvite 1 capsule BID, Protonix, senna 1 tablet BID, cefazolin.  Labs reviewed: CBG 116, BUN 34,  Creatinine 1.16.  Patient does not meet criteria for malnutrition at this time.  NUTRITION - FOCUSED PHYSICAL EXAM:  Flowsheet Row Most Recent Value  Orbital Region No depletion  Upper Arm Region No depletion  Thoracic and Lumbar Region No depletion  Buccal Region No depletion  Temple Region No depletion  Clavicle Bone Region Mild depletion  Clavicle and Acromion Bone Region No depletion  Scapular Bone Region No depletion  Dorsal Hand Mild depletion  Patellar Region No depletion  Anterior Thigh Region No depletion  Posterior Calf Region No depletion  Edema (RD Assessment) Mild  Hair Reviewed  Eyes Reviewed  Mouth Reviewed  Skin Reviewed  Nails Reviewed     Diet Order:   Diet Order            Diet NPO time specified Except for: Sips with Meds  Diet effective midnight                EDUCATION NEEDS:   No education needs have been identified at this time  Skin:  Skin Assessment: Reviewed RN Assessment  Last BM:  Unknown  Height:   Ht Readings from Last 1 Encounters:  03/27/20 4' 10" (1.473 m)   Weight:   Wt Readings from Last 1 Encounters:  03/27/20 62.6 kg   BMI:  Body mass index is 28.84 kg/m.  Estimated Nutritional Needs:   Kcal:  1700-1900  Protein:  85-95 grams  Fluid:  1.6 L/day  Jacklynn Barnacle, MS, RD, LDN Pager number available on Amion

## 2020-03-28 NOTE — Anesthesia Procedure Notes (Signed)
Spinal  Patient location during procedure: OR Start time: 03/28/2020 2:06 PM End time: 03/28/2020 2:13 PM Staffing Performed: anesthesiologist  Anesthesiologist: Molli Barrows, MD Resident/CRNA: Debe Coder, CRNA Preanesthetic Checklist Completed: patient identified, IV checked, site marked, risks and benefits discussed, surgical consent, monitors and equipment checked, pre-op evaluation and timeout performed Spinal Block Patient position: right lateral decubitus Prep: DuraPrep Patient monitoring: continuous pulse ox and blood pressure Approach: right paramedian Location: L4-5 Injection technique: single-shot Needle Needle type: Sprotte and Quincke  Needle gauge: 22 G Needle length: 9 cm Assessment Sensory level: T4

## 2020-03-28 NOTE — Anesthesia Preprocedure Evaluation (Signed)
Anesthesia Evaluation  Patient identified by MRN, date of birth, ID band Patient awake    Reviewed: Allergy & Precautions, H&P , NPO status , Patient's Chart, lab work & pertinent test results  History of Anesthesia Complications (+) PROLONGED EMERGENCE and history of anesthetic complications  Airway Mallampati: III  TM Distance: >3 FB Neck ROM: full    Dental  (+) Missing   Pulmonary neg pulmonary ROS, former smoker,    Pulmonary exam normal        Cardiovascular hypertension, + CAD, + Past MI, + Cardiac Stents and + Peripheral Vascular Disease  Normal cardiovascular exam     Neuro/Psych negative neurological ROS  negative psych ROS   GI/Hepatic Neg liver ROS, GERD  Medicated and Controlled,  Endo/Other  negative endocrine ROSHypothyroidism   Renal/GU Renal disease     Musculoskeletal   Abdominal   Peds  Hematology negative hematology ROS (+)   Anesthesia Other Findings Past Medical History: No date: Hyperlipidemia No date: Hypertension  Past Surgical History: 1995: ABDOMINAL HYSTERECTOMY     Comment:  abdominal-uterine prolapse, 1 ovary removed, bladder               tact No date: CARDIAC CATHETERIZATION No date: CHOLECYSTECTOMY No date: TONSILLECTOMY AND ADENOIDECTOMY  BMI    Body Mass Index: 28.84 kg/m      Reproductive/Obstetrics negative OB ROS                             Anesthesia Physical Anesthesia Plan  ASA: III  Anesthesia Plan: Spinal   Post-op Pain Management:    Induction:   PONV Risk Score and Plan:   Airway Management Planned: Natural Airway and Nasal Cannula  Additional Equipment:   Intra-op Plan:   Post-operative Plan:   Informed Consent: I have reviewed the patients History and Physical, chart, labs and discussed the procedure including the risks, benefits and alternatives for the proposed anesthesia with the patient or authorized  representative who has indicated his/her understanding and acceptance.   Patient has DNR.  Discussed DNR with patient, Discussed DNR with power of attorney and Suspend DNR.   Dental Advisory Given  Plan Discussed with: Anesthesiologist, CRNA and Surgeon  Anesthesia Plan Comments: (History and phone consent from the patients daughter Nada Libman at  [phone]   Patient and daughter reports no bleeding problems and no anticoagulant use.  Plan for spinal with backup GA  Patient and daughter consented for risks of anesthesia including but not limited to:  - adverse reactions to medications - damage to eyes, teeth, lips or other oral mucosa - nerve damage due to positioning  - risk of bleeding, infection and or nerve damage from spinal that could lead to paralysis - risk of headache or failed spinal - damage to teeth, lips or other oral mucosa - sore throat or hoarseness - damage to heart, brain, nerves, lungs, other parts of body or loss of life  They voiced understanding.)        Anesthesia Quick Evaluation

## 2020-03-28 NOTE — Consult Note (Signed)
[facility] has been consulted for Enoxaparin (Lovenox) for DVT Prophylaxis    Filed Weights   03/27/20 1215  Weight: 62.6 kg (138 lb)    Body mass index is 28.84 kg/m.  Estimated Creatinine Clearance: 24.7 mL/min (A) (by C-G formula based on SCr of 1.16 mg/dL (H)).  Patient is candidate for enoxaparin '30mg'$  every 24 hours based on CrCl <30m/min  DESCRIPTION: Pharm[facility]  Patient is now receiving enoxaparin 30 mg every 24 hours    SSherilyn Banker PharmD Pharmacy Resident  03/28/2020 4:35 PM

## 2020-03-28 NOTE — Op Note (Signed)
03/28/2020  3:12 PM  PATIENT:  [name] [name]    PRE-OPERATIVE DIAGNOSIS:  Right Impacted femoral neck hip fracture  POST-OPERATIVE DIAGNOSIS:  Same  PROCEDURE:  CANNULATED SCREW FIXATION OF RIGHT FEMORAL NECK HIP FRACTURE  SURGEON:  Thornton Park, MD  ANESTHESIA:   Spinal  PREOPERATIVE INDICATIONS:  [name] [name] is a  87 y.o. female who fell and was found to have a diagnosis of right impacted femoral neck hip fracture.  Percutaneous fixation has been recommended for fixation of this non-displaced fracture.      The risks benefits and alternatives were discussed with the patient and her daughter preoperatively including but not limited to infection, bleeding, nerve or blood vessel injury, persistent hip pain,  malunion, nonunion, avascular necrosis, change in lower extremity rotation, leg length discrepancy, failure of the hardware and the need for revision surgery, including the potential for conversion to hemi or total hip arthroplasty. Medical risks include but are not limited to: DVT and pulmonary embolism, myocardial infarction, stroke, pneumonia, respiratory failure and death.  The patient understood and agreed with the plan for surgery.  Patient had the right hip marked with the word yes and my initials according the hospitals correct site of surgery protocol.  OPERATIVE IMPLANTS: 7.3 mm cannulated screws x 3  OPERATIVE FINDINGS: Impacted, nondisplaced right femoral neck hip fracture  OPERATIVE PROCEDURE: The patient was brought to the operating room and a spinal anesthetic was administered by the anesthesia service.  The patient was then placed supine on the fracture table. IV Kefzol 2 g was administered. The right lower extremity was positioned in a leg holder, without any significant reduction maneuver other than mild internal rotation.  The well leg was placed in hemi-lithotomy position. The hip was prepped and draped in usual sterile fashion.  A time out was performed to  verify the patient's name, date of birth, medical record number, correct site of surgery and correct surger to be performed. The  timeout was also used to verify the patient had received antibiotics that all appropriate instruments, implants and radiographic studies were available in the room. Once all in attendance were in agreement case began..  Once the reduction was deemed near-anatomic, a small lateral incision was made in line with the femur, distal to the greater trochanter, and 3 threaded guidewires were introduced Into the lateral cortex of the femur, across the fracture site and into the femoral head in an inverted triangle configuration. The lengths of these guidepins were measured with a depth gauge. The lateral cortex was then opened with a cannulated drill, and then the cannulated screws were advanced into position and tightened by hand. Solid fixation was achieved.  The guide pins were then removed and final C-arm images were taken of the fracture fixation. The fracture was well reduced and the hardware in good position.  The wound was irrigated copiously, and the deep and subcutaneous tissues were repaired with 0 and 2-0 Vicryl suture respectively and the skin was approximated with staples.  I was scrubbed and present the entire case and all sharp, sponge and instrument counts were correct at the conclusion of the case.  I spoke with the patient's daughter in the postop consultation room to let her know the case was completed without complication and the patient was stable in the recovery room.    Timoteo Gaul, MD

## 2020-03-28 NOTE — Progress Notes (Signed)
Pt is extremely HOH. Due to pt inability to understand this RN, this RN called and spoke with Dr. Bertell Maria. He stated that if pt vtial signs were stable, pt was ok to go back to her room at this time.

## 2020-03-28 NOTE — Transfer of Care (Signed)
Immediate Anesthesia Transfer of Care Note  Patient: [name] [name]  Procedure(s) Performed: CANNULATED HIP PINNING (Right Hip)  Patient Location: PACU  Anesthesia Type:Spinal  Level of Consciousness: drowsy and patient cooperative  Airway & Oxygen Therapy: Patient Spontanous Breathing and Patient connected to face mask oxygen  Post-op Assessment: Report given to RN and Post -op Vital signs reviewed and stable  Post vital signs: Reviewed and stable  Last Vitals:  Vitals Value Taken Time  BP 96/60 03/28/20 1517  Temp    Pulse 58 03/28/20 1517  Resp 14 03/28/20 1517  SpO2 100 % 03/28/20 1517  Vitals shown include unvalidated device data.  Last Pain:  Vitals:   03/28/20 1206  TempSrc: Oral  PainSc: 0-No pain         Complications: No complications documented.

## 2020-03-28 NOTE — Hospital Course (Addendum)
87 y.o. female with medical history significant for hypertension with complications of stage III chronic kidney disease, chronic bilateral lower extremity swelling and dyslipidemia who presented to the ED via EMS on 03/27/2020 after a mechanical fall at home and persistent right hip pain with inability to bear weight on right lower extremity.    Right femur x-ray in the ED showed an acute mildly angulated and impacted subcapital right femoral neck fracture.  Admitted to hospitalist service with orthopedics consulted.

## 2020-03-29 ENCOUNTER — Encounter: Payer: Self-pay | Admitting: Orthopedic Surgery

## 2020-03-29 LAB — BASIC METABOLIC PANEL
Anion gap: 12 (ref 5–15)
BUN: 32 mg/dL — ABNORMAL HIGH (ref 8–23)
CO2: 26 mmol/L (ref 22–32)
Calcium: 9.4 mg/dL (ref 8.9–10.3)
Chloride: 102 mmol/L (ref 98–111)
Creatinine, Ser: 1.1 mg/dL — ABNORMAL HIGH (ref 0.44–1.00)
GFR, Estimated: 47 mL/min — ABNORMAL LOW (ref >60)
Glucose, Bld: 79 mg/dL (ref 70–99)
Potassium: 3.4 mmol/L — ABNORMAL LOW (ref 3.5–5.1)
Sodium: 140 mmol/L (ref 135–145)

## 2020-03-29 LAB — CBC
HCT: 31.7 % — ABNORMAL LOW (ref 36.0–46.0)
Hemoglobin: 10.2 g/dL — ABNORMAL LOW (ref 12.0–15.0)
MCH: 29.1 pg (ref 26.0–34.0)
MCHC: 32.2 g/dL (ref 30.0–36.0)
MCV: 90.3 fL (ref 80.0–100.0)
Platelets: 125 10*3/uL — ABNORMAL LOW (ref 150–400)
RBC: 3.51 MIL/uL — ABNORMAL LOW (ref 3.87–5.11)
RDW: 14.8 % (ref 11.5–15.5)
WBC: 8.4 10*3/uL (ref 4.0–10.5)
nRBC: 0 % (ref 0.0–0.2)

## 2020-03-29 MED ORDER — ACETAMINOPHEN 325 MG PO TABS: 325.0000 mg | ORAL_TABLET | Freq: Four times a day (QID) | ORAL | Status: DC | PRN

## 2020-03-29 MED ORDER — POLYETHYLENE GLYCOL 3350 17 G PO PACK: 17.0000 g | PACK | Freq: Every day | ORAL | Status: DC | PRN

## 2020-03-29 MED ORDER — HYDROCODONE-ACETAMINOPHEN 5-325 MG PO TABS
1.0000 | ORAL_TABLET | ORAL | Status: DC | PRN
Start: 2020-03-29 — End: 2020-03-29

## 2020-03-29 MED ORDER — ONDANSETRON HCL 4 MG PO TABS: 4.0000 mg | ORAL_TABLET | Freq: Four times a day (QID) | ORAL | Status: DC | PRN

## 2020-03-29 MED ORDER — MAGNESIUM CITRATE PO SOLN
1.0000 | Freq: Once | ORAL | Status: DC | PRN
  Filled 2020-03-29: qty 296

## 2020-03-29 MED ORDER — POTASSIUM CHLORIDE CRYS ER 20 MEQ PO TBCR
40.0000 meq | EXTENDED_RELEASE_TABLET | Freq: Once | ORAL | Status: AC
  Administered 2020-03-29: 40 meq via ORAL
  Filled 2020-03-29: qty 2

## 2020-03-29 MED ORDER — ALUM & MAG HYDROXIDE-SIMETH 200-200-20 MG/5ML PO SUSP: 30.0000 mL | ORAL | Status: DC | PRN

## 2020-03-29 MED ORDER — TRAMADOL HCL 50 MG PO TABS: 50.0000 mg | ORAL_TABLET | Freq: Four times a day (QID) | ORAL | Status: DC

## 2020-03-29 MED ORDER — ENOXAPARIN SODIUM 40 MG/0.4ML ~~LOC~~ SOLN: 40.0000 mg | SUBCUTANEOUS | Status: DC

## 2020-03-29 MED ORDER — ONDANSETRON HCL 4 MG/2ML IJ SOLN: 4.0000 mg | Freq: Four times a day (QID) | INTRAMUSCULAR | Status: DC | PRN

## 2020-03-29 MED ORDER — ACETAMINOPHEN 500 MG PO TABS: 1000.0000 mg | ORAL_TABLET | Freq: Three times a day (TID) | ORAL | Status: DC

## 2020-03-29 MED ORDER — ACETAMINOPHEN 500 MG PO TABS
500.0000 mg | ORAL_TABLET | Freq: Four times a day (QID) | ORAL | Status: AC
  Administered 2020-03-29 (×2): 500 mg via ORAL
  Filled 2020-03-29 (×2): qty 1

## 2020-03-29 MED ORDER — MORPHINE SULFATE (PF) 2 MG/ML IV SOLN: 0.5000 mg | INTRAVENOUS | Status: DC | PRN

## 2020-03-29 MED ORDER — BISACODYL 10 MG RE SUPP: 10.0000 mg | Freq: Every day | RECTAL | Status: DC | PRN

## 2020-03-29 MED ORDER — DOCUSATE SODIUM 100 MG PO CAPS
100.0000 mg | ORAL_CAPSULE | Freq: Two times a day (BID) | ORAL | Status: DC
  Administered 2020-03-29 – 2020-04-02 (×8): 100 mg via ORAL
  Filled 2020-03-29 (×8): qty 1

## 2020-03-29 MED ORDER — TRAMADOL HCL 50 MG PO TABS
50.0000 mg | ORAL_TABLET | Freq: Four times a day (QID) | ORAL | Status: DC | PRN
Start: 2020-03-29 — End: 2020-04-02

## 2020-03-29 NOTE — Progress Notes (Signed)
PT Cancellation Note  Patient Details Name: [name] [name] MRN: [id number] DOB: 11/26/1926   Cancelled Treatment:    Reason Eval/Treat Not Completed: Active bedrest order.  Chart reviewed for current pt and found to have active bedrest order.  Current active bedrest order preventing evaluation at this time.  MD messaged previously, and nursing has been notified by OT through secure chat.  Will attempt as medically appropriate at later time/date.   Gwenlyn Saran, PT, DPT 03/29/20, 11:42 AM

## 2020-03-29 NOTE — Progress Notes (Incomplete Revision)
PROGRESS NOTE    [name] [name]   [id number]  DOB: 15-Feb-1927  PCP: Birdie Sons, MD    DOA: 03/27/2020 LOS: 2   Brief Narrative   87 y.o. female with medical history significant for hypertension with complications of stage III chronic kidney disease, chronic bilateral lower extremity swelling and dyslipidemia who presented to the ED via EMS on 03/27/2020 after a mechanical fall at home and persistent right hip pain with inability to bear weight on right lower extremity.    Right femur x-ray in the ED showed an acute mildly angulated and impacted subcapital right femoral neck fracture.  Admitted to hospitalist service with orthopedics consulted. Underwent surgical repair of the right femoral neck fracture on 03/28/2020 by percutaneous fixation.      Assessment & Plan   Principal Problem:   Closed displaced fracture of right femoral neck (HCC) Active Problems:   Hypertension   Hypothyroid   CKD (chronic kidney disease) stage 4, GFR 15-29 ml/min (HCC)   Cellulitis   Closed is displaced right femoral neck fracture -present on admission due to mechanical fall. Orthopedics is consulted in taking patient to surgery today. --Pain control as needed --Bowel regimen --PT evaluation after surgery, anticipate need for rehab --Continue muscle relaxant  Right lower extremity cellulitis -present on admission with erythematous distal right lower extremity.  Admitting physician also noted differential warmth in that area.  I do not appreciate this today however. Patient was afebrile but did have some mild leukocytosis. --On Ancef -continue.  If clinically improving likely discharge on Keflex.  Given persistent erythema we will continue IV antibiotics for now --Monitor clinically and daily CBC  Essential hypertension -takes HCTZ, irbesartan and amlodipine, continued.  CKD stage IV -due to hypertension.  Renal function near baseline.  Monitor BMP  Hypothyroidism -continue  Synthroid  GERD -continue Protonix  History of gout -not acutely flared.  Continue allopurinol    DVT prophylaxis: SCDs Start: 03/29/20 1053 enoxaparin (LOVENOX) injection 30 mg Start: 03/29/20 1000   Diet:  Diet Orders (From admission, onward)    Start     Ordered   03/29/20 1053  Diet regular Room service appropriate? Yes; Fluid consistency: Thin  Diet effective now       Question Answer Comment  Room service appropriate? Yes   Fluid consistency: Thin      03/29/20 1052            Code Status: DNR    Subjective 03/29/20    Patient seen today with daughter at bedside.  Not having any significant pain.  No fevers, chills, nausea, vomiting or other acute complaints.  PT had not been in to work with her yet.  No acute events reported.   Disposition Plan & Communication   Status is: Inpatient  Remains inpatient appropriate because:Inpatient level of care appropriate due to severity of illness. On IV antibiotics for cellulitis.  Patient requires SNF placement for rehab s/p repair of hip fracture.   Dispo: The patient is from: Home              Anticipated d/c is to: Home with [facility] versus SNF              Anticipated d/c date is: 1 day              Patient currently is not medically stable to d/c.   Difficult to place patient No   Family Communication: Daughter at bedside on rounds today   Consults, Procedures,  Significant Events   Consultants:   Orthopedics  Procedures:   None  Antimicrobials:  Anti-infectives (From admission, onward)   Start     Dose/Rate Route Frequency Ordered Stop   03/27/20 1922  ceFAZolin (ANCEF) IVPB 2g/100 mL premix        2 g 200 mL/hr over 30 Minutes Intravenous 30 min pre-op 03/27/20 1923 03/28/20 1423   03/27/20 1600  ceFAZolin (ANCEF) IVPB 1 g/50 mL premix        1 g 100 mL/hr over 30 Minutes Intravenous Every 12 hours 03/27/20 1500          Objective   Vitals:   03/29/20 0728 03/29/20 1131 03/29/20 1509 03/29/20 2102   BP: 119/60 120/73 (!) 123/58 (!) 135/50  Pulse: 79 87 66 71  Resp: '16 17 16 16  '$ Temp: 98.2 F (36.8 C) 97.7 F (36.5 C) 97.6 F (36.4 C) 98.9 F (37.2 C)  TempSrc:   Oral Oral  SpO2: 100% 92% 91% 91%  Weight:      Height:        Intake/Output Summary (Last 24 hours) at 03/29/2020 2152 Last data filed at 03/29/2020 2104 Gross per 24 hour  Intake 240 ml  Output 1350 ml  Net -1110 ml   Filed Weights   03/27/20 1215  Weight: 62.6 kg    Physical Exam:  General exam: awake, alert, no acute distress Respiratory system: CTAB, no wheezes, rales or rhonchi, normal respiratory effort. Cardiovascular system: RRR, mild lower extremity edema with skin wrinkling reflective of resolving edema - stable to improved. Gastrointestinal system: soft, NT Central nervous system: no gross focal neurologic deficits, normal speech Extremities: improved distal right lower extremity erythema, no tenderness or differential warmth on palpation Skin: dry, intact, normal temperature, erythema of distal RLE    Labs   Data Reviewed: I have personally reviewed following labs and imaging studies  CBC: Recent Labs  Lab 03/27/20 1057 03/28/20 0556 03/29/20 0606  WBC 11.9* 7.7 8.4  NEUTROABS 9.4*  --   --   HGB 12.0 10.9* 10.2*  HCT 37.2 33.5* 31.7*  MCV 90.3 89.8 90.3  PLT 137* 137* 0000000*   Basic Metabolic Panel: Recent Labs  Lab 03/27/20 1057 03/28/20 0556 03/29/20 0606  NA 140 138 140  K 3.7 3.7 3.4*  CL 107 103 102  CO2 21* 26 26  GLUCOSE 99 85 79  BUN 47* 34* 32*  CREATININE 1.36* 1.16* 1.10*  CALCIUM 9.8 9.2 9.4   GFR: Estimated Creatinine Clearance: 26.1 mL/min (A) (by C-G formula based on SCr of 1.1 mg/dL (H)). Liver Function Tests: No results for input(s): AST, ALT, ALKPHOS, BILITOT, PROT, ALBUMIN in the last 168 hours. No results for input(s): LIPASE, AMYLASE in the last 168 hours. No results for input(s): AMMONIA in the last 168 hours. Coagulation Profile: Recent Labs   Lab 03/27/20 1346  INR 1.0   Cardiac Enzymes: No results for input(s): CKTOTAL, CKMB, CKMBINDEX, TROPONINI in the last 168 hours. BNP (last 3 results) No results for input(s): PROBNP in the last 8760 hours. HbA1C: No results for input(s): HGBA1C in the last 72 hours. CBG: Recent Labs  Lab 03/27/20 2114  GLUCAP 116*   Lipid Profile: No results for input(s): CHOL, HDL, LDLCALC, TRIG, CHOLHDL, LDLDIRECT in the last 72 hours. Thyroid Function Tests: No results for input(s): TSH, T4TOTAL, FREET4, T3FREE, THYROIDAB in the last 72 hours. Anemia Panel: No results for input(s): VITAMINB12, FOLATE, FERRITIN, TIBC, IRON, RETICCTPCT in the last 72 hours.  Sepsis Labs: No results for input(s): PROCALCITON, LATICACIDVEN in the last 168 hours.  Recent Results (from the past 240 hour(s))  Resp Panel by RT-PCR (Flu A&B, Covid) Nasopharyngeal Swab     Status: None   Collection Time: 03/27/20 10:57 AM   Specimen: Nasopharyngeal Swab; Nasopharyngeal(NP) swabs in vial transport medium  Result Value Ref Range Status   SARS Coronavirus 2 by RT PCR NEGATIVE NEGATIVE Final    Comment: (NOTE) SARS-CoV-2 target nucleic acids are NOT DETECTED.  The SARS-CoV-2 RNA is generally detectable in upper respiratory specimens during the acute phase of infection. The lowest concentration of SARS-CoV-2 viral copies this assay can detect is 138 copies/mL. A negative result does not preclude SARS-Cov-2 infection and should not be used as the sole basis for treatment or other patient management decisions. A negative result may occur with  improper specimen collection/handling, submission of specimen other than nasopharyngeal swab, presence of viral mutation(s) within the areas targeted by this assay, and inadequate number of viral copies(<138 copies/mL). A negative result must be combined with clinical observations, patient history, and epidemiological information. The expected result is Negative.  Fact Sheet  for Patients:  EntrepreneurPulse.com.au  Fact Sheet for Healthcare Providers:  IncredibleEmployment.be  This test is no t yet approved or cleared by the [place] FDA and  has been authorized for detection and/or diagnosis of SARS-CoV-2 by FDA under an Emergency Use Authorization (EUA). This EUA will remain  in effect (meaning this test can be used) for the duration of the COVID-19 declaration under Section 564(b)(1) of the Act, 21 U.S.C.section 360bbb-3(b)(1), unless the authorization is terminated  or revoked sooner.       Influenza A by PCR NEGATIVE NEGATIVE Final   Influenza B by PCR NEGATIVE NEGATIVE Final    Comment: (NOTE) The Xpert Xpress SARS-CoV-2/FLU/RSV plus assay is intended as an aid in the diagnosis of influenza from Nasopharyngeal swab specimens and should not be used as a sole basis for treatment. Nasal washings and aspirates are unacceptable for Xpert Xpress SARS-CoV-2/FLU/RSV testing.  Fact Sheet for Patients: EntrepreneurPulse.com.au  Fact Sheet for Healthcare Providers: IncredibleEmployment.be  This test is not yet approved or cleared by the [place] FDA and has been authorized for detection and/or diagnosis of SARS-CoV-2 by FDA under an Emergency Use Authorization (EUA). This EUA will remain in effect (meaning this test can be used) for the duration of the COVID-19 declaration under Section 564(b)(1) of the Act, 21 U.S.C. section 360bbb-3(b)(1), unless the authorization is terminated or revoked.  Performed at [facility], [address] [place], [place] [address]   Culture, blood (routine x 2)     Status: None (Preliminary result)   Collection Time: 03/27/20  3:27 PM   Specimen: BLOOD  Result Value Ref Range Status   Specimen Description BLOOD BLOOD RIGHT FOREARM  Final   Special Requests   Final    BOTTLES DRAWN AEROBIC AND ANAEROBIC Blood Culture adequate  volume   Culture   Final    NO GROWTH 2 DAYS Performed at [facility], [address], [place], [place] [address]    Report Status PENDING  Incomplete  Culture, blood (routine x 2)     Status: None (Preliminary result)   Collection Time: 03/27/20  3:37 PM   Specimen: BLOOD  Result Value Ref Range Status   Specimen Description BLOOD LEFT ANTECUBITAL  Final   Special Requests   Final    BOTTLES DRAWN AEROBIC AND ANAEROBIC Blood Culture adequate volume  Culture   Final    NO GROWTH 2 DAYS Performed at [facility], [address] [place], [place] [address]    Report Status PENDING  Incomplete      Imaging Studies   DG Hip Port Unilat With Pelvis 1V Right  Result Date: 03/28/2020 CLINICAL DATA:  Right hip fracture status post pinning EXAM: DG HIP (WITH OR WITHOUT PELVIS) 1V PORT RIGHT COMPARISON:  03/27/2020 FINDINGS: Frontal and cross-table lateral views of the right hip are obtained. Three cannulated screws traverse the subcapital fracture seen previously, with anatomic alignment. Postsurgical changes are seen in the overlying soft tissues. The remainder of the bony pelvis is unremarkable. IMPRESSION: 1. Subcapital right femoral neck fracture status post pinning. Anatomic alignment. Electronically Signed   By: Randa Ngo M.D.   On: 03/28/2020 16:07   DG HIP OPERATIVE UNILAT W OR W/O PELVIS RIGHT  Result Date: 03/28/2020 CLINICAL DATA:  Right hip fracture EXAM: OPERATIVE RIGHT HIP WITH PELVIS COMPARISON:  03/27/2020 FLUOROSCOPY TIME:  Radiation Exposure Index (as provided by the fluoroscopic device): Not available If the device does not provide the exposure index: Fluoroscopy Time:  47 seconds Number of Acquired Images:  2 FINDINGS: Three compression screws are noted traversing the femoral neck on the right. Fracture fragments are in near anatomic alignment. IMPRESSION: ORIF of right femoral fracture. Electronically Signed   By: Inez Catalina M.D.   On: 03/28/2020  15:25     Medications   Scheduled Meds: . acetaminophen  500 mg Oral Q6H  . allopurinol  200 mg Oral Daily  . amLODipine  5 mg Oral Daily  . calcium-vitamin D  1 tablet Oral BID  . Chlorhexidine Gluconate Cloth  6 each Topical Daily  . docusate sodium  100 mg Oral BID  . enoxaparin (LOVENOX) injection  30 mg Subcutaneous Q24H  . feeding supplement  237 mL Oral BID BM  . irbesartan  300 mg Oral Daily   And  . hydrochlorothiazide  25 mg Oral Daily  . levothyroxine  88 mcg Oral Q0600  . multivitamin-lutein  1 capsule Oral BID  . pantoprazole  40 mg Oral Daily  . senna  1 tablet Oral BID   Continuous Infusions: . sodium chloride 500 mL (03/29/20 0423)  .  ceFAZolin (ANCEF) IV 1 g (03/29/20 1545)  . methocarbamol (ROBAXIN) IV         LOS: 2 days    Time spent: 25 minutes with > 50% spent at bedside and coronation of care.    Ezekiel Slocumb, DO [facility]  03/29/2020, 9:52 PM      If 7PM-7AM, please contact night-coverage. How to contact the [facility] Attending or Consulting provider [place] or covering provider during after hours [place], for this patient?    1. Check the care team in [facility] and look for a) attending/consulting [facility] provider listed and b) the [facility] team listed 2. Log into [email or web address] and use [place]'s universal password to access. If you do not have the password, please contact the hospital operator. 3. Locate the [facility] provider you are looking for under [facility] and page to a number that you can be directly reached. 4. If you still have difficulty reaching the provider, please page the [facility] (Director on Call) for the Hospitalists listed on amion for assistance.

## 2020-03-29 NOTE — Progress Notes (Addendum)
Subjective:  POD #1 s/p percutaneous fixation of right impacted femoral neck hip fracture.   Patient reports right hip pain as mild at rest but states that she has significant pain with right hip movement.  Patient's daughter is at the bedside.  Objective:   VITALS:   Vitals:   03/28/20 1928 03/28/20 2026 03/29/20 0538 03/29/20 0728  BP: 140/67 (!) 152/53 (!) 113/55 119/60  Pulse: 76 76 80 79  Resp: '17 17 18 16  '$ Temp: 98.1 F (36.7 C) (!) 97.4 F (36.3 C) 98 F (36.7 C) 98.2 F (36.8 C)  TempSrc: Oral Oral    SpO2: 97% 97% 91% 100%  Weight:      Height:        PHYSICAL EXAM: Right lower extremity: Neurovascular intact Sensation intact distally Intact pulses distally Dorsiflexion/Plantar flexion intact Incision: dressing C/D/I No cellulitis present Compartment soft  LABS  Results for orders placed or performed during the hospital encounter of 03/27/20 (from the past 24 hour(s))  Basic metabolic panel     Status: Abnormal   Collection Time: 03/29/20  6:06 AM  Result Value Ref Range   Sodium 140 135 - 145 mmol/L   Potassium 3.4 (L) 3.5 - 5.1 mmol/L   Chloride 102 98 - 111 mmol/L   CO2 26 22 - 32 mmol/L   Glucose, Bld 79 70 - 99 mg/dL   BUN 32 (H) 8 - 23 mg/dL   Creatinine, Ser 1.10 (H) 0.44 - 1.00 mg/dL   Calcium 9.4 8.9 - 10.3 mg/dL   GFR, Estimated 47 (L) >60 mL/min   Anion gap 12 5 - 15  CBC     Status: Abnormal   Collection Time: 03/29/20  6:06 AM  Result Value Ref Range   WBC 8.4 4.0 - 10.5 K/uL   RBC 3.51 (L) 3.87 - 5.11 MIL/uL   Hemoglobin 10.2 (L) 12.0 - 15.0 g/dL   HCT 31.7 (L) 36.0 - 46.0 %   MCV 90.3 80.0 - 100.0 fL   MCH 29.1 26.0 - 34.0 pg   MCHC 32.2 30.0 - 36.0 g/dL   RDW 14.8 11.5 - 15.5 %   Platelets 125 (L) 150 - 400 K/uL   nRBC 0.0 0.0 - 0.2 %    DG Chest 1 View  Result Date: 03/27/2020 CLINICAL DATA:  Preoperative evaluation, former smoker EXAM: CHEST  1 VIEW COMPARISON:  None. FINDINGS: Interstitial prominence, greatest at the lung  bases. No significant pleural effusion. Cardiomediastinal contours are within normal limits. IMPRESSION: Interstitial prominence, greatest at the lung bases. In the absence of acute symptoms, may reflect chronic changes of COPD or fibrosis. Electronically Signed   By: Macy Mis M.D.   On: 03/27/2020 11:01   DG Hip Port Unilat With Pelvis 1V Right  Result Date: 03/28/2020 CLINICAL DATA:  Right hip fracture status post pinning EXAM: DG HIP (WITH OR WITHOUT PELVIS) 1V PORT RIGHT COMPARISON:  03/27/2020 FINDINGS: Frontal and cross-table lateral views of the right hip are obtained. Three cannulated screws traverse the subcapital fracture seen previously, with anatomic alignment. Postsurgical changes are seen in the overlying soft tissues. The remainder of the bony pelvis is unremarkable. IMPRESSION: 1. Subcapital right femoral neck fracture status post pinning. Anatomic alignment. Electronically Signed   By: Randa Ngo M.D.   On: 03/28/2020 16:07   DG HIP OPERATIVE UNILAT W OR W/O PELVIS RIGHT  Result Date: 03/28/2020 CLINICAL DATA:  Right hip fracture EXAM: OPERATIVE RIGHT HIP WITH PELVIS COMPARISON:  03/27/2020 FLUOROSCOPY TIME:  Radiation Exposure Index (as provided by the fluoroscopic device): Not available If the device does not provide the exposure index: Fluoroscopy Time:  47 seconds Number of Acquired Images:  2 FINDINGS: Three compression screws are noted traversing the femoral neck on the right. Fracture fragments are in near anatomic alignment. IMPRESSION: ORIF of right femoral fracture. Electronically Signed   By: Inez Catalina M.D.   On: 03/28/2020 15:25   DG Hip Unilat W or Wo Pelvis 2-3 Views Right  Result Date: 03/27/2020 CLINICAL DATA:  87 year old female status post fall.  Pain. EXAM: DG HIP (WITH OR WITHOUT PELVIS) 2-3V RIGHT COMPARISON:  Right femur series 06/13/2019. Right hip series 10/16/2014. FINDINGS: The pelvis is mildly oblique to the right today. Femoral heads remain normally  located. The bony pelvis appears stable since 2016 and intact. SI joints are within normal limits. Pronounced chronic dextroconvex lumbar scoliosis is partially visible. Grossly intact proximal left femur. Mildly impacted subcapital right femoral neck fracture best demonstrated on image 2. Mild valgus angulation. The proximal right femur intertrochanteric segment appears to remain intact. Negative visible lower abdominal and pelvic visceral contours. IMPRESSION: Acute mildly angulated and impacted subcapital right femoral neck fracture. Electronically Signed   By: Genevie Ann M.D.   On: 03/27/2020 10:08    Assessment/Plan: 1 Day Post-Op   Principal Problem:   Closed displaced fracture of right femoral neck (HCC) Active Problems:   Hypertension   Hypothyroid   CKD (chronic kidney disease) stage 4, GFR 15-29 ml/min (HCC)   Cellulitis  Patient is doing well postop.  Continue cefazolin for erythema in the distal right leg likely cellulitis.  Patient will begin physical therapy.  She is 10% weightbearing on the right lower extremity x 6 weeks post-op.  Begin Lovenox for DVT prophylaxis today.  Patient will require SNF upon discharge.    [place] , MD 03/29/2020, 8:54 AM

## 2020-03-29 NOTE — Progress Notes (Addendum)
PROGRESS NOTE    [name] [name]   [id number]  DOB: 1926-09-11  PCP: Birdie Sons, MD    DOA: 03/27/2020 LOS: 2   Brief Narrative   87 y.o. female with medical history significant for hypertension with complications of stage III chronic kidney disease, chronic bilateral lower extremity swelling and dyslipidemia who presented to the ED via EMS on 03/27/2020 after a mechanical fall at home and persistent right hip pain with inability to bear weight on right lower extremity.    Right femur x-ray in the ED showed an acute mildly angulated and impacted subcapital right femoral neck fracture.  Admitted to hospitalist service with orthopedics consulted. Underwent surgical repair of the right femoral neck fracture on 03/28/2020 by percutaneous fixation.      Assessment & Plan   Principal Problem:   Closed displaced fracture of right femoral neck (HCC) Active Problems:   Hypertension   Hypothyroid   CKD (chronic kidney disease) stage 4, GFR 15-29 ml/min (HCC)   Cellulitis   Closed is displaced right femoral neck fracture -present on admission due to mechanical fall. Orthopedics is consulted in taking patient to surgery today. --Pain control as needed --Bowel regimen --PT evaluation after surgery, anticipate need for rehab --Continue muscle relaxant  Right lower extremity cellulitis -present on admission with erythematous distal right lower extremity.  Admitting physician also noted differential warmth in that area.  I do not appreciate this today however. Patient was afebrile but did have some mild leukocytosis. --On Ancef -continue.  If clinically improving likely discharge on Keflex.  Given persistent erythema we will continue IV antibiotics for now --Monitor clinically and daily CBC  Essential hypertension -takes HCTZ, irbesartan and amlodipine, continued.  CKD stage IV -due to hypertension.  Renal function near baseline.  Monitor BMP  Hypothyroidism -continue  Synthroid  GERD -continue Protonix  History of gout -not acutely flared.  Continue allopurinol    DVT prophylaxis: SCDs Start: 03/29/20 1053 enoxaparin (LOVENOX) injection 30 mg Start: 03/29/20 1000   Diet:  Diet Orders (From admission, onward)    Start     Ordered   03/29/20 1053  Diet regular Room service appropriate? Yes; Fluid consistency: Thin  Diet effective now       Question Answer Comment  Room service appropriate? Yes   Fluid consistency: Thin      03/29/20 1052            Code Status: DNR    Subjective 03/29/20    Patient seen today with daughter at bedside.  Not having any significant pain.  No fevers, chills, nausea, vomiting or other acute complaints.  PT had not been in to work with her yet.  No acute events reported.   Disposition Plan & Communication   Status is: Inpatient  Remains inpatient appropriate because:Inpatient level of care appropriate due to severity of illness. On IV antibiotics for cellulitis.  Patient requires SNF placement for rehab s/p repair of hip fracture.   Dispo: The patient is from: Home              Anticipated d/c is to: Home with [facility] versus SNF              Anticipated d/c date is: 1 day              Patient currently is not medically stable to d/c.   Difficult to place patient No   Family Communication: Daughter at bedside on rounds today   Consults, Procedures,  Significant Events   Consultants:   Orthopedics  Procedures:   None  Antimicrobials:  Anti-infectives (From admission, onward)   Start     Dose/Rate Route Frequency Ordered Stop   03/27/20 1922  ceFAZolin (ANCEF) IVPB 2g/100 mL premix        2 g 200 mL/hr over 30 Minutes Intravenous 30 min pre-op 03/27/20 1923 03/28/20 1423   03/27/20 1600  ceFAZolin (ANCEF) IVPB 1 g/50 mL premix        1 g 100 mL/hr over 30 Minutes Intravenous Every 12 hours 03/27/20 1500          Objective   Vitals:   03/29/20 0728 03/29/20 1131 03/29/20 1509 03/29/20 2102   BP: 119/60 120/73 (!) 123/58 (!) 135/50  Pulse: 79 87 66 71  Resp: '16 17 16 16  '$ Temp: 98.2 F (36.8 C) 97.7 F (36.5 C) 97.6 F (36.4 C) 98.9 F (37.2 C)  TempSrc:   Oral Oral  SpO2: 100% 92% 91% 91%  Weight:      Height:        Intake/Output Summary (Last 24 hours) at 03/29/2020 2152 Last data filed at 03/29/2020 2104 Gross per 24 hour  Intake 240 ml  Output 1350 ml  Net -1110 ml   Filed Weights   03/27/20 1215  Weight: 62.6 kg    Physical Exam:  General exam: awake, alert, no acute distress Respiratory system: CTAB, no wheezes, rales or rhonchi, normal respiratory effort. Cardiovascular system: RRR, mild lower extremity edema with skin wrinkling reflective of resolving edema - stable to improved. Gastrointestinal system: soft, NT Central nervous system: no gross focal neurologic deficits, normal speech Extremities: improved distal right lower extremity erythema, no tenderness or differential warmth on palpation Skin: dry, intact, normal temperature, erythema of distal RLE    Labs   Data Reviewed: I have personally reviewed following labs and imaging studies  CBC: Recent Labs  Lab 03/27/20 1057 03/28/20 0556 03/29/20 0606  WBC 11.9* 7.7 8.4  NEUTROABS 9.4*  --   --   HGB 12.0 10.9* 10.2*  HCT 37.2 33.5* 31.7*  MCV 90.3 89.8 90.3  PLT 137* 137* 0000000*   Basic Metabolic Panel: Recent Labs  Lab 03/27/20 1057 03/28/20 0556 03/29/20 0606  NA 140 138 140  K 3.7 3.7 3.4*  CL 107 103 102  CO2 21* 26 26  GLUCOSE 99 85 79  BUN 47* 34* 32*  CREATININE 1.36* 1.16* 1.10*  CALCIUM 9.8 9.2 9.4   GFR: Estimated Creatinine Clearance: 26.1 mL/min (A) (by C-G formula based on SCr of 1.1 mg/dL (H)). Liver Function Tests: No results for input(s): AST, ALT, ALKPHOS, BILITOT, PROT, ALBUMIN in the last 168 hours. No results for input(s): LIPASE, AMYLASE in the last 168 hours. No results for input(s): AMMONIA in the last 168 hours. Coagulation Profile: Recent Labs   Lab 03/27/20 1346  INR 1.0   Cardiac Enzymes: No results for input(s): CKTOTAL, CKMB, CKMBINDEX, TROPONINI in the last 168 hours. BNP (last 3 results) No results for input(s): PROBNP in the last 8760 hours. HbA1C: No results for input(s): HGBA1C in the last 72 hours. CBG: Recent Labs  Lab 03/27/20 2114  GLUCAP 116*   Lipid Profile: No results for input(s): CHOL, HDL, LDLCALC, TRIG, CHOLHDL, LDLDIRECT in the last 72 hours. Thyroid Function Tests: No results for input(s): TSH, T4TOTAL, FREET4, T3FREE, THYROIDAB in the last 72 hours. Anemia Panel: No results for input(s): VITAMINB12, FOLATE, FERRITIN, TIBC, IRON, RETICCTPCT in the last 72 hours.  Sepsis Labs: No results for input(s): PROCALCITON, LATICACIDVEN in the last 168 hours.  Recent Results (from the past 240 hour(s))  Resp Panel by RT-PCR (Flu A&B, Covid) Nasopharyngeal Swab     Status: None   Collection Time: 03/27/20 10:57 AM   Specimen: Nasopharyngeal Swab; Nasopharyngeal(NP) swabs in vial transport medium  Result Value Ref Range Status   SARS Coronavirus 2 by RT PCR NEGATIVE NEGATIVE Final    Comment: (NOTE) SARS-CoV-2 target nucleic acids are NOT DETECTED.  The SARS-CoV-2 RNA is generally detectable in upper respiratory specimens during the acute phase of infection. The lowest concentration of SARS-CoV-2 viral copies this assay can detect is 138 copies/mL. A negative result does not preclude SARS-Cov-2 infection and should not be used as the sole basis for treatment or other patient management decisions. A negative result may occur with  improper specimen collection/handling, submission of specimen other than nasopharyngeal swab, presence of viral mutation(s) within the areas targeted by this assay, and inadequate number of viral copies(<138 copies/mL). A negative result must be combined with clinical observations, patient history, and epidemiological information. The expected result is Negative.  Fact Sheet  for Patients:  EntrepreneurPulse.com.au  Fact Sheet for Healthcare Providers:  IncredibleEmployment.be  This test is no t yet approved or cleared by the [place] FDA and  has been authorized for detection and/or diagnosis of SARS-CoV-2 by FDA under an Emergency Use Authorization (EUA). This EUA will remain  in effect (meaning this test can be used) for the duration of the COVID-19 declaration under Section 564(b)(1) of the Act, 21 U.S.C.section 360bbb-3(b)(1), unless the authorization is terminated  or revoked sooner.       Influenza A by PCR NEGATIVE NEGATIVE Final   Influenza B by PCR NEGATIVE NEGATIVE Final    Comment: (NOTE) The Xpert Xpress SARS-CoV-2/FLU/RSV plus assay is intended as an aid in the diagnosis of influenza from Nasopharyngeal swab specimens and should not be used as a sole basis for treatment. Nasal washings and aspirates are unacceptable for Xpert Xpress SARS-CoV-2/FLU/RSV testing.  Fact Sheet for Patients: EntrepreneurPulse.com.au  Fact Sheet for Healthcare Providers: IncredibleEmployment.be  This test is not yet approved or cleared by the [place] FDA and has been authorized for detection and/or diagnosis of SARS-CoV-2 by FDA under an Emergency Use Authorization (EUA). This EUA will remain in effect (meaning this test can be used) for the duration of the COVID-19 declaration under Section 564(b)(1) of the Act, 21 U.S.C. section 360bbb-3(b)(1), unless the authorization is terminated or revoked.  Performed at [facility], [address] [place], [place] [address]   Culture, blood (routine x 2)     Status: None (Preliminary result)   Collection Time: 03/27/20  3:27 PM   Specimen: BLOOD  Result Value Ref Range Status   Specimen Description BLOOD BLOOD RIGHT FOREARM  Final   Special Requests   Final    BOTTLES DRAWN AEROBIC AND ANAEROBIC Blood Culture adequate  volume   Culture   Final    NO GROWTH 2 DAYS Performed at [facility], [address] [place], [place] [address]    Report Status PENDING  Incomplete  Culture, blood (routine x 2)     Status: None (Preliminary result)   Collection Time: 03/27/20  3:37 PM   Specimen: BLOOD  Result Value Ref Range Status   Specimen Description BLOOD LEFT ANTECUBITAL  Final   Special Requests   Final    BOTTLES DRAWN AEROBIC AND ANAEROBIC Blood Culture adequate volume  Culture   Final    NO GROWTH 2 DAYS Performed at [facility], [name] [place], [place] [address]    Report Status PENDING  Incomplete      Imaging Studies   DG Hip Port Unilat With Pelvis 1V Right  Result Date: 03/28/2020 CLINICAL DATA:  Right hip fracture status post pinning EXAM: DG HIP (WITH OR WITHOUT PELVIS) 1V PORT RIGHT COMPARISON:  03/27/2020 FINDINGS: Frontal and cross-table lateral views of the right hip are obtained. Three cannulated screws traverse the subcapital fracture seen previously, with anatomic alignment. Postsurgical changes are seen in the overlying soft tissues. The remainder of the bony pelvis is unremarkable. IMPRESSION: 1. Subcapital right femoral neck fracture status post pinning. Anatomic alignment. Electronically Signed   By: Randa Ngo M.D.   On: 03/28/2020 16:07   DG HIP OPERATIVE UNILAT W OR W/O PELVIS RIGHT  Result Date: 03/28/2020 CLINICAL DATA:  Right hip fracture EXAM: OPERATIVE RIGHT HIP WITH PELVIS COMPARISON:  03/27/2020 FLUOROSCOPY TIME:  Radiation Exposure Index (as provided by the fluoroscopic device): Not available If the device does not provide the exposure index: Fluoroscopy Time:  47 seconds Number of Acquired Images:  2 FINDINGS: Three compression screws are noted traversing the femoral neck on the right. Fracture fragments are in near anatomic alignment. IMPRESSION: ORIF of right femoral fracture. Electronically Signed   By: Inez Catalina M.D.   On: 03/28/2020  15:25     Medications   Scheduled Meds: . acetaminophen  500 mg Oral Q6H  . allopurinol  200 mg Oral Daily  . amLODipine  5 mg Oral Daily  . calcium-vitamin D  1 tablet Oral BID  . Chlorhexidine Gluconate Cloth  6 each Topical Daily  . docusate sodium  100 mg Oral BID  . enoxaparin (LOVENOX) injection  30 mg Subcutaneous Q24H  . feeding supplement  237 mL Oral BID BM  . irbesartan  300 mg Oral Daily   And  . hydrochlorothiazide  25 mg Oral Daily  . levothyroxine  88 mcg Oral Q0600  . multivitamin-lutein  1 capsule Oral BID  . pantoprazole  40 mg Oral Daily  . senna  1 tablet Oral BID   Continuous Infusions: . sodium chloride 500 mL (03/29/20 0423)  .  ceFAZolin (ANCEF) IV 1 g (03/29/20 1545)  . methocarbamol (ROBAXIN) IV         LOS: 2 days    Time spent: 25 minutes with > 50% spent at bedside and coronation of care.    Ezekiel Slocumb, DO [facility]  03/29/2020, 9:52 PM      If 7PM-7AM, please contact night-coverage. How to contact the [facility] Attending or Consulting provider [place] or covering provider during after hours [place], for this patient?    1. Check the care team in [facility] and look for a) attending/consulting [facility] provider listed and b) the [facility] team listed 2. Log into [email or web address] and use [place]'s universal password to access. If you do not have the password, please contact the hospital operator. 3. Locate the [facility] provider you are looking for under [facility] and page to a number that you can be directly reached. 4. If you still have difficulty reaching the provider, please page the [facility] (Director on Call) for the Hospitalists listed on amion for assistance.

## 2020-03-29 NOTE — Evaluation (Signed)
Occupational Therapy Evaluation Patient Details Name: [name] [name] MRN: [id number] DOB: Oct 19, 1926 Today's Date: 03/29/2020    History of Present Illness Pt is 87 y.o. female with medical history significant for hypertension with complications of stage III chronic kidney disease, chronic bilateral lower extremity swelling and dyslipidemia who presented to the ED via EMS on 03/27/2020 after a mechanical fall at home and persistent right hip pain with inability to bear weight on right lower extremity.  R Hip pinning performed on 03/28/20.   Clinical Impression   Ms Mccathern was seen for OT/PT co-evaluation this date. Prior to hospital admission, pt was MOD I for mobility and ADLs. Pt lives c daughter in 2 level home, able to live on main floor. Pt presents to acute OT demonstrating impaired ADL performance and functional mobility 2/2 decreased safety awareness, functional strength/ROM/balance deficits, and decreased activity tolerance. Pt currently requires MOD A x2 don B socks seated EOB - assist for posterior lean. Initial sitting balance requiring MOD A improving to CGA seated grooming. NT in to assess pt urine output, requesting assist for toilet t/f. TOTAL A x2 for SPT bed<>BSC - RLE on OT foot to maintain PWBing precautions. MAX A x2 rolling L+R perihygiene at bed level. Pt would benefit from skilled OT to address noted impairments and functional limitations (see below for any additional details) in order to maximize safety and independence while minimizing falls risk and caregiver burden. Upon hospital discharge, recommend STR to maximize pt safety and return to PLOF.     Follow Up Recommendations  SNF    Equipment Recommendations  Other (comment) (TBD)    Recommendations for Other Services       Precautions / Restrictions Precautions Precautions: Fall Restrictions Weight Bearing Restrictions: Yes RLE Weight Bearing: Partial weight bearing RLE Partial Weight Bearing Percentage or  Pounds: 10%      Mobility Bed Mobility Overal bed mobility: Needs Assistance Bed Mobility: Rolling;Supine to Sit;Sit to Supine Rolling: Max assist;+2 for physical assistance   Supine to sit: Mod assist;+2 for physical assistance Sit to supine: Total assist;+2 for physical assistance   General bed mobility comments: Pt required physical assistance for coming out of bed along with verbal and tactile cuing for hand placement.    Transfers Overall transfer level: Needs assistance Equipment used: Rolling walker (2 wheeled) Transfers: Sit to/from [facility] Sit to Stand: Mod assist;+2 physical assistance;+2 safety/equipment Stand pivot transfers: Total assist;+2 physical assistance       General transfer comment: Pt required modA +2 for standing, and still has difficulty coming upright.  Pt unable to straighten posture with use of only L LE.    Balance Overall balance assessment: Needs assistance Sitting-balance support: Single extremity supported Sitting balance-Leahy Scale: Fair   Postural control: Posterior lean Standing balance support: Bilateral upper extremity supported Standing balance-Leahy Scale: Poor                             ADL either performed or assessed with clinical judgement   ADL Overall ADL's : Needs assistance/impaired                                       General ADL Comments: MOD A x2 don B socks seated EOB - assist for posterior lean. Initial sitting balance requiring MOD A imrpoving to CGA seated grooming. TOTAL A x2 for  SPT bed<>BSC - RLE on OT foot to maintain PWBing precautions. MAX A x2 rolling L+R perihygiene at bed level                  Pertinent Vitals/Pain Pain Assessment: Faces Faces Pain Scale: Hurts little more Pain Location: R Hip Pain Descriptors / Indicators: Discomfort;Dull;Grimacing;Operative site guarding Pain Intervention(s): Limited activity within patient's  tolerance;Repositioned     Hand Dominance Right   Extremity/Trunk Assessment Upper Extremity Assessment Upper Extremity Assessment: RUE deficits/detail RUE Deficits / Details: hx of R shoulder deficits - decreased AROM however WFL grossly   Lower Extremity Assessment Lower Extremity Assessment: Generalized weakness       Communication Communication Communication: HOH   Cognition Arousal/Alertness: Awake/alert Behavior During Therapy: WFL for tasks assessed/performed Overall Cognitive Status: Difficult to assess                                 General Comments: Difficult to completely assess due to [facility].   General Comments       Exercises Exercises: Other exercises Other Exercises Other Exercises: Pt and family educated re: OT role, DME recs, d/c recs, falls prevention, ECS, HEP Other Exercises: LBD, sup<>sit, face washing, sit<>stand, SPT, rolling, sitting/standing balance/tolerance   Shoulder Instructions      Home Living Family/patient expects to be discharged to:: Private residence Living Arrangements: Children Available Help at Discharge: Family Type of Home: House Home Access: Stairs to enter     Home Layout: Two level   Alternate Level Stairs-Rails: Can reach both       Bathroom Accessibility: Yes   Home Equipment: Bedside commode          Prior Functioning/Environment Level of Independence: Independent with assistive device(s)        Comments: Pt sponge bathes at baseline, requires w/c for community mobility        OT Problem List: Decreased strength;Decreased range of motion;Decreased activity tolerance;Impaired balance (sitting and/or standing);Decreased safety awareness      OT Treatment/Interventions: Self-care/ADL training;Therapeutic exercise;Energy conservation;DME and/or AE instruction;Patient/family education;Balance training    OT Goals(Current goals can be found in the care plan section) Acute Rehab OT  Goals Patient Stated Goal: to go to SNF OT Goal Formulation: With patient/family Time For Goal Achievement: 04/12/20 Potential to Achieve Goals: Fair ADL Goals Pt Will Perform Grooming: with set-up;with supervision;sitting Pt Will Transfer to Toilet: with mod assist;with +2 assist;squat pivot transfer;bedside commode (c LRAD PRN) Pt Will Perform Toileting - Clothing Manipulation and hygiene: with mod assist;sitting/lateral leans  OT Frequency: Min 1X/week   Barriers to D/C: Inaccessible home environment          Co-evaluation PT/OT/SLP Co-Evaluation/Treatment: Yes Reason for Co-Treatment: Complexity of the patient's impairments (multi-system involvement);For patient/therapist safety;To address functional/ADL transfers PT goals addressed during session: Mobility/safety with mobility;Balance OT goals addressed during session: ADL's and self-care      AM-PAC OT "6 Clicks" Daily Activity     Outcome Measure Help from another person eating meals?: A Little Help from another person taking care of personal grooming?: A Little Help from another person toileting, which includes using toliet, bedpan, or urinal?: A Lot Help from another person bathing (including washing, rinsing, drying)?: A Lot Help from another person to put on and taking off regular upper body clothing?: A Lot Help from another person to put on and taking off regular lower body clothing?: A Lot 6 Click Score: 14  End of Session Equipment Utilized During Treatment: Surveyor, mining Communication: Mobility status  Activity Tolerance: Patient tolerated treatment well Patient left: in bed;with call bell/phone within reach;with nursing/sitter in room;with family/visitor present  OT Visit Diagnosis: Muscle weakness (generalized) (M62.81);Other abnormalities of gait and mobility (R26.89)                Time: [id number] OT Time Calculation (min): 46 min Charges:  OT General Charges $OT Visit: 1 Visit OT  Evaluation $OT Eval Moderate Complexity: 1 Mod OT Treatments $Self Care/Home Management : 23-37 mins  Dessie Coma, M.S. OTR/L  03/29/20, 3:34 PM  ascom [phone]

## 2020-03-29 NOTE — Evaluation (Signed)
Physical Therapy Evaluation Patient Details Name: [name] [name] MRN: [id number] DOB: 03-16-1926 Today's Date: 03/29/2020   History of Present Illness  Pt is 87 y.o. female with medical history significant for hypertension with complications of stage III chronic kidney disease, chronic bilateral lower extremity swelling and dyslipidemia who presented to the ED via EMS on 03/27/2020 after a mechanical fall at home and persistent right hip pain with inability to bear weight on right lower extremity.  R Hip pinning performed on 03/28/20.     Clinical Impression  Pt received in Semi-Fowler's position and agreeable to therapy.   Daughter was present in room with patient at time of therapy.  OT also arrived in room during therapy for co-treat as noted below.  Pt was able to perform bed level exercises with good technique in the bed and required modA +2 for assistance with bed mobility.  Pt had difficulty with remaining upright in seated position but was able to stabilize posture and not require assistance as time progressed.  Pt was able to perform STS x3 with modA +2 from therapist for safety.  Pt then performed seated exercises at EOB before ending session with OT.  D/c plans were already planned with MD and PT is agreeable to recommendation of SNF at this current time due to deficits and increase caregiver burden.  Pt will continue to benefit from skilled therapy while in hospital to address remaining concerns.    Follow Up Recommendations SNF;Supervision/Assistance - 24 hour    Equipment Recommendations  Rolling walker with 5" wheels    Recommendations for Other Services       Precautions / Restrictions Restrictions Weight Bearing Restrictions: Yes RLE Weight Bearing: Touchdown weight bearing      Mobility  Bed Mobility Overal bed mobility: Needs Assistance Bed Mobility: Supine to Sit     Supine to sit: Mod assist;+2 for physical assistance     General bed mobility comments: Pt  required physical assistance for coming out of bed along with verbal and tactile cuing for hand placement.    Transfers Overall transfer level: Needs assistance Equipment used: Rolling walker (2 wheeled) Transfers: Sit to/from Stand Sit to Stand: Mod assist;+2 physical assistance;+2 safety/equipment         General transfer comment: Pt required modA +2 for standing, and still has difficulty coming upright.  Pt unable to straighten posture with use of only L LE.  Ambulation/Gait             General Gait Details: deferred due to safety and with TWB.  Stairs            Wheelchair Mobility    Modified Rankin (Stroke Patients Only)       Balance Overall balance assessment: Needs assistance Sitting-balance support: Single extremity supported Sitting balance-Leahy Scale: Poor   Postural control: Posterior lean Standing balance support: Bilateral upper extremity supported Standing balance-Leahy Scale: Poor                               Pertinent Vitals/Pain Pain Assessment: Faces Faces Pain Scale: Hurts little more Pain Location: R Hip Pain Intervention(s): Limited activity within patient's tolerance;Monitored during session;Repositioned    Home Living Family/patient expects to be discharged to:: Private residence Living Arrangements: Children Available Help at Discharge: Family Type of Home: House Home Access: Stairs to enter     Home Layout: Two level Home Equipment: Bedside commode      Prior  Function Level of Independence: Independent with assistive device(s)               Hand Dominance   Dominant Hand: Right    Extremity/Trunk Assessment   Upper Extremity Assessment Upper Extremity Assessment: Defer to OT evaluation;Generalized weakness    Lower Extremity Assessment Lower Extremity Assessment: Generalized weakness       Communication   Communication: HOH  Cognition Arousal/Alertness: Awake/alert Behavior During  Therapy: WFL for tasks assessed/performed Overall Cognitive Status: Difficult to assess                                 General Comments: Difficult to completely assess due to [facility].      General Comments      Exercises General Exercises - Lower Extremity Ankle Circles/Pumps: AROM;Strengthening;Both;10 reps;Supine Hip ABduction/ADduction: AROM;Strengthening;Both;5 reps;Supine Hip Flexion/Marching: AROM;Strengthening;Both;10 reps;Seated Other Exercises Other Exercises: Pt educated on role of PT and services provided during hospital stay.   Assessment/Plan    PT Assessment Patient needs continued PT services  PT Problem List Decreased strength;Decreased range of motion;Decreased activity tolerance;Decreased balance;Decreased mobility;Decreased knowledge of use of DME;Decreased safety awareness;Decreased knowledge of precautions       PT Treatment Interventions      PT Goals (Current goals can be found in the Care Plan section)  Acute Rehab PT Goals Patient Stated Goal: to go to SNF PT Goal Formulation: With patient/family Time For Goal Achievement: 04/12/20 Potential to Achieve Goals: Good    Frequency BID   Barriers to discharge Decreased caregiver support      Co-evaluation PT/OT/SLP Co-Evaluation/Treatment: Yes Reason for Co-Treatment: For patient/therapist safety;To address functional/ADL transfers PT goals addressed during session: Mobility/safety with mobility;Balance;Proper use of DME;Strengthening/ROM OT goals addressed during session: ADL's and self-care;Proper use of Adaptive equipment and DME;Strengthening/ROM       AM-PAC PT "6 Clicks" Mobility  Outcome Measure Help needed turning from your back to your side while in a flat bed without using bedrails?: A Lot Help needed moving from lying on your back to sitting on the side of a flat bed without using bedrails?: Total Help needed moving to and from a bed to a chair (including a wheelchair)?:  Total Help needed standing up from a chair using your arms (e.g., wheelchair or bedside chair)?: Total Help needed to walk in hospital room?: Total Help needed climbing 3-5 steps with a railing? : Total 6 Click Score: 7    End of Session   Activity Tolerance: Patient limited by pain;Patient limited by fatigue Patient left: in bed;with family/visitor present;Other (comment) (with OT in room.)   PT Visit Diagnosis: Unsteadiness on feet (R26.81);Other abnormalities of gait and mobility (R26.89);Repeated falls (R29.6);Muscle weakness (generalized) (M62.81);History of falling (Z91.81);Difficulty in walking, not elsewhere classified (R26.2);Pain Pain - Right/Left: Right Pain - part of body: Leg    Time: 1413-1436 PT Time Calculation (min) (ACUTE ONLY): 23 min   Charges:   PT Evaluation $PT Eval Low Complexity: 1 Low PT Treatments $Therapeutic Exercise: 8-22 mins        Gwenlyn Saran, PT, DPT 03/29/20, 3:10 PM

## 2020-03-29 NOTE — Anesthesia Postprocedure Evaluation (Signed)
Anesthesia Post Note  Patient: [name]  Procedure(s) Performed: CANNULATED HIP PINNING (Right Hip)  Patient location during evaluation: Nursing Unit Anesthesia Type: Spinal Level of consciousness: awake, awake and alert, oriented and patient cooperative Pain management: pain level controlled Vital Signs Assessment: post-procedure vital signs reviewed and stable Respiratory status: spontaneous breathing, nonlabored ventilation and respiratory function stable Cardiovascular status: stable Postop Assessment: no headache, no backache, adequate PO intake, patient able to bend at knees and no apparent nausea or vomiting Anesthetic complications: no   No complications documented.   Last Vitals:  Vitals:   03/29/20 0538 03/29/20 0728  BP: (!) 113/55 119/60  Pulse: 80 79  Resp: 18 16  Temp: 36.7 C 36.8 C  SpO2: 91% 100%    Last Pain:  Vitals:   03/28/20 2026  TempSrc: Oral  PainSc:                  Ricki Miller

## 2020-03-29 NOTE — Progress Notes (Signed)
Pt has urinated 150 since foley was d/c'ed earlier this morning, bladder scan shows 66; MD notified.   Also pt has active bedrest orders preventing PT/OT evaluation; MD notified to change order.

## 2020-03-29 NOTE — Plan of Care (Signed)

## 2020-03-30 ENCOUNTER — Encounter: Payer: Self-pay | Admitting: Orthopedic Surgery

## 2020-03-30 LAB — BASIC METABOLIC PANEL
Anion gap: 11 (ref 5–15)
BUN: 35 mg/dL — ABNORMAL HIGH (ref 8–23)
CO2: 25 mmol/L (ref 22–32)
Calcium: 9.6 mg/dL (ref 8.9–10.3)
Chloride: 104 mmol/L (ref 98–111)
Creatinine, Ser: 1.09 mg/dL — ABNORMAL HIGH (ref 0.44–1.00)
GFR, Estimated: 48 mL/min — ABNORMAL LOW (ref >60)
Glucose, Bld: 83 mg/dL (ref 70–99)
Potassium: 4 mmol/L (ref 3.5–5.1)
Sodium: 140 mmol/L (ref 135–145)

## 2020-03-30 LAB — CBC
HCT: 32.6 % — ABNORMAL LOW (ref 36.0–46.0)
Hemoglobin: 10.5 g/dL — ABNORMAL LOW (ref 12.0–15.0)
MCH: 29.1 pg (ref 26.0–34.0)
MCHC: 32.2 g/dL (ref 30.0–36.0)
MCV: 90.3 fL (ref 80.0–100.0)
Platelets: 133 10*3/uL — ABNORMAL LOW (ref 150–400)
RBC: 3.61 MIL/uL — ABNORMAL LOW (ref 3.87–5.11)
RDW: 14.6 % (ref 11.5–15.5)
WBC: 7.9 10*3/uL (ref 4.0–10.5)
nRBC: 0 % (ref 0.0–0.2)

## 2020-03-30 LAB — MAGNESIUM: Magnesium: 1.7 mg/dL (ref 1.7–2.4)

## 2020-03-30 NOTE — Progress Notes (Signed)
Physical Therapy Treatment Patient Details Name: [name] [name] MRN: [id number] DOB: 10/04/1926 Today's Date: 03/30/2020    History of Present Illness Pt is 87 y.o. female with medical history significant for hypertension with complications of stage III chronic kidney disease, chronic bilateral lower extremity swelling and dyslipidemia who presented to the ED via EMS on 03/27/2020 after a mechanical fall at home and persistent right hip pain with inability to bear weight on right lower extremity.  R Hip pinning performed on 03/28/20.    PT Comments    Pt was long sitting in bed with RN in room and pt's supportive spouse at bedside. She agrees to session and is cooperative throughout. Does have baseline cognition deficits however cognition did not impact session progression. She was able to exit R side of bed with max assist of 1 + 2nd person close SBA for safety. Sat EOB x 10 minutes while taking medications. Was able to stand pivot to recliner. RN tech called later and requested assistance to transfer pt from recliner to [facility]. OT arrived to assist pt once she was sitting on BSC. Acute PT will continue to progress as able per POC. Recommend SNF at DC to address deficits while assisting pt to PLOF.   Follow Up Recommendations  SNF;Supervision/Assistance - 24 hour     Equipment Recommendations  Rolling walker with 5" wheels    Recommendations for Other Services       Precautions / Restrictions Precautions Precautions: Fall Restrictions Weight Bearing Restrictions: Yes RLE Weight Bearing: Partial weight bearing RLE Partial Weight Bearing Percentage or Pounds: 10%    Mobility  Bed Mobility Overal bed mobility: Needs Assistance Bed Mobility: Supine to Sit;Sit to Supine Rolling: Max assist;+2 for physical assistance   Supine to sit: Max assist;+2 for safety/equipment     General bed mobility comments: Pt was able to achieve EOB sitting with max assist + alot of incraesed time. RN and  pt's daughter present for +2 assist.    Transfers Overall transfer level: Needs assistance Equipment used: Rolling walker (2 wheeled);None Transfers: Sit to/from [facility] Sit to Stand: +2 safety/equipment;Max assist (max of one) Stand pivot transfers: Max assist;+2 safety/equipment;From elevated surface       General transfer comment: Pt struggles maintaining proper wt bearing however does give great great effort to limit wt on RLE. stood pivot to L to recliner  Ambulation/Gait  General Gait Details: unable to safely ambulate at this time due to weakness/wt bearing restrictions      Balance Overall balance assessment: Needs assistance Sitting-balance support: Feet supported Sitting balance-Leahy Scale: Fair Sitting balance - Comments: close SBA/supervision for safety. pt sat EOB x ~ 10 miinutes while taking morning medication with feet support only   Standing balance support: Bilateral upper extremity supported Standing balance-Leahy Scale: Poor Standing balance comment: required constant max assist to maintain proper wt bearing       Cognition Arousal/Alertness: Awake/alert Behavior During Therapy: WFL for tasks assessed/performed Overall Cognitive Status: Within Functional Limits for tasks assessed      General Comments: Pt is HOH but A and oriented x 3. very pleasant and cooperative throughout             Pertinent Vitals/Pain Pain Assessment: No/denies pain Faces Pain Scale: No hurt Pain Location: R Hip Pain Descriptors / Indicators: Sore Pain Intervention(s): Limited activity within patient's tolerance;Monitored during session;Repositioned           PT Goals (current goals can now be found in the  care plan section) Acute Rehab PT Goals Patient Stated Goal: to go to SNF Progress towards PT goals: Progressing toward goals    Frequency    BID      PT Plan Current plan remains appropriate    Co-evaluation     PT goals  addressed during session: Mobility/safety with mobility;Balance;Strengthening/ROM        AM-PAC PT "6 Clicks" Mobility   Outcome Measure  Help needed turning from your back to your side while in a flat bed without using bedrails?: A Lot Help needed moving from lying on your back to sitting on the side of a flat bed without using bedrails?: A Lot Help needed moving to and from a bed to a chair (including a wheelchair)?: A Lot Help needed standing up from a chair using your arms (e.g., wheelchair or bedside chair)?: A Lot Help needed to walk in hospital room?: Total Help needed climbing 3-5 steps with a railing? : Total 6 Click Score: 10    End of Session Equipment Utilized During Treatment: Gait belt Activity Tolerance: Patient tolerated treatment well Patient left: in chair;with call bell/phone within reach;with chair alarm set;with family/visitor present;with nursing/sitter in room Nurse Communication: Mobility status PT Visit Diagnosis: Unsteadiness on feet (R26.81);Other abnormalities of gait and mobility (R26.89);Repeated falls (R29.6);Muscle weakness (generalized) (M62.81);History of falling (Z91.81);Difficulty in walking, not elsewhere classified (R26.2);Pain Pain - Right/Left: Right Pain - part of body: Leg     Time: [id number] PT Time Calculation (min) (ACUTE ONLY): 33 min  Charges:  $Therapeutic Activity: 23-37 mins                     Julaine Fusi PTA 03/30/20, 12:42 PM

## 2020-03-30 NOTE — Progress Notes (Signed)
Occupational Therapy Treatment Patient Details Name: [name] [name] MRN: [id number] DOB: 06-29-1926 Today's Date: 03/30/2020    History of present illness Pt is 87 y.o. female with medical history significant for hypertension with complications of stage III chronic kidney disease, chronic bilateral lower extremity swelling and dyslipidemia who presented to the ED via EMS on 03/27/2020 after a mechanical fall at home and persistent right hip pain with inability to bear weight on right lower extremity.  R Hip pinning performed on 03/28/20.   OT comments  [name] [name] was seen for OT treatment on this date. Upon arrival to room pt seated on BSC. MAX A x2 perihygiene in standing, +2 asssit for maintaining PWBing precautions and assist from daughter for pericare. MAX A for BSC>chair t/f. Pt completed seated exercises as described below. Pt making good progress toward goals. Pt continues to benefit from skilled OT services to maximize return to PLOF and minimize risk of future falls, injury, caregiver burden, and readmission. Will continue to follow POC. Discharge recommendation remains appropriate.     Follow Up Recommendations  SNF    Equipment Recommendations  Other (comment) (TBD)    Recommendations for Other Services      Precautions / Restrictions Precautions Precautions: Fall Restrictions Weight Bearing Restrictions: Yes RLE Weight Bearing: Partial weight bearing RLE Partial Weight Bearing Percentage or Pounds: 10%       Mobility Bed Mobility               General bed mobility comments: Pt received on BSC and left in chair  Transfers Overall transfer level: Needs assistance Equipment used: Rolling walker (2 wheeled);None Transfers: Sit to/from [facility] Sit to Stand: +2 safety/equipment;Max assist Stand pivot transfers: Max assist;+2 safety/equipment;From elevated surface       General transfer comment: MAX A for WBing    Balance Overall balance  assessment: Needs assistance Sitting-balance support: Feet supported Sitting balance-Leahy Scale: Fair Sitting balance - Comments: SUPERVISION seated EOC   Standing balance support: Bilateral upper extremity supported Standing balance-Leahy Scale: Poor Standing balance comment: required constant max assist to maintain proper wt bearing                           ADL either performed or assessed with clinical judgement   ADL Overall ADL's : Needs assistance/impaired                                       General ADL Comments: Upon arrival pt seated on BSC. MAX A x2 perihygiene in standing - asssit for maintaining PWBing precautions and assist from daughter for pericare. MAX A for BSC>chair t/f               Cognition Arousal/Alertness: Awake/alert Behavior During Therapy: WFL for tasks assessed/performed Overall Cognitive Status: Within Functional Limits for tasks assessed                                 General Comments: Pt is HOH but A and oriented x 3. very pleasant and cooperative throughout        Exercises Exercises: Other exercises;General Lower Extremity General Exercises - Lower Extremity Ankle Circles/Pumps: AROM;Strengthening;Both;10 reps;Seated Long Arc Quad: AROM;Strengthening;Both;10 reps;Seated Hip Flexion/Marching: AROM;Strengthening;Both;10 reps;Seated Other Exercises Other Exercises: Pt and family educated re: OT role, DME recs,  d/c recs, falls prevention, ECS, HEP Other Exercises: LBD, face washing, toileting, sit<>stand, SPT, rolling, sitting/standing balance/tolerance           Pertinent Vitals/ Pain       Pain Assessment: No/denies pain         Frequency  Min 1X/week        Progress Toward Goals  OT Goals(current goals can now be found in the care plan section)  Progress towards OT goals: Progressing toward goals  Acute Rehab OT Goals Patient Stated Goal: to go to SNF OT Goal Formulation: With  patient/family Time For Goal Achievement: 04/12/20 Potential to Achieve Goals: Fair ADL Goals Pt Will Perform Grooming: with set-up;with supervision;sitting Pt Will Transfer to Toilet: with mod assist;with +2 assist;squat pivot transfer;bedside commode Pt Will Perform Toileting - Clothing Manipulation and hygiene: with mod assist;sitting/lateral leans  Plan Discharge plan remains appropriate;Frequency remains appropriate       AM-PAC OT "6 Clicks" Daily Activity     Outcome Measure   Help from another person eating meals?: A Little Help from another person taking care of personal grooming?: A Little Help from another person toileting, which includes using toliet, bedpan, or urinal?: A Lot Help from another person bathing (including washing, rinsing, drying)?: A Lot Help from another person to put on and taking off regular upper body clothing?: A Lot Help from another person to put on and taking off regular lower body clothing?: A Lot 6 Click Score: 14    End of Session    OT Visit Diagnosis: Muscle weakness (generalized) (M62.81);Other abnormalities of gait and mobility (R26.89)   Activity Tolerance Patient tolerated treatment well   Patient Left in chair;with call bell/phone within reach;with chair alarm set;with family/visitor present;with nursing/sitter in room   Nurse Communication Mobility status        Time: 1110-1134 OT Time Calculation (min): 24 min  Charges: OT General Charges $OT Visit: 1 Visit OT Treatments $Self Care/Home Management : 8-22 mins $Therapeutic Exercise: 8-22 mins  Dessie Coma, M.S. OTR/L  03/30/20, 1:28 PM  ascom [phone]

## 2020-03-30 NOTE — Progress Notes (Signed)
Subjective:  POD #2 s/p right hip percutaneous screw fixation of femoral neck fracture.   Patient reports right hip pain as mild to moderate.  Patient sitting up out of bed to a chair.  She is eating lunch.  Her daughter is at the bedside.  There are no acute issues.  Objective:   VITALS:   Vitals:   03/30/20 0047 03/30/20 0539 03/30/20 0829 03/30/20 1210  BP: 132/63 140/63 (!) 138/53 (!) 124/58  Pulse: 79 81 77 80  Resp: '16 16 17 16  '$ Temp: 99.5 F (37.5 C) 98.2 F (36.8 C) 98.8 F (37.1 C) 98.2 F (36.8 C)  TempSrc: Oral  Oral Oral  SpO2: 96% 96% 95% 92%  Weight:      Height:        PHYSICAL EXAM: Right lower extremity Neurovascular intact Sensation intact distally Intact pulses distally Dorsiflexion/Plantar flexion intact Incision: dressing C/D/I No cellulitis present Compartment soft  LABS  Results for orders placed or performed during the hospital encounter of 03/27/20 (from the past 24 hour(s))  Basic metabolic panel     Status: Abnormal   Collection Time: 03/30/20  5:58 AM  Result Value Ref Range   Sodium 140 135 - 145 mmol/L   Potassium 4.0 3.5 - 5.1 mmol/L   Chloride 104 98 - 111 mmol/L   CO2 25 22 - 32 mmol/L   Glucose, Bld 83 70 - 99 mg/dL   BUN 35 (H) 8 - 23 mg/dL   Creatinine, Ser 1.09 (H) 0.44 - 1.00 mg/dL   Calcium 9.6 8.9 - 10.3 mg/dL   GFR, Estimated 48 (L) >60 mL/min   Anion gap 11 5 - 15  CBC     Status: Abnormal   Collection Time: 03/30/20  5:58 AM  Result Value Ref Range   WBC 7.9 4.0 - 10.5 K/uL   RBC 3.61 (L) 3.87 - 5.11 MIL/uL   Hemoglobin 10.5 (L) 12.0 - 15.0 g/dL   HCT 32.6 (L) 36.0 - 46.0 %   MCV 90.3 80.0 - 100.0 fL   MCH 29.1 26.0 - 34.0 pg   MCHC 32.2 30.0 - 36.0 g/dL   RDW 14.6 11.5 - 15.5 %   Platelets 133 (L) 150 - 400 K/uL   nRBC 0.0 0.0 - 0.2 %  Magnesium     Status: None   Collection Time: 03/30/20  5:58 AM  Result Value Ref Range   Magnesium 1.7 1.7 - 2.4 mg/dL    DG Hip Port Unilat With Pelvis 1V  Right  Result Date: 03/28/2020 CLINICAL DATA:  Right hip fracture status post pinning EXAM: DG HIP (WITH OR WITHOUT PELVIS) 1V PORT RIGHT COMPARISON:  03/27/2020 FINDINGS: Frontal and cross-table lateral views of the right hip are obtained. Three cannulated screws traverse the subcapital fracture seen previously, with anatomic alignment. Postsurgical changes are seen in the overlying soft tissues. The remainder of the bony pelvis is unremarkable. IMPRESSION: 1. Subcapital right femoral neck fracture status post pinning. Anatomic alignment. Electronically Signed   By: Randa Ngo M.D.   On: 03/28/2020 16:07   DG HIP OPERATIVE UNILAT W OR W/O PELVIS RIGHT  Result Date: 03/28/2020 CLINICAL DATA:  Right hip fracture EXAM: OPERATIVE RIGHT HIP WITH PELVIS COMPARISON:  03/27/2020 FLUOROSCOPY TIME:  Radiation Exposure Index (as provided by the fluoroscopic device): Not available If the device does not provide the exposure index: Fluoroscopy Time:  47 seconds Number of Acquired Images:  2 FINDINGS: Three compression screws are noted traversing the femoral neck  on the right. Fracture fragments are in near anatomic alignment. IMPRESSION: ORIF of right femoral fracture. Electronically Signed   By: Inez Catalina M.D.   On: 03/28/2020 15:25    Assessment/Plan: 2 Days Post-Op   Principal Problem:   Closed displaced fracture of right femoral neck (HCC) Active Problems:   Hypertension   Hypothyroid   CKD (chronic kidney disease) stage 4, GFR 15-29 ml/min ([place])   Cellulitis   Patient doing well from orthopedic standpoint.  Continue 10% weightbearing on the right lower extremity.  Continue physical therapy.  There are no right hip range of motion limitations to the patient.  Patient may be discharged to a skilled nursing facility whenever cleared by medicine.  Recommend either Lovenox 30 mg daily for DVT prophylaxis or enteric-coated aspirin 325 mg p.o. twice daily.  Patient will follow up with me in the office in  10 to 14 days after discharge.  Continue 10% partial weightbearing restriction on the right lower extremity until her return to the office.   Thornton Park , MD 03/30/2020, 12:59 PM

## 2020-03-30 NOTE — Progress Notes (Signed)
PROGRESS NOTE    [name] [name]   [id number]  DOB: 1926/03/28  PCP: Birdie Sons, MD    DOA: 03/27/2020 LOS: 3   Brief Narrative   87 y.o. female with medical history significant for hypertension with complications of stage III chronic kidney disease, chronic bilateral lower extremity swelling and dyslipidemia who presented to the ED via EMS on 03/27/2020 after a mechanical fall at home and persistent right hip pain with inability to bear weight on right lower extremity.    Right femur x-ray in the ED showed an acute mildly angulated and impacted subcapital right femoral neck fracture.  Admitted to hospitalist service with orthopedics consulted. Underwent surgical repair of the right femoral neck fracture on 03/28/2020 by percutaneous fixation.      Assessment & Plan   Principal Problem:   Closed displaced fracture of right femoral neck (HCC) Active Problems:   Hypertension   Hypothyroid   CKD (chronic kidney disease) stage 4, GFR 15-29 ml/min (HCC)   Cellulitis   Closed is displaced right femoral neck fracture -present on admission due to mechanical fall. Orthopedics is consulted in taking patient to surgery today. --Pain control as needed --Bowel regimen --PT evaluation after surgery, anticipate need for rehab --Continue muscle relaxant  Right lower extremity cellulitis -present on admission with erythematous distal right lower extremity.  Admitting physician also noted differential warmth in that area.  I do not appreciate this today however. Patient was afebrile but did have some mild leukocytosis. --On Ancef -continue.  If clinically improving likely discharge on Keflex.   --Monitor clinically and daily CBC  Essential hypertension -takes HCTZ, irbesartan and amlodipine, continued.  CKD stage IV -due to hypertension.  Renal function near baseline.  Monitor BMP  Hypothyroidism -continue Synthroid  GERD -continue Protonix  History of gout -not acutely  flared.  Continue allopurinol    DVT prophylaxis: SCDs Start: 03/29/20 1053 enoxaparin (LOVENOX) injection 30 mg Start: 03/29/20 1000   Diet:  Diet Orders (From admission, onward)    Start     Ordered   03/29/20 1053  Diet regular Room service appropriate? Yes; Fluid consistency: Thin  Diet effective now       Question Answer Comment  Room service appropriate? Yes   Fluid consistency: Thin      03/29/20 1052            Code Status: DNR    Subjective 03/30/20    Patient seen today with daughter at bedside. Patient up in chair, had just worked with therapy.  She reports pain only with movement, none at rest.  No other complaints.   Disposition Plan & Communication   Status is: Inpatient  Remains inpatient appropriate because:Inpatient level of care appropriate due to severity of illness. On IV antibiotics for cellulitis.  Patient requires SNF placement for rehab s/p repair of hip fracture.   Dispo: The patient is from: Home              Anticipated d/c is to: Home with [facility] versus SNF              Anticipated d/c date is: 1 day              Patient currently is not medically stable to d/c.   Difficult to place patient No   Family Communication: Daughter at bedside on rounds today   Consults, Procedures, Significant Events   Consultants:   Orthopedics  Procedures:   None  Antimicrobials:  Anti-infectives (From  admission, onward)   Start     Dose/Rate Route Frequency Ordered Stop   03/27/20 1922  ceFAZolin (ANCEF) IVPB 2g/100 mL premix        2 g 200 mL/hr over 30 Minutes Intravenous 30 min pre-op 03/27/20 1923 03/28/20 1423   03/27/20 1600  ceFAZolin (ANCEF) IVPB 1 g/50 mL premix        1 g 100 mL/hr over 30 Minutes Intravenous Every 12 hours 03/27/20 1500          Objective   Vitals:   03/30/20 0829 03/30/20 1210 03/30/20 1448 03/30/20 2053  BP: (!) 138/53 (!) 124/58 (!) 103/58 (!) 116/94  Pulse: 77 80 76 79  Resp: '17 16 20 16  '$ Temp: 98.8 F  (37.1 C) 98.2 F (36.8 C) 98.1 F (36.7 C) 98.5 F (36.9 C)  TempSrc: Oral Oral Oral Oral  SpO2: 95% 92% 96% 91%  Weight:      Height:        Intake/Output Summary (Last 24 hours) at 03/30/2020 2146 Last data filed at 03/30/2020 1357 Gross per 24 hour  Intake 240 ml  Output 650 ml  Net -410 ml   Filed Weights   03/27/20 1215  Weight: 62.6 kg    Physical Exam:  General exam: awake, alert, no acute distress Respiratory system: CTAB, no wheezes, rales or rhonchi, normal respiratory effort. Cardiovascular system: RRR, normal S1-S2. Gastrointestinal system: soft, NT Central nervous system: no gross focal neurologic deficits, normal speech Skin: dry, intact, improved erythema of distal RLE    Labs   Data Reviewed: I have personally reviewed following labs and imaging studies  CBC: Recent Labs  Lab 03/27/20 1057 03/28/20 0556 03/29/20 0606 03/30/20 0558  WBC 11.9* 7.7 8.4 7.9  NEUTROABS 9.4*  --   --   --   HGB 12.0 10.9* 10.2* 10.5*  HCT 37.2 33.5* 31.7* 32.6*  MCV 90.3 89.8 90.3 90.3  PLT 137* 137* 125* Q000111Q*   Basic Metabolic Panel: Recent Labs  Lab 03/27/20 1057 03/28/20 0556 03/29/20 0606 03/30/20 0558  NA 140 138 140 140  K 3.7 3.7 3.4* 4.0  CL 107 103 102 104  CO2 21* '26 26 25  '$ GLUCOSE 99 85 79 83  BUN 47* 34* 32* 35*  CREATININE 1.36* 1.16* 1.10* 1.09*  CALCIUM 9.8 9.2 9.4 9.6  MG  --   --   --  1.7   GFR: Estimated Creatinine Clearance: 26.3 mL/min (A) (by C-G formula based on SCr of 1.09 mg/dL (H)). Liver Function Tests: No results for input(s): AST, ALT, ALKPHOS, BILITOT, PROT, ALBUMIN in the last 168 hours. No results for input(s): LIPASE, AMYLASE in the last 168 hours. No results for input(s): AMMONIA in the last 168 hours. Coagulation Profile: Recent Labs  Lab 03/27/20 1346  INR 1.0   Cardiac Enzymes: No results for input(s): CKTOTAL, CKMB, CKMBINDEX, TROPONINI in the last 168 hours. BNP (last 3 results) No results for input(s):  PROBNP in the last 8760 hours. HbA1C: No results for input(s): HGBA1C in the last 72 hours. CBG: Recent Labs  Lab 03/27/20 2114  GLUCAP 116*   Lipid Profile: No results for input(s): CHOL, HDL, LDLCALC, TRIG, CHOLHDL, LDLDIRECT in the last 72 hours. Thyroid Function Tests: No results for input(s): TSH, T4TOTAL, FREET4, T3FREE, THYROIDAB in the last 72 hours. Anemia Panel: No results for input(s): VITAMINB12, FOLATE, FERRITIN, TIBC, IRON, RETICCTPCT in the last 72 hours. Sepsis Labs: No results for input(s): PROCALCITON, LATICACIDVEN in the last 168  hours.  Recent Results (from the past 240 hour(s))  Resp Panel by RT-PCR (Flu A&B, Covid) Nasopharyngeal Swab     Status: None   Collection Time: 03/27/20 10:57 AM   Specimen: Nasopharyngeal Swab; Nasopharyngeal(NP) swabs in vial transport medium  Result Value Ref Range Status   SARS Coronavirus 2 by RT PCR NEGATIVE NEGATIVE Final    Comment: (NOTE) SARS-CoV-2 target nucleic acids are NOT DETECTED.  The SARS-CoV-2 RNA is generally detectable in upper respiratory specimens during the acute phase of infection. The lowest concentration of SARS-CoV-2 viral copies this assay can detect is 138 copies/mL. A negative result does not preclude SARS-Cov-2 infection and should not be used as the sole basis for treatment or other patient management decisions. A negative result may occur with  improper specimen collection/handling, submission of specimen other than nasopharyngeal swab, presence of viral mutation(s) within the areas targeted by this assay, and inadequate number of viral copies(<138 copies/mL). A negative result must be combined with clinical observations, patient history, and epidemiological information. The expected result is Negative.  Fact Sheet for Patients:  EntrepreneurPulse.com.au  Fact Sheet for Healthcare Providers:  IncredibleEmployment.be  This test is no t yet approved or  cleared by the [place] FDA and  has been authorized for detection and/or diagnosis of SARS-CoV-2 by FDA under an Emergency Use Authorization (EUA). This EUA will remain  in effect (meaning this test can be used) for the duration of the COVID-19 declaration under Section 564(b)(1) of the Act, 21 U.S.C.section 360bbb-3(b)(1), unless the authorization is terminated  or revoked sooner.       Influenza A by PCR NEGATIVE NEGATIVE Final   Influenza B by PCR NEGATIVE NEGATIVE Final    Comment: (NOTE) The Xpert Xpress SARS-CoV-2/FLU/RSV plus assay is intended as an aid in the diagnosis of influenza from Nasopharyngeal swab specimens and should not be used as a sole basis for treatment. Nasal washings and aspirates are unacceptable for Xpert Xpress SARS-CoV-2/FLU/RSV testing.  Fact Sheet for Patients: EntrepreneurPulse.com.au  Fact Sheet for Healthcare Providers: IncredibleEmployment.be  This test is not yet approved or cleared by the [place] FDA and has been authorized for detection and/or diagnosis of SARS-CoV-2 by FDA under an Emergency Use Authorization (EUA). This EUA will remain in effect (meaning this test can be used) for the duration of the COVID-19 declaration under Section 564(b)(1) of the Act, 21 U.S.C. section 360bbb-3(b)(1), unless the authorization is terminated or revoked.  Performed at [facility], [address] [place], [place] [address]   Culture, blood (routine x 2)     Status: None (Preliminary result)   Collection Time: 03/27/20  3:27 PM   Specimen: BLOOD  Result Value Ref Range Status   Specimen Description BLOOD BLOOD RIGHT FOREARM  Final   Special Requests   Final    BOTTLES DRAWN AEROBIC AND ANAEROBIC Blood Culture adequate volume   Culture   Final    NO GROWTH 3 DAYS Performed at [facility], [address], [place], [place] [address]    Report Status PENDING  Incomplete  Culture,  blood (routine x 2)     Status: None (Preliminary result)   Collection Time: 03/27/20  3:37 PM   Specimen: BLOOD  Result Value Ref Range Status   Specimen Description BLOOD LEFT ANTECUBITAL  Final   Special Requests   Final    BOTTLES DRAWN AEROBIC AND ANAEROBIC Blood Culture adequate volume   Culture   Final    NO GROWTH 3  DAYS Performed at [facility], [address], [place],  [address]    Report Status PENDING  Incomplete      Imaging Studies   No results found.   Medications   Scheduled Meds: . allopurinol  200 mg Oral Daily  . amLODipine  5 mg Oral Daily  . calcium-vitamin D  1 tablet Oral BID  . Chlorhexidine Gluconate Cloth  6 each Topical Daily  . docusate sodium  100 mg Oral BID  . enoxaparin (LOVENOX) injection  30 mg Subcutaneous Q24H  . feeding supplement  237 mL Oral BID BM  . irbesartan  300 mg Oral Daily   And  . hydrochlorothiazide  25 mg Oral Daily  . levothyroxine  88 mcg Oral Q0600  . multivitamin-lutein  1 capsule Oral BID  . pantoprazole  40 mg Oral Daily  . senna  1 tablet Oral BID   Continuous Infusions: . sodium chloride 500 mL (03/29/20 0423)  .  ceFAZolin (ANCEF) IV 1 g (03/30/20 1703)  . methocarbamol (ROBAXIN) IV         LOS: 3 days    Time spent: 20 minutes    Ezekiel Slocumb, DO [facility]  03/30/2020, 9:46 PM      If 7PM-7AM, please contact night-coverage. How to contact the [facility] Attending or Consulting provider [place] or covering provider during after hours [employer], for this patient?    1. Check the care team in [facility] and look for a) attending/consulting TRH provider listed and b) the [facility] team listed 2. Log into [email or web address] and use [place]'s universal password to access. If you do not have the password, please contact the hospital operator. 3. Locate the [facility] provider you are looking for under [facility] and page to a number that you can be directly reached. 4. If you still have  difficulty reaching the provider, please page the [facility] (Director on Call) for the Hospitalists listed on amion for assistance.

## 2020-03-30 NOTE — Progress Notes (Signed)
Physical Therapy Treatment Patient Details Name: [name] [name] MRN: [id number] DOB: 1926/11/29 Today's Date: 03/30/2020    History of Present Illness Pt is 87 y.o. female with medical history significant for hypertension with complications of stage III chronic kidney disease, chronic bilateral lower extremity swelling and dyslipidemia who presented to the ED via EMS on 03/27/2020 after a mechanical fall at home and persistent right hip pain with inability to bear weight on right lower extremity.  R Hip pinning performed on 03/28/20.    PT Comments    Author contacted by RN staff for assistance with getting pt BSC then back to bed. She continued to require max assist to stand with total assist to perform hygiene/pericare after BM. She stood pivot back to bed with max assist fo one + 2nd person close for safety. Pt does have pain/soreness this afternoon that she did not have earlier in the day. RN and daughter in room at conclusion of session. HEP handout issued and per pt's daughter, " I will make her do them several times throughout the remainder of day." Acute PT will continue to follow and progress as able per POC.    Follow Up Recommendations  SNF;Supervision/Assistance - 24 hour     Equipment Recommendations  Other (comment) (defer to next level of care)    Recommendations for Other Services       Precautions / Restrictions Precautions Precautions: Fall Restrictions Weight Bearing Restrictions: Yes RLE Weight Bearing: Partial weight bearing RLE Partial Weight Bearing Percentage or Pounds: 10%    Mobility  Bed Mobility Overal bed mobility: Needs Assistance Bed Mobility: Sit to Supine       Sit to supine: Total assist;+2 for safety/equipment   General bed mobility comments: Pt was very fatigued after performing stand pivot transfers x 3. total assist to safely return and reposition in bed after OOB activity    Transfers Overall transfer level: Needs assistance Equipment  used: Rolling walker (2 wheeled);None Transfers: Sit to/from Stand Sit to Stand: +2 safety/equipment;Max assist Stand pivot transfers: Max assist;+2 safety/equipment       General transfer comment: Pt was able to stand pivot to  Ambulation/Gait      General Gait Details: only safe to stand pivot due to pt's inability to safely maintain proper PWB       Balance Overall balance assessment: Needs assistance Sitting-balance support: Feet supported Sitting balance-Leahy Scale: Fair Sitting balance - Comments: SUPERVISION seated EOC   Standing balance support: Bilateral upper extremity supported;During functional activity Standing balance-Leahy Scale: Poor Standing balance comment: max assist         Cognition Arousal/Alertness: Awake/alert Behavior During Therapy: WFL for tasks assessed/performed Overall Cognitive Status: Within Functional Limits for tasks assessed        General Comments: Pt is HOH but A and oriented x 3. very pleasant and cooperative throughout      Exercises General Exercises - Lower Extremity Ankle Circles/Pumps: AROM;Strengthening;Both;10 reps;Seated Long Arc Quad: AROM;Strengthening;Both;10 reps;Seated Hip Flexion/Marching: AROM;Strengthening;Both;10 reps;Seated Other Exercises Other Exercises: Pt and family educated re: OT role, DME recs, d/c recs, falls prevention, ECS, HEP Other Exercises: LBD, face washing, toileting, sit<>stand, SPT, rolling, sitting/standing balance/tolerance        Pertinent Vitals/Pain Pain Assessment: Faces Faces Pain Scale: Hurts a little bit Pain Location: R Hip Pain Descriptors / Indicators: Discomfort;Grimacing Pain Intervention(s): Limited activity within patient's tolerance;Monitored during session;Repositioned           PT Goals (current goals can now be found in  the care plan section) Acute Rehab PT Goals Patient Stated Goal: none stated Progress towards PT goals: Progressing toward goals     Frequency    BID      PT Plan Current plan remains appropriate    Co-evaluation     PT goals addressed during session: Mobility/safety with mobility        AM-PAC PT "6 Clicks" Mobility   Outcome Measure  Help needed turning from your back to your side while in a flat bed without using bedrails?: A Lot Help needed moving from lying on your back to sitting on the side of a flat bed without using bedrails?: A Lot Help needed moving to and from a bed to a chair (including a wheelchair)?: A Lot Help needed standing up from a chair using your arms (e.g., wheelchair or bedside chair)?: A Lot Help needed to walk in hospital room?: Total Help needed climbing 3-5 steps with a railing? : Total 6 Click Score: 10    End of Session Equipment Utilized During Treatment: Gait belt Activity Tolerance: Patient tolerated treatment well;Patient limited by fatigue Patient left: in bed;with call bell/phone within reach;with bed alarm set;with nursing/sitter in room;with family/visitor present Nurse Communication: Mobility status PT Visit Diagnosis: Unsteadiness on feet (R26.81);Other abnormalities of gait and mobility (R26.89);Repeated falls (R29.6);Muscle weakness (generalized) (M62.81);History of falling (Z91.81);Difficulty in walking, not elsewhere classified (R26.2);Pain Pain - Right/Left: Right Pain - part of body: Leg     Time: [id number] PT Time Calculation (min) (ACUTE ONLY): 20 min  Charges:  $Therapeutic Activity: 8-22 mins                     Julaine Fusi PTA 03/30/20, 3:12 PM

## 2020-03-30 NOTE — Care Management Important Message (Signed)
Important Message  Patient Details  Name: [name] [name] MRN: [id number] Date of Birth: 1926/11/18   Medicare Important Message Given:  Yes     Dannette Barbara 03/30/2020, 10:59 AM

## 2020-03-30 NOTE — TOC Initial Note (Signed)
Transition of Care [facility]) - Initial/Assessment Note    Patient Details  Name: [name] [name] MRN: [id number] Date of Birth: 06/07/1926  [facility] CM/SW Contact:    Shelbie Ammons, RN Phone Number: 03/30/2020, 9:13 AM  Clinical Narrative:   RNCM met with patient and daughter in room. Patient is alert and pleasant and reports to feeling fairly well this morning. Patient lives at home with her daughter who is her primary caregiver. She is agreeable to SNF work up but reports it will be her plan to return home once she has completed rehab. Daughter is agreeable to a search of all facilities except [facility] and would like for her mother to go to [facility] if available.  RNCM completed PASSR, FL2 and started bed search.               Expected Discharge Plan: Skilled Nursing Facility Barriers to Discharge: No Barriers Identified   Patient Goals and CMS Choice        Expected Discharge Plan and Services Expected Discharge Plan: [place] Acute Care Choice: [place] Living arrangements for the past 2 months: Single Family Home                                      Prior Living Arrangements/Services Living arrangements for the past 2 months: Single Family Home Lives with:: Adult Children Patient language and need for interpreter reviewed:: Yes Do you feel safe going back to the place where you live?: Yes      Need for Family Participation in Patient Care: Yes (Comment) Care giver support system in place?: Yes (comment)   Criminal Activity/Legal Involvement Pertinent to Current Situation/Hospitalization: No - Comment as needed  Activities of Daily Living Home Assistive Devices/Equipment: Hearing aid,Walker (specify type),Dentures (specify type) ADL Screening (condition at time of admission) Patient's cognitive ability adequate to safely complete daily activities?: No Is the patient deaf or have difficulty  hearing?: Yes Does the patient have difficulty seeing, even when wearing glasses/contacts?: Yes Does the patient have difficulty concentrating, remembering, or making decisions?: Yes Patient able to express need for assistance with ADLs?: No Does the patient have difficulty dressing or bathing?: Yes Independently performs ADLs?: No Communication: Independent Dressing (OT): Dependent Is this a change from baseline?: Pre-admission baseline Grooming: Dependent Is this a change from baseline?: Pre-admission baseline Feeding: Independent with device (comment) Bathing: Dependent Is this a change from baseline?: Pre-admission baseline Toileting: Dependent Is this a change from baseline?: Pre-admission baseline In/Out Bed: Dependent Is this a change from baseline?: Pre-admission baseline Walks in Home: Independent with device (comment) (generally ambulates with walker prior to fall) Does the patient have difficulty walking or climbing stairs?: Yes Weakness of Legs: Both Weakness of Arms/Hands: Both  Permission Sought/Granted                  Emotional Assessment Appearance:: Appears stated age   Affect (typically observed): Appropriate,Calm Orientation: : Oriented to Self,Oriented to Place,Oriented to  Time,Oriented to Situation Alcohol / Substance Use: Not Applicable Psych Involvement: No (comment)  Admission diagnosis:  Pre-op chest exam [W23.762] Closed fracture of neck of right femur, initial encounter ([place]) [S72.001A] Fall, initial encounter [W19.XXXA] Closed displaced fracture of right femoral neck ([place]) [S72.001A] Patient Active Problem List   Diagnosis Date Noted  . Closed displaced fracture of right femoral  neck ([place]) 03/27/2020  . CKD (chronic kidney disease) stage 4, GFR 15-29 ml/min (HCC) 03/27/2020  . Cellulitis 03/27/2020  . Localized edema 01/10/2020  . Peripheral vascular disease ([place]) 06/14/2019  . Female bladder prolapse 05/13/2019  . Constipation  08/11/2018  . Right foot ulcer ([place]) 01/24/2015  . Chronic kidney disease (CKD), stage III (moderate) ([place]) 08/16/2014  . Esophageal reflux 08/16/2014  . Obesity 08/16/2014  . Osteopenia 08/16/2014  . Varicose veins of both legs with edema 08/16/2014  . Prediabetes 10/02/2008  . CAD in native artery 09/26/2008  . Gout 09/26/2008  . History of tobacco use 09/26/2008  . Hypertension 09/26/2008  . Hypothyroid 09/26/2008  . Hypercholesterolemia without hypertriglyceridemia 09/26/2008   PCP:  Birdie Sons, MD Pharmacy:   [place], [place] [address] Phone: [phone] Fax: [phone]     Social Determinants of Health (SDOH) Interventions    Readmission Risk Interventions No flowsheet data found.

## 2020-03-30 NOTE — NC FL2 (Signed)
[place] LEVEL OF CARE SCREENING TOOL     IDENTIFICATION  Patient Name: [name] [name] Birthdate: 06-02-1926 Sex: female Admission Date (Current Location): 03/27/2020  [place] and [place] Number:  Engineering geologist and Address:  [facility], [address], [place], [place] [address]      Provider Number: [id number]  Attending Physician Name and Address:  Ezekiel Slocumb, DO  Relative Name and Phone Number:  Nada Libman [phone]    Current Level of Care: Hospital Recommended Level of Care: [place] Prior Approval Number:    Date Approved/Denied:   PASRR Number: [id number]  Discharge Plan: SNF    Current Diagnoses: Patient Active Problem List   Diagnosis Date Noted  . Closed displaced fracture of right femoral neck ([place]) 03/27/2020  . CKD (chronic kidney disease) stage 4, GFR 15-29 ml/min (HCC) 03/27/2020  . Cellulitis 03/27/2020  . Localized edema 01/10/2020  . Peripheral vascular disease ([place]) 06/14/2019  . Female bladder prolapse 05/13/2019  . Constipation 08/11/2018  . Right foot ulcer ([place]) 01/24/2015  . Chronic kidney disease (CKD), stage III (moderate) ([place]) 08/16/2014  . Esophageal reflux 08/16/2014  . Obesity 08/16/2014  . Osteopenia 08/16/2014  . Varicose veins of both legs with edema 08/16/2014  . Prediabetes 10/02/2008  . CAD in native artery 09/26/2008  . Gout 09/26/2008  . History of tobacco use 09/26/2008  . Hypertension 09/26/2008  . Hypothyroid 09/26/2008  . Hypercholesterolemia without hypertriglyceridemia 09/26/2008    Orientation RESPIRATION BLADDER Height & Weight     Self,Time,Situation,Place  Normal External catheter Weight: 62.6 kg Height:  '4\' 10"'$  (147.3 cm)  BEHAVIORAL SYMPTOMS/MOOD NEUROLOGICAL BOWEL NUTRITION STATUS      Continent Diet (Regular)  AMBULATORY STATUS COMMUNICATION OF NEEDS Skin   Extensive Assist Verbally Surgical wounds                        Personal Care Assistance Level of Assistance  Bathing,Feeding,Dressing Bathing Assistance: Maximum assistance Feeding assistance: Limited assistance Dressing Assistance: Maximum assistance     Functional Limitations Info  Sight,Hearing,Speech Sight Info: Adequate Hearing Info:  (Hard of Hearing) Speech Info: Adequate    SPECIAL CARE FACTORS FREQUENCY  PT (By licensed PT),OT (By licensed OT)                    Contractures Contractures Info: Not present    Additional Factors Info  Code Status,Allergies Code Status Info: DNR Allergies Info: Atorvastatin, Lovastatin, Simvastatin           Current Medications (03/30/2020):  This is the current hospital active medication list Current Facility-Administered Medications  Medication Dose Route Frequency Provider Last Rate Last Admin  . 0.9 %  sodium chloride infusion   Intravenous PRN Thornton Park, MD 10 mL/hr at 03/29/20 0423 500 mL at 03/29/20 0423  . acetaminophen (TYLENOL) tablet 500 mg  500 mg Oral Q6H Thornton Park, MD   500 mg at 03/29/20 1705  . allopurinol (ZYLOPRIM) tablet 200 mg  200 mg Oral Daily Thornton Park, MD   200 mg at 03/29/20 2210  . alum & mag hydroxide-simeth (MAALOX/MYLANTA) 200-200-20 MG/5ML suspension 30 mL  30 mL Oral Q4H PRN Thornton Park, MD      . amLODipine (NORVASC) tablet 5 mg  5 mg Oral Daily Thornton Park, MD   5 mg at 03/30/20 0856  . bisacodyl (DULCOLAX) suppository 10 mg  10 mg Rectal Daily PRN Thornton Park, MD      .  calcium-vitamin D (OSCAL WITH D) 500-200 MG-UNIT per tablet 1 tablet  1 tablet Oral BID Thornton Park, MD   1 tablet at 03/30/20 0856  . ceFAZolin (ANCEF) IVPB 1 g/50 mL premix  1 g Intravenous Q12H Thornton Park, MD 100 mL/hr at 03/29/20 1545 1 g at 03/29/20 1545  . Chlorhexidine Gluconate Cloth 2 % PADS 6 each  6 each Topical Daily Nicole Kindred A, DO   6 each at 03/29/20 1000  . docusate sodium (COLACE) capsule 100 mg  100 mg Oral BID  Thornton Park, MD   100 mg at 03/30/20 0857  . enoxaparin (LOVENOX) injection 30 mg  30 mg Subcutaneous Q24H Rauer, Samantha O, RPH   30 mg at 03/30/20 0857  . feeding supplement (ENSURE ENLIVE / ENSURE PLUS) liquid 237 mL  237 mL Oral BID BM Nicole Kindred A, DO   237 mL at 03/30/20 0857  . irbesartan (AVAPRO) tablet 300 mg  300 mg Oral Daily Thornton Park, MD   300 mg at 03/30/20 0856   And  . hydrochlorothiazide (HYDRODIURIL) tablet 25 mg  25 mg Oral Daily Thornton Park, MD   25 mg at 03/29/20 0945  . levothyroxine (SYNTHROID) tablet 88 mcg  88 mcg Oral Q0600 Thornton Park, MD   88 mcg at 03/30/20 0856  . magnesium citrate solution 1 Bottle  1 Bottle Oral Once PRN Thornton Park, MD      . methocarbamol (ROBAXIN) tablet 500 mg  500 mg Oral Q6H PRN Thornton Park, MD       Or  . methocarbamol (ROBAXIN) 500 mg in dextrose 5 % 50 mL IVPB  500 mg Intravenous Q6H PRN Thornton Park, MD      . morphine 2 MG/ML injection 0.5-1 mg  0.5-1 mg Intravenous Q2H PRN Thornton Park, MD      . multivitamin-lutein (OCUVITE-LUTEIN) capsule 1 capsule  1 capsule Oral BID Thornton Park, MD   1 capsule at 03/30/20 0856  . ondansetron (ZOFRAN) tablet 4 mg  4 mg Oral Q6H PRN Thornton Park, MD       Or  . ondansetron [facility]) injection 4 mg  4 mg Intravenous Q6H PRN Thornton Park, MD      . pantoprazole (PROTONIX) EC tablet 40 mg  40 mg Oral Daily Thornton Park, MD   40 mg at 03/30/20 0856  . polyethylene glycol (MIRALAX / GLYCOLAX) packet 17 g  17 g Oral Daily PRN Thornton Park, MD      . senna (SENOKOT) tablet 8.6 mg  1 tablet Oral BID Thornton Park, MD   8.6 mg at 03/30/20 0857  . traMADol (ULTRAM) tablet 50 mg  50 mg Oral Q6H PRN Ezekiel Slocumb, DO         Discharge Medications: Please see discharge summary for a list of discharge medications.  Relevant Imaging Results:  Relevant Lab Results:   Additional Information SS# [id number]  Shelbie Ammons, RN

## 2020-03-30 NOTE — Plan of Care (Signed)

## 2020-03-30 NOTE — TOC Progression Note (Signed)
Transition of Care [facility]) - Progression Note    Patient Details  Name: [name] [name] MRN: [id number] Date of Birth: [birth year]  Transition of Care [facility] CM/SW Contact  Shelbie Ammons, RN Phone Number: 03/30/2020, 2:04 PM  Clinical Narrative:   RNCM met with patient and daughter in room to present beds. Bed offers for [facility], [facility] were offered and patient and daughter chose bed at [facility].  RNCM accepted bed in the hub, notified Magda Paganini with [facility] and started insurance authorization through [facility].     Expected Discharge Plan: [place] Barriers to Discharge: No Barriers Identified  Expected Discharge Plan and Services Expected Discharge Plan: [place] Choice: [place] arrangements for the past 2 months: Single Family Home                                       Social Determinants of Health (SDOH) Interventions    Readmission Risk Interventions No flowsheet data found.

## 2020-03-31 LAB — CBC
HCT: 32.4 % — ABNORMAL LOW (ref 36.0–46.0)
Hemoglobin: 10.5 g/dL — ABNORMAL LOW (ref 12.0–15.0)
MCH: 29.5 pg (ref 26.0–34.0)
MCHC: 32.4 g/dL (ref 30.0–36.0)
MCV: 91 fL (ref 80.0–100.0)
Platelets: 135 10*3/uL — ABNORMAL LOW (ref 150–400)
RBC: 3.56 MIL/uL — ABNORMAL LOW (ref 3.87–5.11)
RDW: 14.6 % (ref 11.5–15.5)
WBC: 8 10*3/uL (ref 4.0–10.5)
nRBC: 0 % (ref 0.0–0.2)

## 2020-03-31 MED ORDER — NAPHAZOLINE-PHENIRAMINE 0.025-0.3 % OP SOLN: 2.0000 [drp] | Freq: Four times a day (QID) | OPHTHALMIC | Status: DC | PRN

## 2020-03-31 MED ORDER — NAPHAZOLINE-PHENIRAMINE 0.025-0.3 % OP SOLN
2.0000 [drp] | Freq: Four times a day (QID) | OPHTHALMIC | Status: DC | PRN
  Filled 2020-03-31 (×2): qty 5

## 2020-03-31 MED ORDER — ACETAMINOPHEN 325 MG PO TABS
650.0000 mg | ORAL_TABLET | Freq: Four times a day (QID) | ORAL | Status: DC
  Administered 2020-03-31 – 2020-04-02 (×9): 650 mg via ORAL
  Filled 2020-03-31 (×9): qty 2

## 2020-03-31 NOTE — Progress Notes (Signed)
Physical Therapy Treatment Patient Details Name: [name] [name] MRN: [id number] DOB: [birth year] Today's Date: 03/31/2020    History of Present Illness Pt is 87 y.o. female with medical history significant for hypertension with complications of stage III chronic kidney disease, chronic bilateral lower extremity swelling and dyslipidemia who presented to the ED via EMS on 03/27/2020 after a mechanical fall at home and persistent right hip pain with inability to bear weight on right lower extremity.  R Hip pinning performed on 03/28/20.    PT Comments    Pt agreeable to PT tx, reporting she'd like to lay down upon PT asking. Pt requires max assist to complete stand pivot transfer recliner>bed with RW. Pt demonstrates poor ability to maintain PWB (10%) RLE during transfer & has 1 instance of R knee buckling. Pt requires total assist +2 to complete bed mobility with daughter assisting PT. Pt noted to be incontinent of urine during mobility but daughter reports this is preexisting. Continue to recommend STR upon d/c to maximize independence with mobility, reduce fall risk & reduce caregiver burden.     Follow Up Recommendations  SNF;Supervision/Assistance - 24 hour     Equipment Recommendations  None recommended by PT (TBD in next venue)    Recommendations for Other Services       Precautions / Restrictions Precautions Precautions: Fall Restrictions Weight Bearing Restrictions: Yes RLE Weight Bearing: Partial weight bearing RLE Partial Weight Bearing Percentage or Pounds: 10%    Mobility  Bed Mobility Overal bed mobility: Needs Assistance Bed Mobility: Sit to Supine       Sit to supine: Total assist;+2 for safety/equipment   General bed mobility comments: Pt requires total assist to elevate BLE onto bed & lower trunk, total assist to scoot to [facility].    Transfers Overall transfer level: Needs assistance Equipment used: Rolling walker (2 wheeled) Transfers: Sit to/from [employer] Sit to Stand: Mod assist (multimodal cuing to push to standing with 1UE on armrest of chair) Stand pivot transfers: Max assist;+2 safety/equipment       General transfer comment: Pt with great difficulty advancing feet to complete stand pivot, behaviors demonstrating pain with transfer. Pt with poor ability to maintain PWB RLE, 1 instance of R knee buckling.  Ambulation/Gait                 Stairs             Wheelchair Mobility    Modified Rankin (Stroke Patients Only)       Balance Overall balance assessment: Needs assistance         Standing balance support: Bilateral upper extremity supported;During functional activity Standing balance-Leahy Scale: Poor Standing balance comment: max assist & BUE on RW                            Cognition Arousal/Alertness: Awake/alert Behavior During Therapy: WFL for tasks assessed/performed Overall Cognitive Status: Within Functional Limits for tasks assessed                                 General Comments: Pt is HOH but A and oriented x 3. very pleasant and cooperative throughout. supportive daughter present and extremely helpful. assisted as needed for +2 assist      Exercises      General Comments General comments (skin integrity, edema, etc.): incontinent of urine during stand pivot  transfer      Pertinent Vitals/Pain Pain Assessment: Faces Faces Pain Scale: Hurts a little bit Pain Location: R Hip with mobility Pain Descriptors / Indicators: Discomfort;Grimacing Pain Intervention(s): Monitored during session;Repositioned;Limited activity within patient's tolerance    Home Living                      Prior Function            PT Goals (current goals can now be found in the care plan section) Acute Rehab PT Goals Patient Stated Goal: none stated PT Goal Formulation: With patient/family Time For Goal Achievement: 04/12/20 Potential to Achieve Goals:  Good Progress towards PT goals: Progressing toward goals    Frequency    BID      PT Plan Current plan remains appropriate    Co-evaluation              AM-PAC PT "6 Clicks" Mobility   Outcome Measure  Help needed turning from your back to your side while in a flat bed without using bedrails?: A Lot Help needed moving from lying on your back to sitting on the side of a flat bed without using bedrails?: A Lot Help needed moving to and from a bed to a chair (including a wheelchair)?: A Lot Help needed standing up from a chair using your arms (e.g., wheelchair or bedside chair)?: A Lot Help needed to walk in hospital room?: Total Help needed climbing 3-5 steps with a railing? : Total 6 Click Score: 10    End of Session Equipment Utilized During Treatment: Gait belt Activity Tolerance: Patient tolerated treatment well;Patient limited by fatigue Patient left: in bed;with bed alarm set;with family/visitor present;with SCD's reapplied   PT Visit Diagnosis: Unsteadiness on feet (R26.81);Other abnormalities of gait and mobility (R26.89);Repeated falls (R29.6);Muscle weakness (generalized) (M62.81);History of falling (Z91.81);Difficulty in walking, not elsewhere classified (R26.2);Pain Pain - Right/Left: Right Pain - part of body: Leg     Time: [id number] PT Time Calculation (min) (ACUTE ONLY): 17 min  Charges:  $Therapeutic Activity: 8-22 mins                     Lavone Nian, PT, DPT 03/31/20, 3:10 PM    [name] 03/31/2020, 3:09 PM

## 2020-03-31 NOTE — Progress Notes (Signed)
Physical Therapy Treatment Patient Details Name: [name] [name] MRN: [id number] DOB: [birth year] Today's Date: 03/31/2020    History of Present Illness Pt is 87 y.o. female with medical history significant for hypertension with complications of stage III chronic kidney disease, chronic bilateral lower extremity swelling and dyslipidemia who presented to the ED via EMS on 03/27/2020 after a mechanical fall at home and persistent right hip pain with inability to bear weight on right lower extremity.  R Hip pinning performed on 03/28/20.    PT Comments    Pt was alert and cooperative. Is HOH but very pleasant. Super supportive daughter present throughout and assisted as +2 assist for safety. Pt was able to perform there ex handout in bed prior to getting OOB to recliner. Max assist to progress from supine to short sit. Then mod assist to stand from elevated bed height to RW however pt does require max assist to limit wt/off load RLE while pivoting on LLE. Pt has incontinence episode during stand pivot transfer. Assisted with clean up/hygiene care. Overall pt tolerated session well and is progressing. Limited by wt bearing restrictions. Acute PT will continue to follow and progress per POC.     Follow Up Recommendations  SNF;Supervision/Assistance - 24 hour     Equipment Recommendations  Other (comment)       Precautions / Restrictions Precautions Precautions: Fall Restrictions Weight Bearing Restrictions: Yes RLE Weight Bearing: Partial weight bearing    Mobility  Bed Mobility Overal bed mobility: Needs Assistance Bed Mobility: Supine to Sit     Supine to sit: Max assist;+2 for safety/equipment     General bed mobility comments: Pt was able to achieve EOB short sit with max assist of one. 2nd person close for safety. sat EOB x several minutes prior to stand pivot to recliner.    Transfers Overall transfer level: Needs assistance Equipment used: Rolling walker (2  wheeled) Transfers: Stand Pivot Transfers Sit to Stand: Mod assist;From elevated surface;+2 safety/equipment         General transfer comment: Pt was able to stand from elevated bed height with less assist today however continues to struggle with maintaining proper wt bearing restrictions.  Ambulation/Gait  General Gait Details: unsafe due to inability to maintain proper PWB       Balance Overall balance assessment: Needs assistance Sitting-balance support: Feet supported Sitting balance-Leahy Scale: Fair     Standing balance support: Bilateral upper extremity supported;During functional activity Standing balance-Leahy Scale: Poor Standing balance comment: max assist         Cognition Arousal/Alertness: Awake/alert Behavior During Therapy: WFL for tasks assessed/performed Overall Cognitive Status: Within Functional Limits for tasks assessed      General Comments: Pt is HOH but A and oriented x 3. very pleasant and cooperative throughout. supportive daughter present and extremely helpful. assisted as needed for +2 assist      Exercises General Exercises - Lower Extremity Ankle Circles/Pumps: AROM;Strengthening;Both;10 reps Quad Sets: AROM;10 reps;Supine Gluteal Sets: AROM;5 reps (pt has difficulty understanding) Heel Slides: AROM;10 reps Hip ABduction/ADduction: AAROM;5 reps Straight Leg Raises: AAROM;5 reps        Pertinent Vitals/Pain Pain Assessment: Faces Faces Pain Scale: Hurts a little bit Pain Location: R Hip Pain Descriptors / Indicators: Discomfort;Grimacing Pain Intervention(s): Limited activity within patient's tolerance;Premedicated before session;Monitored during session;Repositioned           PT Goals (current goals can now be found in the care plan section) Acute Rehab PT Goals Patient Stated Goal: none stated  Progress towards PT goals: Progressing toward goals    Frequency    BID      PT Plan Current plan remains appropriate     Co-evaluation     PT goals addressed during session: Mobility/safety with mobility;Balance;Proper use of DME        AM-PAC PT "6 Clicks" Mobility   Outcome Measure  Help needed turning from your back to your side while in a flat bed without using bedrails?: A Lot Help needed moving from lying on your back to sitting on the side of a flat bed without using bedrails?: A Lot Help needed moving to and from a bed to a chair (including a wheelchair)?: A Lot Help needed standing up from a chair using your arms (e.g., wheelchair or bedside chair)?: A Lot Help needed to walk in hospital room?: Total Help needed climbing 3-5 steps with a railing? : Total 6 Click Score: 10    End of Session Equipment Utilized During Treatment: Gait belt Activity Tolerance: Patient tolerated treatment well;Patient limited by fatigue Patient left: in bed;with call bell/phone within reach;with bed alarm set;with nursing/sitter in room;with family/visitor present Nurse Communication: Mobility status PT Visit Diagnosis: Unsteadiness on feet (R26.81);Other abnormalities of gait and mobility (R26.89);Repeated falls (R29.6);Muscle weakness (generalized) (M62.81);History of falling (Z91.81);Difficulty in walking, not elsewhere classified (R26.2);Pain Pain - Right/Left: Right Pain - part of body: Leg     Time: [id number] PT Time Calculation (min) (ACUTE ONLY): 25 min  Charges:  $Therapeutic Exercise: 8-22 mins $Therapeutic Activity: 8-22 mins                     Julaine Fusi PTA 03/31/20, 12:57 PM

## 2020-03-31 NOTE — Progress Notes (Signed)
PROGRESS NOTE    [name] [name]   [id number]  DOB: Oct 24, 1926  PCP: Birdie Sons, MD    DOA: 03/27/2020 LOS: 4   Brief Narrative   87 y.o. female with medical history significant for hypertension with complications of stage III chronic kidney disease, chronic bilateral lower extremity swelling and dyslipidemia who presented to the ED via EMS on 03/27/2020 after a mechanical fall at home and persistent right hip pain with inability to bear weight on right lower extremity.    Right femur x-ray in the ED showed an acute mildly angulated and impacted subcapital right femoral neck fracture.  Admitted to hospitalist service with orthopedics consulted. Underwent surgical repair of the right femoral neck fracture on 03/28/2020 by percutaneous fixation.      Assessment & Plan   Principal Problem:   Closed displaced fracture of right femoral neck (HCC) Active Problems:   Hypertension   Hypothyroid   CKD (chronic kidney disease) stage 4, GFR 15-29 ml/min (HCC)   Cellulitis   Closed is displaced right femoral neck fracture -present on admission due to mechanical fall. Orthopedics is consulted in taking patient to surgery today. --Pain control as needed --Bowel regimen --PT evaluation after surgery, anticipate need for rehab --Continue muscle relaxant  Right lower extremity cellulitis -present on admission with erythematous distal right lower extremity.  Admitting physician also noted differential warmth in that area.  I do not appreciate this today however. Patient was afebrile but did have some mild leukocytosis. --On Ancef -continue.  If clinically improving likely discharge on Keflex.   --Monitor clinically and daily CBC  Essential hypertension -takes HCTZ, irbesartan and amlodipine, continued.  CKD stage IV -due to hypertension.  Renal function near baseline.  Monitor BMP  Hypothyroidism -continue Synthroid  GERD -continue Protonix  History of gout -not acutely  flared.  Continue allopurinol    DVT prophylaxis: SCDs Start: 03/29/20 1053 enoxaparin (LOVENOX) injection 30 mg Start: 03/29/20 1000   Diet:  Diet Orders (From admission, onward)    Start     Ordered   03/29/20 1053  Diet regular Room service appropriate? Yes; Fluid consistency: Thin  Diet effective now       Question Answer Comment  Room service appropriate? Yes   Fluid consistency: Thin      03/29/20 1052            Code Status: DNR    Subjective 03/31/20    Patient seen sitting up in bed, daughter at bedside.  Pt only has hip pain with movement, none at rest.  Feels well overall.  No acute complaints.    Disposition Plan & Communication   Status is: Inpatient  Remains inpatient appropriate because:Inpatient level of care appropriate due to severity of illness. On IV antibiotics for cellulitis.  Awaiting insurance authorization for SNF.   Dispo: The patient is from: Home              Anticipated d/c is to: Home with [facility] versus SNF              Anticipated d/c date is: 1 day              Patient currently is not medically stable to d/c.   Difficult to place patient Yes   Family Communication: Daughter at bedside on rounds today   Consults, Procedures, Significant Events   Consultants:   Orthopedics  Procedures:   None  Antimicrobials:  Anti-infectives (From admission, onward)   Start  Dose/Rate Route Frequency Ordered Stop   03/27/20 1922  ceFAZolin (ANCEF) IVPB 2g/100 mL premix        2 g 200 mL/hr over 30 Minutes Intravenous 30 min pre-op 03/27/20 1923 03/28/20 1423   03/27/20 1600  ceFAZolin (ANCEF) IVPB 1 g/50 mL premix        1 g 100 mL/hr over 30 Minutes Intravenous Every 12 hours 03/27/20 1500          Objective   Vitals:   03/31/20 0356 03/31/20 0735 03/31/20 1123 03/31/20 1516  BP: 139/75 131/64 135/73 (!) 103/58  Pulse: 71 67 84 73  Resp: '14 16 17 16  '$ Temp: 98.8 F (37.1 C) 98.6 F (37 C) 97.9 F (36.6 C) (!) 97.4 F (36.3  C)  TempSrc: Oral Oral  Oral  SpO2: 94% 94% 94% 93%  Weight:      Height:        Intake/Output Summary (Last 24 hours) at 03/31/2020 1953 Last data filed at 03/31/2020 1120 Gross per 24 hour  Intake -  Output 500 ml  Net -500 ml   Filed Weights   03/27/20 1215  Weight: 62.6 kg    Physical Exam:  General exam: awake, alert, no acute distress Respiratory system: CTAB, on room air, normal respiratory effort. Cardiovascular system: RRR, normal S1-S2. Gastrointestinal system: soft, NT, non-distended Central nervous system: no gross focal neurologic deficits, normal speech Skin: dry, intact, erythema of distal RLE is greatly improved and receding Extremities moves all, no LE edema    Labs   Data Reviewed: I have personally reviewed following labs and imaging studies  CBC: Recent Labs  Lab 03/27/20 1057 03/28/20 0556 03/29/20 0606 03/30/20 0558 03/31/20 0446  WBC 11.9* 7.7 8.4 7.9 8.0  NEUTROABS 9.4*  --   --   --   --   HGB 12.0 10.9* 10.2* 10.5* 10.5*  HCT 37.2 33.5* 31.7* 32.6* 32.4*  MCV 90.3 89.8 90.3 90.3 91.0  PLT 137* 137* 125* 133* [id number]*   Basic Metabolic Panel: Recent Labs  Lab 03/27/20 1057 03/28/20 0556 03/29/20 0606 03/30/20 0558  NA 140 138 140 140  K 3.7 3.7 3.4* 4.0  CL 107 103 102 104  CO2 21* '26 26 25  '$ GLUCOSE 99 85 79 83  BUN 47* 34* 32* 35*  CREATININE 1.36* 1.16* 1.10* 1.09*  CALCIUM 9.8 9.2 9.4 9.6  MG  --   --   --  1.7   GFR: Estimated Creatinine Clearance: 26.3 mL/min (A) (by C-G formula based on SCr of 1.09 mg/dL (H)). Liver Function Tests: No results for input(s): AST, ALT, ALKPHOS, BILITOT, PROT, ALBUMIN in the last 168 hours. No results for input(s): LIPASE, AMYLASE in the last 168 hours. No results for input(s): AMMONIA in the last 168 hours. Coagulation Profile: Recent Labs  Lab 03/27/20 1346  INR 1.0   Cardiac Enzymes: No results for input(s): CKTOTAL, CKMB, CKMBINDEX, TROPONINI in the last 168 hours. BNP (last 3  results) No results for input(s): PROBNP in the last 8760 hours. HbA1C: No results for input(s): HGBA1C in the last 72 hours. CBG: Recent Labs  Lab 03/27/20 2114  GLUCAP 116*   Lipid Profile: No results for input(s): CHOL, HDL, LDLCALC, TRIG, CHOLHDL, LDLDIRECT in the last 72 hours. Thyroid Function Tests: No results for input(s): TSH, T4TOTAL, FREET4, T3FREE, THYROIDAB in the last 72 hours. Anemia Panel: No results for input(s): VITAMINB12, FOLATE, FERRITIN, TIBC, IRON, RETICCTPCT in the last 72 hours. Sepsis Labs: No results for  input(s): PROCALCITON, LATICACIDVEN in the last 168 hours.  Recent Results (from the past 240 hour(s))  Resp Panel by RT-PCR (Flu A&B, Covid) Nasopharyngeal Swab     Status: None   Collection Time: 03/27/20 10:57 AM   Specimen: Nasopharyngeal Swab; Nasopharyngeal(NP) swabs in vial transport medium  Result Value Ref Range Status   SARS Coronavirus 2 by RT PCR NEGATIVE NEGATIVE Final    Comment: (NOTE) SARS-CoV-2 target nucleic acids are NOT DETECTED.  The SARS-CoV-2 RNA is generally detectable in upper respiratory specimens during the acute phase of infection. The lowest concentration of SARS-CoV-2 viral copies this assay can detect is 138 copies/mL. A negative result does not preclude SARS-Cov-2 infection and should not be used as the sole basis for treatment or other patient management decisions. A negative result may occur with  improper specimen collection/handling, submission of specimen other than nasopharyngeal swab, presence of viral mutation(s) within the areas targeted by this assay, and inadequate number of viral copies(<138 copies/mL). A negative result must be combined with clinical observations, patient history, and epidemiological information. The expected result is Negative.  Fact Sheet for Patients:  EntrepreneurPulse.com.au  Fact Sheet for Healthcare Providers:  IncredibleEmployment.be  This  test is no t yet approved or cleared by the [place] FDA and  has been authorized for detection and/or diagnosis of SARS-CoV-2 by FDA under an Emergency Use Authorization (EUA). This EUA will remain  in effect (meaning this test can be used) for the duration of the COVID-19 declaration under Section 564(b)(1) of the Act, 21 U.S.C.section 360bbb-3(b)(1), unless the authorization is terminated  or revoked sooner.       Influenza A by PCR NEGATIVE NEGATIVE Final   Influenza B by PCR NEGATIVE NEGATIVE Final    Comment: (NOTE) The Xpert Xpress SARS-CoV-2/FLU/RSV plus assay is intended as an aid in the diagnosis of influenza from Nasopharyngeal swab specimens and should not be used as a sole basis for treatment. Nasal washings and aspirates are unacceptable for Xpert Xpress SARS-CoV-2/FLU/RSV testing.  Fact Sheet for Patients: EntrepreneurPulse.com.au  Fact Sheet for Healthcare Providers: IncredibleEmployment.be  This test is not yet approved or cleared by the [place] FDA and has been authorized for detection and/or diagnosis of SARS-CoV-2 by FDA under an Emergency Use Authorization (EUA). This EUA will remain in effect (meaning this test can be used) for the duration of the COVID-19 declaration under Section 564(b)(1) of the Act, 21 U.S.C. section 360bbb-3(b)(1), unless the authorization is terminated or revoked.  Performed at [facility], [address] [place], [place] [address]   Culture, blood (routine x 2)     Status: None (Preliminary result)   Collection Time: 03/27/20  3:27 PM   Specimen: BLOOD  Result Value Ref Range Status   Specimen Description BLOOD BLOOD RIGHT FOREARM  Final   Special Requests   Final    BOTTLES DRAWN AEROBIC AND ANAEROBIC Blood Culture adequate volume   Culture   Final    NO GROWTH 4 DAYS Performed at [facility], [address] [place], [place] [address]    Report Status  PENDING  Incomplete  Culture, blood (routine x 2)     Status: None (Preliminary result)   Collection Time: 03/27/20  3:37 PM   Specimen: BLOOD  Result Value Ref Range Status   Specimen Description BLOOD LEFT ANTECUBITAL  Final   Special Requests   Final    BOTTLES DRAWN AEROBIC AND ANAEROBIC Blood Culture adequate volume   Culture  Final    NO GROWTH 4 DAYS Performed at [facility], [address] [place], [place] [address]    Report Status PENDING  Incomplete      Imaging Studies   No results found.   Medications   Scheduled Meds: . acetaminophen  650 mg Oral Q6H  . allopurinol  200 mg Oral Daily  . amLODipine  5 mg Oral Daily  . calcium-vitamin D  1 tablet Oral BID  . Chlorhexidine Gluconate Cloth  6 each Topical Daily  . docusate sodium  100 mg Oral BID  . enoxaparin (LOVENOX) injection  30 mg Subcutaneous Q24H  . feeding supplement  237 mL Oral BID BM  . irbesartan  300 mg Oral Daily   And  . hydrochlorothiazide  25 mg Oral Daily  . levothyroxine  88 mcg Oral Q0600  . multivitamin-lutein  1 capsule Oral BID  . pantoprazole  40 mg Oral Daily  . senna  1 tablet Oral BID   Continuous Infusions: . sodium chloride 500 mL (03/29/20 0423)  .  ceFAZolin (ANCEF) IV 1 g (03/31/20 1618)  . methocarbamol (ROBAXIN) IV         LOS: 4 days    Time spent: 20 minutes    Ezekiel Slocumb, DO [facility]  03/31/2020, 7:53 PM      If 7PM-7AM, please contact night-coverage. How to contact the [facility] Attending or Consulting provider [place] or covering provider during after hours [place], for this patient?    1. Check the care team in [facility] and look for a) attending/consulting [facility] provider listed and b) the [facility] team listed 2. Log into [email or web address] and use [place]'s universal password to access. If you do not have the password, please contact the hospital operator. 3. Locate the [facility] provider you are looking for under [facility] and page to a  number that you can be directly reached. 4. If you still have difficulty reaching the provider, please page the [facility] (Director on Call) for the Hospitalists listed on amion for assistance.

## 2020-04-01 ENCOUNTER — Inpatient Hospital Stay: Payer: PPO

## 2020-04-01 ENCOUNTER — Encounter: Payer: Self-pay | Admitting: Internal Medicine

## 2020-04-01 DIAGNOSIS — K432 Incisional hernia without obstruction or gangrene: Secondary | ICD-10-CM

## 2020-04-01 LAB — CULTURE, BLOOD (ROUTINE X 2)
Culture: NO GROWTH
Culture: NO GROWTH
Special Requests: ADEQUATE
Special Requests: ADEQUATE

## 2020-04-01 MED ORDER — IOHEXOL 9 MG/ML PO SOLN
500.0000 mL | ORAL | Status: AC
  Administered 2020-04-01 (×2): 500 mL via ORAL

## 2020-04-01 MED ORDER — IOHEXOL 300 MG/ML  SOLN
100.0000 mL | Freq: Once | INTRAMUSCULAR | Status: AC | PRN
  Administered 2020-04-01: 100 mL via INTRAVENOUS

## 2020-04-01 NOTE — Progress Notes (Signed)
PROGRESS NOTE    [name] [name]   [id number]  DOB: 1926-06-06  PCP: Birdie Sons, MD    DOA: 03/27/2020 LOS: 5   Brief Narrative   87 y.o. female with medical history significant for hypertension with complications of stage III chronic kidney disease, chronic bilateral lower extremity swelling and dyslipidemia who presented to the ED via EMS on 03/27/2020 after a mechanical fall at home and persistent right hip pain with inability to bear weight on right lower extremity.    Right femur x-ray in the ED showed an acute mildly angulated and impacted subcapital right femoral neck fracture.  Admitted to hospitalist service with orthopedics consulted. Underwent surgical repair of the right femoral neck fracture on 03/28/2020 by percutaneous fixation.      Assessment & Plan   Principal Problem:   Closed displaced fracture of right femoral neck (HCC) Active Problems:   Hypertension   Hypothyroid   CKD (chronic kidney disease) stage 4, GFR 15-29 ml/min (HCC)   Cellulitis   Closed is displaced right femoral neck fracture -present on admission due to mechanical fall. Orthopedics is consulted.   Underwent screw fixation on 2/9. Patient has only had pain with movement, not at rest. --Pain control as needed --Bowel regimen --PT recommends rehab, pending placement --Continue muscle relaxant  Large ventral hernia - noted by nursing to have quite distended protuberance of her abdomen.  Has large ventral hernia.  General surgery consulted, getting imaging.  Right lower extremity cellulitis -present on admission with erythematous distal right lower extremity.  Admitting physician also noted differential warmth in that area.  I do not appreciate this today however. Patient was afebrile but did have some mild leukocytosis. --On Ancef -continue.  If clinically improving likely discharge on Keflex.   --Monitor clinically and daily CBC  Essential hypertension -takes HCTZ, irbesartan and  amlodipine, continued.  CKD stage IV -due to hypertension.  Renal function near baseline.  Monitor BMP  Hypothyroidism -continue Synthroid  GERD -continue Protonix  History of gout -not acutely flared.  Continue allopurinol    DVT prophylaxis: SCDs Start: 03/29/20 1053 enoxaparin (LOVENOX) injection 30 mg Start: 03/29/20 1000   Diet:  Diet Orders (From admission, onward)    Start     Ordered   03/29/20 1053  Diet regular Room service appropriate? Yes; Fluid consistency: Thin  Diet effective now       Question Answer Comment  Room service appropriate? Yes   Fluid consistency: Thin      03/29/20 1052            Code Status: DNR    Subjective 04/01/20    Patient on bedside commode, had a BM.  Daughter at bedside.  No abdominal pain, N/V.  Pt can't provide history about her hernia or how large it previously was.  Ventral hernia protrudes with bearing down, causes no pain.   Disposition Plan & Communication   Status is: Inpatient  Remains inpatient appropriate because:Inpatient level of care appropriate due to severity of illness. On IV antibiotics for cellulitis.  Awaiting insurance authorization for SNF.     Dispo: The patient is from: Home              Anticipated d/c is to: Home with [facility] versus SNF              Anticipated d/c date is: 1 day              Patient currently is not medically stable  to d/c.   Difficult to place patient Yes   Family Communication: Daughter at bedside on rounds 2/11.  Will attempt to call this afternoon as time allows.   Consults, Procedures, Significant Events   Consultants:   Orthopedics  General Surgery  Procedures:   None  Antimicrobials:  Anti-infectives (From admission, onward)   Start     Dose/Rate Route Frequency Ordered Stop   03/27/20 1922  ceFAZolin (ANCEF) IVPB 2g/100 mL premix        2 g 200 mL/hr over 30 Minutes Intravenous 30 min pre-op 03/27/20 1923 03/28/20 1423   03/27/20 1600  ceFAZolin (ANCEF) IVPB 1  g/50 mL premix  Status:  Discontinued        1 g 100 mL/hr over 30 Minutes Intravenous Every 12 hours 03/27/20 1500 04/01/20 0830        Objective   Vitals:   04/01/20 0039 04/01/20 0438 04/01/20 0806 04/01/20 1545  BP: (!) 140/58 (!) 157/86 128/67 112/66  Pulse: 66 70 70 64  Resp: '16 16 16 15  '$ Temp: 97.9 F (36.6 C) 97.8 F (36.6 C) 98.1 F (36.7 C) 98.6 F (37 C)  TempSrc: Oral Oral    SpO2: 94% 96% 95% 95%  Weight:      Height:        Intake/Output Summary (Last 24 hours) at 04/01/2020 2038 Last data filed at 04/01/2020 1854 Gross per 24 hour  Intake 120 ml  Output 1500 ml  Net -1380 ml   Filed Weights   03/27/20 1215  Weight: 62.6 kg    Physical Exam:  General exam: awake, alert, no acute distress Respiratory system: CTAB, on room air, normal respiratory effort.  No wheezes or rhonchi.  Cardiovascular system: RRR, normal S1-S2. Gastrointestinal system: large ventral hernia protrudes when pt bears down, otherwise soft, NT, non-distended, having BM on bedside commode Central nervous system: CN's grossly intact, normal speech Skin: dry, intact, erythema of distal RLE continues resolving Extremities moves all, no LE edema    Labs   Data Reviewed: I have personally reviewed following labs and imaging studies  CBC: Recent Labs  Lab 03/27/20 1057 03/28/20 0556 03/29/20 0606 03/30/20 0558 03/31/20 0446  WBC 11.9* 7.7 8.4 7.9 8.0  NEUTROABS 9.4*  --   --   --   --   HGB 12.0 10.9* 10.2* 10.5* 10.5*  HCT 37.2 33.5* 31.7* 32.6* 32.4*  MCV 90.3 89.8 90.3 90.3 91.0  PLT 137* 137* 125* 133* [id number]*   Basic Metabolic Panel: Recent Labs  Lab 03/27/20 1057 03/28/20 0556 03/29/20 0606 03/30/20 0558  NA 140 138 140 140  K 3.7 3.7 3.4* 4.0  CL 107 103 102 104  CO2 21* '26 26 25  '$ GLUCOSE 99 85 79 83  BUN 47* 34* 32* 35*  CREATININE 1.36* 1.16* 1.10* 1.09*  CALCIUM 9.8 9.2 9.4 9.6  MG  --   --   --  1.7   GFR: Estimated Creatinine Clearance: 26.3 mL/min  (A) (by C-G formula based on SCr of 1.09 mg/dL (H)). Liver Function Tests: No results for input(s): AST, ALT, ALKPHOS, BILITOT, PROT, ALBUMIN in the last 168 hours. No results for input(s): LIPASE, AMYLASE in the last 168 hours. No results for input(s): AMMONIA in the last 168 hours. Coagulation Profile: Recent Labs  Lab 03/27/20 1346  INR 1.0   Cardiac Enzymes: No results for input(s): CKTOTAL, CKMB, CKMBINDEX, TROPONINI in the last 168 hours. BNP (last 3 results) No results for input(s): PROBNP in  the last 8760 hours. HbA1C: No results for input(s): HGBA1C in the last 72 hours. CBG: Recent Labs  Lab 03/27/20 2114  GLUCAP 116*   Lipid Profile: No results for input(s): CHOL, HDL, LDLCALC, TRIG, CHOLHDL, LDLDIRECT in the last 72 hours. Thyroid Function Tests: No results for input(s): TSH, T4TOTAL, FREET4, T3FREE, THYROIDAB in the last 72 hours. Anemia Panel: No results for input(s): VITAMINB12, FOLATE, FERRITIN, TIBC, IRON, RETICCTPCT in the last 72 hours. Sepsis Labs: No results for input(s): PROCALCITON, LATICACIDVEN in the last 168 hours.  Recent Results (from the past 240 hour(s))  Resp Panel by RT-PCR (Flu A&B, Covid) Nasopharyngeal Swab     Status: None   Collection Time: 03/27/20 10:57 AM   Specimen: Nasopharyngeal Swab; Nasopharyngeal(NP) swabs in vial transport medium  Result Value Ref Range Status   SARS Coronavirus 2 by RT PCR NEGATIVE NEGATIVE Final    Comment: (NOTE) SARS-CoV-2 target nucleic acids are NOT DETECTED.  The SARS-CoV-2 RNA is generally detectable in upper respiratory specimens during the acute phase of infection. The lowest concentration of SARS-CoV-2 viral copies this assay can detect is 138 copies/mL. A negative result does not preclude SARS-Cov-2 infection and should not be used as the sole basis for treatment or other patient management decisions. A negative result may occur with  improper specimen collection/handling, submission of specimen  other than nasopharyngeal swab, presence of viral mutation(s) within the areas targeted by this assay, and inadequate number of viral copies(<138 copies/mL). A negative result must be combined with clinical observations, patient history, and epidemiological information. The expected result is Negative.  Fact Sheet for Patients:  EntrepreneurPulse.com.au  Fact Sheet for Healthcare Providers:  IncredibleEmployment.be  This test is no t yet approved or cleared by the [place] FDA and  has been authorized for detection and/or diagnosis of SARS-CoV-2 by FDA under an Emergency Use Authorization (EUA). This EUA will remain  in effect (meaning this test can be used) for the duration of the COVID-19 declaration under Section 564(b)(1) of the Act, 21 U.S.C.section 360bbb-3(b)(1), unless the authorization is terminated  or revoked sooner.       Influenza A by PCR NEGATIVE NEGATIVE Final   Influenza B by PCR NEGATIVE NEGATIVE Final    Comment: (NOTE) The Xpert Xpress SARS-CoV-2/FLU/RSV plus assay is intended as an aid in the diagnosis of influenza from Nasopharyngeal swab specimens and should not be used as a sole basis for treatment. Nasal washings and aspirates are unacceptable for Xpert Xpress SARS-CoV-2/FLU/RSV testing.  Fact Sheet for Patients: EntrepreneurPulse.com.au  Fact Sheet for Healthcare Providers: IncredibleEmployment.be  This test is not yet approved or cleared by the [place] FDA and has been authorized for detection and/or diagnosis of SARS-CoV-2 by FDA under an Emergency Use Authorization (EUA). This EUA will remain in effect (meaning this test can be used) for the duration of the COVID-19 declaration under Section 564(b)(1) of the Act, 21 U.S.C. section 360bbb-3(b)(1), unless the authorization is terminated or revoked.  Performed at [facility], [address]  [place], [place] [address]   Culture, blood (routine x 2)     Status: None   Collection Time: 03/27/20  3:27 PM   Specimen: BLOOD  Result Value Ref Range Status   Specimen Description BLOOD BLOOD RIGHT FOREARM  Final   Special Requests   Final    BOTTLES DRAWN AEROBIC AND ANAEROBIC Blood Culture adequate volume   Culture   Final    NO GROWTH 5 DAYS Performed at [facility], [address] [place], [place] [address]    Report Status 04/01/2020 FINAL  Final  Culture, blood (routine x 2)     Status: None   Collection Time: 03/27/20  3:37 PM   Specimen: BLOOD  Result Value Ref Range Status   Specimen Description BLOOD LEFT ANTECUBITAL  Final   Special Requests   Final    BOTTLES DRAWN AEROBIC AND ANAEROBIC Blood Culture adequate volume   Culture   Final    NO GROWTH 5 DAYS Performed at [facility], [address] [place], [place] [address]    Report Status 04/01/2020 FINAL  Final      Imaging Studies   CT ABDOMEN PELVIS W CONTRAST  Result Date: 04/01/2020 CLINICAL DATA:  Firm bulge in the abdomen. EXAM: CT ABDOMEN AND PELVIS WITH CONTRAST TECHNIQUE: Multidetector CT imaging of the abdomen and pelvis was performed using the standard protocol following bolus administration of intravenous contrast. CONTRAST:  146m OMNIPAQUE IOHEXOL 300 MG/ML  SOLN COMPARISON:  None. FINDINGS: Lower chest: There is atelectasis versus scarring at the lung bases.The heart is enlarged. Hepatobiliary: The liver is normal. Status post cholecystectomy.There is no biliary ductal dilation. Pancreas: Normal contours without ductal dilatation. No peripancreatic fluid collection. Spleen: Unremarkable. Adrenals/Urinary Tract: --Adrenal glands: There is a benign appearing left adrenal adenoma. --Right kidney/ureter: The right kidney is atrophic. --Left kidney/ureter: There is atrophy of the lower pole left kidney with multiple nonobstructing stones measuring up to approximately 6 mm. --Urinary  bladder: The urinary bladder is distended. Stomach/Bowel: --Stomach/Duodenum: There is a large hiatal hernia. --Small bowel: Unremarkable. --Colon: Unremarkable. --Appendix: Normal. Vascular/Lymphatic: Atherosclerotic calcification is present within the non-aneurysmal abdominal aorta, without hemodynamically significant stenosis. --No retroperitoneal lymphadenopathy. --No mesenteric lymphadenopathy. --No pelvic or inguinal lymphadenopathy. Reproductive: Status post hysterectomy. No adnexal mass. Other: No ascites or free air. There is pelvic floor laxity with evidence for a rectocele. Musculoskeletal. No acute displaced fractures. IMPRESSION: 1. Large hiatal hernia. 2. Pelvic floor laxity with evidence for a rectocele. Aortic Atherosclerosis (ICD10-I70.0). Electronically Signed   By: CConstance HolsterM.D.   On: 04/01/2020 15:12     Medications   Scheduled Meds: . acetaminophen  650 mg Oral Q6H  . allopurinol  200 mg Oral Daily  . amLODipine  5 mg Oral Daily  . calcium-vitamin D  1 tablet Oral BID  . Chlorhexidine Gluconate Cloth  6 each Topical Daily  . docusate sodium  100 mg Oral BID  . enoxaparin (LOVENOX) injection  30 mg Subcutaneous Q24H  . feeding supplement  237 mL Oral BID BM  . irbesartan  300 mg Oral Daily   And  . hydrochlorothiazide  25 mg Oral Daily  . levothyroxine  88 mcg Oral Q0600  . multivitamin-lutein  1 capsule Oral BID  . pantoprazole  40 mg Oral Daily  . senna  1 tablet Oral BID   Continuous Infusions: . sodium chloride 500 mL (03/29/20 0423)       LOS: 5 days    Time spent: 25 minutes with > 50% spent in coordination of care and at bedside.    KEzekiel Slocumb DO [facility]  04/01/2020, 8:38 PM      If 7PM-7AM, please contact night-coverage. How to contact the [facility] Hospital[facility]ending or Consulting provider [place] covering provider during after hours [place] for this patient?    1. Check the care team in [facility] look for a)  attending/consulting [facility] provider listed and b) the [place]  team listed 2. Log into [email or web address] and use [facility]'s universal password to access. If you do not have the password, please contact the hospital operator. 3. Locate the [facility] provider you are looking for under [facility] and page to a number that you can be directly reached. 4. If you still have difficulty reaching the provider, please page the [facility] (Director on Call) for the Hospitalists listed on amion for assistance.

## 2020-04-01 NOTE — Progress Notes (Signed)
Physical Therapy Treatment Patient Details Name: [name] [name] MRN: [id number] DOB: 1927/02/05 Today's Date: 04/01/2020    History of Present Illness Pt is 87 y.o. female with medical history significant for hypertension with complications of stage III chronic kidney disease, chronic bilateral lower extremity swelling and dyslipidemia who presented to the ED via EMS on 03/27/2020 after a mechanical fall at home and persistent right hip pain with inability to bear weight on right lower extremity.  R Hip pinning performed on 03/28/20.    PT Comments    Ready for session,  Daughter in attendance.  Participated in exercises as described below.  Inc urine/small BM smear and rolling left/right with rail with ease.  To EOB with less assist today and good effort.  Mod a to get fully upright.  Once sitting, min guard for safety.  She is able to stand to RW with mod a x 1 and transfer tor recliner with heavy reliance on RW and poor ability to [employer] TTWB status so gait was not progressed past transfer.  She did stand one other attempt at chairside but sits quickly.  Remained in recliner after session with needs met.  She did have a large firm bulge in her abdomen today which daughter stated she had not noticed it being as pronounced in the past. Discussed with RN.   Follow Up Recommendations  SNF;Supervision/Assistance - 24 hour     Equipment Recommendations  None recommended by PT    Recommendations for Other Services       Precautions / Restrictions Precautions Precautions: Fall Restrictions Weight Bearing Restrictions: Yes RLE Weight Bearing: Touchdown weight bearing RLE Partial Weight Bearing Percentage or Pounds: 10% ttwb    Mobility  Bed Mobility Overal bed mobility: Needs Assistance Bed Mobility: Sit to Supine;Rolling Rolling: Min guard   Supine to sit: Mod assist     General bed mobility comments: rolls with ease today with rails and good effort to get to EOB     Transfers Overall transfer level: Needs assistance Equipment used: Rolling walker (2 wheeled) Transfers: Sit to/from Stand Sit to Stand: Mod assist            Ambulation/Gait Ambulation/Gait assistance: Mod assist Gait Distance (Feet): 2 Feet Assistive device: Rolling walker (2 wheeled) Gait Pattern/deviations: Step-to pattern Gait velocity: decreased   General Gait Details: unable to progress gait past transfer due to inability to maintain WB status   Stairs             Wheelchair Mobility    Modified Rankin (Stroke Patients Only)       Balance Overall balance assessment: Needs assistance Sitting-balance support: Feet supported Sitting balance-Leahy Scale: Fair Sitting balance - Comments: SUPERVISION seated EOB   Standing balance support: Bilateral upper extremity supported;During functional activity Standing balance-Leahy Scale: Poor Standing balance comment: mod assist for attempts to transfer                            Cognition Arousal/Alertness: Awake/alert Behavior During Therapy: WFL for tasks assessed/performed Overall Cognitive Status: Within Functional Limits for tasks assessed                                 General Comments: Pt is HOH but A and oriented x 3. very pleasant and cooperative throughout. supportive daughter present and extremely helpful. assisted as needed for +2 assist  Exercises General Exercises - Lower Extremity Ankle Circles/Pumps: AROM;Strengthening;Both;10 reps Quad Sets: AROM;10 reps;Supine Gluteal Sets: AROM;5 reps (pt has difficulty understanding) Heel Slides: AROM;10 reps Hip ABduction/ADduction: AAROM;5 reps Straight Leg Raises: AAROM;5 reps Other Exercises Other Exercises: rolling left/right for inc urine    General Comments        Pertinent Vitals/Pain Pain Assessment: Faces Faces Pain Scale: Hurts a little bit Pain Location: R Hip with mobility Pain Descriptors /  Indicators: Sore Pain Intervention(s): Limited activity within patient's tolerance;Monitored during session;Repositioned    Home Living                      Prior Function            PT Goals (current goals can now be found in the care plan section) Progress towards PT goals: Progressing toward goals    Frequency    BID      PT Plan Current plan remains appropriate    Co-evaluation              AM-PAC PT "6 Clicks" Mobility   Outcome Measure  Help needed turning from your back to your side while in a flat bed without using bedrails?: A Lot Help needed moving from lying on your back to sitting on the side of a flat bed without using bedrails?: A Lot Help needed moving to and from a bed to a chair (including a wheelchair)?: A Lot Help needed standing up from a chair using your arms (e.g., wheelchair or bedside chair)?: A Lot Help needed to walk in hospital room?: Total Help needed climbing 3-5 steps with a railing? : Total 6 Click Score: 10    End of Session Equipment Utilized During Treatment: Gait belt Activity Tolerance: Patient tolerated treatment well;Patient limited by fatigue Patient left: in chair;with call bell/phone within reach;with chair alarm set Nurse Communication: Mobility status PT Visit Diagnosis: Unsteadiness on feet (R26.81);Other abnormalities of gait and mobility (R26.89);Repeated falls (R29.6);Muscle weakness (generalized) (M62.81);History of falling (Z91.81);Difficulty in walking, not elsewhere classified (R26.2);Pain Pain - part of body: Leg     Time: 3785-8850 PT Time Calculation (min) (ACUTE ONLY): 21 min  Charges:  $Therapeutic Exercise: 8-22 mins                    Chesley Noon, PTA 04/01/20, 11:10 AM

## 2020-04-01 NOTE — Consult Note (Signed)
Patient ID: [name] [name], female   DOB: Nov 02, 1926, 87 y.o.   MRN: [id number]  HPI [name] [name] is a 87 y.o. female seen in consultation at the request of Dr. Arbutus Ped for visual hernia.  Please note that the patient was recently admitted due to a fall and had a right femoral neck fracture.  He underwent fixation of her hip fracture 4 days ago by Dr. Mack Guise and has done well.  Dr. Arbutus Ped noticed some bulging of the abdominal wall and apparently this has increased over time.  The patient does really not have any complaints from the abdominal perspective.  She denies any fevers any chills any nausea or vomiting.  She is taking diet well.  Only prior abdominal operation was a laparoscopic cholecystectomy more than 25 years ago.  She also had an abdominal hysterectomy.  Her daughter is with her at the bedside.  Tensive counseling provided to both of them. I have ordered a CT scan of the abdomen and pelvis, that I have personally reviewed showing evidence of paraesophageal hernia that is large as well as a very small umbilical hernia.  There is the bulging related to severe scoliosis but there is no evidence of any major ventral defects that warrant major interventions at this time. Hemoglobin was 10.5 and the rest of the CBC is normal.  Her BMP is normal with a creatinine of 1.09.  HPI  Past Medical History:  Diagnosis Date  . Hyperlipidemia   . Hypertension   . Myocardial infarction [facility])     Past Surgical History:  Procedure Laterality Date  . ABDOMINAL HYSTERECTOMY  1995   abdominal-uterine prolapse, 1 ovary removed, bladder tact  . CARDIAC CATHETERIZATION    . CHOLECYSTECTOMY    . HIP PINNING,CANNULATED Right 03/28/2020   Procedure: CANNULATED HIP PINNING;  Surgeon: Thornton Park, MD;  Location: [facility] ORS;  Service: Orthopedics;  Laterality: Right;  . TONSILLECTOMY AND ADENOIDECTOMY      Family History  Problem Relation Age of Onset  . Ovarian cancer Mother   . Alcohol abuse  Sister   . Alzheimer's disease Sister     Social History Social History   Tobacco Use  . Smoking status: Former Smoker    Types: Cigarettes    Quit date: 02/18/2000    Years since quitting: 20.1  . Smokeless tobacco: Never Used  Vaping Use  . Vaping Use: Never used  Substance Use Topics  . Alcohol use: No    Alcohol/week: 0.0 standard drinks  . Drug use: No    Allergies  Allergen Reactions  . Atorvastatin Other (See Comments)    myalgias/arthralgias.  . Lovastatin     Other reaction(s): Muscle Pain  . Simvastatin     Elevated CK    Current Facility-Administered Medications  Medication Dose Route Frequency Provider Last Rate Last Admin  . 0.9 %  sodium chloride infusion   Intravenous PRN Thornton Park, MD 10 mL/hr at 03/29/20 0423 500 mL at 03/29/20 0423  . acetaminophen (TYLENOL) tablet 650 mg  650 mg Oral Q6H Griffith, Kelly A, DO   650 mg at 04/01/20 1059  . allopurinol (ZYLOPRIM) tablet 200 mg  200 mg Oral Daily Thornton Park, MD   200 mg at 03/31/20 2017  . alum & mag hydroxide-simeth (MAALOX/MYLANTA) 200-200-20 MG/5ML suspension 30 mL  30 mL Oral Q4H PRN Thornton Park, MD      . amLODipine (NORVASC) tablet 5 mg  5 mg Oral Daily Thornton Park, MD   5  mg at 04/01/20 1059  . bisacodyl (DULCOLAX) suppository 10 mg  10 mg Rectal Daily PRN Thornton Park, MD      . calcium-vitamin D (OSCAL WITH D) 500-200 MG-UNIT per tablet 1 tablet  1 tablet Oral BID Thornton Park, MD   1 tablet at 04/01/20 0820  . Chlorhexidine Gluconate Cloth 2 % PADS 6 each  6 each Topical Daily Nicole Kindred A, DO   6 each at 04/01/20 1103  . docusate sodium (COLACE) capsule 100 mg  100 mg Oral BID Thornton Park, MD   100 mg at 04/01/20 1059  . enoxaparin (LOVENOX) injection 30 mg  30 mg Subcutaneous Q24H Rauer, Samantha O, RPH   30 mg at 04/01/20 1059  . feeding supplement (ENSURE ENLIVE / ENSURE PLUS) liquid 237 mL  237 mL Oral BID BM Nicole Kindred A, DO   237 mL at 04/01/20 1103   . irbesartan (AVAPRO) tablet 300 mg  300 mg Oral Daily Thornton Park, MD   300 mg at 04/01/20 1059   And  . hydrochlorothiazide (HYDRODIURIL) tablet 25 mg  25 mg Oral Daily Thornton Park, MD   25 mg at 04/01/20 1102  . levothyroxine (SYNTHROID) tablet 88 mcg  88 mcg Oral Q0600 Thornton Park, MD   88 mcg at 04/01/20 0559  . magnesium citrate solution 1 Bottle  1 Bottle Oral Once PRN Thornton Park, MD      . methocarbamol (ROBAXIN) tablet 500 mg  500 mg Oral Q6H PRN Thornton Park, MD      . multivitamin-lutein (OCUVITE-LUTEIN) capsule 1 capsule  1 capsule Oral BID Thornton Park, MD   1 capsule at 04/01/20 1100  . naphazoline-pheniramine (NAPHCON-A) 0.025-0.3 % ophthalmic solution 2 drop  2 drop Right Eye QID PRN Ezekiel Slocumb, DO      . ondansetron (ZOFRAN) tablet 4 mg  4 mg Oral Q6H PRN Thornton Park, MD      . pantoprazole (PROTONIX) EC tablet 40 mg  40 mg Oral Daily Thornton Park, MD   40 mg at 04/01/20 1059  . polyethylene glycol (MIRALAX / GLYCOLAX) packet 17 g  17 g Oral Daily PRN Thornton Park, MD      . senna (SENOKOT) tablet 8.6 mg  1 tablet Oral BID Thornton Park, MD   8.6 mg at 04/01/20 1059  . traMADol (ULTRAM) tablet 50 mg  50 mg Oral Q6H PRN Ezekiel Slocumb, DO         Review of Systems Full ROS  was asked and was negative except for the information on the HPI  Physical Exam Blood pressure 128/67, pulse 70, temperature 98.1 F (36.7 C), resp. rate 16, height '4\' 10"'$  (1.473 m), weight 62.6 kg, SpO2 95 %. CONSTITUTIONAL: Elderly female debilitated and fragile no acute distress. EYES: Pupils are equal, round, and reactive to light, Sclera are non-icteric. EARS, NOSE, MOUTH AND THROAT: The oropharynx is clear. The oral mucosa is pink and moist. Hearing is intact to voice. LYMPH NODES:  Lymph nodes in the neck are normal. RESPIRATORY:  Lungs are clear. There is normal respiratory effort, with equal breath sounds bilaterally, and without pathologic  use of accessory muscles. CARDIOVASCULAR: Heart is regular without murmurs, gallops, or rubs. GI: The abdomen is  soft, nontender, and nondistended. There are no palpable masses. There is no hepatosplenomegaly.  There is a large diastasis recti and her abdominal wall is shifted likely due to severe scoliosis and advanced age.  There is evidence of a small periumbilical incisional  hernia that is reducible.  There is no evidence of peritonitis  GU: Rectal deferred.   MUSCULOSKELETAL: Normal muscle strength and tone. No cyanosis or edema.   SKIN: Turgor is good and there are no pathologic skin lesions or ulcers. NEUROLOGIC: Motor and sensation is grossly normal. Cranial nerves are grossly intact. PSYCH:  Oriented to person, place and time. Affect is normal.  Data Reviewed  I have personally reviewed the patient's imaging, laboratory findings and medical records.    Assessment/Plan 87 year old female with small incisional hernia that is currently asymptomatic.  I do think the majority of her abdominal bulging is related to some rectus diastases and severe scoliosis that causes significant protrusion of her anterior abdominal wall.  I am not concerned about the need for surgical intervention.  We can definitely follow her as an outpatient once she recovers from this hospitalization.  She is debilitated and very fragile and will probably not perform any major interventions for her small hernia.  We will be available  Caroleen Hamman, MD [place] Surgeon 04/01/2020, 2:51 PM

## 2020-04-02 DIAGNOSIS — N184 Chronic kidney disease, stage 4 (severe): Secondary | ICD-10-CM | POA: Diagnosis not present

## 2020-04-02 DIAGNOSIS — K5900 Constipation, unspecified: Secondary | ICD-10-CM | POA: Diagnosis not present

## 2020-04-02 DIAGNOSIS — N189 Chronic kidney disease, unspecified: Secondary | ICD-10-CM | POA: Diagnosis not present

## 2020-04-02 DIAGNOSIS — L03115 Cellulitis of right lower limb: Secondary | ICD-10-CM | POA: Diagnosis not present

## 2020-04-02 DIAGNOSIS — E785 Hyperlipidemia, unspecified: Secondary | ICD-10-CM | POA: Diagnosis not present

## 2020-04-02 DIAGNOSIS — W19XXXD Unspecified fall, subsequent encounter: Secondary | ICD-10-CM | POA: Diagnosis not present

## 2020-04-02 DIAGNOSIS — I129 Hypertensive chronic kidney disease with stage 1 through stage 4 chronic kidney disease, or unspecified chronic kidney disease: Secondary | ICD-10-CM | POA: Diagnosis not present

## 2020-04-02 DIAGNOSIS — K219 Gastro-esophageal reflux disease without esophagitis: Secondary | ICD-10-CM | POA: Diagnosis not present

## 2020-04-02 DIAGNOSIS — I10 Essential (primary) hypertension: Secondary | ICD-10-CM | POA: Diagnosis not present

## 2020-04-02 DIAGNOSIS — M25551 Pain in right hip: Secondary | ICD-10-CM | POA: Diagnosis not present

## 2020-04-02 DIAGNOSIS — B379 Candidiasis, unspecified: Secondary | ICD-10-CM | POA: Diagnosis not present

## 2020-04-02 DIAGNOSIS — I4510 Unspecified right bundle-branch block: Secondary | ICD-10-CM | POA: Diagnosis not present

## 2020-04-02 DIAGNOSIS — M2550 Pain in unspecified joint: Secondary | ICD-10-CM | POA: Diagnosis not present

## 2020-04-02 DIAGNOSIS — S72001A Fracture of unspecified part of neck of right femur, initial encounter for closed fracture: Secondary | ICD-10-CM | POA: Diagnosis not present

## 2020-04-02 DIAGNOSIS — E039 Hypothyroidism, unspecified: Secondary | ICD-10-CM | POA: Diagnosis not present

## 2020-04-02 DIAGNOSIS — K439 Ventral hernia without obstruction or gangrene: Secondary | ICD-10-CM | POA: Diagnosis not present

## 2020-04-02 DIAGNOSIS — Z87891 Personal history of nicotine dependence: Secondary | ICD-10-CM | POA: Diagnosis not present

## 2020-04-02 DIAGNOSIS — Z8781 Personal history of (healed) traumatic fracture: Secondary | ICD-10-CM | POA: Diagnosis not present

## 2020-04-02 DIAGNOSIS — R0902 Hypoxemia: Secondary | ICD-10-CM | POA: Diagnosis not present

## 2020-04-02 DIAGNOSIS — I959 Hypotension, unspecified: Secondary | ICD-10-CM | POA: Diagnosis not present

## 2020-04-02 DIAGNOSIS — S72034A Nondisplaced midcervical fracture of right femur, initial encounter for closed fracture: Secondary | ICD-10-CM | POA: Diagnosis not present

## 2020-04-02 DIAGNOSIS — E559 Vitamin D deficiency, unspecified: Secondary | ICD-10-CM | POA: Diagnosis not present

## 2020-04-02 DIAGNOSIS — M62838 Other muscle spasm: Secondary | ICD-10-CM | POA: Diagnosis not present

## 2020-04-02 DIAGNOSIS — M109 Gout, unspecified: Secondary | ICD-10-CM | POA: Diagnosis not present

## 2020-04-02 DIAGNOSIS — Z7401 Bed confinement status: Secondary | ICD-10-CM | POA: Diagnosis not present

## 2020-04-02 DIAGNOSIS — S72001D Fracture of unspecified part of neck of right femur, subsequent encounter for closed fracture with routine healing: Secondary | ICD-10-CM | POA: Diagnosis not present

## 2020-04-02 LAB — SARS CORONAVIRUS 2 (TAT 6-24 HRS): SARS Coronavirus 2: NEGATIVE

## 2020-04-02 LAB — BASIC METABOLIC PANEL
Anion gap: 7 (ref 5–15)
BUN: 43 mg/dL — ABNORMAL HIGH (ref 8–23)
CO2: 26 mmol/L (ref 22–32)
Calcium: 9.3 mg/dL (ref 8.9–10.3)
Chloride: 103 mmol/L (ref 98–111)
Creatinine, Ser: 1.24 mg/dL — ABNORMAL HIGH (ref 0.44–1.00)
GFR, Estimated: 41 mL/min — ABNORMAL LOW (ref >60)
Glucose, Bld: 93 mg/dL (ref 70–99)
Potassium: 4.1 mmol/L (ref 3.5–5.1)
Sodium: 136 mmol/L (ref 135–145)

## 2020-04-02 LAB — RESP PANEL BY RT-PCR (FLU A&B, COVID) ARPGX2
Influenza A by PCR: NEGATIVE
Influenza B by PCR: NEGATIVE
SARS Coronavirus 2 by RT PCR: NEGATIVE

## 2020-04-02 MED ORDER — CALCIUM CARBONATE-VITAMIN D 500-200 MG-UNIT PO TABS: 1.0000 | ORAL_TABLET | Freq: Two times a day (BID) | ORAL | Status: DC

## 2020-04-02 MED ORDER — METHOCARBAMOL 500 MG PO TABS: 500.0000 mg | ORAL_TABLET | Freq: Three times a day (TID) | ORAL | Status: DC | PRN

## 2020-04-02 MED ORDER — POLYETHYLENE GLYCOL 3350 17 G PO PACK: 17.0000 g | PACK | Freq: Every day | ORAL | 0 refills | Status: DC | PRN

## 2020-04-02 MED ORDER — ACETAMINOPHEN 325 MG PO TABS: 650.0000 mg | ORAL_TABLET | Freq: Four times a day (QID) | ORAL | 0 refills | Status: AC

## 2020-04-02 MED ORDER — TRAMADOL HCL 50 MG PO TABS: 50.0000 mg | ORAL_TABLET | Freq: Four times a day (QID) | ORAL | 0 refills | Status: AC | PRN

## 2020-04-02 MED ORDER — DOCUSATE SODIUM 100 MG PO CAPS: 100.0000 mg | ORAL_CAPSULE | Freq: Two times a day (BID) | ORAL | 0 refills | Status: DC

## 2020-04-02 MED ORDER — NAPHAZOLINE-PHENIRAMINE 0.025-0.3 % OP SOLN: 2.0000 [drp] | Freq: Four times a day (QID) | OPHTHALMIC | 0 refills | Status: DC | PRN

## 2020-04-02 MED ORDER — BISACODYL 10 MG RE SUPP: 10.0000 mg | Freq: Every day | RECTAL | 0 refills | Status: DC | PRN

## 2020-04-02 MED ORDER — ALLOPURINOL 100 MG PO TABS: 200.0000 mg | ORAL_TABLET | Freq: Every day | ORAL | Status: DC

## 2020-04-02 MED ORDER — ENOXAPARIN SODIUM 30 MG/0.3ML ~~LOC~~ SOLN: 30.0000 mg | SUBCUTANEOUS | 0 refills | Status: DC

## 2020-04-02 MED ORDER — ALUM & MAG HYDROXIDE-SIMETH 200-200-20 MG/5ML PO SUSP: 30.0000 mL | ORAL | 0 refills | Status: DC | PRN

## 2020-04-02 MED ORDER — ONDANSETRON HCL 4 MG PO TABS: 4.0000 mg | ORAL_TABLET | Freq: Four times a day (QID) | ORAL | 0 refills | Status: DC | PRN

## 2020-04-02 MED ORDER — ENSURE ENLIVE PO LIQD: 237.0000 mL | Freq: Two times a day (BID) | ORAL | 12 refills | Status: DC

## 2020-04-02 MED ORDER — MAGNESIUM CITRATE PO SOLN: 1.0000 | Freq: Once | ORAL | Status: DC | PRN

## 2020-04-02 NOTE — Care Management Important Message (Signed)
Important Message  Patient Details  Name: [name] [name] MRN: [id number] Date of Birth: 04/14/1926   Medicare Important Message Given:  Yes     Dannette Barbara 04/02/2020, 2:32 PM

## 2020-04-02 NOTE — Progress Notes (Signed)
Physical Therapy Treatment Patient Details Name: [name] [name] MRN: [id number] DOB: Nov 11, 1926 Today's Date: 04/02/2020    History of Present Illness Pt is 87 y.o. female with medical history significant for hypertension with complications of stage III chronic kidney disease, chronic bilateral lower extremity swelling and dyslipidemia who presented to the ED via EMS on 03/27/2020 after a mechanical fall at home and persistent right hip pain with inability to bear weight on right lower extremity.  R Hip pinning performed on 03/28/20.    PT Comments    Pt sitting on EOB upon arrival.  Steady. Stood with min a x 1 with walker to allow daughter to assist with pericare.  She then transferred to recliner with RW and min/mod a x 1 and encouragement to stand fully.  Seated tx as below.  Unable to progress gait due to inability to maintain ttwb.   Follow Up Recommendations  SNF;Supervision/Assistance - 24 hour     Equipment Recommendations  None recommended by PT    Recommendations for Other Services       Precautions / Restrictions Precautions Precautions: Fall Restrictions Weight Bearing Restrictions: Yes RLE Weight Bearing: Touchdown weight bearing RLE Partial Weight Bearing Percentage or Pounds: 10% ttwb    Mobility  Bed Mobility               General bed mobility comments: sitting EOB upon arrival.  Daughter in room    Transfers Overall transfer level: Needs assistance Equipment used: Rolling walker (2 wheeled) Transfers: Sit to/from Stand Sit to Stand: Min assist;Mod assist            Ambulation/Gait Ambulation/Gait assistance: Mod assist Gait Distance (Feet): 2 Feet Assistive device: Rolling walker (2 wheeled) Gait Pattern/deviations: Step-to pattern Gait velocity: decreased   General Gait Details: unable to progress gait past transfer due to inability to maintain WB status   Stairs             Wheelchair Mobility    Modified Rankin (Stroke  Patients Only)       Balance Overall balance assessment: Needs assistance Sitting-balance support: Feet supported Sitting balance-Leahy Scale: Fair     Standing balance support: Bilateral upper extremity supported;During functional activity Standing balance-Leahy Scale: Poor                              Cognition Arousal/Alertness: Awake/alert Behavior During Therapy: WFL for tasks assessed/performed Overall Cognitive Status: Within Functional Limits for tasks assessed                                 General Comments: Pt is HOH but A and oriented x 3. very pleasant and cooperative throughout. supportive daughter present and extremely helpful. assisted as needed for +2 assist      Exercises Other Exercises Other Exercises: seated AROM 2 x 10    General Comments        Pertinent Vitals/Pain Pain Assessment: No/denies pain    Home Living                      Prior Function            PT Goals (current goals can now be found in the care plan section) Progress towards PT goals: Progressing toward goals    Frequency    BID      PT Plan Current plan  remains appropriate    Co-evaluation              AM-PAC PT "6 Clicks" Mobility   Outcome Measure  Help needed turning from your back to your side while in a flat bed without using bedrails?: A Lot Help needed moving from lying on your back to sitting on the side of a flat bed without using bedrails?: A Lot Help needed moving to and from a bed to a chair (including a wheelchair)?: A Lot Help needed standing up from a chair using your arms (e.g., wheelchair or bedside chair)?: A Lot Help needed to walk in hospital room?: Total Help needed climbing 3-5 steps with a railing? : Total 6 Click Score: 10    End of Session Equipment Utilized During Treatment: Gait belt Activity Tolerance: Patient tolerated treatment well;Patient limited by fatigue Patient left: in chair;with  call bell/phone within reach;with chair alarm set Nurse Communication: Mobility status Pain - Right/Left: Right Pain - part of body: Leg     Time: [id number] PT Time Calculation (min) (ACUTE ONLY): 10 min  Charges:  $Therapeutic Activity: 8-22 mins                    Chesley Noon, PTA 04/02/20, 12:55 PM

## 2020-04-02 NOTE — Progress Notes (Signed)
This RN provided discharge teaching and instructions to the patient's daughter. The patient's daughter verbalized and demonstrated understanding of the provided instructions. All outstanding questions resolved. R arm PIV removed. Cannula intact. Pt tolerated well. All belongings packed and in tow. Pt awaiting transport for discharge to [facility].  Report given to [employer]. This RN provided report to Claiborne Billings, the nurse assuming care of the patient. All outstanding questions resolved.

## 2020-04-02 NOTE — Progress Notes (Signed)
This RN called [employer] for the third time. Message Left with Network engineer. Callback number provided to give report.

## 2020-04-02 NOTE — Plan of Care (Signed)
  Problem: Education: Goal: Knowledge of General Education information will improve Description: Including pain rating scale, medication(s)/side effects and non-pharmacologic comfort measures 04/02/2020 1453 by Cristela Blue, RN Outcome: Adequate for Discharge 04/02/2020 1453 by Cristela Blue, RN Outcome: Adequate for Discharge   Problem: Health Behavior/Discharge Planning: Goal: Ability to manage health-related needs will improve 04/02/2020 1453 by Cristela Blue, RN Outcome: Adequate for Discharge 04/02/2020 1453 by Cristela Blue, RN Outcome: Adequate for Discharge   Problem: Clinical Measurements: Goal: Ability to maintain clinical measurements within normal limits will improve 04/02/2020 1453 by Cristela Blue, RN Outcome: Adequate for Discharge 04/02/2020 1453 by Cristela Blue, RN Outcome: Adequate for Discharge Goal: Will remain free from infection 04/02/2020 1453 by Cristela Blue, RN Outcome: Adequate for Discharge 04/02/2020 1453 by Cristela Blue, RN Outcome: Adequate for Discharge Goal: Diagnostic test results will improve 04/02/2020 1453 by Cristela Blue, RN Outcome: Adequate for Discharge 04/02/2020 1453 by Cristela Blue, RN Outcome: Adequate for Discharge Goal: Respiratory complications will improve 04/02/2020 1453 by Cristela Blue, RN Outcome: Adequate for Discharge 04/02/2020 1453 by Cristela Blue, RN Outcome: Adequate for Discharge Goal: Cardiovascular complication will be avoided 04/02/2020 1453 by Cristela Blue, RN Outcome: Adequate for Discharge 04/02/2020 1453 by Cristela Blue, RN Outcome: Adequate for Discharge   Problem: Activity: Goal: Risk for activity intolerance will decrease 04/02/2020 1453 by Cristela Blue, RN Outcome: Adequate for Discharge 04/02/2020 1453 by Cristela Blue, RN Outcome: Adequate for Discharge   Problem: Nutrition: Goal: Adequate nutrition will be maintained 04/02/2020 1453 by Cristela Blue, RN Outcome: Adequate for  Discharge 04/02/2020 1453 by Cristela Blue, RN Outcome: Adequate for Discharge   Problem: Coping: Goal: Level of anxiety will decrease 04/02/2020 1453 by Cristela Blue, RN Outcome: Adequate for Discharge 04/02/2020 1453 by Cristela Blue, RN Outcome: Adequate for Discharge   Problem: Elimination: Goal: Will not experience complications related to bowel motility 04/02/2020 1453 by Cristela Blue, RN Outcome: Adequate for Discharge 04/02/2020 1453 by Cristela Blue, RN Outcome: Adequate for Discharge Goal: Will not experience complications related to urinary retention 04/02/2020 1453 by Cristela Blue, RN Outcome: Adequate for Discharge 04/02/2020 1453 by Cristela Blue, RN Outcome: Adequate for Discharge   Problem: Pain Managment: Goal: General experience of comfort will improve 04/02/2020 1453 by Cristela Blue, RN Outcome: Adequate for Discharge 04/02/2020 1453 by Cristela Blue, RN Outcome: Adequate for Discharge   Problem: Safety: Goal: Ability to remain free from injury will improve 04/02/2020 1453 by Cristela Blue, RN Outcome: Adequate for Discharge 04/02/2020 1453 by Cristela Blue, RN Outcome: Adequate for Discharge   Problem: Skin Integrity: Goal: Risk for impaired skin integrity will decrease 04/02/2020 1453 by Cristela Blue, RN Outcome: Adequate for Discharge 04/02/2020 1453 by Cristela Blue, RN Outcome: Adequate for Discharge

## 2020-04-02 NOTE — Progress Notes (Signed)
Subjective:  POD #5 s/p percutaneous fixation of right femoral neck hip fracture.   Patient reports right hip pain as mild to moderate.  Patient up out of bed to a chair eating lunch.  Her daughter is at the bedside.  Objective:   VITALS:   Vitals:   04/02/20 0019 04/02/20 0402 04/02/20 0747 04/02/20 1112  BP: (!) 135/52 (!) 93/44 (!) 107/46 (!) 95/59  Pulse: 79 87 73 87  Resp: '16 16 16 17  '$ Temp: 98.3 F (36.8 C) (!) 97.5 F (36.4 C) 98.6 F (37 C) 98.4 F (36.9 C)  TempSrc:  Oral  Oral  SpO2: 98% 96% 96% 95%  Weight:      Height:        PHYSICAL EXAM: Right lower extremity Neurovascular intact Sensation intact distally Intact pulses distally Dorsiflexion/Plantar flexion intact Incision: dressing C/D/I No cellulitis present Compartment soft  LABS  Results for orders placed or performed during the hospital encounter of 03/27/20 (from the past 24 hour(s))  Basic metabolic panel     Status: Abnormal   Collection Time: 04/02/20  5:43 AM  Result Value Ref Range   Sodium 136 135 - 145 mmol/L   Potassium 4.1 3.5 - 5.1 mmol/L   Chloride 103 98 - 111 mmol/L   CO2 26 22 - 32 mmol/L   Glucose, Bld 93 70 - 99 mg/dL   BUN 43 (H) 8 - 23 mg/dL   Creatinine, Ser 1.24 (H) 0.44 - 1.00 mg/dL   Calcium 9.3 8.9 - 10.3 mg/dL   GFR, Estimated 41 (L) >60 mL/min   Anion gap 7 5 - 15    CT ABDOMEN PELVIS W CONTRAST  Result Date: 04/01/2020 CLINICAL DATA:  Firm bulge in the abdomen. EXAM: CT ABDOMEN AND PELVIS WITH CONTRAST TECHNIQUE: Multidetector CT imaging of the abdomen and pelvis was performed using the standard protocol following bolus administration of intravenous contrast. CONTRAST:  144m OMNIPAQUE IOHEXOL 300 MG/ML  SOLN COMPARISON:  None. FINDINGS: Lower chest: There is atelectasis versus scarring at the lung bases.The heart is enlarged. Hepatobiliary: The liver is normal. Status post cholecystectomy.There is no biliary ductal dilation. Pancreas: Normal contours without ductal  dilatation. No peripancreatic fluid collection. Spleen: Unremarkable. Adrenals/Urinary Tract: --Adrenal glands: There is a benign appearing left adrenal adenoma. --Right kidney/ureter: The right kidney is atrophic. --Left kidney/ureter: There is atrophy of the lower pole left kidney with multiple nonobstructing stones measuring up to approximately 6 mm. --Urinary bladder: The urinary bladder is distended. Stomach/Bowel: --Stomach/Duodenum: There is a large hiatal hernia. --Small bowel: Unremarkable. --Colon: Unremarkable. --Appendix: Normal. Vascular/Lymphatic: Atherosclerotic calcification is present within the non-aneurysmal abdominal aorta, without hemodynamically significant stenosis. --No retroperitoneal lymphadenopathy. --No mesenteric lymphadenopathy. --No pelvic or inguinal lymphadenopathy. Reproductive: Status post hysterectomy. No adnexal mass. Other: No ascites or free air. There is pelvic floor laxity with evidence for a rectocele. Musculoskeletal. No acute displaced fractures. IMPRESSION: 1. Large hiatal hernia. 2. Pelvic floor laxity with evidence for a rectocele. Aortic Atherosclerosis (ICD10-I70.0). Electronically Signed   By: CConstance HolsterM.D.   On: 04/01/2020 15:12    Assessment/Plan: 5 Days Post-Op   Principal Problem:   Closed displaced fracture of right femoral neck (HCC) Active Problems:   Hypertension   Hypothyroid   CKD (chronic kidney disease) stage 4, GFR 15-29 ml/min (HCC)   Cellulitis  Patient improving postop.  Given her clinical improvement she is being transferred to SNF today.  Patient will continue partial weightbearing restriction for 6 weeks postop.  Patient has  no range of motion limitations.  Patient will be discharged on 30 mg of Lovenox daily for DVT prophylaxis until follow-up versus aspirin 325 mg p.o. twice daily.  Patient will follow up with Dr. Mack Guise in the office in 10 to 14 days after discharge.    Thornton Park , MD 04/02/2020, 1:20  PM

## 2020-04-02 NOTE — Plan of Care (Signed)
?  Problem: Coping: ?Goal: Level of anxiety will decrease ?Outcome: Progressing ?  ?Problem: Safety: ?Goal: Ability to remain free from injury will improve ?Outcome: Progressing ?  ?

## 2020-04-02 NOTE — Discharge Summary (Signed)
Physician Discharge Summary  [name] [name] [id number] DOB: 08-03-1926 DOA: 03/27/2020  PCP: Birdie Sons, MD  Admit date: 03/27/2020 Discharge date: 04/02/2020  Admitted From: home Disposition:  SNF  Recommendations for Outpatient Follow-up:  1. Follow up with PCP in 1-2 weeks 2. Please obtain BMP/CBC in one week 3. Follow up with Dr. Cindi Carbon, orthopedic surgeon, in 10-14 days 4. Please follow up outpatient with general surgery regarding ventral hernia.  Imaging as below was reassuring for no complications 5. Please monitor patient's blood pressure and consider dose reductions if frequently on low side.  She had some transient softer BP's but no overt hypotension.  Home Health: no  Equipment/Devices: n/a   Discharge Condition: Stable  CODE STATUS: DNR  Diet recommendation: Regular  Discharge Diagnoses: Principal Problem:   Closed displaced fracture of right femoral neck (HCC) Active Problems:   Hypertension   Hypothyroid   CKD (chronic kidney disease) stage 4, GFR 15-29 ml/min (HCC)   Cellulitis    Summary of HPI and Hospital Course:  87 y.o. female with medical history significant for hypertension with complications of stage III chronic kidney disease, chronic bilateral lower extremity swelling and dyslipidemia who presented to the ED via EMS on 03/27/2020 after a mechanical fall at home and persistent right hip pain with inability to bear weight on right lower extremity.    Right femur x-ray in the ED showed an acute mildly angulated and impacted subcapital right femoral neck fracture.  Admitted to hospitalist service with orthopedics consulted.   Closed is displaced right femoral neck fracture - Present on admission due to mechanical fall. Orthopedics was consulted.    Underwent repair via screw fixation on 2/9 with Dr. Mack Guise. Postoperatively, patient has only had pain with movement, not much at rest. --Continue scheduled Tylenol for another 1-2 weeks,  then try on as needed basis to assess if needed. --Tramadol as needed if pain not controlled with Tylenol (consider pre-treating for therapy sessions to optimize her ability to participate if pain is limiting). --Bowel regimen, ensure regular BM's --Continue Robaxin PRN for muscle spasms ORTHOPEDIC RECOMMENDATIONS: --Continue 10% weightbearing on the right lower extremity   --Continue physical therapy   --There are no right hip range of motion limitations to the patient   --Lovenox 30 mg daily  x 2 weeks DVT prophylaxis  --Follow up with Dr. Mack Guise in clinic in 10-14 days after discharge.  Large ventral hernia - noted by nursing to have quite distended protuberance of her abdomen.  Has large ventral hernia.  General surgery consulted for recommendations.  Outpatient follow up.  Unlikely to require surgical intervention as CT abdomen/pelvis was reassuring of no complications.  Right lower extremity cellulitis -present on admission with erythematous distal right lower extremity.  Patient was afebrile but did have some mild leukocytosis.   Completed course of antibiotics during admission.   Erythema resolving very well, associated edema resolved. Monitor extremity for any signs of recurrence.  Essential hypertension -takes HCTZ-ARB and amlodipine, continue.  Monitor BP.  CKD stage IV - due to hypertension.  Renal function near baseline.  BMP in follow-up to monitor  Hypothyroidism -continue Synthroid  GERD -continue Protonix  History of gout -not acutely flared.  Continue allopurinol      Discharge Instructions    Allergies as of 04/02/2020      Reactions   Atorvastatin Other (See Comments)   myalgias/arthralgias.   Lovastatin    Other reaction(s): Muscle Pain   Simvastatin    Elevated CK  Medication List    TAKE these medications   acetaminophen 325 MG tablet Commonly known as: TYLENOL Take 2 tablets (650 mg total) by mouth every 6 (six) hours for 14  days.   allopurinol 100 MG tablet Commonly known as: ZYLOPRIM Take 2 tablets (200 mg total) by mouth daily. What changed: See the new instructions.   alum & mag hydroxide-simeth 200-200-20 MG/5ML suspension Commonly known as: MAALOX/MYLANTA Take 30 mLs by mouth every 4 (four) hours as needed for indigestion.   amLODipine 5 MG tablet Commonly known as: NORVASC Take 1 tablet (5 mg total) by mouth daily.   bisacodyl 10 MG suppository Commonly known as: DULCOLAX Place 1 suppository (10 mg total) rectally daily as needed for moderate constipation.   calcium-vitamin D 500-200 MG-UNIT tablet Commonly known as: OSCAL WITH D Take 1 tablet by mouth 2 (two) times daily. What changed:   medication strength  when to take this   clotrimazole 1 % cream Commonly known as: Clotrimazole Anti-Fungal Apply 1 application topically 2 (two) times daily.   docusate sodium 100 MG capsule Commonly known as: COLACE Take 1 capsule (100 mg total) by mouth 2 (two) times daily. Hold if having loose or frequent BM's. What changed:   when to take this  additional instructions   enoxaparin 30 MG/0.3ML injection Commonly known as: LOVENOX Inject 0.3 mLs (30 mg total) into the skin daily for 14 days.   feeding supplement Liqd Take 237 mLs by mouth 2 (two) times daily between meals.   levothyroxine 88 MCG tablet Commonly known as: SYNTHROID TAKE 1 TABLET BY MOUTH EVERY DAY BEFORE BREAKFAST What changed: See the new instructions.   magnesium citrate Soln Take 296 mLs (1 Bottle total) by mouth once as needed for severe constipation.   methocarbamol 500 MG tablet Commonly known as: ROBAXIN Take 1 tablet (500 mg total) by mouth every 8 (eight) hours as needed for muscle spasms.   naphazoline-pheniramine 0.025-0.3 % ophthalmic solution Commonly known as: NAPHCON-A Place 2 drops into the right eye 4 (four) times daily as needed for eye irritation.   omeprazole 20 MG capsule Commonly known as:  PRILOSEC TAKE 1 CAPSULE BY MOUTH DAILY   ondansetron 4 MG tablet Commonly known as: ZOFRAN Take 1 tablet (4 mg total) by mouth every 6 (six) hours as needed for nausea.   polyethylene glycol 17 g packet Commonly known as: MIRALAX / GLYCOLAX Take 17 g by mouth daily as needed for mild constipation.   PRESERVISION AREDS 2 PO Take 1 tablet by mouth 2 (two) times daily.   traMADol 50 MG tablet Commonly known as: ULTRAM Take 1 tablet (50 mg total) by mouth every 6 (six) hours as needed for up to 7 days for moderate pain or severe pain (if pain not controlled by Tylenol).   valsartan-hydrochlorothiazide 320-25 MG tablet Commonly known as: DIOVAN-HCT TAKE ONE TABLET BY MOUTH EVERY DAY       Contact information for follow-up providers    Birdie Sons, MD. Call.   Specialty: Family Medicine Why: Please see for hospital follow up Contact information: [address] [place] [address] [phone]        Thornton Park, MD. Schedule an appointment as soon as possible for a visit in 10 day(s).   Specialty: Orthopedic Surgery Why: Post-op follow up in 10-14 days Contact information: [place] [address] [phone]            Contact information for after-discharge care  [place] [facility] .   Service: Skilled Nursing Contact information: [place] [phone]                 Allergies  Allergen Reactions  . Atorvastatin Other (See Comments)    myalgias/arthralgias.  . Lovastatin     Other reaction(s): Muscle Pain  . Simvastatin     Elevated CK     If you experience worsening of your admission symptoms, develop shortness of breath, life threatening emergency, suicidal or homicidal thoughts you must seek medical attention immediately by calling 911 or calling your MD immediately  if  symptoms less severe.    Please note   You were cared for by a hospitalist during your hospital stay. If you have any questions about your discharge medications or the care you received while you were in the hospital after you are discharged, you can call the unit and asked to speak with the hospitalist on call if the hospitalist that took care of you is not available. Once you are discharged, your primary care physician will handle any further medical issues. Please note that NO REFILLS for any discharge medications will be authorized once you are discharged, as it is imperative that you return to your primary care physician (or establish a relationship with a primary care physician if you do not have one) for your aftercare needs so that they can reassess your need for medications and monitor your lab values.   Consultations:  Orthopedics  General surgery    Procedures/Studies: DG Chest 1 View  Result Date: 03/27/2020 CLINICAL DATA:  Preoperative evaluation, former smoker EXAM: CHEST  1 VIEW COMPARISON:  None. FINDINGS: Interstitial prominence, greatest at the lung bases. No significant pleural effusion. Cardiomediastinal contours are within normal limits. IMPRESSION: Interstitial prominence, greatest at the lung bases. In the absence of acute symptoms, may reflect chronic changes of COPD or fibrosis. Electronically Signed   By: Macy Mis M.D.   On: 03/27/2020 11:01   CT ABDOMEN PELVIS W CONTRAST  Result Date: 04/01/2020 CLINICAL DATA:  Firm bulge in the abdomen. EXAM: CT ABDOMEN AND PELVIS WITH CONTRAST TECHNIQUE: Multidetector CT imaging of the abdomen and pelvis was performed using the standard protocol following bolus administration of intravenous contrast. CONTRAST:  139m OMNIPAQUE IOHEXOL 300 MG/ML  SOLN COMPARISON:  None. FINDINGS: Lower chest: There is atelectasis versus scarring at the lung bases.The heart is enlarged. Hepatobiliary: The liver is normal. Status post  cholecystectomy.There is no biliary ductal dilation. Pancreas: Normal contours without ductal dilatation. No peripancreatic fluid collection. Spleen: Unremarkable. Adrenals/Urinary Tract: --Adrenal glands: There is a benign appearing left adrenal adenoma. --Right kidney/ureter: The right kidney is atrophic. --Left kidney/ureter: There is atrophy of the lower pole left kidney with multiple nonobstructing stones measuring up to approximately 6 mm. --Urinary bladder: The urinary bladder is distended. Stomach/Bowel: --Stomach/Duodenum: There is a large hiatal hernia. --Small bowel: Unremarkable. --Colon: Unremarkable. --Appendix: Normal. Vascular/Lymphatic: Atherosclerotic calcification is present within the non-aneurysmal abdominal aorta, without hemodynamically significant stenosis. --No retroperitoneal lymphadenopathy. --No mesenteric lymphadenopathy. --No pelvic or inguinal lymphadenopathy. Reproductive: Status post hysterectomy. No adnexal mass. Other: No ascites or free air. There is pelvic floor laxity with evidence for a rectocele. Musculoskeletal. No acute displaced fractures. IMPRESSION: 1. Large hiatal hernia. 2. Pelvic floor laxity with evidence for a rectocele. Aortic Atherosclerosis (ICD10-I70.0). Electronically Signed   By: CConstance HolsterM.D.   On:  04/01/2020 15:12   DG Hip Port Unilat With Pelvis 1V Right  Result Date: 03/28/2020 CLINICAL DATA:  Right hip fracture status post pinning EXAM: DG HIP (WITH OR WITHOUT PELVIS) 1V PORT RIGHT COMPARISON:  03/27/2020 FINDINGS: Frontal and cross-table lateral views of the right hip are obtained. Three cannulated screws traverse the subcapital fracture seen previously, with anatomic alignment. Postsurgical changes are seen in the overlying soft tissues. The remainder of the bony pelvis is unremarkable. IMPRESSION: 1. Subcapital right femoral neck fracture status post pinning. Anatomic alignment. Electronically Signed   By: Randa Ngo M.D.   On:  03/28/2020 16:07   DG HIP OPERATIVE UNILAT W OR W/O PELVIS RIGHT  Result Date: 03/28/2020 CLINICAL DATA:  Right hip fracture EXAM: OPERATIVE RIGHT HIP WITH PELVIS COMPARISON:  03/27/2020 FLUOROSCOPY TIME:  Radiation Exposure Index (as provided by the fluoroscopic device): Not available If the device does not provide the exposure index: Fluoroscopy Time:  47 seconds Number of Acquired Images:  2 FINDINGS: Three compression screws are noted traversing the femoral neck on the right. Fracture fragments are in near anatomic alignment. IMPRESSION: ORIF of right femoral fracture. Electronically Signed   By: Inez Catalina M.D.   On: 03/28/2020 15:25   DG Hip Unilat W or Wo Pelvis 2-3 Views Right  Result Date: 03/27/2020 CLINICAL DATA:  87 year old female status post fall.  Pain. EXAM: DG HIP (WITH OR WITHOUT PELVIS) 2-3V RIGHT COMPARISON:  Right femur series 06/13/2019. Right hip series 10/16/2014. FINDINGS: The pelvis is mildly oblique to the right today. Femoral heads remain normally located. The bony pelvis appears stable since 2016 and intact. SI joints are within normal limits. Pronounced chronic dextroconvex lumbar scoliosis is partially visible. Grossly intact proximal left femur. Mildly impacted subcapital right femoral neck fracture best demonstrated on image 2. Mild valgus angulation. The proximal right femur intertrochanteric segment appears to remain intact. Negative visible lower abdominal and pelvic visceral contours. IMPRESSION: Acute mildly angulated and impacted subcapital right femoral neck fracture. Electronically Signed   By: Genevie Ann M.D.   On: 03/27/2020 10:08     Surgical repair of Hip Fracture on 2/9  Subjective: Pt doing well, has no complaints.  Still only pain is with moving.  Right leg redness continuing to improve   Discharge Exam: Vitals:   04/02/20 0402 04/02/20 0747  BP: (!) 93/44 (!) 107/46  Pulse: 87 73  Resp: 16 16  Temp: (!) 97.5 F (36.4 C) 98.6 F (37 C)  SpO2: 96%  96%   Vitals:   04/01/20 2117 04/02/20 0019 04/02/20 0402 04/02/20 0747  BP: (!) 106/59 (!) 135/52 (!) 93/44 (!) 107/46  Pulse: 71 79 87 73  Resp: '16 16 16 16  '$ Temp: 97.7 F (36.5 C) 98.3 F (36.8 C) (!) 97.5 F (36.4 C) 98.6 F (37 C)  TempSrc: Oral  Oral   SpO2: 92% 98% 96% 96%  Weight:      Height:        General: Pt is alert, awake, not in acute distress Cardiovascular: RRR, S1/S2 +, no rubs, no gallops Respiratory: CTA bilaterally, no wheezing, no rhonchi Abdominal: Soft, NT, ND, bowel sounds + Extremities: right lateral hip with honeycomb dressing intact, no edema, no cyanosis, right distal lower extremity erythema resolved    The results of significant diagnostics from this hospitalization (including imaging, microbiology, ancillary and laboratory) are listed below for reference.     Microbiology: Recent Results (from the past 240 hour(s))  Resp Panel by RT-PCR (Flu  A&B, Covid) Nasopharyngeal Swab     Status: None   Collection Time: 03/27/20 10:57 AM   Specimen: Nasopharyngeal Swab; Nasopharyngeal(NP) swabs in vial transport medium  Result Value Ref Range Status   SARS Coronavirus 2 by RT PCR NEGATIVE NEGATIVE Final    Comment: (NOTE) SARS-CoV-2 target nucleic acids are NOT DETECTED.  The SARS-CoV-2 RNA is generally detectable in upper respiratory specimens during the acute phase of infection. The lowest concentration of SARS-CoV-2 viral copies this assay can detect is 138 copies/mL. A negative result does not preclude SARS-Cov-2 infection and should not be used as the sole basis for treatment or other patient management decisions. A negative result may occur with  improper specimen collection/handling, submission of specimen other than nasopharyngeal swab, presence of viral mutation(s) within the areas targeted by this assay, and inadequate number of viral copies(<138 copies/mL). A negative result must be combined with clinical observations, patient history,  and epidemiological information. The expected result is Negative.  Fact Sheet for Patients:  EntrepreneurPulse.com.au  Fact Sheet for Healthcare Providers:  IncredibleEmployment.be  This test is no t yet approved or cleared by the [place] FDA and  has been authorized for detection and/or diagnosis of SARS-CoV-2 by FDA under an Emergency Use Authorization (EUA). This EUA will remain  in effect (meaning this test can be used) for the duration of the COVID-19 declaration under Section 564(b)(1) of the Act, 21 U.S.C.section 360bbb-3(b)(1), unless the authorization is terminated  or revoked sooner.       Influenza A by PCR NEGATIVE NEGATIVE Final   Influenza B by PCR NEGATIVE NEGATIVE Final    Comment: (NOTE) The Xpert Xpress SARS-CoV-2/FLU/RSV plus assay is intended as an aid in the diagnosis of influenza from Nasopharyngeal swab specimens and should not be used as a sole basis for treatment. Nasal washings and aspirates are unacceptable for Xpert Xpress SARS-CoV-2/FLU/RSV testing.  Fact Sheet for Patients: EntrepreneurPulse.com.au  Fact Sheet for Healthcare Providers: IncredibleEmployment.be  This test is not yet approved or cleared by the [place] FDA and has been authorized for detection and/or diagnosis of SARS-CoV-2 by FDA under an Emergency Use Authorization (EUA). This EUA will remain in effect (meaning this test can be used) for the duration of the COVID-19 declaration under Section 564(b)(1) of the Act, 21 U.S.C. section 360bbb-3(b)(1), unless the authorization is terminated or revoked.  Performed at [facility], [address] [place], [place] [address]   Culture, blood (routine x 2)     Status: None   Collection Time: 03/27/20  3:27 PM   Specimen: BLOOD  Result Value Ref Range Status   Specimen Description BLOOD BLOOD RIGHT FOREARM  Final   Special Requests   Final     BOTTLES DRAWN AEROBIC AND ANAEROBIC Blood Culture adequate volume   Culture   Final    NO GROWTH 5 DAYS Performed at [facility], [address], [place], [place] [address]    Report Status 04/01/2020 FINAL  Final  Culture, blood (routine x 2)     Status: None   Collection Time: 03/27/20  3:37 PM   Specimen: BLOOD  Result Value Ref Range Status   Specimen Description BLOOD LEFT ANTECUBITAL  Final   Special Requests   Final    BOTTLES DRAWN AEROBIC AND ANAEROBIC Blood Culture adequate volume   Culture   Final    NO GROWTH 5 DAYS Performed at [facility], [address], [place], [place] [address]    Report Status  04/01/2020 FINAL  Final     Labs: BNP (last 3 results) No results for input(s): BNP in the last 8760 hours. Basic Metabolic Panel: Recent Labs  Lab 03/27/20 1057 03/28/20 0556 03/29/20 0606 03/30/20 0558 04/02/20 0543  NA 140 138 140 140 136  K 3.7 3.7 3.4* 4.0 4.1  CL 107 103 102 104 103  CO2 21* '26 26 25 26  '$ GLUCOSE 99 85 79 83 93  BUN 47* 34* 32* 35* 43*  CREATININE 1.36* 1.16* 1.10* 1.09* 1.24*  CALCIUM 9.8 9.2 9.4 9.6 9.3  MG  --   --   --  1.7  --    Liver Function Tests: No results for input(s): AST, ALT, ALKPHOS, BILITOT, PROT, ALBUMIN in the last 168 hours. No results for input(s): LIPASE, AMYLASE in the last 168 hours. No results for input(s): AMMONIA in the last 168 hours. CBC: Recent Labs  Lab 03/27/20 1057 03/28/20 0556 03/29/20 0606 03/30/20 0558 03/31/20 0446  WBC 11.9* 7.7 8.4 7.9 8.0  NEUTROABS 9.4*  --   --   --   --   HGB 12.0 10.9* 10.2* 10.5* 10.5*  HCT 37.2 33.5* 31.7* 32.6* 32.4*  MCV 90.3 89.8 90.3 90.3 91.0  PLT 137* 137* 125* 133* 135*   Cardiac Enzymes: No results for input(s): CKTOTAL, CKMB, CKMBINDEX, TROPONINI in the last 168 hours. BNP: Invalid input(s): POCBNP CBG: Recent Labs  Lab 03/27/20 2114  GLUCAP 116*   D-Dimer No results for input(s): DDIMER in the last 72 hours. Hgb A1c No  results for input(s): HGBA1C in the last 72 hours. Lipid Profile No results for input(s): CHOL, HDL, LDLCALC, TRIG, CHOLHDL, LDLDIRECT in the last 72 hours. Thyroid function studies No results for input(s): TSH, T4TOTAL, T3FREE, THYROIDAB in the last 72 hours.  Invalid input(s): FREET3 Anemia work up No results for input(s): VITAMINB12, FOLATE, FERRITIN, TIBC, IRON, RETICCTPCT in the last 72 hours. Urinalysis    Component Value Date/Time   BILIRUBINUR small 06/17/2019 1204   PROTEINUR Positive (A) 06/17/2019 1204   UROBILINOGEN 0.2 06/17/2019 1204   NITRITE positive 06/17/2019 1204   LEUKOCYTESUR Large (3+) (A) 06/17/2019 1204   Sepsis Labs Invalid input(s): PROCALCITONIN,  WBC,  LACTICIDVEN Microbiology Recent Results (from the past 240 hour(s))  Resp Panel by RT-PCR (Flu A&B, Covid) Nasopharyngeal Swab     Status: None   Collection Time: 03/27/20 10:57 AM   Specimen: Nasopharyngeal Swab; Nasopharyngeal(NP) swabs in vial transport medium  Result Value Ref Range Status   SARS Coronavirus 2 by RT PCR NEGATIVE NEGATIVE Final    Comment: (NOTE) SARS-CoV-2 target nucleic acids are NOT DETECTED.  The SARS-CoV-2 RNA is generally detectable in upper respiratory specimens during the acute phase of infection. The lowest concentration of SARS-CoV-2 viral copies this assay can detect is 138 copies/mL. A negative result does not preclude SARS-Cov-2 infection and should not be used as the sole basis for treatment or other patient management decisions. A negative result may occur with  improper specimen collection/handling, submission of specimen other than nasopharyngeal swab, presence of viral mutation(s) within the areas targeted by this assay, and inadequate number of viral copies(<138 copies/mL). A negative result must be combined with clinical observations, patient history, and epidemiological information. The expected result is Negative.  Fact Sheet for Patients:   EntrepreneurPulse.com.au  Fact Sheet for Healthcare Providers:  IncredibleEmployment.be  This test is no t yet approved or cleared by the [place] FDA and  has been authorized for detection and/or diagnosis  of SARS-CoV-2 by FDA under an Emergency Use Authorization (EUA). This EUA will remain  in effect (meaning this test can be used) for the duration of the COVID-19 declaration under Section 564(b)(1) of the Act, 21 U.S.C.section 360bbb-3(b)(1), unless the authorization is terminated  or revoked sooner.       Influenza A by PCR NEGATIVE NEGATIVE Final   Influenza B by PCR NEGATIVE NEGATIVE Final    Comment: (NOTE) The Xpert Xpress SARS-CoV-2/FLU/RSV plus assay is intended as an aid in the diagnosis of influenza from Nasopharyngeal swab specimens and should not be used as a sole basis for treatment. Nasal washings and aspirates are unacceptable for Xpert Xpress SARS-CoV-2/FLU/RSV testing.  Fact Sheet for Patients: EntrepreneurPulse.com.au  Fact Sheet for Healthcare Providers: IncredibleEmployment.be  This test is not yet approved or cleared by the [place] FDA and has been authorized for detection and/or diagnosis of SARS-CoV-2 by FDA under an Emergency Use Authorization (EUA). This EUA will remain in effect (meaning this test can be used) for the duration of the COVID-19 declaration under Section 564(b)(1) of the Act, 21 U.S.C. section 360bbb-3(b)(1), unless the authorization is terminated or revoked.  Performed at [facility], [address] [place], [place] [address]   Culture, blood (routine x 2)     Status: None   Collection Time: 03/27/20  3:27 PM   Specimen: BLOOD  Result Value Ref Range Status   Specimen Description BLOOD BLOOD RIGHT FOREARM  Final   Special Requests   Final    BOTTLES DRAWN AEROBIC AND ANAEROBIC Blood Culture adequate volume   Culture   Final    NO  GROWTH 5 DAYS Performed at [facility], [address], [place], [place] [address]    Report Status 04/01/2020 FINAL  Final  Culture, blood (routine x 2)     Status: None   Collection Time: 03/27/20  3:37 PM   Specimen: BLOOD  Result Value Ref Range Status   Specimen Description BLOOD LEFT ANTECUBITAL  Final   Special Requests   Final    BOTTLES DRAWN AEROBIC AND ANAEROBIC Blood Culture adequate volume   Culture   Final    NO GROWTH 5 DAYS Performed at [facility], [address], [place], [place] [address]    Report Status 04/01/2020 FINAL  Final     Time coordinating discharge: Over 30 minutes  SIGNED:   Ezekiel Slocumb, DO [facility] 04/02/2020, 8:35 AM   If 7PM-7AM, please contact night-coverage [email or web address]

## 2020-04-02 NOTE — Progress Notes (Signed)
This RN called [employer] to give report x2. No response either attempts.

## 2020-04-03 ENCOUNTER — Telehealth: Payer: Self-pay

## 2020-04-03 DIAGNOSIS — L03115 Cellulitis of right lower limb: Secondary | ICD-10-CM | POA: Insufficient documentation

## 2020-04-03 DIAGNOSIS — I129 Hypertensive chronic kidney disease with stage 1 through stage 4 chronic kidney disease, or unspecified chronic kidney disease: Secondary | ICD-10-CM | POA: Insufficient documentation

## 2020-04-03 DIAGNOSIS — E039 Hypothyroidism, unspecified: Secondary | ICD-10-CM | POA: Diagnosis not present

## 2020-04-03 DIAGNOSIS — Z8781 Personal history of (healed) traumatic fracture: Secondary | ICD-10-CM | POA: Diagnosis not present

## 2020-04-03 NOTE — Telephone Encounter (Signed)
  Patient was recently discharged from the hospital on 04/02/20.  No TCM completed, patient does not qualify for TCM services due to being discharged to a SNF.  Per discharge summary patient needs follow up with PCP. HFU scheduled 04/16/20 @ 10:00 AM.

## 2020-04-11 DIAGNOSIS — S72034A Nondisplaced midcervical fracture of right femur, initial encounter for closed fracture: Secondary | ICD-10-CM | POA: Diagnosis not present

## 2020-04-16 ENCOUNTER — Inpatient Hospital Stay: Payer: PPO | Admitting: Family Medicine

## 2020-04-24 DIAGNOSIS — M109 Gout, unspecified: Secondary | ICD-10-CM | POA: Diagnosis not present

## 2020-04-24 DIAGNOSIS — E039 Hypothyroidism, unspecified: Secondary | ICD-10-CM | POA: Diagnosis not present

## 2020-04-24 DIAGNOSIS — W19XXXD Unspecified fall, subsequent encounter: Secondary | ICD-10-CM | POA: Diagnosis not present

## 2020-04-24 DIAGNOSIS — N189 Chronic kidney disease, unspecified: Secondary | ICD-10-CM | POA: Diagnosis not present

## 2020-04-24 DIAGNOSIS — I129 Hypertensive chronic kidney disease with stage 1 through stage 4 chronic kidney disease, or unspecified chronic kidney disease: Secondary | ICD-10-CM | POA: Diagnosis not present

## 2020-04-24 DIAGNOSIS — S72001D Fracture of unspecified part of neck of right femur, subsequent encounter for closed fracture with routine healing: Secondary | ICD-10-CM | POA: Diagnosis not present

## 2020-04-24 DIAGNOSIS — K5900 Constipation, unspecified: Secondary | ICD-10-CM | POA: Diagnosis not present

## 2020-04-25 ENCOUNTER — Other Ambulatory Visit: Payer: Self-pay | Admitting: Family Medicine

## 2020-04-25 ENCOUNTER — Telehealth: Payer: Self-pay

## 2020-04-25 DIAGNOSIS — E039 Hypothyroidism, unspecified: Secondary | ICD-10-CM

## 2020-04-25 NOTE — Telephone Encounter (Signed)
That's fine

## 2020-04-25 NOTE — Telephone Encounter (Signed)
Copied from [place] [id number]. Topic: Quick Communication - Home Health Verbal Orders >> Apr 25, 2020 10:05 AM Tessa Lerner A wrote: Caller/Agency: Nicholes Mango Number: [phone]  Requesting OT/PT/Skilled Nursing/Social Work/Speech Therapy: Nursing and Home Health Aid  Frequency: Nursing // 1w4 Med Man and Disease Process Teaching  Frequency: Home Health Aid // 1w6 ADL assistance  PT and OT will be doing separate evaluations within the next two days

## 2020-04-25 NOTE — Telephone Encounter (Signed)
Verbal orders given  

## 2020-05-04 ENCOUNTER — Telehealth: Payer: Self-pay

## 2020-05-04 NOTE — Telephone Encounter (Signed)
Verbal okay given.  

## 2020-05-04 NOTE — Telephone Encounter (Signed)
Copied from [place] [id number]. Topic: Quick Communication - Home Health Verbal Orders >> May 04, 2020  2:46 PM Tessa Lerner A wrote: Caller/Agency: Costella Hatcher / [place] Number: [phone]  Requesting OT/PT/Skilled Nursing/Social Work/Speech Therapy: PT  Frequency: 2w2 1w6 starting 05/07/20

## 2020-05-04 NOTE — Telephone Encounter (Signed)
That's fine

## 2020-05-07 ENCOUNTER — Telehealth: Payer: Self-pay | Admitting: Family Medicine

## 2020-05-07 NOTE — Telephone Encounter (Signed)
Left detailed message on voicemail. KW

## 2020-05-07 NOTE — Telephone Encounter (Signed)
Please review request below. KW 

## 2020-05-07 NOTE — Telephone Encounter (Signed)
That's fine

## 2020-05-07 NOTE — Telephone Encounter (Signed)
Home Health Verbal Orders - Caller/Agency: [facility]  Callback Number: [phone] secure vm can be left Requesting OT Frequency: 2 visits within 30 days to follow up

## 2020-05-09 DIAGNOSIS — S72034A Nondisplaced midcervical fracture of right femur, initial encounter for closed fracture: Secondary | ICD-10-CM | POA: Diagnosis not present

## 2020-05-10 ENCOUNTER — Telehealth: Payer: Self-pay | Admitting: Family Medicine

## 2020-05-10 DIAGNOSIS — L03116 Cellulitis of left lower limb: Secondary | ICD-10-CM

## 2020-05-10 MED ORDER — CEPHALEXIN 500 MG PO CAPS: 500.0000 mg | ORAL_CAPSULE | Freq: Two times a day (BID) | ORAL | 0 refills | Status: DC

## 2020-05-10 NOTE — Telephone Encounter (Signed)
[place] for the verbal order for the wound care to the heel  Ok to schedule patient at next available in person appt, any provider is probably ok

## 2020-05-10 NOTE — Telephone Encounter (Signed)
Verbal order for wound care to the heel given to Home health worker [place]. [name] [name] thinks that patient should be soon because patient's right leg looks as if she has cellulitis. Our office doesn't have any available appointments until Monday 05/14/2020. Please advise if you are able to work patient in, or if you have any further recommendations.

## 2020-05-10 NOTE — Telephone Encounter (Signed)
Keflex will be sent in since no availabilities

## 2020-05-10 NOTE — Telephone Encounter (Signed)
[name] [name], from [facility], called stating that she believes the pt needs to come in for an appt. She states that on the pts Right leg there is a dark red patch, some swelling, and not warm to the touch. She states that she believes this might be cellulitis. She states that the pt is not complaining of any pain as of yet. [name] [name] also states that on the pts left heel on the inside there seems to be a deep tissue injury. It is discolored and does not blanch. She states that if the pt does come into be seen she is requesting to have the provider look at the heel. She states that she would like to request a verbal order for the treatment of that area. She is requesting skin preps to area, paint area with betadine, and cover with protective bandage. She states that she will educated family for the days that she is not there. Please advise.     [phone] secure

## 2020-05-10 NOTE — Addendum Note (Signed)
Addended by: Mar Daring on: 05/10/2020 06:06 PM   Modules accepted: Orders

## 2020-05-18 DIAGNOSIS — E039 Hypothyroidism, unspecified: Secondary | ICD-10-CM | POA: Diagnosis not present

## 2020-05-18 DIAGNOSIS — K5900 Constipation, unspecified: Secondary | ICD-10-CM | POA: Diagnosis not present

## 2020-05-18 DIAGNOSIS — W19XXXD Unspecified fall, subsequent encounter: Secondary | ICD-10-CM | POA: Diagnosis not present

## 2020-05-18 DIAGNOSIS — S72001D Fracture of unspecified part of neck of right femur, subsequent encounter for closed fracture with routine healing: Secondary | ICD-10-CM | POA: Diagnosis not present

## 2020-05-18 DIAGNOSIS — M109 Gout, unspecified: Secondary | ICD-10-CM | POA: Diagnosis not present

## 2020-05-18 DIAGNOSIS — I129 Hypertensive chronic kidney disease with stage 1 through stage 4 chronic kidney disease, or unspecified chronic kidney disease: Secondary | ICD-10-CM | POA: Diagnosis not present

## 2020-05-18 DIAGNOSIS — N189 Chronic kidney disease, unspecified: Secondary | ICD-10-CM | POA: Diagnosis not present

## 2020-05-22 ENCOUNTER — Other Ambulatory Visit: Payer: Self-pay

## 2020-05-22 ENCOUNTER — Ambulatory Visit: Payer: PPO | Admitting: Podiatry

## 2020-05-22 ENCOUNTER — Telehealth: Payer: Self-pay

## 2020-05-22 DIAGNOSIS — L97422 Non-pressure chronic ulcer of left heel and midfoot with fat layer exposed: Secondary | ICD-10-CM

## 2020-05-22 DIAGNOSIS — M79676 Pain in unspecified toe(s): Secondary | ICD-10-CM

## 2020-05-22 DIAGNOSIS — L989 Disorder of the skin and subcutaneous tissue, unspecified: Secondary | ICD-10-CM | POA: Diagnosis not present

## 2020-05-22 DIAGNOSIS — B351 Tinea unguium: Secondary | ICD-10-CM

## 2020-05-22 DIAGNOSIS — L97512 Non-pressure chronic ulcer of other part of right foot with fat layer exposed: Secondary | ICD-10-CM | POA: Diagnosis not present

## 2020-05-22 NOTE — Chronic Care Management (AMB) (Addendum)
Chronic Care Management Pharmacy Assistant   Name: [name] [name]  MRN: [id number] DOB: Jun 20, 1926  Reason for Encounter: Medication Reconciliation/ Hospital discharge   Hospital visits:  Medication Reconciliation was completed by comparing discharge summary, patient's EMR and Pharmacy list, and upon discussion with patient.  Admitted to the hospital on 03/27/2020 due to Closed displaced fracture of right femoral neck. . Discharge date was 04/02/2020. Discharged from [facility].   Admitted to [name] on 04/03/2020 due to Cannulated Hip Pinning Surgery on 03/28/2020 (Right Hip) from [facility] fracture of neck of femur. Discharged 04/21/2020.    New?Medications Started at [facility] Discharge:?? -started Acetaminophen 325 mg- 2 tablets every 6 hours as needed for pain due to Closed displaced fracture of right femoral neck Tramadol 50 mg- 1 tablet every 6 hours prn due to Closed displaced fracture of right femoral neck.  Lovenox 30 mg/0.3 ml injections- Inject 0.3 mLs (30 mg total) into the skin daily for 14 days due to Closed displaced fracture of right femoral neck.   Cephalexin 500 mg started 05/10/2020 for 10 day supply. Filled at [facility].   Medication Changes at Hospital Discharge: -Changed Allopurinol 100 mg instructions to Take 2 tablets (200 mg total) by mouth daily Calcium-vitamin D 500-200 MG-UNIT tablet changed to Take 1 tablet by mouth 2 (two) times daily Docusate sodium 100 MG capsule instructions changed to Take 1 capsule (100 mg total) by mouth 2 (two) times daily. Hold if having loose or frequent BM's. Levothyroxine 88 MCG tablet instructions changed to TAKE 1 TABLET BY MOUTH EVERY DAY BEFORE BREAKFAST  Medications Discontinued at Hospital Discharge: -Stopped- None  Medications that remain the same after Hospital Discharge:??  -All other medications will remain the same.     Medications: Outpatient Encounter Medications as of 05/22/2020  Medication Sig   allopurinol (ZYLOPRIM) 100 MG tablet Take 2 tablets (200 mg total) by mouth daily.   alum & mag hydroxide-simeth (MAALOX/MYLANTA) 200-200-20 MG/5ML suspension Take 30 mLs by mouth every 4 (four) hours as needed for indigestion.   amLODipine (NORVASC) 5 MG tablet Take 1 tablet (5 mg total) by mouth daily.   bisacodyl (DULCOLAX) 10 MG suppository Place 1 suppository (10 mg total) rectally daily as needed for moderate constipation.   calcium-vitamin D (OSCAL WITH D) 500-200 MG-UNIT tablet Take 1 tablet by mouth 2 (two) times daily.   cephALEXin (KEFLEX) 500 MG capsule Take 1 capsule (500 mg total) by mouth 2 (two) times daily.   clotrimazole (CLOTRIMAZOLE ANTI-FUNGAL) 1 % cream Apply 1 application topically 2 (two) times daily.   docusate sodium (COLACE) 100 MG capsule Take 1 capsule (100 mg total) by mouth 2 (two) times daily. Hold if having loose or frequent BM's.   enoxaparin (LOVENOX) 30 MG/0.3ML injection Inject 0.3 mLs (30 mg total) into the skin daily for 14 days.   feeding supplement (ENSURE ENLIVE / ENSURE PLUS) LIQD Take 237 mLs by mouth 2 (two) times daily between meals.   levothyroxine (SYNTHROID) 88 MCG tablet TAKE 1 TABLET BY MOUTH EVERY DAY BEFORE BREAKFAST (Patient taking differently: Take 88 mcg by mouth daily before breakfast.)   magnesium citrate SOLN Take 296 mLs (1 Bottle total) by mouth once as needed for severe constipation.   methocarbamol (ROBAXIN) 500 MG tablet Take 1 tablet (500 mg total) by mouth every 8 (eight) hours as needed for muscle spasms.   Multiple Vitamins-Minerals (PRESERVISION AREDS 2 PO) Take 1 tablet  by mouth 2 (two) times daily.   naphazoline-pheniramine (NAPHCON-A) 0.025-0.3 % ophthalmic solution Place 2 drops into the right eye 4 (four) times daily as needed for eye irritation.   omeprazole (PRILOSEC) 20 MG capsule TAKE 1 CAPSULE BY MOUTH DAILY (Patient taking differently:  Take 20 mg by mouth daily.)   ondansetron (ZOFRAN) 4 MG tablet Take 1 tablet (4 mg total) by mouth every 6 (six) hours as needed for nausea.   polyethylene glycol (MIRALAX / GLYCOLAX) 17 g packet Take 17 g by mouth daily as needed for mild constipation.   valsartan-hydrochlorothiazide (DIOVAN-HCT) 320-25 MG tablet TAKE ONE TABLET BY MOUTH EVERY DAY   No facility-administered encounter medications on file as of 05/22/2020.    Star Rating Drugs: Simvastatin 20 mg- Last filled 07/29/2019 for 90 day supply at [facility].   Called patient, no answer, left message to return call, checking on patients medication and health from discharge from hospital and skilled nursing facility. Junius Argyle, PharmD notified.  SIG: [name], [place] Clinical Pharmacist Assistant [phone]   Medications were reviewed and the following drug interactions were noted:  Allopurinol + Maalox (Risk Rating D) - Aluminum Hydroxide may decrease the serum concentration of Allopurinol. Consider administering allopurinol 3 hours prior to aluminum hydroxide. Bisacodyl + Calcium (Risk Rating D) - Antacids may diminish the therapeutic effect of Bisacodyl. Antacids should not be used within 1 hour before bisacodyl administration. Bisacodyl + Maalox (Risk Rating D) - Antacids may diminish the therapeutic effect of Bisacodyl. Antacids should not be used within 1 hour before bisacodyl administration. Cephalexin + Multivitamin (Risk Rating D) - the zinc contained in many multivitamins may decrease cephalexin absorption. Consider administering multivitamins at least 3 hours after cephalexin. Levothyroxine + Maalox (Risk Rating D) - Separate administration of oral levothyroxine and aluminum hydroxide by at least 4 hours. Levothyroxine + Magnesium (Risk Rating D) -Separate administration of oral levothyroxine and magnesium by at least 4 hours. Levothyroxine + Calcium (Risk Rating D) -Separate administration of oral  levothyroxine and calcium by at least 4 hours. Maalox + Multivitamin (Risk Rating D) -Multivitamins may increase the serum concentration of Aluminum Hydroxide. Administering agents at least 2 hours apart may help minimize the interaction(s).  Multivitamin + Calcium (Risk Rating D) - antacids may decrease the absorption of orally administered iron. Separate dosing of oral iron-containing multivitamin preparations and antacids by as much time as possible in order to minimize impact on therapeutic efficacy of the iron preparation  Patient has reduced kidney function, but no renal or hepatic issues effecting drug elimination noted.   Junius Argyle, PharmD, [place] [phone]

## 2020-05-22 NOTE — Progress Notes (Signed)
   SUBJECTIVE Patient presents to office today complaining of elongated, thickened nails that cause pain while ambulating in shoes. She is unable to trim her own nails.  Patient states that the callus lesion to the right great toe has slowly recurred. She denies any pain or modifying factors. She has not had any treatment for the area. Patient also has a new complaint today associated to a blister/ulcer that is developed to the posterior medial aspect of the left heel.  The ulcer has been present for approximately 4 weeks.  The nursing facility that she resides at has been applying Betadine with a light bandage.  They have also been offloading the posterior heel  Past Medical History:  Diagnosis Date  . Hyperlipidemia   . Hypertension   . Myocardial infarction [facility])     OBJECTIVE General Patient is awake, alert, and oriented x 3 and in no acute distress.  Derm hyperkeratotic callus noted to the plantar aspect of the IPJ right hallux.  There is also hyperkeratotic preulcerative callus lesions noted to the bilateral feet Skin is dry and supple bilateral. Nails are tender, long, thickened and dystrophic with subungual debris, consistent with onychomycosis, 1-5 bilateral. No signs of infection noted. New finding of an ulcer noted to the left posterior medial heel measuring approximately 2.0 x 2.0 x 0.2 cm.  To the noted ulceration there is no eschar.  There is a minimal amount of slough fibrin and necrotic debris noted.  Granulation tissue and wound base is red.  There is no exposed bone muscle tendon ligament or joint.  No malodor noted.  Periwound intact.  Vasc  DP and PT pedal pulses palpable bilaterally. Temperature gradient within normal limits.   Neuro Epicritic and protective threshold sensation grossly intact bilaterally.   Musculoskeletal Exam No symptomatic pedal deformities noted bilateral. Muscular strength within normal limits.  Associated tenderness to palpation posterior heel  left  ASSESSMENT 1. Onychodystrophic nails 1-5 bilateral with hyperkeratosis of nails.  2. Onychomycosis of nail due to dermatophyte bilateral 3.  Preulcerative callus lesions bilateral feet 4.  Ulcer left posterior medial heel with fat layer exposed  PLAN OF CARE 1. Patient evaluated today.  2. Instructed to maintain good pedal hygiene and foot care.  3. Mechanical debridement of nails 1-5 bilaterally performed using a nail nipper. Filed with dremel without incident.  4.  Excisional debridement of the preulcerative callus lesions was performed using a chisel blade without incident or bleeding.   5.  Medically necessary excisional debridement including subcutaneous tissue was performed using a tissue nipper.  Excisional debridement of all the necrotic nonviable tissue down to healthy bleeding viable tissue was performed with post debridement measurement same as pre-.   6.  Continue Betadine with a light dressing daily  7.  Return to clinic in 4 weeks for follow-up evaluation of the left heel ulcer  Daughter is Genie.    Edrick Kins, DPM [facility]  Dr. Edrick Kins, DPM    [address], [place] [address]                Office [phone]  Fax [phone]

## 2020-05-27 ENCOUNTER — Other Ambulatory Visit: Payer: Self-pay | Admitting: Family Medicine

## 2020-05-27 DIAGNOSIS — E039 Hypothyroidism, unspecified: Secondary | ICD-10-CM

## 2020-05-27 NOTE — Telephone Encounter (Signed)
Requested medication (s) are due for refill today: yes  Requested medication (s) are on the active medication list: yes  Last refill:  03/06/20  Future visit scheduled: tomorrow 05/28/20  Notes to clinic:  overdue lab work   Requested Prescriptions  Pending Prescriptions Disp Refills   levothyroxine (SYNTHROID) 88 MCG tablet [Pharmacy Med Name: LEVOTHYROXINE SODIUM 88 MCG TAB] 90 tablet 0    Sig: TAKE 1 TABLET BY MOUTH EVERY DAY BEFORE BREAKFAST      Endocrinology:  Hypothyroid Agents Failed - 05/27/2020 12:35 PM      Failed - TSH needs to be rechecked within 3 months after an abnormal result. Refill until TSH is due.      Failed - TSH in normal range and within 360 days    TSH  Date Value Ref Range Status  01/10/2020 10.600 (H) 0.450 - 4.500 uIU/mL Final          Passed - Valid encounter within last 12 months    Recent Outpatient Visits           3 months ago Iron deficiency anemia, unspecified iron deficiency anemia type   [facility], [place]   4 months ago Iron deficiency anemia, unspecified iron deficiency anemia type   [facility] Birdie Sons, MD   8 months ago Iron deficiency anemia, unspecified iron deficiency anemia type   [facility] Birdie Sons, MD   9 months ago [place], [place], [place]   11 months ago Iron deficiency anemia, unspecified iron deficiency anemia type   [place], Kirstie Peri, MD       Future Appointments             Tomorrow Birdie Sons, MD [facility]

## 2020-05-28 ENCOUNTER — Ambulatory Visit (INDEPENDENT_AMBULATORY_CARE_PROVIDER_SITE_OTHER): Payer: PPO | Admitting: Family Medicine

## 2020-05-28 ENCOUNTER — Encounter: Payer: Self-pay | Admitting: Family Medicine

## 2020-05-28 ENCOUNTER — Other Ambulatory Visit: Payer: Self-pay

## 2020-05-28 VITALS — BP 117/64 | HR 84 | Temp 97.2°F | Resp 18 | Wt 137.0 lb

## 2020-05-28 DIAGNOSIS — E039 Hypothyroidism, unspecified: Secondary | ICD-10-CM

## 2020-05-28 DIAGNOSIS — D509 Iron deficiency anemia, unspecified: Secondary | ICD-10-CM | POA: Diagnosis not present

## 2020-05-28 DIAGNOSIS — I10 Essential (primary) hypertension: Secondary | ICD-10-CM | POA: Diagnosis not present

## 2020-05-28 MED ORDER — VALSARTAN-HYDROCHLOROTHIAZIDE 320-12.5 MG PO TABS
1.0000 | ORAL_TABLET | Freq: Every day | ORAL | 3 refills | Status: DC
Start: 2020-05-28 — End: 2021-03-25

## 2020-05-28 MED ORDER — AMLODIPINE BESYLATE 2.5 MG PO TABS
2.5000 mg | ORAL_TABLET | Freq: Every evening | ORAL | 0 refills | Status: DC
Start: 2020-05-28 — End: 2020-06-20

## 2020-05-28 NOTE — Progress Notes (Signed)
Established patient visit   Patient: [name] [name]   DOB: 1926-02-29   87 y.o. Female  MRN: [id number] Visit Date: 05/28/2020  Today's healthcare provider: [name], MD   Chief Complaint  Patient presents with  . Hypothyroidism  . Anemia  . Hypertension   Subjective    HPI  Hypothyroid, follow-up  Lab Results  Component Value Date   TSH 10.600 (H) 01/10/2020   TSH 8.960 (H) 06/13/2019   TSH 3.790 08/11/2018   FREET4 0.95 01/10/2020   Wt Readings from Last 3 Encounters:  05/28/20 137 lb (62.1 kg)  03/27/20 138 lb (62.6 kg)  02/27/20 141 lb (64 kg)    She was last seen for hypothyroid 3 months ago.  Management since that visit includes continuing same medication. She reports good compliance with treatment. She is not having side effects.   Symptoms: No change in energy level No constipation  No diarrhea No heat / cold intolerance  No nervousness No palpitations  No weight changes    -----------------------------------------------------------------------------------------  Hypertension, follow-up  BP Readings from Last 3 Encounters:  05/28/20 117/64  04/02/20 (!) 92/44  02/27/20 (!) 148/77   Wt Readings from Last 3 Encounters:  05/28/20 137 lb (62.1 kg)  03/27/20 138 lb (62.6 kg)  02/27/20 141 lb (64 kg)     She was last seen for hypertension 3 months ago.  BP at that visit was 148/77. Management since that visit includes continue same medication. Patient was advised to check blood pressure a few times per week and call if consistently over 150.  She reports good compliance with treatment. She is not having side effects.  She is following a Regular diet. She is not exercising. She does not smoke.  Use of agents associated with hypertension: thyroid hormones.   Outside blood pressures are 118/60. Symptoms: No chest pain No chest pressure  No palpitations No syncope  No dyspnea No orthopnea  No paroxysmal nocturnal dyspnea No lower  extremity edema   Pertinent labs: Lab Results  Component Value Date   CHOL 173 07/01/2019   HDL 53 07/01/2019   LDLCALC 104 (H) 07/01/2019   TRIG 84 07/01/2019   CHOLHDL 3.3 07/01/2019   Lab Results  Component Value Date   NA 136 04/02/2020   K 4.1 04/02/2020   CREATININE 1.24 (H) 04/02/2020   GFRNONAA 41 (L) 04/02/2020   GFRAA 40 (L) 01/10/2020   GLUCOSE 93 04/02/2020     The ASCVD Risk score (Goff DC Jr., et al., 2013) failed to calculate for the following reasons:   The 2013 ASCVD risk score is only valid for ages 19 to 30   ---------------------------------------------------------------------------------------------------  Follow up for Iron deficiency anemia:  The patient was last seen for this 3 months ago. Changes made at last visit include none; continue iron supplement.  Since last visit patient was hospitalized and Iron supplement was discontinued.  She reports good compliance with treatment. She feels that condition is Unchanged.  She is not having side effects.   -----------------------------------------------------------------------------------------  Frequent falls: Patient's daughter reports that patient has fallen twice in the past 4 weeks. Her last fall was 2 weeks ago. Patient's daughter denies any loss of consciousness.       Medications: Outpatient Medications Prior to Visit  Medication Sig  . allopurinol (ZYLOPRIM) 100 MG tablet Take 2 tablets (200 mg total) by mouth daily.  Marland Kitchen amLODipine (NORVASC) 5 MG tablet Take 1 tablet (5 mg total) by mouth  daily.  . Calcium Carb-Cholecalciferol (CALCIUM 600 + D PO) Take 1 tablet by mouth daily.  Marland Kitchen docusate sodium (COLACE) 100 MG capsule Take 1 capsule (100 mg total) by mouth 2 (two) times daily. Hold if having loose or frequent BM's.  . levothyroxine (SYNTHROID) 88 MCG tablet Take 1 tablet (88 mcg total) by mouth daily before breakfast.  . Multiple Vitamins-Minerals (PRESERVISION AREDS 2 PO) Take 1 tablet by  mouth 2 (two) times daily.  . valsartan-hydrochlorothiazide (DIOVAN-HCT) 320-25 MG tablet TAKE ONE TABLET BY MOUTH EVERY DAY  . alum & mag hydroxide-simeth (MAALOX/MYLANTA) 200-200-20 MG/5ML suspension Take 30 mLs by mouth every 4 (four) hours as needed for indigestion. (Patient not taking: Reported on 05/28/2020)  . bisacodyl (DULCOLAX) 10 MG suppository Place 1 suppository (10 mg total) rectally daily as needed for moderate constipation. (Patient not taking: Reported on 05/28/2020)  . clotrimazole (CLOTRIMAZOLE ANTI-FUNGAL) 1 % cream Apply 1 application topically 2 (two) times daily. (Patient not taking: Reported on 05/28/2020)  . feeding supplement (ENSURE ENLIVE / ENSURE PLUS) LIQD Take 237 mLs by mouth 2 (two) times daily between meals. (Patient not taking: Reported on 05/28/2020)  . magnesium citrate SOLN Take 296 mLs (1 Bottle total) by mouth once as needed for severe constipation. (Patient not taking: Reported on 05/28/2020)  . methocarbamol (ROBAXIN) 500 MG tablet Take 1 tablet (500 mg total) by mouth every 8 (eight) hours as needed for muscle spasms. (Patient not taking: Reported on 05/28/2020)  . naphazoline-pheniramine (NAPHCON-A) 0.025-0.3 % ophthalmic solution Place 2 drops into the right eye 4 (four) times daily as needed for eye irritation. (Patient not taking: Reported on 05/28/2020)  . omeprazole (PRILOSEC) 20 MG capsule TAKE 1 CAPSULE BY MOUTH DAILY (Patient not taking: Reported on 05/28/2020)  . ondansetron (ZOFRAN) 4 MG tablet Take 1 tablet (4 mg total) by mouth every 6 (six) hours as needed for nausea. (Patient not taking: Reported on 05/28/2020)  . polyethylene glycol (MIRALAX / GLYCOLAX) 17 g packet Take 17 g by mouth daily as needed for mild constipation. (Patient not taking: Reported on 05/28/2020)  . [DISCONTINUED] calcium-vitamin D (OSCAL WITH D) 500-200 MG-UNIT tablet Take 1 tablet by mouth 2 (two) times daily. (Patient not taking: Reported on 05/28/2020)  . [DISCONTINUED] cephALEXin  (KEFLEX) 500 MG capsule Take 1 capsule (500 mg total) by mouth 2 (two) times daily.  . [DISCONTINUED] enoxaparin (LOVENOX) 30 MG/0.3ML injection Inject 0.3 mLs (30 mg total) into the skin daily for 14 days.   No facility-administered medications prior to visit.    Review of Systems  Constitutional: Negative for appetite change, chills, fatigue and fever.  Respiratory: Negative for chest tightness and shortness of breath.   Cardiovascular: Negative for chest pain and palpitations.  Gastrointestinal: Negative for abdominal pain, nausea and vomiting.  Musculoskeletal: Positive for gait problem.  Neurological: Positive for weakness. Negative for dizziness.       Objective    BP 117/64 (BP Location: Left Arm, Patient Position: Sitting, Cuff Size: Normal)   Pulse 84   Temp (!) 97.2 F (36.2 C) (Temporal)   Resp 18   Wt 137 lb (62.1 kg)   BMI 28.63 kg/m     Physical Exam    General: Appearance:     Well developed, well nourished female confined to wheelchair, in no acute distress  Eyes:    PERRL, conjunctiva/corneas clear, EOM's intact       Lungs:     Clear to auscultation bilaterally, respirations unlabored  Heart:  Normal heart rate. Normal rhythm. No murmurs, rubs, or gallops.   MS:   All extremities are intact.   Neurologic:   Awake, alert, oriented x 3. No apparent focal neurological           defect.         Assessment & Plan     1. Primary hypertension Has been consistently hypotensive several weeks. Reduce from '10mg'$  to - amLODipine (NORVASC) 2.5 MG tablet; Take 1 tablet (2.5 mg total) by mouth every evening.  Dispense: 90 tablet; Refill: 0 Reduced from 320/'25mg'$  to - valsartan-hydrochlorothiazide (DIOVAN-HCT) 320-12.5 MG tablet; Take 1 tablet by mouth daily.  Dispense: 90 tablet; Refill: 3  Recheck in about 12 weeks.   2. Hypothyroidism, unspecified type Will check tsh at follow up.   3. Iron deficiency anemia, unspecified iron deficiency anemia type Has been  off of iron supplements since hospitalized and send to rehab for wrist fracture. She is to start back on iron and will recheck at follow up in July.   Other orders - Calcium Carb-Cholecalciferol (CALCIUM 600 + D PO); Take 1 tablet by mouth daily.   No follow-ups on file.         [name], MD  [facility] [phone] (phone) [phone] (fax)  [place]

## 2020-05-28 NOTE — Patient Instructions (Addendum)
.   Please review the attached list of medications and notify my office if there are any errors.   . Go ahead and restart daily iron supplement

## 2020-06-19 ENCOUNTER — Other Ambulatory Visit: Payer: Self-pay

## 2020-06-19 ENCOUNTER — Ambulatory Visit: Payer: PPO | Admitting: Podiatry

## 2020-06-19 DIAGNOSIS — I83025 Varicose veins of left lower extremity with ulcer other part of foot: Secondary | ICD-10-CM

## 2020-06-19 DIAGNOSIS — L97529 Non-pressure chronic ulcer of other part of left foot with unspecified severity: Secondary | ICD-10-CM

## 2020-06-19 DIAGNOSIS — L97422 Non-pressure chronic ulcer of left heel and midfoot with fat layer exposed: Secondary | ICD-10-CM

## 2020-06-19 NOTE — Progress Notes (Signed)
   SUBJECTIVE Patient presents today with her daughter for follow-up evaluation of an ulcer that has developed to the posterior medial aspect of the left heel.  The ulcer has been present for approximately 8 weeks.  The nursing facility that she resides at has been applying Betadine with a light bandage.  They have also been offloading the posterior heel  Past Medical History:  Diagnosis Date  . Hyperlipidemia   . Hypertension   . Myocardial infarction [facility])     OBJECTIVE General Patient is awake, alert, and oriented x 3 and in no acute distress.  Derm  Ulcer noted to the left posterior medial heel measuring approximately 2.0 x 2.0 x 0.2 cm.  To the noted ulceration there is no eschar.  There is a minimal amount of slough fibrin and necrotic debris noted.  Granulation tissue and wound base is red.  There is no exposed bone muscle tendon ligament or joint.  No malodor noted.  Periwound intact.  Vasc  DP and PT pedal pulses palpable bilaterally. Temperature gradient within normal limits.   Neuro Epicritic and protective threshold sensation grossly intact bilaterally.   Musculoskeletal Exam No symptomatic pedal deformities noted bilateral. Muscular strength within normal limits.  Associated tenderness to palpation posterior heel left  ASSESSMENT 1. Onychodystrophic nails 1-5 bilateral with hyperkeratosis of nails.  2. Onychomycosis of nail due to dermatophyte bilateral 3.  Preulcerative callus lesions bilateral feet 4.  Ulcer left posterior medial heel with fat layer exposed  PLAN OF CARE 1. Patient evaluated today.  2. Instructed to maintain good pedal hygiene and foot care.  3. Medically necessary excisional debridement including subcutaneous tissue was performed using a tissue nipper.  Excisional debridement of all the necrotic nonviable tissue down to healthy bleeding viable tissue was performed with post debridement measurement same as pre-.   4.  Vascular ultrasound with TBI  ordered left lower extremity to establish vascular status 5.  Referral placed to the [facility] wound care center.  The patient has a established history with the wound care center and they are very satisfied there.  Their specialty and expertise will be greatly appreciated in helping to manage the wound 6.  Return to clinic as needed  Daughter is Genie.    Edrick Kins, DPM [facility]  Dr. Edrick Kins, DPM    [address], [place] [address]                Office [phone]  Fax [phone]

## 2020-06-20 ENCOUNTER — Other Ambulatory Visit: Payer: Self-pay | Admitting: Family Medicine

## 2020-06-20 DIAGNOSIS — I10 Essential (primary) hypertension: Secondary | ICD-10-CM

## 2020-06-20 NOTE — Telephone Encounter (Signed)
  Notes to clinic: Medication filled by a different provider Review for refill    Requested Prescriptions  Pending Prescriptions Disp Refills   allopurinol (ZYLOPRIM) 100 MG tablet [Pharmacy Med Name: ALLOPURINOL 100 MG TAB] 180 tablet     Sig: [place]      Endocrinology:  Gout Agents Failed - 06/20/2020 11:17 AM      Failed - Uric Acid in normal range and within 360 days    No results found for: POCURA, LABURIC        Failed - Cr in normal range and within 360 days    Creatinine, Ser  Date Value Ref Range Status  04/02/2020 1.24 (H) 0.44 - 1.00 mg/dL Final          Passed - Valid encounter within last 12 months    Recent Outpatient Visits           3 weeks ago Hypothyroidism, unspecified type   [facility] Birdie Sons, MD   3 months ago Iron deficiency anemia, unspecified iron deficiency anemia type   [facility] Birdie Sons, MD   5 months ago Iron deficiency anemia, unspecified iron deficiency anemia type   [facility] Birdie Sons, MD   9 months ago Iron deficiency anemia, unspecified iron deficiency anemia type   [facility] Birdie Sons, MD   9 months ago [place], Wendee Beavers, [place]       Future Appointments             In 2 months Caryn Section, Kirstie Peri, MD [facility], [facility]              Signed Prescriptions Disp Refills   omeprazole (PRILOSEC) 20 MG capsule 90 capsule 0    Sig: TAKE 1 CAPSULE BY MOUTH DAILY      Gastroenterology: Proton Pump Inhibitors Passed - 06/20/2020 11:17 AM      Passed - Valid encounter within last 12 months    Recent Outpatient Visits           3 weeks ago Hypothyroidism, unspecified type   [facility] Birdie Sons, MD   3 months ago Iron deficiency anemia, unspecified iron deficiency anemia type   [facility] Birdie Sons, MD   5 months ago Iron  deficiency anemia, unspecified iron deficiency anemia type   [facility] Birdie Sons, MD   9 months ago Iron deficiency anemia, unspecified iron deficiency anemia type   [facility] Birdie Sons, MD   9 months ago [place], Wendee Bea[place], [place]       Future Appointments             In 2 months Fisher, Kirstie Peri, MD [facility], [place]

## 2020-06-26 ENCOUNTER — Encounter: Payer: PPO | Attending: Physician Assistant | Admitting: Physician Assistant

## 2020-06-26 ENCOUNTER — Other Ambulatory Visit: Payer: Self-pay

## 2020-06-26 DIAGNOSIS — N183 Chronic kidney disease, stage 3 unspecified: Secondary | ICD-10-CM | POA: Diagnosis not present

## 2020-06-26 DIAGNOSIS — L980 Pyogenic granuloma: Secondary | ICD-10-CM | POA: Insufficient documentation

## 2020-06-26 DIAGNOSIS — L89623 Pressure ulcer of left heel, stage 3: Secondary | ICD-10-CM | POA: Diagnosis not present

## 2020-06-26 DIAGNOSIS — I129 Hypertensive chronic kidney disease with stage 1 through stage 4 chronic kidney disease, or unspecified chronic kidney disease: Secondary | ICD-10-CM | POA: Insufficient documentation

## 2020-06-26 DIAGNOSIS — L97519 Non-pressure chronic ulcer of other part of right foot with unspecified severity: Secondary | ICD-10-CM | POA: Insufficient documentation

## 2020-06-26 DIAGNOSIS — L89620 Pressure ulcer of left heel, unstageable: Secondary | ICD-10-CM | POA: Diagnosis not present

## 2020-06-27 ENCOUNTER — Ambulatory Visit (INDEPENDENT_AMBULATORY_CARE_PROVIDER_SITE_OTHER): Payer: PPO

## 2020-06-27 DIAGNOSIS — L97422 Non-pressure chronic ulcer of left heel and midfoot with fat layer exposed: Secondary | ICD-10-CM | POA: Diagnosis not present

## 2020-06-27 NOTE — Progress Notes (Signed)
Czaja, Bell L. ([id number]) Visit Report for 06/26/2020 Allergy List Details Patient Name: Mceachin, Mckinzy L. Date of Service: 06/26/2020 1:00 PM Medical Record Number: [id number] Patient Account Number: [id number] Date of Birth/Sex: [birth year] (87 y.o. F) Treating RN: Donnamarie Poag Primary Care Bryten Maher: [name] Other Clinician: Referring Jacobi Ryant: Daylene Katayama Treating Vedanth Sirico/Extender: Jeri Cos Weeks in Treatment: 0 Allergies Active Allergies simvastatin Severity: Mild atorvastatin Severity: Mild lovastatin Allergy Notes Electronic Signature(s) Signed: 06/27/2020 12:22:13 PM By: Donnamarie Poag Entered By: Donnamarie Poag on 06/26/2020 13:19:08 Towers, [place] ([id number]) -------------------------------------------------------------------------------- Arrival Information Details Patient Name: Whiston, Amerie L. Date of Service: 06/26/2020 1:00 PM Medical Record Number: [id number] Patient Account Number: [id number] Date of Birth/Sex: [birth year] (87 y.o. F) Treating RN: Donnamarie Poag Primary Care Allin Frix: [name] Other Clinician: Referring Julien Oscar: Daylene Katayama Treating Ceniyah Thorp/Extender: Skipper Cliche in Treatment: 0 Visit Information Patient Arrived: Wheel Chair Arrival Time: 13:10 Accompanied By: daughter Transfer Assistance: EasyPivot Patient Lift Patient Identification Verified: Yes Secondary Verification Process Completed: Yes Patient Has Alerts: Yes Patient Alerts: NOT diabetic History Since Last Visit Hospitalized since last visit: No Has Dressing in Place as Prescribed: Yes Electronic Signature(s) Signed: 06/27/2020 12:22:13 PM By: Donnamarie Poag Entered ByDonnamarie Poag on 06/26/2020 13:12:19 [place]ys[place]i, Julieana L. ([id number]) -------------------------------------------------------------------------------- Clinic Level of Care Assessment Details Patient Name: Monsour, Miranda L. Date of Service: 06/26/2020 1:00 PM Medical Record Number:  [id number] Patient Account Number: [id number] Date of Birth/Sex: 06-15-1926 (87 y.o. F) Treating RN: Dolan Amen Primary Care Kivon Aprea: [name] Other Clinician: Referring Aking Klabunde: Daylene Katayama Treating Burlon Centrella/Extender: Skipper Cliche in Treatment: 0 Clinic Level of Care Assessment Items TOOL 1 Quantity Score X - Use when EandM and Procedure is performed on INITIAL visit 1 0 ASSESSMENTS - Nursing Assessment / Reassessment X - General Physical Exam (combine w/ comprehensive assessment (listed just below) when performed on new 1 20 pt. evals) X- 1 25 Comprehensive Assessment (HX, ROS, Risk Assessments, Wounds Hx, etc.) ASSESSMENTS - Wound and Skin Assessment / Reassessment '[]'$  - Dermatologic / Skin Assessment (not related to wound area) 0 ASSESSMENTS - Ostomy and/or Continence Assessment and Care '[]'$  - Incontinence Assessment and Management 0 '[]'$  - 0 Ostomy Care Assessment and Management (repouching, etc.) PROCESS - Coordination of Care X - Simple Patient / Family Education for ongoing care 1 15 '[]'$  - 0 Complex (extensive) Patient / Family Education for ongoing care X- 1 10 Staff obtains Consents, Records, Test Results / Process Orders '[]'$  - 0 Staff telephones HHA, Nursing Homes / Clarify orders / etc '[]'$  - 0 Routine Transfer to another Facility (non-emergent condition) '[]'$  - 0 Routine Hospital Admission (non-emergent condition) '[]'$  - 0 New Admissions / Biomedical engineer / Ordering NPWT, Apligraf, etc. '[]'$  - 0 Emergency Hospital Admission (emergent condition) PROCESS - Special Needs '[]'$  - Pediatric / Minor Patient Management 0 '[]'$  - 0 Isolation Patient Management '[]'$  - 0 Hearing / Language / Visual special needs '[]'$  - 0 Assessment of Community assistance (transportation, D/C planning, etc.) '[]'$  - 0 Additional assistance / Altered mentation '[]'$  - 0 Support Surface(s) Assessment (bed, cushion, seat, etc.) INTERVENTIONS - Miscellaneous '[]'$  - External ear exam 0 '[]'$  -  0 Patient Transfer (multiple staff / Civil Service fast streamer / Similar devices) '[]'$  - 0 Simple Staple / Suture removal (25 or less) '[]'$  - 0 Complex Staple / Suture removal (26 or more) '[]'$  - 0 Hypo/Hyperglycemic Management (do not check if billed separately) X- 1 15 Ankle / Brachial Index (ABI) - do not check if billed separately  Has the patient been seen at the hospital within the last three years: Yes Total Score: 85 Level Of Care: New/Established - Level 3 Vannest, Kourtnee L. ([id number]) Electronic Signature(s) Signed: 06/26/2020 3:56:49 PM By: Georges Mouse, Minus Breeding RN Entered By: Georges Mouse, Minus Breeding on 06/26/2020 14:06:01 Dysart, Yasmeen L. ([id number]) -------------------------------------------------------------------------------- Lower Extremity Assessment Details Patient Name: Booher, Estell L. Date of Service: 06/26/2020 1:00 PM Medical Record Number: [id number] Patient Account Number: [id number] Date of Birth/Sex: 29-Feb-1926 (87 y.o. F) Treating RN: Donnamarie Poag Primary Care Aldrick Derrig: [name] Other Clinician: Referring Kamrin Spath: Daylene Katayama Treating Kabe Mckoy/Extender: Skipper Cliche in Treatment: 0 Edema Assessment Assessed: [Left: Yes] [Right: No] Edema: [Left: Ye] [Right: s] Calf Left: Right: Point of Measurement: 32 cm From Medial Instep 35.4 cm Ankle Left: Right: Point of Measurement: 12 cm From Medial Instep 27 cm Knee To Floor Left: Right: From Medial Instep 39 cm Vascular Assessment Pulses: Dorsalis Pedis Palpable: [Left:Yes] Blood Pressure: Brachial: [Left:120] Ankle: [Left:Dorsalis Pedis: 138 1.15] Electronic Signature(s) Signed: 06/27/2020 12:22:13 PM By: Donnamarie Poag Entered ByDonnamarie Poag on 06/26/2020 13:34:14 Levitt, Triva L. ([id number]) -------------------------------------------------------------------------------- Multi Wound Chart Details Patient Name: Perko, Sameen L. Date of Service: 06/26/2020 1:00 PM Medical Record Number:  [id number] Patient Account Number: [id number] Date of Birth/Sex: Jul 03, 1926 (87 y.o. F) Treating RN: Dolan Amen Primary Care Keren Alverio: [name] Other Clinician: Referring Mackenzi Krogh: Daylene Katayama Treating Makenlee Mckeag/Extender: Skipper Cliche in Treatment: 0 Vital Signs Height(in): 58 Pulse(bpm): 80 Weight(lbs): 137 Blood Pressure(mmHg): 134/75 Body Mass Index(BMI): 29 Temperature(F): 97.8 Respiratory Rate(breaths/min): 18 Photos: [N/A:N/A] Wound Location: Left, Medial Calcaneus N/A N/A Wounding Event: Gradually Appeared N/A N/A Primary Etiology: Pressure Ulcer N/A N/A Comorbid History: Cataracts, Anemia, Coronary Artery N/A N/A Disease, Hypertension, Myocardial Infarction, End Stage Renal Disease, Gout, Osteoarthritis, Dementia Date Acquired: 04/11/2020 N/A N/A Weeks of Treatment: 0 N/A N/A Wound Status: Open N/A N/A Measurements L x W x D (cm) 1x1.5x0.4 N/A N/A Area (cm) : 1.178 N/A N/A Volume (cm) : 0.471 N/A N/A Classification: Unstageable/Unclassified N/A N/A Exudate Amount: None Present N/A N/A Granulation Amount: Small (1-33%) N/A N/A Granulation Quality: Pink N/A N/A Necrotic Amount: Large (67-100%) N/A N/A Exposed Structures: Fat Layer (Subcutaneous Tissue): N/A N/A Yes Fascia: No Tendon: No Muscle: No Joint: No Bone: No Treatment Notes Electronic Signature(s) Signed: 06/26/2020 3:56:49 PM By: Georges Mouse, Minus Breeding RN Entered By: Georges Mouse, Minus Breeding on 06/26/2020 13:57:04 Perra, Feven L. ([id number]) -------------------------------------------------------------------------------- [place] Details Patient Name: Geiselman, Stephenia L. Date of Service: 06/26/2020 1:00 PM Medical Record Number: [id number] Patient Account Number: [id number] Date of Birth/Sex: 05-27-1926 (87 y.o. F) Treating RN: Dolan Amen Primary Care Jaysin Gayler: [name] Other Clinician: Referring Andre Swander: Daylene Katayama Treating Nettye Flegal/Extender: Skipper Cliche in Treatment: 0 Active Inactive Orientation to the Wound Care Program Nursing Diagnoses: Knowledge deficit related to the wound healing center program Goals: Patient/caregiver will verbalize understanding of the [place] Program Date Initiated: 06/26/2020 Target Resolution Date: 06/26/2020 Goal Status: Active Interventions: Provide education on orientation to the wound center Notes: Wound/Skin Impairment Nursing Diagnoses: Impaired tissue integrity Goals: Patient/caregiver will verbalize understanding of skin care regimen Date Initiated: 06/26/2020 Target Resolution Date: 06/26/2020 Goal Status: Active Ulcer/skin breakdown will have a volume reduction of 30% by week 4 Date Initiated: 06/26/2020 Target Resolution Date: 07/27/2020 Goal Status: Active Ulcer/skin breakdown will have a volume reduction of 50% by week 8 Date Initiated: 06/26/2020 Target Resolution Date: 08/26/2020 Goal Status: Active Ulcer/skin breakdown will have a volume reduction of 80% by week 12  Date Initiated: 06/26/2020 Target Resolution Date: 09/26/2020 Goal Status: Active Ulcer/skin breakdown will heal within 14 weeks Date Initiated: 06/26/2020 Target Resolution Date: 10/27/2020 Goal Status: Active Interventions: Assess patient/caregiver ability to obtain necessary supplies Assess patient/caregiver ability to perform ulcer/skin care regimen upon admission and as needed Assess ulceration(s) every visit Provide education on ulcer and skin care Treatment Activities: Referred to DME Amyr Sluder for dressing supplies : 06/26/2020 Skin care regimen initiated : 06/26/2020 Notes: Electronic Signature(s) Signed: 06/26/2020 3:56:49 PM By: Georges Mouse, Minus Breeding RN Entered By: Georges Mouse, Minus Breeding on 06/26/2020 13:56:48 Waage, Deannah L. ([id number]) Wickizer, Mellody L. ([id number]) -------------------------------------------------------------------------------- Pain Assessment Details Patient Name:  Blaustein, Hazelgrace L. Date of Service: 06/26/2020 1:00 PM Medical Record Number: [id number] Patient Account Number: [id number] Date of Birth/Sex: 1926/10/08 (87 y.o. F) Treating RN: Donnamarie Poag Primary Care Lilburn Straw: [name] Other Clinician: Referring Sada Mazzoni: Daylene Katayama Treating Nianna Igo/Extender: Skipper Cliche in Treatment: 0 Active Problems Location of Pain Severity and Description of Pain Patient Has Paino No Site Locations Rate the pain. Current Pain Level: 0 Pain Management and Medication Current Pain Management: Electronic Signature(s) Signed: 06/27/2020 12:22:13 PM By: Donnamarie Poag Entered By: Donnamarie Poag on 06/26/2020 13:12:31 Lavery, Alzena L. ([id number]) -------------------------------------------------------------------------------- Patient/Caregiver Education Details Patient Name: Chauvin, Kella L. Date of Service: 06/26/2020 1:00 PM Medical Record Number: [id number] Patient Account Number: [id number] Date of Birth/Gender: 30-Jul-1926 (87 y.o. F) Treating RN: Dolan Amen Primary Care Physician: [name] Other Clinician: Referring Physician: Daylene Katayama Treating Physician/Extender: Skipper Cliche in Treatment: 0 Education Assessment Education Provided To: Patient Education Topics Provided Welcome To [facility]: Methods: Explain/Verbal Responses: State content correctly Wound/Skin Impairment: Methods: Explain/Verbal Responses: State content correctly Electronic Signature(s) Signed: 06/26/2020 3:56:49 PM By: Georges Mouse, Minus Breeding RN Entered By: Georges Mouse, Minus Breeding on 06/26/2020 14:06:23 Seaberg, Miasha L. ([id number]) -------------------------------------------------------------------------------- Wound Assessment Details Patient Name: Deharo, Marcel L. Date of Service: 06/26/2020 1:00 PM Medical Record Number: [id number] Patient Account Number: [id number] Date of Birth/Sex: 12-01-1926 (87 y.o. F) Treating RN: Donnamarie Poag Primary Care Taralynn Quiett: [name] Other Clinician: Referring Chattie Greeson: Daylene Katayama Treating Caly Pellum/Extender: Skipper Cliche in Treatment: 0 Wound Status Wound Number: 2 Primary Pressure Ulcer Etiology: Wound Location: Left, Medial Calcaneus Wound Open Wounding Event: Pressure Injury Status: Date Acquired: 04/11/2020 Comorbid Cataracts, Anemia, Coronary Artery Disease, Weeks Of Treatment: 0 History: Hypertension, Myocardial Infarction, End Stage Renal Clustered Wound: No Disease, Gout, Osteoarthritis, Dementia Photos Wound Measurements Length: (cm) 1 Width: (cm) 1.5 Depth: (cm) 0.4 Area: (cm) 1.178 Volume: (cm) 0.471 % Reduction in Area: 0% % Reduction in Volume: 0% Tunneling: No Undermining: No Wound Description Classification: Unstageable/Unclassified Exudate Amount: Medium Exudate Type: Serosanguineous Exudate Color: red, brown Foul Odor After Cleansing: No Slough/Fibrino Yes Wound Bed Granulation Amount: Small (1-33%) Exposed Structure Granulation Quality: Pink Fascia Exposed: No Necrotic Amount: Large (67-100%) Fat Layer (Subcutaneous Tissue) Exposed: Yes Necrotic Quality: Adherent Slough Tendon Exposed: No Muscle Exposed: No Joint Exposed: No Bone Exposed: No Electronic Signature(s) Signed: 06/26/2020 3:56:49 PM By: Georges Mouse, Minus Breeding RN Signed: 06/27/2020 12:22:13 PM By: Donnamarie Poag Entered By: Georges Mouse, Minus Breeding on 06/26/2020 14:04:52 Brueggemann, Kanon L. ([id number]) -------------------------------------------------------------------------------- Vitals Details Patient Name: Iles, Laurieann L. Date of Service: 06/26/2020 1:00 PM Medical Record Number: [id number] Patient Account Number: [id number] Date of Birth/Sex: [birth year] (87 y.o. F) Treating RN: Donnamarie Poag Primary Care Helga Asbury: [name] Other Clinician: Referring Jayde Daffin: Daylene Katayama Treating Keya Wynes/Extender: Skipper Cliche in Treatment: 0 Vital Signs Time  Taken: 13:14 Temperature (F): 97.8 Height (  in): 58 Pulse (bpm): 80 Weight (lbs): 137 Respiratory Rate (breaths/min): 18 Body Mass Index (BMI): 28.6 Blood Pressure (mmHg): 134/75 Reference Range: 80 - 120 mg / dl Electronic Signature(s) Signed: 06/27/2020 12:22:13 PM By: Donnamarie Poag Entered ByDonnamarie Poag on 06/26/2020 13:15:46

## 2020-06-27 NOTE — Progress Notes (Signed)
Eplin, Cathyrn L. ([id number]) Visit Report for 06/26/2020 Chief Complaint Document Details Patient Name: [name] [name], [name] Date of Service: 06/26/2020 1:00 PM Medical Record Number: [id number] Patient Account Number: [id number] Date of Birth/Sex: 18-Jun-1926 (87 y.o. F) Treating RN: Dolan Amen Primary Care Provider: [name] Other Clinician: Referring Provider: Daylene Katayama Treating Provider/Extender: Skipper Cliche in Treatment: 0 Information Obtained from: Patient Chief Complaint Left Heel Pressure Ulcer Electronic Signature(s) Signed: 06/26/2020 1:53:42 PM By: Worthy Keeler PA-C Entered By: Worthy Keeler on 06/26/2020 13:53:42 Hommel, Azile L. ([id number]) -------------------------------------------------------------------------------- Debridement Details Patient Name: Knoll, Pamula L. Date of Service: 06/26/2020 1:00 PM Medical Record Number: [id number] Patient Account Number: [id number] Date of Birth/Sex: [birth year] (87 y.o. F) Treating RN: Dolan Amen Primary Care Provider: [name] Other Clinician: Referring Provider: Daylene Katayama Treating Provider/Extender: Skipper Cliche in Treatment: 0 Debridement Performed for Wound #2 Left,Medial Calcaneus Assessment: Performed By: Physician Tommie Sams., PA-C Debridement Type: Debridement Level of Consciousness (Pre- Awake and Alert procedure): Pre-procedure Verification/Time Out Yes - 13:57 Taken: Start Time: 13:57 Total Area Debrided (L x W): 1 (cm) x 1.5 (cm) = 1.5 (cm) Tissue and other material Non-Viable, Callus, Slough, Subcutaneous, Slough debrided: Level: Skin/Subcutaneous Tissue Debridement Description: Excisional Instrument: Curette Bleeding: Minimum Hemostasis Achieved: Pressure Response to Treatment: Procedure was tolerated well Level of Consciousness (Post- Awake and Alert procedure): Post Debridement Measurements of Total Wound Length: (cm) 1 Stage:  Unstageable/Unclassified Width: (cm) 1.5 Depth: (cm) 0.5 Volume: (cm) 0.589 Character of Wound/Ulcer Post Debridement: Stable Post Procedure Diagnosis Same as Pre-procedure Electronic Signature(s) Signed: 06/26/2020 3:56:49 PM By: Charlett Nose RN Signed: 06/26/2020 5:02:57 PM By: Worthy Keeler PA-C Entered By: Georges Mouse, Minus Breeding on 06/26/2020 14:01:01 Weidler, Ellakate L. ([id number]) -------------------------------------------------------------------------------- HPI Details Patient Name: Fogg, [name] Date of Service: 06/26/2020 1:00 PM Medical Record Number: [id number] Patient Account Number: [id number] Date of Birth/Sex: 08-Jan-1927 (87 y.o. F) Treating RN: Dolan Amen Primary Care Provider: [name] Other Clinician: Referring Provider: Daylene Katayama Treating Provider/Extender: Skipper Cliche in Treatment: 0 History of Present Illness HPI Description: 87 year old patient was recently seen by the PCPs office for significant pain right great toe which has been going on since July. She was initially treated with Keflex which she did not complete and after the office visit this time she has been put on doxycycline. at the time of her visit she was found to have a ulcer on the plantar surface of the right great toe and also had a pyogenic granuloma over this area. X-ray of the right foot done 12/29/2014 -- IMPRESSION:Soft-tissue swelling and ulceration right great toe. No underlying bony lytic lesion identified. If osteomyelitis remains of clinical concern MRI can be obtained. Past medical history significant for anemia, chronic kidney disease stage III, obesity, varicose veins, coronary artery disease, gout, history of nicotine addiction given up smoking in 2002, hypertension, status post cardiac catheterization, status post abdominal hysterectomy, cholecystectomy and tonsillectomy. hemoglobin A1c done in August was 5.8 01/22/2015 -- at this stage the [facility]  Walking boat was going to cost them significant amount of money and they want to defer using that at the present time. 01/29/2015 -- she had a podiatry appointment and they have trimmed her toenails. She has not heard back from the vascular office regarding her venous duplex study and I have asked them to call personally so that they can get the appointment soon. 02/06/2015 -- they have made contact with the vascular office and from what I understand that  test has been done but the report is pending. 02/19/2014 -- the vascular test is scheduled for tomorrow Readmission: 06/26/2020 upon evaluation today patient appears for initial evaluation in her clinic that she has been here before in 2016 and has been quite sometime. She did have a fractured hip in February on the 23rd 2022. Subsequently this had to be pinned and she ended up with a pressure injury on her heel following when she was using her foot to help move her around in the bed. Subsequently this has led to the wound that she has been dealing with since that time. She lives at home with her daughter currently she does have dementia. 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 Electronic Signature(s) Signed: 06/26/2020 2:25:45 PM By: Worthy Keeler PA-C Entered By: Worthy Keeler on 06/26/2020 14:25:44 Eveleth, Shaquia L. ([id number]) -------------------------------------------------------------------------------- Physical Exam Details Patient Name: Jacques, Melessia L. Date of Service: 06/26/2020 1:00 PM Medical Record Number: [id number] Patient Account Number: [id number] Date of Birth/Sex: 26-Feb-1926 (87 y.o. F) Treating RN: Dolan Amen Primary Care Provider: [name] Other Clinician: Referring Provider: Daylene Katayama Treating Provider/Extender: Skipper Cliche in Treatment:  0 Constitutional sitting or standing blood pressure is within target range for patient.. pulse regular and within target range for patient.Marland Kitchen respirations regular, non- labored and within target range for patient.Marland Kitchen temperature within target range for patient.. Well-nourished and well-hydrated in no acute distress. Eyes conjunctiva clear no eyelid edema noted. pupils equal round and reactive to light and accommodation. Ears, Nose, Mouth, and Throat no gross abnormality of ear auricles or external auditory canals. normal hearing noted during conversation. mucus membranes moist. Respiratory normal breathing without difficulty. Cardiovascular 2+ dorsalis pedis/posterior tibialis pulses. 1+ pitting edema of the bilateral lower extremities. Musculoskeletal Patient unable to walk without assistance. no significant deformity or arthritic changes, no loss or range of motion, no clubbing. Psychiatric Patient is not able to cooperate in decision making regarding care. Patient has dementia. pleasant and cooperative. Notes Upon inspection patient's wound bed did require sharp debridement to clear away some of the necrotic debris currently. The patient tolerated this without complication and postdebridement the wound bed appears to be doing much better which is great news. No fevers, chills, nausea, vomiting, or diarrhea. Electronic Signature(s) Signed: 06/26/2020 2:26:59 PM By: Worthy Keeler PA-C Entered By: Worthy Keeler on 06/26/2020 14:26:59 Perrow, Alexandra L. ([id number]) -------------------------------------------------------------------------------- Physician Orders Details Patient Name: Cottrill, Malik L. Date of Service: 06/26/2020 1:00 PM Medical Record Number: [id number] Patient Account Number: [id number] Date of Birth/Sex: 1927/01/18 (87 y.o. F) Treating RN: Dolan Amen Primary Care Provider: [name] Other Clinician: Referring Provider: Daylene Katayama Treating Provider/Extender:  Skipper Cliche in Treatment: 0 Verbal / Phone Orders: No Diagnosis Coding ICD-10 Coding Code Description [phone] Pressure ulcer of left heel, unstageable I10 Essential (primary) hypertension F01.50 Vascular dementia without behavioral disturbance I25.10 Atherosclerotic heart disease of native coronary artery without angina pectoris Follow-up Appointments o Return Appointment in 1 week. Bathing/ Shower/ Hygiene o May shower; gently cleanse wound with antibacterial soap, rinse and pat dry prior to dressing wounds Wound Treatment Wound #2 - Calcaneus Wound Laterality: Left, Medial Cleanser: Byram Ancillary Kit - 15 Day Supply (DME) (Generic) 3 x Per Week/30 Days Discharge Instructions: Use supplies as instructed; Kit contains: (15) Saline Bullets; (15) 3x3 Gauze; 15 pr Gloves Cleanser: Normal Saline 3 x Per Week/30 Days  Discharge Instructions: Wash your hands with soap and water. Remove old dressing, discard into plastic bag and place into trash. Cleanse the wound with Normal Saline prior to applying a clean dressing using gauze sponges, not tissues or cotton balls. Do not scrub or use excessive force. Pat dry using gauze sponges, not tissue or cotton balls. Primary Dressing: Iodosorb 40 (g) (DME) (Generic) 3 x Per Week/30 Days Discharge Instructions: Apply IodoSorb to wound bed only as directed. Secondary Dressing: Mepilex Border Flex, 4x4 (in/in) (DME) (Generic) 3 x Per Week/30 Days Discharge Instructions: Apply to wound as directed. Do not cut. Electronic Signature(s) Signed: 06/26/2020 3:56:49 PM By: Georges Mouse, Minus Breeding RN Signed: 06/26/2020 5:02:57 PM By: Worthy Keeler PA-C Entered By: Georges Mouse, Minus Breeding on 06/26/2020 14:05:30 Blase, Sary L. ([id number]) -------------------------------------------------------------------------------- Problem List Details Patient Name: Whistler, Hamda L. Date of Service: 06/26/2020 1:00 PM Medical Record Number: [id number] Patient  Account Number: [id number] Date of Birth/Sex: 1926/02/27 (87 y.o. F) Treating RN: Dolan Amen Primary Care Provider: [name] Other Clinician: Referring Provider: Daylene Katayama Treating Provider/Extender: Jeri Cos Weeks in Treatment: 0 Active Problems ICD-10 Encounter Code Description Active Date MDM Diagnosis L89.620 Pressure ulcer of left heel, unstageable 06/26/2020 No Yes I10 Essential (primary) hypertension 06/26/2020 No Yes F01.50 Vascular dementia without behavioral disturbance 06/26/2020 No Yes I25.10 Atherosclerotic heart disease of native coronary artery without angina 06/26/2020 No Yes pectoris Inactive Problems Resolved Problems Electronic Signature(s) Signed: 06/26/2020 1:52:56 PM By: Worthy Keeler PA-C Entered By: Worthy Keeler on 06/26/2020 13:52:56 Carlberg, Dariann L. ([id number]) -------------------------------------------------------------------------------- Progress Note Details Patient Name: Goodner, Dhwani L. Date of Service: 06/26/2020 1:00 PM Medical Record Number: [id number] Patient Account Number: [id number] Date of Birth/Sex: 28-Sep-1926 (87 y.o. F) Treating RN: Dolan Amen Primary Care Provider: [name] Other Clinician: Referring Provider: Daylene Katayama Treating Provider/Extender: Skipper Cliche in Treatment: 0 Subjective Chief Complaint Information obtained from Patient Left Heel Pressure Ulcer History of Present Illness (HPI) 87 year old patient was recently seen by the PCPs office for significant pain right great toe which has been going on since July. She was initially treated with Keflex which she did not complete and after the office visit this time she has been put on doxycycline. at the time of her visit she was found to have a ulcer on the plantar surface of the right great toe and also had a pyogenic granuloma over this area. X-ray of the right foot done 12/29/2014 -- IMPRESSION:Soft-tissue swelling and ulceration right  great toe. No underlying bony lytic lesion identified. If osteomyelitis remains of clinical concern MRI can be obtained. Past medical history significant for anemia, chronic kidney disease stage III, obesity, varicose veins, coronary artery disease, gout, history of nicotine addiction given up smoking in 2002, hypertension, status post cardiac catheterization, status post abdominal hysterectomy, cholecystectomy and tonsillectomy. hemoglobin A1c done in August was 5.8 01/22/2015 -- at this stage the [facility] Walking boat was going to cost them significant amount of money and they want to defer using that at the present time. 01/29/2015 -- she had a podiatry appointment and they have trimmed her toenails. She has not heard back from the vascular office regarding her venous duplex study and I have asked them to call personally so that they can get the appointment soon. 02/06/2015 -- they have made contact with the vascular office and from what I understand that test has been done but the report is pending. 02/19/2014 -- the vascular test is scheduled for tomorrow Readmission: 06/26/2020 upon evaluation  today patient appears for initial evaluation in her clinic that she has been here before in 2016 and has been quite sometime. She did have a fractured hip in February on the 23rd 2022. Subsequently this had to be pinned and she ended up with a pressure injury on her heel following when she was using her foot to help move her around in the bed. Subsequently this has led to the wound that she has been dealing with since that time. She lives at home with her daughter currently she does have dementia. 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 Patient History Unable to Obtain Patient History due to Altered Mental Status. Allergies simvastatin (Severity: Mild), atorvastatin  (Severity: Mild), lovastatin Family History Cancer - Child,Mother, Heart Disease - Siblings, Hypertension - Siblings,Child, No family history of Diabetes, Hereditary Spherocytosis, Kidney Disease, Lung Disease, Seizures, Stroke, Thyroid Problems, Tuberculosis. Social History Former smoker - quit sine 2003, Marital Status - Widowed, Alcohol Use - Never, Drug Use - No History, Caffeine Use - Daily. Medical History Eyes Patient has history of Cataracts Ear/Nose/Mouth/Throat Denies history of Chronic sinus problems/congestion, Middle ear problems Hematologic/Lymphatic Patient has history of Anemia Denies history of Hemophilia, Human Immunodeficiency Virus, Lymphedema, Sickle Cell Disease Respiratory Denies history of Aspiration, Asthma, Chronic Obstructive Pulmonary Disease (COPD), Pneumothorax, Sleep Apnea, Tuberculosis Cardiovascular Patient has history of Coronary Artery Disease, Hypertension, Myocardial Infarction - hx Gastrointestinal Denies history of Cirrhosis , Colitis, Crohn s, Hepatitis A, Hepatitis B, Hepatitis C Endocrine Denies history of Type I Diabetes, Type II Diabetes Genitourinary Patient has history of End Stage Renal Disease Ingalsbe, Philena L. ([id number]) Immunological Denies history of Lupus Erythematosus, Raynaud s, Scleroderma Integumentary (Skin) Denies history of History of Burn, History of pressure wounds Musculoskeletal Patient has history of Gout, Osteoarthritis Neurologic Patient has history of Dementia - symptoms reported by POA Denies history of Neuropathy, Quadriplegia, Paraplegia, Seizure Disorder Oncologic Denies history of Received Chemotherapy, Received Radiation Hospitalization/Surgery History - R hip pin. - chole. - hyster. Medical And Surgical History Notes Cardiovascular hypothyroidsm, high cholesterol Review of Systems (ROS) Constitutional Symptoms (General Health) Denies complaints or symptoms of Fatigue, Fever, Chills, Marked Weight  Change. Eyes Complains or has symptoms of Vision Changes - macular degeneration, R eye legally blind Ear/Nose/Mouth/Throat Complains or has symptoms of Difficult clearing ears, HOH Respiratory Complains or has symptoms of Shortness of Breath. Cardiovascular hx heart cath/stents Gastrointestinal GERD Endocrine Complains or has symptoms of Thyroid disease - hypo. Genitourinary Complains or has symptoms of Incontinence/dribbling. Immunological Denies complaints or symptoms of Hives, Itching. Integumentary (Skin) Complains or has symptoms of Wounds - hx, Swelling, hx cellulitis Musculoskeletal Complains or has symptoms of Muscle Pain. Psychiatric Denies complaints or symptoms of Anxiety, Claustrophobia. Objective Constitutional sitting or standing blood pressure is within target range for patient.. pulse regular and within target range for patient.Marland Kitchen respirations regular, non- labored and within target range for patient.Marland Kitchen temperature within target range for patient.. Well-nourished and well-hydrated in no acute distress. Vitals Time Taken: 1:14 PM, Height: 58 in, Weight: 137 lbs, BMI: 28.6, Temperature: 97.8 F, Pulse: 80 bpm, Respiratory Rate: 18 breaths/min, Blood Pressure: 134/75 mmHg. Eyes conjunctiva clear no eyelid edema noted. pupils equal round and reactive to light and accommodation. Ears, Nose, Mouth, and Throat no gross abnormality of ear auricles or external auditory canals. normal hearing noted during conversation. mucus membranes moist. Respiratory normal breathing without difficulty. Cardiovascular 2+  dorsalis pedis/posterior tibialis pulses. 1+ pitting edema of the bilateral lower extremities. Musculoskeletal Patient unable to walk without assistance. no significant deformity or arthritic changes, no loss or range of motion, no clubbing. Psychiatric Anna, Paizley L. ([id number]) Patient is not able to cooperate in decision making regarding care. Patient has  dementia. pleasant and cooperative. General Notes: Upon inspection patient's wound bed did require sharp debridement to clear away some of the necrotic debris currently. The patient tolerated this without complication and postdebridement the wound bed appears to be doing much better which is great news. No fevers, chills, nausea, vomiting, or diarrhea. Integumentary (Hair, Skin) Wound #2 status is Open. Original cause of wound was Pressure Injury. The date acquired was: 04/11/2020. The wound is located on the Left,Medial Calcaneus. The wound measures 1cm length x 1.5cm width x 0.4cm depth; 1.178cm^2 area and 0.471cm^3 volume. There is Fat Layer (Subcutaneous Tissue) exposed. There is no tunneling or undermining noted. There is a medium amount of serosanguineous drainage noted. There is small (1-33%) pink granulation within the wound bed. There is a large (67-100%) amount of necrotic tissue within the wound bed including Adherent Slough. Assessment Active Problems ICD-10 Pressure ulcer of left heel, unstageable Essential (primary) hypertension Vascular dementia without behavioral disturbance Atherosclerotic heart disease of native coronary artery without angina pectoris Procedures Wound #2 Pre-procedure diagnosis of Wound #2 is a Pressure Ulcer located on the Left,Medial Calcaneus . There was a Excisional Skin/Subcutaneous Tissue Debridement with a total area of 1.5 sq cm performed by Tommie Sams., PA-C. With the following instrument(s): Curette to remove Non-Viable tissue/material. Material removed includes Callus, Subcutaneous Tissue, and Slough. A time out was conducted at 13:57, prior to the start of the procedure. A Minimum amount of bleeding was controlled with Pressure. The procedure was tolerated well. Post Debridement Measurements: 1cm length x 1.5cm width x 0.5cm depth; 0.589cm^3 volume. Post debridement Stage noted as Unstageable/Unclassified. Character of Wound/Ulcer Post  Debridement is stable. Post procedure Diagnosis Wound #2: Same as Pre-Procedure Plan Follow-up Appointments: Return Appointment in 1 week. Bathing/ Shower/ Hygiene: May shower; gently cleanse wound with antibacterial soap, rinse and pat dry prior to dressing wounds WOUND #2: - Calcaneus Wound Laterality: Left, Medial Cleanser: Byram Ancillary Kit - 15 Day Supply (DME) (Generic) 3 x Per Week/30 Days Discharge Instructions: Use supplies as instructed; Kit contains: (15) Saline Bullets; (15) 3x3 Gauze; 15 pr Gloves Cleanser: Normal Saline 3 x Per Week/30 Days Discharge Instructions: Wash your hands with soap and water. Remove old dressing, discard into plastic bag and place into trash. Cleanse the wound with Normal Saline prior to applying a clean dressing using gauze sponges, not tissues or cotton balls. Do not scrub or use excessive force. Pat dry using gauze sponges, not tissue or cotton balls. Primary Dressing: Iodosorb 40 (g) (DME) (Generic) 3 x Per Week/30 Days Discharge Instructions: Apply IodoSorb to wound bed only as directed. Secondary Dressing: Mepilex Border Flex, 4x4 (in/in) (DME) (Generic) 3 x Per Week/30 Days Discharge Instructions: Apply to wound as directed. Do not cut. 1. Would recommend currently that going continue with the wound care measures as before and the patient is in agreement with the plan this is including the use of the Iodoflex/Iodosorb which I think will do a great job for her. Dr. Amalia Hailey who referred her to [place] actually has been using this intermittently in the office I think a more sustained use of this will be much better for her. The patient and her daughter are in  agreement with plan. 2. I am also can recommend the patient continue with keeping pressure off of this area I think that is of utmost importance although right now I Sammon, Shadell L. ([id number]) do not see any obvious signs of recent pressure injury. 3. I am also can recommend a border foam  dressing to help protect the heel to some degree providing some cushioning as well. We will see patient back for reevaluation in 1 week here in the clinic. If anything worsens or changes patient will contact our office for additional recommendations. Electronic Signature(s) Signed: 06/26/2020 2:27:40 PM By: Worthy Keeler PA-C Entered By: Worthy Keeler on 06/26/2020 14:27:40 Bobb, Eryn L. ([id number]) -------------------------------------------------------------------------------- ROS/PFSH Details Patient Name: Dreibelbis, Lokelani L. Date of Service: 06/26/2020 1:00 PM Medical Record Number: [id number] Patient Account Number: [id number] Date of Birth/Sex: Mar 22, 1926 (87 y.o. F) Treating RN: Donnamarie Poag Primary Care Provider: [name] Other Clinician: Referring Provider: Daylene Katayama Treating Provider/Extender: Skipper Cliche in Treatment: 0 Unable to Obtain Patient History due to oo Altered Mental Status Constitutional Symptoms (General Health) Complaints and Symptoms: Negative for: Fatigue; Fever; Chills; Marked Weight Change Eyes Complaints and Symptoms: Positive for: Vision Changes - macular degeneration Review of System Notes: R eye legally blind Medical History: Positive for: Cataracts Ear/Nose/Mouth/Throat Complaints and Symptoms: Positive for: Difficult clearing ears Review of System Notes: HOH Medical History: Negative for: Chronic sinus problems/congestion; Middle ear problems Respiratory Complaints and Symptoms: Positive for: Shortness of Breath Medical History: Negative for: Aspiration; Asthma; Chronic Obstructive Pulmonary Disease (COPD); Pneumothorax; Sleep Apnea; Tuberculosis Endocrine Complaints and Symptoms: Positive for: Thyroid disease - hypo Medical History: Negative for: Type I Diabetes; Type II Diabetes Genitourinary Complaints and Symptoms: Positive for: Incontinence/dribbling Medical History: Positive for: End Stage Renal  Disease Immunological Complaints and Symptoms: Negative for: Hives; Itching Medical History: Negative for: Lupus Erythematosus; Raynaudos; Scleroderma Integumentary (Skin) Lanz, Tahani L. ([id number]) Complaints and Symptoms: Positive for: Wounds - hx; Swelling Review of System Notes: hx cellulitis Medical History: Negative for: History of Burn; History of pressure wounds Musculoskeletal Complaints and Symptoms: Positive for: Muscle Pain Medical History: Positive for: Gout; Osteoarthritis Psychiatric Complaints and Symptoms: Negative for: Anxiety; Claustrophobia Hematologic/Lymphatic Medical History: Positive for: Anemia Negative for: Hemophilia; Human Immunodeficiency Virus; Lymphedema; Sickle Cell Disease Cardiovascular Complaints and Symptoms: Review of System Notes: hx heart cath/stents Medical History: Positive for: Coronary Artery Disease; Hypertension; Myocardial Infarction - hx Past Medical History Notes: hypothyroidsm, high cholesterol Gastrointestinal Complaints and Symptoms: Review of System Notes: GERD Medical History: Negative for: Cirrhosis ; Colitis; Crohnos; Hepatitis A; Hepatitis B; Hepatitis C Neurologic Medical History: Positive for: Dementia - symptoms reported by POA Negative for: Neuropathy; Quadriplegia; Paraplegia; Seizure Disorder Oncologic Medical History: Negative for: Received Chemotherapy; Received Radiation HBO Extended History Items Eyes: Cataracts Immunizations Pneumococcal Vaccine: Received Pneumococcal Vaccination: Yes Implantable Devices None Stetzer, Kimani L. ([id number]) Hospitalization / Surgery History Type of Hospitalization/Surgery R hip pin chole hyster Family and Social History Cancer: Yes - Child,Mother; Diabetes: No; Heart Disease: Yes - Siblings; Hereditary Spherocytosis: No; Hypertension: Yes - Siblings,Child; Kidney Disease: No; Lung Disease: No; Seizures: No; Stroke: No; Thyroid Problems: No;  Tuberculosis: No; Former smoker - quit sine 2003; Marital Status - Widowed; Alcohol Use: Never; Drug Use: No History; Caffeine Use: Daily; Financial Concerns: No; Food, Clothing or Shelter Needs: No; Support System Lacking: No; Transportation Concerns: No Electronic Signature(s) Signed: 06/26/2020 5:02:57 PM By: Worthy Keeler PA-C Signed: 06/27/2020 12:22:13 PM By: Donnamarie Poag Entered ByDonnamarie Poag on 06/26/2020  13:50:01 Garr, Jaspreet L. ([id number]) -------------------------------------------------------------------------------- SuperBill Details Patient Name: Knick, Aislynn L. Date of Service: 06/26/2020 Medical Record Number: [id number] Patient Account Number: [id number] Date of Birth/Sex: 1926-10-21 (87 y.o. F) Treating RN: Dolan Amen Primary Care Provider: [name] Other Clinician: Referring Provider: Daylene Katayama Treating Provider/Extender: Skipper Cliche in Treatment: 0 Diagnosis Coding ICD-10 Codes Code Description [phone] Pressure ulcer of left heel, unstageable I10 Essential (primary) hypertension F01.50 Vascular dementia without behavioral disturbance I25.10 Atherosclerotic heart disease of native coronary artery without angina pectoris Facility Procedures CPT4 Code: 17919957 Description: 99213 - WOUND CARE VISIT-LEV 3 EST PT Modifier: Quantity: 1 CPT4 Code: 90092004 Description: 11042 - DEB SUBQ TISSUE 20 SQ CM/< Modifier: Quantity: 1 CPT4 Code: Description: ICD-10 Diagnosis Description L89.620 Pressure ulcer of left heel, unstageable Modifier: Quantity: Physician Procedures CPT4 Code: 1593012 Description: WC PHYS LEVEL 3 o NEW PT Modifier: 25 Quantity: 1 CPT4 Code: Description: ICD-10 Diagnosis Description L89.620 Pressure ulcer of left heel, unstageable I10 Essential (primary) hypertension F01.50 Vascular dementia without behavioral disturbance I25.10 Atherosclerotic heart disease of native coronary artery without  angina Modifier:  pectoris Quantity: CPT4 Code: 3799094 Description: 11042 - WC PHYS SUBQ TISS 20 SQ CM Modifier: Quantity: 1 CPT4 Code: Description: ICD-10 Diagnosis Description L89.620 Pressure ulcer of left heel, unstageable Modifier: Quantity: Electronic Signature(s) Signed: 06/26/2020 2:27:53 PM By: Worthy Keeler PA-C Entered By: Worthy Keeler on 06/26/2020 14:27:53

## 2020-06-27 NOTE — Progress Notes (Signed)
[name] [name], [name] ([id number]) Visit Report for 06/26/2020 Abuse/Suicide Risk Screen Details Patient Name: [name] [name], [name] Date of Service: 06/26/2020 1:00 PM Medical Record Number: [id number] Patient Account Number: [id number] Date of Birth/Sex: 1926/06/19 (87 y.o. F) Treating RN: Donnamarie Poag Primary Care Zayvier Caravello: [name] Other Clinician: Referring Yoshio Seliga: Daylene Katayama Treating Bryen Hinderman/Extender: Skipper Cliche in Treatment: 0 Abuse/Suicide Risk Screen Items Answer ABUSE RISK SCREEN: Has anyone close to you tried to hurt or harm you recentlyo No Do you feel uncomfortable with anyone in your familyo No Has anyone forced you do things that you didnot want to doo No Electronic Signature(s) Signed: 06/27/2020 12:22:13 PM By: Donnamarie Poag Entered ByDonnamarie Poag on 06/26/2020 13:35:07 [name] [name], [name] ([id number]) -------------------------------------------------------------------------------- Activities of Daily Living Details Patient Name: [name], [name] Date of Service: 06/26/2020 1:00 PM Medical Record Number: [id number] Patient Account Number: [id number] Date of Birth/Sex: 1926-10-17 (87 y.o. F) Treating RN: Donnamarie Poag Primary Care Ferlin Fairhurst: [name] Other Clinician: Referring Brynden Thune: Daylene Katayama Treating Shantanique Hodo/Extender: Skipper Cliche in Treatment: 0 Activities of Daily Living Items Answer Activities of Daily Living (Please select one for each item) Drive Automobile Not Able Take Medications Need Assistance Use Telephone Need Assistance Care for Appearance Need Assistance Use Toilet Need Assistance Bath / Shower Completely Able Dress Self Need Assistance Feed Self Completely Able Walk Need Assistance Get In / Out Bed Need Assistance Housework Not Able Prepare Meals Not Able Handle Money Not Able Shop for Self Not Able Electronic Signature(s) Signed: 06/27/2020 12:22:13 PM By: Donnamarie Poag Entered ByDonnamarie Poag on 06/26/2020  13:24:40 [name] [name], [name] ([id number]) -------------------------------------------------------------------------------- Education Screening Details Patient Name: [name] [name], [name] Date of Service: 06/26/2020 1:00 PM Medical Record Number: [id number] Patient Account Number: [id number] Date of Birth/Sex: [birth year] (87 y.o. F) Treating RN: Donnamarie Poag Primary Care Marcella Charlson: [name] Other Clinician: Referring Eland Lamantia: Daylene Katayama Treating Nawaf Strange/Extender: Skipper Cliche in Treatment: 0 Primary Learner Assessed: Caregiver POA/daughter Learning Preferences/Education Level/Primary Language Learning Preference: Explanation Highest Education Level: College or Above Preferred Language: English Cognitive Barrier Language Barrier: No Translator Needed: No Memory Deficit: Yes Emotional Barrier: No Cultural/Religious Beliefs Affecting Medical Care: No Physical Barrier Impaired Vision: Yes Impaired Hearing: Yes Decreased Hand dexterity: Yes Knowledge/Comprehension Knowledge Level: Low Comprehension Level: Low Ability to understand written instructions: Low Ability to understand verbal instructions: Medium Motivation Anxiety Level: Calm Cooperation: Cooperative Education Importance: Denies Need Interest in Health Problems: Uninterested Perception: Confused Willingness to Engage in Self-Management Low Activities: Readiness to Engage in Self-Management Low Activities: Electronic Signature(s) Signed: 06/27/2020 12:22:13 PM By: Donnamarie Poag Entered ByDonnamarie Poag on 06/26/2020 13:25:38 [name] [name], [name] ([id number]) -------------------------------------------------------------------------------- Fall Risk Assessment Details Patient Name: [name] [name], [name] Date of Service: 06/26/2020 1:00 PM Medical Record Number: [id number] Patient Account Number: [id number] Date of Birth/Sex: 1926-11-11 (87 y.o. F) Treating RN: Donnamarie Poag Primary Care Chlora Mcbain: [name] Other  Clinician: Referring Robertine Kipper: Daylene Katayama Treating Lashaun Krapf/Extender: Skipper Cliche in Treatment: 0 Fall Risk Assessment Items Have you had 2 or more falls in the last 12 monthso 0 Yes Have you had any fall that resulted in injury in the last 12 monthso 0 Yes FALLS RISK SCREEN History of falling - immediate or within 3 months 25 Yes Secondary diagnosis (Do you have 2 or more medical diagnoseso) 0 No Ambulatory aid None/bed rest/wheelchair/nurse 0 No Crutches/cane/walker 15 Yes Furniture 0 No Intravenous therapy Access/Saline/Heparin Lock 0 No Gait/Transferring Normal/ bed rest/ wheelchair 0 No Weak (short steps with or without shuffle,  stooped but able to lift head while walking, may 10 Yes seek support from furniture) Impaired (short steps with shuffle, may have difficulty arising from chair, head down, impaired 0 No balance) Mental Status Oriented to own ability 0 No Electronic Signature(s) Signed: 06/27/2020 12:22:13 PM By: Donnamarie Poag Entered ByDonnamarie Poag on 06/26/2020 13:26:02 [name] [name], [place]. ([id number]) -------------------------------------------------------------------------------- Foot Assessment Details Patient Name: [name] [name], [name] Date of Service: 06/26/2020 1:00 PM Medical Record Number: [id number] Patient Account Number: [id number] Date of Birth/Sex: 25-Nov-1926 (87 y.o. F) Treating RN: Donnamarie Poag Primary Care Aarian Cleaver: [name] Other Clinician: Referring Bradley Handyside: Daylene Katayama Treating Gila Lauf/Extender: Skipper Cliche in Treatment: 0 Foot Assessment Items Site Locations + = Sensation present, - = Sensation absent, C = Callus, U = Ulcer R = Redness, W = Warmth, M = Maceration, PU = Pre-ulcerative lesion F = Fissure, S = Swelling, D = Dryness Assessment Right: Left: Other Deformity: No No Prior Foot Ulcer: No No Prior Amputation: No No Charcot Joint: No No Ambulatory Status: Ambulatory With Help Assistance Device: Walker Gait:  Administrator, arts) Signed: 06/27/2020 12:22:13 PM By: Donnamarie Poag Entered ByDonnamarie Poag on 06/26/2020 13:27:47 [name] [name], [name] ([id number]) -------------------------------------------------------------------------------- Nutrition Risk Screening Details Patient Name: [name] [name], [name] Date of Service: 06/26/2020 1:00 PM Medical Record Number: [id number] Patient Account Number: [id number] Date of Birth/Sex: 11/20/1926 (87 y.o. F) Treating RN: Donnamarie Poag Primary Care Martinique Pizzimenti: [name] Other Clinician: Referring Keyleigh Manninen: Daylene Katayama Treating Solace Wendorff/Extender: Skipper Cliche in Treatment: 0 Height (in): 58 Weight (lbs): 137 Body Mass Index (BMI): 28.6 Nutrition Risk Screening Items Score Screening NUTRITION RISK SCREEN: I have an illness or condition that made me change the kind and/or amount of food I eat 0 No I eat fewer than two meals per day 0 No I eat few fruits and vegetables, or milk products 0 No I have three or more drinks of beer, liquor or wine almost every day 0 No I have tooth or mouth problems that make it hard for me to eat 0 No I don't always have enough money to buy the food I need 0 No I eat alone most of the time 0 No I take three or more different prescribed or over-the-counter drugs a day 1 Yes Without wanting to, I have lost or gained 10 pounds in the last six months 0 No I am not always physically able to shop, cook and/or feed myself 0 No Nutrition Protocols Good Risk Protocol 0 No interventions needed Moderate Risk Protocol High Risk Proctocol Risk Level: Good Risk Score: 1 Electronic Signature(s) Signed: 06/27/2020 12:22:13 PM By: Donnamarie Poag Entered ByDonnamarie Poag on 06/26/2020 13:26:19

## 2020-07-03 ENCOUNTER — Other Ambulatory Visit: Payer: Self-pay

## 2020-07-03 ENCOUNTER — Encounter: Payer: PPO | Admitting: Physician Assistant

## 2020-07-03 DIAGNOSIS — L89623 Pressure ulcer of left heel, stage 3: Secondary | ICD-10-CM | POA: Diagnosis not present

## 2020-07-03 NOTE — Progress Notes (Addendum)
Talcott, Onyinyechi L. ([id number]) Visit Report for 07/03/2020 Chief Complaint Document Details Patient Name: [name] [name], [name] Date of Service: 07/03/2020 11:00 AM Medical Record Number: [id number] Patient Account Number: [id number] Date of Birth/Sex: 1926/09/02 (87 y.o. F) Treating RN: Dolan Amen Primary Care Provider: [name] Other Clinician: Referring Provider: [name] Treating Provider/Extender: Skipper Cliche in Treatment: 1 Information Obtained from: Patient Chief Complaint Left Heel Pressure Ulcer Electronic Signature(s) Signed: 07/03/2020 11:07:32 AM By: Worthy Keeler PA-C Entered By: Worthy Keeler on 07/03/2020 11:07:32 [name] [name], [name] ([id number]) -------------------------------------------------------------------------------- Debridement Details Patient Name: [name], [name] Date of Service: 07/03/2020 11:00 AM Medical Record Number: [id number] Patient Account Number: [id number] Date of Birth/Sex: Aug 22, 1926 (87 y.o. F) Treating RN: Dolan Amen Primary Care Provider: [name] Other Clinician: Referring Provider: [name] Treating Provider/Extender: Skipper Cliche in Treatment: 1 Debridement Performed for Wound #2 Left,Medial Calcaneus Assessment: Performed By: Physician Tommie Sams., PA-C Debridement Type: Debridement Level of Consciousness (Pre- Awake and Alert procedure): Pre-procedure Verification/Time Out Yes - 11:57 Taken: Start Time: 11:57 Total Area Debrided (L x W): 1 (cm) x 1.7 (cm) = 1.7 (cm) Tissue and other material Viable, Non-Viable, Callus, Slough, Subcutaneous, Slough debrided: Level: Skin/Subcutaneous Tissue Debridement Description: Excisional Instrument: Curette Bleeding: Minimum Hemostasis Achieved: Pressure Response to Treatment: Procedure was tolerated well Level of Consciousness (Post- Awake and Alert procedure): Post Debridement Measurements of Total Wound Length: (cm) 1 Stage:  Category/Stage III Width: (cm) 1.7 Depth: (cm) 0.6 Volume: (cm) 0.801 Character of Wound/Ulcer Post Debridement: Stable Post Procedure Diagnosis Same as Pre-procedure Electronic Signature(s) Signed: 07/03/2020 3:02:05 PM By: Charlett Nose RN Signed: 07/03/2020 3:13:06 PM By: Worthy Keeler PA-C Entered By: [name] [name], [name] [name] on 07/03/2020 12:00:14 [name] [name], [name] ([id number]) -------------------------------------------------------------------------------- HPI Details Patient Name: [name], [name] Date of Service: 07/03/2020 11:00 AM Medical Record Number: [id number] Patient Account Number: [id number] Date of Birth/Sex: 10/13/1926 (87 y.o. F) Treating RN: Dolan Amen Primary Care Provider: [name] Other Clinician: Referring Provider: [name] Treating Provider/Extender: Skipper Cliche in Treatment: 1 History of Present Illness HPI Description: 87 year old patient was recently seen by the PCPs office for significant pain right great toe which has been going on since July. She was initially treated with Keflex which she did not complete and after the office visit this time she has been put on doxycycline. at the time of her visit she was found to have a ulcer on the plantar surface of the right great toe and also had a pyogenic granuloma over this area. X-ray of the right foot done 12/29/2014 -- IMPRESSION:Soft-tissue swelling and ulceration right great toe. No underlying bony lytic lesion identified. If osteomyelitis remains of clinical concern MRI can be obtained. Past medical history significant for anemia, chronic kidney disease stage III, obesity, varicose veins, coronary artery disease, gout, history of nicotine addiction given up smoking in 2002, hypertension, status post cardiac catheterization, status post abdominal hysterectomy, cholecystectomy and tonsillectomy. hemoglobin A1c done in August was 5.8 01/22/2015 -- at this stage the [facility] Walking  boat was going to cost them significant amount of money and they want to defer using that at the present time. 01/29/2015 -- she had a podiatry appointment and they have trimmed her toenails. She has not heard back from the vascular office regarding her venous duplex study and I have asked them to call personally so that they can get the appointment soon. 02/06/2015 -- they have made contact with the vascular office and from what I  understand that test has been done but the report is pending. 02/19/2014 -- the vascular test is scheduled for tomorrow Readmission: 06/26/2020 upon evaluation today patient appears for initial evaluation in her clinic that she has been here before in 2016 and has been quite sometime. She did have a fractured hip in February on the 23rd 2022. Subsequently this had to be pinned and she ended up with a pressure injury on her heel following when she was using her foot to help move her around in the bed. Subsequently this has led to the wound that she has been dealing with since that time. She lives at home with her daughter currently she does have dementia. 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 07/03/2020 upon evaluation today patient appears to be doing well with regard to her heel ulcer. She did have arterial studies they appear to be doing excellent it was premature normal across the board with TBI's in the 90s and ABIs well within normal range. Nonetheless there does not appear to be any signs of arterial insufficiency whatsoever. With that being said the patient does have also signs of improvement there is some necrotic tissue in the base of the wound number to try to clear some of that away today I do believe the Iodoflex/Iodosorb is doing well. Electronic Signature(s) Signed: 07/03/2020 1:00:09 PM By: Worthy Keeler PA-C Entered By:  Worthy Keeler on 07/03/2020 13:00:08 [name] [name], [name] ([id number]) -------------------------------------------------------------------------------- Physical Exam Details Patient Name: [name] [name], [name] Date of Service: 07/03/2020 11:00 AM Medical Record Number: [id number] Patient Account Number: [id number] Date of Birth/Sex: 1926/11/04 (87 y.o. F) Treating RN: Dolan Amen Primary Care Provider: [name] Other Clinician: Referring Provider: [name] Treating Provider/Extender: Skipper Cliche in Treatment: 1 Constitutional Well-nourished and well-hydrated in no acute distress. Respiratory normal breathing without difficulty. Psychiatric this patient is able to make decisions and demonstrates good insight into disease process. Alert and Oriented x 3. pleasant and cooperative. Notes Patient's wound bed showed signs of good granulation epithelization at this point. There does not appear to be any signs of infection which is great news and overall very pleased with where things stand today. In general I think the patient is making great progress. I did perform sharp debridement clear away some of the necrotic debris she tolerated that without complication postdebridement wound bed appears to be doing much better. Electronic Signature(s) Signed: 07/03/2020 1:00:30 PM By: Worthy Keeler PA-C Entered By: Worthy Keeler on 07/03/2020 13:00:30 [name] [name], [name] ([id number]) -------------------------------------------------------------------------------- Physician Orders Details Patient Name: Pott, Neeti L. Date of Service: 07/03/2020 11:00 AM Medical Record Number: [id number] Patient Account Number: [id number] Date of Birth/Sex: 1926-09-12 (87 y.o. F) Treating RN: Dolan Amen Primary Care Provider: [name] Other Clinician: Referring Provider: [name] Treating Provider/Extender: Skipper Cliche in Treatment: 1 Verbal / Phone Orders: No Diagnosis  Coding ICD-10 Coding Code Description [phone] Pressure ulcer of left heel, unstageable I10 Essential (primary) hypertension F01.50 Vascular dementia without behavioral disturbance I25.10 Atherosclerotic heart disease of native coronary artery without angina pectoris Follow-up Appointments o Return Appointment in 1 week. Bathing/ Shower/ Hygiene o May shower; gently cleanse wound with antibacterial soap, rinse and pat dry prior to dressing wounds Wound Treatment Wound #2 - Calcaneus Wound Laterality: Left, Medial Cleanser: Normal Saline 3 x Per Week/30 Days Discharge Instructions: Wash your hands with soap and water. Remove  old dressing, discard into plastic bag and place into trash. Cleanse the wound with Normal Saline prior to applying a clean dressing using gauze sponges, not tissues or cotton balls. Do not scrub or use excessive force. Pat dry using gauze sponges, not tissue or cotton balls. Primary Dressing: Iodosorb 40 (g) (Generic) 3 x Per Week/30 Days Discharge Instructions: Apply IodoSorb to wound bed only as directed. Secondary Dressing: Mepilex Border Flex, 4x4 (in/in) (Generic) 3 x Per Week/30 Days Discharge Instructions: Apply to wound as directed. Do not cut. Electronic Signature(s) Signed: 07/03/2020 3:02:05 PM By: [name] [name], [name] Breeding RN Signed: 07/03/2020 3:13:06 PM By: Worthy Keeler PA-C Entered By: [name] [name], [name] [name] on 07/03/2020 12:01:00 [name] [name], [name] ([id number]) -------------------------------------------------------------------------------- Problem List Details Patient Name: [name] [name], [name] Date of Service: 07/03/2020 11:00 AM Medical Record Number: [id number] Patient Account Number: [id number] Date of Birth/Sex: August 17, 1926 (87 y.o. F) Treating RN: Dolan Amen Primary Care Provider: [name] Other Clinician: Referring Provider: [name] Treating Provider/Extender: Skipper Cliche in Treatment: 1 Active  Problems ICD-10 Encounter Code Description Active Date MDM Diagnosis [phone] Pressure ulcer of left heel, stage 3 06/26/2020 No Yes I10 Essential (primary) hypertension 06/26/2020 No Yes F01.50 Vascular dementia without behavioral disturbance 06/26/2020 No Yes I25.10 Atherosclerotic heart disease of native coronary artery without angina 06/26/2020 No Yes pectoris Inactive Problems Resolved Problems Electronic Signature(s) Signed: 07/03/2020 12:56:32 PM By: Worthy Keeler PA-C Previous Signature: 07/03/2020 11:07:26 AM Version By: Worthy Keeler PA-C Entered By: Worthy Keeler on 07/03/2020 12:56:32 [name] [name], [name] ([id number]) -------------------------------------------------------------------------------- Progress Note Details Patient Name: [name] [name], [name] Date of Service: 07/03/2020 11:00 AM Medical Record Number: [id number] Patient Account Number: [id number] Date of Birth/Sex: [birth year] (87 y.o. F) Treating RN: Dolan Amen Primary Care Provider: [name] Other Clinician: Referring Provider: [name] Treating Provider/Extender: Skipper Cliche in Treatment: 1 Subjective Chief Complaint Information obtained from Patient Left Heel Pressure Ulcer History of Present Illness (HPI) 87 year old patient was recently seen by the PCPs office for significant pain right great toe which has been going on since July. She was initially treated with Keflex which she did not complete and after the office visit this time she has been put on doxycycline. at the time of her visit she was found to have a ulcer on the plantar surface of the right great toe and also had a pyogenic granuloma over this area. X-ray of the right foot done 12/29/2014 -- IMPRESSION:Soft-tissue swelling and ulceration right great toe. No underlying bony lytic lesion identified. If osteomyelitis remains of clinical concern MRI can be obtained. Past medical history significant for anemia, chronic kidney  disease stage III, obesity, varicose veins, coronary artery disease, gout, history of nicotine addiction given up smoking in 2002, hypertension, status post cardiac catheterization, status post abdominal hysterectomy, cholecystectomy and tonsillectomy. hemoglobin A1c done in August was 5.8 01/22/2015 -- at this stage the [facility] Walking boat was going to cost them significant amount of money and they want to defer using that at the present time. 01/29/2015 -- she had a podiatry appointment and they have trimmed her toenails. She has not heard back from the vascular office regarding her venous duplex study and I have asked them to call personally so that they can get the appointment soon. 02/06/2015 -- they have made contact with the vascular office and from what I understand that test has been done but the report is pending. 02/19/2014 -- the vascular test is scheduled for tomorrow Readmission: 06/26/2020 upon evaluation  today patient appears for initial evaluation in her clinic that she has been here before in 2016 and has been quite sometime. She did have a fractured hip in February on the 23rd 2022. Subsequently this had to be pinned and she ended up with a pressure injury on her heel following when she was using her foot to help move her around in the bed. Subsequently this has led to the wound that she has been dealing with since that time. She lives at home with her daughter currently she does have dementia. 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 07/03/2020 upon evaluation today patient appears to be doing well with regard to her heel ulcer. She did have arterial studies they appear to be doing excellent it was premature normal across the board with TBI's in the 90s and ABIs well within normal range. Nonetheless there does not appear to be any signs of arterial  insufficiency whatsoever. With that being said the patient does have also signs of improvement there is some necrotic tissue in the base of the wound number to try to clear some of that away today I do believe the Iodoflex/Iodosorb is doing well. Objective Constitutional Well-nourished and well-hydrated in no acute distress. Vitals Time Taken: 11:31 AM, Height: 58 in, Weight: 137 lbs, BMI: 28.6, Temperature: 98.1 F, Pulse: 78 bpm, Respiratory Rate: 18 breaths/min, Blood Pressure: 116/73 mmHg. Respiratory normal breathing without difficulty. Psychiatric this patient is able to make decisions and demonstrates good insight into disease process. Alert and Oriented x 3. pleasant and cooperative. General Notes: Patient's wound bed showed signs of good granulation epithelization at this point. There does not appear to be any signs of infection which is great news and overall very pleased with where things stand today. In general I think the patient is making great progress. I did [name] [name], [name] ([id number]) perform sharp debridement clear away some of the necrotic debris she tolerated that without complication postdebridement wound bed appears to be doing much better. Integumentary (Hair, Skin) Wound #2 status is Open. Original cause of wound was Pressure Injury. The date acquired was: 04/11/2020. The wound has been in treatment 1 weeks. The wound is located on the Left,Medial Calcaneus. The wound measures 1cm length x 1.7cm width x 0.4cm depth; 1.335cm^2 area and 0.534cm^3 volume. There is Fat Layer (Subcutaneous Tissue) exposed. There is no tunneling noted, however, there is undermining starting at 1:00 and ending at 3:00 with a maximum distance of 0.4cm. There is a medium amount of serosanguineous drainage noted. There is small (1-33%) pink granulation within the wound bed. There is a large (67-100%) amount of necrotic tissue within the wound bed including Adherent Slough. Assessment Active  Problems ICD-10 Pressure ulcer of left heel, stage 3 Essential (primary) hypertension Vascular dementia without behavioral disturbance Atherosclerotic heart disease of native coronary artery without angina pectoris Procedures Wound #2 Pre-procedure diagnosis of Wound #2 is a Pressure Ulcer located on the Left,Medial Calcaneus . There was a Excisional Skin/Subcutaneous Tissue Debridement with a total area of 1.7 sq cm performed by Tommie Sams., PA-C. With the following instrument(s): Curette to remove Viable and Non-Viable tissue/material. Material removed includes Callus, Subcutaneous Tissue, and Slough. A time out was conducted at 11:57, prior to the start of the procedure. A Minimum amount of bleeding was controlled with Pressure. The procedure was tolerated well. Post Debridement Measurements: 1cm length x 1.7cm  width x 0.6cm depth; 0.801cm^3 volume. Post debridement Stage noted as Category/Stage III. Character of Wound/Ulcer Post Debridement is stable. Post procedure Diagnosis Wound #2: Same as Pre-Procedure Plan Follow-up Appointments: Return Appointment in 1 week. Bathing/ Shower/ Hygiene: May shower; gently cleanse wound with antibacterial soap, rinse and pat dry prior to dressing wounds WOUND #2: - Calcaneus Wound Laterality: Left, Medial Cleanser: Normal Saline 3 x Per Week/30 Days Discharge Instructions: Wash your hands with soap and water. Remove old dressing, discard into plastic bag and place into trash. Cleanse the wound with Normal Saline prior to applying a clean dressing using gauze sponges, not tissues or cotton balls. Do not scrub or use excessive force. Pat dry using gauze sponges, not tissue or cotton balls. Primary Dressing: Iodosorb 40 (g) (Generic) 3 x Per Week/30 Days Discharge Instructions: Apply IodoSorb to wound bed only as directed. Secondary Dressing: Mepilex Border Flex, 4x4 (in/in) (Generic) 3 x Per Week/30 Days Discharge Instructions: Apply to wound as  directed. Do not cut. 1. Would recommend currently that we continue with the Iodoflex. I think this is probably still the best [name] [name] to go based on what I am seeing and the patient is in agreement with that plan. 2. I am also can recommend that we have the patient go ahead and continue with dressing changes 3 times a week I think that is appropriate. 3. We will cover this with a border foam dressing. We will see patient back for reevaluation in 1 week here in the clinic. If anything worsens or changes patient will contact our office for additional recommendations. Electronic Signature(s) [name] [name], [name] ([id number]) Signed: 07/03/2020 1:01:42 PM By: Worthy Keeler PA-C Entered By: Worthy Keeler on 07/03/2020 13:01:41 [name] [name], Kayslee L. ([id number]) -------------------------------------------------------------------------------- SuperBill Details Patient Name: Whirley, Satcha L. Date of Service: 07/03/2020 Medical Record Number: [id number] Patient Account Number: [id number] Date of Birth/Sex: [birth year] (87 y.o. F) Treating RN: Dolan Amen Primary Care Provider: [name] Other Clinician: Referring Provider: [name] Treating Provider/Extender: Skipper Cliche in Treatment: 1 Diagnosis Coding ICD-10 Codes Code Description [phone] Pressure ulcer of left heel, stage 3 I10 Essential (primary) hypertension F01.50 Vascular dementia without behavioral disturbance I25.10 Atherosclerotic heart disease of native coronary artery without angina pectoris Facility Procedures CPT4 Code: [id number] Description: [id number] - DEB SUBQ TISSUE 20 SQ CM/< Modifier: Quantity: 1 CPT4 Code: Description: ICD-10 Diagnosis Description L89.623 Pressure ulcer of left heel, stage 3 Modifier: Quantity: Physician Procedures CPT4 Code: [id number] Description: 11042 - WC PHYS SUBQ TISS 20 SQ CM Modifier: Quantity: 1 CPT4 Code: Description: ICD-10 Diagnosis Description L89.623 Pressure ulcer of left  heel, stage 3 Modifier: Quantity: Electronic Signature(s) Signed: 07/03/2020 1:01:52 PM By: Worthy Keeler PA-C Previous Signature: 07/03/2020 12:59:19 PM Version By: [name] [name], [name] Breeding RN Entered By: Worthy Keeler on 07/03/2020 13:01:51

## 2020-07-03 NOTE — Progress Notes (Addendum)
Levee, Johari L. ([id number]) Visit Report for 07/03/2020 Arrival Information Details Patient Name: [name] [name], [name] Date of Service: 07/03/2020 11:00 AM Medical Record Number: [id number] Patient Account Number: [id number] Date of Birth/Sex: 1926-10-07 (87 y.o. F) Treating RN: Donnamarie Poag Primary Care Deziah Renwick: [name] Other Clinician: Referring Ventura Hollenbeck: [name] Treating Analeigha Nauman/Extender: Skipper Cliche in Treatment: 1 Visit Information History Since Last Visit Added or deleted any medications: No Patient Arrived: Wheel Chair Had a fall or experienced change in No Arrival Time: 11:29 activities of daily living that may affect Accompanied By: daughter risk of falls: Transfer Assistance: EasyPivot Patient Lift Hospitalized since last visit: No Patient Identification Verified: Yes Has Dressing in Place as Prescribed: Yes Secondary Verification Process Completed: Yes Pain Present Now: No Patient Has Alerts: Yes Patient Alerts: NOT diabetic ABI R1.18 L1.25 06/27/20 Electronic Signature(s) Signed: 07/03/2020 1:06:44 PM By: Charlett Nose RN Previous Signature: 07/03/2020 1:06:33 PM Version By: Georges Mouse, Minus Breeding RN Entered By: Georges Mouse, Kenia on 07/03/2020 13:06:44 Innis, Taquanna L. ([id number]) -------------------------------------------------------------------------------- Clinic Level of Care Assessment Details Patient Name: [name] [name], [name] Date of Service: 07/03/2020 11:00 AM Medical Record Number: [id number] Patient Account Number: [id number] Date of Birth/Sex: 1926/12/23 (87 y.o. F) Treating RN: Dolan Amen Primary Care Trinaty Bundrick: [name] Other Clinician: Referring Arlan Birks: [name] Treating Iysis Germain/Extender: Skipper Cliche in Treatment: 1 Clinic Level of Care Assessment Items TOOL 1 Quantity Score '[]'$  - Use when EandM and Procedure is performed on INITIAL visit 0 ASSESSMENTS - Nursing Assessment / Reassessment '[]'$  -  General Physical Exam (combine w/ comprehensive assessment (listed just below) when performed on new 0 pt. evals) '[]'$  - 0 Comprehensive Assessment (HX, ROS, Risk Assessments, Wounds Hx, etc.) ASSESSMENTS - Wound and Skin Assessment / Reassessment '[]'$  - Dermatologic / Skin Assessment (not related to wound area) 0 ASSESSMENTS - Ostomy and/or Continence Assessment and Care '[]'$  - Incontinence Assessment and Management 0 '[]'$  - 0 Ostomy Care Assessment and Management (repouching, etc.) PROCESS - Coordination of Care '[]'$  - Simple Patient / Family Education for ongoing care 0 '[]'$  - 0 Complex (extensive) Patient / Family Education for ongoing care '[]'$  - 0 Staff obtains Programmer, systems, Records, Test Results / Process Orders '[]'$  - 0 Staff telephones HHA, Nursing Homes / Clarify orders / etc '[]'$  - 0 Routine Transfer to another Facility (non-emergent condition) '[]'$  - 0 Routine Hospital Admission (non-emergent condition) '[]'$  - 0 New Admissions / Biomedical engineer / Ordering NPWT, Apligraf, etc. '[]'$  - 0 Emergency Hospital Admission (emergent condition) PROCESS - Special Needs '[]'$  - Pediatric / Minor Patient Management 0 '[]'$  - 0 Isolation Patient Management '[]'$  - 0 Hearing / Language / Visual special needs '[]'$  - 0 Assessment of Community assistance (transportation, D/C planning, etc.) '[]'$  - 0 Additional assistance / Altered mentation '[]'$  - 0 Support Surface(s) Assessment (bed, cushion, seat, etc.) INTERVENTIONS - Miscellaneous '[]'$  - External ear exam 0 '[]'$  - 0 Patient Transfer (multiple staff / Civil Service fast streamer / Similar devices) '[]'$  - 0 Simple Staple / Suture removal (25 or less) '[]'$  - 0 Complex Staple / Suture removal (26 or more) '[]'$  - 0 Hypo/Hyperglycemic Management (do not check if billed separately) '[]'$  - 0 Ankle / Brachial Index (ABI) - do not check if billed separately Has the patient been seen at the hospital within the last three years: Yes Total Score: 0 Level Of Care: ____ [name] [name], [name]  ([id number]) Electronic Signature(s) Signed: 07/03/2020 3:02:05 PM By: Georges Mouse, Minus Breeding RN Entered By: Georges Mouse, Minus Breeding on  07/03/2020 12:01:06 [name] [name], [name] ([id number]) -------------------------------------------------------------------------------- Encounter Discharge Information Details Patient Name: [name] [name], [name] Date of Service: 07/03/2020 11:00 AM Medical Record Number: [id number] Patient Account Number: [id number] Date of Birth/Sex: 05/01/1926 (87 y.o. F) Treating RN: Donnamarie Poag Primary Care Nashalie Sallis: [name] Other Clinician: Referring Selam Pietsch: [name] Treating Leiliana Foody/Extender: Skipper Cliche in Treatment: 1 Encounter Discharge Information Items Post Procedure Vitals Discharge Condition: Stable Temperature (F): 97.8 Ambulatory Status: Wheelchair Pulse (bpm): 78 Discharge Destination: Home Respiratory Rate (breaths/min): 18 Transportation: Private Auto Blood Pressure (mmHg): 116/73 Accompanied By: daughter Schedule Follow-up Appointment: No Clinical Summary of Care: Electronic Signature(s) Signed: 07/03/2020 2:35:55 PM By: Donnamarie Poag Entered ByDonnamarie Poag on 07/03/2020 12:14:05 [name] [name], [name] ([id number]) -------------------------------------------------------------------------------- Lower Extremity Assessment Details Patient Name: [name] [name], [name] Date of Service: 07/03/2020 11:00 AM Medical Record Number: [id number] Patient Account Number: [id number] Date of Birth/Sex: 13-Jul-1926 (87 y.o. F) Treating RN: Donnamarie Poag Primary Care Kathlean Cinco: [name] Other Clinician: Referring Essica Kiker: [name] Treating Sorin Frimpong/Extender: Skipper Cliche in Treatment: 1 Edema Assessment Assessed: [Left: Yes] [Right: No] Edema: [Left: Ye] [Right: s] Calf Left: Right: Point of Measurement: 32 cm From Medial Instep 34.5 cm Ankle Left: Right: Point of Measurement: 12 cm From Medial Instep 27 cm Vascular  Assessment Pulses: Dorsalis Pedis Palpable: [Left:Yes] Electronic Signature(s) Signed: 07/03/2020 2:35:55 PM By: Donnamarie Poag Entered ByDonnamarie Poag on 07/03/2020 11:40:12 Sebastiani, Emonie L. ([id number]) -------------------------------------------------------------------------------- Multi Wound Chart Details Patient Name: [name] [name], [name] Date of Service: 07/03/2020 11:00 AM Medical Record Number: [id number] Patient Account Number: [id number] Date of Birth/Sex: November 20, 1926 (87 y.o. F) Treating RN: Dolan Amen Primary Care Jeannelle Wiens: [name] Other Clinician: Referring Griffin Gerrard: [name] Treating Crecencio Kwiatek/Extender: Skipper Cliche in Treatment: 1 Vital Signs Height(in): 73 Pulse(bpm): 49 Weight(lbs): 137 Blood Pressure(mmHg): 116/73 Body Mass Index(BMI): 29 Temperature(F): 98.1 Respiratory Rate(breaths/min): 18 Photos: [N/A:N/A] Wound Location: Left, Medial Calcaneus N/A N/A Wounding Event: Pressure Injury N/A N/A Primary Etiology: Pressure Ulcer N/A N/A Comorbid History: Cataracts, Anemia, Coronary Artery N/A N/A Disease, Hypertension, Myocardial Infarction, End Stage Renal Disease, Gout, Osteoarthritis, Dementia Date Acquired: 04/11/2020 N/A N/A Weeks of Treatment: 1 N/A N/A Wound Status: Open N/A N/A Measurements L x W x D (cm) 1x1.7x0.4 N/A N/A Area (cm) : 1.335 N/A N/A Volume (cm) : 0.534 N/A N/A % Reduction in Area: -13.30% N/A N/A % Reduction in Volume: -13.40% N/A N/A Starting Position 1 (o'clock): 1 Ending Position 1 (o'clock): 3 Maximum Distance 1 (cm): 0.4 Undermining: Yes N/A N/A Classification: Unstageable/Unclassified N/A N/A Exudate Amount: Medium N/A N/A Exudate Type: Serosanguineous N/A N/A Exudate Color: red, brown N/A N/A Granulation Amount: Small (1-33%) N/A N/A Granulation Quality: Pink N/A N/A Necrotic Amount: Large (67-100%) N/A N/A Exposed Structures: Fat Layer (Subcutaneous Tissue): N/A N/A Yes Fascia: No Tendon:  No Muscle: No Joint: No Bone: No Treatment Notes Electronic Signature(s) Signed: 07/03/2020 3:02:05 PM By: Georges Mouse, Minus Breeding RN [name] [name], [name] ([id number]) Entered By: Georges Mouse, Minus Breeding on 07/03/2020 11:57:41 [name] [name], [name] ([id number]) -------------------------------------------------------------------------------- [place] Details Patient Name: Scibilia, Electra L. Date of Service: 07/03/2020 11:00 AM Medical Record Number: [id number] Patient Account Number: [id number] Date of Birth/Sex: Jun 18, 1926 (87 y.o. F) Treating RN: Dolan Amen Primary Care Beretta Ginsberg: [name] Other Clinician: Referring Caydence Koenig: [name] Treating Izaia Say/Extender: Skipper Cliche in Treatment: 1 Active Inactive Wound/Skin Impairment Nursing Diagnoses: Impaired tissue integrity Goals: Patient/caregiver will verbalize understanding of skin care regimen Date Initiated: 06/26/2020 Target Resolution Date: 06/26/2020 Goal Status: Active Ulcer/skin breakdown will  have a volume reduction of 30% by week 4 Date Initiated: 06/26/2020 Target Resolution Date: 07/27/2020 Goal Status: Active Ulcer/skin breakdown will have a volume reduction of 50% by week 8 Date Initiated: 06/26/2020 Target Resolution Date: 08/26/2020 Goal Status: Active Ulcer/skin breakdown will have a volume reduction of 80% by week 12 Date Initiated: 06/26/2020 Target Resolution Date: 09/26/2020 Goal Status: Active Ulcer/skin breakdown will heal within 14 weeks Date Initiated: 06/26/2020 Target Resolution Date: 10/27/2020 Goal Status: Active Interventions: Assess patient/caregiver ability to obtain necessary supplies Assess patient/caregiver ability to perform ulcer/skin care regimen upon admission and as needed Assess ulceration(s) every visit Provide education on ulcer and skin care Treatment Activities: Referred to DME Giana Castner for dressing supplies : 06/26/2020 Skin care regimen initiated :  06/26/2020 Notes: Electronic Signature(s) Signed: 07/03/2020 3:02:05 PM By: Georges Mouse, Minus Breeding RN Entered By: Georges Mouse, Minus Breeding on 07/03/2020 11:56:50 [name] [name], [name] ([id number]) -------------------------------------------------------------------------------- Pain Assessment Details Patient Name: [name] [name], [name] Date of Service: 07/03/2020 11:00 AM Medical Record Number: [id number] Patient Account Number: [id number] Date of Birth/Sex: 1926-09-18 (87 y.o. F) Treating RN: Donnamarie Poag Primary Care Moyinoluwa Dawe: [name] Other Clinician: Referring Westlyn Glaza: [name] Treating Castle Lamons/Extender: Skipper Cliche in Treatment: 1 Active Problems Location of Pain Severity and Description of Pain Patient Has Paino No Site Locations Rate the pain. Current Pain Level: 0 Pain Management and Medication Current Pain Management: Electronic Signature(s) Signed: 07/03/2020 2:35:55 PM By: Donnamarie Poag Entered By: Donnamarie Poag on 07/03/2020 11:32:46 [name] [name], Kynnadi L. ([id number]) -------------------------------------------------------------------------------- Patient/Caregiver Education Details Patient Name: Gervais, Rodney L. Date of Service: 07/03/2020 11:00 AM Medical Record Number: [id number] Patient Account Number: [id number] Date of Birth/Gender: 1926-03-17 (87 y.o. F) Treating RN: Dolan Amen Primary Care Physician: [name] Other Clinician: Referring Physician: [name] Treating Physician/Extender: Skipper Cliche in Treatment: 1 Education Assessment Education Provided To: Patient Education Topics Provided Wound/Skin Impairment: Methods: Explain/Verbal Responses: State content correctly Electronic Signature(s) Signed: 07/03/2020 3:02:05 PM By: Georges Mouse, Minus Breeding RN Entered By: Georges Mouse, Minus Breeding on 07/03/2020 12:02:00 Flow, Oceania L. ([id number]) -------------------------------------------------------------------------------- Wound  Assessment Details Patient Name: Mochizuki, Jullisa L. Date of Service: 07/03/2020 11:00 AM Medical Record Number: [id number] Patient Account Number: [id number] Date of Birth/Sex: 1926-10-08 (87 y.o. F) Treating RN: Donnamarie Poag Primary Care Aracelie Addis: [name] Other Clinician: Referring Savalas Monje: [name] Treating Margarito Dehaas/Extender: Skipper Cliche in Treatment: 1 Wound Status Wound Number: 2 Primary Pressure Ulcer Etiology: Wound Location: Left, Medial Calcaneus Wound Open Wounding Event: Pressure Injury Status: Date Acquired: 04/11/2020 Comorbid Cataracts, Anemia, Coronary Artery Disease, Weeks Of Treatment: 1 History: Hypertension, Myocardial Infarction, End Stage Renal Clustered Wound: No Disease, Gout, Osteoarthritis, Dementia Photos Wound Measurements Length: (cm) 1 % Red Width: (cm) 1.7 % Red Depth: (cm) 0.4 [place] Area: (cm) 1.335 Unde Volume: (cm) 0.534 St End Max uction in Area: -13.3% uction in Volume: -13.4% ling: No rmining: Yes arting Position (o'clock): 1 ing Position (o'clock): 3 imum Distance: (cm) 0.4 Wound Description Classification: Category/Stage III Foul Exudate Amount: Medium Sloug Exudate Type: Serosanguineous Exudate Color: red, brown Odor After Cleansing: No h/Fibrino Yes Wound Bed Granulation Amount: Small (1-33%) Exposed Structure Granulation Quality: Pink Fascia Exposed: No Necrotic Amount: Large (67-100%) Fat Layer (Subcutaneous Tissue) Exposed: Yes Necrotic Quality: Adherent Slough Tendon Exposed: No Muscle Exposed: No Joint Exposed: No Bone Exposed: No Treatment Notes Wound #2 (Calcaneus) Wound Laterality: Left, Medial Cleanser Normal Saline Alkire, Jadzia L. ([id number]) Discharge Instruction: Wash your hands with soap and water. Remove old dressing, discard into plastic bag and place  into trash. Cleanse the wound with Normal Saline prior to applying a clean dressing using gauze sponges, not tissues or cotton  balls. Do not scrub or use excessive force. Pat dry using gauze sponges, not tissue or cotton balls. Peri-Wound Care Topical Primary Dressing Iodosorb 40 (g) Discharge Instruction: Apply IodoSorb to wound bed only as directed. Secondary Dressing Mepilex Border Flex, 4x4 (in/in) Discharge Instruction: Apply to wound as directed. Do not cut. Secured With Compression Wrap Compression Stockings Add-Ons Electronic Signature(s) Signed: 07/03/2020 2:35:55 PM By: Donnamarie Poag Signed: 07/03/2020 3:02:05 PM By: Georges Mouse, Minus Breeding RN Entered By: Georges Mouse, Kenia on 07/03/2020 12:01:25 [place], [place] ([id number]) -------------------------------------------------------------------------------- Vitals Details Patient Name: Jiles, Jillaine L. Date of Service: 07/03/2020 11:00 AM Medical Record Number: [id number] Patient Account Number: [id number] Date of Birth/Sex: Jun 15, 1926 (87 y.o. F) Treating RN: Donnamarie Poag Primary Care Kingjames Coury: [name] Other Clinician: Referring Joanne Salah: [name] Treating Onis Markoff/Extender: Skipper Cliche in Treatment: 1 Vital Signs Time Taken: 11:31 Temperature (F): 98.1 Height (in): 58 Pulse (bpm): 78 Weight (lbs): 137 Respiratory Rate (breaths/min): 18 Body Mass Index (BMI): 28.6 Blood Pressure (mmHg): 116/73 Reference Range: 80 - 120 mg / dl Electronic Signature(s) Signed: 07/03/2020 2:35:55 PM By: Donnamarie Poag Entered ByDonnamarie Poag on 07/03/2020 11:32:27

## 2020-07-04 DIAGNOSIS — S72034A Nondisplaced midcervical fracture of right femur, initial encounter for closed fracture: Secondary | ICD-10-CM | POA: Diagnosis not present

## 2020-07-10 ENCOUNTER — Other Ambulatory Visit: Payer: Self-pay

## 2020-07-10 ENCOUNTER — Encounter: Payer: PPO | Admitting: Internal Medicine

## 2020-07-10 DIAGNOSIS — L89623 Pressure ulcer of left heel, stage 3: Secondary | ICD-10-CM | POA: Diagnosis not present

## 2020-07-12 NOTE — Progress Notes (Signed)
Esbenshade, Jarae L. ([id number]) Visit Report for 07/10/2020 Debridement Details Patient Name: [name] [name], [name] Date of Service: 07/10/2020 2:30 PM Medical Record Number: [id number] Patient Account Number: [id number] Date of Birth/Sex: 28-Mar-1926 (87 y.o. F) Treating RN: Primary Care Provider: [name] Other Clinician: Referring Provider: [name] Treating Provider/Extender: Tito Dine in Treatment: 2 Debridement Performed for Wound #2 Left,Medial Calcaneus Assessment: Performed By: Physician Ricard Dillon, MD Debridement Type: Debridement Level of Consciousness (Pre- Awake and Alert procedure): Pre-procedure Verification/Time Out Yes - 14:47 Taken: Pain Control: Lidocaine Total Area Debrided (L x W): 1 (cm) x 1.5 (cm) = 1.5 (cm) Tissue and other material Viable, Slough, Subcutaneous, Slough debrided: Level: Skin/Subcutaneous Tissue Debridement Description: Excisional Instrument: Curette Bleeding: Moderate Hemostasis Achieved: Pressure Response to Treatment: Procedure was tolerated well Level of Consciousness (Post- Awake and Alert procedure): Post Debridement Measurements of Total Wound Length: (cm) 1 Stage: Category/Stage III Width: (cm) 1.5 Depth: (cm) 0.6 Volume: (cm) 0.707 Character of Wound/Ulcer Post Debridement: Stable Post Procedure Diagnosis Same as Pre-procedure Electronic Signature(s) Signed: 07/12/2020 8:39:06 AM By: Linton Ham MD Entered By: Linton Ham on 07/10/2020 14:57:54 [name] [name], [name] ([id number]) -------------------------------------------------------------------------------- HPI Details Patient Name: [name] [name], [name] Date of Service: 07/10/2020 2:30 PM Medical Record Number: [id number] Patient Account Number: [id number] Date of Birth/Sex: Sep 12, 1926 (87 y.o. F) Treating RN: Primary Care Provider: [name] Other Clinician: Referring Provider: [name] Treating Provider/Extender: Tito Dine in Treatment: 2 History of Present Illness HPI Description: 87 year old patient was recently seen by the PCPs office for significant pain right great toe which has been going on since July. She was initially treated with Keflex which she did not complete and after the office visit this time she has been put on doxycycline. at the time of her visit she was found to have a ulcer on the plantar surface of the right great toe and also had a pyogenic granuloma over this area. X-ray of the right foot done 12/29/2014 -- IMPRESSION:Soft-tissue swelling and ulceration right great toe. No underlying bony lytic lesion identified. If osteomyelitis remains of clinical concern MRI can be obtained. Past medical history significant for anemia, chronic kidney disease stage III, obesity, varicose veins, coronary artery disease, gout, history of nicotine addiction given up smoking in 2002, hypertension, status post cardiac catheterization, status post abdominal hysterectomy, cholecystectomy and tonsillectomy. hemoglobin A1c done in August was 5.8 01/22/2015 -- at this stage the [facility] Walking boat was going to cost them significant amount of money and they want to defer using that at the present time. 01/29/2015 -- she had a podiatry appointment and they have trimmed her toenails. She has not heard back from the vascular office regarding her venous duplex study and I have asked them to call personally so that they can get the appointment soon. 02/06/2015 -- they have made contact with the vascular office and from what I understand that test has been done but the report is pending. 02/19/2014 -- the vascular test is scheduled for tomorrow Readmission: 06/26/2020 upon evaluation today patient appears for initial evaluation in her clinic that she has been here before in 2016 and has been quite sometime. She did have a fractured hip in February on the 23rd 2022. Subsequently this had to be pinned and she  ended up with a pressure injury on her heel following when she was using her foot to help move her around in the bed. Subsequently this has led to the wound that she has been dealing  with since that time. She lives at home with her daughter currently she does have dementia. 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 07/03/2020 upon evaluation today patient appears to be doing well with regard to her heel ulcer. She did have arterial studies they appear to be doing excellent it was premature normal across the board with TBI's in the 90s and ABIs well within normal range. Nonetheless there does not appear to be any signs of arterial insufficiency whatsoever. With that being said the patient does have also signs of improvement there is some necrotic tissue in the base of the wound number to try to clear some of that away today I do believe the Iodoflex/Iodosorb is doing well. 5/24; difficult punched out wound on the left medial heel. She has been using [employer]/film/video) Signed: 07/12/2020 8:39:06 AM By: Linton Ham MD Entered By: Linton Ham on 07/10/2020 14:58:29 [name] [name], [name] ([id number]) -------------------------------------------------------------------------------- Physical Exam Details Patient Name: [name] [name], [name] Date of Service: 07/10/2020 2:30 PM Medical Record Number: [id number] Patient Account Number: [id number] Date of Birth/Sex: 11-20-1926 (87 y.o. F) Treating RN: Primary Care Provider: [name] Other Clinician: Referring Provider: [name] Treating Provider/Extender: Tito Dine in Treatment: 2 Constitutional Sitting or standing Blood Pressure is within target range for patient.. Pulse regular and within target range for patient.Marland Kitchen Respirations regular, non- labored and within target range..  Temperature is normal and within the target range for the patient.Marland Kitchen appears in no distress. Notes Wound exam; unfortunately him in the clinic today with a completely nonviable surface. Difficult debridement with a #3 curette subcutaneous tissue to try and remove some of this very adherent surface material. Hemostasis with direct pressure. This has considerable depth but no palpable bone and no overt infection Electronic Signature(s) Signed: 07/12/2020 8:39:06 AM By: Linton Ham MD Entered By: Linton Ham on 07/10/2020 15:06:18 [name] [name], [name] ([id number]) -------------------------------------------------------------------------------- Physician Orders Details Patient Name: [name] [name], [name] Date of Service: 07/10/2020 2:30 PM Medical Record Number: [id number] Patient Account Number: [id number] Date of Birth/Sex: 19-Oct-1926 (88 y.o. F) Treating RN: Cornell Barman Primary Care Provider: [name] Other Clinician: Referring Provider: [name] Treating Provider/Extender: Tito Dine in Treatment: 2 Verbal / Phone Orders: No Diagnosis Coding Follow-up Appointments o Return Appointment in 1 week. Bathing/ Shower/ Hygiene o May shower; gently cleanse wound with antibacterial soap, rinse and pat dry prior to dressing wounds Off-Loading o Other: - Offloading boots while in bed; Try pillow under sheet to keep it in place. Wound Treatment Wound #2 - Calcaneus Wound Laterality: Left, Medial Cleanser: Normal Saline 3 x Per Week/30 Days Discharge Instructions: Wash your hands with soap and water. Remove old dressing, discard into plastic bag and place into trash. Cleanse the wound with Normal Saline prior to applying a clean dressing using gauze sponges, not tissues or cotton balls. Do not scrub or use excessive force. Pat dry using gauze sponges, not tissue or cotton balls. Primary Dressing: Iodosorb 40 (g) (Generic) 3 x Per Week/30 Days Discharge Instructions:  Apply IodoSorb to wound bed only as directed. Secondary Dressing: Mepilex Border Flex, 4x4 (in/in) (Generic) 3 x Per Week/30 Days Discharge Instructions: Apply to wound as directed. Do not cut. Electronic Signature(s) Signed: 07/10/2020 5:46:47 PM By: Gretta Cool, BSN, RN, CWS, Kim RN, BSN Signed: 07/12/2020 8:39:06 AM By: Linton Ham MD Entered By: Gretta Cool, BSN, RN, CWS, Kim on  07/10/2020 14:54:18 [name] [name], [name] ([id number]) -------------------------------------------------------------------------------- Problem List Details Patient Name: [name] [name], [name] Date of Service: 07/10/2020 2:30 PM Medical Record Number: [id number] Patient Account Number: [id number] Date of Birth/Sex: 21-Mar-1926 (87 y.o. F) Treating RN: Primary Care Provider: [name] Other Clinician: Referring Provider: [name] Treating Provider/Extender: Tito Dine in Treatment: 2 Active Problems ICD-10 Encounter Code Description Active Date MDM Diagnosis [id number] Pressure ulcer of left heel, stage 3 06/26/2020 No Yes I10 Essential (primary) hypertension 06/26/2020 No Yes F01.50 Vascular dementia without behavioral disturbance 06/26/2020 No Yes I25.10 Atherosclerotic heart disease of native coronary artery without angina 06/26/2020 No Yes pectoris Inactive Problems Resolved Problems Electronic Signature(s) Signed: 07/12/2020 8:39:06 AM By: Linton Ham MD Entered By: Linton Ham on 07/10/2020 14:57:30 [name] [name], [name] ([id number]) -------------------------------------------------------------------------------- Progress Note Details Patient Name: [name] [name], [name] Date of Service: 07/10/2020 2:30 PM Medical Record Number: [id number] Patient Account Number: [id number] Date of Birth/Sex: March 08, 1926 (87 y.o. F) Treating RN: Primary Care Provider: [name] Other Clinician: Referring Provider: [name] Treating Provider/Extender: Tito Dine in Treatment:  2 Subjective History of Present Illness (HPI) 87 year old patient was recently seen by the PCPs office for significant pain right great toe which has been going on since July. She was initially treated with Keflex which she did not complete and after the office visit this time she has been put on doxycycline. at the time of her visit she was found to have a ulcer on the plantar surface of the right great toe and also had a pyogenic granuloma over this area. X-ray of the right foot done 12/29/2014 -- IMPRESSION:Soft-tissue swelling and ulceration right great toe. No underlying bony lytic lesion identified. If osteomyelitis remains of clinical concern MRI can be obtained. Past medical history significant for anemia, chronic kidney disease stage III, obesity, varicose veins, coronary artery disease, gout, history of nicotine addiction given up smoking in 2002, hypertension, status post cardiac catheterization, status post abdominal hysterectomy, cholecystectomy and tonsillectomy. hemoglobin A1c done in August was 5.8 01/22/2015 -- at this stage the [facility] Walking boat was going to cost them significant amount of money and they want to defer using that at the present time. 01/29/2015 -- she had a podiatry appointment and they have trimmed her toenails. She has not heard back from the vascular office regarding her venous duplex study and I have asked them to call personally so that they can get the appointment soon. 02/06/2015 -- they have made contact with the vascular office and from what I understand that test has been done but the report is pending. 02/19/2014 -- the vascular test is scheduled for tomorrow Readmission: 06/26/2020 upon evaluation today patient appears for initial evaluation in her clinic that she has been here before in 2016 and has been quite sometime. She did have a fractured hip in February on the 23rd 2022. Subsequently this had to be pinned and she ended up with a pressure injury  on her heel following when she was using her foot to help move her around in the bed. Subsequently this has led to the wound that she has been dealing with since that time. She lives at home with her daughter currently she does have dementia. 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 07/03/2020 upon evaluation today patient appears to be doing well with regard to her heel ulcer. She did have arterial studies they  appear to be doing excellent it was premature normal across the board with TBI's in the 90s and ABIs well within normal range. Nonetheless there does not appear to be any signs of arterial insufficiency whatsoever. With that being said the patient does have also signs of improvement there is some necrotic tissue in the base of the wound number to try to clear some of that away today I do believe the Iodoflex/Iodosorb is doing well. 5/24; difficult punched out wound on the left medial heel. She has been using Iodoflex Objective Constitutional Sitting or standing Blood Pressure is within target range for patient.. Pulse regular and within target range for patient.Marland Kitchen Respirations regular, non- labored and within target range.. Temperature is normal and within the target range for the patient.Marland Kitchen appears in no distress. Vitals Time Taken: 2:35 PM, Height: 58 in, Weight: 137 lbs, BMI: 28.6, Temperature: 97.7 F, Pulse: 80 bpm, Respiratory Rate: 18 breaths/min, Blood Pressure: 129/74 mmHg. General Notes: Wound exam; unfortunately him in the clinic today with a completely nonviable surface. Difficult debridement with a #3 curette subcutaneous tissue to try and remove some of this very adherent surface material. Hemostasis with direct pressure. This has considerable depth but no palpable bone and no overt infection Integumentary (Hair, Skin) Wound #2 status is Open.  Original cause of wound was Pressure Injury. The date acquired was: 04/11/2020. The wound has been in treatment 2 weeks. The wound is located on the Left,Medial Calcaneus. The wound measures 1cm length x 1.5cm width x 0.6cm depth; 1.178cm^2 area and 0.707cm^3 volume. There is no tunneling or undermining noted. There is a medium amount of serosanguineous drainage noted. There is no granulation within the wound bed. There is a large (67-100%) amount of necrotic tissue within the wound bed including Adherent Slough. [name] [name], [name] ([id number]) Assessment Active Problems ICD-10 Pressure ulcer of left heel, stage 3 Essential (primary) hypertension Vascular dementia without behavioral disturbance Atherosclerotic heart disease of native coronary artery without angina pectoris Procedures Wound #2 Pre-procedure diagnosis of Wound #2 is a Pressure Ulcer located on the Left,Medial Calcaneus . There was a Excisional Skin/Subcutaneous Tissue Debridement with a total area of 1.5 sq cm performed by Ricard Dillon, MD. With the following instrument(s): Curette to remove Viable tissue/material. Material removed includes Subcutaneous Tissue and Slough and after achieving pain control using Lidocaine. No specimens were taken. A time out was conducted at 14:47, prior to the start of the procedure. A Moderate amount of bleeding was controlled with Pressure. The procedure was tolerated well. Post Debridement Measurements: 1cm length x 1.5cm width x 0.6cm depth; 0.707cm^3 volume. Post debridement Stage noted as Category/Stage III. Character of Wound/Ulcer Post Debridement is stable. Post procedure Diagnosis Wound #2: Same as Pre-Procedure Plan Follow-up Appointments: Return Appointment in 1 week. Bathing/ Shower/ Hygiene: May shower; gently cleanse wound with antibacterial soap, rinse and pat dry prior to dressing wounds Off-Loading: Other: - Offloading boots while in bed; Try pillow under sheet to keep it  in place. WOUND #2: - Calcaneus Wound Laterality: Left, Medial Cleanser: Normal Saline 3 x Per Week/30 Days Discharge Instructions: Wash your hands with soap and water. Remove old dressing, discard into plastic bag and place into trash. Cleanse the wound with Normal Saline prior to applying a clean dressing using gauze sponges, not tissues or cotton balls. Do not scrub or use excessive force. Pat dry using gauze sponges, not tissue or cotton balls. Primary Dressing: Iodosorb 40 (g) (Generic) 3 x Per Week/30 Days Discharge Instructions: Apply  IodoSorb to wound bed only as directed. Secondary Dressing: Mepilex Border Flex, 4x4 (in/in) (Generic) 3 x Per Week/30 Days Discharge Instructions: Apply to wound as directed. Do not cut. #1 unfortunately still aggressive debridement required. 2. I have continued the Iodoflex. Cannot rule out on ongoing mechanical debridement until we can get this wound bed a little cleaner. 3. We talked about offloading including bunny boots, sandals when she is not walking etc. Electronic Signature(s) Signed: 07/12/2020 8:39:06 AM By: Linton Ham MD Entered By: Linton Ham on 07/10/2020 15:11:31 [name] [name], [name] ([id number]) -------------------------------------------------------------------------------- SuperBill Details Patient Name: Mayers, Kmari L. Date of Service: 07/10/2020 Medical Record Number: [id number] Patient Account Number: [id number] Date of Birth/Sex: 17-Sep-1926 (87 y.o. F) Treating RN: Primary Care Provider: [name] Other Clinician: Referring Provider: [name] Treating Provider/Extender: Tito Dine in Treatment: 2 Diagnosis Coding ICD-10 Codes Code Description [phone] Pressure ulcer of left heel, stage 3 I10 Essential (primary) hypertension F01.50 Vascular dementia without behavioral disturbance I25.10 Atherosclerotic heart disease of native coronary artery without angina pectoris Facility Procedures CPT4  Code: [id number] Description: [id number] - DEB SUBQ TISSUE 20 SQ CM/< Modifier: Quantity: 1 CPT4 Code: Description: ICD-10 Diagnosis Description L89.623 Pressure ulcer of left heel, stage 3 Modifier: Quantity: Physician Procedures CPT4 Code: [id number] Description: 11042 - WC PHYS SUBQ TISS 20 SQ CM Modifier: Quantity: 1 CPT4 Code: Description: ICD-10 Diagnosis Description L89.623 Pressure ulcer of left heel, stage 3 Modifier: Quantity: Electronic Signature(s) Signed: 07/12/2020 8:39:06 AM By: Linton Ham MD Entered By: Linton Ham on 07/10/2020 15:11:45

## 2020-07-12 NOTE — Progress Notes (Signed)
Perris, Elysia L. ([id number]) Visit Report for 07/10/2020 Arrival Information Details Patient Name: Glore, LUCETTE PETRUSO. Date of Service: 07/10/2020 2:30 PM Medical Record Number: [id number] Patient Account Number: [id number] Date of Birth/Sex: [birth year] (87 y.o. F) Treating RN: Carlene Coria Primary Care Syriah Delisi: [name] Other Clinician: Referring Albina Gosney: [name] Treating Usman Millett/Extender: Tito Dine in Treatment: 2 Visit Information History Since Last Visit All ordered tests and consults were completed: No Patient Arrived: Wheel Chair Added or deleted any medications: No Arrival Time: 14:31 Any new allergies or adverse reactions: No Accompanied By: self Had a fall or experienced change in No Transfer Assistance: None activities of daily living that may affect Patient Identification Verified: Yes risk of falls: Secondary Verification Process Completed: Yes Signs or symptoms of abuse/neglect since last visito No Patient Requires Transmission-Based No Hospitalized since last visit: No Precautions: Implantable device outside of the clinic excluding No Patient Has Alerts: Yes cellular tissue based products placed in the center Patient Alerts: NOT diabetic since last visit: ABI R1.18 L1.25 Has Dressing in Place as Prescribed: Yes 06/27/20 Pain Present Now: No Electronic Signature(s) Signed: 07/11/2020 4:53:29 PM By: Carlene Coria RN Entered By: Carlene Coria on 07/10/2020 14:35:31 Soza, Jullie L. ([id number]) -------------------------------------------------------------------------------- Encounter Discharge Information Details Patient Name: Weider, Desani L. Date of Service: 07/10/2020 2:30 PM Medical Record Number: [id number] Patient Account Number: [id number] Date of Birth/Sex: 1926-11-24 (87 y.o. F) Treating RN: Cornell Barman Primary Care Maraya Gwilliam: [name] Other Clinician: Referring Jessia Kief: [name] Treating Shelba Susi/Extender:  Tito Dine in Treatment: 2 Encounter Discharge Information Items Post Procedure Vitals Discharge Condition: Stable Temperature (F): 97.7 Ambulatory Status: Wheelchair Pulse (bpm): 80 Discharge Destination: Home Respiratory Rate (breaths/min): 18 Transportation: Private Auto Blood Pressure (mmHg): 129/74 Accompanied By: self Schedule Follow-up Appointment: Yes Clinical Summary of Care: Electronic Signature(s) Signed: 07/10/2020 5:46:47 PM By: Gretta Cool, BSN, RN, CWS, Kim RN, BSN Entered By: Gretta Cool, BSN, RN, CWS, Kim on 07/10/2020 14:55:19 Fuhriman, Avila L. ([id number]) -------------------------------------------------------------------------------- Lower Extremity Assessment Details Patient Name: Borelli, Dustie L. Date of Service: 07/10/2020 2:30 PM Medical Record Number: [id number] Patient Account Number: [id number] Date of Birth/Sex: 1926-06-01 (87 y.o. F) Treating RN: Carlene Coria Primary Care Mayerli Kirst: [name] Other Clinician: Referring Dyamon Sosinski: [name] Treating Laurrie Toppin/Extender: Tito Dine in Treatment: 2 Edema Assessment Assessed: [Left: No] [Right: No] Edema: [Left: Ye] [Right: s] Calf Left: Right: Point of Measurement: 32 cm From Medial Instep 33 cm Ankle Left: Right: Point of Measurement: 12 cm From Medial Instep 26 cm Vascular Assessment Pulses: Dorsalis Pedis Palpable: [Left:Yes] Electronic Signature(s) Signed: 07/11/2020 4:53:29 PM By: Carlene Coria RN Entered By: Carlene Coria on 07/10/2020 14:43:04 Langi, Shenia L. ([id number]) -------------------------------------------------------------------------------- Multi Wound Chart Details Patient Name: Woodfield, Edlin L. Date of Service: 07/10/2020 2:30 PM Medical Record Number: [id number] Patient Account Number: [id number] Date of Birth/Sex: 09/08/1926 (87 y.o. F) Treating RN: Cornell Barman Primary Care Kinjal Neitzke: [name] Other Clinician: Referring Shams Fill: [name] Treating Kya Mayfield/Extender: Tito Dine in Treatment: 2 Vital Signs Height(in): 58 Pulse(bpm): 80 Weight(lbs): 137 Blood Pressure(mmHg): 129/74 Body Mass Index(BMI): 29 Temperature(F): 97.7 Respiratory Rate(breaths/min): 18 Photos: [N/A:N/A] Wound Location: Left, Medial Calcaneus N/A N/A Wounding Event: Pressure Injury N/A N/A Primary Etiology: Pressure Ulcer N/A N/A Comorbid History: Cataracts, Anemia, Coronary Artery N/A N/A Disease, Hypertension, Myocardial Infarction, End Stage Renal Disease, Gout, Osteoarthritis, Dementia Date Acquired: 04/11/2020 N/A N/A Weeks of Treatment: 2 N/A N/A Wound Status: Open N/A N/A Measurements L x W x D (cm) 1x1.5x0.6 N/A  N/A Area (cm) : 1.178 N/A N/A Volume (cm) : 0.707 N/A N/A % Reduction in Area: 0.00% N/A N/A % Reduction in Volume: -50.10% N/A N/A Classification: Category/Stage III N/A N/A Exudate Amount: Medium N/A N/A Exudate Type: Serosanguineous N/A N/A Exudate Color: red, brown N/A N/A Granulation Amount: None Present (0%) N/A N/A Necrotic Amount: Large (67-100%) N/A N/A Exposed Structures: Fascia: No N/A N/A Fat Layer (Subcutaneous Tissue): No Tendon: No Muscle: No Joint: No Bone: No Epithelialization: None N/A N/A Debridement: Debridement - Excisional N/A N/A Pre-procedure Verification/Time 14:47 N/A N/A Out Taken: Pain Control: Lidocaine N/A N/A Tissue Debrided: Subcutaneous, Slough N/A N/A Level: Skin/Subcutaneous Tissue N/A N/A Debridement Area (sq cm): 1.5 N/A N/A Instrument: Curette N/A N/A Bleeding: Moderate N/A N/A Hemostasis Achieved: Pressure N/A N/A Debridement Treatment Procedure was tolerated well N/A N/A Response: 1x1.5x0.6 N/A N/A Ganson, Dalana L. ([id number]) Post Debridement Measurements L x W x D (cm) Post Debridement Volume: 0.707 N/A N/A (cm) Post Debridement Stage: Category/Stage III N/A N/A Procedures Performed: Debridement N/A N/A Treatment Notes Wound #2  (Calcaneus) Wound Laterality: Left, Medial Cleanser Normal Saline Discharge Instruction: Wash your hands with soap and water. Remove old dressing, discard into plastic bag and place into trash. Cleanse the wound with Normal Saline prior to applying a clean dressing using gauze sponges, not tissues or cotton balls. Do not scrub or use excessive force. Pat dry using gauze sponges, not tissue or cotton balls. Peri-Wound Care Topical Primary Dressing Iodosorb 40 (g) Discharge Instruction: Apply IodoSorb to wound bed only as directed. Secondary Dressing Mepilex Border Flex, 4x4 (in/in) Discharge Instruction: Apply to wound as directed. Do not cut. Secured With Compression Wrap Compression Stockings Environmental education officer) Signed: 07/12/2020 8:39:06 AM By: Linton Ham MD Entered By: Linton Ham on 07/10/2020 14:57:37 Bohnenkamp, Somaly L. ([id number]) -------------------------------------------------------------------------------- [place] Details Patient Name: Mccaskill, Lorma L. Date of Service: 07/10/2020 2:30 PM Medical Record Number: [id number] Patient Account Number: [id number] Date of Birth/Sex: 1926/12/23 (87 y.o. F) Treating RN: Cornell Barman Primary Care Omarian Jaquith: [name] Other Clinician: Referring Aashka Salomone: [name] Treating Lawanna Cecere/Extender: Tito Dine in Treatment: 2 Active Inactive Wound/Skin Impairment Nursing Diagnoses: Impaired tissue integrity Goals: Patient/caregiver will verbalize understanding of skin care regimen Date Initiated: 06/26/2020 Target Resolution Date: 06/26/2020 Goal Status: Active Ulcer/skin breakdown will have a volume reduction of 30% by week 4 Date Initiated: 06/26/2020 Target Resolution Date: 07/27/2020 Goal Status: Active Ulcer/skin breakdown will have a volume reduction of 50% by week 8 Date Initiated: 06/26/2020 Target Resolution Date: 08/26/2020 Goal Status: Active Ulcer/skin breakdown  will have a volume reduction of 80% by week 12 Date Initiated: 06/26/2020 Target Resolution Date: 09/26/2020 Goal Status: Active Ulcer/skin breakdown will heal within 14 weeks Date Initiated: 06/26/2020 Target Resolution Date: 10/27/2020 Goal Status: Active Interventions: Assess patient/caregiver ability to obtain necessary supplies Assess patient/caregiver ability to perform ulcer/skin care regimen upon admission and as needed Assess ulceration(s) every visit Provide education on ulcer and skin care Treatment Activities: Referred to DME Henlee Donovan for dressing supplies : 06/26/2020 Skin care regimen initiated : 06/26/2020 Notes: Electronic Signature(s) Signed: 07/10/2020 5:46:47 PM By: Gretta Cool, BSN, RN, CWS, Kim RN, BSN Entered By: Gretta Cool, BSN, RN, CWS, Kim on 07/10/2020 14:47:12 Bhakta, [name] ([id number]) -------------------------------------------------------------------------------- Pain Assessment Details Patient Name: Bradner, Syniyah L. Date of Service: 07/10/2020 2:30 PM Medical Record Number: [id number] Patient Account Number: [id number] Date of Birth/Sex: 1926/06/04 (87 y.o. F) Treating RN: Carlene Coria Primary Care Eashan Schipani: [name] Other Clinician: Referring Ajit Errico: [name]  Treating Erinn Mendosa/Extender: Ricard Dillon Weeks in Treatment: 2 Active Problems Location of Pain Severity and Description of Pain Patient Has Paino No Site Locations Pain Management and Medication Current Pain Management: Electronic Signature(s) Signed: 07/11/2020 4:53:29 PM By: Carlene Coria RN Entered By: Carlene Coria on 07/10/2020 14:35:56 Worton, Caidyn L. ([id number]) -------------------------------------------------------------------------------- Patient/Caregiver Education Details Patient Name: Lobban, Keina L. Date of Service: 07/10/2020 2:30 PM Medical Record Number: [id number] Patient Account Number: [id number] Date of Birth/Gender: 08/29/1926 (87 y.o. F) Treating RN:  Cornell Barman Primary Care Physician: [name] Other Clinician: Referring Physician: [name] Treating Physician/Extender: Tito Dine in Treatment: 2 Education Assessment Education Provided To: Patient Education Topics Provided Wound Debridement: Handouts: Wound Debridement Methods: Demonstration, Explain/Verbal Responses: State content correctly Wound/Skin Impairment: Handouts: Caring for Your Ulcer, Caring for Your Ulcer Spanish, Other: wound care as prescribed Methods: Demonstration Responses: State content correctly Electronic Signature(s) Signed: 07/10/2020 5:46:47 PM By: Gretta Cool, BSN, RN, CWS, Kim RN, BSN Entered By: Gretta Cool, BSN, RN, CWS, Kim on 07/10/2020 14:52:15 Foell, Beula L. ([id number]) -------------------------------------------------------------------------------- Wound Assessment Details Patient Name: Birnbaum, Alysabeth L. Date of Service: 07/10/2020 2:30 PM Medical Record Number: [id number] Patient Account Number: [id number] Date of Birth/Sex: [birth year] (87 y.o. F) Treating RN: Carlene Coria Primary Care Fatema Rabe: [name] Other Clinician: Referring Miyo Aina: [name] Treating Mazi Schuff/Extender: Tito Dine in Treatment: 2 Wound Status Wound Number: 2 Primary Pressure Ulcer Etiology: Wound Location: Left, Medial Calcaneus Wound Open Wounding Event: Pressure Injury Status: Date Acquired: 04/11/2020 Comorbid Cataracts, Anemia, Coronary Artery Disease, Weeks Of Treatment: 2 History: Hypertension, Myocardial Infarction, End Stage Renal Clustered Wound: No Disease, Gout, Osteoarthritis, Dementia Photos Wound Measurements Length: (cm) 1 Width: (cm) 1.5 Depth: (cm) 0.6 Area: (cm) 1.178 Volume: (cm) 0.707 % Reduction in Area: 0% % Reduction in Volume: -50.1% Epithelialization: None Tunneling: No Undermining: No Wound Description Classification: Category/Stage III Exudate Amount: Medium Exudate Type:  Serosanguineous Exudate Color: red, brown Foul Odor After Cleansing: No Slough/Fibrino Yes Wound Bed Granulation Amount: None Present (0%) Exposed Structure Necrotic Amount: Large (67-100%) Fascia Exposed: No Necrotic Quality: Adherent Slough Fat Layer (Subcutaneous Tissue) Exposed: No Tendon Exposed: No Muscle Exposed: No Joint Exposed: No Bone Exposed: No Treatment Notes Wound #2 (Calcaneus) Wound Laterality: Left, Medial Cleanser Normal Saline Discharge Instruction: Wash your hands with soap and water. Remove old dressing, discard into plastic bag and place into trash. Cleanse the wound with Normal Saline prior to applying a clean dressing using gauze sponges, not tissues or cotton balls. Do not scrub or use excessive force. Pat dry using gauze sponges, not tissue or cotton balls. Hubble, Zenda L. ([id number]) Peri-Wound Care Topical Primary Dressing Iodosorb 40 (g) Discharge Instruction: Apply IodoSorb to wound bed only as directed. Secondary Dressing Mepilex Border Flex, 4x4 (in/in) Discharge Instruction: Apply to wound as directed. Do not cut. Secured With Compression Wrap Compression Stockings Add-Ons Electronic Signature(s) Signed: 07/11/2020 4:53:29 PM By: Carlene Coria RN Entered By: Carlene Coria on 07/10/2020 14:41:51 Pickard, Aneesa L. ([id number]) -------------------------------------------------------------------------------- Vitals Details Patient Name: Guarino, Hermela L. Date of Service: 07/10/2020 2:30 PM Medical Record Number: [id number] Patient Account Number: [id number] Date of Birth/Sex: 10-Jul-1926 (87 y.o. F) Treating RN: Carlene Coria Primary Care Arria Naim: [name] Other Clinician: Referring Jedediah Noda: [name] Treating Floyde Dingley/Extender: Tito Dine in Treatment: 2 Vital Signs Time Taken: 14:35 Temperature (F): 97.7 Height (in): 58 Pulse (bpm): 80 Weight (lbs): 137 Respiratory Rate (breaths/min): 18 Body Mass Index  (BMI): 28.6 Blood Pressure (mmHg): 129/74 Reference Range: 80 -  120 mg / dl Electronic Signature(s) Signed: 07/11/2020 4:53:29 PM By: Carlene Coria RN Entered By: Carlene Coria on 07/10/2020 14:35:49

## 2020-07-17 ENCOUNTER — Encounter: Payer: PPO | Admitting: Physician Assistant

## 2020-07-17 ENCOUNTER — Other Ambulatory Visit: Payer: Self-pay

## 2020-07-17 DIAGNOSIS — L89623 Pressure ulcer of left heel, stage 3: Secondary | ICD-10-CM | POA: Diagnosis not present

## 2020-07-17 NOTE — Progress Notes (Signed)
Gresham, Jannie L. ([id number]) Visit Report for 07/17/2020 Arrival Information Details Patient Name: Mata, AKIYA KADRMAS. Date of Service: 07/17/2020 11:15 AM Medical Record Number: [id number] Patient Account Number: [id number] Date of Birth/Sex: 1926/05/06 (87 y.o. F) Treating RN: Carlene Coria Primary Care Caniyah Murley: [name] Other Clinician: Referring Rhya Shan: [name] Treating Corita Allinson/Extender: Skipper Cliche in Treatment: 3 Visit Information History Since Last Visit All ordered tests and consults were completed: No Patient Arrived: Wheel Chair Added or deleted any medications: No Arrival Time: 11:30 Any new allergies or adverse reactions: No Accompanied By: daughter Had a fall or experienced change in No Transfer Assistance: None activities of daily living that may affect Patient Identification Verified: Yes risk of falls: Secondary Verification Process Completed: Yes Signs or symptoms of abuse/neglect since last visito No Patient Requires Transmission-Based No Hospitalized since last visit: No Precautions: Implantable device outside of the clinic excluding No Patient Has Alerts: Yes cellular tissue based products placed in the center Patient Alerts: NOT diabetic since last visit: ABI R1.18 L1.25 Has Dressing in Place as Prescribed: Yes 06/27/20 Pain Present Now: No Electronic Signature(s) Signed: 07/17/2020 3:50:09 PM By: Carlene Coria RN Entered By: Carlene Coria on 07/17/2020 11:35:38 Manders, Deniece L. ([id number]) -------------------------------------------------------------------------------- Clinic Level of Care Assessment Details Patient Name: Conway, Aiana L. Date of Service: 07/17/2020 11:15 AM Medical Record Number: [id number] Patient Account Number: [id number] Date of Birth/Sex: [birth year] (87 y.o. F) Treating RN: Dolan Amen Primary Care Jaida Basurto: [name] Other Clinician: Referring Shareece Bultman: [name] Treating Millicent Blazejewski/Extender:  Skipper Cliche in Treatment: 3 Clinic Level of Care Assessment Items TOOL 1 Quantity Score '[]'$  - Use when EandM and Procedure is performed on INITIAL visit 0 ASSESSMENTS - Nursing Assessment / Reassessment '[]'$  - General Physical Exam (combine w/ comprehensive assessment (listed just below) when performed on new 0 pt. evals) '[]'$  - 0 Comprehensive Assessment (HX, ROS, Risk Assessments, Wounds Hx, etc.) ASSESSMENTS - Wound and Skin Assessment / Reassessment '[]'$  - Dermatologic / Skin Assessment (not related to wound area) 0 ASSESSMENTS - Ostomy and/or Continence Assessment and Care '[]'$  - Incontinence Assessment and Management 0 '[]'$  - 0 Ostomy Care Assessment and Management (repouching, etc.) PROCESS - Coordination of Care '[]'$  - Simple Patient / Family Education for ongoing care 0 '[]'$  - 0 Complex (extensive) Patient / Family Education for ongoing care '[]'$  - 0 Staff obtains Programmer, systems, Records, Test Results / Process Orders '[]'$  - 0 Staff telephones HHA, Nursing Homes / Clarify orders / etc '[]'$  - 0 Routine Transfer to another Facility (non-emergent condition) '[]'$  - 0 Routine Hospital Admission (non-emergent condition) '[]'$  - 0 New Admissions / Biomedical engineer / Ordering NPWT, Apligraf, etc. '[]'$  - 0 Emergency Hospital Admission (emergent condition) PROCESS - Special Needs '[]'$  - Pediatric / Minor Patient Management 0 '[]'$  - 0 Isolation Patient Management '[]'$  - 0 Hearing / Language / Visual special needs '[]'$  - 0 Assessment of Community assistance (transportation, D/C planning, etc.) '[]'$  - 0 Additional assistance / Altered mentation '[]'$  - 0 Support Surface(s) Assessment (bed, cushion, seat, etc.) INTERVENTIONS - Miscellaneous '[]'$  - External ear exam 0 '[]'$  - 0 Patient Transfer (multiple staff / Civil Service fast streamer / Similar devices) '[]'$  - 0 Simple Staple / Suture removal (25 or less) '[]'$  - 0 Complex Staple / Suture removal (26 or more) '[]'$  - 0 Hypo/Hyperglycemic Management (do not check if billed  separately) '[]'$  - 0 Ankle / Brachial Index (ABI) - do not check if billed separately Has the patient been seen at the hospital within  the last three years: Yes Total Score: 0 Level Of Care: ____ Sergent, Latori L. ([id number]) Electronic Signature(s) Signed: 07/17/2020 4:43:03 PM By: Georges Mouse, Minus Breeding RN Entered By: Georges Mouse, Minus Breeding on 07/17/2020 12:15:28 Sthilaire, Bartow ([id number]) -------------------------------------------------------------------------------- Encounter Discharge Information Details Patient Name: Gunning, Eileene L. Date of Service: 07/17/2020 11:15 AM Medical Record Number: [id number] Patient Account Number: [id number] Date of Birth/Sex: 1926-04-05 (87 y.o. F) Treating RN: Donnamarie Poag Primary Care Juma Oxley: [name] Other Clinician: Referring Sila Sarsfield: [name] Treating Laynee Lockamy/Extender: Skipper Cliche in Treatment: 3 Encounter Discharge Information Items Post Procedure Vitals Discharge Condition: Stable Temperature (F): 97.8 Ambulatory Status: Wheelchair Pulse (bpm): 87 Discharge Destination: Home Respiratory Rate (breaths/min): 18 Transportation: Private Auto Blood Pressure (mmHg): 159/73 Accompanied By: daughter Schedule Follow-up Appointment: Yes Clinical Summary of Care: Electronic Signature(s) Signed: 07/17/2020 1:46:03 PM By: Donnamarie Poag Entered ByDonnamarie Poag on 07/17/2020 12:22:23 Bruington, Rochelle L. ([id number]) -------------------------------------------------------------------------------- Lower Extremity Assessment Details Patient Name: Luckenbaugh, Loukisha L. Date of Service: 07/17/2020 11:15 AM Medical Record Number: [id number] Patient Account Number: [id number] Date of Birth/Sex: [birth year] (87 y.o. F) Treating RN: Carlene Coria Primary Care Colby Catanese: [name] Other Clinician: Referring Calvyn Kurtzman: [name] Treating Paizleigh Wilds/Extender: Skipper Cliche in Treatment: 3 Edema Assessment Assessed: [Left: No]  [Right: No] Edema: [Left: Ye] [Right: s] Calf Left: Right: Point of Measurement: 32 cm From Medial Instep 34 cm Ankle Left: Right: Point of Measurement: 12 cm From Medial Instep 26 cm Vascular Assessment Pulses: Dorsalis Pedis Palpable: [Left:Yes] Electronic Signature(s) Signed: 07/17/2020 3:50:09 PM By: Carlene Coria RN Entered By: Carlene Coria on 07/17/2020 11:44:13 Stirn, Jennelle L. ([id number]) -------------------------------------------------------------------------------- Multi Wound Chart Details Patient Name: Jaskowiak, Sahvannah L. Date of Service: 07/17/2020 11:15 AM Medical Record Number: [id number] Patient Account Number: [id number] Date of Birth/Sex: Aug 09, 1926 (87 y.o. F) Treating RN: Dolan Amen Primary Care Vedha Tercero: [name] Other Clinician: Referring Ellah Otte: [name] Treating Juliannah Ohmann/Extender: Skipper Cliche in Treatment: 3 Vital Signs Height(in): 58 Pulse(bpm): 64 Weight(lbs): 137 Blood Pressure(mmHg): 159/73 Body Mass Index(BMI): 29 Temperature(F): 98.4 Respiratory Rate(breaths/min): 18 Photos: [N/A:N/A] Wound Location: Left, Medial Calcaneus N/A N/A Wounding Event: Pressure Injury N/A N/A Primary Etiology: Pressure Ulcer N/A N/A Comorbid History: Cataracts, Anemia, Coronary Artery N/A N/A Disease, Hypertension, Myocardial Infarction, End Stage Renal Disease, Gout, Osteoarthritis, Dementia Date Acquired: 04/11/2020 N/A N/A Weeks of Treatment: 3 N/A N/A Wound Status: Open N/A N/A Measurements L x W x D (cm) 1.5x1.5x1 N/A N/A Area (cm) : 1.767 N/A N/A Volume (cm) : 1.767 N/A N/A % Reduction in Area: -50.00% N/A N/A % Reduction in Volume: -275.20% N/A N/A Classification: Category/Stage III N/A N/A Exudate Amount: Medium N/A N/A Exudate Type: Serosanguineous N/A N/A Exudate Color: red, brown N/A N/A Granulation Amount: Medium (34-66%) N/A N/A Necrotic Amount: Medium (34-66%) N/A N/A Exposed Structures: Fat Layer (Subcutaneous  Tissue): N/A N/A Yes Fascia: No Tendon: No Muscle: No Joint: No Bone: No Epithelialization: None N/A N/A Treatment Notes Electronic Signature(s) Signed: 07/17/2020 4:43:03 PM By: Georges Mouse, Minus Breeding RN Entered By: Georges Mouse, Minus Breeding on 07/17/2020 12:10:31 Degraff, Geovanna L. ([id number]) -------------------------------------------------------------------------------- [place] Details Patient Name: Zink, Reianna L. Date of Service: 07/17/2020 11:15 AM Medical Record Number: [id number] Patient Account Number: [id number] Date of Birth/Sex: 08/10/1926 (87 y.o. F) Treating RN: Dolan Amen Primary Care Marvis Bakken: [name] Other Clinician: Referring Lareta Bruneau: [name] Treating Azan Maneri/Extender: Skipper Cliche in Treatment: 3 Active Inactive Wound/Skin Impairment Nursing Diagnoses: Impaired tissue integrity Goals: Patient/caregiver will verbalize understanding of skin care regimen Date Initiated:  06/26/2020 Target Resolution Date: 06/26/2020 Goal Status: Active Ulcer/skin breakdown will have a volume reduction of 30% by week 4 Date Initiated: 06/26/2020 Target Resolution Date: 07/27/2020 Goal Status: Active Ulcer/skin breakdown will have a volume reduction of 50% by week 8 Date Initiated: 06/26/2020 Target Resolution Date: 08/26/2020 Goal Status: Active Ulcer/skin breakdown will have a volume reduction of 80% by week 12 Date Initiated: 06/26/2020 Target Resolution Date: 09/26/2020 Goal Status: Active Ulcer/skin breakdown will heal within 14 weeks Date Initiated: 06/26/2020 Target Resolution Date: 10/27/2020 Goal Status: Active Interventions: Assess patient/caregiver ability to obtain necessary supplies Assess patient/caregiver ability to perform ulcer/skin care regimen upon admission and as needed Assess ulceration(s) every visit Provide education on ulcer and skin care Treatment Activities: Referred to DME Jiaire Rosebrook for dressing supplies :  06/26/2020 Skin care regimen initiated : 06/26/2020 Notes: Electronic Signature(s) Signed: 07/17/2020 4:43:03 PM By: Georges Mouse, Minus Breeding RN Entered By: Georges Mouse, Minus Breeding on 07/17/2020 12:10:19 Vannatter, Zariya L. ([id number]) -------------------------------------------------------------------------------- Pain Assessment Details Patient Name: Kerstetter, Ori L. Date of Service: 07/17/2020 11:15 AM Medical Record Number: [id number] Patient Account Number: [id number] Date of Birth/Sex: Mar 01, 1926 (87 y.o. F) Treating RN: Carlene Coria Primary Care Eavan Gonterman: [name] Other Clinician: Referring Donasia Wimes: [name] Treating Ayomide Zuleta/Extender: Skipper Cliche in Treatment: 3 Active Problems Location of Pain Severity and Description of Pain Patient Has Paino No Site Locations Pain Management and Medication Current Pain Management: Electronic Signature(s) Signed: 07/17/2020 3:50:09 PM By: Carlene Coria RN Entered By: Carlene Coria on 07/17/2020 11:36:05 Meisenheimer, Jadi L. ([id number]) -------------------------------------------------------------------------------- Patient/Caregiver Education Details Patient Name: Mccolm, Marika L. Date of Service: 07/17/2020 11:15 AM Medical Record Number: [id number] Patient Account Number: [id number] Date of Birth/Gender: [birth year] (87 y.o. F) Treating RN: Dolan Amen Primary Care Physician: [name] Other Clinician: Referring Physician: [name] Treating Physician/Extender: Skipper Cliche in Treatment: 3 Education Assessment Education Provided To: Patient Education Topics Provided Wound/Skin Impairment: Methods: Explain/Verbal Responses: State content correctly Electronic Signature(s) Signed: 07/17/2020 4:43:03 PM By: Georges Mouse, Minus Breeding RN Entered By: Georges Mouse, Minus Breeding on 07/17/2020 12:15:42 Voth, Marceil L.  ([id number]) -------------------------------------------------------------------------------- Wound Assessment Details Patient Name: Delsol, Tyniya L. Date of Service: 07/17/2020 11:15 AM Medical Record Number: [id number] Patient Account Number: [id number] Date of Birth/Sex: 18-Jan-1927 (87 y.o. F) Treating RN: Carlene Coria Primary Care Khori Rosevear: [name] Other Clinician: Referring Starling Jessie: [name] Treating Treavon Castilleja/Extender: Skipper Cliche in Treatment: 3 Wound Status Wound Number: 2 Primary Pressure Ulcer Etiology: Wound Location: Left, Medial Calcaneus Wound Open Wounding Event: Pressure Injury Status: Date Acquired: 04/11/2020 Comorbid Cataracts, Anemia, Coronary Artery Disease, Weeks Of Treatment: 3 History: Hypertension, Myocardial Infarction, End Stage Renal Clustered Wound: No Disease, Gout, Osteoarthritis, Dementia Photos Wound Measurements Length: (cm) 1.5 Width: (cm) 1.5 Depth: (cm) 1 Area: (cm) 1.767 Volume: (cm) 1.767 % Reduction in Area: -50% % Reduction in Volume: -275.2% Epithelialization: None Tunneling: No Undermining: No Wound Description Classification: Category/Stage III Exudate Amount: Medium Exudate Type: Serosanguineous Exudate Color: red, brown Foul Odor After Cleansing: No Slough/Fibrino Yes Wound Bed Granulation Amount: Medium (34-66%) Exposed Structure Necrotic Amount: Medium (34-66%) Fascia Exposed: No Necrotic Quality: Adherent Slough Fat Layer (Subcutaneous Tissue) Exposed: Yes Tendon Exposed: No Muscle Exposed: No Joint Exposed: No Bone Exposed: No Treatment Notes Wound #2 (Calcaneus) Wound Laterality: Left, Medial Cleanser Normal Saline Discharge Instruction: Wash your hands with soap and water. Remove old dressing, discard into plastic bag and place into trash. Cleanse the wound with Normal Saline prior to applying a clean dressing using gauze sponges, not tissues or  cotton balls. Do not scrub or use excessive  force. Pat dry using gauze sponges, not tissue or cotton balls. Wieber, Leyton L. ([id number]) Peri-Wound Care Topical Primary Dressing Iodosorb 40 (g) Discharge Instruction: Apply IodoSorb to wound bed only as directed. Secondary Dressing Mepilex Border Flex, 4x4 (in/in) Discharge Instruction: Apply to wound as directed. Do not cut. Secured With Compression Wrap Compression Stockings Environmental education officer) Signed: 07/17/2020 3:50:09 PM By: Carlene Coria RN Entered By: Carlene Coria on 07/17/2020 11:42:51 Urquilla, Jaidin L. ([id number]) -------------------------------------------------------------------------------- Vitals Details Patient Name: Haughey, Mikaella L. Date of Service: 07/17/2020 11:15 AM Medical Record Number: [id number] Patient Account Number: [id number] Date of Birth/Sex: Apr 03, 1926 (87 y.o. F) Treating RN: Carlene Coria Primary Care Kyrie Fludd: [name] Other Clinician: Referring Kadien Lineman: [name] Treating Samaya Boardley/Extender: Skipper Cliche in Treatment: 3 Vital Signs Time Taken: 11:35 Temperature (F): 98.4 Height (in): 58 Pulse (bpm): 87 Weight (lbs): 137 Respiratory Rate (breaths/min): 18 Body Mass Index (BMI): 28.6 Blood Pressure (mmHg): 159/73 Reference Range: 80 - 120 mg / dl Electronic Signature(s) Signed: 07/17/2020 3:50:09 PM By: Carlene Coria RN Entered By: Carlene Coria on 07/17/2020 11:35:58

## 2020-07-17 NOTE — Progress Notes (Addendum)
Doolan, Emry L. ([id number]) Visit Report for 07/17/2020 Chief Complaint Document Details Patient Name: Falzone, [name] Date of Service: 07/17/2020 11:15 AM Medical Record Number: [id number] Patient Account Number: [id number] Date of Birth/Sex: 06/18/1926 (87 y.o. F) Treating RN: Dolan Amen Primary Care Provider: [name] Other Clinician: Referring Provider: [name] Treating Provider/Extender: Skipper Cliche in Treatment: 3 Information Obtained from: Patient Chief Complaint Left Heel Pressure Ulcer Electronic Signature(s) Signed: 07/17/2020 12:07:48 PM By: Worthy Keeler PA-C Entered By: Worthy Keeler on 07/17/2020 12:07:48 [name], Quintella L. ([id number]) -------------------------------------------------------------------------------- Debridement Details Patient Name: Nazaire, Emree L. Date of Service: 07/17/2020 11:15 AM Medical Record Number: [id number] Patient Account Number: [id number] Date of Birth/Sex: [birth year] (87 y.o. F) Treating RN: Dolan Amen Primary Care Provider: [name] Other Clinician: Referring Provider: [name] Treating Provider/Extender: Skipper Cliche in Treatment: 3 Debridement Performed for Wound #2 Left,Medial Calcaneus Assessment: Performed By: Physician Tommie Sams., PA-C Debridement Type: Debridement Level of Consciousness (Pre- Awake and Alert procedure): Pre-procedure Verification/Time Out Yes - 12:11 Taken: Start Time: 12:11 Total Area Debrided (L x W): 1.5 (cm) x 1.5 (cm) = 2.25 (cm) Tissue and other material Viable, Non-Viable, Callus, Slough, Subcutaneous, Slough debrided: Level: Skin/Subcutaneous Tissue Debridement Description: Excisional Instrument: Curette Bleeding: Minimum Hemostasis Achieved: Pressure Response to Treatment: Procedure was tolerated well Level of Consciousness (Post- Awake and Alert procedure): Post Debridement Measurements of Total Wound Length: (cm) 1.5 Stage:  Category/Stage III Width: (cm) 1.5 Depth: (cm) 1.1 Volume: (cm) 1.944 Character of Wound/Ulcer Post Debridement: Stable Post Procedure Diagnosis Same as Pre-procedure Electronic Signature(s) Signed: 07/17/2020 4:43:03 PM By: Charlett Nose RN Signed: 07/17/2020 5:49:10 PM By: Worthy Keeler PA-C Entered By: Georges Mouse, Minus Breeding on 07/17/2020 12:15:01 Boettger, Darius L. ([id number]) -------------------------------------------------------------------------------- HPI Details Patient Name: Kingsbury, Ninoska L. Date of Service: 07/17/2020 11:15 AM Medical Record Number: [id number] Patient Account Number: [id number] Date of Birth/Sex: 1926-11-17 (87 y.o. F) Treating RN: Dolan Amen Primary Care Provider: [name] Other Clinician: Referring Provider: [name] Treating Provider/Extender: Skipper Cliche in Treatment: 3 History of Present Illness HPI Description: 87 year old patient was recently seen by the PCPs office for significant pain right great toe which has been going on since July. She was initially treated with Keflex which she did not complete and after the office visit this time she has been put on doxycycline. at the time of her visit she was found to have a ulcer on the plantar surface of the right great toe and also had a pyogenic granuloma over this area. X-ray of the right foot done 12/29/2014 -- IMPRESSION:Soft-tissue swelling and ulceration right great toe. No underlying bony lytic lesion identified. If osteomyelitis remains of clinical concern MRI can be obtained. Past medical history significant for anemia, chronic kidney disease stage III, obesity, varicose veins, coronary artery disease, gout, history of nicotine addiction given up smoking in 2002, hypertension, status post cardiac catheterization, status post abdominal hysterectomy, cholecystectomy and tonsillectomy. hemoglobin A1c done in August was 5.8 01/22/2015 -- at this stage the [facility] Walking  boat was going to cost them significant amount of money and they want to defer using that at the present time. 01/29/2015 -- she had a podiatry appointment and they have trimmed her toenails. She has not heard back from the vascular office regarding her venous duplex study and I have asked them to call personally so that they can get the appointment soon. 02/06/2015 -- they have made contact with the vascular office and from what I  understand that test has been done but the report is pending. 02/19/2014 -- the vascular test is scheduled for tomorrow Readmission: 06/26/2020 upon evaluation today patient appears for initial evaluation in her clinic that she has been here before in 2016 and has been quite sometime. She did have a fractured hip in February on the 23rd 2022. Subsequently this had to be pinned and she ended up with a pressure injury on her heel following when she was using her foot to help move her around in the bed. Subsequently this has led to the wound that she has been dealing with since that time. She lives at home with her daughter currently she does have dementia. 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 07/03/2020 upon evaluation today patient appears to be doing well with regard to her heel ulcer. She did have arterial studies they appear to be doing excellent it was premature normal across the board with TBI's in the 90s and ABIs well within normal range. Nonetheless there does not appear to be any signs of arterial insufficiency whatsoever. With that being said the patient does have also signs of improvement there is some necrotic tissue in the base of the wound number to try to clear some of that away today I do believe the Iodoflex/Iodosorb is doing well. 5/24; difficult punched out wound on the left medial heel. She has been using  Iodoflex 07/17/2020 upon evaluation today patient appears to be doing well at this point in regard to her wound. I do feel like this is a little bit deeper but again that is what is expected as we continue with the Iodoflex I think that this is just going to get deeper until we get to the base of the wound. With that being said I think we are getting much closer to the base of the wound where we can have healthier tissue that we get a be managing here which that will be awesome. In the meantime I am not surprised by what I am seeing and in fact the wound appears to be better as compared to previous findings. Electronic Signature(s) Signed: 07/17/2020 5:34:39 PM By: Worthy Keeler PA-C Entered By: Worthy Keeler on 07/17/2020 17:34:39 Wertenberger, Martie L. ([id number]) -------------------------------------------------------------------------------- Physical Exam Details Patient Name: Otten, Fontaine L. Date of Service: 07/17/2020 11:15 AM Medical Record Number: [id number] Patient Account Number: [id number] Date of Birth/Sex: 04-Jul-1926 (87 y.o. F) Treating RN: Dolan Amen Primary Care Provider: [name] Other Clinician: Referring Provider: [name] Treating Provider/Extender: Skipper Cliche in Treatment: 3 Constitutional Well-nourished and well-hydrated in no acute distress. Respiratory normal breathing without difficulty. Psychiatric this patient is able to make decisions and demonstrates good insight into disease process. Alert and Oriented x 3. pleasant and cooperative. Notes Upon inspection patient's wound actually did require some sharp debridement to clear away some of the necrotic debris and slough. She tolerated the debridement for the most part without complication. Post debridement the wound bed appears to be doing much better. With that being said this is something that to be honest is still going to require additional debridement and I think the Iodoflex is the way  to go. Electronic Signature(s) Signed: 07/17/2020 5:35:23 PM By: Worthy Keeler PA-C Entered By: Worthy Keeler on 07/17/2020 17:35:23 Wenz, Danean L. ([id number]) -------------------------------------------------------------------------------- Physician Orders Details Patient Name: Arnall, Mariapaula L. Date of Service: 07/17/2020 11:15  AM Medical Record Number: [id number] Patient Account Number: [id number] Date of Birth/Sex: 27-Jun-1926 (87 y.o. F) Treating RN: Dolan Amen Primary Care Provider: [name] Other Clinician: Referring Provider: [name] Treating Provider/Extender: Skipper Cliche in Treatment: 3 Verbal / Phone Orders: No Diagnosis Coding ICD-10 Coding Code Description [phone] Pressure ulcer of left heel, stage 3 I10 Essential (primary) hypertension F01.50 Vascular dementia without behavioral disturbance I25.10 Atherosclerotic heart disease of native coronary artery without angina pectoris Follow-up Appointments o Return Appointment in 1 week. Bathing/ Shower/ Hygiene o May shower; gently cleanse wound with antibacterial soap, rinse and pat dry prior to dressing wounds Off-Loading o Other: - Offloading boots while in bed; Try pillow under sheet to keep it in place. Wound Treatment Wound #2 - Calcaneus Wound Laterality: Left, Medial Cleanser: Normal Saline 3 x Per Week/30 Days Discharge Instructions: Wash your hands with soap and water. Remove old dressing, discard into plastic bag and place into trash. Cleanse the wound with Normal Saline prior to applying a clean dressing using gauze sponges, not tissues or cotton balls. Do not scrub or use excessive force. Pat dry using gauze sponges, not tissue or cotton balls. Primary Dressing: Iodosorb 40 (g) (Generic) 3 x Per Week/30 Days Discharge Instructions: Apply IodoSorb to wound bed only as directed. Secondary Dressing: Mepilex Border Flex, 4x4 (in/in) (Generic) 3 x Per Week/30 Days Discharge  Instructions: Apply to wound as directed. Do not cut. Electronic Signature(s) Signed: 07/17/2020 4:43:03 PM By: Georges Mouse, Minus Breeding RN Signed: 07/17/2020 5:49:10 PM By: Worthy Keeler PA-C Entered By: Georges Mouse, Minus Breeding on 07/17/2020 12:15:16 Everhart, Kinslei L. ([id number]) -------------------------------------------------------------------------------- Problem List Details Patient Name: Dada, Breckin L. Date of Service: 07/17/2020 11:15 AM Medical Record Number: [id number] Patient Account Number: [id number] Date of Birth/Sex: Mar 26, 1926 (87 y.o. F) Treating RN: Dolan Amen Primary Care Provider: [name] Other Clinician: Referring Provider: [name] Treating Provider/Extender: Skipper Cliche in Treatment: 3 Active Problems ICD-10 Encounter Code Description Active Date MDM Diagnosis [phone] Pressure ulcer of left heel, stage 3 06/26/2020 No Yes I10 Essential (primary) hypertension 06/26/2020 No Yes F01.50 Vascular dementia without behavioral disturbance 06/26/2020 No Yes I25.10 Atherosclerotic heart disease of native coronary artery without angina 06/26/2020 No Yes pectoris Inactive Problems Resolved Problems Electronic Signature(s) Signed: 07/17/2020 12:07:41 PM By: Worthy Keeler PA-C Entered By: Worthy Keeler on 07/17/2020 12:07:41 Tsui, Devynne L. ([id number]) -------------------------------------------------------------------------------- Progress Note Details Patient Name: Russom, Davia L. Date of Service: 07/17/2020 11:15 AM Medical Record Number: [id number] Patient Account Number: [id number] Date of Birth/Sex: July 26, 1926 (87 y.o. F) Treating RN: Dolan Amen Primary Care Provider: [name] Other Clinician: Referring Provider: [name] Treating Provider/Extender: Skipper Cliche in Treatment: 3 Subjective Chief Complaint Information obtained from Patient Left Heel Pressure Ulcer History of Present Illness (HPI) 87 year old  patient was recently seen by the PCPs office for significant pain right great toe which has been going on since July. She was initially treated with Keflex which she did not complete and after the office visit this time she has been put on doxycycline. at the time of her visit she was found to have a ulcer on the plantar surface of the right great toe and also had a pyogenic granuloma over this area. X-ray of the right foot done 12/29/2014 -- IMPRESSION:Soft-tissue swelling and ulceration right great toe. No underlying bony lytic lesion identified. If osteomyelitis remains of clinical concern MRI can be obtained. Past medical history significant for anemia, chronic kidney disease stage III, obesity, varicose veins,  coronary artery disease, gout, history of nicotine addiction given up smoking in 2002, hypertension, status post cardiac catheterization, status post abdominal hysterectomy, cholecystectomy and tonsillectomy. hemoglobin A1c done in August was 5.8 01/22/2015 -- at this stage the [facility] Walking boat was going to cost them significant amount of money and they want to defer using that at the present time. 01/29/2015 -- she had a podiatry appointment and they have trimmed her toenails. She has not heard back from the vascular office regarding her venous duplex study and I have asked them to call personally so that they can get the appointment soon. 02/06/2015 -- they have made contact with the vascular office and from what I understand that test has been done but the report is pending. 02/19/2014 -- the vascular test is scheduled for tomorrow Readmission: 06/26/2020 upon evaluation today patient appears for initial evaluation in her clinic that she has been here before in 2016 and has been quite sometime. She did have a fractured hip in February on the 23rd 2022. Subsequently this had to be pinned and she ended up with a pressure injury on her heel following when she was using her foot to help  move her around in the bed. Subsequently this has led to the wound that she has been dealing with since that time. She lives at home with her daughter currently she does have dementia. 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 07/03/2020 upon evaluation today patient appears to be doing well with regard to her heel ulcer. She did have arterial studies they appear to be doing excellent it was premature normal across the board with TBI's in the 90s and ABIs well within normal range. Nonetheless there does not appear to be any signs of arterial insufficiency whatsoever. With that being said the patient does have also signs of improvement there is some necrotic tissue in the base of the wound number to try to clear some of that away today I do believe the Iodoflex/Iodosorb is doing well. 5/24; difficult punched out wound on the left medial heel. She has been using Iodoflex 07/17/2020 upon evaluation today patient appears to be doing well at this point in regard to her wound. I do feel like this is a little bit deeper but again that is what is expected as we continue with the Iodoflex I think that this is just going to get deeper until we get to the base of the wound. With that being said I think we are getting much closer to the base of the wound where we can have healthier tissue that we get a be managing here which that will be awesome. In the meantime I am not surprised by what I am seeing and in fact the wound appears to be better as compared to previous findings. Objective Constitutional Well-nourished and well-hydrated in no acute distress. Vitals Time Taken: 11:35 AM, Height: 58 in, Weight: 137 lbs, BMI: 28.6, Temperature: 98.4 F, Pulse: 87 bpm, Respiratory Rate: 18 breaths/min, Blood Pressure: 159/73 mmHg. Respiratory normal breathing without  difficulty. Crumm, Kimberla L. ([id number]) Psychiatric this patient is able to make decisions and demonstrates good insight into disease process. Alert and Oriented x 3. pleasant and cooperative. General Notes: Upon inspection patient's wound actually did require some sharp debridement to clear away some of the necrotic debris and slough. She tolerated the debridement for the most part without complication. Post  debridement the wound bed appears to be doing much better. With that being said this is something that to be honest is still going to require additional debridement and I think the Iodoflex is the way to go. Integumentary (Hair, Skin) Wound #2 status is Open. Original cause of wound was Pressure Injury. The date acquired was: 04/11/2020. The wound has been in treatment 3 weeks. The wound is located on the Left,Medial Calcaneus. The wound measures 1.5cm length x 1.5cm width x 1cm depth; 1.767cm^2 area and 1.767cm^3 volume. There is Fat Layer (Subcutaneous Tissue) exposed. There is no tunneling or undermining noted. There is a medium amount of serosanguineous drainage noted. There is medium (34-66%) granulation within the wound bed. There is a medium (34-66%) amount of necrotic tissue within the wound bed including Adherent Slough. Assessment Active Problems ICD-10 Pressure ulcer of left heel, stage 3 Essential (primary) hypertension Vascular dementia without behavioral disturbance Atherosclerotic heart disease of native coronary artery without angina pectoris Procedures Wound #2 Pre-procedure diagnosis of Wound #2 is a Pressure Ulcer located on the Left,Medial Calcaneus . There was a Excisional Skin/Subcutaneous Tissue Debridement with a total area of 2.25 sq cm performed by Tommie Sams., PA-C. With the following instrument(s): Curette to remove Viable and Non-Viable tissue/material. Material removed includes Callus, Subcutaneous Tissue, and Slough. A time out was conducted at 12:11,  prior to the start of the procedure. A Minimum amount of bleeding was controlled with Pressure. The procedure was tolerated well. Post Debridement Measurements: 1.5cm length x 1.5cm width x 1.1cm depth; 1.944cm^3 volume. Post debridement Stage noted as Category/Stage III. Character of Wound/Ulcer Post Debridement is stable. Post procedure Diagnosis Wound #2: Same as Pre-Procedure Plan Follow-up Appointments: Return Appointment in 1 week. Bathing/ Shower/ Hygiene: May shower; gently cleanse wound with antibacterial soap, rinse and pat dry prior to dressing wounds Off-Loading: Other: - Offloading boots while in bed; Try pillow under sheet to keep it in place. WOUND #2: - Calcaneus Wound Laterality: Left, Medial Cleanser: Normal Saline 3 x Per Week/30 Days Discharge Instructions: Wash your hands with soap and water. Remove old dressing, discard into plastic bag and place into trash. Cleanse the wound with Normal Saline prior to applying a clean dressing using gauze sponges, not tissues or cotton balls. Do not scrub or use excessive force. Pat dry using gauze sponges, not tissue or cotton balls. Primary Dressing: Iodosorb 40 (g) (Generic) 3 x Per Week/30 Days Discharge Instructions: Apply IodoSorb to wound bed only as directed. Secondary Dressing: Mepilex Border Flex, 4x4 (in/in) (Generic) 3 x Per Week/30 Days Discharge Instructions: Apply to wound as directed. Do not cut. 1. Would recommend at this point that we have the patient going continue with the wound care measures as before and the patient's daughter is in agreement with the plan. I do feel like this is doing well with the Iodosorb. 2. I am also can recommend that we have the patient continue with the border foam dressing to cover I think this is doing a good job. Mckamie, Keshonda L. ([id number]) 3. I am also going to suggest that we have the patient monitored for any signs of infection regularly I will see her back next week and we will  see how things are doing. We will see patient back for reevaluation in 1 week here in the clinic. If anything worsens or changes patient will contact our office for additional recommendations. Electronic Signature(s) Signed: 07/17/2020 5:35:56 PM By: Worthy Keeler PA-C Entered By: Worthy Keeler  on 07/17/2020 17:35:55 Tovey, Oakleigh L. ([id number]) -------------------------------------------------------------------------------- SuperBill Details Patient Name: Luna, Alyssandra L. Date of Service: 07/17/2020 Medical Record Number: [id number] Patient Account Number: [id number] Date of Birth/Sex: 03/03/1926 (87 y.o. F) Treating RN: Dolan Amen Primary Care Provider: [name] Other Clinician: Referring Provider: [name] Treating Provider/Extender: Skipper Cliche in Treatment: 3 Diagnosis Coding ICD-10 Codes Code Description [phone] Pressure ulcer of left heel, stage 3 I10 Essential (primary) hypertension F01.50 Vascular dementia without behavioral disturbance I25.10 Atherosclerotic heart disease of native coronary artery without angina pectoris Facility Procedures CPT4 Code: [id number] Description: [id number] - DEB SUBQ TISSUE 20 SQ CM/< Modifier: Quantity: 1 CPT4 Code: Description: ICD-10 Diagnosis Description L89.623 Pressure ulcer of left heel, stage 3 Modifier: Quantity: Physician Procedures CPT4 Code: [id number] Description: 11042 - WC PHYS SUBQ TISS 20 SQ CM Modifier: Quantity: 1 CPT4 Code: Description: ICD-10 Diagnosis Description L89.623 Pressure ulcer of left heel, stage 3 Modifier: Quantity: Electronic Signature(s) Signed: 07/17/2020 5:36:06 PM By: Worthy Keeler PA-C Previous Signature: 07/17/2020 4:43:03 PM Version By: Georges Mouse, Minus Breeding RN Entered By: Worthy Keeler on 07/17/2020 17:36:06

## 2020-07-24 ENCOUNTER — Other Ambulatory Visit: Payer: Self-pay

## 2020-07-24 ENCOUNTER — Encounter: Payer: PPO | Attending: Physician Assistant | Admitting: Physician Assistant

## 2020-07-24 DIAGNOSIS — L97519 Non-pressure chronic ulcer of other part of right foot with unspecified severity: Secondary | ICD-10-CM | POA: Diagnosis not present

## 2020-07-24 DIAGNOSIS — N183 Chronic kidney disease, stage 3 unspecified: Secondary | ICD-10-CM | POA: Insufficient documentation

## 2020-07-24 DIAGNOSIS — L980 Pyogenic granuloma: Secondary | ICD-10-CM | POA: Diagnosis not present

## 2020-07-24 DIAGNOSIS — I129 Hypertensive chronic kidney disease with stage 1 through stage 4 chronic kidney disease, or unspecified chronic kidney disease: Secondary | ICD-10-CM | POA: Insufficient documentation

## 2020-07-24 DIAGNOSIS — L89623 Pressure ulcer of left heel, stage 3: Secondary | ICD-10-CM | POA: Insufficient documentation

## 2020-07-24 NOTE — Progress Notes (Addendum)
Cech, Dior L. ([id number]) Visit Report for 07/24/2020 Arrival Information Details Patient Name: [name] Date of Service: 07/24/2020 11:15 AM Medical Record Number: [id number] Patient Account Number: [id number] Date of Birth/Sex: May 20, 1926 (87 y.o. F) Treating RN: Donnamarie Poag Primary Care Saydee Zolman: [name] Other Clinician: Referring Traycen Goyer: [name] Treating Daiton Cowles/Extender: Skipper Cliche in Treatment: 4 Visit Information History Since Last Visit Added or deleted any medications: No Patient Arrived: Wheel Chair Had a fall or experienced change in No Arrival Time: 11:15 activities of daily living that may affect Accompanied By: daughter risk of falls: Transfer Assistance: None Hospitalized since last visit: No Patient Identification Verified: Yes Pain Present Now: No Secondary Verification Process Completed: Yes Patient Requires Transmission-Based No Precautions: Patient Has Alerts: Yes Patient Alerts: NOT diabetic ABI R1.18 L1.25 06/27/20 Electronic Signature(s) Signed: 07/24/2020 1:59:03 PM By: Donnamarie Poag Entered ByDonnamarie Poag on 07/24/2020 11:17:37 Rome, Rainy L. ([id number]) -------------------------------------------------------------------------------- Clinic Level of Care Assessment Details Patient Name: Turk, Marshall L. Date of Service: 07/24/2020 11:15 AM Medical Record Number: [id number] Patient Account Number: [id number] Date of Birth/Sex: 12/21/1926 (87 y.o. F) Treating RN: Dolan Amen Primary Care Cheryll Keisler: [name] Other Clinician: Referring Arda Keadle: [name] Treating Atwell Mcdanel/Extender: Skipper Cliche in Treatment: 4 Clinic Level of Care Assessment Items TOOL 1 Quantity Score '[]'$  - Use when EandM and Procedure is performed on INITIAL visit 0 ASSESSMENTS - Nursing Assessment / Reassessment '[]'$  - General Physical Exam (combine w/ comprehensive assessment (listed just below) when performed on new 0 pt.  evals) '[]'$  - 0 Comprehensive Assessment (HX, ROS, Risk Assessments, Wounds Hx, etc.) ASSESSMENTS - Wound and Skin Assessment / Reassessment '[]'$  - Dermatologic / Skin Assessment (not related to wound area) 0 ASSESSMENTS - Ostomy and/or Continence Assessment and Care '[]'$  - Incontinence Assessment and Management 0 '[]'$  - 0 Ostomy Care Assessment and Management (repouching, etc.) PROCESS - Coordination of Care '[]'$  - Simple Patient / Family Education for ongoing care 0 '[]'$  - 0 Complex (extensive) Patient / Family Education for ongoing care '[]'$  - 0 Staff obtains Programmer, systems, Records, Test Results / Process Orders '[]'$  - 0 Staff telephones HHA, Nursing Homes / Clarify orders / etc '[]'$  - 0 Routine Transfer to another Facility (non-emergent condition) '[]'$  - 0 Routine Hospital Admission (non-emergent condition) '[]'$  - 0 New Admissions / Biomedical engineer / Ordering NPWT, Apligraf, etc. '[]'$  - 0 Emergency Hospital Admission (emergent condition) PROCESS - Special Needs '[]'$  - Pediatric / Minor Patient Management 0 '[]'$  - 0 Isolation Patient Management '[]'$  - 0 Hearing / Language / Visual special needs '[]'$  - 0 Assessment of Community assistance (transportation, D/C planning, etc.) '[]'$  - 0 Additional assistance / Altered mentation '[]'$  - 0 Support Surface(s) Assessment (bed, cushion, seat, etc.) INTERVENTIONS - Miscellaneous '[]'$  - External ear exam 0 '[]'$  - 0 Patient Transfer (multiple staff / Civil Service fast streamer / Similar devices) '[]'$  - 0 Simple Staple / Suture removal (25 or less) '[]'$  - 0 Complex Staple / Suture removal (26 or more) '[]'$  - 0 Hypo/Hyperglycemic Management (do not check if billed separately) '[]'$  - 0 Ankle / Brachial Index (ABI) - do not check if billed separately Has the patient been seen at the hospital within the last three years: Yes Total Score: 0 Level Of Care: ____ Asebedo, Ophie L. ([id number]) Electronic Signature(s) Signed: 07/24/2020 4:19:01 PM By: Georges Mouse, Minus Breeding RN Entered By:  Georges Mouse, Minus Breeding on 07/24/2020 11:33:31 Holberg, Raeana L. ([id number]) -------------------------------------------------------------------------------- Encounter Discharge Information Details Patient Name: Michelotti, Lyric L. Date of  Service: 07/24/2020 11:15 AM Medical Record Number: [id number] Patient Account Number: [id number] Date of Birth/Sex: [birth year] (87 y.o. F) Treating RN: Dolan Amen Primary Care Lakeithia Rasor: [name] Other Clinician: Referring Airiel Oblinger: [name] Treating Annora Guderian/Extender: Skipper Cliche in Treatment: 4 Encounter Discharge Information Items Post Procedure Vitals Discharge Condition: Stable Temperature (F): 97.8 Ambulatory Status: Wheelchair Pulse (bpm): 78 Discharge Destination: Home Respiratory Rate (breaths/min): 18 Transportation: Private Auto Blood Pressure (mmHg): 131/73 Accompanied By: daughter Schedule Follow-up Appointment: Yes Clinical Summary of Care: Electronic Signature(s) Signed: 07/26/2020 4:32:45 PM By: Jeanine Luz Entered By: Jeanine Luz on 07/24/2020 11:42:46 Perret, Rami L. ([id number]) -------------------------------------------------------------------------------- Lower Extremity Assessment Details Patient Name: Kondracki, Tequilla L. Date of Service: 07/24/2020 11:15 AM Medical Record Number: [id number] Patient Account Number: [id number] Date of Birth/Sex: Sep 01, 1926 (87 y.o. F) Treating RN: Donnamarie Poag Primary Care Senon Nixon: [name] Other Clinician: Referring Carolie Mcilrath: [name] Treating Shela Esses/Extender: Skipper Cliche in Treatment: 4 Edema Assessment Assessed: Shirlyn Goltz: Yes] [Right: No] Edema: [Left: Ye] [Right: s] Calf Left: Right: Point of Measurement: 32 cm From Medial Instep 34 cm Ankle Left: Right: Point of Measurement: 12 cm From Medial Instep 26 cm Vascular Assessment Pulses: Dorsalis Pedis Palpable: [Left:Yes] Electronic Signature(s) Signed: 07/24/2020 1:59:03 PM By: Donnamarie Poag Entered ByDonnamarie Poag on 07/24/2020 11:26:38 Leppanen, Shakeena L. ([id number]) -------------------------------------------------------------------------------- Multi Wound Chart Details Patient Name: Hambly, Timira L. Date of Service: 07/24/2020 11:15 AM Medical Record Number: [id number] Patient Account Number: [id number] Date of Birth/Sex: 09-Aug-1926 (87 y.o. F) Treating RN: Dolan Amen Primary Care Devontaye Ground: [name] Other Clinician: Referring Nara Paternoster: [name] Treating Javeon Macmurray/Extender: Skipper Cliche in Treatment: 4 Vital Signs Height(in): 58 Pulse(bpm): 28 Weight(lbs): 137 Blood Pressure(mmHg): 131/73 Body Mass Index(BMI): 29 Temperature(F): 97.8 Respiratory Rate(breaths/min): 18 Photos: [N/A:N/A] Wound Location: Left, Medial Calcaneus N/A N/A Wounding Event: Pressure Injury N/A N/A Primary Etiology: Pressure Ulcer N/A N/A Comorbid History: Cataracts, Anemia, Coronary Artery N/A N/A Disease, Hypertension, Myocardial Infarction, End Stage Renal Disease, Gout, Osteoarthritis, Dementia Date Acquired: 04/11/2020 N/A N/A Weeks of Treatment: 4 N/A N/A Wound Status: Open N/A N/A Measurements L x W x D (cm) 1.4x1.6x0.6 N/A N/A Area (cm) : 1.759 N/A N/A Volume (cm) : 1.056 N/A N/A % Reduction in Area: -49.30% N/A N/A % Reduction in Volume: -124.20% N/A N/A Classification: Category/Stage III N/A N/A Exudate Amount: Medium N/A N/A Exudate Type: Serosanguineous N/A N/A Exudate Color: red, brown N/A N/A Granulation Amount: Medium (34-66%) N/A N/A Granulation Quality: Pink, Pale N/A N/A Necrotic Amount: Medium (34-66%) N/A N/A Exposed Structures: Fat Layer (Subcutaneous Tissue): N/A N/A Yes Fascia: No Tendon: No Muscle: No Joint: No Bone: No Epithelialization: None N/A N/A Treatment Notes Electronic Signature(s) Signed: 07/24/2020 4:19:01 PM By: Georges Mouse, Minus Breeding RN Entered By: Georges Mouse, Minus Breeding on 07/24/2020 11:30:35 Kronk, Jennafer L.  ([id number]) -------------------------------------------------------------------------------- [place] Details Patient Name: Dresner, Desaree L. Date of Service: 07/24/2020 11:15 AM Medical Record Number: [id number] Patient Account Number: [id number] Date of Birth/Sex: [birth year] (87 y.o. F) Treating RN: Dolan Amen Primary Care Korin Setzler: [name] Other Clinician: Referring Aarianna Hoadley: [name] Treating Shalise Rosado/Extender: Skipper Cliche in Treatment: 4 Active Inactive Wound/Skin Impairment Nursing Diagnoses: Impaired tissue integrity Goals: Patient/caregiver will verbalize understanding of skin care regimen Date Initiated: 06/26/2020 Target Resolution Date: 06/26/2020 Goal Status: Active Ulcer/skin breakdown will have a volume reduction of 30% by week 4 Date Initiated: 06/26/2020 Target Resolution Date: 07/27/2020 Goal Status: Active Ulcer/skin breakdown will have a volume reduction of 50% by week 8 Date Initiated: 06/26/2020 Target  Resolution Date: 08/26/2020 Goal Status: Active Ulcer/skin breakdown will have a volume reduction of 80% by week 12 Date Initiated: 06/26/2020 Target Resolution Date: 09/26/2020 Goal Status: Active Ulcer/skin breakdown will heal within 14 weeks Date Initiated: 06/26/2020 Target Resolution Date: 10/27/2020 Goal Status: Active Interventions: Assess patient/caregiver ability to obtain necessary supplies Assess patient/caregiver ability to perform ulcer/skin care regimen upon admission and as needed Assess ulceration(s) every visit Provide education on ulcer and skin care Treatment Activities: Referred to DME Sherif Millspaugh for dressing supplies : 06/26/2020 Skin care regimen initiated : 06/26/2020 Notes: Electronic Signature(s) Signed: 07/24/2020 4:19:01 PM By: Georges Mouse, Minus Breeding RN Entered By: Georges Mouse, Minus Breeding on 07/24/2020 11:30:27 Class, Taji L.  ([id number]) -------------------------------------------------------------------------------- Pain Assessment Details Patient Name: Madole, Rosaleen L. Date of Service: 07/24/2020 11:15 AM Medical Record Number: [id number] Patient Account Number: [id number] Date of Birth/Sex: 01/03/1927 (87 y.o. F) Treating RN: Donnamarie Poag Primary Care Keyira Mondesir: [name] Other Clinician: Referring Porchia Sinkler: [name] Treating Edger Husain/Extender: Skipper Cliche in Treatment: 4 Active Problems Location of Pain Severity and Description of Pain Patient Has Paino No Site Locations Rate the pain. Current Pain Level: 0 Pain Management and Medication Current Pain Management: Electronic Signature(s) Signed: 07/24/2020 1:59:03 PM By: Donnamarie Poag Entered By: Donnamarie Poag on 07/24/2020 11:20:43 Guizar, Trevia L. ([id number]) -------------------------------------------------------------------------------- Patient/Caregiver Education Details Patient Name: Deprey, Maryellen L. Date of Service: 07/24/2020 11:15 AM Medical Record Number: [id number] Patient Account Number: [id number] Date of Birth/Gender: 07/29/1926 (87 y.o. F) Treating RN: Dolan Amen Primary Care Physician: [name] Other Clinician: Referring Physician: [name] Treating Physician/Extender: Skipper Cliche in Treatment: 4 Education Assessment Education Provided To: Patient Education Topics Provided Wound/Skin Impairment: Methods: Explain/Verbal Responses: State content correctly Electronic Signature(s) Signed: 07/24/2020 4:19:01 PM By: Georges Mouse, Minus Breeding RN Entered By: Georges Mouse, Minus Breeding on 07/24/2020 11:33:45 Imler, [place] ([id number]) -------------------------------------------------------------------------------- Wound Assessment Details Patient Name: Josephson, Hadlea L. Date of Service: 07/24/2020 11:15 AM Medical Record Number: [id number] Patient Account Number: [id number] Date of Birth/Sex: [birth year] (87  y.o. F) Treating RN: Donnamarie Poag Primary Care Ardit Danh: [name] Other Clinician: Referring Pattricia Weiher: [name] Treating Rashell Shambaugh/Extender: Skipper Cliche in Treatment: 4 Wound Status Wound Number: 2 Primary Pressure Ulcer Etiology: Wound Location: Left, Medial Calcaneus Wound Open Wounding Event: Pressure Injury Status: Date Acquired: 04/11/2020 Comorbid Cataracts, Anemia, Coronary Artery Disease, Weeks Of Treatment: 4 History: Hypertension, Myocardial Infarction, End Stage Renal Clustered Wound: No Disease, Gout, Osteoarthritis, Dementia Photos Wound Measurements Length: (cm) 1.4 Width: (cm) 1.6 Depth: (cm) 0.6 Area: (cm) 1.759 Volume: (cm) 1.056 % Reduction in Area: -49.3% % Reduction in Volume: -124.2% Epithelialization: None Tunneling: No Undermining: No Wound Description Classification: Category/Stage III Exudate Amount: Medium Exudate Type: Serosanguineous Exudate Color: red, brown Foul Odor After Cleansing: No Slough/Fibrino Yes Wound Bed Granulation Amount: Medium (34-66%) Exposed Structure Granulation Quality: Pink, Pale Fascia Exposed: No Necrotic Amount: Medium (34-66%) Fat Layer (Subcutaneous Tissue) Exposed: Yes Necrotic Quality: Adherent Slough Tendon Exposed: No Muscle Exposed: No Joint Exposed: No Bone Exposed: No Treatment Notes Wound #2 (Calcaneus) Wound Laterality: Left, Medial Cleanser Normal Saline Discharge Instruction: Wash your hands with soap and water. Remove old dressing, discard into plastic bag and place into trash. Cleanse the wound with Normal Saline prior to applying a clean dressing using gauze sponges, not tissues or cotton balls. Do not scrub or use excessive force. Pat dry using gauze sponges, not tissue or cotton balls. Ventrella, Lyniah L. ([id number]) Peri-Wound Care Topical Primary Dressing Iodosorb 40 (g) Discharge Instruction: Apply IodoSorb to  wound bed only as directed. Secondary Dressing Mepilex  Border Flex, 4x4 (in/in) Discharge Instruction: Apply to wound as directed. Do not cut. Secured With Compression Wrap Compression Stockings Add-Ons Electronic Signature(s) Signed: 07/24/2020 1:59:03 PM By: Donnamarie Poag Entered By: Donnamarie Poag on 07/24/2020 11:25:10 Tuckerman, Calisa L. ([id number]) -------------------------------------------------------------------------------- Vitals Details Patient Name: Majchrzak, Shakiyah L. Date of Service: 07/24/2020 11:15 AM Medical Record Number: [id number] Patient Account Number: [id number] Date of Birth/Sex: 1926/08/03 (87 y.o. F) Treating RN: Donnamarie Poag Primary Care Ralene Gasparyan: [name] Other Clinician: Referring Aadin Gaut: [name] Treating Fortino Haag/Extender: Skipper Cliche in Treatment: 4 Vital Signs Time Taken: 11:17 Temperature (F): 97.8 Height (in): 58 Pulse (bpm): 78 Weight (lbs): 137 Respiratory Rate (breaths/min): 18 Body Mass Index (BMI): 28.6 Blood Pressure (mmHg): 131/73 Reference Range: 80 - 120 mg / dl Electronic Signature(s) Signed: 07/24/2020 1:59:03 PM By: Donnamarie Poag Entered ByDonnamarie Poag on 07/24/2020 11:20:35

## 2020-07-24 NOTE — Progress Notes (Addendum)
Iovine, Emmah L. ([id number]) Visit Report for 07/24/2020 Chief Complaint Document Details Patient Name: [name] [name], [name] Date of Service: 07/24/2020 11:15 AM Medical Record Number: [id number] Patient Account Number: [id number] Date of Birth/Sex: 12/23/1926 (87 y.o. F) Treating RN: Dolan Amen Primary Care Provider: [name] Other Clinician: Referring Provider: [name] Treating Provider/Extender: Skipper Cliche in Treatment: 4 Information Obtained from: Patient Chief Complaint Left Heel Pressure Ulcer Electronic Signature(s) Signed: 07/24/2020 10:56:01 AM By: Worthy Keeler PA-C Entered By: Worthy Keeler on 07/24/2020 10:56:01 [name] [name], [name] ([id number]) -------------------------------------------------------------------------------- Debridement Details Patient Name: [name], [name] Date of Service: 07/24/2020 11:15 AM Medical Record Number: [id number] Patient Account Number: [id number] Date of Birth/Sex: [birth year] (87 y.o. F) Treating RN: Dolan Amen Primary Care Provider: [name] Other Clinician: Referring Provider: [name] Treating Provider/Extender: Skipper Cliche in Treatment: 4 Debridement Performed for Wound #2 Left,Medial Calcaneus Assessment: Performed By: Physician Tommie Sams., PA-C Debridement Type: Debridement Level of Consciousness (Pre- Awake and Alert procedure): Pre-procedure Verification/Time Out Yes - 11:31 Taken: Start Time: 11:31 Total Area Debrided (L x W): 1.4 (cm) x 1.8 (cm) = 2.52 (cm) Tissue and other material Viable, Non-Viable, Callus, Slough, Subcutaneous, Slough debrided: Level: Skin/Subcutaneous Tissue Debridement Description: Excisional Instrument: Curette Bleeding: Minimum Hemostasis Achieved: Pressure Response to Treatment: Procedure was tolerated well Level of Consciousness (Post- Awake and Alert procedure): Post Debridement Measurements of Total Wound Length: (cm) 1.4 Stage:  Category/Stage III Width: (cm) 1.8 Depth: (cm) 0.7 Volume: (cm) 1.385 Character of Wound/Ulcer Post Debridement: Stable Post Procedure Diagnosis Same as Pre-procedure Electronic Signature(s) Signed: 07/24/2020 4:19:01 PM By: Charlett Nose RN Signed: 07/24/2020 6:18:59 PM By: Worthy Keeler PA-C Entered By: Georges Mouse, Minus Breeding on 07/24/2020 11:33:05 [name] [name], [name] ([id number]) -------------------------------------------------------------------------------- HPI Details Patient Name: [name], [name] Date of Service: 07/24/2020 11:15 AM Medical Record Number: [id number] Patient Account Number: [id number] Date of Birth/Sex: 12-16-1926 (87 y.o. F) Treating RN: Dolan Amen Primary Care Provider: [name] Other Clinician: Referring Provider: [name] Treating Provider/Extender: Skipper Cliche in Treatment: 4 History of Present Illness HPI Description: 87 year old patient was recently seen by the PCPs office for significant pain right great toe which has been going on since July. She was initially treated with Keflex which she did not complete and after the office visit this time she has been put on doxycycline. at the time of her visit she was found to have a ulcer on the plantar surface of the right great toe and also had a pyogenic granuloma over this area. X-ray of the right foot done 12/29/2014 -- IMPRESSION:Soft-tissue swelling and ulceration right great toe. No underlying bony lytic lesion identified. If osteomyelitis remains of clinical concern MRI can be obtained. Past medical history significant for anemia, chronic kidney disease stage III, obesity, varicose veins, coronary artery disease, gout, history of nicotine addiction given up smoking in 2002, hypertension, status post cardiac catheterization, status post abdominal hysterectomy, cholecystectomy and tonsillectomy. hemoglobin A1c done in August was 5.8 01/22/2015 -- at this stage the [facility] Walking  boat was going to cost them significant amount of money and they want to defer using that at the present time. 01/29/2015 -- she had a podiatry appointment and they have trimmed her toenails. She has not heard back from the vascular office regarding her venous duplex study and I have asked them to call personally so that they can get the appointment soon. 02/06/2015 -- they have made contact with the vascular office and from what I  understand that test has been done but the report is pending. 02/19/2014 -- the vascular test is scheduled for tomorrow Readmission: 06/26/2020 upon evaluation today patient appears for initial evaluation in her clinic that she has been here before in 2016 and has been quite sometime. She did have a fractured hip in February on the 23rd 2022. Subsequently this had to be pinned and she ended up with a pressure injury on her heel following when she was using her foot to help move her around in the bed. Subsequently this has led to the wound that she has been dealing with since that time. She lives at home with her daughter currently she does have dementia. 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 07/03/2020 upon evaluation today patient appears to be doing well with regard to her heel ulcer. She did have arterial studies they appear to be doing excellent it was premature normal across the board with TBI's in the 90s and ABIs well within normal range. Nonetheless there does not appear to be any signs of arterial insufficiency whatsoever. With that being said the patient does have also signs of improvement there is some necrotic tissue in the base of the wound number to try to clear some of that away today I do believe the Iodoflex/Iodosorb is doing well. 5/24; difficult punched out wound on the left medial heel. She has been using  Iodoflex 07/17/2020 upon evaluation today patient appears to be doing well at this point in regard to her wound. I do feel like this is a little bit deeper but again that is what is expected as we continue with the Iodoflex I think that this is just going to get deeper until we get to the base of the wound. With that being said I think we are getting much closer to the base of the wound where we can have healthier tissue that we get a be managing here which that will be awesome. In the meantime I am not surprised by what I am seeing and in fact the wound appears to be better as compared to previous findings. 07/24/2020 upon evaluation today patient appears to be doing well with regard to her wound. Overall I am extremely pleased with where things stand today. I do not see any signs of active infection which is great and overall I think that the patient is making good progress. There is good to be some need for sharp debridement today. Electronic Signature(s) Signed: 07/24/2020 5:42:55 PM By: Worthy Keeler PA-C Entered By: Worthy Keeler on 07/24/2020 17:42:55 [name] [name], [name] ([id number]) -------------------------------------------------------------------------------- Physical Exam Details Patient Name: [name] [name], [name] Date of Service: 07/24/2020 11:15 AM Medical Record Number: [id number] Patient Account Number: [id number] Date of Birth/Sex: March 11, 1926 (87 y.o. F) Treating RN: Dolan Amen Primary Care Provider: [name] Other Clinician: Referring Provider: [name] Treating Provider/Extender: Skipper Cliche in Treatment: 4 Constitutional Well-nourished and well-hydrated in no acute distress. Respiratory normal breathing without difficulty. Psychiatric this patient is able to make decisions and demonstrates good insight into disease process. Alert and Oriented x 3. pleasant and cooperative. Notes Patient's wound bed did require sharp debridement clearly some of the  necrotic debris on the surface of wound she tolerated that today without complication postdebridement wound bed appears to be doing much better which is great news. Electronic Signature(s) Signed: 07/24/2020 5:43:08 PM By: Worthy Keeler  PA-C Entered By: Worthy Keeler on 07/24/2020 17:43:08 [name] [name], [name] ([id number]) -------------------------------------------------------------------------------- Physician Orders Details Patient Name: [name] [name], [name] Date of Service: 07/24/2020 11:15 AM Medical Record Number: [id number] Patient Account Number: [id number] Date of Birth/Sex: 12/01/1926 (87 y.o. F) Treating RN: Dolan Amen Primary Care Provider: [name] Other Clinician: Referring Provider: [name] Treating Provider/Extender: Skipper Cliche in Treatment: 4 Verbal / Phone Orders: No Diagnosis Coding ICD-10 Coding Code Description [phone] Pressure ulcer of left heel, stage 3 I10 Essential (primary) hypertension F01.50 Vascular dementia without behavioral disturbance I25.10 Atherosclerotic heart disease of native coronary artery without angina pectoris Follow-up Appointments o Return Appointment in 1 week. Bathing/ Shower/ Hygiene o May shower; gently cleanse wound with antibacterial soap, rinse and pat dry prior to dressing wounds Off-Loading o Other: - Offloading boots while in bed; Try pillow under sheet to keep it in place. Wound Treatment Wound #2 - Calcaneus Wound Laterality: Left, Medial Cleanser: Normal Saline 3 x Per Week/30 Days Discharge Instructions: Wash your hands with soap and water. Remove old dressing, discard into plastic bag and place into trash. Cleanse the wound with Normal Saline prior to applying a clean dressing using gauze sponges, not tissues or cotton balls. Do not scrub or use excessive force. Pat dry using gauze sponges, not tissue or cotton balls. Primary Dressing: Iodosorb 40 (g) (Generic) 3 x Per Week/30 Days Discharge  Instructions: Apply IodoSorb to wound bed only as directed. Secondary Dressing: Mepilex Border Flex, 4x4 (in/in) (DME) (Generic) 3 x Per Week/30 Days Discharge Instructions: Apply to wound as directed. Do not cut. Electronic Signature(s) Signed: 07/24/2020 4:19:01 PM By: Georges Mouse, Minus Breeding RN Signed: 07/24/2020 6:18:59 PM By: Worthy Keeler PA-C Entered By: Georges Mouse, Minus Breeding on 07/24/2020 13:11:17 [name] [name], [name] ([id number]) -------------------------------------------------------------------------------- Problem List Details Patient Name: [name] [name], [name] Date of Service: 07/24/2020 11:15 AM Medical Record Number: [id number] Patient Account Number: [id number] Date of Birth/Sex: [birth year] (87 y.o. F) Treating RN: Dolan Amen Primary Care Provider: [name] Other Clinician: Referring Provider: [name] Treating Provider/Extender: Skipper Cliche in Treatment: 4 Active Problems ICD-10 Encounter Code Description Active Date MDM Diagnosis [phone] Pressure ulcer of left heel, stage 3 06/26/2020 No Yes I10 Essential (primary) hypertension 06/26/2020 No Yes F01.50 Vascular dementia without behavioral disturbance 06/26/2020 No Yes I25.10 Atherosclerotic heart disease of native coronary artery without angina 06/26/2020 No Yes pectoris Inactive Problems Resolved Problems Electronic Signature(s) Signed: 07/24/2020 10:55:56 AM By: Worthy Keeler PA-C Entered By: Worthy Keeler on 07/24/2020 10:55:56 [name] [name], [name] ([id number]) -------------------------------------------------------------------------------- Progress Note Details Patient Name: [name] [name], [name] Date of Service: 07/24/2020 11:15 AM Medical Record Number: [id number] Patient Account Number: [id number] Date of Birth/Sex: 10-08-1926 (87 y.o. F) Treating RN: Dolan Amen Primary Care Provider: [name] Other Clinician: Referring Provider: [name] Treating Provider/Extender: Skipper Cliche in Treatment: 4 Subjective Chief Complaint Information obtained from Patient Left Heel Pressure Ulcer History of Present Illness (HPI) 87 year old patient was recently seen by the PCPs office for significant pain right great toe which has been going on since July. She was initially treated with Keflex which she did not complete and after the office visit this time she has been put on doxycycline. at the time of her visit she was found to have a ulcer on the plantar surface of the right great toe and also had a pyogenic granuloma over this area. X-ray of the right foot done 12/29/2014 -- IMPRESSION:Soft-tissue swelling and ulceration right great toe. No underlying  bony lytic lesion identified. If osteomyelitis remains of clinical concern MRI can be obtained. Past medical history significant for anemia, chronic kidney disease stage III, obesity, varicose veins, coronary artery disease, gout, history of nicotine addiction given up smoking in 2002, hypertension, status post cardiac catheterization, status post abdominal hysterectomy, cholecystectomy and tonsillectomy. hemoglobin A1c done in August was 5.8 01/22/2015 -- at this stage the [facility] Walking boat was going to cost them significant amount of money and they want to defer using that at the present time. 01/29/2015 -- she had a podiatry appointment and they have trimmed her toenails. She has not heard back from the vascular office regarding her venous duplex study and I have asked them to call personally so that they can get the appointment soon. 02/06/2015 -- they have made contact with the vascular office and from what I understand that test has been done but the report is pending. 02/19/2014 -- the vascular test is scheduled for tomorrow Readmission: 06/26/2020 upon evaluation today patient appears for initial evaluation in her clinic that she has been here before in 2016 and has been quite sometime. She did have a fractured hip in  February on the 23rd 2022. Subsequently this had to be pinned and she ended up with a pressure injury on her heel following when she was using her foot to help move her around in the bed. Subsequently this has led to the wound that she has been dealing with since that time. She lives at home with her daughter currently she does have dementia. 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 07/03/2020 upon evaluation today patient appears to be doing well with regard to her heel ulcer. She did have arterial studies they appear to be doing excellent it was premature normal across the board with TBI's in the 90s and ABIs well within normal range. Nonetheless there does not appear to be any signs of arterial insufficiency whatsoever. With that being said the patient does have also signs of improvement there is some necrotic tissue in the base of the wound number to try to clear some of that away today I do believe the Iodoflex/Iodosorb is doing well. 5/24; difficult punched out wound on the left medial heel. She has been using Iodoflex 07/17/2020 upon evaluation today patient appears to be doing well at this point in regard to her wound. I do feel like this is a little bit deeper but again that is what is expected as we continue with the Iodoflex I think that this is just going to get deeper until we get to the base of the wound. With that being said I think we are getting much closer to the base of the wound where we can have healthier tissue that we get a be managing here which that will be awesome. In the meantime I am not surprised by what I am seeing and in fact the wound appears to be better as compared to previous findings. 07/24/2020 upon evaluation today patient appears to be doing well with regard to her wound. Overall I am extremely pleased with where things stand today.  I do not see any signs of active infection which is great and overall I think that the patient is making good progress. There is good to be some need for sharp debridement today. Objective Constitutional Well-nourished and well-hydrated in no acute distress. Vitals Time Taken: 11:17 AM,  Height: 58 in, Weight: 137 lbs, BMI: 28.6, Temperature: 97.8 F, Pulse: 78 bpm, Respiratory Rate: 18 breaths/min, Blood Pressure: 131/73 mmHg. [name] [name], [name] ([id number]) Respiratory normal breathing without difficulty. Psychiatric this patient is able to make decisions and demonstrates good insight into disease process. Alert and Oriented x 3. pleasant and cooperative. General Notes: Patient's wound bed did require sharp debridement clearly some of the necrotic debris on the surface of wound she tolerated that today without complication postdebridement wound bed appears to be doing much better which is great news. Integumentary (Hair, Skin) Wound #2 status is Open. Original cause of wound was Pressure Injury. The date acquired was: 04/11/2020. The wound has been in treatment 4 weeks. The wound is located on the Left,Medial Calcaneus. The wound measures 1.4cm length x 1.6cm width x 0.6cm depth; 1.759cm^2 area and 1.056cm^3 volume. There is Fat Layer (Subcutaneous Tissue) exposed. There is no tunneling or undermining noted. There is a medium amount of serosanguineous drainage noted. There is medium (34-66%) pink, pale granulation within the wound bed. There is a medium (34- 66%) amount of necrotic tissue within the wound bed including Adherent Slough. Assessment Active Problems ICD-10 Pressure ulcer of left heel, stage 3 Essential (primary) hypertension Vascular dementia without behavioral disturbance Atherosclerotic heart disease of native coronary artery without angina pectoris Procedures Wound #2 Pre-procedure diagnosis of Wound #2 is a Pressure Ulcer located on the Left,Medial Calcaneus . There was  a Excisional Skin/Subcutaneous Tissue Debridement with a total area of 2.52 sq cm performed by Tommie Sams., PA-C. With the following instrument(s): Curette to remove Viable and Non-Viable tissue/material. Material removed includes Callus, Subcutaneous Tissue, and Slough. A time out was conducted at 11:31, prior to the start of the procedure. A Minimum amount of bleeding was controlled with Pressure. The procedure was tolerated well. Post Debridement Measurements: 1.4cm length x 1.8cm width x 0.7cm depth; 1.385cm^3 volume. Post debridement Stage noted as Category/Stage III. Character of Wound/Ulcer Post Debridement is stable. Post procedure Diagnosis Wound #2: Same as Pre-Procedure Plan Follow-up Appointments: Return Appointment in 1 week. Bathing/ Shower/ Hygiene: May shower; gently cleanse wound with antibacterial soap, rinse and pat dry prior to dressing wounds Off-Loading: Other: - Offloading boots while in bed; Try pillow under sheet to keep it in place. WOUND #2: - Calcaneus Wound Laterality: Left, Medial Cleanser: Normal Saline 3 x Per Week/30 Days Discharge Instructions: Wash your hands with soap and water. Remove old dressing, discard into plastic bag and place into trash. Cleanse the wound with Normal Saline prior to applying a clean dressing using gauze sponges, not tissues or cotton balls. Do not scrub or use excessive force. Pat dry using gauze sponges, not tissue or cotton balls. Primary Dressing: Iodosorb 40 (g) (Generic) 3 x Per Week/30 Days Discharge Instructions: Apply IodoSorb to wound bed only as directed. Secondary Dressing: Mepilex Border Flex, 4x4 (in/in) (DME) (Generic) 3 x Per Week/30 Days Discharge Instructions: Apply to wound as directed. Do not cut. [name] [name], [name] ([id number]) 1. I am going to suggest we going to continue with the wound care measures as before and the patient is in agreement with that plan. This includes the use of the Iodosorb which I think  is still showing signs of improvement here as far as getting to the base of the wound. I feel like it is already starting to try to fill in some as well which is also good news. 2. Also can recommend that we continue to change this roughly every other  day or 3 times a week her daughter is doing a great job of this. We will see patient back for reevaluation in 1 week here in the clinic. If anything worsens or changes patient will contact our office for additional recommendations. Electronic Signature(s) Signed: 07/24/2020 5:43:48 PM By: Worthy Keeler PA-C Entered By: Worthy Keeler on 07/24/2020 17:43:47 Kowalczyk, Lauretta L. ([id number]) -------------------------------------------------------------------------------- SuperBill Details Patient Name: Schlageter, Kacy L. Date of Service: 07/24/2020 Medical Record Number: [id number] Patient Account Number: [id number] Date of Birth/Sex: 1926/04/10 (87 y.o. F) Treating RN: Dolan Amen Primary Care Provider: [name] Other Clinician: Referring Provider: [name] Treating Provider/Extender: Skipper Cliche in Treatment: 4 Diagnosis Coding ICD-10 Codes Code Description [phone] Pressure ulcer of left heel, stage 3 I10 Essential (primary) hypertension F01.50 Vascular dementia without behavioral disturbance I25.10 Atherosclerotic heart disease of native coronary artery without angina pectoris Facility Procedures CPT4 Code: [id number] Description: [id number] - DEB SUBQ TISSUE 20 SQ CM/< Modifier: Quantity: 1 CPT4 Code: Description: ICD-10 Diagnosis Description L89.623 Pressure ulcer of left heel, stage 3 Modifier: Quantity: Physician Procedures CPT4 Code: [id number] Description: 11042 - WC PHYS SUBQ TISS 20 SQ CM Modifier: Quantity: 1 CPT4 Code: Description: ICD-10 Diagnosis Description L89.623 Pressure ulcer of left heel, stage 3 Modifier: Quantity: Electronic Signature(s) Signed: 07/24/2020 5:44:02 PM By: Worthy Keeler  PA-C Previous Signature: 07/24/2020 4:19:01 PM Version By: Georges Mouse, Minus Breeding RN Entered By: Worthy Keeler on 07/24/2020 17:44:02

## 2020-07-27 DIAGNOSIS — L89623 Pressure ulcer of left heel, stage 3: Secondary | ICD-10-CM | POA: Diagnosis not present

## 2020-07-31 ENCOUNTER — Encounter: Payer: PPO | Admitting: Physician Assistant

## 2020-07-31 ENCOUNTER — Other Ambulatory Visit: Payer: Self-pay

## 2020-07-31 DIAGNOSIS — L89623 Pressure ulcer of left heel, stage 3: Secondary | ICD-10-CM | POA: Diagnosis not present

## 2020-07-31 NOTE — Progress Notes (Addendum)
[name], [name] ([id number]) Visit Report for 07/31/2020 Arrival Information Details Patient Name: [name] [name], [name] Date of Service: 07/31/2020 11:15 AM Medical Record Number: [id number] Patient Account Number: [id number] Date of Birth/Sex: 04-10-1926 (87 y.o. F) Treating RN: Carlene Coria Primary Care Jamillia Closson: [name] Other Clinician: Referring Drezden Seitzinger: [name] Treating Markela Wee/Extender: Skipper Cliche in Treatment: 5 Visit Information History Since Last Visit All ordered tests and consults were completed: No Patient Arrived: Wheel Chair Added or deleted any medications: No Arrival Time: 11:52 Any new allergies or adverse reactions: No Accompanied By: daughter Had a fall or experienced change in No Transfer Assistance: None activities of daily living that may affect Patient Identification Verified: Yes risk of falls: Secondary Verification Process Completed: Yes Signs or symptoms of abuse/neglect since last visito No Patient Requires Transmission-Based No Hospitalized since last visit: No Precautions: Implantable device outside of the clinic excluding No Patient Has Alerts: Yes cellular tissue based products placed in the center Patient Alerts: NOT diabetic since last visit: ABI R1.18 L1.25 Has Dressing in Place as Prescribed: Yes 06/27/20 Pain Present Now: No Electronic Signature(s) Signed: 08/01/2020 3:34:38 PM By: Carlene Coria RN Entered By: Carlene Coria on 07/31/2020 11:59:15 Boissonneault, Karter L. ([id number]) -------------------------------------------------------------------------------- Clinic Level of Care Assessment Details Patient Name: [name], [name] Date of Service: 07/31/2020 11:15 AM Medical Record Number: [id number] Patient Account Number: [id number] Date of Birth/Sex: [birth year] (87 y.o. F) Treating RN: Dolan Amen Primary Care Tracee Mccreery: [name] Other Clinician: Referring Brion Hedges: [name] Treating Haani Bakula/Extender:  Skipper Cliche in Treatment: 5 Clinic Level of Care Assessment Items TOOL 1 Quantity Score '[]'$  - Use when EandM and Procedure is performed on INITIAL visit 0 ASSESSMENTS - Nursing Assessment / Reassessment '[]'$  - General Physical Exam (combine w/ comprehensive assessment (listed just below) when performed on new 0 pt. evals) '[]'$  - 0 Comprehensive Assessment (HX, ROS, Risk Assessments, Wounds Hx, etc.) ASSESSMENTS - Wound and Skin Assessment / Reassessment '[]'$  - Dermatologic / Skin Assessment (not related to wound area) 0 ASSESSMENTS - Ostomy and/or Continence Assessment and Care '[]'$  - Incontinence Assessment and Management 0 '[]'$  - 0 Ostomy Care Assessment and Management (repouching, etc.) PROCESS - Coordination of Care '[]'$  - Simple Patient / Family Education for ongoing care 0 '[]'$  - 0 Complex (extensive) Patient / Family Education for ongoing care '[]'$  - 0 Staff obtains Programmer, systems, Records, Test Results / Process Orders '[]'$  - 0 Staff telephones HHA, Nursing Homes / Clarify orders / etc '[]'$  - 0 Routine Transfer to another Facility (non-emergent condition) '[]'$  - 0 Routine Hospital Admission (non-emergent condition) '[]'$  - 0 New Admissions / Biomedical engineer / Ordering NPWT, Apligraf, etc. '[]'$  - 0 Emergency Hospital Admission (emergent condition) PROCESS - Special Needs '[]'$  - Pediatric / Minor Patient Management 0 '[]'$  - 0 Isolation Patient Management '[]'$  - 0 Hearing / Language / Visual special needs '[]'$  - 0 Assessment of Community assistance (transportation, D/C planning, etc.) '[]'$  - 0 Additional assistance / Altered mentation '[]'$  - 0 Support Surface(s) Assessment (bed, cushion, seat, etc.) INTERVENTIONS - Miscellaneous '[]'$  - External ear exam 0 '[]'$  - 0 Patient Transfer (multiple staff / Civil Service fast streamer / Similar devices) '[]'$  - 0 Simple Staple / Suture removal (25 or less) '[]'$  - 0 Complex Staple / Suture removal (26 or more) '[]'$  - 0 Hypo/Hyperglycemic Management (do not check if billed  separately) '[]'$  - 0 Ankle / Brachial Index (ABI) - do not check if billed separately Has the patient been seen at the hospital within  the last three years: Yes Total Score: 0 Level Of Care: ____ Verhoeven, Jaiyanna L. ([id number]) Electronic Signature(s) Signed: 07/31/2020 4:32:42 PM By: Georges Mouse, Minus Breeding RN Entered By: Georges Mouse, Minus Breeding on 07/31/2020 12:11:21 Harshberger, Latash L. ([id number]) -------------------------------------------------------------------------------- Encounter Discharge Information Details Patient Name: [name], [name] Date of Service: 07/31/2020 11:15 AM Medical Record Number: [id number] Patient Account Number: [id number] Date of Birth/Sex: 03/20/1926 (87 y.o. F) Treating RN: Donnamarie Poag Primary Care Mattheus Rauls: [name] Other Clinician: Referring Tonita Bills: [name] Treating Azoria Abbett/Extender: Skipper Cliche in Treatment: 5 Encounter Discharge Information Items Post Procedure Vitals Discharge Condition: Stable Temperature (F): 97.8 Ambulatory Status: Wheelchair Pulse (bpm): 85 Discharge Destination: Home Respiratory Rate (breaths/min): 18 Transportation: Private Auto Blood Pressure (mmHg): 136/67 Accompanied By: daughter Schedule Follow-up Appointment: Yes Clinical Summary of Care: Electronic Signature(s) Signed: 07/31/2020 4:29:31 PM By: Donnamarie Poag Entered ByDonnamarie Poag on 07/31/2020 12:19:46 Frier, Latisa L. ([id number]) -------------------------------------------------------------------------------- Lower Extremity Assessment Details Patient Name: [name], [name] Date of Service: 07/31/2020 11:15 AM Medical Record Number: [id number] Patient Account Number: [id number] Date of Birth/Sex: 05/01/1926 (87 y.o. F) Treating RN: Carlene Coria Primary Care Monia Timmers: [name] Other Clinician: Referring Demari Gales: [name] Treating Geronimo Diliberto/Extender: Skipper Cliche in Treatment: 5 Edema Assessment Assessed: [Left: No]  [Right: No] Edema: [Left: Ye] [Right: s] Calf Left: Right: Point of Measurement: 32 cm From Medial Instep 35 cm Ankle Left: Right: Point of Measurement: 12 cm From Medial Instep 25 cm Vascular Assessment Pulses: Dorsalis Pedis Palpable: [Left:Yes] Electronic Signature(s) Signed: 08/01/2020 3:34:38 PM By: Carlene Coria RN Entered By: Carlene Coria on 07/31/2020 12:01:17 Glasby, Labrittany L. ([id number]) -------------------------------------------------------------------------------- Multi Wound Chart Details Patient Name: Kasik, Timberlynn L. Date of Service: 07/31/2020 11:15 AM Medical Record Number: [id number] Patient Account Number: [id number] Date of Birth/Sex: 08-26-1926 (87 y.o. F) Treating RN: Dolan Amen Primary Care Elisandra Deshmukh: [name] Other Clinician: Referring Kais Monje: [name] Treating Maryana Pittmon/Extender: Skipper Cliche in Treatment: 5 Vital Signs Height(in): 58 Pulse(bpm): 39 Weight(lbs): 137 Blood Pressure(mmHg): 136/67 Body Mass Index(BMI): 29 Temperature(F): 98.1 Respiratory Rate(breaths/min): 18 Photos: [N/A:N/A] Wound Location: Left, Medial Calcaneus N/A N/A Wounding Event: Pressure Injury N/A N/A Primary Etiology: Pressure Ulcer N/A N/A Comorbid History: Cataracts, Anemia, Coronary Artery N/A N/A Disease, Hypertension, Myocardial Infarction, End Stage Renal Disease, Gout, Osteoarthritis, Dementia Date Acquired: 04/11/2020 N/A N/A Weeks of Treatment: 5 N/A N/A Wound Status: Open N/A N/A Measurements L x W x D (cm) 1.5x1x0.6 N/A N/A Area (cm) : 1.178 N/A N/A Volume (cm) : 0.707 N/A N/A % Reduction in Area: 0.00% N/A N/A % Reduction in Volume: -50.10% N/A N/A Classification: Category/Stage III N/A N/A Exudate Amount: Medium N/A N/A Exudate Type: Serosanguineous N/A N/A Exudate Color: red, brown N/A N/A Granulation Amount: Medium (34-66%) N/A N/A Granulation Quality: Pink, Pale N/A N/A Necrotic Amount: Medium (34-66%) N/A N/A Exposed  Structures: Fat Layer (Subcutaneous Tissue): N/A N/A Yes Fascia: No Tendon: No Muscle: No Joint: No Bone: No Epithelialization: None N/A N/A Treatment Notes Electronic Signature(s) Signed: 07/31/2020 4:32:42 PM By: Georges Mouse, Minus Breeding RN Entered By: Georges Mouse, Minus Breeding on 07/31/2020 12:10:11 Newmark, Donnalee L. ([id number]) -------------------------------------------------------------------------------- [place] Details Patient Name: Slatter, Azaliah L. Date of Service: 07/31/2020 11:15 AM Medical Record Number: [id number] Patient Account Number: [id number] Date of Birth/Sex: 1926/10/29 (87 y.o. F) Treating RN: Dolan Amen Primary Care Roann Merk: [name] Other Clinician: Referring Tal Neer: [name] Treating Jordanny Waddington/Extender: Skipper Cliche in Treatment: 5 Active Inactive Wound/Skin Impairment Nursing Diagnoses: Impaired tissue integrity Goals: Patient/caregiver will verbalize understanding  of skin care regimen Date Initiated: 06/26/2020 Target Resolution Date: 06/26/2020 Goal Status: Active Ulcer/skin breakdown will have a volume reduction of 30% by week 4 Date Initiated: 06/26/2020 Target Resolution Date: 07/27/2020 Goal Status: Active Ulcer/skin breakdown will have a volume reduction of 50% by week 8 Date Initiated: 06/26/2020 Target Resolution Date: 08/26/2020 Goal Status: Active Ulcer/skin breakdown will have a volume reduction of 80% by week 12 Date Initiated: 06/26/2020 Target Resolution Date: 09/26/2020 Goal Status: Active Ulcer/skin breakdown will heal within 14 weeks Date Initiated: 06/26/2020 Target Resolution Date: 10/27/2020 Goal Status: Active Interventions: Assess patient/caregiver ability to obtain necessary supplies Assess patient/caregiver ability to perform ulcer/skin care regimen upon admission and as needed Assess ulceration(s) every visit Provide education on ulcer and skin care Treatment Activities: Referred to  DME Yakov Bergen for dressing supplies : 06/26/2020 Skin care regimen initiated : 06/26/2020 Notes: Electronic Signature(s) Signed: 07/31/2020 4:32:42 PM By: Georges Mouse, Minus Breeding RN Entered By: Georges Mouse, Minus Breeding on 07/31/2020 12:10:01 Hanahan, Janashia L. ([id number]) -------------------------------------------------------------------------------- Pain Assessment Details Patient Name: Vivian, Aquinnah L. Date of Service: 07/31/2020 11:15 AM Medical Record Number: [id number] Patient Account Number: [id number] Date of Birth/Sex: [birth year] (87 y.o. F) Treating RN: Carlene Coria Primary Care Derren Suydam: [name] Other Clinician: Referring Rodgers Likes: [name] Treating Kalim Kissel/Extender: Skipper Cliche in Treatment: 5 Active Problems Location of Pain Severity and Description of Pain Patient Has Paino No Site Locations Pain Management and Medication Current Pain Management: Electronic Signature(s) Signed: 08/01/2020 3:34:38 PM By: Carlene Coria RN Entered By: Carlene Coria on 07/31/2020 11:59:39 Petitti, Roisin L. ([id number]) -------------------------------------------------------------------------------- Patient/Caregiver Education Details Patient Name: Duren, Ineta L. Date of Service: 07/31/2020 11:15 AM Medical Record Number: [id number] Patient Account Number: [id number] Date of Birth/Gender: 20-Dec-1926 (87 y.o. F) Treating RN: Dolan Amen Primary Care Physician: [name] Other Clinician: Referring Physician: [name] Treating Physician/Extender: Skipper Cliche in Treatment: 5 Education Assessment Education Provided To: Patient Education Topics Provided Wound/Skin Impairment: Methods: Explain/Verbal Responses: State content correctly Electronic Signature(s) Signed: 07/31/2020 4:32:42 PM By: Georges Mouse, Minus Breeding RN Entered By: Georges Mouse, Minus Breeding on 07/31/2020 12:11:38 Hoffert, Griswold  ([id number]) -------------------------------------------------------------------------------- Wound Assessment Details Patient Name: Forness, Cyani L. Date of Service: 07/31/2020 11:15 AM Medical Record Number: [id number] Patient Account Number: [id number] Date of Birth/Sex: [birth year] (87 y.o. F) Treating RN: Carlene Coria Primary Care Bevely Hackbart: [name] Other Clinician: Referring Kiona Blume: [name] Treating Kiaan Overholser/Extender: Skipper Cliche in Treatment: 5 Wound Status Wound Number: 2 Primary Pressure Ulcer Etiology: Wound Location: Left, Medial Calcaneus Wound Open Wounding Event: Pressure Injury Status: Date Acquired: 04/11/2020 Comorbid Cataracts, Anemia, Coronary Artery Disease, Weeks Of Treatment: 5 History: Hypertension, Myocardial Infarction, End Stage Renal Clustered Wound: No Disease, Gout, Osteoarthritis, Dementia Photos Wound Measurements Length: (cm) 1.5 Width: (cm) 1 Depth: (cm) 0.6 Area: (cm) 1.178 Volume: (cm) 0.707 % Reduction in Area: 0% % Reduction in Volume: -50.1% Epithelialization: None Tunneling: No Undermining: No Wound Description Classification: Category/Stage III Exudate Amount: Medium Exudate Type: Serosanguineous Exudate Color: red, brown Foul Odor After Cleansing: No Slough/Fibrino Yes Wound Bed Granulation Amount: Medium (34-66%) Exposed Structure Granulation Quality: Pink, Pale Fascia Exposed: No Necrotic Amount: Medium (34-66%) Fat Layer (Subcutaneous Tissue) Exposed: Yes Necrotic Quality: Adherent Slough Tendon Exposed: No Muscle Exposed: No Joint Exposed: No Bone Exposed: No Treatment Notes Wound #2 (Calcaneus) Wound Laterality: Left, Medial Cleanser Normal Saline Discharge Instruction: Wash your hands with soap and water. Remove old dressing, discard into plastic bag and place into trash. Cleanse the wound with Normal Saline prior to  applying a clean dressing using gauze sponges, not tissues or cotton balls.  Do not scrub or use excessive force. Pat dry using gauze sponges, not tissue or cotton balls. Guthridge, Erine L. ([id number]) Peri-Wound Care Topical Primary Dressing Iodosorb 40 (g) Discharge Instruction: Apply IodoSorb to wound bed only as directed. Secondary Dressing Mepilex Border Flex, 4x4 (in/in) Discharge Instruction: Apply to wound as directed. Do not cut. Secured With Compression Wrap Compression Stockings Environmental education officer) Signed: 08/01/2020 3:34:38 PM By: Carlene Coria RN Entered By: Carlene Coria on 07/31/2020 12:00:17 Caselli, Laquanda L. ([id number]) -------------------------------------------------------------------------------- Vitals Details Patient Name: Schum, Caedence L. Date of Service: 07/31/2020 11:15 AM Medical Record Number: [id number] Patient Account Number: [id number] Date of Birth/Sex: [birth year] (87 y.o. F) Treating RN: Carlene Coria Primary Care Calyn Sivils: [name] Other Clinician: Referring Rexanna Louthan: [name] Treating Greco Gastelum/Extender: Skipper Cliche in Treatment: 5 Vital Signs Time Taken: 11:59 Temperature (F): 98.1 Height (in): 58 Pulse (bpm): 85 Weight (lbs): 137 Respiratory Rate (breaths/min): 18 Body Mass Index (BMI): 28.6 Blood Pressure (mmHg): 136/67 Reference Range: 80 - 120 mg / dl Electronic Signature(s) Signed: 08/01/2020 3:34:38 PM By: Carlene Coria RN Entered By: Carlene Coria on 07/31/2020 11:59:32

## 2020-07-31 NOTE — Progress Notes (Addendum)
[name] [name], [name] ([id number]) Visit Report for 07/31/2020 Chief Complaint Document Details Patient Name: [name] [name], [name] Date of Service: 07/31/2020 11:15 AM Medical Record Number: [id number] Patient Account Number: [id number] Date of Birth/Sex: 10-Aug-1926 (87 y.o. F) Treating RN: Dolan Amen Primary Care Provider: [name] Other Clinician: Referring Provider: [name] Treating Provider/Extender: Skipper Cliche in Treatment: 5 Information Obtained from: Patient Chief Complaint Left Heel Pressure Ulcer Electronic Signature(s) Signed: 07/31/2020 11:28:56 AM By: Worthy Keeler PA-C Entered By: Worthy Keeler on 07/31/2020 11:28:55 [name] [name], [name] ([id number]) -------------------------------------------------------------------------------- Debridement Details Patient Name: Elling, Tniyah [name]. Date of Service: 07/31/2020 11:15 AM Medical Record Number: [id number] Patient Account Number: [id number] Date of Birth/Sex: 1926/05/06 (87 y.o. F) Treating RN: Dolan Amen Primary Care Provider: [name] Other Clinician: Referring Provider: [name] Treating Provider/Extender: Skipper Cliche in Treatment: 5 Debridement Performed for Wound #2 Left,Medial Calcaneus Assessment: Performed By: Physician Tommie Sams., PA-C Debridement Type: Debridement Level of Consciousness (Pre- Awake and Alert procedure): Pre-procedure Verification/Time Out Yes - 12:10 Taken: Start Time: 12:10 Total Area Debrided ([name] x W): 1.5 (cm) x 1 (cm) = 1.5 (cm) Tissue and other material Viable, Non-Viable, Slough, Subcutaneous, Slough debrided: Level: Skin/Subcutaneous Tissue Debridement Description: Excisional Instrument: Curette Bleeding: Minimum Hemostasis Achieved: Pressure Response to Treatment: Procedure was tolerated well Level of Consciousness (Post- Awake and Alert procedure): Post Debridement Measurements of Total Wound Length: (cm) 1.5 Stage: Category/Stage  III Width: (cm) 1 Depth: (cm) 0.7 Volume: (cm) 0.825 Character of Wound/Ulcer Post Debridement: Stable Post Procedure Diagnosis Same as Pre-procedure Electronic Signature(s) Signed: 07/31/2020 4:32:42 PM By: Georges Mouse, Minus Breeding RN Signed: 08/02/2020 4:01:31 PM By: Worthy Keeler PA-C Entered By: Georges Mouse, Minus Breeding on 07/31/2020 12:10:52 [name] [name], [name] ([id number]) -------------------------------------------------------------------------------- HPI Details Patient Name: [name] [name], [name] Date of Service: 07/31/2020 11:15 AM Medical Record Number: [id number] Patient Account Number: [id number] Date of Birth/Sex: [birth year] (87 y.o. F) Treating RN: Dolan Amen Primary Care Provider: [name] Other Clinician: Referring Provider: [name] Treating Provider/Extender: Skipper Cliche in Treatment: 5 History of Present Illness HPI Description: 87 year old patient was recently seen by the PCPs office for significant pain right great toe which has been going on since July. She was initially treated with Keflex which she did not complete and after the office visit this time she has been put on doxycycline. at the time of her visit she was found to have a ulcer on the plantar surface of the right great toe and also had a pyogenic granuloma over this area. X-ray of the right foot done 12/29/2014 -- IMPRESSION:Soft-tissue swelling and ulceration right great toe. No underlying bony lytic lesion identified. If osteomyelitis remains of clinical concern MRI can be obtained. Past medical history significant for anemia, chronic kidney disease stage III, obesity, varicose veins, coronary artery disease, gout, history of nicotine addiction given up smoking in 2002, hypertension, status post cardiac catheterization, status post abdominal hysterectomy, cholecystectomy and tonsillectomy. hemoglobin A1c done in August was 5.8 01/22/2015 -- at this stage the [facility] Walking boat was going to  cost them significant amount of money and they want to defer using that at the present time. 01/29/2015 -- she had a podiatry appointment and they have trimmed her toenails. She has not heard back from the vascular office regarding her venous duplex study and I have asked them to call personally so that they can get the appointment soon. 02/06/2015 -- they have made contact with the vascular office and from what I understand  that test has been done but the report is pending. 02/19/2014 -- the vascular test is scheduled for tomorrow Readmission: 06/26/2020 upon evaluation today patient appears for initial evaluation in her clinic that she has been here before in 2016 and has been quite sometime. She did have a fractured hip in February on the 23rd 2022. Subsequently this had to be pinned and she ended up with a pressure injury on her heel following when she was using her foot to help move her around in the bed. Subsequently this has led to the wound that she has been dealing with since that time. She lives at home with her daughter currently she does have dementia. 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 07/03/2020 upon evaluation today patient appears to be doing well with regard to her heel ulcer. She did have arterial studies they appear to be doing excellent it was premature normal across the board with TBI's in the 90s and ABIs well within normal range. Nonetheless there does not appear to be any signs of arterial insufficiency whatsoever. With that being said the patient does have also signs of improvement there is some necrotic tissue in the base of the wound number to try to clear some of that away today I do believe the Iodoflex/Iodosorb is doing well. 5/24; difficult punched out wound on the left medial heel. She has been using Iodoflex 07/17/2020 upon  evaluation today patient appears to be doing well at this point in regard to her wound. I do feel like this is a little bit deeper but again that is what is expected as we continue with the Iodoflex I think that this is just going to get deeper until we get to the base of the wound. With that being said I think we are getting much closer to the base of the wound where we can have healthier tissue that we get a be managing here which that will be awesome. In the meantime I am not surprised by what I am seeing and in fact the wound appears to be better as compared to previous findings. 07/24/2020 upon evaluation today patient appears to be doing well with regard to her wound. Overall I am extremely pleased with where things stand today. I do not see any signs of active infection which is great and overall I think that the patient is making good progress. There is good to be some need for sharp debridement today. 07/31/2020 upon evaluation today patient appears to be doing better in regard to her heel ulcer. She has been tolerating the dressing changes without complication. Fortunately there does not appear to be any signs of active infection which is great and overall very pleased in this regard. No fevers, chills, nausea, vomiting, or diarrhea. Electronic Signature(s) Signed: 07/31/2020 1:35:15 PM By: Worthy Keeler PA-C Entered By: Worthy Keeler on 07/31/2020 13:35:14 [name] [name], [name] ([id number]) -------------------------------------------------------------------------------- Physical Exam Details Patient Name: Prather, Gabrianna [name]. Date of Service: 07/31/2020 11:15 AM Medical Record Number: [id number] Patient Account Number: [id number] Date of Birth/Sex: May 14, 1926 (87 y.o. F) Treating RN: Dolan Amen Primary Care Provider: [name] Other Clinician: Referring Provider: [name] Treating Provider/Extender: Skipper Cliche in Treatment: 5 Constitutional Well-nourished and  well-hydrated in no acute distress. Respiratory normal breathing without difficulty. Psychiatric this patient is able to make decisions and demonstrates good insight into disease process. Alert and Oriented  x 3. pleasant and cooperative. Notes Upon inspection patient's wound bed actually showed signs of good granulation epithelization at this point in some areas there was some slough noted as well however. Sharp debridement is good to be necessary to try to clear some of this away. The Iodoflex does seem to be doing a good job at helping to clean up the surface of the wound although I do believe we probably need to continue this a bit longer. Electronic Signature(s) Signed: 07/31/2020 1:35:36 PM By: Worthy Keeler PA-C Entered By: Worthy Keeler on 07/31/2020 13:35:35 [name] [name], [name] ([id number]) -------------------------------------------------------------------------------- Physician Orders Details Patient Name: Tusing, Sophea [name]. Date of Service: 07/31/2020 11:15 AM Medical Record Number: [id number] Patient Account Number: [id number] Date of Birth/Sex: 1926-08-19 (87 y.o. F) Treating RN: Dolan Amen Primary Care Provider: [name] Other Clinician: Referring Provider: [name] Treating Provider/Extender: Skipper Cliche in Treatment: 5 Verbal / Phone Orders: No Diagnosis Coding ICD-10 Coding Code Description [phone] Pressure ulcer of left heel, stage 3 I10 Essential (primary) hypertension F01.50 Vascular dementia without behavioral disturbance I25.10 Atherosclerotic heart disease of native coronary artery without angina pectoris Follow-up Appointments o Return Appointment in 1 week. Bathing/ Shower/ Hygiene o May shower; gently cleanse wound with antibacterial soap, rinse and pat dry prior to dressing wounds Off-Loading o Other: - Offloading boots while in bed; Try pillow under sheet to keep it in place. Wound Treatment Wound #2 - Calcaneus Wound  Laterality: Left, Medial Cleanser: Normal Saline 3 x Per Week/30 Days Discharge Instructions: Wash your hands with soap and water. Remove old dressing, discard into plastic bag and place into trash. Cleanse the wound with Normal Saline prior to applying a clean dressing using gauze sponges, not tissues or cotton balls. Do not scrub or use excessive force. Pat dry using gauze sponges, not tissue or cotton balls. Primary Dressing: Iodosorb 40 (g) (Generic) 3 x Per Week/30 Days Discharge Instructions: Apply IodoSorb to wound bed only as directed. Secondary Dressing: Mepilex Border Flex, 4x4 (in/in) (Generic) 3 x Per Week/30 Days Discharge Instructions: Apply to wound as directed. Do not cut. Electronic Signature(s) Signed: 07/31/2020 4:32:42 PM By: Georges Mouse, Minus Breeding RN Signed: 08/02/2020 4:01:31 PM By: Worthy Keeler PA-C Entered By: Georges Mouse, Minus Breeding on 07/31/2020 12:11:15 [name] [name], [name] ([id number]) -------------------------------------------------------------------------------- Problem List Details Patient Name: Zepeda, Gaylin [name]. Date of Service: 07/31/2020 11:15 AM Medical Record Number: [id number] Patient Account Number: [id number] Date of Birth/Sex: [birth year] (87 y.o. F) Treating RN: Dolan Amen Primary Care Provider: [name] Other Clinician: Referring Provider: [name] Treating Provider/Extender: Skipper Cliche in Treatment: 5 Active Problems ICD-10 Encounter Code Description Active Date MDM Diagnosis [phone] Pressure ulcer of left heel, stage 3 06/26/2020 No Yes I10 Essential (primary) hypertension 06/26/2020 No Yes F01.50 Vascular dementia without behavioral disturbance 06/26/2020 No Yes I25.10 Atherosclerotic heart disease of native coronary artery without angina 06/26/2020 No Yes pectoris Inactive Problems Resolved Problems Electronic Signature(s) Signed: 07/31/2020 11:28:50 AM By: Worthy Keeler PA-C Entered By: Worthy Keeler on 07/31/2020  11:28:50 [name] [name], [name] ([id number]) -------------------------------------------------------------------------------- Progress Note Details Patient Name: [name] [name], [name] Date of Service: 07/31/2020 11:15 AM Medical Record Number: [id number] Patient Account Number: [id number] Date of Birth/Sex: 08-30-1926 (87 y.o. F) Treating RN: Dolan Amen Primary Care Provider: [name] Other Clinician: Referring Provider: [name] Treating Provider/Extender: Skipper Cliche in Treatment: 5 Subjective Chief Complaint Information obtained from Patient Left Heel Pressure Ulcer History of Present Illness (HPI) 87 year old patient was recently seen  by the PCPs office for significant pain right great toe which has been going on since July. She was initially treated with Keflex which she did not complete and after the office visit this time she has been put on doxycycline. at the time of her visit she was found to have a ulcer on the plantar surface of the right great toe and also had a pyogenic granuloma over this area. X-ray of the right foot done 12/29/2014 -- IMPRESSION:Soft-tissue swelling and ulceration right great toe. No underlying bony lytic lesion identified. If osteomyelitis remains of clinical concern MRI can be obtained. Past medical history significant for anemia, chronic kidney disease stage III, obesity, varicose veins, coronary artery disease, gout, history of nicotine addiction given up smoking in 2002, hypertension, status post cardiac catheterization, status post abdominal hysterectomy, cholecystectomy and tonsillectomy. hemoglobin A1c done in August was 5.8 01/22/2015 -- at this stage the [facility] Walking boat was going to cost them significant amount of money and they want to defer using that at the present time. 01/29/2015 -- she had a podiatry appointment and they have trimmed her toenails. She has not heard back from the vascular office regarding her venous duplex  study and I have asked them to call personally so that they can get the appointment soon. 02/06/2015 -- they have made contact with the vascular office and from what I understand that test has been done but the report is pending. 02/19/2014 -- the vascular test is scheduled for tomorrow Readmission: 06/26/2020 upon evaluation today patient appears for initial evaluation in her clinic that she has been here before in 2016 and has been quite sometime. She did have a fractured hip in February on the 23rd 2022. Subsequently this had to be pinned and she ended up with a pressure injury on her heel following when she was using her foot to help move her around in the bed. Subsequently this has led to the wound that she has been dealing with since that time. She lives at home with her daughter currently she does have dementia. 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 07/03/2020 upon evaluation today patient appears to be doing well with regard to her heel ulcer. She did have arterial studies they appear to be doing excellent it was premature normal across the board with TBI's in the 90s and ABIs well within normal range. Nonetheless there does not appear to be any signs of arterial insufficiency whatsoever. With that being said the patient does have also signs of improvement there is some necrotic tissue in the base of the wound number to try to clear some of that away today I do believe the Iodoflex/Iodosorb is doing well. 5/24; difficult punched out wound on the left medial heel. She has been using Iodoflex 07/17/2020 upon evaluation today patient appears to be doing well at this point in regard to her wound. I do feel like this is a little bit deeper but again that is what is expected as we continue with the Iodoflex I think that this is just going to get deeper until we get  to the base of the wound. With that being said I think we are getting much closer to the base of the wound where we can have healthier tissue that we get a be managing here which that will be awesome. In the meantime I am not surprised by what I  am seeing and in fact the wound appears to be better as compared to previous findings. 07/24/2020 upon evaluation today patient appears to be doing well with regard to her wound. Overall I am extremely pleased with where things stand today. I do not see any signs of active infection which is great and overall I think that the patient is making good progress. There is good to be some need for sharp debridement today. 07/31/2020 upon evaluation today patient appears to be doing better in regard to her heel ulcer. She has been tolerating the dressing changes without complication. Fortunately there does not appear to be any signs of active infection which is great and overall very pleased in this regard. No fevers, chills, nausea, vomiting, or diarrhea. Objective Constitutional [name] [name], [name] ([id number]) Well-nourished and well-hydrated in no acute distress. Vitals Time Taken: 11:59 AM, Height: 58 in, Weight: 137 lbs, BMI: 28.6, Temperature: 98.1 F, Pulse: 85 bpm, Respiratory Rate: 18 breaths/min, Blood Pressure: 136/67 mmHg. Respiratory normal breathing without difficulty. Psychiatric this patient is able to make decisions and demonstrates good insight into disease process. Alert and Oriented x 3. pleasant and cooperative. General Notes: Upon inspection patient's wound bed actually showed signs of good granulation epithelization at this point in some areas there was some slough noted as well however. Sharp debridement is good to be necessary to try to clear some of this away. The Iodoflex does seem to be doing a good job at helping to clean up the surface of the wound although I do believe we probably need to continue this a bit longer. Integumentary  (Hair, Skin) Wound #2 status is Open. Original cause of wound was Pressure Injury. The date acquired was: 04/11/2020. The wound has been in treatment 5 weeks. The wound is located on the Left,Medial Calcaneus. The wound measures 1.5cm length x 1cm width x 0.6cm depth; 1.178cm^2 area and 0.707cm^3 volume. There is Fat Layer (Subcutaneous Tissue) exposed. There is no tunneling or undermining noted. There is a medium amount of serosanguineous drainage noted. There is medium (34-66%) pink, pale granulation within the wound bed. There is a medium (34-66%) amount of necrotic tissue within the wound bed including Adherent Slough. Assessment Active Problems ICD-10 Pressure ulcer of left heel, stage 3 Essential (primary) hypertension Vascular dementia without behavioral disturbance Atherosclerotic heart disease of native coronary artery without angina pectoris Procedures Wound #2 Pre-procedure diagnosis of Wound #2 is a Pressure Ulcer located on the Left,Medial Calcaneus . There was a Excisional Skin/Subcutaneous Tissue Debridement with a total area of 1.5 sq cm performed by Tommie Sams., PA-C. With the following instrument(s): Curette to remove Viable and Non-Viable tissue/material. Material removed includes Subcutaneous Tissue and Slough and. A time out was conducted at 12:10, prior to the start of the procedure. A Minimum amount of bleeding was controlled with Pressure. The procedure was tolerated well. Post Debridement Measurements: 1.5cm length x 1cm width x 0.7cm depth; 0.825cm^3 volume. Post debridement Stage noted as Category/Stage III. Character of Wound/Ulcer Post Debridement is stable. Post procedure Diagnosis Wound #2: Same as Pre-Procedure Plan Follow-up Appointments: Return Appointment in 1 week. Bathing/ Shower/ Hygiene: May shower; gently cleanse wound with antibacterial soap, rinse and pat dry prior to dressing wounds Off-Loading: Other: - Offloading boots while in bed; Try  pillow under sheet to keep it in place. WOUND #2: - Calcaneus Wound Laterality: Left, Medial Cleanser: Normal Saline 3 x Per Week/30 Days Discharge Instructions: Wash your hands with soap and water. Remove old dressing, discard  into plastic bag and place into trash. Cleanse the wound with Normal Saline prior to applying a clean dressing using gauze sponges, not tissues or cotton balls. Do not scrub or use excessive force. Pat dry using gauze sponges, not tissue or cotton balls. Primary Dressing: Iodosorb 40 (g) (Generic) 3 x Per Week/30 Days Discharge Instructions: Apply IodoSorb to wound bed only as directed. Secondary Dressing: Mepilex Border Flex, 4x4 (in/in) (Generic) 3 x Per Week/30 Days Discharge Instructions: Apply to wound as directed. Do not cut. [name] [name], [name] ([id number]) 1. Would recommend currently that we continue with the Iodosorb I think this is probably the best way to go and this is what they have been doing at home and I think she is tolerating this in an excellent fashion it is definitely cleaning up the surface of the wound were getting to the point of seeing some better granulation at the base of the wound bed. 2. I am also can recommend at this point that we continue with the border foam dressing to cover I think that is doing a great job as well. 3. I would also recommend that this continue to be changed 3 times per week I think that is an absolute appropriate regimen. We will see patient back for reevaluation in 1 week here in the clinic. If anything worsens or changes patient will contact our office for additional recommendations. Electronic Signature(s) Signed: 07/31/2020 1:36:12 PM By: Worthy Keeler PA-C Entered By: Worthy Keeler on 07/31/2020 13:36:12 Michon, Embrie [name]. ([id number]) -------------------------------------------------------------------------------- SuperBill Details Patient Name: Shimmin, Elara [name]. Date of Service: 07/31/2020 Medical Record  Number: [id number] Patient Account Number: [id number] Date of Birth/Sex: 02-Aug-1926 (87 y.o. F) Treating RN: Dolan Amen Primary Care Provider: [name] Other Clinician: Referring Provider: [name] Treating Provider/Extender: Skipper Cliche in Treatment: 5 Diagnosis Coding ICD-10 Codes Code Description [phone] Pressure ulcer of left heel, stage 3 I10 Essential (primary) hypertension F01.50 Vascular dementia without behavioral disturbance I25.10 Atherosclerotic heart disease of native coronary artery without angina pectoris Facility Procedures CPT4 Code: [id number] Description: [id number] - DEB SUBQ TISSUE 20 SQ CM/< Modifier: Quantity: 1 CPT4 Code: Description: ICD-10 Diagnosis Description L89.623 Pressure ulcer of left heel, stage 3 Modifier: Quantity: Physician Procedures CPT4 Code: [id number] Description: 11042 - WC PHYS SUBQ TISS 20 SQ CM Modifier: Quantity: 1 CPT4 Code: Description: ICD-10 Diagnosis Description L89.623 Pressure ulcer of left heel, stage 3 Modifier: Quantity: Electronic Signature(s) Signed: 07/31/2020 1:36:21 PM By: Worthy Keeler PA-C Entered By: Worthy Keeler on 07/31/2020 13:36:21

## 2020-08-07 ENCOUNTER — Other Ambulatory Visit: Payer: Self-pay

## 2020-08-07 ENCOUNTER — Encounter: Payer: PPO | Admitting: Physician Assistant

## 2020-08-07 DIAGNOSIS — H353211 Exudative age-related macular degeneration, right eye, with active choroidal neovascularization: Secondary | ICD-10-CM | POA: Diagnosis not present

## 2020-08-07 DIAGNOSIS — Z961 Presence of intraocular lens: Secondary | ICD-10-CM | POA: Diagnosis not present

## 2020-08-07 DIAGNOSIS — L89623 Pressure ulcer of left heel, stage 3: Secondary | ICD-10-CM | POA: Diagnosis not present

## 2020-08-07 NOTE — Progress Notes (Addendum)
[name] [name], [name] ([id number]) Visit Report for 08/07/2020 Chief Complaint Document Details Patient Name: [name], [name] Date of Service: 08/07/2020 2:00 PM Medical Record Number: [id number] Patient Account Number: [id number] Date of Birth/Sex: 1926-10-29 (87 y.o. F) Treating RN: Dolan Amen Primary Care Provider: [name] Other Clinician: Referring Provider: [name] Treating Provider/Extender: Skipper Cliche in Treatment: 6 Information Obtained from: Patient Chief Complaint Left Heel Pressure Ulcer Electronic Signature(s) Signed: 08/07/2020 2:50:09 PM By: Worthy Keeler PA-C Entered By: Worthy Keeler on 08/07/2020 14:50:09 [name] [name], [name] ([id number]) -------------------------------------------------------------------------------- Debridement Details Patient Name: [name], [name] Date of Service: 08/07/2020 2:00 PM Medical Record Number: [id number] Patient Account Number: [id number] Date of Birth/Sex: 1926-12-17 (87 y.o. F) Treating RN: Dolan Amen Primary Care Provider: [name] Other Clinician: Referring Provider: [name] Treating Provider/Extender: Skipper Cliche in Treatment: 6 Debridement Performed for Wound #2 Left,Medial Calcaneus Assessment: Performed By: Physician Tommie Sams., PA-C Debridement Type: Debridement Level of Consciousness (Pre- Awake and Alert procedure): Pre-procedure Verification/Time Out Yes - 14:57 Taken: Start Time: 14:57 Total Area Debrided (L x W): 1.1 (cm) x 1.5 (cm) = 1.65 (cm) Tissue and other material Viable, Non-Viable, Slough, Subcutaneous, Slough debrided: Level: Skin/Subcutaneous Tissue Debridement Description: Excisional Instrument: Curette Bleeding: Minimum Hemostasis Achieved: Pressure Response to Treatment: Procedure was tolerated well Level of Consciousness (Post- Awake and Alert procedure): Post Debridement Measurements of Total Wound Length: (cm) 1.1 Stage: Category/Stage  III Width: (cm) 1.5 Depth: (cm) 0.7 Volume: (cm) 0.907 Character of Wound/Ulcer Post Debridement: Stable Post Procedure Diagnosis Same as Pre-procedure Electronic Signature(s) Signed: 08/07/2020 4:15:25 PM By: Charlett Nose RN Signed: 08/07/2020 7:32:25 PM By: Worthy Keeler PA-C Entered By: Georges Mouse, Minus Breeding on 08/07/2020 14:58:30 [name] [name], [name] ([id number]) -------------------------------------------------------------------------------- HPI Details Patient Name: [name], [name] Date of Service: 08/07/2020 2:00 PM Medical Record Number: [id number] Patient Account Number: [id number] Date of Birth/Sex: Apr 12, 1926 (87 y.o. F) Treating RN: Dolan Amen Primary Care Provider: [name] Other Clinician: Referring Provider: [name] Treating Provider/Extender: Skipper Cliche in Treatment: 6 History of Present Illness HPI Description: 87 year old patient was recently seen by the PCPs office for significant pain right great toe which has been going on since July. She was initially treated with Keflex which she did not complete and after the office visit this time she has been put on doxycycline. at the time of her visit she was found to have a ulcer on the plantar surface of the right great toe and also had a pyogenic granuloma over this area. X-ray of the right foot done 12/29/2014 -- IMPRESSION:Soft-tissue swelling and ulceration right great toe. No underlying bony lytic lesion identified. If osteomyelitis remains of clinical concern MRI can be obtained. Past medical history significant for anemia, chronic kidney disease stage III, obesity, varicose veins, coronary artery disease, gout, history of nicotine addiction given up smoking in 2002, hypertension, status post cardiac catheterization, status post abdominal hysterectomy, cholecystectomy and tonsillectomy. hemoglobin A1c done in August was 5.8 01/22/2015 -- at this stage the [facility] Walking boat was going  to cost them significant amount of money and they want to defer using that at the present time. 01/29/2015 -- she had a podiatry appointment and they have trimmed her toenails. She has not heard back from the vascular office regarding her venous duplex study and I have asked them to call personally so that they can get the appointment soon. 02/06/2015 -- they have made contact with the vascular office and from what I understand  that test has been done but the report is pending. 02/19/2014 -- the vascular test is scheduled for tomorrow Readmission: 06/26/2020 upon evaluation today patient appears for initial evaluation in her clinic that she has been here before in 2016 and has been quite sometime. She did have a fractured hip in February on the 23rd 2022. Subsequently this had to be pinned and she ended up with a pressure injury on her heel following when she was using her foot to help move her around in the bed. Subsequently this has led to the wound that she has been dealing with since that time. She lives at home with her daughter currently she does have dementia. 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 07/03/2020 upon evaluation today patient appears to be doing well with regard to her heel ulcer. She did have arterial studies they appear to be doing excellent it was premature normal across the board with TBI's in the 90s and ABIs well within normal range. Nonetheless there does not appear to be any signs of arterial insufficiency whatsoever. With that being said the patient does have also signs of improvement there is some necrotic tissue in the base of the wound number to try to clear some of that away today I do believe the Iodoflex/Iodosorb is doing well. 5/24; difficult punched out wound on the left medial heel. She has been using Iodoflex 07/17/2020 upon  evaluation today patient appears to be doing well at this point in regard to her wound. I do feel like this is a little bit deeper but again that is what is expected as we continue with the Iodoflex I think that this is just going to get deeper until we get to the base of the wound. With that being said I think we are getting much closer to the base of the wound where we can have healthier tissue that we get a be managing here which that will be awesome. In the meantime I am not surprised by what I am seeing and in fact the wound appears to be better as compared to previous findings. 07/24/2020 upon evaluation today patient appears to be doing well with regard to her wound. Overall I am extremely pleased with where things stand today. I do not see any signs of active infection which is great and overall I think that the patient is making good progress. There is good to be some need for sharp debridement today. 07/31/2020 upon evaluation today patient appears to be doing better in regard to her heel ulcer. She has been tolerating the dressing changes without complication. Fortunately there does not appear to be any signs of active infection which is great and overall very pleased in this regard. No fevers, chills, nausea, vomiting, or diarrhea. Electronic Signature(s) Signed: 08/07/2020 6:43:05 PM By: Worthy Keeler PA-C Entered By: Worthy Keeler on 08/07/2020 18:43:05 [name] [name], [name] ([id number]) -------------------------------------------------------------------------------- Physical Exam Details Patient Name: [name] [name], [name] Date of Service: 08/07/2020 2:00 PM Medical Record Number: [id number] Patient Account Number: [id number] Date of Birth/Sex: [birth year] (87 y.o. F) Treating RN: Dolan Amen Primary Care Provider: [name] Other Clinician: Referring Provider: [name] Treating Provider/Extender: Skipper Cliche in Treatment: 6 Constitutional Well-nourished and  well-hydrated in no acute distress. Respiratory normal breathing without difficulty. Psychiatric this patient is able to make decisions and demonstrates good insight into disease process. Alert and Oriented  x 3. pleasant and cooperative. Notes Upon inspection patient's wound bed showed signs of good granulation epithelization at this point. I do not see any evidence of infection I did try to perform some sharp debridement to clear away some of the necrotic debris she tolerated that without complication postdebridement wound bed appears to be doing much better. Electronic Signature(s) Signed: 08/07/2020 6:43:22 PM By: Worthy Keeler PA-C Entered By: Worthy Keeler on 08/07/2020 18:43:22 [name] [name], [name] ([id number]) -------------------------------------------------------------------------------- Physician Orders Details Patient Name: [name] [name], [name] Date of Service: 08/07/2020 2:00 PM Medical Record Number: [id number] Patient Account Number: [id number] Date of Birth/Sex: 06/08/1926 (87 y.o. F) Treating RN: Dolan Amen Primary Care Provider: [name] Other Clinician: Referring Provider: [name] Treating Provider/Extender: Skipper Cliche in Treatment: 6 Verbal / Phone Orders: No Diagnosis Coding ICD-10 Coding Code Description [phone] Pressure ulcer of left heel, stage 3 I10 Essential (primary) hypertension F01.50 Vascular dementia without behavioral disturbance I25.10 Atherosclerotic heart disease of native coronary artery without angina pectoris Follow-up Appointments o Return Appointment in 1 week. Bathing/ Shower/ Hygiene o May shower; gently cleanse wound with antibacterial soap, rinse and pat dry prior to dressing wounds Non-Wound Condition o Additional non-wound orders/instructions: - Pad dorsal foot with ABD pad for protection Off-Loading o Other: - Offloading boots while in bed; Try pillow under sheet to keep it in place. Wound  Treatment Wound #2 - Calcaneus Wound Laterality: Left, Medial Cleanser: Soap and Water 1 x Per Week/30 Days Discharge Instructions: Gently cleanse wound with antibacterial soap, rinse and pat dry prior to dressing wounds Primary Dressing: Iodosorb 40 (g) (Generic) 1 x Per Week/30 Days Discharge Instructions: Apply IodoSorb to wound bed only as directed. Secondary Dressing: ABD Pad 5x9 (in/in) 1 x Per Week/30 Days Discharge Instructions: Cover with ABD pad. Compression Wrap: Profore Lite LF 3 Multilayer Compression Bandaging System 1 x Per Week/30 Days Discharge Instructions: Apply 3 multi-layer wrap as prescribed. Electronic Signature(s) Signed: 08/07/2020 4:15:25 PM By: Georges Mouse, Minus Breeding RN Signed: 08/07/2020 7:32:25 PM By: Worthy Keeler PA-C Entered By: Georges Mouse, Minus Breeding on 08/07/2020 15:02:22 [name] [name], [name] ([id number]) -------------------------------------------------------------------------------- Problem List Details Patient Name: [name] [name], [name] Date of Service: 08/07/2020 2:00 PM Medical Record Number: [id number] Patient Account Number: [id number] Date of Birth/Sex: 21-Jul-1926 (87 y.o. F) Treating RN: Dolan Amen Primary Care Provider: [name] Other Clinician: Referring Provider: [name] Treating Provider/Extender: Skipper Cliche in Treatment: 6 Active Problems ICD-10 Encounter Code Description Active Date MDM Diagnosis [phone] Pressure ulcer of left heel, stage 3 06/26/2020 No Yes I10 Essential (primary) hypertension 06/26/2020 No Yes F01.50 Vascular dementia without behavioral disturbance 06/26/2020 No Yes I25.10 Atherosclerotic heart disease of native coronary artery without angina 06/26/2020 No Yes pectoris Inactive Problems Resolved Problems Electronic Signature(s) Signed: 08/07/2020 2:50:03 PM By: Worthy Keeler PA-C Entered By: Worthy Keeler on 08/07/2020 14:50:02 [name] [name], [name]  ([id number]) -------------------------------------------------------------------------------- Progress Note Details Patient Name: [name] [name], [name] Date of Service: 08/07/2020 2:00 PM Medical Record Number: [id number] Patient Account Number: [id number] Date of Birth/Sex: 07-May-1926 (87 y.o. F) Treating RN: Dolan Amen Primary Care Provider: [name] Other Clinician: Referring Provider: [name] Treating Provider/Extender: Skipper Cliche in Treatment: 6 Subjective Chief Complaint Information obtained from Patient Left Heel Pressure Ulcer History of Present Illness (HPI) 87 year old patient was recently seen by the PCPs office for significant pain right great toe which has been going on since July. She was initially treated with Keflex which she did not complete and after  the office visit this time she has been put on doxycycline. at the time of her visit she was found to have a ulcer on the plantar surface of the right great toe and also had a pyogenic granuloma over this area. X-ray of the right foot done 12/29/2014 -- IMPRESSION:Soft-tissue swelling and ulceration right great toe. No underlying bony lytic lesion identified. If osteomyelitis remains of clinical concern MRI can be obtained. Past medical history significant for anemia, chronic kidney disease stage III, obesity, varicose veins, coronary artery disease, gout, history of nicotine addiction given up smoking in 2002, hypertension, status post cardiac catheterization, status post abdominal hysterectomy, cholecystectomy and tonsillectomy. hemoglobin A1c done in August was 5.8 01/22/2015 -- at this stage the [facility] Walking boat was going to cost them significant amount of money and they want to defer using that at the present time. 01/29/2015 -- she had a podiatry appointment and they have trimmed her toenails. She has not heard back from the vascular office regarding her venous duplex study and I have asked them to  call personally so that they can get the appointment soon. 02/06/2015 -- they have made contact with the vascular office and from what I understand that test has been done but the report is pending. 02/19/2014 -- the vascular test is scheduled for tomorrow Readmission: 06/26/2020 upon evaluation today patient appears for initial evaluation in her clinic that she has been here before in 2016 and has been quite sometime. She did have a fractured hip in February on the 23rd 2022. Subsequently this had to be pinned and she ended up with a pressure injury on her heel following when she was using her foot to help move her around in the bed. Subsequently this has led to the wound that she has been dealing with since that time. She lives at home with her daughter currently she does have dementia. 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 07/03/2020 upon evaluation today patient appears to be doing well with regard to her heel ulcer. She did have arterial studies they appear to be doing excellent it was premature normal across the board with TBI's in the 90s and ABIs well within normal range. Nonetheless there does not appear to be any signs of arterial insufficiency whatsoever. With that being said the patient does have also signs of improvement there is some necrotic tissue in the base of the wound number to try to clear some of that away today I do believe the Iodoflex/Iodosorb is doing well. 5/24; difficult punched out wound on the left medial heel. She has been using Iodoflex 07/17/2020 upon evaluation today patient appears to be doing well at this point in regard to her wound. I do feel like this is a little bit deeper but again that is what is expected as we continue with the Iodoflex I think that this is just going to get deeper until we get to the base of the wound. With  that being said I think we are getting much closer to the base of the wound where we can have healthier tissue that we get a be managing here which that will be awesome. In the meantime I am not surprised by what I am seeing and in fact the wound appears to be better as compared to previous findings. 07/24/2020 upon evaluation today patient appears to be doing well with regard to her  wound. Overall I am extremely pleased with where things stand today. I do not see any signs of active infection which is great and overall I think that the patient is making good progress. There is good to be some need for sharp debridement today. 07/31/2020 upon evaluation today patient appears to be doing better in regard to her heel ulcer. She has been tolerating the dressing changes without complication. Fortunately there does not appear to be any signs of active infection which is great and overall very pleased in this regard. No fevers, chills, nausea, vomiting, or diarrhea. Objective Constitutional Schulke, [name] ([id number]) Well-nourished and well-hydrated in no acute distress. Vitals Time Taken: 2:20 PM, Height: 58 in, Weight: 137 lbs, BMI: 28.6, Temperature: 98.9 F, Pulse: 98 bpm, Respiratory Rate: 18 breaths/min, Blood Pressure: 111/69 mmHg. Respiratory normal breathing without difficulty. Psychiatric this patient is able to make decisions and demonstrates good insight into disease process. Alert and Oriented x 3. pleasant and cooperative. General Notes: Upon inspection patient's wound bed showed signs of good granulation epithelization at this point. I do not see any evidence of infection I did try to perform some sharp debridement to clear away some of the necrotic debris she tolerated that without complication postdebridement wound bed appears to be doing much better. Integumentary (Hair, Skin) Wound #2 status is Open. Original cause of wound was Pressure Injury. The date acquired was: 04/11/2020.  The wound has been in treatment 6 weeks. The wound is located on the Left,Medial Calcaneus. The wound measures 1.1cm length x 1.5cm width x 0.6cm depth; 1.296cm^2 area and 0.778cm^3 volume. There is Fat Layer (Subcutaneous Tissue) exposed. There is no tunneling or undermining noted. There is a medium amount of serosanguineous drainage noted. There is medium (34-66%) pink, pale granulation within the wound bed. There is a medium (34- 66%) amount of necrotic tissue within the wound bed including Adherent Slough. Assessment Active Problems ICD-10 Pressure ulcer of left heel, stage 3 Essential (primary) hypertension Vascular dementia without behavioral disturbance Atherosclerotic heart disease of native coronary artery without angina pectoris Procedures Wound #2 Pre-procedure diagnosis of Wound #2 is a Pressure Ulcer located on the Left,Medial Calcaneus . There was a Excisional Skin/Subcutaneous Tissue Debridement with a total area of 1.65 sq cm performed by Tommie Sams., PA-C. With the following instrument(s): Curette to remove Viable and Non-Viable tissue/material. Material removed includes Subcutaneous Tissue and Slough and. A time out was conducted at 14:57, prior to the start of the procedure. A Minimum amount of bleeding was controlled with Pressure. The procedure was tolerated well. Post Debridement Measurements: 1.1cm length x 1.5cm width x 0.7cm depth; 0.907cm^3 volume. Post debridement Stage noted as Category/Stage III. Character of Wound/Ulcer Post Debridement is stable. Post procedure Diagnosis Wound #2: Same as Pre-Procedure Pre-procedure diagnosis of Wound #2 is a Pressure Ulcer located on the Left,Medial Calcaneus . There was a Three Layer Compression Therapy Procedure with a pre-treatment ABI of 1.2 by Dolan Amen, RN. Post procedure Diagnosis Wound #2: Same as Pre-Procedure Plan Follow-up Appointments: Return Appointment in 1 week. Bathing/ Shower/ Hygiene: May shower;  gently cleanse wound with antibacterial soap, rinse and pat dry prior to dressing wounds Non-Wound Condition: Additional non-wound orders/instructions: - Pad dorsal foot with ABD pad for protection Off-Loading: Other: - Offloading boots while in bed; Try pillow under sheet to keep it in place. WOUND #2: - Calcaneus Wound Laterality: Left, Medial Cleanser: Soap and Water 1 x Per Week/30 Days [name] [name], [name] ([id number]) Discharge Instructions: Gently cleanse  wound with antibacterial soap, rinse and pat dry prior to dressing wounds Primary Dressing: Iodosorb 40 (g) (Generic) 1 x Per Week/30 Days Discharge Instructions: Apply IodoSorb to wound bed only as directed. Secondary Dressing: ABD Pad 5x9 (in/in) 1 x Per Week/30 Days Discharge Instructions: Cover with ABD pad. Compression Wrap: Profore Lite LF 3 Multilayer Compression Bandaging System 1 x Per Week/30 Days Discharge Instructions: Apply 3 multi-layer wrap as prescribed. 1. Would recommend that we going continue with the wound care measures as before and the patient is in agreement the plan. This includes the use of the Iodosorb which I think is still the best way to go. 2. We also are going to initiate treatment with a 3 layer compression wrap I think this would be a good idea to try to help control some of the edema in this leg. 3. I am also can recommend she elevate the leg is much as possible try to help with edema control here as well. We will see patient back for reevaluation in 1 week here in the clinic. If anything worsens or changes patient will contact our office for additional recommendations. Electronic Signature(s) Signed: 08/07/2020 6:43:54 PM By: Worthy Keeler PA-C Entered By: Worthy Keeler on 08/07/2020 18:43:53 Leaver, Nyx L. ([id number]) -------------------------------------------------------------------------------- SuperBill Details Patient Name: Lucus, Kamber L. Date of Service: 08/07/2020 Medical Record  Number: [id number] Patient Account Number: [id number] Date of Birth/Sex: Jan 16, 1927 (87 y.o. F) Treating RN: Dolan Amen Primary Care Provider: [name] Other Clinician: Referring Provider: [name] Treating Provider/Extender: Skipper Cliche in Treatment: 6 Diagnosis Coding ICD-10 Codes Code Description [phone] Pressure ulcer of left heel, stage 3 I10 Essential (primary) hypertension F01.50 Vascular dementia without behavioral disturbance I25.10 Atherosclerotic heart disease of native coronary artery without angina pectoris Facility Procedures CPT4 Code: [id number] Description: [id number] - DEB SUBQ TISSUE 20 SQ CM/< Modifier: Quantity: 1 CPT4 Code: Description: ICD-10 Diagnosis Description L89.623 Pressure ulcer of left heel, stage 3 Modifier: Quantity: Physician Procedures CPT4 Code: [id number] Description: 11042 - WC PHYS SUBQ TISS 20 SQ CM Modifier: Quantity: 1 CPT4 Code: Description: ICD-10 Diagnosis Description L89.623 Pressure ulcer of left heel, stage 3 Modifier: Quantity: Electronic Signature(s) Signed: 08/07/2020 6:44:11 PM By: Worthy Keeler PA-C Previous Signature: 08/07/2020 4:15:25 PM Version By: Georges Mouse, Minus Breeding RN Entered By: Worthy Keeler on 08/07/2020 18:44:11

## 2020-08-08 NOTE — Progress Notes (Signed)
Camper, Wynema L. ([id number]) Visit Report for 08/07/2020 Arrival Information Details Patient Name: [name] [name], [name] Date of Service: 08/07/2020 2:00 PM Medical Record Number: [id number] Patient Account Number: [id number] Date of Birth/Sex: 1926/09/30 (87 y.o. F) Treating RN: Dolan Amen Primary Care Lutisha Knoche: [name] Other Clinician: Referring Ailana Cuadrado: [name] Treating Nigil Braman/Extender: Skipper Cliche in Treatment: 6 Visit Information History Since Last Visit Added or deleted any medications: No Patient Arrived: Wheel Chair Had a fall or experienced change in No Arrival Time: 14:18 activities of daily living that may affect Accompanied By: daughter risk of falls: Transfer Assistance: EasyPivot Patient Lift Hospitalized since last visit: No Patient Identification Verified: Yes Pain Present Now: No Secondary Verification Process Completed: Yes Patient Requires Transmission-Based No Precautions: Patient Has Alerts: Yes Patient Alerts: NOT diabetic ABI R1.18 L1.25 06/27/20 Electronic Signature(s) Signed: 08/07/2020 4:23:36 PM By: Jeanine Luz Entered By: Jeanine Luz on 08/07/2020 14:25:37 Hornik, Carma L. ([id number]) -------------------------------------------------------------------------------- Clinic Level of Care Assessment Details Patient Name: [name], [name] Date of Service: 08/07/2020 2:00 PM Medical Record Number: [id number] Patient Account Number: [id number] Date of Birth/Sex: Jun 05, 1926 (87 y.o. F) Treating RN: Dolan Amen Primary Care Armanie Ullmer: [name] Other Clinician: Referring Axel Frisk: [name] Treating Evetta Renner/Extender: Skipper Cliche in Treatment: 6 Clinic Level of Care Assessment Items TOOL 1 Quantity Score '[]'$  - Use when EandM and Procedure is performed on INITIAL visit 0 ASSESSMENTS - Nursing Assessment / Reassessment '[]'$  - General Physical Exam (combine w/ comprehensive assessment (listed just below)  when performed on new 0 pt. evals) '[]'$  - 0 Comprehensive Assessment (HX, ROS, Risk Assessments, Wounds Hx, etc.) ASSESSMENTS - Wound and Skin Assessment / Reassessment '[]'$  - Dermatologic / Skin Assessment (not related to wound area) 0 ASSESSMENTS - Ostomy and/or Continence Assessment and Care '[]'$  - Incontinence Assessment and Management 0 '[]'$  - 0 Ostomy Care Assessment and Management (repouching, etc.) PROCESS - Coordination of Care '[]'$  - Simple Patient / Family Education for ongoing care 0 '[]'$  - 0 Complex (extensive) Patient / Family Education for ongoing care '[]'$  - 0 Staff obtains Programmer, systems, Records, Test Results / Process Orders '[]'$  - 0 Staff telephones HHA, Nursing Homes / Clarify orders / etc '[]'$  - 0 Routine Transfer to another Facility (non-emergent condition) '[]'$  - 0 Routine Hospital Admission (non-emergent condition) '[]'$  - 0 New Admissions / Biomedical engineer / Ordering NPWT, Apligraf, etc. '[]'$  - 0 Emergency Hospital Admission (emergent condition) PROCESS - Special Needs '[]'$  - Pediatric / Minor Patient Management 0 '[]'$  - 0 Isolation Patient Management '[]'$  - 0 Hearing / Language / Visual special needs '[]'$  - 0 Assessment of Community assistance (transportation, D/C planning, etc.) '[]'$  - 0 Additional assistance / Altered mentation '[]'$  - 0 Support Surface(s) Assessment (bed, cushion, seat, etc.) INTERVENTIONS - Miscellaneous '[]'$  - External ear exam 0 '[]'$  - 0 Patient Transfer (multiple staff / Civil Service fast streamer / Similar devices) '[]'$  - 0 Simple Staple / Suture removal (25 or less) '[]'$  - 0 Complex Staple / Suture removal (26 or more) '[]'$  - 0 Hypo/Hyperglycemic Management (do not check if billed separately) '[]'$  - 0 Ankle / Brachial Index (ABI) - do not check if billed separately Has the patient been seen at the hospital within the last three years: Yes Total Score: 0 Level Of Care: ____ Leighty, Illene L. ([id number]) Electronic Signature(s) Signed: 08/07/2020 4:15:25 PM By: Georges Mouse, Minus Breeding RN Entered By: Georges Mouse, Minus Breeding on 08/07/2020 15:02:27 Evilsizer, Juncal ([id number]) -------------------------------------------------------------------------------- Compression Therapy Details Patient Name: [name], [name] Date  of Service: 08/07/2020 2:00 PM Medical Record Number: [id number] Patient Account Number: [id number] Date of Birth/Sex: 31-Aug-1926 (87 y.o. F) Treating RN: Dolan Amen Primary Care Ellwyn Ergle: [name] Other Clinician: Referring Kyree Adriano: [name] Treating Zakarie Sturdivant/Extender: Skipper Cliche in Treatment: 6 Compression Therapy Performed for Wound Assessment: Wound #2 Left,Medial Calcaneus Performed By: Clinician Dolan Amen, RN Compression Type: Three Layer Pre Treatment ABI: 1.2 Post Procedure Diagnosis Same as Pre-procedure Electronic Signature(s) Signed: 08/07/2020 4:15:25 PM By: Georges Mouse, Minus Breeding RN Entered By: Georges Mouse, Minus Breeding on 08/07/2020 15:00:36 Vreeland, Shemica L. ([id number]) -------------------------------------------------------------------------------- Encounter Discharge Information Details Patient Name: [name], [name] Date of Service: 08/07/2020 2:00 PM Medical Record Number: [id number] Patient Account Number: [id number] Date of Birth/Sex: [birth year] (87 y.o. F) Treating RN: Donnamarie Poag Primary Care Yuriy Cui: [name] Other Clinician: Referring Xylah Early: [name] Treating Daymond Cordts/Extender: Skipper Cliche in Treatment: 6 Encounter Discharge Information Items Post Procedure Vitals Discharge Condition: Stable Temperature (F): 97.8 Ambulatory Status: Wheelchair Pulse (bpm): 98 Discharge Destination: Home Respiratory Rate (breaths/min): 18 Transportation: Private Auto Blood Pressure (mmHg): 111/69 Accompanied By: daughter Schedule Follow-up Appointment: Yes Clinical Summary of Care: Electronic Signature(s) Signed: 08/07/2020 3:40:04 PM By: Donnamarie Poag Entered ByDonnamarie Poag on 08/07/2020 15:22:42 Gerhart, Shaquilla L. ([id number]) -------------------------------------------------------------------------------- Lower Extremity Assessment Details Patient Name: Labrecque, Ciena L. Date of Service: 08/07/2020 2:00 PM Medical Record Number: [id number] Patient Account Number: [id number] Date of Birth/Sex: Jun 29, 1926 (87 y.o. F) Treating RN: Carlene Coria Primary Care Philomene Haff: [name] Other Clinician: Referring Michal Callicott: [name] Treating Cheyenne Schumm/Extender: Skipper Cliche in Treatment: 6 Edema Assessment Assessed: [Left: No] [Right: No] Edema: [Left: Ye] [Right: s] Calf Left: Right: Point of Measurement: 32 cm From Medial Instep 35 cm Ankle Left: Right: Point of Measurement: 12 cm From Medial Instep 27 cm Vascular Assessment Pulses: Dorsalis Pedis Palpable: [Left:Yes] Electronic Signature(s) Signed: 08/08/2020 4:27:57 PM By: Carlene Coria RN Entered By: Carlene Coria on 08/07/2020 14:35:08 Hume, Kerrie L. ([id number]) -------------------------------------------------------------------------------- Multi Wound Chart Details Patient Name: Raimer, Wilbur L. Date of Service: 08/07/2020 2:00 PM Medical Record Number: [id number] Patient Account Number: [id number] Date of Birth/Sex: 06/09/1926 (87 y.o. F) Treating RN: Dolan Amen Primary Care Deona Novitski: [name] Other Clinician: Referring Lissa Rowles: [name] Treating Jaykwon Morones/Extender: Skipper Cliche in Treatment: 6 Vital Signs Height(in): 58 Pulse(bpm): 74 Weight(lbs): 137 Blood Pressure(mmHg): 111/69 Body Mass Index(BMI): 29 Temperature(F): 98.9 Respiratory Rate(breaths/min): 18 Photos: [N/A:N/A] Wound Location: Left, Medial Calcaneus N/A N/A Wounding Event: Pressure Injury N/A N/A Primary Etiology: Pressure Ulcer N/A N/A Comorbid History: Cataracts, Anemia, Coronary Artery N/A N/A Disease, Hypertension, Myocardial Infarction, End Stage Renal Disease, Gout,  Osteoarthritis, Dementia Date Acquired: 04/11/2020 N/A N/A Weeks of Treatment: 6 N/A N/A Wound Status: Open N/A N/A Measurements L x W x D (cm) 1.1x1.5x0.6 N/A N/A Area (cm) : 1.296 N/A N/A Volume (cm) : 0.778 N/A N/A % Reduction in Area: -10.00% N/A N/A % Reduction in Volume: -65.20% N/A N/A Classification: Category/Stage III N/A N/A Exudate Amount: Medium N/A N/A Exudate Type: Serosanguineous N/A N/A Exudate Color: red, brown N/A N/A Granulation Amount: Medium (34-66%) N/A N/A Granulation Quality: Pink, Pale N/A N/A Necrotic Amount: Medium (34-66%) N/A N/A Exposed Structures: Fat Layer (Subcutaneous Tissue): N/A N/A Yes Fascia: No Tendon: No Muscle: No Joint: No Bone: No Epithelialization: None N/A N/A Treatment Notes Electronic Signature(s) Signed: 08/07/2020 4:15:25 PM By: Georges Mouse, Minus Breeding RN Entered By: Georges Mouse, Minus Breeding on 08/07/2020 14:57:06 Pilch, Hadlee L. ([id number]) -------------------------------------------------------------------------------- [place] Details Patient Name: Ellingsen, Victoire L. Date  of Service: 08/07/2020 2:00 PM Medical Record Number: [id number] Patient Account Number: [id number] Date of Birth/Sex: 1927/02/14 (87 y.o. F) Treating RN: Dolan Amen Primary Care Jabarri Stefanelli: [name] Other Clinician: Referring Nakeda Lebron: [name] Treating Maniah Nading/Extender: Skipper Cliche in Treatment: 6 Active Inactive Wound/Skin Impairment Nursing Diagnoses: Impaired tissue integrity Goals: Patient/caregiver will verbalize understanding of skin care regimen Date Initiated: 06/26/2020 Target Resolution Date: 06/26/2020 Goal Status: Active Ulcer/skin breakdown will have a volume reduction of 30% by week 4 Date Initiated: 06/26/2020 Target Resolution Date: 07/27/2020 Goal Status: Active Ulcer/skin breakdown will have a volume reduction of 50% by week 8 Date Initiated: 06/26/2020 Target Resolution Date:  08/26/2020 Goal Status: Active Ulcer/skin breakdown will have a volume reduction of 80% by week 12 Date Initiated: 06/26/2020 Target Resolution Date: 09/26/2020 Goal Status: Active Ulcer/skin breakdown will heal within 14 weeks Date Initiated: 06/26/2020 Target Resolution Date: 10/27/2020 Goal Status: Active Interventions: Assess patient/caregiver ability to obtain necessary supplies Assess patient/caregiver ability to perform ulcer/skin care regimen upon admission and as needed Assess ulceration(s) every visit Provide education on ulcer and skin care Treatment Activities: Referred to DME Maddyn Lieurance for dressing supplies : 06/26/2020 Skin care regimen initiated : 06/26/2020 Notes: Electronic Signature(s) Signed: 08/07/2020 4:15:25 PM By: Georges Mouse, Minus Breeding RN Entered By: Georges Mouse, Minus Breeding on 08/07/2020 14:56:30 Santillo, Tobi L. ([id number]) -------------------------------------------------------------------------------- Pain Assessment Details Patient Name: Dark, Jaci L. Date of Service: 08/07/2020 2:00 PM Medical Record Number: [id number] Patient Account Number: [id number] Date of Birth/Sex: 14-Oct-1926 (87 y.o. F) Treating RN: Dolan Amen Primary Care Gurleen Larrivee: [name] Other Clinician: Referring Jaevion Goto: [name] Treating Marlaya Turck/Extender: Skipper Cliche in Treatment: 6 Active Problems Location of Pain Severity and Description of Pain Patient Has Paino No Site Locations Rate the pain. Current Pain Level: 0 Pain Management and Medication Current Pain Management: Electronic Signature(s) Signed: 08/07/2020 4:15:25 PM By: Georges Mouse, Minus Breeding RN Signed: 08/07/2020 4:23:36 PM By: Jeanine Luz Entered By: Jeanine Luz on 08/07/2020 14:26:06 Llera, Saadiya L. ([id number]) -------------------------------------------------------------------------------- Patient/Caregiver Education Details Patient Name: Gurka, Hopie L. Date of Service:  08/07/2020 2:00 PM Medical Record Number: [id number] Patient Account Number: [id number] Date of Birth/Gender: 06-16-1926 (87 y.o. F) Treating RN: Dolan Amen Primary Care Physician: [name] Other Clinician: Referring Physician: [name] Treating Physician/Extender: Skipper Cliche in Treatment: 6 Education Assessment Education Provided To: Patient Education Topics Provided Wound/Skin Impairment: Methods: Explain/Verbal Responses: State content correctly Electronic Signature(s) Signed: 08/07/2020 4:15:25 PM By: Georges Mouse, Minus Breeding RN Entered By: Georges Mouse, Minus Breeding on 08/07/2020 15:03:53 Baetz, Rutledge ([id number]) -------------------------------------------------------------------------------- Wound Assessment Details Patient Name: Canova, Krupa L. Date of Service: 08/07/2020 2:00 PM Medical Record Number: [id number] Patient Account Number: [id number] Date of Birth/Sex: 03/10/1926 (87 y.o. F) Treating RN: Carlene Coria Primary Care Hadeel Hillebrand: [name] Other Clinician: Referring Evelean Bigler: [name] Treating Bobie Kistler/Extender: Skipper Cliche in Treatment: 6 Wound Status Wound Number: 2 Primary Pressure Ulcer Etiology: Wound Location: Left, Medial Calcaneus Wound Open Wounding Event: Pressure Injury Status: Date Acquired: 04/11/2020 Comorbid Cataracts, Anemia, Coronary Artery Disease, Weeks Of Treatment: 6 History: Hypertension, Myocardial Infarction, End Stage Renal Clustered Wound: No Disease, Gout, Osteoarthritis, Dementia Photos Wound Measurements Length: (cm) 1.1 Width: (cm) 1.5 Depth: (cm) 0.6 Area: (cm) 1.296 Volume: (cm) 0.778 % Reduction in Area: -10% % Reduction in Volume: -65.2% Epithelialization: None Tunneling: No Undermining: No Wound Description Classification: Category/Stage III Exudate Amount: Medium Exudate Type: Serosanguineous Exudate Color: red, brown Foul Odor After Cleansing: No Slough/Fibrino  Yes Wound Bed Granulation Amount: Medium (34-66%) Exposed Structure  Granulation Quality: Pink, Pale Fascia Exposed: No Necrotic Amount: Medium (34-66%) Fat Layer (Subcutaneous Tissue) Exposed: Yes Necrotic Quality: Adherent Slough Tendon Exposed: No Muscle Exposed: No Joint Exposed: No Bone Exposed: No Treatment Notes Wound #2 (Calcaneus) Wound Laterality: Left, Medial Cleanser Soap and Water Discharge Instruction: Gently cleanse wound with antibacterial soap, rinse and pat dry prior to dressing wounds Peri-Wound Care Cotterill, Milanni L. ([id number]) Topical Primary Dressing Iodosorb 40 (g) Discharge Instruction: Apply IodoSorb to wound bed only as directed. Secondary Dressing ABD Pad 5x9 (in/in) Discharge Instruction: Cover with ABD pad. Secured With Compression Wrap Profore Lite LF 3 Multilayer Compression Bandaging System Discharge Instruction: Apply 3 multi-layer wrap as prescribed. Compression Stockings Add-Ons Electronic Signature(s) Signed: 08/08/2020 4:27:57 PM By: Carlene Coria RN Entered By: Carlene Coria on 08/07/2020 14:34:01 Weisgerber, Marquesa L. ([id number]) -------------------------------------------------------------------------------- Vitals Details Patient Name: Anello, Octavia L. Date of Service: 08/07/2020 2:00 PM Medical Record Number: [id number] Patient Account Number: [id number] Date of Birth/Sex: 02/21/1926 (87 y.o. F) Treating RN: Dolan Amen Primary Care Joaquina Nissen: [name] Other Clinician: Referring Rashon Westrup: [name] Treating Deetta Siegmann/Extender: Skipper Cliche in Treatment: 6 Vital Signs Time Taken: 14:20 Temperature (F): 98.9 Height (in): 58 Pulse (bpm): 98 Weight (lbs): 137 Respiratory Rate (breaths/min): 18 Body Mass Index (BMI): 28.6 Blood Pressure (mmHg): 111/69 Reference Range: 80 - 120 mg / dl Electronic Signature(s) Signed: 08/07/2020 4:23:36 PM By: Jeanine Luz Entered By: Jeanine Luz on 08/07/2020 14:25:59

## 2020-08-14 ENCOUNTER — Other Ambulatory Visit: Payer: Self-pay

## 2020-08-14 ENCOUNTER — Encounter: Payer: PPO | Admitting: Physician Assistant

## 2020-08-14 DIAGNOSIS — L89623 Pressure ulcer of left heel, stage 3: Secondary | ICD-10-CM | POA: Diagnosis not present

## 2020-08-14 NOTE — Progress Notes (Addendum)
Steinmiller, Armida L. ([id number]) Visit Report for 08/14/2020 Chief Complaint Document Details Patient Name: [name] [name], [name] Date of Service: 08/14/2020 11:15 AM Medical Record Number: [id number] Patient Account Number: [id number] Date of Birth/Sex: Sep 18, 1926 (87 y.o. F) Treating RN: Dolan Amen Primary Care Provider: Lelon [name] Other Clinician: Referring Provider: Lelon [name] Treating Provider/Extender: Skipper Cliche in Treatment: 7 Information Obtained from: Patient Chief Complaint Left Heel Pressure Ulcer Electronic Signature(s) Signed: 08/14/2020 11:18:49 AM By: Worthy Keeler PA-C Entered By: Worthy Keeler on 08/14/2020 11:18:49 [name] [name], [name] ([id number]) -------------------------------------------------------------------------------- Debridement Details Patient Name: [name], [name] Date of Service: 08/14/2020 11:15 AM Medical Record Number: [id number] Patient Account Number: [id number] Date of Birth/Sex: 1926/04/08 (87 y.o. F) Treating RN: Dolan Amen Primary Care Provider: Lelon [name] Other Clinician: Referring Provider: Lelon [name] Treating Provider/Extender: Skipper Cliche in Treatment: 7 Debridement Performed for Wound #2 Left,Medial Calcaneus Assessment: Performed By: Physician Tommie Sams., PA-C Debridement Type: Debridement Level of Consciousness (Pre- Awake and Alert procedure): Pre-procedure Verification/Time Out Yes - 11:51 Taken: Start Time: 11:51 Total Area Debrided (L x W): 1 (cm) x 1.3 (cm) = 1.3 (cm) Tissue and other material Viable, Non-Viable, Callus, Slough, Subcutaneous, Slough debrided: Level: Skin/Subcutaneous Tissue Debridement Description: Excisional Instrument: Curette Bleeding: Minimum Hemostasis Achieved: Pressure Response to Treatment: Procedure was tolerated well Level of Consciousness (Post- Awake and Alert procedure): Post Debridement Measurements of Total Wound Length: (cm) 1 Stage:  Category/Stage III Width: (cm) 1.3 Depth: (cm) 0.7 Volume: (cm) 0.715 Character of Wound/Ulcer Post Debridement: Stable Post Procedure Diagnosis Same as Pre-procedure Electronic Signature(s) Signed: 08/14/2020 4:55:48 PM By: Charlett Nose RN Signed: 08/14/2020 4:56:00 PM By: Worthy Keeler PA-C Entered By: Georges Mouse, Minus Breeding on 08/14/2020 11:53:23 [name] [name], [name] ([id number]) -------------------------------------------------------------------------------- HPI Details Patient Name: [name], [name] Date of Service: 08/14/2020 11:15 AM Medical Record Number: [id number] Patient Account Number: [id number] Date of Birth/Sex: April 09, 1926 (87 y.o. F) Treating RN: Dolan Amen Primary Care Provider: Lelon [name] Other Clinician: Referring Provider: Lelon [name] Treating Provider/Extender: Skipper Cliche in Treatment: 7 History of Present Illness HPI Description: 87 year old patient was recently seen by the PCPs office for significant pain right great toe which has been going on since July. She was initially treated with Keflex which she did not complete and after the office visit this time she has been put on doxycycline. at the time of her visit she was found to have a ulcer on the plantar surface of the right great toe and also had a pyogenic granuloma over this area. X-ray of the right foot done 12/29/2014 -- IMPRESSION:Soft-tissue swelling and ulceration right great toe. No underlying bony lytic lesion identified. If osteomyelitis remains of clinical concern MRI can be obtained. Past medical history significant for anemia, chronic kidney disease stage III, obesity, varicose veins, coronary artery disease, gout, history of nicotine addiction given up smoking in 2002, hypertension, status post cardiac catheterization, status post abdominal hysterectomy, cholecystectomy and tonsillectomy. hemoglobin A1c done in August was 5.8 01/22/2015 -- at this stage the [facility] Walking  boat was going to cost them significant amount of money and they want to defer using that at the present time. 01/29/2015 -- she had a podiatry appointment and they have trimmed her toenails. She has not heard back from the vascular office regarding her venous duplex study and I have asked them to call personally so that they can get the appointment soon. 02/06/2015 -- they have made contact with the vascular office and from what I  understand that test has been done but the report is pending. 02/19/2014 -- the vascular test is scheduled for tomorrow Readmission: 06/26/2020 upon evaluation today patient appears for initial evaluation in her clinic that she has been here before in 2016 and has been quite sometime. She did have a fractured hip in February on the 23rd 2022. Subsequently this had to be pinned and she ended up with a pressure injury on her heel following when she was using her foot to help move her around in the bed. Subsequently this has led to the wound that she has been dealing with since that time. She lives at home with her daughter currently she does have dementia. 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 07/03/2020 upon evaluation today patient appears to be doing well with regard to her heel ulcer. She did have arterial studies they appear to be doing excellent it was premature normal across the board with TBI's in the 90s and ABIs well within normal range. Nonetheless there does not appear to be any signs of arterial insufficiency whatsoever. With that being said the patient does have also signs of improvement there is some necrotic tissue in the base of the wound number to try to clear some of that away today I do believe the Iodoflex/Iodosorb is doing well. 5/24; difficult punched out wound on the left medial heel. She has been using  Iodoflex 07/17/2020 upon evaluation today patient appears to be doing well at this point in regard to her wound. I do feel like this is a little bit deeper but again that is what is expected as we continue with the Iodoflex I think that this is just going to get deeper until we get to the base of the wound. With that being said I think we are getting much closer to the base of the wound where we can have healthier tissue that we get a be managing here which that will be awesome. In the meantime I am not surprised by what I am seeing and in fact the wound appears to be better as compared to previous findings. 07/24/2020 upon evaluation today patient appears to be doing well with regard to her wound. Overall I am extremely pleased with where things stand today. I do not see any signs of active infection which is great and overall I think that the patient is making good progress. There is good to be some need for sharp debridement today. 07/31/2020 upon evaluation today patient appears to be doing better in regard to her heel ulcer. She has been tolerating the dressing changes without complication. Fortunately there does not appear to be any signs of active infection which is great and overall very pleased in this regard. No fevers, chills, nausea, vomiting, or diarrhea. 08/14/2020 upon evaluation today patient appears to be doing better in regard to her heel ulcer. I am very pleased in that regard. Unfortunately she still has a slight deep tissue injury in regard to the medial portion of her foot over the same area. Unfortunately I think this is something that if were not careful it was can open up into the wound. That is my main concern here based on what I see. Fortunately there does not appear to be any evidence of active infection which is great news and overall very pleased with where things stand at this point. Electronic Signature(s) Signed: 08/14/2020 1:30:15  PM By: Worthy Keeler PA-C Entered By:  Worthy Keeler on 08/14/2020 13:30:14 [name] [name], [name] ([id number]) -------------------------------------------------------------------------------- Physical Exam Details Patient Name: [name] [name], [name] Date of Service: 08/14/2020 11:15 AM Medical Record Number: [id number] Patient Account Number: [id number] Date of Birth/Sex: [birth year] (87 y.o. F) Treating RN: Dolan Amen Primary Care Provider: Lelon [name] Other Clinician: Referring Provider: Lelon [name] Treating Provider/Extender: Skipper Cliche in Treatment: 7 Constitutional Well-nourished and well-hydrated in no acute distress. Respiratory normal breathing without difficulty. Psychiatric this patient is able to make decisions and demonstrates good insight into disease process. Alert and Oriented x 3. pleasant and cooperative. Notes Patient's wound bed did require sharp debridement to clear away some of the necrotic debris she tolerated that today without complication postdebridement wound bed appears to be doing much better which is great news. Electronic Signature(s) Signed: 08/14/2020 1:30:40 PM By: Worthy Keeler PA-C Entered By: Worthy Keeler on 08/14/2020 13:30:40 [name] [name], [name] ([id number]) -------------------------------------------------------------------------------- Physician Orders Details Patient Name: Michelle, Gaylynn L. Date of Service: 08/14/2020 11:15 AM Medical Record Number: [id number] Patient Account Number: [id number] Date of Birth/Sex: Aug 31, 1926 (87 y.o. F) Treating RN: Dolan Amen Primary Care Provider: Lelon [name] Other Clinician: Referring Provider: Lelon [name] Treating Provider/Extender: Skipper Cliche in Treatment: 7 Verbal / Phone Orders: No Diagnosis Coding ICD-10 Coding Code Description [phone] Pressure ulcer of left heel, stage 3 I10 Essential (primary) hypertension F01.50 Vascular dementia without behavioral disturbance I25.10 Atherosclerotic heart disease of  native coronary artery without angina pectoris Follow-up Appointments o Return Appointment in 1 week. o Nurse Visit as needed Bathing/ Shower/ Hygiene o May shower; gently cleanse wound with antibacterial soap, rinse and pat dry prior to dressing wounds Non-Wound Condition o Additional non-wound orders/instructions: - Pad dorsal foot with remaining HYDROFERA on top for protection Off-Loading o Other: - Offloading boots while in bed; Try pillow under sheet to keep it in place. Wound Treatment Wound #2 - Calcaneus Wound Laterality: Left, Medial Cleanser: Soap and Water 1 x Per Week/30 Days Discharge Instructions: Gently cleanse wound with antibacterial soap, rinse and pat dry prior to dressing wounds Primary Dressing: Hydrofera Blue Ready Transfer Foam, 2.5x2.5 (in/in) 1 x Per Week/30 Days Discharge Instructions: Apply Hydrofera Blue Ready to wound bed as directed Secondary Dressing: ABD Pad 5x9 (in/in) 1 x Per Week/30 Days Discharge Instructions: Cover with ABD pad. Compression Wrap: Profore Lite LF 3 Multilayer Compression Bandaging System 1 x Per Week/30 Days Discharge Instructions: Apply 3 multi-layer wrap as prescribed. Electronic Signature(s) Signed: 08/14/2020 4:55:48 PM By: Georges Mouse, Minus Breeding RN Signed: 08/14/2020 4:56:00 PM By: Worthy Keeler PA-C Entered By: Georges Mouse, Minus Breeding on 08/14/2020 11:56:55 [name] [name], [name] ([id number]) -------------------------------------------------------------------------------- Problem List Details Patient Name: Casler, Rosell L. Date of Service: 08/14/2020 11:15 AM Medical Record Number: [id number] Patient Account Number: [id number] Date of Birth/Sex: October 04, 1926 (87 y.o. F) Treating RN: Dolan Amen Primary Care Provider: Lelon [name] Other Clinician: Referring Provider: Lelon [name] Treating Provider/Extender: Skipper Cliche in Treatment: 7 Active Problems ICD-10 Encounter Code Description Active Date  MDM Diagnosis [phone] Pressure ulcer of left heel, stage 3 06/26/2020 No Yes I10 Essential (primary) hypertension 06/26/2020 No Yes F01.50 Vascular dementia without behavioral disturbance 06/26/2020 No Yes I25.10 Atherosclerotic heart disease of native coronary artery without angina 06/26/2020 No Yes pectoris Inactive Problems Resolved Problems Electronic Signature(s) Signed: 08/14/2020 11:18:40 AM By: Worthy Keeler PA-C Entered By: Worthy Keeler on 08/14/2020 11:18:40 [name] [name], [name] ([id number]) -------------------------------------------------------------------------------- Progress Note Details Patient Name: ASHLYNE, RIDPATH  L. Date of Service: 08/14/2020 11:15 AM Medical Record Number: [id number] Patient Account Number: [id number] Date of Birth/Sex: 10-31-1926 (87 y.o. F) Treating RN: Dolan Amen Primary Care Provider: Lelon [name] Other Clinician: Referring Provider: Lelon [name] Treating Provider/Extender: Skipper Cliche in Treatment: 7 Subjective Chief Complaint Information obtained from Patient Left Heel Pressure Ulcer History of Present Illness (HPI) 87 year old patient was recently seen by the PCPs office for significant pain right great toe which has been going on since July. She was initially treated with Keflex which she did not complete and after the office visit this time she has been put on doxycycline. at the time of her visit she was found to have a ulcer on the plantar surface of the right great toe and also had a pyogenic granuloma over this area. X-ray of the right foot done 12/29/2014 -- IMPRESSION:Soft-tissue swelling and ulceration right great toe. No underlying bony lytic lesion identified. If osteomyelitis remains of clinical concern MRI can be obtained. Past medical history significant for anemia, chronic kidney disease stage III, obesity, varicose veins, coronary artery disease, gout, history of nicotine addiction given up smoking in 2002,  hypertension, status post cardiac catheterization, status post abdominal hysterectomy, cholecystectomy and tonsillectomy. hemoglobin A1c done in August was 5.8 01/22/2015 -- at this stage the [facility] Walking boat was going to cost them significant amount of money and they want to defer using that at the present time. 01/29/2015 -- she had a podiatry appointment and they have trimmed her toenails. She has not heard back from the vascular office regarding her venous duplex study and I have asked them to call personally so that they can get the appointment soon. 02/06/2015 -- they have made contact with the vascular office and from what I understand that test has been done but the report is pending. 02/19/2014 -- the vascular test is scheduled for tomorrow Readmission: 06/26/2020 upon evaluation today patient appears for initial evaluation in her clinic that she has been here before in 2016 and has been quite sometime. She did have a fractured hip in February on the 23rd 2022. Subsequently this had to be pinned and she ended up with a pressure injury on her heel following when she was using her foot to help move her around in the bed. Subsequently this has led to the wound that she has been dealing with since that time. She lives at home with her daughter currently she does have dementia. 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 07/03/2020 upon evaluation today patient appears to be doing well with regard to her heel ulcer. She did have arterial studies they appear to be doing excellent it was premature normal across the board with TBI's in the 90s and ABIs well within normal range. Nonetheless there does not appear to be any signs of arterial insufficiency whatsoever. With that being said the patient does have also signs of improvement there is some necrotic tissue in the  base of the wound number to try to clear some of that away today I do believe the Iodoflex/Iodosorb is doing well. 5/24; difficult punched out wound on the left medial heel. She has been using Iodoflex 07/17/2020 upon evaluation today patient appears to be doing well at this point in regard to her wound. I do feel like this is a little bit deeper but again that is what is expected as we continue with  the Iodoflex I think that this is just going to get deeper until we get to the base of the wound. With that being said I think we are getting much closer to the base of the wound where we can have healthier tissue that we get a be managing here which that will be awesome. In the meantime I am not surprised by what I am seeing and in fact the wound appears to be better as compared to previous findings. 07/24/2020 upon evaluation today patient appears to be doing well with regard to her wound. Overall I am extremely pleased with where things stand today. I do not see any signs of active infection which is great and overall I think that the patient is making good progress. There is good to be some need for sharp debridement today. 07/31/2020 upon evaluation today patient appears to be doing better in regard to her heel ulcer. She has been tolerating the dressing changes without complication. Fortunately there does not appear to be any signs of active infection which is great and overall very pleased in this regard. No fevers, chills, nausea, vomiting, or diarrhea. 08/14/2020 upon evaluation today patient appears to be doing better in regard to her heel ulcer. I am very pleased in that regard. Unfortunately she still has a slight deep tissue injury in regard to the medial portion of her foot over the same area. Unfortunately I think this is something that if were not careful it was can open up into the wound. That is my main concern here based on what I see. Fortunately there does not appear to be any evidence of  active infection which is great news and overall very pleased with where things stand at this point. [name] [name], [name] ([id number]) Objective Constitutional Well-nourished and well-hydrated in no acute distress. Vitals Time Taken: 11:22 AM, Height: 58 in, Weight: 137 lbs, BMI: 28.6, Temperature: 97.7 F, Pulse: 79 bpm, Respiratory Rate: 18 breaths/min, Blood Pressure: 116/61 mmHg. Respiratory normal breathing without difficulty. Psychiatric this patient is able to make decisions and demonstrates good insight into disease process. Alert and Oriented x 3. pleasant and cooperative. General Notes: Patient's wound bed did require sharp debridement to clear away some of the necrotic debris she tolerated that today without complication postdebridement wound bed appears to be doing much better which is great news. Integumentary (Hair, Skin) Wound #2 status is Open. Original cause of wound was Pressure Injury. The date acquired was: 04/11/2020. The wound has been in treatment 7 weeks. The wound is located on the Left,Medial Calcaneus. The wound measures 1cm length x 1.3cm width x 0.6cm depth; 1.021cm^2 area and 0.613cm^3 volume. There is Fat Layer (Subcutaneous Tissue) exposed. There is no tunneling or undermining noted. There is a medium amount of serosanguineous drainage noted. There is medium (34-66%) pink, pale granulation within the wound bed. There is a medium (34-66%) amount of necrotic tissue within the wound bed including Adherent Slough. Assessment Active Problems ICD-10 Pressure ulcer of left heel, stage 3 Essential (primary) hypertension Vascular dementia without behavioral disturbance Atherosclerotic heart disease of native coronary artery without angina pectoris Procedures Wound #2 Pre-procedure diagnosis of Wound #2 is a Pressure Ulcer located on the Left,Medial Calcaneus . There was a Excisional Skin/Subcutaneous Tissue Debridement with a total area of 1.3 sq cm performed by  Tommie Sams., PA-C. With the following instrument(s): Curette to remove Viable and Non-Viable tissue/material. Material removed includes Callus, Subcutaneous Tissue, and Slough. A time out was conducted at 11:51, prior  to the start of the procedure. A Minimum amount of bleeding was controlled with Pressure. The procedure was tolerated well. Post Debridement Measurements: 1cm length x 1.3cm width x 0.7cm depth; 0.715cm^3 volume. Post debridement Stage noted as Category/Stage III. Character of Wound/Ulcer Post Debridement is stable. Post procedure Diagnosis Wound #2: Same as Pre-Procedure Pre-procedure diagnosis of Wound #2 is a Pressure Ulcer located on the Left,Medial Calcaneus . There was a Three Layer Compression Therapy Procedure with a pre-treatment ABI of 1.2 by Dolan Amen, RN. Post procedure Diagnosis Wound #2: Same as Pre-Procedure Plan Follow-up Appointments: Return Appointment in 1 week. Nurse Visit as needed Bathing/ Shower/ Hygiene: May shower; gently cleanse wound with antibacterial soap, rinse and pat dry prior to dressing wounds Non-Wound Condition: Additional non-wound orders/instructions: - Pad dorsal foot with remaining HYDROFERA on top for protection Dickman, Jisele L. ([id number]) Off-Loading: Other: - Offloading boots while in bed; Try pillow under sheet to keep it in place. WOUND #2: - Calcaneus Wound Laterality: Left, Medial Cleanser: Soap and Water 1 x Per Week/30 Days Discharge Instructions: Gently cleanse wound with antibacterial soap, rinse and pat dry prior to dressing wounds Primary Dressing: Hydrofera Blue Ready Transfer Foam, 2.5x2.5 (in/in) 1 x Per Week/30 Days Discharge Instructions: Apply Hydrofera Blue Ready to wound bed as directed Secondary Dressing: ABD Pad 5x9 (in/in) 1 x Per Week/30 Days Discharge Instructions: Cover with ABD pad. Compression Wrap: Profore Lite LF 3 Multilayer Compression Bandaging System 1 x Per Week/30 Days Discharge  Instructions: Apply 3 multi-layer wrap as prescribed. 1. Would recommend that we go ahead and continue with the compression wrap I think this is doing a good job. However I Georgina Peer switch out the dressing we can actually change over to [facility]. 2. I am also can recommend that we have the patient go ahead and continue with the ABD pads to cover followed by 3 layer compression wrap. 3. I am also can recommend she continue with appropriate offloading organ to use a cushion boot on the opposite foot as well and that was discussed with the patient's daughter currently. She continues with the cushion boot on the affected foot. Subsequently we will also see how things are doing in a week's time with regard to the pressure in these locations. We will see patient back for reevaluation in 1 week here in the clinic. If anything worsens or changes patient will contact our office for additional recommendations. Electronic Signature(s) Signed: 08/14/2020 1:31:27 PM By: Worthy Keeler PA-C Entered By: Worthy Keeler on 08/14/2020 13:31:27 Spisak, Neira L. ([id number]) -------------------------------------------------------------------------------- SuperBill Details Patient Name: Illes, Fariha L. Date of Service: 08/14/2020 Medical Record Number: [id number] Patient Account Number: [id number] Date of Birth/Sex: 11/04/1926 (87 y.o. F) Treating RN: Dolan Amen Primary Care Provider: Lelon [name] Other Clinician: Referring Provider: Lelon [name] Treating Provider/Extender: Skipper Cliche in Treatment: 7 Diagnosis Coding ICD-10 Codes Code Description [phone] Pressure ulcer of left heel, stage 3 I10 Essential (primary) hypertension F01.50 Vascular dementia without behavioral disturbance I25.10 Atherosclerotic heart disease of native coronary artery without angina pectoris Facility Procedures CPT4 Code: [id number] Description: 11042 - DEB SUBQ TISSUE 20 SQ CM/< Modifier: Quantity: 1 CPT4  Code: Description: ICD-10 Diagnosis Description L89.623 Pressure ulcer of left heel, stage 3 Modifier: Quantity: Physician Procedures CPT4 Code: [id number] Description: 11042 - WC PHYS SUBQ TISS 20 SQ CM Modifier: Quantity: 1 CPT4 Code: Description: ICD-10 Diagnosis Description L89.623 Pressure ulcer of left heel, stage 3 Modifier: Quantity: Electronic Signature(s) Signed: 08/14/2020 1:31:37 PM By: Joaquim Lai  III, Mabel Roll PA-C Entered By: Worthy Keeler on 08/14/2020 13:31:37

## 2020-08-21 ENCOUNTER — Encounter: Payer: PPO | Attending: Physician Assistant | Admitting: Physician Assistant

## 2020-08-21 ENCOUNTER — Other Ambulatory Visit: Payer: Self-pay

## 2020-08-21 DIAGNOSIS — I129 Hypertensive chronic kidney disease with stage 1 through stage 4 chronic kidney disease, or unspecified chronic kidney disease: Secondary | ICD-10-CM | POA: Diagnosis not present

## 2020-08-21 DIAGNOSIS — E669 Obesity, unspecified: Secondary | ICD-10-CM | POA: Insufficient documentation

## 2020-08-21 DIAGNOSIS — N183 Chronic kidney disease, stage 3 unspecified: Secondary | ICD-10-CM | POA: Insufficient documentation

## 2020-08-21 DIAGNOSIS — I2510 Atherosclerotic heart disease of native coronary artery without angina pectoris: Secondary | ICD-10-CM | POA: Diagnosis not present

## 2020-08-21 DIAGNOSIS — Z87891 Personal history of nicotine dependence: Secondary | ICD-10-CM | POA: Diagnosis not present

## 2020-08-21 DIAGNOSIS — L89623 Pressure ulcer of left heel, stage 3: Secondary | ICD-10-CM | POA: Insufficient documentation

## 2020-08-21 DIAGNOSIS — M109 Gout, unspecified: Secondary | ICD-10-CM | POA: Insufficient documentation

## 2020-08-21 DIAGNOSIS — D649 Anemia, unspecified: Secondary | ICD-10-CM | POA: Diagnosis not present

## 2020-08-21 DIAGNOSIS — Z6828 Body mass index (BMI) 28.0-28.9, adult: Secondary | ICD-10-CM | POA: Insufficient documentation

## 2020-08-21 DIAGNOSIS — L89629 Pressure ulcer of left heel, unspecified stage: Secondary | ICD-10-CM | POA: Diagnosis present

## 2020-08-21 DIAGNOSIS — F0150 Vascular dementia without behavioral disturbance: Secondary | ICD-10-CM | POA: Diagnosis not present

## 2020-08-21 NOTE — Progress Notes (Addendum)
Norland, Kairy L. ([id number]) Visit Report for 08/21/2020 Arrival Information Details Patient Name: [name] [name], [name] Date of Service: 08/21/2020 11:15 AM Medical Record Number: [id number] Patient Account Number: [id number] Date of Birth/Sex: Aug 09, 1926 (87 y.o. F) Treating RN: Donnamarie Poag Primary Care Roxane Puerto: [name] Other Clinician: Referring Jule Whitsel: [name] Treating Paddy Neis/Extender: Skipper Cliche in Treatment: 8 Visit Information History Since Last Visit Added or deleted any medications: No Patient Arrived: Wheel Chair Had a fall or experienced change in No Arrival Time: 11:25 activities of daily living that may affect Accompanied By: daughter risk of falls: Transfer Assistance: EasyPivot Patient Lift Hospitalized since last visit: No Patient Identification Verified: Yes Has Dressing in Place as Prescribed: Yes Secondary Verification Process Completed: Yes Has Compression in Place as Prescribed: Yes Patient Requires Transmission-Based No Pain Present Now: No Precautions: Patient Has Alerts: Yes Patient Alerts: NOT diabetic ABI R1.18 L1.25 06/27/20 Electronic Signature(s) Signed: 08/21/2020 3:50:23 PM By: Donnamarie Poag Entered ByDonnamarie Poag on 08/21/2020 11:27:54 Fergeson, Laprecious L. ([id number]) -------------------------------------------------------------------------------- Clinic Level of Care Assessment Details Patient Name: [name] [name], [name] Date of Service: 08/21/2020 11:15 AM Medical Record Number: [id number] Patient Account Number: [id number] Date of Birth/Sex: 12/13/1926 (87 y.o. F) Treating RN: Donnamarie Poag Primary Care Avyukth Bontempo: [name] Other Clinician: Referring Camerin Jimenez: [name] Treating Lucee Brissett/Extender: Skipper Cliche in Treatment: 8 Clinic Level of Care Assessment Items TOOL 1 Quantity Score []  - Use when EandM and Procedure is performed on INITIAL visit 0 ASSESSMENTS - Nursing Assessment / Reassessment []  - General  Physical Exam (combine w/ comprehensive assessment (listed just below) when performed on new 0 pt. evals) []  - 0 Comprehensive Assessment (HX, ROS, Risk Assessments, Wounds Hx, etc.) ASSESSMENTS - Wound and Skin Assessment / Reassessment []  - Dermatologic / Skin Assessment (not related to wound area) 0 ASSESSMENTS - Ostomy and/or Continence Assessment and Care []  - Incontinence Assessment and Management 0 []  - 0 Ostomy Care Assessment and Management (repouching, etc.) PROCESS - Coordination of Care []  - Simple Patient / Family Education for ongoing care 0 []  - 0 Complex (extensive) Patient / Family Education for ongoing care []  - 0 Staff obtains Programmer, systems, Records, Test Results / Process Orders []  - 0 Staff telephones HHA, Nursing Homes / Clarify orders / etc []  - 0 Routine Transfer to another Facility (non-emergent condition) []  - 0 Routine Hospital Admission (non-emergent condition) []  - 0 New Admissions / Biomedical engineer / Ordering NPWT, Apligraf, etc. []  - 0 Emergency Hospital Admission (emergent condition) PROCESS - Special Needs []  - Pediatric / Minor Patient Management 0 []  - 0 Isolation Patient Management []  - 0 Hearing / Language / Visual special needs []  - 0 Assessment of Community assistance (transportation, D/C planning, etc.) []  - 0 Additional assistance / Altered mentation []  - 0 Support Surface(s) Assessment (bed, cushion, seat, etc.) INTERVENTIONS - Miscellaneous []  - External ear exam 0 []  - 0 Patient Transfer (multiple staff / Civil Service fast streamer / Similar devices) []  - 0 Simple Staple / Suture removal (25 or less) []  - 0 Complex Staple / Suture removal (26 or more) []  - 0 Hypo/Hyperglycemic Management (do not check if billed separately) []  - 0 Ankle / Brachial Index (ABI) - do not check if billed separately Has the patient been seen at the hospital within the last three years: Yes Total Score: 0 Level Of Care: ____ Muhlenkamp, De L.  ([id number]) Electronic Signature(s) Signed: 08/21/2020 3:50:23 PM By: Donnamarie Poag Entered By: Donnamarie Poag on 08/21/2020 11:48:16 Vanallen, Farnam (  [id number]) -------------------------------------------------------------------------------- Compression Therapy Details Patient Name: [name] [name], [name] Date of Service: 08/21/2020 11:15 AM Medical Record Number: [id number] Patient Account Number: [id number] Date of Birth/Sex: Mar 12, 1926 (87 y.o. F) Treating RN: Donnamarie Poag Primary Care Bracen Schum: [name] Other Clinician: Referring Nabila Albarracin: [name] Treating Nakshatra Klose/Extender: Skipper Cliche in Treatment: 8 Compression Therapy Performed for Wound Assessment: Wound #2 Left,Medial Calcaneus Performed By: Clinician Donnamarie Poag, RN Compression Type: Three Layer Post Procedure Diagnosis Same as Pre-procedure Electronic Signature(s) Signed: 08/21/2020 3:50:23 PM By: Donnamarie Poag Entered By: Donnamarie Poag on 08/21/2020 11:44:22 Oesterling, Jesse L. ([id number]) -------------------------------------------------------------------------------- Encounter Discharge Information Details Patient Name: [name] [name], [name] Date of Service: 08/21/2020 11:15 AM Medical Record Number: [id number] Patient Account Number: [id number] Date of Birth/Sex: Mar 11, 1926 (87 y.o. F) Treating RN: Donnamarie Poag Primary Care Cybill Uriegas: [name] Other Clinician: Referring Zafiro Routson: [name] Treating Cheril Slattery/Extender: Skipper Cliche in Treatment: 8 Encounter Discharge Information Items Post Procedure Vitals Discharge Condition: Stable Temperature (F): 98 Ambulatory Status: Wheelchair Pulse (bpm): 86 Discharge Destination: Home Respiratory Rate (breaths/min): 18 Transportation: Private Auto Blood Pressure (mmHg): 124/69 Accompanied By: daughter Schedule Follow-up Appointment: No Clinical Summary of Care: Electronic Signature(s) Signed: 08/21/2020 3:50:23 PM By: Donnamarie Poag Entered ByDonnamarie Poag on  08/21/2020 11:58:53 Podesta, Artia L. ([id number]) -------------------------------------------------------------------------------- Lower Extremity Assessment Details Patient Name: [name] [name], [name] Date of Service: 08/21/2020 11:15 AM Medical Record Number: [id number] Patient Account Number: [id number] Date of Birth/Sex: 1926/04/16 (87 y.o. F) Treating RN: Donnamarie Poag Primary Care Florean Hoobler: [name] Other Clinician: Referring Sadik Piascik: [name] Treating Collette Pescador/Extender: Skipper Cliche in Treatment: 8 Edema Assessment Assessed: [Left: Yes] [Right: No] [Left: Edema] [Right: :] Calf Left: Right: Point of Measurement: 32 cm From Medial Instep 32 cm Ankle Left: Right: Point of Measurement: 12 cm From Medial Instep 22.5 cm Vascular Assessment Pulses: Dorsalis Pedis Palpable: [Left:Yes] Electronic Signature(s) Signed: 08/21/2020 3:50:23 PM By: Donnamarie Poag Entered ByDonnamarie Poag on 08/21/2020 11:37:10 Wageman, Haileyann L. ([id number]) -------------------------------------------------------------------------------- Multi Wound Chart Details Patient Name: [name] [name], [name] Date of Service: 08/21/2020 11:15 AM Medical Record Number: [id number] Patient Account Number: [id number] Date of Birth/Sex: 08/24/1926 (87 y.o. F) Treating RN: Donnamarie Poag Primary Care Tristen Luce: [name] Other Clinician: Referring Fenna Semel: [name] Treating Alira Fretwell/Extender: Skipper Cliche in Treatment: 8 Vital Signs Height(in): 58 Pulse(bpm): 86 Weight(lbs): 137 Blood Pressure(mmHg): 124/69 Body Mass Index(BMI): 29 Temperature(F): 98 Respiratory Rate(breaths/min): 18 Photos: [N/A:N/A] Wound Location: Left, Medial Calcaneus N/A N/A Wounding Event: Pressure Injury N/A N/A Primary Etiology: Pressure Ulcer N/A N/A Comorbid History: Cataracts, Anemia, Coronary Artery N/A N/A Disease, Hypertension, Myocardial Infarction, End Stage Renal Disease, Gout,  Osteoarthritis, Dementia Date Acquired: 04/11/2020 N/A N/A Weeks of Treatment: 8 N/A N/A Wound Status: Open N/A N/A Measurements L x W x D (cm) 1x0.5x0.5 N/A N/A Area (cm) : 0.393 N/A N/A Volume (cm) : 0.196 N/A N/A % Reduction in Area: 66.60% N/A N/A % Reduction in Volume: 58.40% N/A N/A Classification: Category/Stage III N/A N/A Exudate Amount: Medium N/A N/A Exudate Type: Serosanguineous N/A N/A Exudate Color: red, brown N/A N/A Granulation Amount: Medium (34-66%) N/A N/A Granulation Quality: Pink, Pale N/A N/A Necrotic Amount: Medium (34-66%) N/A N/A Exposed Structures: Fat Layer (Subcutaneous Tissue): N/A N/A Yes Fascia: No Tendon: No Muscle: No Joint: No Bone: No Epithelialization: None N/A N/A Treatment Notes Electronic Signature(s) Signed: 08/21/2020 3:50:23 PM By: Donnamarie Poag Entered ByDonnamarie Poag on 08/21/2020 11:38:15 [name], Aleesa L. ([id number]) -------------------------------------------------------------------------------- [place] Details Patient Name: [name] [name], [name] Date of  Service: 08/21/2020 11:15 AM Medical Record Number: [id number] Patient Account Number: [id number] Date of Birth/Sex: [birth year] (87 y.o. F) Treating RN: Donnamarie Poag Primary Care Dametra Whetsel: [name] Other Clinician: Referring Moosa Bueche: [name] Treating Naquita Nappier/Extender: Skipper Cliche in Treatment: 8 Active Inactive Wound/Skin Impairment Nursing Diagnoses: Impaired tissue integrity Goals: Patient/caregiver will verbalize understanding of skin care regimen Date Initiated: 06/26/2020 Date Inactivated: 08/21/2020 Target Resolution Date: 06/26/2020 Goal Status: Met Ulcer/skin breakdown will have a volume reduction of 30% by week 4 Date Initiated: 06/26/2020 Date Inactivated: 08/21/2020 Target Resolution Date: 07/27/2020 Goal Status: Met Ulcer/skin breakdown will have a volume reduction of 50% by week 8 Date Initiated: 06/26/2020 Target Resolution  Date: 08/26/2020 Goal Status: Active Ulcer/skin breakdown will have a volume reduction of 80% by week 12 Date Initiated: 06/26/2020 Target Resolution Date: 09/26/2020 Goal Status: Active Ulcer/skin breakdown will heal within 14 weeks Date Initiated: 06/26/2020 Target Resolution Date: 10/27/2020 Goal Status: Active Interventions: Assess patient/caregiver ability to obtain necessary supplies Assess patient/caregiver ability to perform ulcer/skin care regimen upon admission and as needed Assess ulceration(s) every visit Provide education on ulcer and skin care Treatment Activities: Referred to DME Argenis Kumari for dressing supplies : 06/26/2020 Skin care regimen initiated : 06/26/2020 Notes: Electronic Signature(s) Signed: 08/21/2020 3:50:23 PM By: Donnamarie Poag Entered ByDonnamarie Poag on 08/21/2020 11:38:03 [name] [name], [name] ([id number]) -------------------------------------------------------------------------------- Pain Assessment Details Patient Name: Deviney, Tabbetha L. Date of Service: 08/21/2020 11:15 AM Medical Record Number: [id number] Patient Account Number: [id number] Date of Birth/Sex: 1926/09/05 (87 y.o. F) Treating RN: Donnamarie Poag Primary Care Kam Rahimi: [name] Other Clinician: Referring Bluford Sedler: [name] Treating Clete Kuch/Extender: Skipper Cliche in Treatment: 8 Active Problems Location of Pain Severity and Description of Pain Patient Has Paino No Site Locations Rate the pain. Current Pain Level: 0 Pain Management and Medication Current Pain Management: Electronic Signature(s) Signed: 08/21/2020 3:50:23 PM By: Donnamarie Poag Entered By: Donnamarie Poag on 08/21/2020 11:31:33 [name] [name], [name] ([id number]) -------------------------------------------------------------------------------- Patient/Caregiver Education Details Patient Name: [name] [name], [name] Date of Service: 08/21/2020 11:15 AM Medical Record Number: [id number] Patient Account Number: [id number] Date of  Birth/Gender: 02/03/1927 (87 y.o. F) Treating RN: Donnamarie Poag Primary Care Physician: [name] Other Clinician: Referring Physician: [name] Treating Physician/Extender: Skipper Cliche in Treatment: 8 Education Assessment Education Provided To: Patient and Caregiver daughter Education Topics Provided Basic Hygiene: Wound/Skin Impairment: Electronic Signature(s) Signed: 08/21/2020 3:50:23 PM By: Donnamarie Poag Entered ByDonnamarie Poag on 08/21/2020 11:38:50 [name] [name], [name] ([id number]) -------------------------------------------------------------------------------- Wound Assessment Details Patient Name: [name] [name], [name] Date of Service: 08/21/2020 11:15 AM Medical Record Number: [id number] Patient Account Number: [id number] Date of Birth/Sex: November 26, 1926 (87 y.o. F) Treating RN: Donnamarie Poag Primary Care Gustava Berland: [name] Other Clinician: Referring Karandeep Resende: [name] Treating Yohan Samons/Extender: Skipper Cliche in Treatment: 8 Wound Status Wound Number: 2 Primary Pressure Ulcer Etiology: Wound Location: Left, Medial Calcaneus Wound Open Wounding Event: Pressure Injury Status: Date Acquired: 04/11/2020 Comorbid Cataracts, Anemia, Coronary Artery Disease, Weeks Of Treatment: 8 History: Hypertension, Myocardial Infarction, End Stage Renal Clustered Wound: No Disease, Gout, Osteoarthritis, Dementia Photos Wound Measurements Length: (cm) 1 Width: (cm) 0.5 Depth: (cm) 0.5 Area: (cm) 0.393 Volume: (cm) 0.196 % Reduction in Area: 66.6% % Reduction in Volume: 58.4% Epithelialization: None Tunneling: No Undermining: No Wound Description Classification: Category/Stage III Exudate Amount: Medium Exudate Type: Serosanguineous Exudate Color: red, brown Foul Odor After Cleansing: No Slough/Fibrino Yes Wound Bed Granulation Amount: Medium (34-66%) Exposed Structure Granulation Quality: Pink, Pale Fascia Exposed: No Necrotic Amount: Medium  (34-66%)  Fat Layer (Subcutaneous Tissue) Exposed: Yes Necrotic Quality: Adherent Slough Tendon Exposed: No Muscle Exposed: No Joint Exposed: No Bone Exposed: No Treatment Notes Wound #2 (Calcaneus) Wound Laterality: Left, Medial Cleanser Soap and Water Discharge Instruction: Gently cleanse wound with antibacterial soap, rinse and pat dry prior to dressing wounds Peri-Wound Care [name] [name], [name] [name] L. ([id number]) Topical Primary Dressing Hydrofera Blue Ready Transfer Foam, 2.5x2.5 (in/in) Discharge Instruction: Apply Hydrofera Blue Ready to wound bed as directed Secondary Dressing ABD Pad 5x9 (in/in) Discharge Instruction: Cover with ABD pad. Secured With Compression Wrap Profore Lite LF 3 Multilayer Compression Bandaging System Discharge Instruction: Apply 3 multi-layer wrap as prescribed. Compression Stockings Add-Ons Electronic Signature(s) Signed: 08/21/2020 3:50:23 PM By: Donnamarie Poag Entered By: Donnamarie Poag on 08/21/2020 11:35:42 Marchetti, Braylinn L. ([id number]) -------------------------------------------------------------------------------- Vitals Details Patient Name: Wyer, Shawnese L. Date of Service: 08/21/2020 11:15 AM Medical Record Number: [id number] Patient Account Number: [id number] Date of Birth/Sex: November 06, 1926 (87 y.o. F) Treating RN: Donnamarie Poag Primary Care Alonni Heimsoth: [name] Other Clinician: Referring Parv Manthey: [name] Treating Connery Shiffler/Extender: Skipper Cliche in Treatment: 8 Vital Signs Time Taken: 11:32 Temperature (F): 98 Height (in): 58 Pulse (bpm): 86 Weight (lbs): 137 Respiratory Rate (breaths/min): 18 Body Mass Index (BMI): 28.6 Blood Pressure (mmHg): 124/69 Reference Range: 80 - 120 mg / dl Electronic Signature(s) Signed: 08/21/2020 3:50:23 PM By: Donnamarie Poag Entered ByDonnamarie Poag on 08/21/2020 11:31:24

## 2020-08-21 NOTE — Progress Notes (Addendum)
Selk, Leasia L. ([id number]) Visit Report for 08/21/2020 Chief Complaint Document Details Patient Name: [name] [name], [name] Date of Service: 08/21/2020 11:15 AM Medical Record Number: [id number] Patient Account Number: [id number] Date of Birth/Sex: 22-May-1926 (87 y.o. F) Treating RN: Donnamarie Poag Primary Care Provider: [name] Other Clinician: Referring Provider: [name] Treating Provider/Extender: Skipper Cliche in Treatment: 8 Information Obtained from: Patient Chief Complaint Left Heel Pressure Ulcer Electronic Signature(s) Signed: 08/21/2020 11:19:09 AM By: Worthy Keeler PA-C Entered By: Worthy Keeler on 08/21/2020 11:19:09 [name] [name], [name] ([id number]) -------------------------------------------------------------------------------- Debridement Details Patient Name: [name], [name] Date of Service: 08/21/2020 11:15 AM Medical Record Number: [id number] Patient Account Number: [id number] Date of Birth/Sex: [birth year] (87 y.o. F) Treating RN: Donnamarie Poag Primary Care Provider: [name] Other Clinician: Referring Provider: [name] Treating Provider/Extender: Skipper Cliche in Treatment: 8 Debridement Performed for Wound #2 Left,Medial Calcaneus Assessment: Performed By: Physician Tommie Sams., PA-C Debridement Type: Debridement Level of Consciousness (Pre- Awake and Alert procedure): Pre-procedure Verification/Time Out Yes - 11:46 Taken: Total Area Debrided (L x W): 1 (cm) x 0.5 (cm) = 0.5 (cm) Tissue and other material Subcutaneous, Biofilm debrided: Level: Skin/Subcutaneous Tissue Debridement Description: Excisional Instrument: Curette Bleeding: Minimum Hemostasis Achieved: Pressure End Time: 11:48 Response to Treatment: Procedure was tolerated well Level of Consciousness (Post- Awake and Alert procedure): Post Debridement Measurements of Total Wound Length: (cm) 1 Stage: Category/Stage III Width: (cm) 0.5 Depth: (cm)  0.5 Volume: (cm) 0.196 Character of Wound/Ulcer Post Debridement: Improved Post Procedure Diagnosis Same as Pre-procedure Electronic Signature(s) Signed: 08/21/2020 3:50:23 PM By: Donnamarie Poag Signed: 08/21/2020 4:57:45 PM By: Worthy Keeler PA-C Entered By: Donnamarie Poag on 08/21/2020 11:47:53 [name] [name], [name] ([id number]) -------------------------------------------------------------------------------- HPI Details Patient Name: [name], [name] Date of Service: 08/21/2020 11:15 AM Medical Record Number: [id number] Patient Account Number: [id number] Date of Birth/Sex: Jun 20, 1926 (87 y.o. F) Treating RN: Donnamarie Poag Primary Care Provider: [name] Other Clinician: Referring Provider: [name] Treating Provider/Extender: Skipper Cliche in Treatment: 8 History of Present Illness HPI Description: 87 year old patient was recently seen by the PCPs office for significant pain right great toe which has been going on since July. She was initially treated with Keflex which she did not complete and after the office visit this time she has been put on doxycycline. at the time of her visit she was found to have a ulcer on the plantar surface of the right great toe and also had a pyogenic granuloma over this area. X-ray of the right foot done 12/29/2014 -- IMPRESSION:Soft-tissue swelling and ulceration right great toe. No underlying bony lytic lesion identified. If osteomyelitis remains of clinical concern MRI can be obtained. Past medical history significant for anemia, chronic kidney disease stage III, obesity, varicose veins, coronary artery disease, gout, history of nicotine addiction given up smoking in 2002, hypertension, status post cardiac catheterization, status post abdominal hysterectomy, cholecystectomy and tonsillectomy. hemoglobin A1c done in August was 5.8 01/22/2015 -- at this stage the [facility] Walking boat was going to cost them significant amount of money and they want to defer  using that at the present time. 01/29/2015 -- she had a podiatry appointment and they have trimmed her toenails. She has not heard back from the vascular office regarding her venous duplex study and I have asked them to call personally so that they can get the appointment soon. 02/06/2015 -- they have made contact with the vascular office and from what I understand that test has been done but  the report is pending. 02/19/2014 -- the vascular test is scheduled for tomorrow Readmission: 06/26/2020 upon evaluation today patient appears for initial evaluation in her clinic that she has been here before in 2016 and has been quite sometime. She did have a fractured hip in February on the 23rd 2022. Subsequently this had to be pinned and she ended up with a pressure injury on her heel following when she was using her foot to help move her around in the bed. Subsequently this has led to the wound that she has been dealing with since that time. She lives at home with her daughter currently she does have dementia. 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 07/03/2020 upon evaluation today patient appears to be doing well with regard to her heel ulcer. She did have arterial studies they appear to be doing excellent it was premature normal across the board with TBI's in the 90s and ABIs well within normal range. Nonetheless there does not appear to be any signs of arterial insufficiency whatsoever. With that being said the patient does have also signs of improvement there is some necrotic tissue in the base of the wound number to try to clear some of that away today I do believe the Iodoflex/Iodosorb is doing well. 5/24; difficult punched out wound on the left medial heel. She has been using Iodoflex 07/17/2020 upon evaluation today patient appears to be doing well at this point in  regard to her wound. I do feel like this is a little bit deeper but again that is what is expected as we continue with the Iodoflex I think that this is just going to get deeper until we get to the base of the wound. With that being said I think we are getting much closer to the base of the wound where we can have healthier tissue that we get a be managing here which that will be awesome. In the meantime I am not surprised by what I am seeing and in fact the wound appears to be better as compared to previous findings. 07/24/2020 upon evaluation today patient appears to be doing well with regard to her wound. Overall I am extremely pleased with where things stand today. I do not see any signs of active infection which is great and overall I think that the patient is making good progress. There is good to be some need for sharp debridement today. 07/31/2020 upon evaluation today patient appears to be doing better in regard to her heel ulcer. She has been tolerating the dressing changes without complication. Fortunately there does not appear to be any signs of active infection which is great and overall very pleased in this regard. No fevers, chills, nausea, vomiting, or diarrhea. 08/14/2020 upon evaluation today patient appears to be doing better in regard to her heel ulcer. I am very pleased in that regard. Unfortunately she still has a slight deep tissue injury in regard to the medial portion of her foot over the same area. Unfortunately I think this is something that if were not careful it was can open up into the wound. That is my main concern here based on what I see. Fortunately there does not appear to be any evidence of active infection which is great news and overall very pleased with where things stand at this point. 08/21/2020 upon evaluation today patient appears to be doing well with regard  to her wound. She has been tolerating the dressing changes without complication. Fortunately there does  not appear to be any signs of active infection at this time. No fevers, chills, nausea, vomiting, or diarrhea. I do believe the compression wrap was beneficial for her. Electronic Signature(s) Signed: 08/21/2020 11:52:09 AM By: Worthy Keeler PA-C Entered By: Worthy Keeler on 08/21/2020 11:52:09 [name] [name], [name] ([id number]) -------------------------------------------------------------------------------- Physical Exam Details Patient Name: [name] [name], [name] Date of Service: 08/21/2020 11:15 AM Medical Record Number: [id number] Patient Account Number: [id number] Date of Birth/Sex: 20-May-1926 (87 y.o. F) Treating RN: Donnamarie Poag Primary Care Provider: [name] Other Clinician: Referring Provider: [name] Treating Provider/Extender: Skipper Cliche in Treatment: 8 Constitutional Well-nourished and well-hydrated in no acute distress. Respiratory normal breathing without difficulty. Psychiatric this patient is able to make decisions and demonstrates good insight into disease process. Alert and Oriented x 3. pleasant and cooperative. Notes Patient's wound bed showed signs of good granulation epithelization at this point. There does not appear to be any signs of active infection which is great and overall I am extremely pleased with where things stand today. No fevers, chills, nausea, vomiting, or diarrhea. Electronic Signature(s) Signed: 08/21/2020 11:52:29 AM By: Worthy Keeler PA-C Entered By: Worthy Keeler on 08/21/2020 11:52:28 [name] [name], [name] ([id number]) -------------------------------------------------------------------------------- Physician Orders Details Patient Name: [name] [name], [name] Date of Service: 08/21/2020 11:15 AM Medical Record Number: [id number] Patient Account Number: [id number] Date of Birth/Sex: 1926-04-23 (87 y.o. F) Treating RN: Donnamarie Poag Primary Care Provider: [name] Other Clinician: Referring Provider: [name] Treating  Provider/Extender: Skipper Cliche in Treatment: 8 Verbal / Phone Orders: No Diagnosis Coding ICD-10 Coding Code Description [phone] Pressure ulcer of left heel, stage 3 I10 Essential (primary) hypertension F01.50 Vascular dementia without behavioral disturbance I25.10 Atherosclerotic heart disease of native coronary artery without angina pectoris Follow-up Appointments o Return Appointment in 1 week. o Nurse Visit as needed Bathing/ Shower/ Hygiene o May shower; gently cleanse wound with antibacterial soap, rinse and pat dry prior to dressing wounds Non-Wound Condition o Additional non-wound orders/instructions: - Pad dorsal foot with remaining HYDROFERA on top for protection Off-Loading o Other: - Offloading boots while in bed; Try pillow under sheet to keep it in place. Wound Treatment Wound #2 - Calcaneus Wound Laterality: Left, Medial Cleanser: Soap and Water 1 x Per Week/30 Days Discharge Instructions: Gently cleanse wound with antibacterial soap, rinse and pat dry prior to dressing wounds Primary Dressing: Hydrofera Blue Ready Transfer Foam, 2.5x2.5 (in/in) 1 x Per Week/30 Days Discharge Instructions: Apply Hydrofera Blue Ready to wound bed as directed Secondary Dressing: ABD Pad 5x9 (in/in) 1 x Per Week/30 Days Discharge Instructions: Cover with ABD pad. Compression Wrap: Profore Lite LF 3 Multilayer Compression Bandaging System 1 x Per Week/30 Days Discharge Instructions: Apply 3 multi-layer wrap as prescribed. Electronic Signature(s) Signed: 08/21/2020 3:50:23 PM By: Donnamarie Poag Signed: 08/21/2020 4:57:45 PM By: Worthy Keeler PA-C Entered By: Donnamarie Poag on 08/21/2020 11:46:31 Osso, Colburn ([id number]) -------------------------------------------------------------------------------- Problem List Details Patient Name: [name] [name], [name] Date of Service: 08/21/2020 11:15 AM Medical Record Number: [id number] Patient Account Number: [id number] Date of  Birth/Sex: May 09, 1926 (87 y.o. F) Treating RN: Donnamarie Poag Primary Care Provider: [name] Other Clinician: Referring Provider: [name] Treating Provider/Extender: Skipper Cliche in Treatment: 8 Active Problems ICD-10 Encounter Code Description Active Date MDM Diagnosis [phone] Pressure ulcer of left heel, stage 3 06/26/2020 No Yes I10 Essential (primary) hypertension 06/26/2020 No Yes F01.50 Vascular  dementia without behavioral disturbance 06/26/2020 No Yes I25.10 Atherosclerotic heart disease of native coronary artery without angina 06/26/2020 No Yes pectoris Inactive Problems Resolved Problems Electronic Signature(s) Signed: 08/21/2020 11:19:04 AM By: Worthy Keeler PA-C Entered By: Worthy Keeler on 08/21/2020 11:19:03 [name] [name], [name] ([id number]) -------------------------------------------------------------------------------- Progress Note Details Patient Name: [name] [name], [name] Date of Service: 08/21/2020 11:15 AM Medical Record Number: [id number] Patient Account Number: [id number] Date of Birth/Sex: 25-Dec-1926 (87 y.o. F) Treating RN: Donnamarie Poag Primary Care Provider: [name] Other Clinician: Referring Provider: [name] Treating Provider/Extender: Skipper Cliche in Treatment: 8 Subjective Chief Complaint Information obtained from Patient Left Heel Pressure Ulcer History of Present Illness (HPI) 87 year old patient was recently seen by the PCPs office for significant pain right great toe which has been going on since July. She was initially treated with Keflex which she did not complete and after the office visit this time she has been put on doxycycline. at the time of her visit she was found to have a ulcer on the plantar surface of the right great toe and also had a pyogenic granuloma over this area. X-ray of the right foot done 12/29/2014 -- IMPRESSION:Soft-tissue swelling and ulceration right great toe. No underlying bony lytic lesion  identified. If osteomyelitis remains of clinical concern MRI can be obtained. Past medical history significant for anemia, chronic kidney disease stage III, obesity, varicose veins, coronary artery disease, gout, history of nicotine addiction given up smoking in 2002, hypertension, status post cardiac catheterization, status post abdominal hysterectomy, cholecystectomy and tonsillectomy. hemoglobin A1c done in August was 5.8 01/22/2015 -- at this stage the [facility] Walking boat was going to cost them significant amount of money and they want to defer using that at the present time. 01/29/2015 -- she had a podiatry appointment and they have trimmed her toenails. She has not heard back from the vascular office regarding her venous duplex study and I have asked them to call personally so that they can get the appointment soon. 02/06/2015 -- they have made contact with the vascular office and from what I understand that test has been done but the report is pending. 02/19/2014 -- the vascular test is scheduled for tomorrow Readmission: 06/26/2020 upon evaluation today patient appears for initial evaluation in her clinic that she has been here before in 2016 and has been quite sometime. She did have a fractured hip in February on the 23rd 2022. Subsequently this had to be pinned and she ended up with a pressure injury on her heel following when she was using her foot to help move her around in the bed. Subsequently this has led to the wound that she has been dealing with since that time. She lives at home with her daughter currently she does have dementia. 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 07/03/2020 upon evaluation today patient appears to be doing well with regard to her heel ulcer. She did have arterial studies they appear to be doing excellent it was premature normal  across the board with TBI's in the 90s and ABIs well within normal range. Nonetheless there does not appear to be any signs of arterial insufficiency whatsoever. With that being said the patient does have also signs of improvement there is some necrotic tissue in the base of the wound number to try to clear some of that away today I do believe the Iodoflex/Iodosorb is  doing well. 5/24; difficult punched out wound on the left medial heel. She has been using Iodoflex 07/17/2020 upon evaluation today patient appears to be doing well at this point in regard to her wound. I do feel like this is a little bit deeper but again that is what is expected as we continue with the Iodoflex I think that this is just going to get deeper until we get to the base of the wound. With that being said I think we are getting much closer to the base of the wound where we can have healthier tissue that we get a be managing here which that will be awesome. In the meantime I am not surprised by what I am seeing and in fact the wound appears to be better as compared to previous findings. 07/24/2020 upon evaluation today patient appears to be doing well with regard to her wound. Overall I am extremely pleased with where things stand today. I do not see any signs of active infection which is great and overall I think that the patient is making good progress. There is good to be some need for sharp debridement today. 07/31/2020 upon evaluation today patient appears to be doing better in regard to her heel ulcer. She has been tolerating the dressing changes without complication. Fortunately there does not appear to be any signs of active infection which is great and overall very pleased in this regard. No fevers, chills, nausea, vomiting, or diarrhea. 08/14/2020 upon evaluation today patient appears to be doing better in regard to her heel ulcer. I am very pleased in that regard. Unfortunately she still has a slight deep tissue injury  in regard to the medial portion of her foot over the same area. Unfortunately I think this is something that if were not careful it was can open up into the wound. That is my main concern here based on what I see. Fortunately there does not appear to be any evidence of active infection which is great news and overall very pleased with where things stand at this point. 08/21/2020 upon evaluation today patient appears to be doing well with regard to her wound. She has been tolerating the dressing changes without complication. Fortunately there does not appear to be any signs of active infection at this time. No fevers, chills, nausea, vomiting, or diarrhea. I do believe the compression wrap was beneficial for her. [name] [name], [name] ([id number]) Objective Constitutional Well-nourished and well-hydrated in no acute distress. Vitals Time Taken: 11:32 AM, Height: 58 in, Weight: 137 lbs, BMI: 28.6, Temperature: 98 F, Pulse: 86 bpm, Respiratory Rate: 18 breaths/min, Blood Pressure: 124/69 mmHg. Respiratory normal breathing without difficulty. Psychiatric this patient is able to make decisions and demonstrates good insight into disease process. Alert and Oriented x 3. pleasant and cooperative. General Notes: Patient's wound bed showed signs of good granulation epithelization at this point. There does not appear to be any signs of active infection which is great and overall I am extremely pleased with where things stand today. No fevers, chills, nausea, vomiting, or diarrhea. Integumentary (Hair, Skin) Wound #2 status is Open. Original cause of wound was Pressure Injury. The date acquired was: 04/11/2020. The wound has been in treatment 8 weeks. The wound is located on the Left,Medial Calcaneus. The wound measures 1cm length x 0.5cm width x 0.5cm depth; 0.393cm^2 area and 0.196cm^3 volume. There is Fat Layer (Subcutaneous Tissue) exposed. There is no tunneling or undermining noted. There is a medium  amount of serosanguineous drainage noted.  There is medium (34-66%) pink, pale granulation within the wound bed. There is a medium (34-66%) amount of necrotic tissue within the wound bed including Adherent Slough. Assessment Active Problems ICD-10 Pressure ulcer of left heel, stage 3 Essential (primary) hypertension Vascular dementia without behavioral disturbance Atherosclerotic heart disease of native coronary artery without angina pectoris Procedures Wound #2 Pre-procedure diagnosis of Wound #2 is a Pressure Ulcer located on the Left,Medial Calcaneus . There was a Excisional Skin/Subcutaneous Tissue Debridement with a total area of 0.5 sq cm performed by Tommie Sams., PA-C. With the following instrument(s): Curette Material removed includes Subcutaneous Tissue and Biofilm and. A time out was conducted at 11:46, prior to the start of the procedure. A Minimum amount of bleeding was controlled with Pressure. The procedure was tolerated well. Post Debridement Measurements: 1cm length x 0.5cm width x 0.5cm depth; 0.196cm^3 volume. Post debridement Stage noted as Category/Stage III. Character of Wound/Ulcer Post Debridement is improved. Post procedure Diagnosis Wound #2: Same as Pre-Procedure Pre-procedure diagnosis of Wound #2 is a Pressure Ulcer located on the Left,Medial Calcaneus . There was a Three Layer Compression Therapy Procedure by Donnamarie Poag, RN. Post procedure Diagnosis Wound #2: Same as Pre-Procedure Plan Follow-up Appointments: Return Appointment in 1 week. Nurse Visit as needed Bathing/ Shower/ Hygiene: [name] [name], [name] ([id number]) May shower; gently cleanse wound with antibacterial soap, rinse and pat dry prior to dressing wounds Non-Wound Condition: Additional non-wound orders/instructions: - Pad dorsal foot with remaining HYDROFERA on top for protection Off-Loading: Other: - Offloading boots while in bed; Try pillow under sheet to keep it in place. WOUND #2: -  Calcaneus Wound Laterality: Left, Medial Cleanser: Soap and Water 1 x Per Week/30 Days Discharge Instructions: Gently cleanse wound with antibacterial soap, rinse and pat dry prior to dressing wounds Primary Dressing: Hydrofera Blue Ready Transfer Foam, 2.5x2.5 (in/in) 1 x Per Week/30 Days Discharge Instructions: Apply Hydrofera Blue Ready to wound bed as directed Secondary Dressing: ABD Pad 5x9 (in/in) 1 x Per Week/30 Days Discharge Instructions: Cover with ABD pad. Compression Wrap: Profore Lite LF 3 Multilayer Compression Bandaging System 1 x Per Week/30 Days Discharge Instructions: Apply 3 multi-layer wrap as prescribed. 1. Would recommend that we continue with the wound care measures as before and the patient is in agreement with the plan. This includes the use of the [facility] dressing which I think is doing a great job cut to fit the wound bed. 2. I am also can recommend a second piece over top to add some padding. As well were also can do some padding over the region of the foot where she had a little bit of irritation and pressure occurring from her shoe. 3. I would also recommend the patient continue to elevate her leg is much as possible to try to help with edema control sleeping in the bed can also be beneficial she is doing that as well. We will see patient back for reevaluation in 1 week here in the clinic. If anything worsens or changes patient will contact our office for additional recommendations. Electronic Signature(s) Signed: 08/21/2020 11:53:08 AM By: Worthy Keeler PA-C Entered By: Worthy Keeler on 08/21/2020 11:53:08 [name] [name], [name] ([id number]) -------------------------------------------------------------------------------- SuperBill Details Patient Name: Exantus, Takiyah L. Date of Service: 08/21/2020 Medical Record Number: [id number] Patient Account Number: [id number] Date of Birth/Sex: 25-Feb-1926 (87 y.o. F) Treating RN: Donnamarie Poag Primary Care Provider:  [name] Other Clinician: Referring Provider: [name] Treating Provider/Extender: Skipper Cliche in Treatment: 8 Diagnosis  Coding ICD-10 Codes Code Description [phone] Pressure ulcer of left heel, stage 3 I10 Essential (primary) hypertension F01.50 Vascular dementia without behavioral disturbance I25.10 Atherosclerotic heart disease of native coronary artery without angina pectoris Facility Procedures CPT4 Code: [id number] Description: [id number] - DEB SUBQ TISSUE 20 SQ CM/< Modifier: Quantity: 1 CPT4 Code: Description: ICD-10 Diagnosis Description L89.623 Pressure ulcer of left heel, stage 3 Modifier: Quantity: Physician Procedures CPT4 Code: [id number] Description: 11042 - WC PHYS SUBQ TISS 20 SQ CM Modifier: Quantity: 1 CPT4 Code: Description: ICD-10 Diagnosis Description L89.623 Pressure ulcer of left heel, stage 3 Modifier: Quantity: Electronic Signature(s) Signed: 08/21/2020 11:53:23 AM By: Worthy Keeler PA-C Entered By: Worthy Keeler on 08/21/2020 11:53:23

## 2020-08-28 ENCOUNTER — Encounter: Payer: PPO | Admitting: Physician Assistant

## 2020-08-28 ENCOUNTER — Other Ambulatory Visit: Payer: Self-pay

## 2020-08-28 DIAGNOSIS — L89623 Pressure ulcer of left heel, stage 3: Secondary | ICD-10-CM | POA: Diagnosis not present

## 2020-08-28 NOTE — Progress Notes (Addendum)
[name], [name] ([id number]) Visit Report for 08/28/2020 Chief Complaint Document Details Patient Name: [name], [name] Date of Service: 08/28/2020 11:15 AM Medical Record Number: [id number] Patient Account Number: [id number] Date of Birth/Sex: 1926-07-10 (87 y.o. F) Treating RN: Dolan Amen Primary Care Provider: [name] Other Clinician: Referring Provider: [name] Treating Provider/Extender: Skipper Cliche in Treatment: 9 Information Obtained from: Patient Chief Complaint Left Heel Pressure Ulcer Electronic Signature(s) Signed: 08/28/2020 11:26:13 AM By: Worthy Keeler PA-C Entered By: Worthy Keeler on 08/28/2020 11:26:13 [name] [name], [name] ([id number]) -------------------------------------------------------------------------------- HPI Details Patient Name: [name], [name] Date of Service: 08/28/2020 11:15 AM Medical Record Number: [id number] Patient Account Number: [id number] Date of Birth/Sex: May 06, 1926 (87 y.o. F) Treating RN: Dolan Amen Primary Care Provider: [name] Other Clinician: Referring Provider: [name] Treating Provider/Extender: Skipper Cliche in Treatment: 9 History of Present Illness HPI Description: 87 year old patient was recently seen by the PCPs office for significant pain right great toe which has been going on since July. She was initially treated with Keflex which she did not complete and after the office visit this time she has been put on doxycycline. at the time of her visit she was found to have a ulcer on the plantar surface of the right great toe and also had a pyogenic granuloma over this area. X-ray of the right foot done 12/29/2014 -- IMPRESSION:Soft-tissue swelling and ulceration right great toe. No underlying bony lytic lesion identified. If osteomyelitis remains of clinical concern MRI can be obtained. Past medical history significant for anemia, chronic kidney disease stage III, obesity, varicose  veins, coronary artery disease, gout, history of nicotine addiction given up smoking in 2002, hypertension, status post cardiac catheterization, status post abdominal hysterectomy, cholecystectomy and tonsillectomy. hemoglobin A1c done in August was 5.8 01/22/2015 -- at this stage the [facility] Walking boat was going to cost them significant amount of money and they want to defer using that at the present time. 01/29/2015 -- she had a podiatry appointment and they have trimmed her toenails. She has not heard back from the vascular office regarding her venous duplex study and I have asked them to call personally so that they can get the appointment soon. 02/06/2015 -- they have made contact with the vascular office and from what I understand that test has been done but the report is pending. 02/19/2014 -- the vascular test is scheduled for tomorrow Readmission: 06/26/2020 upon evaluation today patient appears for initial evaluation in her clinic that she has been here before in 2016 and has been quite sometime. She did have a fractured hip in February on the 23rd 2022. Subsequently this had to be pinned and she ended up with a pressure injury on her heel following when she was using her foot to help move her around in the bed. Subsequently this has led to the wound that she has been dealing with since that time. She lives at home with her daughter currently she does have dementia. 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 07/03/2020 upon evaluation today patient appears to be doing well with regard to her heel ulcer. She did have arterial studies they appear to be doing excellent it was premature normal across the board with TBI's in the 90s and ABIs well within normal range. Nonetheless there does not appear to be any signs of arterial insufficiency whatsoever. With that  being said  the patient does have also signs of improvement there is some necrotic tissue in the base of the wound number to try to clear some of that away today I do believe the Iodoflex/Iodosorb is doing well. 5/24; difficult punched out wound on the left medial heel. She has been using Iodoflex 07/17/2020 upon evaluation today patient appears to be doing well at this point in regard to her wound. I do feel like this is a little bit deeper but again that is what is expected as we continue with the Iodoflex I think that this is just going to get deeper until we get to the base of the wound. With that being said I think we are getting much closer to the base of the wound where we can have healthier tissue that we get a be managing here which that will be awesome. In the meantime I am not surprised by what I am seeing and in fact the wound appears to be better as compared to previous findings. 07/24/2020 upon evaluation today patient appears to be doing well with regard to her wound. Overall I am extremely pleased with where things stand today. I do not see any signs of active infection which is great and overall I think that the patient is making good progress. There is good to be some need for sharp debridement today. 07/31/2020 upon evaluation today patient appears to be doing better in regard to her heel ulcer. She has been tolerating the dressing changes without complication. Fortunately there does not appear to be any signs of active infection which is great and overall very pleased in this regard. No fevers, chills, nausea, vomiting, or diarrhea. 08/14/2020 upon evaluation today patient appears to be doing better in regard to her heel ulcer. I am very pleased in that regard. Unfortunately she still has a slight deep tissue injury in regard to the medial portion of her foot over the same area. Unfortunately I think this is something that if were not careful it was can open up into the wound. That is  my main concern here based on what I see. Fortunately there does not appear to be any evidence of active infection which is great news and overall very pleased with where things stand at this point. 08/21/2020 upon evaluation today patient appears to be doing well with regard to her wound. She has been tolerating the dressing changes without complication. Fortunately there does not appear to be any signs of active infection at this time. No fevers, chills, nausea, vomiting, or diarrhea. I do believe the compression wrap was beneficial for her. 08/28/2020 upon evaluation today patient appears to be doing well with regard to her wounds currently. Fortunately there does not appear to be any signs of active infection at this time. No fevers, chills, nausea, vomiting, or diarrhea. With that being said I think that her leg is doing quite well to be honest. Electronic Signature(s) Signed: 08/28/2020 1:17:06 PM By: Worthy Keeler PA-C Entered By: Worthy Keeler on 08/28/2020 13:17:06 [name] [name], [name] ([id number]) [name] [name], [name] ([id number]) -------------------------------------------------------------------------------- Physical Exam Details Patient Name: [name] [name], [name] Date of Service: 08/28/2020 11:15 AM Medical Record Number: [id number] Patient Account Number: [id number] Date of Birth/Sex: Jul 11, 1926 (87 y.o. F) Treating RN: Dolan Amen Primary Care Provider: [name] Other Clinician: Referring Provider: [name] Treating Provider/Extender: Skipper Cliche in Treatment: 9 Constitutional Well-nourished and well-hydrated in no acute distress. Respiratory normal breathing without difficulty. Psychiatric this patient is able to make decisions  and demonstrates good insight into disease process. Alert and Oriented x 3. pleasant and cooperative. Notes Upon inspection patient's wound bed showed signs of good granulation epithelization. There was no need for sharp debridement in  general I am extremely pleased with where the wound on her heel is currently. Electronic Signature(s) Signed: 08/28/2020 1:17:24 PM By: Worthy Keeler PA-C Entered By: Worthy Keeler on 08/28/2020 13:17:23 [name] [name], [name] ([id number]) -------------------------------------------------------------------------------- Physician Orders Details Patient Name: [name] [name], [name] Date of Service: 08/28/2020 11:15 AM Medical Record Number: [id number] Patient Account Number: [id number] Date of Birth/Sex: October 07, 1926 (87 y.o. F) Treating RN: Donnamarie Poag Primary Care Provider: [name] Other Clinician: Referring Provider: [name] Treating Provider/Extender: Skipper Cliche in Treatment: 9 Verbal / Phone Orders: No Diagnosis Coding ICD-10 Coding Code Description [phone] Pressure ulcer of left heel, stage 3 I10 Essential (primary) hypertension F01.50 Vascular dementia without behavioral disturbance I25.10 Atherosclerotic heart disease of native coronary artery without angina pectoris Follow-up Appointments o Return Appointment in 1 week. o Nurse Visit as needed Bathing/ Shower/ Hygiene o May shower; gently cleanse wound with antibacterial soap, rinse and pat dry prior to dressing wounds Non-Wound Condition o Additional non-wound orders/instructions: - Pad dorsal foot with remaining HYDROFERA on top for protection Off-Loading o Other: - Offloading boots while in bed; Try pillow under sheet to keep it in place. Wound Treatment Wound #2 - Calcaneus Wound Laterality: Left, Medial Cleanser: Soap and Water 1 x Per Week/30 Days Discharge Instructions: Gently cleanse wound with antibacterial soap, rinse and pat dry prior to dressing wounds Primary Dressing: Hydrofera Blue Ready Transfer Foam, 2.5x2.5 (in/in) 1 x Per Week/30 Days Discharge Instructions: Apply Hydrofera Blue Ready to wound bed as directed Secondary Dressing: ABD Pad 5x9 (in/in) 1 x Per Week/30 Days Discharge  Instructions: Cover with ABD pad. Compression Wrap: Profore Lite LF 3 Multilayer Compression Bandaging System 1 x Per Week/30 Days Discharge Instructions: Apply 3 multi-layer wrap as prescribed. Patient Medications Allergies: simvastatin, atorvastatin, lovastatin Notifications Medication Indication Start End nystatin 08/28/2020 DOSE topical 100,000 unit/gram powder - powder topical applied to the gluteal region 2 times per day for 30 days Electronic Signature(s) Signed: 08/28/2020 1:23:16 PM By: Worthy Keeler PA-C Previous Signature: 08/28/2020 12:48:21 PM Version By: Donnamarie Poag Entered By: Worthy Keeler on 08/28/2020 13:23:16 [name] [name], [name] ([id number]) -------------------------------------------------------------------------------- Problem List Details Patient Name: Belasco, Latisia L. Date of Service: 08/28/2020 11:15 AM Medical Record Number: [id number] Patient Account Number: [id number] Date of Birth/Sex: 1927-01-18 (87 y.o. F) Treating RN: Dolan Amen Primary Care Provider: [name] Other Clinician: Referring Provider: [name] Treating Provider/Extender: Skipper Cliche in Treatment: 9 Active Problems ICD-10 Encounter Code Description Active Date MDM Diagnosis [phone] Pressure ulcer of left heel, stage 3 06/26/2020 No Yes I10 Essential (primary) hypertension 06/26/2020 No Yes F01.50 Vascular dementia without behavioral disturbance 06/26/2020 No Yes I25.10 Atherosclerotic heart disease of native coronary artery without angina 06/26/2020 No Yes pectoris Inactive Problems Resolved Problems Electronic Signature(s) Signed: 08/28/2020 11:26:06 AM By: Worthy Keeler PA-C Entered By: Worthy Keeler on 08/28/2020 11:26:06 [name] [name], [name] ([id number]) -------------------------------------------------------------------------------- Progress Note Details Patient Name: [name] [name], [name] Date of Service: 08/28/2020 11:15 AM Medical Record Number:  [id number] Patient Account Number: [id number] Date of Birth/Sex: July 21, 1926 (87 y.o. F) Treating RN: Dolan Amen Primary Care Provider: [name] Other Clinician: Referring Provider: [name] Treating Provider/Extender: Skipper Cliche in Treatment: 9 Subjective Chief Complaint Information obtained from Patient Left Heel Pressure Ulcer History of Present Illness (  HPI) 87 year old patient was recently seen by the PCPs office for significant pain right great toe which has been going on since July. She was initially treated with Keflex which she did not complete and after the office visit this time she has been put on doxycycline. at the time of her visit she was found to have a ulcer on the plantar surface of the right great toe and also had a pyogenic granuloma over this area. X-ray of the right foot done 12/29/2014 -- IMPRESSION:Soft-tissue swelling and ulceration right great toe. No underlying bony lytic lesion identified. If osteomyelitis remains of clinical concern MRI can be obtained. Past medical history significant for anemia, chronic kidney disease stage III, obesity, varicose veins, coronary artery disease, gout, history of nicotine addiction given up smoking in 2002, hypertension, status post cardiac catheterization, status post abdominal hysterectomy, cholecystectomy and tonsillectomy. hemoglobin A1c done in August was 5.8 01/22/2015 -- at this stage the [facility] Walking boat was going to cost them significant amount of money and they want to defer using that at the present time. 01/29/2015 -- she had a podiatry appointment and they have trimmed her toenails. She has not heard back from the vascular office regarding her venous duplex study and I have asked them to call personally so that they can get the appointment soon. 02/06/2015 -- they have made contact with the vascular office and from what I understand that test has been done but the report is pending. 02/19/2014 --  the vascular test is scheduled for tomorrow Readmission: 06/26/2020 upon evaluation today patient appears for initial evaluation in her clinic that she has been here before in 2016 and has been quite sometime. She did have a fractured hip in February on the 23rd 2022. Subsequently this had to be pinned and she ended up with a pressure injury on her heel following when she was using her foot to help move her around in the bed. Subsequently this has led to the wound that she has been dealing with since that time. She lives at home with her daughter currently she does have dementia. 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 07/03/2020 upon evaluation today patient appears to be doing well with regard to her heel ulcer. She did have arterial studies they appear to be doing excellent it was premature normal across the board with TBI's in the 90s and ABIs well within normal range. Nonetheless there does not appear to be any signs of arterial insufficiency whatsoever. With that being said the patient does have also signs of improvement there is some necrotic tissue in the base of the wound number to try to clear some of that away today I do believe the Iodoflex/Iodosorb is doing well. 5/24; difficult punched out wound on the left medial heel. She has been using Iodoflex 07/17/2020 upon evaluation today patient appears to be doing well at this point in regard to her wound. I do feel like this is a little bit deeper but again that is what is expected as we continue with the Iodoflex I think that this is just going to get deeper until we get to the base of the wound. With that being said I think we are getting much closer to the base of the wound where we can have healthier tissue that we get a be managing here which that will be awesome. In the meantime I am  not surprised by what  I am seeing and in fact the wound appears to be better as compared to previous findings. 07/24/2020 upon evaluation today patient appears to be doing well with regard to her wound. Overall I am extremely pleased with where things stand today. I do not see any signs of active infection which is great and overall I think that the patient is making good progress. There is good to be some need for sharp debridement today. 07/31/2020 upon evaluation today patient appears to be doing better in regard to her heel ulcer. She has been tolerating the dressing changes without complication. Fortunately there does not appear to be any signs of active infection which is great and overall very pleased in this regard. No fevers, chills, nausea, vomiting, or diarrhea. 08/14/2020 upon evaluation today patient appears to be doing better in regard to her heel ulcer. I am very pleased in that regard. Unfortunately she still has a slight deep tissue injury in regard to the medial portion of her foot over the same area. Unfortunately I think this is something that if were not careful it was can open up into the wound. That is my main concern here based on what I see. Fortunately there does not appear to be any evidence of active infection which is great news and overall very pleased with where things stand at this point. 08/21/2020 upon evaluation today patient appears to be doing well with regard to her wound. She has been tolerating the dressing changes without complication. Fortunately there does not appear to be any signs of active infection at this time. No fevers, chills, nausea, vomiting, or diarrhea. I do believe the compression wrap was beneficial for her. 08/28/2020 upon evaluation today patient appears to be doing well with regard to her wounds currently. Fortunately there does not appear to be any signs of active infection at this time. No fevers, chills, nausea, vomiting, or diarrhea. With that being said I think  that her leg is doing quite well to be honest. [name] [name], [name] ([id number]) Objective Constitutional Well-nourished and well-hydrated in no acute distress. Vitals Time Taken: 11:25 AM, Height: 58 in, Weight: 137 lbs, BMI: 28.6, Temperature: 98.6 F, Pulse: 90 bpm, Respiratory Rate: 18 breaths/min, Blood Pressure: 101/63 mmHg. Respiratory normal breathing without difficulty. Psychiatric this patient is able to make decisions and demonstrates good insight into disease process. Alert and Oriented x 3. pleasant and cooperative. General Notes: Upon inspection patient's wound bed showed signs of good granulation epithelization. There was no need for sharp debridement in general I am extremely pleased with where the wound on her heel is currently. Integumentary (Hair, Skin) Wound #2 status is Open. Original cause of wound was Pressure Injury. The date acquired was: 04/11/2020. The wound has been in treatment 9 weeks. The wound is located on the Left,Medial Calcaneus. The wound measures 0.8cm length x 0.8cm width x 0.5cm depth; 0.503cm^2 area and 0.251cm^3 volume. There is Fat Layer (Subcutaneous Tissue) exposed. There is no tunneling or undermining noted. There is a medium amount of serosanguineous drainage noted. There is medium (34-66%) pink, pale granulation within the wound bed. There is a medium (34- 66%) amount of necrotic tissue within the wound bed including Adherent Slough. Assessment Active Problems ICD-10 Pressure ulcer of left heel, stage 3 Essential (primary) hypertension Vascular dementia without behavioral disturbance Atherosclerotic heart disease of native coronary artery without angina pectoris Procedures Wound #2 Pre-procedure diagnosis of Wound #2 is a Pressure Ulcer located on the Left,Medial Calcaneus .  There was a Three Layer Compression Therapy Procedure by Donnamarie Poag, RN. Post procedure Diagnosis Wound #2: Same as Pre-Procedure Plan Follow-up Appointments: Return  Appointment in 1 week. Nurse Visit as needed Bathing/ Shower/ Hygiene: May shower; gently cleanse wound with antibacterial soap, rinse and pat dry prior to dressing wounds Non-Wound Condition: Additional non-wound orders/instructions: - Pad dorsal foot with remaining HYDROFERA on top for protection Off-Loading: [name] [name], [name] ([id number]) Other: - Offloading boots while in bed; Try pillow under sheet to keep it in place. The following medication(s) was prescribed: nystatin topical 100,000 unit/gram powder powder topical applied to the gluteal region 2 times per day for 30 days starting 08/28/2020 WOUND #2: - Calcaneus Wound Laterality: Left, Medial Cleanser: Soap and Water 1 x Per Week/30 Days Discharge Instructions: Gently cleanse wound with antibacterial soap, rinse and pat dry prior to dressing wounds Primary Dressing: Hydrofera Blue Ready Transfer Foam, 2.5x2.5 (in/in) 1 x Per Week/30 Days Discharge Instructions: Apply Hydrofera Blue Ready to wound bed as directed Secondary Dressing: ABD Pad 5x9 (in/in) 1 x Per Week/30 Days Discharge Instructions: Cover with ABD pad. Compression Wrap: Profore Lite LF 3 Multilayer Compression Bandaging System 1 x Per Week/30 Days Discharge Instructions: Apply 3 multi-layer wrap as prescribed. 1. I would recommend that we going continue with the wound care measures as before and the patient is in agreement with the plan. This includes the use of a Hydrofera Blue dressing which is good I think this is appropriate thing to do at this point. 2. I am also can recommend that we continue with ABD pad to cover followed by 3 layer compression wrap. 3. It was after the patient had actually gotten up and was exiting the room that her daughter mentioned that there was an issue with her gluteal region. With that being said I did review a picture that the nurse showed me as I was already out of the room and into another room at that point. Subsequently looks like  the patient could have some issues with a yeast dermatitis here. Is definitely moisture related issues. With that being said I Georgina Peer see about sending in some nystatin powder for the patient to see if this can be beneficial to try to help clear this up. We will see how things look next visit I will actually have a formal check on this. We will see patient back for reevaluation in 1 week here in the clinic. If anything worsens or changes patient will contact our office for additional recommendations. Electronic Signature(s) Signed: 08/28/2020 1:23:30 PM By: Worthy Keeler PA-C Entered By: Worthy Keeler on 08/28/2020 13:23:30 [name] [name], [name] ([id number]) -------------------------------------------------------------------------------- SuperBill Details Patient Name: [name] [name], [name] Date of Service: 08/28/2020 Medical Record Number: [id number] Patient Account Number: [id number] Date of Birth/Sex: [birth year] (87 y.o. F) Treating RN: Donnamarie Poag Primary Care Provider: [name] Other Clinician: Referring Provider: [name] Treating Provider/Extender: Skipper Cliche in Treatment: 9 Diagnosis Coding ICD-10 Codes Code Description [phone] Pressure ulcer of left heel, stage 3 I10 Essential (primary) hypertension F01.50 Vascular dementia without behavioral disturbance I25.10 Atherosclerotic heart disease of native coronary artery without angina pectoris Facility Procedures CPT4 Code: [id number] Description: (Facility Use Only) [phone] - [facility] LEG Modifier: Quantity: 1 Physician Procedures CPT4 Code: [id number] Description: [id number] - WC PHYS LEVEL 4 - EST PT Modifier: Quantity: 1 CPT4 Code: Description: ICD-10 Diagnosis Description L89.623 Pressure ulcer of left heel, stage 3 I10 Essential (primary) hypertension F01.50 Vascular dementia without behavioral  disturbance I25.10 Atherosclerotic heart disease of native coronary artery without  angina Modifier:  pectoris Quantity: Electronic Signature(s) Signed: 08/28/2020 1:23:48 PM By: Worthy Keeler PA-C Previous Signature: 08/28/2020 12:48:21 PM Version By: Donnamarie Poag Entered By: Worthy Keeler on 08/28/2020 13:23:47

## 2020-08-28 NOTE — Progress Notes (Signed)
[name], [name] ([id number]) Visit Report for 08/28/2020 Arrival Information Details Patient Name: [name] [name], [name] Date of Service: 08/28/2020 11:15 AM Medical Record Number: [id number] Patient Account Number: [id number] Date of Birth/Sex: May 15, 1926 (87 y.o. F) Treating RN: Donnamarie Poag Primary Care Graciemae Delisle: [name] Other Clinician: Referring Idania Desouza: [name] Treating Merelyn Klump/Extender: Skipper Cliche in Treatment: 9 Visit Information History Since Last Visit Added or deleted any medications: No Patient Arrived: Wheel Chair Had a fall or experienced change in No Arrival Time: 11:20 activities of daily living that may affect Accompanied By: daughter risk of falls: Transfer Assistance: EasyPivot Patient Lift Hospitalized since last visit: No Patient Identification Verified: Yes Has Dressing in Place as Prescribed: Yes Secondary Verification Process Completed: Yes Has Compression in Place as Prescribed: Yes Patient Requires Transmission-Based No Pain Present Now: No Precautions: Patient Has Alerts: Yes Patient Alerts: NOT diabetic ABI R1.18 L1.25 06/27/20 Electronic Signature(s) Signed: 08/28/2020 12:48:21 PM By: Donnamarie Poag Entered ByDonnamarie Poag on 08/28/2020 11:25:59 Haugan, Sullivan L. ([id number]) -------------------------------------------------------------------------------- Clinic Level of Care Assessment Details Patient Name: [name], [name] Date of Service: 08/28/2020 11:15 AM Medical Record Number: [id number] Patient Account Number: [id number] Date of Birth/Sex: March 01, 1926 (87 y.o. F) Treating RN: Donnamarie Poag Primary Care Graceyn Fodor: [name] Other Clinician: Referring Marya Lowden: [name] Treating Khaleef Ruby/Extender: Skipper Cliche in Treatment: 9 Clinic Level of Care Assessment Items TOOL 1 Quantity Score []  - Use when EandM and Procedure is performed on INITIAL visit 0 ASSESSMENTS - Nursing Assessment / Reassessment []  -  General Physical Exam (combine w/ comprehensive assessment (listed just below) when performed on new 0 pt. evals) []  - 0 Comprehensive Assessment (HX, ROS, Risk Assessments, Wounds Hx, etc.) ASSESSMENTS - Wound and Skin Assessment / Reassessment []  - Dermatologic / Skin Assessment (not related to wound area) 0 ASSESSMENTS - Ostomy and/or Continence Assessment and Care []  - Incontinence Assessment and Management 0 []  - 0 Ostomy Care Assessment and Management (repouching, etc.) PROCESS - Coordination of Care []  - Simple Patient / Family Education for ongoing care 0 []  - 0 Complex (extensive) Patient / Family Education for ongoing care []  - 0 Staff obtains Programmer, systems, Records, Test Results / Process Orders []  - 0 Staff telephones HHA, Nursing Homes / Clarify orders / etc []  - 0 Routine Transfer to another Facility (non-emergent condition) []  - 0 Routine Hospital Admission (non-emergent condition) []  - 0 New Admissions / Biomedical engineer / Ordering NPWT, Apligraf, etc. []  - 0 Emergency Hospital Admission (emergent condition) PROCESS - Special Needs []  - Pediatric / Minor Patient Management 0 []  - 0 Isolation Patient Management []  - 0 Hearing / Language / Visual special needs []  - 0 Assessment of Community assistance (transportation, D/C planning, etc.) []  - 0 Additional assistance / Altered mentation []  - 0 Support Surface(s) Assessment (bed, cushion, seat, etc.) INTERVENTIONS - Miscellaneous []  - External ear exam 0 []  - 0 Patient Transfer (multiple staff / Civil Service fast streamer / Similar devices) []  - 0 Simple Staple / Suture removal (25 or less) []  - 0 Complex Staple / Suture removal (26 or more) []  - 0 Hypo/Hyperglycemic Management (do not check if billed separately) []  - 0 Ankle / Brachial Index (ABI) - do not check if billed separately Has the patient been seen at the hospital within the last three years: Yes Total Score: 0 Level Of Care: ____ Dipiero, Sabena L.  ([id number]) Electronic Signature(s) Signed: 08/28/2020 12:48:21 PM By: Donnamarie Poag Entered By: Donnamarie Poag on 08/28/2020 11:55:36 Hasan, Laysa L. (  [id number]) -------------------------------------------------------------------------------- Compression Therapy Details Patient Name: [name], [name] Date of Service: 08/28/2020 11:15 AM Medical Record Number: [id number] Patient Account Number: [id number] Date of Birth/Sex: [birth year] (87 y.o. F) Treating RN: Donnamarie Poag Primary Care Paislynn Hegstrom: [name] Other Clinician: Referring Viveca Beckstrom: [name] Treating Nevaeh Korte/Extender: Skipper Cliche in Treatment: 9 Compression Therapy Performed for Wound Assessment: Wound #2 Left,Medial Calcaneus Performed By: Clinician Donnamarie Poag, RN Compression Type: Three Layer Post Procedure Diagnosis Same as Pre-procedure Electronic Signature(s) Signed: 08/28/2020 12:48:21 PM By: Donnamarie Poag Entered By: Donnamarie Poag on 08/28/2020 11:54:57 Rabine, Franchesca L. ([id number]) -------------------------------------------------------------------------------- Encounter Discharge Information Details Patient Name: [name], [name] Date of Service: 08/28/2020 11:15 AM Medical Record Number: [id number] Patient Account Number: [id number] Date of Birth/Sex: 1926/06/13 (87 y.o. F) Treating RN: Donnamarie Poag Primary Care Mixtli Reno: [name] Other Clinician: Referring Harry Bark: [name] Treating Anise Harbin/Extender: Skipper Cliche in Treatment: 9 Encounter Discharge Information Items Discharge Condition: Stable Ambulatory Status: Wheelchair Discharge Destination: Home Transportation: Private Auto Accompanied By: daughter Schedule Follow-up Appointment: Yes Clinical Summary of Care: Electronic Signature(s) Signed: 08/28/2020 12:48:21 PM By: Donnamarie Poag Entered ByDonnamarie Poag on 08/28/2020 12:05:41 Guarnieri, Solei L.  ([id number]) -------------------------------------------------------------------------------- Lower Extremity Assessment Details Patient Name: [name], [name] Date of Service: 08/28/2020 11:15 AM Medical Record Number: [id number] Patient Account Number: [id number] Date of Birth/Sex: September 08, 1926 (87 y.o. F) Treating RN: Donnamarie Poag Primary Care Margaux Engen: [name] Other Clinician: Referring Elea Holtzclaw: [name] Treating Royden Bulman/Extender: Skipper Cliche in Treatment: 9 Edema Assessment Assessed: Shirlyn Goltz: Yes] Patrice Paradise: No] [Left: Edema] [Right: :] Calf Left: Right: Point of Measurement: 32 cm From Medial Instep 32 cm Ankle Left: Right: Point of Measurement: 12 cm From Medial Instep 21 cm Vascular Assessment Pulses: Dorsalis Pedis Palpable: [Left:Yes] Electronic Signature(s) Signed: 08/28/2020 12:48:21 PM By: Donnamarie Poag Entered ByDonnamarie Poag on 08/28/2020 11:33:22 Corriveau, Antonette L. ([id number]) -------------------------------------------------------------------------------- Multi Wound Chart Details Patient Name: Luebbe, Ila L. Date of Service: 08/28/2020 11:15 AM Medical Record Number: [id number] Patient Account Number: [id number] Date of Birth/Sex: 03-16-1926 (87 y.o. F) Treating RN: Donnamarie Poag Primary Care Kadar Chance: [name] Other Clinician: Referring Tyrece Vanterpool: [name] Treating Chante Mayson/Extender: Skipper Cliche in Treatment: 9 Vital Signs Height(in): 28 Pulse(bpm): 86 Weight(lbs): 137 Blood Pressure(mmHg): 101/63 Body Mass Index(BMI): 29 Temperature(F): 98.6 Respiratory Rate(breaths/min): 18 Photos: [N/A:N/A] Wound Location: Left, Medial Calcaneus N/A N/A Wounding Event: Pressure Injury N/A N/A Primary Etiology: Pressure Ulcer N/A N/A Comorbid History: Cataracts, Anemia, Coronary Artery N/A N/A Disease, Hypertension, Myocardial Infarction, End Stage Renal Disease, Gout, Osteoarthritis, Dementia Date Acquired: 04/11/2020 N/A  N/A Weeks of Treatment: 9 N/A N/A Wound Status: Open N/A N/A Measurements L x W x D (cm) 0.8x0.8x0.5 N/A N/A Area (cm) : 0.503 N/A N/A Volume (cm) : 0.251 N/A N/A % Reduction in Area: 57.30% N/A N/A % Reduction in Volume: 46.70% N/A N/A Classification: Category/Stage III N/A N/A Exudate Amount: Medium N/A N/A Exudate Type: Serosanguineous N/A N/A Exudate Color: red, brown N/A N/A Granulation Amount: Medium (34-66%) N/A N/A Granulation Quality: Pink, Pale N/A N/A Necrotic Amount: Medium (34-66%) N/A N/A Exposed Structures: Fat Layer (Subcutaneous Tissue): N/A N/A Yes Fascia: No Tendon: No Muscle: No Joint: No Bone: No Epithelialization: None N/A N/A Treatment Notes Electronic Signature(s) Signed: 08/28/2020 12:48:21 PM By: Donnamarie Poag Entered ByDonnamarie Poag on 08/28/2020 11:54:42 Beigel, Kristina L. ([id number]) -------------------------------------------------------------------------------- [place] Details Patient Name: Sorbo, Cynia L. Date of Service: 08/28/2020 11:15 AM Medical Record Number: [id number] Patient Account Number: [id number] Date of Birth/Sex: 15-Apr-1926 (87  y.o. F) Treating RN: Donnamarie Poag Primary Care Johnda Billiot: [name] Other Clinician: Referring Gaspard Isbell: [name] Treating Currie Dennin/Extender: Skipper Cliche in Treatment: 9 Active Inactive Wound/Skin Impairment Nursing Diagnoses: Impaired tissue integrity Goals: Patient/caregiver will verbalize understanding of skin care regimen Date Initiated: 06/26/2020 Date Inactivated: 08/21/2020 Target Resolution Date: 06/26/2020 Goal Status: Met Ulcer/skin breakdown will have a volume reduction of 30% by week 4 Date Initiated: 06/26/2020 Date Inactivated: 08/21/2020 Target Resolution Date: 07/27/2020 Goal Status: Met Ulcer/skin breakdown will have a volume reduction of 50% by week 8 Date Initiated: 06/26/2020 Target Resolution Date: 08/26/2020 Goal Status: Active Ulcer/skin  breakdown will have a volume reduction of 80% by week 12 Date Initiated: 06/26/2020 Target Resolution Date: 09/26/2020 Goal Status: Active Ulcer/skin breakdown will heal within 14 weeks Date Initiated: 06/26/2020 Target Resolution Date: 10/27/2020 Goal Status: Active Interventions: Assess patient/caregiver ability to obtain necessary supplies Assess patient/caregiver ability to perform ulcer/skin care regimen upon admission and as needed Assess ulceration(s) every visit Provide education on ulcer and skin care Treatment Activities: Referred to DME Virlan Kempker for dressing supplies : 06/26/2020 Skin care regimen initiated : 06/26/2020 Notes: Electronic Signature(s) Signed: 08/28/2020 12:48:21 PM By: Donnamarie Poag Entered ByDonnamarie Poag on 08/28/2020 11:54:28 Wonnacott, Remmy L. ([id number]) -------------------------------------------------------------------------------- Pain Assessment Details Patient Name: Falcon, Tahj L. Date of Service: 08/28/2020 11:15 AM Medical Record Number: [id number] Patient Account Number: [id number] Date of Birth/Sex: [birth year] (87 y.o. F) Treating RN: Donnamarie Poag Primary Care Wynne Jury: [name] Other Clinician: Referring Robi Mitter: [name] Treating Kimberly Nieland/Extender: Skipper Cliche in Treatment: 9 Active Problems Location of Pain Severity and Description of Pain Patient Has Paino No Site Locations Rate the pain. Current Pain Level: 0 Pain Management and Medication Current Pain Management: Electronic Signature(s) Signed: 08/28/2020 12:48:21 PM By: Donnamarie Poag Entered By: Donnamarie Poag on 08/28/2020 11:26:37 [name], Arrionna L. ([id number]) -------------------------------------------------------------------------------- Patient/Caregiver Education Details Patient Name: Bobier, Kylyn L. Date of Service: 08/28/2020 11:15 AM Medical Record Number: [id number] Patient Account Number: [id number] Date of Birth/Gender: 11/13/1926 (87 y.o. F) Treating  RN: Donnamarie Poag Primary Care Physician: [name] Other Clinician: Referring Physician: [name] Treating Physician/Extender: Skipper Cliche in Treatment: 9 Education Assessment Education Provided To: Patient and Caregiver Education Topics Provided Basic Hygiene: Wound/Skin Impairment: Electronic Signature(s) Signed: 08/28/2020 12:48:21 PM By: Donnamarie Poag Entered ByDonnamarie Poag on 08/28/2020 11:55:59 Mendolia, Nimra L. ([id number]) -------------------------------------------------------------------------------- Wound Assessment Details Patient Name: Sackrider, Torry L. Date of Service: 08/28/2020 11:15 AM Medical Record Number: [id number] Patient Account Number: [id number] Date of Birth/Sex: [birth year] (87 y.o. F) Treating RN: Donnamarie Poag Primary Care Shyhiem Beeney: [name] Other Clinician: Referring Abhishek Levesque: [name] Treating Charlese Gruetzmacher/Extender: Skipper Cliche in Treatment: 9 Wound Status Wound Number: 2 Primary Pressure Ulcer Etiology: Wound Location: Left, Medial Calcaneus Wound Open Wounding Event: Pressure Injury Status: Date Acquired: 04/11/2020 Comorbid Cataracts, Anemia, Coronary Artery Disease, Weeks Of Treatment: 9 History: Hypertension, Myocardial Infarction, End Stage Renal Clustered Wound: No Disease, Gout, Osteoarthritis, Dementia Photos Wound Measurements Length: (cm) 0.8 Width: (cm) 0.8 Depth: (cm) 0.5 Area: (cm) 0.503 Volume: (cm) 0.251 % Reduction in Area: 57.3% % Reduction in Volume: 46.7% Epithelialization: None Tunneling: No Undermining: No Wound Description Classification: Category/Stage III Exudate Amount: Medium Exudate Type: Serosanguineous Exudate Color: red, brown Foul Odor After Cleansing: No Slough/Fibrino Yes Wound Bed Granulation Amount: Medium (34-66%) Exposed Structure Granulation Quality: Pink, Pale Fascia Exposed: No Necrotic Amount: Medium (34-66%) Fat Layer (Subcutaneous Tissue) Exposed:  Yes Necrotic Quality: Adherent Slough Tendon Exposed: No Muscle Exposed: No Joint Exposed:  No Bone Exposed: No Treatment Notes Wound #2 (Calcaneus) Wound Laterality: Left, Medial Cleanser Soap and Water Discharge Instruction: Gently cleanse wound with antibacterial soap, rinse and pat dry prior to dressing wounds Peri-Wound Care Baise, Bill L. ([id number]) Topical Primary Dressing Hydrofera Blue Ready Transfer Foam, 2.5x2.5 (in/in) Discharge Instruction: Apply Hydrofera Blue Ready to wound bed as directed Secondary Dressing ABD Pad 5x9 (in/in) Discharge Instruction: Cover with ABD pad. Secured With Compression Wrap Profore Lite LF 3 Multilayer Compression Bandaging System Discharge Instruction: Apply 3 multi-layer wrap as prescribed. Compression Stockings Add-Ons Electronic Signature(s) Signed: 08/28/2020 12:48:21 PM By: Donnamarie Poag Entered By: Donnamarie Poag on 08/28/2020 11:31:48 Maclin, Khloie L. ([id number]) -------------------------------------------------------------------------------- Vitals Details Patient Name: Uriegas, Seretha L. Date of Service: 08/28/2020 11:15 AM Medical Record Number: [id number] Patient Account Number: [id number] Date of Birth/Sex: Jul 08, 1926 (87 y.o. F) Treating RN: Donnamarie Poag Primary Care Woodie Trusty: [name] Other Clinician: Referring Welma Mccombs: [name] Treating Eula Jaster/Extender: Skipper Cliche in Treatment: 9 Vital Signs Time Taken: 11:25 Temperature (F): 98.6 Height (in): 58 Pulse (bpm): 90 Weight (lbs): 137 Respiratory Rate (breaths/min): 18 Body Mass Index (BMI): 28.6 Blood Pressure (mmHg): 101/63 Reference Range: 80 - 120 mg / dl Electronic Signature(s) Signed: 08/28/2020 12:48:21 PM By: Donnamarie Poag Entered ByDonnamarie Poag on 08/28/2020 11:26:29

## 2020-09-03 ENCOUNTER — Ambulatory Visit (INDEPENDENT_AMBULATORY_CARE_PROVIDER_SITE_OTHER): Payer: PPO | Admitting: Family Medicine

## 2020-09-03 ENCOUNTER — Other Ambulatory Visit: Payer: Self-pay

## 2020-09-03 ENCOUNTER — Encounter: Payer: Self-pay | Admitting: Family Medicine

## 2020-09-03 VITALS — BP 112/66 | HR 80 | Resp 20

## 2020-09-03 DIAGNOSIS — N184 Chronic kidney disease, stage 4 (severe): Secondary | ICD-10-CM | POA: Diagnosis not present

## 2020-09-03 DIAGNOSIS — I10 Essential (primary) hypertension: Secondary | ICD-10-CM

## 2020-09-03 DIAGNOSIS — E039 Hypothyroidism, unspecified: Secondary | ICD-10-CM

## 2020-09-03 DIAGNOSIS — D509 Iron deficiency anemia, unspecified: Secondary | ICD-10-CM | POA: Diagnosis not present

## 2020-09-03 NOTE — Progress Notes (Signed)
Established patient visit   Patient: [name] [name]   DOB: [birth year]   87 y.o. Female  MRN: [id number] Visit Date: 09/03/2020  Today's healthcare provider: [name], MD   Chief Complaint  Patient presents with   Hypertension   Hypothyroidism   Anemia   Subjective    HPI  Hypertension, follow-up  BP Readings from Last 3 Encounters:  09/03/20 112/66  05/28/20 117/64  04/02/20 (!) 92/44   Wt Readings from Last 3 Encounters:  05/28/20 137 lb (62.1 kg)  03/27/20 138 lb (62.6 kg)  02/27/20 141 lb (64 kg)     She was last seen for hypertension 12 weeks ago.  BP at that visit was 117/64. Management since that visit includes reduce amlodipine from '10mg'$  to 2.'5mg'$  daily and valsartan/hctz 320/'25mg'$  to 320/12.'5mg'$  daily.  She reports good compliance with treatment. However, patient reports that she is no longer taking amlodipine. She reports that BP readings were getting too low.l  She is not having side effects.  She is following a Regular diet. She is not exercising. She does not smoke.  Use of agents associated with hypertension: none.   Outside blood pressures are checked occasionally. Symptoms: No chest pain No chest pressure  No palpitations No syncope  No dyspnea No orthopnea  No paroxysmal nocturnal dyspnea No lower extremity edema   Pertinent labs: Lab Results  Component Value Date   CHOL 173 07/01/2019   HDL 53 07/01/2019   LDLCALC 104 (H) 07/01/2019   TRIG 84 07/01/2019   CHOLHDL 3.3 07/01/2019   Lab Results  Component Value Date   NA 136 04/02/2020   K 4.1 04/02/2020   CREATININE 1.24 (H) 04/02/2020   GFRNONAA 41 (L) 04/02/2020   GFRAA 40 (L) 01/10/2020   GLUCOSE 93 04/02/2020     The ASCVD Risk score (Goff DC Jr., et al., 2013) failed to calculate for the following reasons:   The 2013 ASCVD risk score is only valid for ages 6 to 71       Medications: Outpatient Medications Prior to Visit  Medication Sig   allopurinol (ZYLOPRIM)  100 MG tablet Take 2 tablets (200 mg total) by mouth daily.   Calcium Carb-Cholecalciferol (CALCIUM 600 + D PO) Take 1 tablet by mouth daily.   docusate sodium (COLACE) 100 MG capsule Take 1 capsule (100 mg total) by mouth 2 (two) times daily. Hold if having loose or frequent BM's.   ferrous sulfate 325 (65 FE) MG tablet Take 325 mg by mouth daily with breakfast.   levothyroxine (SYNTHROID) 88 MCG tablet Take 1 tablet (88 mcg total) by mouth daily before breakfast.   Multiple Vitamins-Minerals (PRESERVISION AREDS 2 PO) Take 1 tablet by mouth 2 (two) times daily.   omeprazole (PRILOSEC) 20 MG capsule TAKE 1 CAPSULE BY MOUTH DAILY   valsartan-hydrochlorothiazide (DIOVAN-HCT) 320-12.5 MG tablet Take 1 tablet by mouth daily.   alum & mag hydroxide-simeth (MAALOX/MYLANTA) 200-200-20 MG/5ML suspension Take 30 mLs by mouth every 4 (four) hours as needed for indigestion. (Patient not taking: Reported on 05/28/2020)   bisacodyl (DULCOLAX) 10 MG suppository Place 1 suppository (10 mg total) rectally daily as needed for moderate constipation. (Patient not taking: No sig reported)   clotrimazole (CLOTRIMAZOLE ANTI-FUNGAL) 1 % cream Apply 1 application topically 2 (two) times daily. (Patient not taking: No sig reported)   feeding supplement (ENSURE ENLIVE / ENSURE PLUS) LIQD Take 237 mLs by mouth 2 (two) times daily between meals. (Patient not taking: No  sig reported)   magnesium citrate SOLN Take 296 mLs (1 Bottle total) by mouth once as needed for severe constipation. (Patient not taking: No sig reported)   methocarbamol (ROBAXIN) 500 MG tablet Take 1 tablet (500 mg total) by mouth every 8 (eight) hours as needed for muscle spasms. (Patient not taking: No sig reported)   naphazoline-pheniramine (NAPHCON-A) 0.025-0.3 % ophthalmic solution Place 2 drops into the right eye 4 (four) times daily as needed for eye irritation. (Patient not taking: No sig reported)   ondansetron (ZOFRAN) 4 MG tablet Take 1 tablet (4 mg  total) by mouth every 6 (six) hours as needed for nausea. (Patient not taking: No sig reported)   polyethylene glycol (MIRALAX / GLYCOLAX) 17 g packet Take 17 g by mouth daily as needed for mild constipation. (Patient not taking: No sig reported)   [DISCONTINUED] amLODipine (NORVASC) 2.5 MG tablet TAKE ONE TABLET BY MOUTH EVERY EVENING (Patient not taking: Reported on 09/03/2020)   No facility-administered medications prior to visit.        Objective    BP 112/66   Pulse 80   Resp 20   SpO2 98%     Physical Exam  General appearance:  Overweight female, cooperative and in no acute distress Head: Normocephalic, without obvious abnormality, atraumatic Respiratory: Respirations even and unlabored, normal respiratory rate Extremities: All extremities are intact.  Skin: Skin color, texture, turgor normal. No rashes seen  Psych: Appropriate mood and affect. Neurologic: Mental status: Alert, oriented to person, place, and time, thought content appropriate.    Assessment & Plan     1. Primary hypertension Now off of amlodipine with SBP remaining in the 100s and 110s. Continue current medications.  Consider reducing valsartan-hctz with next refill.   2. Hypothyroidism, unspecified type  - TSH - T4, free  3. CKD (chronic kidney disease) stage 4, GFR 15-29 ml/min (HCC)  - Renal function panel  4. Iron deficiency anemia, unspecified iron deficiency anemia type On daily iron supplement.  - Iron, TIBC and Ferritin Panel - CBC      The entirety of the information documented in the History of Present Illness, Review of Systems and Physical Exam were personally obtained by me. Portions of this information were initially documented by the CMA and reviewed by me for thoroughness and accuracy.     [name], MD  [facility] [phone] (phone) [phone] (fax)  [place]

## 2020-09-04 ENCOUNTER — Encounter: Payer: PPO | Admitting: Physician Assistant

## 2020-09-04 DIAGNOSIS — L89623 Pressure ulcer of left heel, stage 3: Secondary | ICD-10-CM | POA: Diagnosis not present

## 2020-09-04 NOTE — Progress Notes (Addendum)
Beck, Quintasha L. ([id number]) Visit Report for 09/04/2020 Chief Complaint Document Details Patient Name: [name] [name], [name] Date of Service: 09/04/2020 11:15 AM Medical Record Number: [id number] Patient Account Number: [id number] Date of Birth/Sex: [birth year] (87 y.o. F) Treating RN: Dolan Amen Primary Care Provider: [name] Other Clinician: Referring Provider: [name] Treating Provider/Extender: Skipper Cliche in Treatment: 10 Information Obtained from: Patient Chief Complaint Left Heel Pressure Ulcer Electronic Signature(s) Signed: 09/04/2020 11:22:45 AM By: Worthy Keeler PA-C Entered By: Worthy Keeler on 09/04/2020 11:22:45 Sterling, Sarha L. ([id number]) -------------------------------------------------------------------------------- HPI Details Patient Name: [name], [name] Date of Service: 09/04/2020 11:15 AM Medical Record Number: [id number] Patient Account Number: [id number] Date of Birth/Sex: April 20, 1926 (87 y.o. F) Treating RN: Dolan Amen Primary Care Provider: [name] Other Clinician: Referring Provider: [name] Treating Provider/Extender: Skipper Cliche in Treatment: 10 History of Present Illness HPI Description: 88 year old patient was recently seen by the PCPs office for significant pain right great toe which has been going on since July. She was initially treated with Keflex which she did not complete and after the office visit this time she has been put on doxycycline. at the time of her visit she was found to have a ulcer on the plantar surface of the right great toe and also had a pyogenic granuloma over this area. X-ray of the right foot done 12/29/2014 -- IMPRESSION:Soft-tissue swelling and ulceration right great toe. No underlying bony lytic lesion identified. If osteomyelitis remains of clinical concern MRI can be obtained. Past medical history significant for anemia, chronic kidney disease stage III, obesity, varicose  veins, coronary artery disease, gout, history of nicotine addiction given up smoking in 2002, hypertension, status post cardiac catheterization, status post abdominal hysterectomy, cholecystectomy and tonsillectomy. hemoglobin A1c done in August was 5.8 01/22/2015 -- at this stage the [facility] Walking boat was going to cost them significant amount of money and they want to defer using that at the present time. 01/29/2015 -- she had a podiatry appointment and they have trimmed her toenails. She has not heard back from the vascular office regarding her venous duplex study and I have asked them to call personally so that they can get the appointment soon. 02/06/2015 -- they have made contact with the vascular office and from what I understand that test has been done but the report is pending. 02/19/2014 -- the vascular test is scheduled for tomorrow Readmission: 06/26/2020 upon evaluation today patient appears for initial evaluation in her clinic that she has been here before in 2016 and has been quite sometime. She did have a fractured hip in February on the 23rd 2022. Subsequently this had to be pinned and she ended up with a pressure injury on her heel following when she was using her foot to help move her around in the bed. Subsequently this has led to the wound that she has been dealing with since that time. She lives at home with her daughter currently she does have dementia. 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 07/03/2020 upon evaluation today patient appears to be doing well with regard to her heel ulcer. She did have arterial studies they appear to be doing excellent it was premature normal across the board with TBI's in the 90s and ABIs well within normal range. Nonetheless there does not appear to be any signs of arterial insufficiency whatsoever. With that  being said  the patient does have also signs of improvement there is some necrotic tissue in the base of the wound number to try to clear some of that away today I do believe the Iodoflex/Iodosorb is doing well. 5/24; difficult punched out wound on the left medial heel. She has been using Iodoflex 07/17/2020 upon evaluation today patient appears to be doing well at this point in regard to her wound. I do feel like this is a little bit deeper but again that is what is expected as we continue with the Iodoflex I think that this is just going to get deeper until we get to the base of the wound. With that being said I think we are getting much closer to the base of the wound where we can have healthier tissue that we get a be managing here which that will be awesome. In the meantime I am not surprised by what I am seeing and in fact the wound appears to be better as compared to previous findings. 07/24/2020 upon evaluation today patient appears to be doing well with regard to her wound. Overall I am extremely pleased with where things stand today. I do not see any signs of active infection which is great and overall I think that the patient is making good progress. There is good to be some need for sharp debridement today. 07/31/2020 upon evaluation today patient appears to be doing better in regard to her heel ulcer. She has been tolerating the dressing changes without complication. Fortunately there does not appear to be any signs of active infection which is great and overall very pleased in this regard. No fevers, chills, nausea, vomiting, or diarrhea. 08/14/2020 upon evaluation today patient appears to be doing better in regard to her heel ulcer. I am very pleased in that regard. Unfortunately she still has a slight deep tissue injury in regard to the medial portion of her foot over the same area. Unfortunately I think this is something that if were not careful it was can open up into the wound. That is  my main concern here based on what I see. Fortunately there does not appear to be any evidence of active infection which is great news and overall very pleased with where things stand at this point. 08/21/2020 upon evaluation today patient appears to be doing well with regard to her wound. She has been tolerating the dressing changes without complication. Fortunately there does not appear to be any signs of active infection at this time. No fevers, chills, nausea, vomiting, or diarrhea. I do believe the compression wrap was beneficial for her. 08/28/2020 upon evaluation today patient appears to be doing well with regard to her wounds currently. Fortunately there does not appear to be any signs of active infection at this time. No fevers, chills, nausea, vomiting, or diarrhea. With that being said I think that her leg is doing quite well to be honest. 09/04/2020 upon evaluation today patient appears to be doing well with regard to her wound on the heel. This is showing signs of good epithelial growth although there is probably can it be an indention where this heals that is okay as long as we get it closed. Fortunately I do not see any evidence of infection at this point. Case, Vayla L. ([id number]) Electronic Signature(s) Signed: 09/04/2020 5:48:47 PM By: Worthy Keeler PA-C Entered By: Worthy Keeler on 09/04/2020 17:48:47 Robison, Capri L. ([id number]) -------------------------------------------------------------------------------- Physical Exam Details Patient Name: Bevins, Jennefer L. Date of Service: 09/04/2020 11:15  AM Medical Record Number: [id number] Patient Account Number: [id number] Date of Birth/Sex: 1926-05-30 (87 y.o. F) Treating RN: Dolan Amen Primary Care Provider: [name] Other Clinician: Referring Provider: [name] Treating Provider/Extender: Skipper Cliche in Treatment: 6 Constitutional Well-nourished and well-hydrated in no acute  distress. Respiratory normal breathing without difficulty. Psychiatric this patient is able to make decisions and demonstrates good insight into disease process. Alert and Oriented x 3. pleasant and cooperative. Notes Upon inspection patient's wound again showed some signs of slough I was able to clean this away with saline and gauze no sharp debridement necessary at this point. Overall she tolerated this quite well and I was very pleased. In general she still has pain but is not nearly as bad as its been in the past. Electronic Signature(s) Signed: 09/04/2020 5:49:05 PM By: Worthy Keeler PA-C Entered By: Worthy Keeler on 09/04/2020 17:49:05 Odland, Rihanna L. ([id number]) -------------------------------------------------------------------------------- Physician Orders Details Patient Name: Blaszczyk, Elleni L. Date of Service: 09/04/2020 11:15 AM Medical Record Number: [id number] Patient Account Number: [id number] Date of Birth/Sex: [birth year] (87 y.o. F) Treating RN: Donnamarie Poag Primary Care Provider: [name] Other Clinician: Referring Provider: [name] Treating Provider/Extender: Skipper Cliche in Treatment: 10 Verbal / Phone Orders: No Diagnosis Coding ICD-10 Coding Code Description [phone] Pressure ulcer of left heel, stage 3 I10 Essential (primary) hypertension F01.50 Vascular dementia without behavioral disturbance I25.10 Atherosclerotic heart disease of native coronary artery without angina pectoris Follow-up Appointments o Return Appointment in 1 week. o Nurse Visit as needed Bathing/ Shower/ Hygiene o May shower; gently cleanse wound with antibacterial soap, rinse and pat dry prior to dressing wounds Non-Wound Condition o Additional non-wound orders/instructions: - Pad dorsal foot with remaining HYDROFERA on top for protection Use foam pad and betadine over pressure area on top medial foot Off-Loading o Other: - Offloading boots while in bed;  Try pillow under sheet to keep it in place. Wound Treatment Wound #2 - Calcaneus Wound Laterality: Left, Medial Cleanser: Soap and Water 1 x Per Week/30 Days Discharge Instructions: Gently cleanse wound with antibacterial soap, rinse and pat dry prior to dressing wounds Primary Dressing: Hydrofera Blue Ready Transfer Foam, 2.5x2.5 (in/in) 1 x Per Week/30 Days Discharge Instructions: Apply Hydrofera Blue Ready to wound bed as directed Secondary Dressing: ABD Pad 5x9 (in/in) 1 x Per Week/30 Days Discharge Instructions: Cover with ABD pad. Compression Wrap: Profore Lite LF 3 Multilayer Compression Bandaging System 1 x Per Week/30 Days Discharge Instructions: Apply 3 multi-layer wrap as prescribed. Electronic Signature(s) Signed: 09/04/2020 4:36:48 PM By: Donnamarie Poag Signed: 09/04/2020 6:10:04 PM By: Worthy Keeler PA-C Entered By: Donnamarie Poag on 09/04/2020 11:56:47 Lyons, Lancaster ([id number]) -------------------------------------------------------------------------------- Problem List Details Patient Name: Legrand, Imanii L. Date of Service: 09/04/2020 11:15 AM Medical Record Number: [id number] Patient Account Number: [id number] Date of Birth/Sex: 1926-07-18 (87 y.o. F) Treating RN: Dolan Amen Primary Care Provider: [name] Other Clinician: Referring Provider: [name] Treating Provider/Extender: Skipper Cliche in Treatment: 10 Active Problems ICD-10 Encounter Code Description Active Date MDM Diagnosis [phone] Pressure ulcer of left heel, stage 3 06/26/2020 No Yes I10 Essential (primary) hypertension 06/26/2020 No Yes F01.50 Vascular dementia without behavioral disturbance 06/26/2020 No Yes I25.10 Atherosclerotic heart disease of native coronary artery without angina 06/26/2020 No Yes pectoris Inactive Problems Resolved Problems Electronic Signature(s) Signed: 09/04/2020 11:22:39 AM By: Worthy Keeler PA-C Entered By: Worthy Keeler on 09/04/2020 11:22:39 Dulworth,  Rosely L. ([id number]) -------------------------------------------------------------------------------- Progress Note Details Patient Name: ANNELEISE, STELMA  L. Date of Service: 09/04/2020 11:15 AM Medical Record Number: [id number] Patient Account Number: [id number] Date of Birth/Sex: 12-25-1926 (87 y.o. F) Treating RN: Dolan Amen Primary Care Provider: [name] Other Clinician: Referring Provider: [name] Treating Provider/Extender: Skipper Cliche in Treatment: 10 Subjective Chief Complaint Information obtained from Patient Left Heel Pressure Ulcer History of Present Illness (HPI) 87 year old patient was recently seen by the PCPs office for significant pain right great toe which has been going on since July. She was initially treated with Keflex which she did not complete and after the office visit this time she has been put on doxycycline. at the time of her visit she was found to have a ulcer on the plantar surface of the right great toe and also had a pyogenic granuloma over this area. X-ray of the right foot done 12/29/2014 -- IMPRESSION:Soft-tissue swelling and ulceration right great toe. No underlying bony lytic lesion identified. If osteomyelitis remains of clinical concern MRI can be obtained. Past medical history significant for anemia, chronic kidney disease stage III, obesity, varicose veins, coronary artery disease, gout, history of nicotine addiction given up smoking in 2002, hypertension, status post cardiac catheterization, status post abdominal hysterectomy, cholecystectomy and tonsillectomy. hemoglobin A1c done in August was 5.8 01/22/2015 -- at this stage the [facility] Walking boat was going to cost them significant amount of money and they want to defer using that at the present time. 01/29/2015 -- she had a podiatry appointment and they have trimmed her toenails. She has not heard back from the vascular office regarding her venous duplex study and I have  asked them to call personally so that they can get the appointment soon. 02/06/2015 -- they have made contact with the vascular office and from what I understand that test has been done but the report is pending. 02/19/2014 -- the vascular test is scheduled for tomorrow Readmission: 06/26/2020 upon evaluation today patient appears for initial evaluation in her clinic that she has been here before in 2016 and has been quite sometime. She did have a fractured hip in February on the 23rd 2022. Subsequently this had to be pinned and she ended up with a pressure injury on her heel following when she was using her foot to help move her around in the bed. Subsequently this has led to the wound that she has been dealing with since that time. She lives at home with her daughter currently she does have dementia. 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 07/03/2020 upon evaluation today patient appears to be doing well with regard to her heel ulcer. She did have arterial studies they appear to be doing excellent it was premature normal across the board with TBI's in the 90s and ABIs well within normal range. Nonetheless there does not appear to be any signs of arterial insufficiency whatsoever. With that being said the patient does have also signs of improvement there is some necrotic tissue in the base of the wound number to try to clear some of that away today I do believe the Iodoflex/Iodosorb is doing well. 5/24; difficult punched out wound on the left medial heel. She has been using Iodoflex 07/17/2020 upon evaluation today patient appears to be doing well at this point in regard to her wound. I do feel like this is a little bit deeper but again that is what is expected as we continue with the  Iodoflex I think that this is just going to get deeper until we get to the base of  the wound. With that being said I think we are getting much closer to the base of the wound where we can have healthier tissue that we get a be managing here which that will be awesome. In the meantime I am not surprised by what I am seeing and in fact the wound appears to be better as compared to previous findings. 07/24/2020 upon evaluation today patient appears to be doing well with regard to her wound. Overall I am extremely pleased with where things stand today. I do not see any signs of active infection which is great and overall I think that the patient is making good progress. There is good to be some need for sharp debridement today. 07/31/2020 upon evaluation today patient appears to be doing better in regard to her heel ulcer. She has been tolerating the dressing changes without complication. Fortunately there does not appear to be any signs of active infection which is great and overall very pleased in this regard. No fevers, chills, nausea, vomiting, or diarrhea. 08/14/2020 upon evaluation today patient appears to be doing better in regard to her heel ulcer. I am very pleased in that regard. Unfortunately she still has a slight deep tissue injury in regard to the medial portion of her foot over the same area. Unfortunately I think this is something that if were not careful it was can open up into the wound. That is my main concern here based on what I see. Fortunately there does not appear to be any evidence of active infection which is great news and overall very pleased with where things stand at this point. 08/21/2020 upon evaluation today patient appears to be doing well with regard to her wound. She has been tolerating the dressing changes without complication. Fortunately there does not appear to be any signs of active infection at this time. No fevers, chills, nausea, vomiting, or diarrhea. I do believe the compression wrap was beneficial for her. 08/28/2020 upon evaluation today  patient appears to be doing well with regard to her wounds currently. Fortunately there does not appear to be any signs of active infection at this time. No fevers, chills, nausea, vomiting, or diarrhea. With that being said I think that her leg is doing quite well to be honest. Lacey, Hayla L. ([id number]) 09/04/2020 upon evaluation today patient appears to be doing well with regard to her wound on the heel. This is showing signs of good epithelial growth although there is probably can it be an indention where this heals that is okay as long as we get it closed. Fortunately I do not see any evidence of infection at this point. Objective Constitutional Well-nourished and well-hydrated in no acute distress. Vitals Time Taken: 11:23 AM, Height: 58 in, Weight: 137 lbs, BMI: 28.6, Temperature: 98.2 F, Pulse: 78 bpm, Respiratory Rate: 16 breaths/min, Blood Pressure: 136/74 mmHg. Respiratory normal breathing without difficulty. Psychiatric this patient is able to make decisions and demonstrates good insight into disease process. Alert and Oriented x 3. pleasant and cooperative. General Notes: Upon inspection patient's wound again showed some signs of slough I was able to clean this away with saline and gauze no sharp debridement necessary at this point. Overall she tolerated this quite well and I was very pleased. In general she still has pain but is not nearly as bad as its been in the past. Integumentary (Hair, Skin) Wound #2  status is Open. Original cause of wound was Pressure Injury. The date acquired was: 04/11/2020. The wound has been in treatment 10 weeks. The wound is located on the Left,Medial Calcaneus. The wound measures 0.8cm length x 0.7cm width x 0.4cm depth; 0.44cm^2 area and 0.176cm^3 volume. There is Fat Layer (Subcutaneous Tissue) exposed. There is no tunneling or undermining noted. There is a medium amount of serosanguineous drainage noted. There is medium (34-66%) pink, pale  granulation within the wound bed. There is a medium (34-66%) amount of necrotic tissue within the wound bed including Adherent Slough. Other Condition(s) Patient presents with Suspected Deep Tissue Injury located on the Left Foot. Assessment Active Problems ICD-10 Pressure ulcer of left heel, stage 3 Essential (primary) hypertension Vascular dementia without behavioral disturbance Atherosclerotic heart disease of native coronary artery without angina pectoris Procedures Wound #2 Pre-procedure diagnosis of Wound #2 is a Pressure Ulcer located on the Left,Medial Calcaneus . There was a Three Layer Compression Therapy Procedure by Donnamarie Poag, RN. Post procedure Diagnosis Wound #2: Same as Pre-Procedure Plan Hallums, Azrael L. ([id number]) Follow-up Appointments: Return Appointment in 1 week. Nurse Visit as needed Bathing/ Shower/ Hygiene: May shower; gently cleanse wound with antibacterial soap, rinse and pat dry prior to dressing wounds Non-Wound Condition: Additional non-wound orders/instructions: - Pad dorsal foot with remaining HYDROFERA on top for protection Use foam pad and betadine over pressure area on top medial foot Off-Loading: Other: - Offloading boots while in bed; Try pillow under sheet to keep it in place. WOUND #2: - Calcaneus Wound Laterality: Left, Medial Cleanser: Soap and Water 1 x Per Week/30 Days Discharge Instructions: Gently cleanse wound with antibacterial soap, rinse and pat dry prior to dressing wounds Primary Dressing: Hydrofera Blue Ready Transfer Foam, 2.5x2.5 (in/in) 1 x Per Week/30 Days Discharge Instructions: Apply Hydrofera Blue Ready to wound bed as directed Secondary Dressing: ABD Pad 5x9 (in/in) 1 x Per Week/30 Days Discharge Instructions: Cover with ABD pad. Compression Wrap: Profore Lite LF 3 Multilayer Compression Bandaging System 1 x Per Week/30 Days Discharge Instructions: Apply 3 multi-layer wrap as prescribed. 1. I would recommend  currently that we going to continue with the wound care measures as before and the patient is in agreement with plan. This includes the use of the [facility] dressing followed by an ABD pad. 2. Also can recommend we continue with a 3 layer compression wrap which is doing a great job. 3. I am also can recommend continued elevation to help with edema control. We will see patient back for reevaluation in 1 week here in the clinic. If anything worsens or changes patient will contact our office for additional recommendations. Electronic Signature(s) Signed: 09/04/2020 5:49:29 PM By: Worthy Keeler PA-C Entered By: Worthy Keeler on 09/04/2020 17:49:29 Rickels, Keianna L. ([id number]) -------------------------------------------------------------------------------- SuperBill Details Patient Name: Wyeth, Salah L. Date of Service: 09/04/2020 Medical Record Number: [id number] Patient Account Number: [id number] Date of Birth/Sex: 1926-04-04 (87 y.o. F) Treating RN: Donnamarie Poag Primary Care Provider: [name] Other Clinician: Referring Provider: [name] Treating Provider/Extender: Skipper Cliche in Treatment: 10 Diagnosis Coding ICD-10 Codes Code Description [phone] Pressure ulcer of left heel, stage 3 I10 Essential (primary) hypertension F01.50 Vascular dementia without behavioral disturbance I25.10 Atherosclerotic heart disease of native coronary artery without angina pectoris Facility Procedures CPT4 Code: [id number] Description: (Facility Use Only) [phone] - APPLY MULTLAY COMPRS LWR LT LEG Modifier: Quantity: 1 Physician Procedures CPT4 Code: [id number] Description: 99213 - WC PHYS LEVEL 3 - EST PT Modifier: Quantity:  1 CPT4 Code: Description: ICD-10 Diagnosis Description L89.623 Pressure ulcer of left heel, stage 3 I10 Essential (primary) hypertension F01.50 Vascular dementia without behavioral disturbance I25.10 Atherosclerotic heart disease of native coronary artery  without  angina Modifier: pectoris Quantity: Electronic Signature(s) Signed: 09/04/2020 5:50:36 PM By: Worthy Keeler PA-C Previous Signature: 09/04/2020 4:36:48 PM Version By: Donnamarie Poag Entered By: Worthy Keeler on 09/04/2020 17:50:36

## 2020-09-04 NOTE — Progress Notes (Addendum)
Supan, Raia L. ([id number]) Visit Report for 09/04/2020 Arrival Information Details Patient Name: [name] [name], [name] Date of Service: 09/04/2020 11:15 AM Medical Record Number: [id number] Patient Account Number: [id number] Date of Birth/Sex: 1926/04/03 (87 y.o. F) Treating RN: Donnamarie Poag Primary Care Alistar Mcenery: [name] Other Clinician: Referring Karyssa Amaral: [name] Treating Abrian Hanover/Extender: Skipper Cliche in Treatment: 10 Visit Information History Since Last Visit Added or deleted any medications: Yes Patient Arrived: Wheel Chair Had a fall or experienced change in No Arrival Time: 11:15 activities of daily living that may affect Accompanied By: daughter risk of falls: Transfer Assistance: EasyPivot Patient Lift Hospitalized since last visit: No Patient Identification Verified: Yes Has Dressing in Place as Prescribed: Yes Secondary Verification Process Completed: Yes Has Compression in Place as Prescribed: Yes Patient Requires Transmission-Based No Pain Present Now: No Precautions: Patient Has Alerts: Yes Patient Alerts: NOT diabetic ABI R1.18 L1.25 06/27/20 Electronic Signature(s) Signed: 09/04/2020 11:41:00 AM By: Donnamarie Poag Entered ByDonnamarie Poag on 09/04/2020 11:22:16 [name] [name], [name] ([id number]) -------------------------------------------------------------------------------- Clinic Level of Care Assessment Details Patient Name: [name] [name], [name] Date of Service: 09/04/2020 11:15 AM Medical Record Number: [id number] Patient Account Number: [id number] Date of Birth/Sex: 1926-09-11 (87 y.o. F) Treating RN: Donnamarie Poag Primary Care Dewana Ammirati: [name] Other Clinician: Referring Jesicca Dipierro: [name] Treating Jourdyn Hasler/Extender: Skipper Cliche in Treatment: 10 Clinic Level of Care Assessment Items TOOL 1 Quantity Score []  - Use when EandM and Procedure is performed on INITIAL visit 0 ASSESSMENTS - Nursing Assessment / Reassessment []  -  General Physical Exam (combine w/ comprehensive assessment (listed just below) when performed on new 0 pt. evals) []  - 0 Comprehensive Assessment (HX, ROS, Risk Assessments, Wounds Hx, etc.) ASSESSMENTS - Wound and Skin Assessment / Reassessment []  - Dermatologic / Skin Assessment (not related to wound area) 0 ASSESSMENTS - Ostomy and/or Continence Assessment and Care []  - Incontinence Assessment and Management 0 []  - 0 Ostomy Care Assessment and Management (repouching, etc.) PROCESS - Coordination of Care []  - Simple Patient / Family Education for ongoing care 0 []  - 0 Complex (extensive) Patient / Family Education for ongoing care []  - 0 Staff obtains Programmer, systems, Records, Test Results / Process Orders []  - 0 Staff telephones HHA, Nursing Homes / Clarify orders / etc []  - 0 Routine Transfer to another Facility (non-emergent condition) []  - 0 Routine Hospital Admission (non-emergent condition) []  - 0 New Admissions / Biomedical engineer / Ordering NPWT, Apligraf, etc. []  - 0 Emergency Hospital Admission (emergent condition) PROCESS - Special Needs []  - Pediatric / Minor Patient Management 0 []  - 0 Isolation Patient Management []  - 0 Hearing / Language / Visual special needs []  - 0 Assessment of Community assistance (transportation, D/C planning, etc.) []  - 0 Additional assistance / Altered mentation []  - 0 Support Surface(s) Assessment (bed, cushion, seat, etc.) INTERVENTIONS - Miscellaneous []  - External ear exam 0 []  - 0 Patient Transfer (multiple staff / Civil Service fast streamer / Similar devices) []  - 0 Simple Staple / Suture removal (25 or less) []  - 0 Complex Staple / Suture removal (26 or more) []  - 0 Hypo/Hyperglycemic Management (do not check if billed separately) []  - 0 Ankle / Brachial Index (ABI) - do not check if billed separately Has the patient been seen at the hospital within the last three years: Yes Total Score: 0 Level Of Care: ____ [name] [name], [name]  ([id number]) Electronic Signature(s) Signed: 09/04/2020 4:36:48 PM By: Donnamarie Poag Entered By: Donnamarie Poag on 09/04/2020 11:56:55 [name] [name], [name] (  [id number]) -------------------------------------------------------------------------------- Compression Therapy Details Patient Name: [name], [name] Date of Service: 09/04/2020 11:15 AM Medical Record Number: [id number] Patient Account Number: [id number] Date of Birth/Sex: 20-Feb-1926 (87 y.o. F) Treating RN: Donnamarie Poag Primary Care Kinya Meine: [name] Other Clinician: Referring Lamyra Malcolm: [name] Treating Matthew Pais/Extender: Skipper Cliche in Treatment: 10 Compression Therapy Performed for Wound Assessment: Wound #2 Left,Medial Calcaneus Performed By: Clinician Donnamarie Poag, RN Compression Type: Three Layer Post Procedure Diagnosis Same as Pre-procedure Electronic Signature(s) Signed: 09/04/2020 4:36:48 PM By: Donnamarie Poag Entered By: Donnamarie Poag on 09/04/2020 11:54:57 [name] [name], [name] ([id number]) -------------------------------------------------------------------------------- Encounter Discharge Information Details Patient Name: [name], [name] Date of Service: 09/04/2020 11:15 AM Medical Record Number: [id number] Patient Account Number: [id number] Date of Birth/Sex: [birth year] (87 y.o. F) Treating RN: Donnamarie Poag Primary Care Illeana Edick: [name] Other Clinician: Referring Kerisha Goughnour: [name] Treating Ehab Humber/Extender: Skipper Cliche in Treatment: 10 Encounter Discharge Information Items Discharge Condition: Stable Ambulatory Status: Wheelchair Discharge Destination: Home Transportation: Private Auto Accompanied By: daughter Schedule Follow-up Appointment: Yes Clinical Summary of Care: Electronic Signature(s) Signed: 09/04/2020 4:36:48 PM By: Donnamarie Poag Entered By: Donnamarie Poag on 09/04/2020 12:06:43 [name] [name], [name]  ([id number]) -------------------------------------------------------------------------------- Lower Extremity Assessment Details Patient Name: [name] [name], [name] Date of Service: 09/04/2020 11:15 AM Medical Record Number: [id number] Patient Account Number: [id number] Date of Birth/Sex: 25-Nov-1926 (87 y.o. F) Treating RN: Donnamarie Poag Primary Care Amaro Mangold: [name] Other Clinician: Referring Karron Goens: [name] Treating Blaise Palladino/Extender: Skipper Cliche in Treatment: 10 Edema Assessment Assessed: Shirlyn Goltz: Yes] [Right: No] Edema: [Left: N] [Right: o] Calf Left: Right: Point of Measurement: 32 cm From Medial Instep 32 cm Ankle Left: Right: Point of Measurement: 12 cm From Medial Instep 21 cm Vascular Assessment Pulses: Dorsalis Pedis Palpable: [Left:Yes] Electronic Signature(s) Signed: 09/04/2020 11:41:00 AM By: Donnamarie Poag Entered ByDonnamarie Poag on 09/04/2020 11:30:46 Eckart, Kyrstyn L. ([id number]) -------------------------------------------------------------------------------- Multi Wound Chart Details Patient Name: [name] [name], [name] Date of Service: 09/04/2020 11:15 AM Medical Record Number: [id number] Patient Account Number: [id number] Date of Birth/Sex: [birth year] (87 y.o. F) Treating RN: Donnamarie Poag Primary Care Ignacio Lowder: [name] Other Clinician: Referring Man Effertz: [name] Treating Jarely Juncaj/Extender: Skipper Cliche in Treatment: 10 Vital Signs Height(in): 36 Pulse(bpm): 33 Weight(lbs): 137 Blood Pressure(mmHg): 136/74 Body Mass Index(BMI): 29 Temperature(F): 98.2 Respiratory Rate(breaths/min): 16 Photos: [N/A:N/A] Wound Location: Left, Medial Calcaneus N/A N/A Wounding Event: Pressure Injury N/A N/A Primary Etiology: Pressure Ulcer N/A N/A Comorbid History: Cataracts, Anemia, Coronary Artery N/A N/A Disease, Hypertension, Myocardial Infarction, End Stage Renal Disease, Gout, Osteoarthritis, Dementia Date Acquired: 04/11/2020 N/A  N/A Weeks of Treatment: 10 N/A N/A Wound Status: Open N/A N/A Measurements L x W x D (cm) 0.8x0.7x0.4 N/A N/A Area (cm) : 0.44 N/A N/A Volume (cm) : 0.176 N/A N/A % Reduction in Area: 62.60% N/A N/A % Reduction in Volume: 62.60% N/A N/A Classification: Category/Stage III N/A N/A Exudate Amount: Medium N/A N/A Exudate Type: Serosanguineous N/A N/A Exudate Color: red, brown N/A N/A Granulation Amount: Medium (34-66%) N/A N/A Granulation Quality: Pink, Pale N/A N/A Necrotic Amount: Medium (34-66%) N/A N/A Exposed Structures: Fat Layer (Subcutaneous Tissue): N/A N/A Yes Fascia: No Tendon: No Muscle: No Joint: No Bone: No Epithelialization: None N/A N/A Treatment Notes Electronic Signature(s) Signed: 09/04/2020 11:41:00 AM By: Donnamarie Poag Entered ByDonnamarie Poag on 09/04/2020 11:31:58 Liska, Cayleigh L. ([id number]) -------------------------------------------------------------------------------- [place] Details Patient Name: [name] [name], [name] Date of Service: 09/04/2020 11:15 AM Medical Record Number: [id number] Patient Account Number: [id number] Date of Birth/Sex: October 20, 1926 (  87 y.o. F) Treating RN: Donnamarie Poag Primary Care June Vacha: [name] Other Clinician: Referring Mekiah Cambridge: [name] Treating Avelardo Reesman/Extender: Skipper Cliche in Treatment: 10 Active Inactive Wound/Skin Impairment Nursing Diagnoses: Impaired tissue integrity Goals: Patient/caregiver will verbalize understanding of skin care regimen Date Initiated: 06/26/2020 Date Inactivated: 08/21/2020 Target Resolution Date: 06/26/2020 Goal Status: Met Ulcer/skin breakdown will have a volume reduction of 30% by week 4 Date Initiated: 06/26/2020 Date Inactivated: 08/21/2020 Target Resolution Date: 07/27/2020 Goal Status: Met Ulcer/skin breakdown will have a volume reduction of 50% by week 8 Date Initiated: 06/26/2020 Target Resolution Date: 08/26/2020 Goal Status: Active Ulcer/skin  breakdown will have a volume reduction of 80% by week 12 Date Initiated: 06/26/2020 Target Resolution Date: 09/26/2020 Goal Status: Active Ulcer/skin breakdown will heal within 14 weeks Date Initiated: 06/26/2020 Target Resolution Date: 10/27/2020 Goal Status: Active Interventions: Assess patient/caregiver ability to obtain necessary supplies Assess patient/caregiver ability to perform ulcer/skin care regimen upon admission and as needed Assess ulceration(s) every visit Provide education on ulcer and skin care Treatment Activities: Referred to DME Asiyah Pineau for dressing supplies : 06/26/2020 Skin care regimen initiated : 06/26/2020 Notes: Electronic Signature(s) Signed: 09/04/2020 11:41:00 AM By: Donnamarie Poag Entered ByDonnamarie Poag on 09/04/2020 11:31:29 [name] [name], [name] ([id number]) -------------------------------------------------------------------------------- Non-Wound Condition Assessment Details Patient Name: [name] [name], [name] Date of Service: 09/04/2020 11:15 AM Medical Record Number: [id number] Patient Account Number: [id number] Date of Birth/Sex: 11-Dec-1926 (87 y.o. F) Treating RN: Donnamarie Poag Primary Care Santosha Jividen: [name] Other Clinician: Referring Ramiel Forti: [name] Treating Jaslyne Beeck/Extender: Skipper Cliche in Treatment: 10 Non-Wound Condition: Condition: Suspected Deep Tissue Injury Location: Foot Side: Left Notes: Left medial foot Photos Electronic Signature(s) Signed: 09/04/2020 4:36:48 PM By: Donnamarie Poag Entered By: Donnamarie Poag on 09/04/2020 11:59:21 [name] [name], [name] [name] L. ([id number]) -------------------------------------------------------------------------------- Pain Assessment Details Patient Name: Essner, Rikayla L. Date of Service: 09/04/2020 11:15 AM Medical Record Number: [id number] Patient Account Number: [id number] Date of Birth/Sex: [birth year] (87 y.o. F) Treating RN: Donnamarie Poag Primary Care Aireana Ryland: [name] Other  Clinician: Referring Darcey Cardy: [name] Treating Kass Herberger/Extender: Skipper Cliche in Treatment: 10 Active Problems Location of Pain Severity and Description of Pain Patient Has Paino No Site Locations Rate the pain. Current Pain Level: 0 Pain Management and Medication Current Pain Management: Electronic Signature(s) Signed: 09/04/2020 11:41:00 AM By: Donnamarie Poag Entered By: Donnamarie Poag on 09/04/2020 11:22:47 [place]oy[place], Alohilani L. ([id number]) -------------------------------------------------------------------------------- Patient/Caregiver Education Details Patient Name: Mccurley, Kiara L. Date of Service: 09/04/2020 11:15 AM Medical Record Number: [id number] Patient Account Number: [id number] Date of Birth/Gender: Jan 08, 1927 (87 y.o. F) Treating RN: Donnamarie Poag Primary Care Physician: [name] Other Clinician: Referring Physician: [name] Treating Physician/Extender: Skipper Cliche in Treatment: 10 Education Assessment Education Provided To: Caregiver Education Topics Provided Wound/Skin Impairment: Electronic Signature(s) Signed: 09/04/2020 4:36:48 PM By: Donnamarie Poag Entered ByDonnamarie Poag on 09/04/2020 11:57:16 Hakeem, Dominga L. ([id number]) -------------------------------------------------------------------------------- Wound Assessment Details Patient Name: Lembcke, Caree L. Date of Service: 09/04/2020 11:15 AM Medical Record Number: [id number] Patient Account Number: [id number] Date of Birth/Sex: 05-May-1926 (87 y.o. F) Treating RN: Donnamarie Poag Primary Care Gabrielle Wakeland: [name] Other Clinician: Referring Dylin Ihnen: [name] Treating Casen Pryor/Extender: Skipper Cliche in Treatment: 10 Wound Status Wound Number: 2 Primary Pressure Ulcer Etiology: Wound Location: Left, Medial Calcaneus Wound Open Wounding Event: Pressure Injury Status: Date Acquired: 04/11/2020 Comorbid Cataracts, Anemia, Coronary Artery Disease, Weeks Of Treatment:  10 History: Hypertension, Myocardial Infarction, End Stage Renal Clustered Wound: No Disease, Gout, Osteoarthritis, Dementia Photos Wound Measurements Length: (cm) 0.8  Width: (cm) 0.7 Depth: (cm) 0.4 Area: (cm) 0.44 Volume: (cm) 0.176 % Reduction in Area: 62.6% % Reduction in Volume: 62.6% Epithelialization: None Tunneling: No Undermining: No Wound Description Classification: Category/Stage III Exudate Amount: Medium Exudate Type: Serosanguineous Exudate Color: red, brown Foul Odor After Cleansing: No Slough/Fibrino Yes Wound Bed Granulation Amount: Medium (34-66%) Exposed Structure Granulation Quality: Pink, Pale Fascia Exposed: No Necrotic Amount: Medium (34-66%) Fat Layer (Subcutaneous Tissue) Exposed: Yes Necrotic Quality: Adherent Slough Tendon Exposed: No Muscle Exposed: No Joint Exposed: No Bone Exposed: No Treatment Notes Wound #2 (Calcaneus) Wound Laterality: Left, Medial Cleanser Soap and Water Discharge Instruction: Gently cleanse wound with antibacterial soap, rinse and pat dry prior to dressing wounds Peri-Wound Care Mattera, Bentli L. ([id number]) Topical Primary Dressing Hydrofera Blue Ready Transfer Foam, 2.5x2.5 (in/in) Discharge Instruction: Apply Hydrofera Blue Ready to wound bed as directed Secondary Dressing ABD Pad 5x9 (in/in) Discharge Instruction: Cover with ABD pad. Secured With Compression Wrap Profore Lite LF 3 Multilayer Compression Bandaging System Discharge Instruction: Apply 3 multi-layer wrap as prescribed. Compression Stockings Add-Ons Electronic Signature(s) Signed: 09/04/2020 11:41:00 AM By: Donnamarie Poag Entered By: Donnamarie Poag on 09/04/2020 11:29:36 Charland, Harrington ([id number]) -------------------------------------------------------------------------------- Vitals Details Patient Name: Nay, Kalimah L. Date of Service: 09/04/2020 11:15 AM Medical Record Number: [id number] Patient Account Number: [id number] Date of  Birth/Sex: 10-05-1926 (87 y.o. F) Treating RN: Donnamarie Poag Primary Care Sapna Padron: [name] Other Clinician: Referring Candas Deemer: [name] Treating Corneilus Heggie/Extender: Skipper Cliche in Treatment: 10 Vital Signs Time Taken: 11:23 Temperature (F): 98.2 Height (in): 58 Pulse (bpm): 78 Weight (lbs): 137 Respiratory Rate (breaths/min): 16 Body Mass Index (BMI): 28.6 Blood Pressure (mmHg): 136/74 Reference Range: 80 - 120 mg / dl Electronic Signature(s) Signed: 09/04/2020 11:41:00 AM By: Donnamarie Poag Entered ByDonnamarie Poag on 09/04/2020 11:22:39

## 2020-09-10 DIAGNOSIS — N184 Chronic kidney disease, stage 4 (severe): Secondary | ICD-10-CM | POA: Diagnosis not present

## 2020-09-10 DIAGNOSIS — E039 Hypothyroidism, unspecified: Secondary | ICD-10-CM | POA: Diagnosis not present

## 2020-09-10 DIAGNOSIS — D509 Iron deficiency anemia, unspecified: Secondary | ICD-10-CM | POA: Diagnosis not present

## 2020-09-11 ENCOUNTER — Other Ambulatory Visit: Payer: Self-pay

## 2020-09-11 ENCOUNTER — Encounter: Payer: PPO | Admitting: Physician Assistant

## 2020-09-11 DIAGNOSIS — L89623 Pressure ulcer of left heel, stage 3: Secondary | ICD-10-CM | POA: Diagnosis not present

## 2020-09-11 LAB — CBC
Hematocrit: 34 % (ref 34.0–46.6)
Hemoglobin: 10.7 g/dL — ABNORMAL LOW (ref 11.1–15.9)
MCH: 26.8 pg (ref 26.6–33.0)
MCHC: 31.5 g/dL (ref 31.5–35.7)
MCV: 85 fL (ref 79–97)
Platelets: 192 10*3/uL (ref 150–450)
RBC: 3.99 x10E6/uL (ref 3.77–5.28)
RDW: 14.2 % (ref 11.7–15.4)
WBC: 7.7 10*3/uL (ref 3.4–10.8)

## 2020-09-11 LAB — RENAL FUNCTION PANEL
Albumin: 3.9 g/dL (ref 3.5–4.6)
BUN/Creatinine Ratio: 24 (ref 12–28)
BUN: 26 mg/dL (ref 10–36)
CO2: 23 mmol/L (ref 20–29)
Calcium: 9.7 mg/dL (ref 8.7–10.3)
Chloride: 104 mmol/L (ref 96–106)
Creatinine, Ser: 1.09 mg/dL — ABNORMAL HIGH (ref 0.57–1.00)
Glucose: 97 mg/dL (ref 65–99)
Phosphorus: 2.7 mg/dL — ABNORMAL LOW (ref 3.0–4.3)
Potassium: 4.2 mmol/L (ref 3.5–5.2)
Sodium: 142 mmol/L (ref 134–144)
eGFR: 48 mL/min/{1.73_m2} — ABNORMAL LOW (ref >59)

## 2020-09-11 LAB — IRON,TIBC AND FERRITIN PANEL
Ferritin: 85 ng/mL (ref 15–150)
Iron Saturation: 11 % — ABNORMAL LOW (ref 15–55)
Iron: 25 ug/dL — ABNORMAL LOW (ref 27–139)
Total Iron Binding Capacity: 221 ug/dL — ABNORMAL LOW (ref 250–450)
UIBC: 196 ug/dL (ref 118–369)

## 2020-09-11 LAB — T4, FREE: Free T4: 1.17 ng/dL (ref 0.82–1.77)

## 2020-09-11 LAB — TSH: TSH: 4.24 u[IU]/mL (ref 0.450–4.500)

## 2020-09-11 NOTE — Progress Notes (Addendum)
[name] [name], [name] ([id number]) Visit Report for 09/11/2020 Arrival Information Details Patient Name: [name] [name], [name] Date of Service: 09/11/2020 11:15 AM Medical Record Number: [id number] Patient Account Number: [id number] Date of Birth/Sex: May 11, 1926 (87 y.o. F) Treating RN: Donnamarie Poag Primary Care Anniebelle Devore: [name] Other Clinician: Referring Marcio Hoque: [name] Treating Shavell Nored/Extender: Skipper Cliche in Treatment: 11 Visit Information History Since Last Visit Added or deleted any medications: No Patient Arrived: Wheel Chair Had a fall or experienced change in No Arrival Time: 11:11 activities of daily living that may affect Accompanied By: daughter risk of falls: Transfer Assistance: EasyPivot Patient Lift Hospitalized since last visit: No Patient Identification Verified: Yes Has Dressing in Place as Prescribed: Yes Secondary Verification Process Completed: Yes Has Compression in Place as Prescribed: Yes Patient Requires Transmission-Based No Pain Present Now: No Precautions: Patient Has Alerts: Yes Patient Alerts: NOT diabetic ABI R1.18 L1.25 06/27/20 Electronic Signature(s) Signed: 09/11/2020 4:05:30 PM By: Donnamarie Poag Entered ByDonnamarie Poag on 09/11/2020 11:12:01 [name] [name], [name] ([id number]) -------------------------------------------------------------------------------- Clinic Level of Care Assessment Details Patient Name: Puffenbarger, Madeleine L. Date of Service: 09/11/2020 11:15 AM Medical Record Number: [id number] Patient Account Number: [id number] Date of Birth/Sex: 25-Mar-1926 (87 y.o. F) Treating RN: Donnamarie Poag Primary Care Danajah Birdsell: [name] Other Clinician: Referring Johara Lodwick: [name] Treating Coltin Casher/Extender: Skipper Cliche in Treatment: 11 Clinic Level of Care Assessment Items TOOL 1 Quantity Score []  - Use when EandM and Procedure is performed on INITIAL visit 0 ASSESSMENTS - Nursing Assessment / Reassessment []  -  General Physical Exam (combine w/ comprehensive assessment (listed just below) when performed on new 0 pt. evals) []  - 0 Comprehensive Assessment (HX, ROS, Risk Assessments, Wounds Hx, etc.) ASSESSMENTS - Wound and Skin Assessment / Reassessment []  - Dermatologic / Skin Assessment (not related to wound area) 0 ASSESSMENTS - Ostomy and/or Continence Assessment and Care []  - Incontinence Assessment and Management 0 []  - 0 Ostomy Care Assessment and Management (repouching, etc.) PROCESS - Coordination of Care []  - Simple Patient / Family Education for ongoing care 0 []  - 0 Complex (extensive) Patient / Family Education for ongoing care []  - 0 Staff obtains Programmer, systems, Records, Test Results / Process Orders []  - 0 Staff telephones HHA, Nursing Homes / Clarify orders / etc []  - 0 Routine Transfer to another Facility (non-emergent condition) []  - 0 Routine Hospital Admission (non-emergent condition) []  - 0 New Admissions / Biomedical engineer / Ordering NPWT, Apligraf, etc. []  - 0 Emergency Hospital Admission (emergent condition) PROCESS - Special Needs []  - Pediatric / Minor Patient Management 0 []  - 0 Isolation Patient Management []  - 0 Hearing / Language / Visual special needs []  - 0 Assessment of Community assistance (transportation, D/C planning, etc.) []  - 0 Additional assistance / Altered mentation []  - 0 Support Surface(s) Assessment (bed, cushion, seat, etc.) INTERVENTIONS - Miscellaneous []  - External ear exam 0 []  - 0 Patient Transfer (multiple staff / Civil Service fast streamer / Similar devices) []  - 0 Simple Staple / Suture removal (25 or less) []  - 0 Complex Staple / Suture removal (26 or more) []  - 0 Hypo/Hyperglycemic Management (do not check if billed separately) []  - 0 Ankle / Brachial Index (ABI) - do not check if billed separately Has the patient been seen at the hospital within the last three years: Yes Total Score: 0 Level Of Care: ____ [name] [name], [name]  ([id number]) Electronic Signature(s) Signed: 09/11/2020 4:05:30 PM By: Donnamarie Poag Entered By: Donnamarie Poag on 09/11/2020 12:05:09 [name] [name], [name] (  [id number]) -------------------------------------------------------------------------------- Compression Therapy Details Patient Name: Sailer, Garyn L. Date of Service: 09/11/2020 11:15 AM Medical Record Number: [id number] Patient Account Number: [id number] Date of Birth/Sex: Feb 02, 1927 (87 y.o. F) Treating RN: Donnamarie Poag Primary Care Butch Otterson: [name] Other Clinician: Referring Ovella Manygoats: [name] Treating Stpehen Petitjean/Extender: Skipper Cliche in Treatment: 11 Compression Therapy Performed for Wound Assessment: Wound #2 Left,Medial Calcaneus Performed By: Junius Argyle, RN Compression Type: Three Layer Post Procedure Diagnosis Same as Pre-procedure Electronic Signature(s) Signed: 09/11/2020 4:05:30 PM By: Donnamarie Poag Entered By: Donnamarie Poag on 09/11/2020 12:03:48 [name] [name], [name] [name] ([id number]) -------------------------------------------------------------------------------- Encounter Discharge Information Details Patient Name: Willmore, Janene L. Date of Service: 09/11/2020 11:15 AM Medical Record Number: [id number] Patient Account Number: [id number] Date of Birth/Sex: 1926-03-22 (87 y.o. F) Treating RN: Donnamarie Poag Primary Care Celia Gibbons: [name] Other Clinician: Referring Pihu Basil: [name] Treating Annielee Jemmott/Extender: Skipper Cliche in Treatment: 11 Encounter Discharge Information Items Post Procedure Vitals Discharge Condition: Stable Temperature (F): 98.3 Ambulatory Status: Wheelchair Pulse (bpm): 89 Discharge Destination: Home Respiratory Rate (breaths/min): 16 Transportation: Private Auto Blood Pressure (mmHg): 149/74 Accompanied By: daughter Schedule Follow-up Appointment: Yes Clinical Summary of Care: Electronic Signature(s) Signed: 09/11/2020 4:05:30 PM By: Donnamarie Poag Entered ByDonnamarie Poag on 09/11/2020 12:08:53 [name] [name], [name] ([id number]) -------------------------------------------------------------------------------- Lower Extremity Assessment Details Patient Name: Stahnke, Myrtis L. Date of Service: 09/11/2020 11:15 AM Medical Record Number: [id number] Patient Account Number: [id number] Date of Birth/Sex: 1926-06-11 (87 y.o. F) Treating RN: Donnamarie Poag Primary Care Evania Lyne: [name] Other Clinician: Referring Canon Gola: [name] Treating Deedra Pro/Extender: Skipper Cliche in Treatment: 11 Edema Assessment Assessed: Shirlyn Goltz: Yes] Patrice Paradise: No] [Left: Edema] [Right: :] Calf Left: Right: Point of Measurement: 32 cm From Medial Instep 32 cm Ankle Left: Right: Point of Measurement: 12 cm From Medial Instep 22 cm Vascular Assessment Pulses: Dorsalis Pedis Palpable: [Left:Yes] Electronic Signature(s) Signed: 09/11/2020 4:05:30 PM By: Donnamarie Poag Entered ByDonnamarie Poag on 09/11/2020 11:21:41 Balling, Jesyka L. ([id number]) -------------------------------------------------------------------------------- Multi Wound Chart Details Patient Name: Furness, Kanitra L. Date of Service: 09/11/2020 11:15 AM Medical Record Number: [id number] Patient Account Number: [id number] Date of Birth/Sex: [birth year] (87 y.o. F) Treating RN: Donnamarie Poag Primary Care Chamya Hunton: [name] Other Clinician: Referring Halina Asano: [name] Treating Senya Hinzman/Extender: Skipper Cliche in Treatment: 11 Vital Signs Height(in): 20 Pulse(bpm): 66 Weight(lbs): 137 Blood Pressure(mmHg): 149/74 Body Mass Index(BMI): 29 Temperature(F): 98.3 Respiratory Rate(breaths/min): 16 Photos: [N/A:N/A] Wound Location: Left, Medial Calcaneus N/A N/A Wounding Event: Pressure Injury N/A N/A Primary Etiology: Pressure Ulcer N/A N/A Comorbid History: Cataracts, Anemia, Coronary Artery N/A N/A Disease, Hypertension, Myocardial Infarction, End Stage Renal Disease, Gout,  Osteoarthritis, Dementia Date Acquired: 04/11/2020 N/A N/A Weeks of Treatment: 11 N/A N/A Wound Status: Open N/A N/A Measurements L x W x D (cm) 1x0.5x0.4 N/A N/A Area (cm) : 0.393 N/A N/A Volume (cm) : 0.157 N/A N/A % Reduction in Area: 66.60% N/A N/A % Reduction in Volume: 66.70% N/A N/A Classification: Category/Stage III N/A N/A Exudate Amount: Medium N/A N/A Exudate Type: Serosanguineous N/A N/A Exudate Color: red, brown N/A N/A Granulation Amount: Large (67-100%) N/A N/A Granulation Quality: Pink, Pale N/A N/A Necrotic Amount: Small (1-33%) N/A N/A Exposed Structures: Fat Layer (Subcutaneous Tissue): N/A N/A Yes Fascia: No Tendon: No Muscle: No Joint: No Bone: No Epithelialization: None N/A N/A Treatment Notes Electronic Signature(s) Signed: 09/11/2020 4:05:30 PM By: Donnamarie Poag Entered ByDonnamarie Poag on 09/11/2020 12:03:36 [name] [name], [name] [name] ([id number]) -------------------------------------------------------------------------------- [place] Details Patient Name: Everage, Gionni L. Date of  Service: 09/11/2020 11:15 AM Medical Record Number: [id number] Patient Account Number: [id number] Date of Birth/Sex: 1926/02/24 (87 y.o. F) Treating RN: Donnamarie Poag Primary Care Nattalie Santiesteban: [name] Other Clinician: Referring Merritt Kibby: [name] Treating Danesha Kirchoff/Extender: Skipper Cliche in Treatment: 11 Active Inactive Wound/Skin Impairment Nursing Diagnoses: Impaired tissue integrity Goals: Patient/caregiver will verbalize understanding of skin care regimen Date Initiated: 06/26/2020 Date Inactivated: 08/21/2020 Target Resolution Date: 06/26/2020 Goal Status: Met Ulcer/skin breakdown will have a volume reduction of 30% by week 4 Date Initiated: 06/26/2020 Date Inactivated: 08/21/2020 Target Resolution Date: 07/27/2020 Goal Status: Met Ulcer/skin breakdown will have a volume reduction of 50% by week 8 Date Initiated: 06/26/2020 Target Resolution  Date: 08/26/2020 Goal Status: Active Ulcer/skin breakdown will have a volume reduction of 80% by week 12 Date Initiated: 06/26/2020 Target Resolution Date: 09/26/2020 Goal Status: Active Ulcer/skin breakdown will heal within 14 weeks Date Initiated: 06/26/2020 Target Resolution Date: 10/27/2020 Goal Status: Active Interventions: Assess patient/caregiver ability to obtain necessary supplies Assess patient/caregiver ability to perform ulcer/skin care regimen upon admission and as needed Assess ulceration(s) every visit Provide education on ulcer and skin care Treatment Activities: Referred to DME Simone Tuckey for dressing supplies : 06/26/2020 Skin care regimen initiated : 06/26/2020 Notes: Electronic Signature(s) Signed: 09/11/2020 4:05:30 PM By: Donnamarie Poag Entered ByDonnamarie Poag on 09/11/2020 12:03:23 [name] [name], [name] ([id number]) -------------------------------------------------------------------------------- Pain Assessment Details Patient Name: [name] [name], [name] Date of Service: 09/11/2020 11:15 AM Medical Record Number: [id number] Patient Account Number: [id number] Date of Birth/Sex: 03/17/1926 (87 y.o. F) Treating RN: Donnamarie Poag Primary Care Fredrico Beedle: [name] Other Clinician: Referring Latash Nouri: [name] Treating Daxson Reffett/Extender: Skipper Cliche in Treatment: 11 Active Problems Location of Pain Severity and Description of Pain Patient Has Paino No Site Locations Rate the pain. Current Pain Level: 0 Pain Management and Medication Current Pain Management: Electronic Signature(s) Signed: 09/11/2020 4:05:30 PM By: Donnamarie Poag Entered By: Donnamarie Poag on 09/11/2020 11:14:11 [name] [name], [name] ([id number]) -------------------------------------------------------------------------------- Patient/Caregiver Education Details Patient Name: [name] [name], [name] Date of Service: 09/11/2020 11:15 AM Medical Record Number: [id number] Patient Account Number: [id number] Date of  Birth/Gender: 17-Oct-1926 (87 y.o. F) Treating RN: Donnamarie Poag Primary Care Physician: [name] Other Clinician: Referring Physician: [name] Treating Physician/Extender: Skipper Cliche in Treatment: 11 Education Assessment Education Provided To: Caregiver Education Topics Provided Basic Hygiene: Wound/Skin Impairment: Electronic Signature(s) Signed: 09/11/2020 4:05:30 PM By: Donnamarie Poag Entered ByDonnamarie Poag on 09/11/2020 12:08:08 [name] [name], [name] ([id number]) -------------------------------------------------------------------------------- Wound Assessment Details Patient Name: [name] [name], Marien L. Date of Service: 09/11/2020 11:15 AM Medical Record Number: [id number] Patient Account Number: [id number] Date of Birth/Sex: [birth year] (87 y.o. F) Treating RN: Donnamarie Poag Primary Care Mariona Scholes: [name] Other Clinician: Referring Sharronda Schweers: [name] Treating Yechiel Erny/Extender: Skipper Cliche in Treatment: 11 Wound Status Wound Number: 2 Primary Pressure Ulcer Etiology: Wound Location: Left, Medial Calcaneus Wound Open Wounding Event: Pressure Injury Status: Date Acquired: 04/11/2020 Comorbid Cataracts, Anemia, Coronary Artery Disease, Weeks Of Treatment: 11 History: Hypertension, Myocardial Infarction, End Stage Renal Clustered Wound: No Disease, Gout, Osteoarthritis, Dementia Photos Wound Measurements Length: (cm) 1 Width: (cm) 0.5 Depth: (cm) 0.4 Area: (cm) 0.393 Volume: (cm) 0.157 % Reduction in Area: 66.6% % Reduction in Volume: 66.7% Epithelialization: None Tunneling: No Undermining: No Wound Description Classification: Category/Stage III Exudate Amount: Medium Exudate Type: Serosanguineous Exudate Color: red, brown Foul Odor After Cleansing: No Slough/Fibrino Yes Wound Bed Granulation Amount: Large (67-100%) Exposed Structure Granulation Quality: Pink, Pale Fascia Exposed: No Necrotic Amount: Small (1-33%) Fat Layer  (Subcutaneous  Tissue) Exposed: Yes Necrotic Quality: Adherent Slough Tendon Exposed: No Muscle Exposed: No Joint Exposed: No Bone Exposed: No Treatment Notes Wound #2 (Calcaneus) Wound Laterality: Left, Medial Cleanser Soap and Water Discharge Instruction: Gently cleanse wound with antibacterial soap, rinse and pat dry prior to dressing wounds Peri-Wound Care Riggio, [place] ([id number]) Topical Primary Dressing Hydrofera Blue Ready Transfer Foam, 2.5x2.5 (in/in) Discharge Instruction: Apply Hydrofera Blue Ready to wound bed as directed Secondary Dressing ABD Pad 5x9 (in/in) Discharge Instruction: Cover with ABD pad. Secured With Compression Wrap Profore Lite LF 3 Multilayer Compression Bandaging System Discharge Instruction: Apply 3 multi-layer wrap as prescribed. Compression Stockings Add-Ons Electronic Signature(s) Signed: 09/11/2020 4:05:30 PM By: Donnamarie Poag Entered By: Donnamarie Poag on 09/11/2020 11:20:06 Postema, Lanita L. ([id number]) -------------------------------------------------------------------------------- Vitals Details Patient Name: Schouten, Shiana L. Date of Service: 09/11/2020 11:15 AM Medical Record Number: [id number] Patient Account Number: [id number] Date of Birth/Sex: [birth year] (87 y.o. F) Treating RN: Donnamarie Poag Primary Care Ayaansh Smail: [name] Other Clinician: Referring Ezekiel Menzer: [name] Treating Damir Leung/Extender: Skipper Cliche in Treatment: 11 Vital Signs Time Taken: 11:14 Temperature (F): 98.3 Height (in): 58 Pulse (bpm): 89 Weight (lbs): 137 Respiratory Rate (breaths/min): 16 Body Mass Index (BMI): 28.6 Blood Pressure (mmHg): 149/74 Reference Range: 80 - 120 mg / dl Electronic Signature(s) Signed: 09/11/2020 4:05:30 PM By: Donnamarie Poag Entered ByDonnamarie Poag on 09/11/2020 11:14:05

## 2020-09-12 NOTE — Progress Notes (Addendum)
[name] [name], [name] ([id number]) Visit Report for 09/11/2020 Chief Complaint Document Details Patient Name: [name] [name], [name] Date of Service: 09/11/2020 11:15 AM Medical Record Number: [id number] Patient Account Number: [id number] Date of Birth/Sex: 1926/09/30 (87 y.o. F) Treating RN: Dolan Amen Primary Care Provider: [name] Other Clinician: Referring Provider: [name] Treating Provider/Extender: Skipper Cliche in Treatment: 11 Information Obtained from: Patient Chief Complaint Left Heel Pressure Ulcer Electronic Signature(s) Signed: 09/13/2020 4:43:00 PM By: Worthy Keeler PA-C Entered By: Worthy Keeler on 09/13/2020 16:43:00 [name] [name], [name] ([id number]) -------------------------------------------------------------------------------- Debridement Details Patient Name: [name] [name], [name] Date of Service: 09/11/2020 11:15 AM Medical Record Number: [id number] Patient Account Number: [id number] Date of Birth/Sex: 1926-11-07 (87 y.o. F) Treating RN: Donnamarie Poag Primary Care Provider: [name] Other Clinician: Referring Provider: [name] Treating Provider/Extender: Skipper Cliche in Treatment: 11 Debridement Performed for Wound #2 Left,Medial Calcaneus Assessment: Performed By: Physician Tommie Sams., PA-C Debridement Type: Debridement Level of Consciousness (Pre- Awake and Alert procedure): Pre-procedure Verification/Time Out Yes - 12:05 Taken: Start Time: 12:05 Pain Control: Lidocaine Total Area Debrided (L x W): 1 (cm) x 0.2 (cm) = 0.2 (cm) Tissue and other material Non-Viable, Callus debrided: Level: Non-Viable Tissue Debridement Description: Selective/Open Wound Instrument: Curette Bleeding: Minimum Hemostasis Achieved: Pressure End Time: 12:08 Response to Treatment: Procedure was tolerated well Level of Consciousness (Post- Awake and Alert procedure): Post Debridement Measurements of Total Wound Length: (cm) 1 Stage:  Category/Stage III Width: (cm) 0.5 Depth: (cm) 0.4 Volume: (cm) 0.157 Character of Wound/Ulcer Post Debridement: Improved Post Procedure Diagnosis Same as Pre-procedure Electronic Signature(s) Signed: 09/11/2020 4:05:30 PM By: Donnamarie Poag Signed: 09/12/2020 5:04:06 PM By: Worthy Keeler PA-C Entered By: Donnamarie Poag on 09/11/2020 12:07:47 [name] [name], [name] ([id number]) -------------------------------------------------------------------------------- HPI Details Patient Name: [name] [name], [name] Date of Service: 09/11/2020 11:15 AM Medical Record Number: [id number] Patient Account Number: [id number] Date of Birth/Sex: 06/29/1926 (87 y.o. F) Treating RN: Dolan Amen Primary Care Provider: [name] Other Clinician: Referring Provider: [name] Treating Provider/Extender: Skipper Cliche in Treatment: 11 History of Present Illness HPI Description: 87 year old patient was recently seen by the PCPs office for significant pain right great toe which has been going on since July. She was initially treated with Keflex which she did not complete and after the office visit this time she has been put on doxycycline. [name] [name] the time of her visit she was found to have a ulcer on the plantar surface of the right great toe and also had a pyogenic granuloma over this area. X-ray of the right foot done 12/29/2014 -- IMPRESSION:Soft-tissue swelling and ulceration right great toe. No underlying bony lytic lesion identified. If osteomyelitis remains of clinical concern MRI can be obtained. Past medical history significant for anemia, chronic kidney disease stage III, obesity, varicose veins, coronary artery disease, gout, history of nicotine addiction given up smoking in 2002, hypertension, status post cardiac catheterization, status post abdominal hysterectomy, cholecystectomy and tonsillectomy. hemoglobin A1c done in August was 5.8 01/22/2015 -- [name] [name] this stage the [facility] Walking boat was going to cost  them significant amount of money and they want to defer using that [name] [name] the present time. 01/29/2015 -- she had a podiatry appointment and they have trimmed her toenails. She has not heard back from the vascular office regarding her venous duplex study and I have asked them to call personally so that they can get the appointment soon. 02/06/2015 -- they have made contact with the vascular office and from what I  understand that test has been done but the report is pending. 02/19/2014 -- the vascular test is scheduled for tomorrow Readmission: 06/26/2020 upon evaluation today patient appears for initial evaluation in her clinic that she has been here before in 2016 and has been quite sometime. She did have a fractured hip in February on the 23rd 2022. Subsequently this had to be pinned and she ended up with a pressure injury on her heel following when she was using her foot to help move her around in the bed. Subsequently this has led to the wound that she has been dealing with since that time. She lives [name] [name] home with her daughter currently she does have dementia. 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 07/03/2020 upon evaluation today patient appears to be doing well with regard to her heel ulcer. She did have arterial studies they appear to be doing excellent it was premature normal across the board with TBI's in the 90s and ABIs well within normal range. Nonetheless there does not appear to be any signs of arterial insufficiency whatsoever. With that being said the patient does have also signs of improvement there is some necrotic tissue in the base of the wound number to try to clear some of that away today I do believe the Iodoflex/Iodosorb is doing well. 5/24; difficult punched out wound on the left medial heel. She has been using Iodoflex 07/17/2020 upon evaluation  today patient appears to be doing well [name] [name] this point in regard to her wound. I do feel like this is a little bit deeper but again that is what is expected as we continue with the Iodoflex I think that this is just going to get deeper until we get to the base of the wound. With that being said I think we are getting much closer to the base of the wound where we can have healthier tissue that we get a be managing here which that will be awesome. In the meantime I am not surprised by what I am seeing and in fact the wound appears to be better as compared to previous findings. 07/24/2020 upon evaluation today patient appears to be doing well with regard to her wound. Overall I am extremely pleased with where things stand today. I do not see any signs of active infection which is great and overall I think that the patient is making good progress. There is good to be some need for sharp debridement today. 07/31/2020 upon evaluation today patient appears to be doing better in regard to her heel ulcer. She has been tolerating the dressing changes without complication. Fortunately there does not appear to be any signs of active infection which is great and overall very pleased in this regard. No fevers, chills, nausea, vomiting, or diarrhea. 08/14/2020 upon evaluation today patient appears to be doing better in regard to her heel ulcer. I am very pleased in that regard. Unfortunately she still has a slight deep tissue injury in regard to the medial portion of her foot over the same area. Unfortunately I think this is something that if were not careful it was can open up into the wound. That is my main concern here based on what I see. Fortunately there does not appear to be any evidence of active infection which is great news and overall very pleased with where things stand [name] [name] this point. 08/21/2020 upon evaluation today patient  appears to be doing well with regard to her wound. She has been tolerating the dressing  changes without complication. Fortunately there does not appear to be any signs of active infection [name] [name] this time. No fevers, chills, nausea, vomiting, or diarrhea. I do believe the compression wrap was beneficial for her. 08/28/2020 upon evaluation today patient appears to be doing well with regard to her wounds currently. Fortunately there does not appear to be any signs of active infection [name] [name] this time. No fevers, chills, nausea, vomiting, or diarrhea. With that being said I think that her leg is doing quite well to be honest. 09/04/2020 upon evaluation today patient appears to be doing well with regard to her wound on the heel. This is showing signs of good epithelial growth although there is probably can it be an indention where this heals that is okay as long as we get it closed. Fortunately I do not see any evidence of infection [name] [name] this point. 09/13/2020 upon evaluation today patient appears to be doing well with regard to her wounds. She has been tolerating the dressing changes [name] [name], [name] ([id number]) without complication. Fortunately he is actually doing extremely well and I think she is making great progress this is measuring smaller. I would recommend that such that we continue with the [facility] likely since he is doing so well. Electronic Signature(s) Signed: 09/13/2020 4:43:15 PM By: Worthy Keeler PA-C Entered By: Worthy Keeler on 09/13/2020 16:43:15 [name] [name], [name] ([id number]) -------------------------------------------------------------------------------- Physical Exam Details Patient Name: [name] [name], [name] Date of Service: 09/11/2020 11:15 AM Medical Record Number: [id number] Patient Account Number: [id number] Date of Birth/Sex: February 05, 1927 (87 y.o. F) Treating RN: Dolan Amen Primary Care Provider: [name] Other Clinician: Referring Provider: [name] Treating Provider/Extender: Skipper Cliche in Treatment: 57 Constitutional Well-nourished  and well-hydrated in no acute distress. Respiratory normal breathing without difficulty. Psychiatric this patient is able to make decisions and demonstrates good insight into disease process. Alert and Oriented x 3. pleasant and cooperative. Notes Patient's wound Decubivite little bit of sharp debridement clearly some callus around the edges of the wound she tolerated that today without complication postdebridement wound bed appears to be doing much better which is great news. Electronic Signature(s) Signed: 09/13/2020 4:43:31 PM By: Worthy Keeler PA-C Entered By: Worthy Keeler on 09/13/2020 16:43:30 [name] [name], [name] ([id number]) -------------------------------------------------------------------------------- Physician Orders Details Patient Name: Blase, Esbeydi L. Date of Service: 09/11/2020 11:15 AM Medical Record Number: [id number] Patient Account Number: [id number] Date of Birth/Sex: [birth year] (87 y.o. F) Treating RN: Donnamarie Poag Primary Care Provider: [name] Other Clinician: Referring Provider: [name] Treating Provider/Extender: Skipper Cliche in Treatment: 11 Verbal / Phone Orders: No Diagnosis Coding Follow-up Appointments o Return Appointment in 1 week. o Nurse Visit as needed Bathing/ Shower/ Hygiene o May shower; gently cleanse wound with antibacterial soap, rinse and pat dry prior to dressing wounds Non-Wound Condition o Additional non-wound orders/instructions: - Pad dorsal foot with remaining HYDROFERA on top for protection Use foam pad and betadine over pressure area on top medial foot Off-Loading o Other: - Offloading boots while in bed; Try pillow under sheet to keep it in place. Wound Treatment Wound #2 - Calcaneus Wound Laterality: Left, Medial Cleanser: Soap and Water 1 x Per Week/30 Days Discharge Instructions: Gently cleanse wound with antibacterial soap, rinse and pat dry prior to dressing wounds Primary Dressing: Hydrofera  Blue Ready Transfer Foam, 2.5x2.5 (in/in) 1 x Per Week/30 Days Discharge Instructions: Apply [facility]  Ready to wound bed as directed Secondary Dressing: ABD Pad 5x9 (in/in) 1 x Per Week/30 Days Discharge Instructions: Cover with ABD pad. Compression Wrap: Profore Lite LF 3 Multilayer Compression Bandaging System 1 x Per Week/30 Days Discharge Instructions: Apply 3 multi-layer wrap as prescribed. Electronic Signature(s) Signed: 09/11/2020 4:05:30 PM By: Donnamarie Poag Signed: 09/12/2020 5:04:06 PM By: Worthy Keeler PA-C Entered By: Donnamarie Poag on 09/11/2020 12:04:15 [name] [name], [name] ([id number]) -------------------------------------------------------------------------------- Problem List Details Patient Name: [name] [name], [name] Date of Service: 09/11/2020 11:15 AM Medical Record Number: [id number] Patient Account Number: [id number] Date of Birth/Sex: 1927/02/02 (87 y.o. F) Treating RN: Dolan Amen Primary Care Provider: [name] Other Clinician: Referring Provider: [name] Treating Provider/Extender: Skipper Cliche in Treatment: 11 Active Problems ICD-10 Encounter Code Description Active Date MDM Diagnosis [phone] Pressure ulcer of left heel, stage 3 06/26/2020 No Yes I10 Essential (primary) hypertension 06/26/2020 No Yes F01.50 Vascular dementia without behavioral disturbance 06/26/2020 No Yes I25.10 Atherosclerotic heart disease of native coronary artery without angina 06/26/2020 No Yes pectoris Inactive Problems Resolved Problems Electronic Signature(s) Signed: 09/13/2020 4:42:51 PM By: Worthy Keeler PA-C Entered By: Worthy Keeler on 09/13/2020 16:42:51 [name] [name], [name] ([id number]) -------------------------------------------------------------------------------- Progress Note Details Patient Name: Atkin, Madalyne L. Date of Service: 09/11/2020 11:15 AM Medical Record Number: [id number] Patient Account Number: [id number] Date of Birth/Sex: 05/18/1926 (87  y.o. F) Treating RN: Dolan Amen Primary Care Provider: [name] Other Clinician: Referring Provider: [name] Treating Provider/Extender: Skipper Cliche in Treatment: 11 Subjective Chief Complaint Information obtained from Patient Left Heel Pressure Ulcer History of Present Illness (HPI) 87 year old patient was recently seen by the PCPs office for significant pain right great toe which has been going on since July. She was initially treated with Keflex which she did not complete and after the office visit this time she has been put on doxycycline. [name] [name] the time of her visit she was found to have a ulcer on the plantar surface of the right great toe and also had a pyogenic granuloma over this area. X-ray of the right foot done 12/29/2014 -- IMPRESSION:Soft-tissue swelling and ulceration right great toe. No underlying bony lytic lesion identified. If osteomyelitis remains of clinical concern MRI can be obtained. Past medical history significant for anemia, chronic kidney disease stage III, obesity, varicose veins, coronary artery disease, gout, history of nicotine addiction given up smoking in 2002, hypertension, status post cardiac catheterization, status post abdominal hysterectomy, cholecystectomy and tonsillectomy. hemoglobin A1c done in August was 5.8 01/22/2015 -- [name] [name] this stage the [facility] Walking boat was going to cost them significant amount of money and they want to defer using that [name] [name] the present time. 01/29/2015 -- she had a podiatry appointment and they have trimmed her toenails. She has not heard back from the vascular office regarding her venous duplex study and I have asked them to call personally so that they can get the appointment soon. 02/06/2015 -- they have made contact with the vascular office and from what I understand that test has been done but the report is pending. 02/19/2014 -- the vascular test is scheduled for tomorrow Readmission: 06/26/2020 upon  evaluation today patient appears for initial evaluation in her clinic that she has been here before in 2016 and has been quite sometime. She did have a fractured hip in February on the 23rd 2022. Subsequently this had to be pinned and she ended up with a pressure injury on her heel following when she was using her foot to  help move her around in the bed. Subsequently this has led to the wound that she has been dealing with since that time. She lives [name] [name] home with her daughter currently she does have dementia. 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 07/03/2020 upon evaluation today patient appears to be doing well with regard to her heel ulcer. She did have arterial studies they appear to be doing excellent it was premature normal across the board with TBI's in the 90s and ABIs well within normal range. Nonetheless there does not appear to be any signs of arterial insufficiency whatsoever. With that being said the patient does have also signs of improvement there is some necrotic tissue in the base of the wound number to try to clear some of that away today I do believe the Iodoflex/Iodosorb is doing well. 5/24; difficult punched out wound on the left medial heel. She has been using Iodoflex 07/17/2020 upon evaluation today patient appears to be doing well [name] [name] this point in regard to her wound. I do feel like this is a little bit deeper but again that is what is expected as we continue with the Iodoflex I think that this is just going to get deeper until we get to the base of the wound. With that being said I think we are getting much closer to the base of the wound where we can have healthier tissue that we get a be managing here which that will be awesome. In the meantime I am not surprised by what I am seeing and in fact the wound appears to be better as compared to  previous findings. 07/24/2020 upon evaluation today patient appears to be doing well with regard to her wound. Overall I am extremely pleased with where things stand today. I do not see any signs of active infection which is great and overall I think that the patient is making good progress. There is good to be some need for sharp debridement today. 07/31/2020 upon evaluation today patient appears to be doing better in regard to her heel ulcer. She has been tolerating the dressing changes without complication. Fortunately there does not appear to be any signs of active infection which is great and overall very pleased in this regard. No fevers, chills, nausea, vomiting, or diarrhea. 08/14/2020 upon evaluation today patient appears to be doing better in regard to her heel ulcer. I am very pleased in that regard. Unfortunately she still has a slight deep tissue injury in regard to the medial portion of her foot over the same area. Unfortunately I think this is something that if were not careful it was can open up into the wound. That is my main concern here based on what I see. Fortunately there does not appear to be any evidence of active infection which is great news and overall very pleased with where things stand [name] [name] this point. 08/21/2020 upon evaluation today patient appears to be doing well with regard to her wound. She has been tolerating the dressing changes without complication. Fortunately there does not appear to be any signs of active infection [name] [name] this time. No fevers, chills, nausea, vomiting, or diarrhea. I do believe the compression wrap was beneficial for her. 08/28/2020 upon evaluation today patient appears to be doing well with regard to her wounds currently. Fortunately there does not appear to be any signs of active infection [name] [name] this time. No  fevers, chills, nausea, vomiting, or diarrhea. With that being said I think that her leg is doing quite well to be honest. Nicolson, Anikah L.  ([id number]) 09/04/2020 upon evaluation today patient appears to be doing well with regard to her wound on the heel. This is showing signs of good epithelial growth although there is probably can it be an indention where this heals that is okay as long as we get it closed. Fortunately I do not see any evidence of infection [name] [name] this point. 09/13/2020 upon evaluation today patient appears to be doing well with regard to her wounds. She has been tolerating the dressing changes without complication. Fortunately he is actually doing extremely well and I think she is making great progress this is measuring smaller. I would recommend that such that we continue with the [facility] likely since he is doing so well. Objective Constitutional Well-nourished and well-hydrated in no acute distress. Vitals Time Taken: 11:14 AM, Height: 58 in, Weight: 137 lbs, BMI: 28.6, Temperature: 98.3 F, Pulse: 89 bpm, Respiratory Rate: 16 breaths/min, Blood Pressure: 149/74 mmHg. Respiratory normal breathing without difficulty. Psychiatric this patient is able to make decisions and demonstrates good insight into disease process. Alert and Oriented x 3. pleasant and cooperative. General Notes: Patient's wound Decubivite little bit of sharp debridement clearly some callus around the edges of the wound she tolerated that today without complication postdebridement wound bed appears to be doing much better which is great news. Integumentary (Hair, Skin) Wound #2 status is Open. Original cause of wound was Pressure Injury. The date acquired was: 04/11/2020. The wound has been in treatment 11 weeks. The wound is located on the Left,Medial Calcaneus. The wound measures 1cm length x 0.5cm width x 0.4cm depth; 0.393cm^2 area and 0.157cm^3 volume. There is Fat Layer (Subcutaneous Tissue) exposed. There is no tunneling or undermining noted. There is a medium amount of serosanguineous drainage noted. There is large (67-100%) pink,  pale granulation within the wound bed. There is a small (1-33%) amount of necrotic tissue within the wound bed including Adherent Slough. Assessment Active Problems ICD-10 Pressure ulcer of left heel, stage 3 Essential (primary) hypertension Vascular dementia without behavioral disturbance Atherosclerotic heart disease of native coronary artery without angina pectoris Procedures Wound #2 Pre-procedure diagnosis of Wound #2 is a Pressure Ulcer located on the Left,Medial Calcaneus . There was a Selective/Open Wound Non-Viable Tissue Debridement with a total area of 0.2 sq cm performed by Tommie Sams., PA-C. With the following instrument(s): Curette to remove Non- Viable tissue/material. Material removed includes Callus after achieving pain control using Lidocaine. A time out was conducted [name] [name] 12:05, prior to the start of the procedure. A Minimum amount of bleeding was controlled with Pressure. The procedure was tolerated well. Post Debridement Measurements: 1cm length x 0.5cm width x 0.4cm depth; 0.157cm^3 volume. Post debridement Stage noted as Category/Stage III. Character of Wound/Ulcer Post Debridement is improved. Post procedure Diagnosis Wound #2: Same as Pre-Procedure Pre-procedure diagnosis of Wound #2 is a Pressure Ulcer located on the Left,Medial Calcaneus . There was a Three Layer Compression Therapy Procedure by Donnamarie Poag, RN. Post procedure Diagnosis Wound #2: Same as Pre-Procedure Mitrano, Xandrea L. ([id number]) Plan Follow-up Appointments: Return Appointment in 1 week. Nurse Visit as needed Bathing/ Shower/ Hygiene: May shower; gently cleanse wound with antibacterial soap, rinse and pat dry prior to dressing wounds Non-Wound Condition: Additional non-wound orders/instructions: - Pad dorsal foot with remaining HYDROFERA on top for protection Use foam pad and betadine over pressure area  on top medial foot Off-Loading: Other: - Offloading boots while in bed; Try pillow  under sheet to keep it in place. WOUND #2: - Calcaneus Wound Laterality: Left, Medial Cleanser: Soap and Water 1 x Per Week/30 Days Discharge Instructions: Gently cleanse wound with antibacterial soap, rinse and pat dry prior to dressing wounds Primary Dressing: Hydrofera Blue Ready Transfer Foam, 2.5x2.5 (in/in) 1 x Per Week/30 Days Discharge Instructions: Apply Hydrofera Blue Ready to wound bed as directed Secondary Dressing: ABD Pad 5x9 (in/in) 1 x Per Week/30 Days Discharge Instructions: Cover with ABD pad. Compression Wrap: Profore Lite LF 3 Multilayer Compression Bandaging System 1 x Per Week/30 Days Discharge Instructions: Apply 3 multi-layer wrap as prescribed. 1. I would recommend that we going continue with wound care measures as before and the patient is in agreement with the plan. This includes the use of the [facility] dressing which is doing excellent. 2. I am also can recommend an ABD pad to cover. 3. We will also continue with 3 layer compression wrap that seems to be doing a great job. We will see patient back for reevaluation in 1 week here in the clinic. If anything worsens or changes patient will contact our office for additional recommendations. Electronic Signature(s) Signed: 09/13/2020 4:43:53 PM By: Worthy Keeler PA-C Entered By: Worthy Keeler on 09/13/2020 16:43:53 Mathey, Kolbee L. ([id number]) -------------------------------------------------------------------------------- SuperBill Details Patient Name: Carawan, Ramsey L. Date of Service: 09/11/2020 Medical Record Number: [id number] Patient Account Number: [id number] Date of Birth/Sex: [birth year] (87 y.o. F) Treating RN: Donnamarie Poag Primary Care Provider: [name] Other Clinician: Referring Provider: [name] Treating Provider/Extender: Skipper Cliche in Treatment: 11 Diagnosis Coding ICD-10 Codes Code Description [phone] Pressure ulcer of left heel, stage 3 I10 Essential (primary)  hypertension F01.50 Vascular dementia without behavioral disturbance I25.10 Atherosclerotic heart disease of native coronary artery without angina pectoris Facility Procedures CPT4 Code: [id number] Description: [id number] - DEBRIDE WOUND 1ST 20 SQ CM OR < Modifier: Quantity: 1 CPT4 Code: Description: ICD-10 Diagnosis Description L89.623 Pressure ulcer of left heel, stage 3 Modifier: Quantity: Physician Procedures CPT4 Code: [id number] Description: 97597 - WC PHYS DEBR WO ANESTH 20 SQ CM Modifier: Quantity: 1 CPT4 Code: Description: ICD-10 Diagnosis Description L89.623 Pressure ulcer of left heel, stage 3 Modifier: Quantity: Electronic Signature(s) Signed: 09/11/2020 4:05:30 PM By: Donnamarie Poag Signed: 09/12/2020 5:04:06 PM By: Worthy Keeler PA-C Entered By: Donnamarie Poag on 09/11/2020 12:07:59

## 2020-09-14 ENCOUNTER — Other Ambulatory Visit: Payer: Self-pay

## 2020-09-14 ENCOUNTER — Ambulatory Visit: Payer: PPO | Admitting: Podiatry

## 2020-09-14 DIAGNOSIS — M79676 Pain in unspecified toe(s): Secondary | ICD-10-CM

## 2020-09-14 DIAGNOSIS — B351 Tinea unguium: Secondary | ICD-10-CM | POA: Diagnosis not present

## 2020-09-14 NOTE — Progress Notes (Signed)
   SUBJECTIVE Patient presents to office today complaining of elongated, thickened nails that cause pain while ambulating in shoes.  Patient is unable to trim their own nails.  Dressings left intact to the left lower extremity.  Patient is here for further evaluation and treatment.  Past Medical History:  Diagnosis Date   Hyperlipidemia    Hypertension    Myocardial infarction [facility])     OBJECTIVE General Patient is awake, alert, and oriented x 3 and in no acute distress. Derm Skin is dry and supple bilateral. Negative open lesions or macerations. Remaining integument unremarkable. Nails are tender, long, thickened and dystrophic with subungual debris, consistent with onychomycosis, 1-5 bilateral. No signs of infection noted. Vasc capillary refill immediate.  Temperature gradient within normal limits.  Neuro Epicritic and protective threshold sensation grossly intact bilaterally.  Musculoskeletal Exam No symptomatic pedal deformities noted bilateral. Muscular strength within normal limits.  ASSESSMENT 1.  Pain due to onychomycosis of toenails both  PLAN OF CARE 1. Patient evaluated today.  2. Instructed to maintain good pedal hygiene and foot care.  3. Mechanical debridement of nails 1-5 bilaterally performed using a nail nipper. Filed with dremel without incident.  4.  Continue management at the [facility] wound care center  5.  Return to clinic in 3 mos.    Edrick Kins, DPM [facility]  Dr. Edrick Kins, DPM    [address], [place] [address]                Office [phone]  Fax [phone]

## 2020-09-18 ENCOUNTER — Other Ambulatory Visit: Payer: Self-pay

## 2020-09-18 ENCOUNTER — Encounter: Payer: PPO | Attending: Physician Assistant | Admitting: Physician Assistant

## 2020-09-18 DIAGNOSIS — M109 Gout, unspecified: Secondary | ICD-10-CM | POA: Diagnosis not present

## 2020-09-18 DIAGNOSIS — N186 End stage renal disease: Secondary | ICD-10-CM | POA: Insufficient documentation

## 2020-09-18 DIAGNOSIS — I120 Hypertensive chronic kidney disease with stage 5 chronic kidney disease or end stage renal disease: Secondary | ICD-10-CM | POA: Diagnosis not present

## 2020-09-18 DIAGNOSIS — F0150 Vascular dementia without behavioral disturbance: Secondary | ICD-10-CM | POA: Insufficient documentation

## 2020-09-18 DIAGNOSIS — L89893 Pressure ulcer of other site, stage 3: Secondary | ICD-10-CM | POA: Diagnosis not present

## 2020-09-18 DIAGNOSIS — L97519 Non-pressure chronic ulcer of other part of right foot with unspecified severity: Secondary | ICD-10-CM | POA: Insufficient documentation

## 2020-09-18 DIAGNOSIS — I2510 Atherosclerotic heart disease of native coronary artery without angina pectoris: Secondary | ICD-10-CM | POA: Diagnosis not present

## 2020-09-18 DIAGNOSIS — L89623 Pressure ulcer of left heel, stage 3: Secondary | ICD-10-CM | POA: Insufficient documentation

## 2020-09-18 NOTE — Progress Notes (Signed)
[name] [name]. ([id number]) Visit Report for 09/18/2020 Arrival Information Details Patient Name: [name] [name]. Date of Service: 09/18/2020 9:30 AM Medical Record Number: [id number] Patient Account Number: [id number] Date of Birth/Sex: 1926-05-02 (87 y.o. F) Treating RN: Donnamarie Poag Primary Care Mardell Suttles: [name] Other Clinician: Referring Imre Vecchione: [name] Treating Larenda Reedy/Extender: Skipper Cliche in Treatment: 12 Visit Information History Since Last Visit Added or deleted any medications: No Patient Arrived: Wheel Chair Had a fall or experienced change in No Arrival Time: 09:35 activities of daily living that may affect Accompanied By: daughter risk of falls: Transfer Assistance: None Hospitalized since last visit: No Patient Identification Verified: Yes Has Dressing in Place as Prescribed: Yes Secondary Verification Process Completed: Yes Has Compression in Place as Prescribed: Yes Patient Requires Transmission-Based No Pain Present Now: No Precautions: Patient Has Alerts: Yes Patient Alerts: NOT diabetic ABI R1.18 L1.25 06/27/20 Electronic Signature(s) Signed: 09/18/2020 1:44:11 PM By: Donnamarie Poag Entered ByDonnamarie Poag on 09/18/2020 09:36:07 [name] [name]. ([id number]) -------------------------------------------------------------------------------- Clinic Level of Care Assessment Details Patient Name: [name] [name]. Date of Service: 09/18/2020 9:30 AM Medical Record Number: [id number] Patient Account Number: [id number] Date of Birth/Sex: 1926-12-25 (87 y.o. F) Treating RN: Donnamarie Poag Primary Care Frankee Gritz: [name] Other Clinician: Referring Ronav Furney: [name] Treating Ellwyn Ergle/Extender: Skipper Cliche in Treatment: 12 Clinic Level of Care Assessment Items TOOL 1 Quantity Score _0  - Use when EandM and Procedure is performed on INITIAL visit 0 ASSESSMENTS - Nursing Assessment / Reassessment _1  - General Physical Exam  (combine w/ comprehensive assessment (listed just below) when performed on new 0 pt. evals) _2  - 0 Comprehensive Assessment (HX, ROS, Risk Assessments, Wounds Hx, etc.) ASSESSMENTS - Wound and Skin Assessment / Reassessment _3  - Dermatologic / Skin Assessment (not related to wound area) 0 ASSESSMENTS - Ostomy and/or Continence Assessment and Care _4  - Incontinence Assessment and Management 0 _5  - 0 Ostomy Care Assessment and Management (repouching, etc.) PROCESS - Coordination of Care _6  - Simple Patient / Family Education for ongoing care 0 _7  - 0 Complex (extensive) Patient / Family Education for ongoing care _8  - 0 Staff obtains Programmer, systems, Records, Test Results / Process Orders _9  - 0 Staff telephones HHA, Nursing Homes / Clarify orders / etc _10  - 0 Routine Transfer to another Facility (non-emergent condition) _11  - 0 Routine Hospital Admission (non-emergent condition) _12  - 0 New Admissions / Biomedical engineer / Ordering NPWT, Apligraf, etc. _13  - 0 Emergency Hospital Admission (emergent condition) PROCESS - Special Needs _14  - Pediatric / Minor Patient Management 0 _15  - 0 Isolation Patient Management _16  - 0 Hearing / Language / Visual special needs _17  - 0 Assessment of Community assistance (transportation, D/C planning, etc.) _18  - 0 Additional assistance / Altered mentation _19  - 0 Support Surface(s) Assessment (bed, cushion, seat, etc.) INTERVENTIONS - Miscellaneous _20  - External ear exam 0 _21  - 0 Patient Transfer (multiple staff / Civil Service fast streamer / Similar devices) _22  - 0 Simple Staple / Suture removal (25 or less) _23  - 0 Complex Staple / Suture removal (26 or more) _24  - 0 Hypo/Hyperglycemic Management (do not check if billed separately) _25  - 0 Ankle / Brachial Index (ABI) - do not check if billed separately Has the patient been seen at the hospital within the last three years: Yes Total Score: 0 Level Of Care: ____ [name] [name]. ([id number]) Electronic  Signature(s) Signed: 09/18/2020 1:44:11 PM By: Donnamarie Poag Entered By: Donnamarie Poag on 09/18/2020 10:27:46 [name] [name]. ([id number]) --------------------------------------------------------------------------------  Compression Therapy Details Patient Name: [name] [name]. Date of Service: 09/18/2020 9:30 AM Medical Record Number: [id number] Patient Account Number: [id number] Date of Birth/Sex: 1927-01-05 (87 y.o. F) Treating RN: Donnamarie Poag Primary Care Raziel Koenigs: [name] Other Clinician: Referring Sarim Rothman: [name] Treating Elyce Zollinger/Extender: Skipper Cliche in Treatment: 12 Compression Therapy Performed for Wound Assessment: Wound #2 Left,Medial Calcaneus Performed By: Clinician Donnamarie Poag, RN Compression Type: Three Layer Post Procedure Diagnosis Same as Pre-procedure Electronic Signature(s) Signed: 09/18/2020 1:44:11 PM By: Donnamarie Poag Entered By: Donnamarie Poag on 09/18/2020 10:25:39 [name] [name]. ([id number]) -------------------------------------------------------------------------------- Encounter Discharge Information Details Patient Name: [name] [name]. Date of Service: 09/18/2020 9:30 AM Medical Record Number: [id number] Patient Account Number: [id number] Date of Birth/Sex: 07-29-1926 (87 y.o. F) Treating RN: Donnamarie Poag Primary Care Avira Tillison: [name] Other Clinician: Referring Nejla Reasor: [name] Treating Tj Kitchings/Extender: Skipper Cliche in Treatment: 12 Encounter Discharge Information Items Discharge Condition: Stable Ambulatory Status: Wheelchair Discharge Destination: Home Transportation: Private Auto Accompanied By: daughter Schedule Follow-up Appointment: Yes Clinical Summary of Care: Electronic Signature(s) Signed: 09/18/2020 1:44:11 PM By: Donnamarie Poag Entered ByDonnamarie Poag on 09/18/2020 10:38:18 Meroney, Kathi [name]. ([id number]) -------------------------------------------------------------------------------- Lower Extremity  Assessment Details Patient Name: [name] [name]. Date of Service: 09/18/2020 9:30 AM Medical Record Number: [id number] Patient Account Number: [id number] Date of Birth/Sex: 11/17/1926 (87 y.o. F) Treating RN: Donnamarie Poag Primary Care Nimah Uphoff: [name] Other Clinician: Referring Jerrik Housholder: [name] Treating Jenasia Dolinar/Extender: Skipper Cliche in Treatment: 12 Edema Assessment Assessed: [Left: Yes] Patrice Paradise: No] [Left: Edema] [Right: :] Calf Left: Right: Point of Measurement: 32 cm From Medial Instep 32 cm Ankle Left: Right: Point of Measurement: 12 cm From Medial Instep 22 cm Electronic Signature(s) Signed: 09/18/2020 1:44:11 PM By: Donnamarie Poag Entered ByDonnamarie Poag on 09/18/2020 09:48:41 Foutz, Emogene [name]. ([id number]) -------------------------------------------------------------------------------- Multi Wound Chart Details Patient Name: [name] [name]. Date of Service: 09/18/2020 9:30 AM Medical Record Number: [id number] Patient Account Number: [id number] Date of Birth/Sex: 1926/05/05 (87 y.o. F) Treating RN: Donnamarie Poag Primary Care Shamiracle Gorden: [name] Other Clinician: Referring Jemia Fata: [name] Treating Bryttney Netzer/Extender: Skipper Cliche in Treatment: 12 Vital Signs Height(in): 58 Pulse(bpm): 52 Weight(lbs): 137 Blood Pressure(mmHg): 138/75 Body Mass Index(BMI): 29 Temperature(F): 98.1 Respiratory Rate(breaths/min): 16 Photos: [N/A:N/A] Wound Location: Left, Medial Calcaneus N/A N/A Wounding Event: Pressure Injury N/A N/A Primary Etiology: Pressure Ulcer N/A N/A Comorbid History: Cataracts, Anemia, Coronary Artery N/A N/A Disease, Hypertension, Myocardial Infarction, End Stage Renal Disease, Gout, Osteoarthritis, Dementia Date Acquired: 04/11/2020 N/A N/A Weeks of Treatment: 12 N/A N/A Wound Status: Open N/A N/A Measurements [name] x W x D (cm) 0.9x0.5x0.4 N/A N/A Area (cm) : 0.353 N/A N/A Volume (cm) : 0.141 N/A N/A % Reduction in  Area: 70.00% N/A N/A % Reduction in Volume: 70.10% N/A N/A Classification: Category/Stage III N/A N/A Exudate Amount: Medium N/A N/A Exudate Type: Serosanguineous N/A N/A Exudate Color: red, brown N/A N/A Granulation Amount: Large (67-100%) N/A N/A Granulation Quality: Pink, Pale N/A N/A Necrotic Amount: Small (1-33%) N/A N/A Exposed Structures: Fat Layer (Subcutaneous Tissue): N/A N/A Yes Fascia: No Tendon: No Muscle: No Joint: No Bone: No Epithelialization: None N/A N/A Treatment Notes Electronic Signature(s) Signed: 09/18/2020 1:44:11 PM By: Donnamarie Poag Entered ByDonnamarie Poag on 09/18/2020 10:25:25 Armond, Garrie [name]. ([id number]) -------------------------------------------------------------------------------- [place] Details Patient Name: [name] [name]. Date of Service: 09/18/2020 9:30 AM Medical Record Number: [id number] Patient Account Number: [id number] Date of Birth/Sex: 1926/12/20 (87 y.o. F) Treating RN: Donnamarie Poag Primary Care Zackarie Chason:  [name] Other Clinician: Referring Krystofer Hevener: [name] Treating Corah Willeford/Extender: Skipper Cliche in Treatment: 12 Active Inactive Wound/Skin Impairment Nursing Diagnoses: Impaired tissue integrity Goals: Patient/caregiver will verbalize understanding of skin care regimen Date Initiated: 06/26/2020 Date Inactivated: 08/21/2020 Target Resolution Date: 06/26/2020 Goal Status: Met Ulcer/skin breakdown will have a volume reduction of 30% by week 4 Date Initiated: 06/26/2020 Date Inactivated: 08/21/2020 Target Resolution Date: 07/27/2020 Goal Status: Met Ulcer/skin breakdown will have a volume reduction of 50% by week 8 Date Initiated: 06/26/2020 Date Inactivated: 09/18/2020 Target Resolution Date: 08/26/2020 Goal Status: Unmet Unmet Reason: con't tx Ulcer/skin breakdown will have a volume reduction of 80% by week 12 Date Initiated: 06/26/2020 Target Resolution Date: 09/26/2020 Goal Status:  Active Ulcer/skin breakdown will heal within 14 weeks Date Initiated: 06/26/2020 Target Resolution Date: 10/27/2020 Goal Status: Active Interventions: Assess patient/caregiver ability to obtain necessary supplies Assess patient/caregiver ability to perform ulcer/skin care regimen upon admission and as needed Assess ulceration(s) every visit Provide education on ulcer and skin care Treatment Activities: Referred to DME Vaudine Dutan for dressing supplies : 06/26/2020 Skin care regimen initiated : 06/26/2020 Notes: Electronic Signature(s) Signed: 09/18/2020 1:44:11 PM By: Donnamarie Poag Entered ByDonnamarie Poag on 09/18/2020 10:25:14 [name] [name]. ([id number]) -------------------------------------------------------------------------------- Pain Assessment Details Patient Name: [name] [name]. Date of Service: 09/18/2020 9:30 AM Medical Record Number: [id number] Patient Account Number: [id number] Date of Birth/Sex: [birth year] (87 y.o. F) Treating RN: Donnamarie Poag Primary Care Tommye Lehenbauer: [name] Other Clinician: Referring Kiara Keep: [name] Treating Dieter [name]/Extender: Skipper Cliche in Treatment: 12 Active Problems Location of Pain Severity and Description of Pain Patient Has Paino No Site Locations Rate the pain. Current Pain Level: 0 Pain Management and Medication Current Pain Management: Electronic Signature(s) Signed: 09/18/2020 1:44:11 PM By: Donnamarie Poag Entered By: Donnamarie Poag on 09/18/2020 09:43:52 Proby, Lilie [name]. ([id number]) -------------------------------------------------------------------------------- Patient/Caregiver Education Details Patient Name: [name] [name]. Date of Service: 09/18/2020 9:30 AM Medical Record Number: [id number] Patient Account Number: [id number] Date of Birth/Gender: 1926-10-31 (87 y.o. F) Treating RN: Donnamarie Poag Primary Care Physician: [name] Other Clinician: Referring Physician: [name] Treating Physician/Extender:  Skipper Cliche in Treatment: 12 Education Assessment Education Provided To: Patient and Caregiver Education Topics Provided Offloading: Wound/Skin Impairment: Engineer, maintenance) Signed: 09/18/2020 1:44:11 PM By: Donnamarie Poag Entered ByDonnamarie Poag on 09/18/2020 10:28:10 [name] [name]. ([id number]) -------------------------------------------------------------------------------- Wound Assessment Details Patient Name: [name] [name]. Date of Service: 09/18/2020 9:30 AM Medical Record Number: [id number] Patient Account Number: [id number] Date of Birth/Sex: [birth year] (87 y.o. F) Treating RN: Donnamarie Poag Primary Care Iveth Heidemann: [name] Other Clinician: Referring Landy Mace: [name] Treating Fallyn Munnerlyn/Extender: Skipper Cliche in Treatment: 12 Wound Status Wound Number: 2 Primary Pressure Ulcer Etiology: Wound Location: Left, Medial Calcaneus Wound Open Wounding Event: Pressure Injury Status: Date Acquired: 04/11/2020 Comorbid Cataracts, Anemia, Coronary Artery Disease, Weeks Of Treatment: 12 History: Hypertension, Myocardial Infarction, End Stage Renal Clustered Wound: No Disease, Gout, Osteoarthritis, Dementia Photos Wound Measurements Length: (cm) 0.9 Width: (cm) 0.5 Depth: (cm) 0.4 Area: (cm) 0.353 Volume: (cm) 0.141 % Reduction in Area: 70% % Reduction in Volume: 70.1% Epithelialization: None Tunneling: No Undermining: No Wound Description Classification: Category/Stage III Exudate Amount: Medium Exudate Type: Serosanguineous Exudate Color: red, brown Foul Odor After Cleansing: No Slough/Fibrino Yes Wound Bed Granulation Amount: Large (67-100%) Exposed Structure Granulation Quality: Pink, Pale Fascia Exposed: No Necrotic Amount: Small (1-33%) Fat Layer (Subcutaneous Tissue) Exposed: Yes Necrotic Quality: Adherent Slough Tendon Exposed: No Muscle Exposed: No Joint Exposed: No Bone Exposed: No  Treatment Notes Wound #2 (Calcaneus)  Wound Laterality: Left, Medial Cleanser Soap and Water Discharge Instruction: Gently cleanse wound with antibacterial soap, rinse and pat dry prior to dressing wounds Peri-Wound Care Benjamin, [name] [name]. ([id number]) Topical Primary Dressing Hydrofera Blue Ready Transfer Foam, 2.5x2.5 (in/in) Discharge Instruction: Apply Hydrofera Blue Ready to wound bed as directed Secondary Dressing ABD Pad 5x9 (in/in) Discharge Instruction: Cover with ABD pad. Secured With Compression Wrap Profore Lite LF 3 Multilayer Compression Bandaging System Discharge Instruction: Apply 3 multi-layer wrap as prescribed. Compression Stockings Add-Ons Electronic Signature(s) Signed: 09/18/2020 1:44:11 PM By: Donnamarie Poag Entered By: Donnamarie Poag on 09/18/2020 09:50:04 Baillie, [place] ([id number]) -------------------------------------------------------------------------------- Vitals Details Patient Name: Wemhoff, Deyna [name]. Date of Service: 09/18/2020 9:30 AM Medical Record Number: [id number] Patient Account Number: [id number] Date of Birth/Sex: Nov 07, 1926 (87 y.o. F) Treating RN: Donnamarie Poag Primary Care Nazar Kuan: [name] Other Clinician: Referring Nyala Kirchner: [name] Treating Dyani Babel/Extender: Skipper Cliche in Treatment: 12 Vital Signs Time Taken: 09:39 Temperature (F): 98.1 Height (in): 58 Pulse (bpm): 78 Weight (lbs): 137 Respiratory Rate (breaths/min): 16 Body Mass Index (BMI): 28.6 Blood Pressure (mmHg): 138/75 Reference Range: 80 - 120 mg / dl Electronic Signature(s) Signed: 09/18/2020 1:44:11 PM By: Donnamarie Poag Entered ByDonnamarie Poag on 09/18/2020 09:39:27

## 2020-09-18 NOTE — Progress Notes (Addendum)
Guard, Adah L. ([id number]) Visit Report for 09/18/2020 Chief Complaint Document Details Patient Name: [name] [name], [name] Date of Service: 09/18/2020 9:30 AM Medical Record Number: [id number] Patient Account Number: [id number] Date of Birth/Sex: November 02, 1926 (87 y.o. F) Treating RN: Donnamarie Poag Primary Care Provider: [name] Other Clinician: Referring Provider: [name] Treating Provider/Extender: Skipper Cliche in Treatment: 12 Information Obtained from: Patient Chief Complaint Left Heel Pressure Ulcer Electronic Signature(s) Signed: 09/18/2020 9:51:06 AM By: Worthy Keeler PA-C Entered By: Worthy Keeler on 09/18/2020 09:51:05 [name] [name], [name] ([id number]) -------------------------------------------------------------------------------- HPI Details Patient Name: [name] [name], [name] Date of Service: 09/18/2020 9:30 AM Medical Record Number: [id number] Patient Account Number: [id number] Date of Birth/Sex: 1926/12/27 (87 y.o. F) Treating RN: Donnamarie Poag Primary Care Provider: [name] Other Clinician: Referring Provider: [name] Treating Provider/Extender: Skipper Cliche in Treatment: 12 History of Present Illness HPI Description: 87 year old patient was recently seen by the PCPs office for significant pain right great toe which has been going on since July. She was initially treated with Keflex which she did not complete and after the office visit this time she has been put on doxycycline. at the time of her visit she was found to have a ulcer on the plantar surface of the right great toe and also had a pyogenic granuloma over this area. X-ray of the right foot done 12/29/2014 -- IMPRESSION:Soft-tissue swelling and ulceration right great toe. No underlying bony lytic lesion identified. If osteomyelitis remains of clinical concern MRI can be obtained. Past medical history significant for anemia, chronic kidney disease stage III, obesity, varicose veins,  coronary artery disease, gout, history of nicotine addiction given up smoking in 2002, hypertension, status post cardiac catheterization, status post abdominal hysterectomy, cholecystectomy and tonsillectomy. hemoglobin A1c done in August was 5.8 01/22/2015 -- at this stage the [facility] Walking boat was going to cost them significant amount of money and they want to defer using that at the present time. 01/29/2015 -- she had a podiatry appointment and they have trimmed her toenails. She has not heard back from the vascular office regarding her venous duplex study and I have asked them to call personally so that they can get the appointment soon. 02/06/2015 -- they have made contact with the vascular office and from what I understand that test has been done but the report is pending. 02/19/2014 -- the vascular test is scheduled for tomorrow Readmission: 06/26/2020 upon evaluation today patient appears for initial evaluation in her clinic that she has been here before in 2016 and has been quite sometime. She did have a fractured hip in February on the 23rd 2022. Subsequently this had to be pinned and she ended up with a pressure injury on her heel following when she was using her foot to help move her around in the bed. Subsequently this has led to the wound that she has been dealing with since that time. She lives at home with her daughter currently she does have dementia. 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 07/03/2020 upon evaluation today patient appears to be doing well with regard to her heel ulcer. She did have arterial studies they appear to be doing excellent it was premature normal across the board with TBI's in the 90s and ABIs well within normal range. Nonetheless there does not appear to be any signs of arterial insufficiency whatsoever. With that being said  the patient does have also signs of improvement there is some necrotic tissue in the base of the wound number to try to clear some of that away today I do believe the Iodoflex/Iodosorb is doing well. 5/24; difficult punched out wound on the left medial heel. She has been using Iodoflex 07/17/2020 upon evaluation today patient appears to be doing well at this point in regard to her wound. I do feel like this is a little bit deeper but again that is what is expected as we continue with the Iodoflex I think that this is just going to get deeper until we get to the base of the wound. With that being said I think we are getting much closer to the base of the wound where we can have healthier tissue that we get a be managing here which that will be awesome. In the meantime I am not surprised by what I am seeing and in fact the wound appears to be better as compared to previous findings. 07/24/2020 upon evaluation today patient appears to be doing well with regard to her wound. Overall I am extremely pleased with where things stand today. I do not see any signs of active infection which is great and overall I think that the patient is making good progress. There is good to be some need for sharp debridement today. 07/31/2020 upon evaluation today patient appears to be doing better in regard to her heel ulcer. She has been tolerating the dressing changes without complication. Fortunately there does not appear to be any signs of active infection which is great and overall very pleased in this regard. No fevers, chills, nausea, vomiting, or diarrhea. 08/14/2020 upon evaluation today patient appears to be doing better in regard to her heel ulcer. I am very pleased in that regard. Unfortunately she still has a slight deep tissue injury in regard to the medial portion of her foot over the same area. Unfortunately I think this is something that if were not careful it was can open up into the wound. That is my main  concern here based on what I see. Fortunately there does not appear to be any evidence of active infection which is great news and overall very pleased with where things stand at this point. 08/21/2020 upon evaluation today patient appears to be doing well with regard to her wound. She has been tolerating the dressing changes without complication. Fortunately there does not appear to be any signs of active infection at this time. No fevers, chills, nausea, vomiting, or diarrhea. I do believe the compression wrap was beneficial for her. 08/28/2020 upon evaluation today patient appears to be doing well with regard to her wounds currently. Fortunately there does not appear to be any signs of active infection at this time. No fevers, chills, nausea, vomiting, or diarrhea. With that being said I think that her leg is doing quite well to be honest. 09/04/2020 upon evaluation today patient appears to be doing well with regard to her wound on the heel. This is showing signs of good epithelial growth although there is probably can it be an indention where this heals that is okay as long as we get it closed. Fortunately I do not see any evidence of infection at this point. 09/13/2020 upon evaluation today patient appears to be doing well with regard to her wounds. She has been tolerating the dressing changes Bromwell, Hatsuko L. ([id number]) without complication. Fortunately he is actually doing extremely well and I think she is making  great progress this is measuring smaller. I would recommend that such that we continue with the [facility] likely since he is doing so well. 09/18/2020 upon evaluation today patient appears to be doing well with regard to her heel ulcer. She is making good progress and I am very pleased with what we see today. I think the Hydrofera Blue is still doing excellent. Electronic Signature(s) Signed: 09/18/2020 10:35:47 AM By: Worthy Keeler PA-C Entered By: Worthy Keeler on 09/18/2020  10:35:47 [name] [name], [name] ([id number]) -------------------------------------------------------------------------------- Physical Exam Details Patient Name: Mcallister, Gerilyn L. Date of Service: 09/18/2020 9:30 AM Medical Record Number: [id number] Patient Account Number: [id number] Date of Birth/Sex: Sep 23, 1926 (87 y.o. F) Treating RN: Donnamarie Poag Primary Care Provider: [name] Other Clinician: Referring Provider: [name] Treating Provider/Extender: Skipper Cliche in Treatment: 93 Constitutional Well-nourished and well-hydrated in no acute distress. Respiratory normal breathing without difficulty. Psychiatric this patient is able to make decisions and demonstrates good insight into disease process. Alert and Oriented x 3. pleasant and cooperative. Notes Upon inspection patient's wound bed actually showed signs of good granulation epithelization at this point. There does not appear to be any signs of active infection which is great news and overall I am extremely pleased with where things stand today. No fevers, chills, nausea, vomiting, or diarrhea. Electronic Signature(s) Signed: 09/18/2020 10:36:13 AM By: Worthy Keeler PA-C Entered By: Worthy Keeler on 09/18/2020 10:36:13 [name] [name], [name] ([id number]) -------------------------------------------------------------------------------- Physician Orders Details Patient Name: [name] [name], [name] Date of Service: 09/18/2020 9:30 AM Medical Record Number: [id number] Patient Account Number: [id number] Date of Birth/Sex: 1926-08-31 (87 y.o. F) Treating RN: Donnamarie Poag Primary Care Provider: [name] Other Clinician: Referring Provider: [name] Treating Provider/Extender: Skipper Cliche in Treatment: 12 Verbal / Phone Orders: No Diagnosis Coding ICD-10 Coding Code Description [phone] Pressure ulcer of left heel, stage 3 I10 Essential (primary) hypertension F01.50 Vascular dementia without behavioral  disturbance I25.10 Atherosclerotic heart disease of native coronary artery without angina pectoris Follow-up Appointments o Return Appointment in 2 weeks. - provider o Nurse Visit as needed - week of 8/9-nurse Bathing/ Shower/ Hygiene o May shower; gently cleanse wound with antibacterial soap, rinse and pat dry prior to dressing wounds Non-Wound Condition o Additional non-wound orders/instructions: - Pad dorsal foot with remaining HYDROFERA on top for protection Use foam pad and betadine over pressure area on top medial foot Off-Loading o Other: - Offloading boots while in bed; Try pillow under sheet to keep it in place. Wound Treatment Wound #2 - Calcaneus Wound Laterality: Left, Medial Cleanser: Soap and Water 1 x Per Week/30 Days Discharge Instructions: Gently cleanse wound with antibacterial soap, rinse and pat dry prior to dressing wounds Primary Dressing: Hydrofera Blue Ready Transfer Foam, 2.5x2.5 (in/in) 1 x Per Week/30 Days Discharge Instructions: Apply Hydrofera Blue Ready to wound bed as directed Secondary Dressing: ABD Pad 5x9 (in/in) 1 x Per Week/30 Days Discharge Instructions: Cover with ABD pad. Compression Wrap: Profore Lite LF 3 Multilayer Compression Bandaging System 1 x Per Week/30 Days Discharge Instructions: Apply 3 multi-layer wrap as prescribed. Electronic Signature(s) Signed: 09/18/2020 1:44:11 PM By: Donnamarie Poag Signed: 09/18/2020 6:20:17 PM By: Worthy Keeler PA-C Entered By: Donnamarie Poag on 09/18/2020 10:27:29 [name] [name], [name] ([id number]) -------------------------------------------------------------------------------- Problem List Details Patient Name: Slager, Manilla L. Date of Service: 09/18/2020 9:30 AM Medical Record Number: [id number] Patient Account Number: [id number] Date of Birth/Sex: Apr 08, 1926 (87 y.o. F) Treating RN: Donnamarie Poag Primary Care Provider: [name] Other Clinician:  Referring Provider: [name] Treating  Provider/Extender: Skipper Cliche in Treatment: 12 Active Problems ICD-10 Encounter Code Description Active Date MDM Diagnosis L89.623 Pressure ulcer of left heel, stage 3 06/26/2020 No Yes I10 Essential (primary) hypertension 06/26/2020 No Yes F01.50 Vascular dementia without behavioral disturbance 06/26/2020 No Yes I25.10 Atherosclerotic heart disease of native coronary artery without angina 06/26/2020 No Yes pectoris Inactive Problems Resolved Problems Electronic Signature(s) Signed: 09/18/2020 9:51:00 AM By: Worthy Keeler PA-C Entered By: Worthy Keeler on 09/18/2020 09:50:59 [name] [name], [name] ([id number]) -------------------------------------------------------------------------------- Progress Note Details Patient Name: [name] [name], [name] Date of Service: 09/18/2020 9:30 AM Medical Record Number: [id number] Patient Account Number: [id number] Date of Birth/Sex: 1926-06-30 (87 y.o. F) Treating RN: Donnamarie Poag Primary Care Provider: [name] Other Clinician: Referring Provider: [name] Treating Provider/Extender: Skipper Cliche in Treatment: 12 Subjective Chief Complaint Information obtained from Patient Left Heel Pressure Ulcer History of Present Illness (HPI) 87 year old patient was recently seen by the PCPs office for significant pain right great toe which has been going on since July. She was initially treated with Keflex which she did not complete and after the office visit this time she has been put on doxycycline. at the time of her visit she was found to have a ulcer on the plantar surface of the right great toe and also had a pyogenic granuloma over this area. X-ray of the right foot done 12/29/2014 -- IMPRESSION:Soft-tissue swelling and ulceration right great toe. No underlying bony lytic lesion identified. If osteomyelitis remains of clinical concern MRI can be obtained. Past medical history significant for anemia, chronic kidney disease stage III,  obesity, varicose veins, coronary artery disease, gout, history of nicotine addiction given up smoking in 2002, hypertension, status post cardiac catheterization, status post abdominal hysterectomy, cholecystectomy and tonsillectomy. hemoglobin A1c done in August was 5.8 01/22/2015 -- at this stage the [facility] Walking boat was going to cost them significant amount of money and they want to defer using that at the present time. 01/29/2015 -- she had a podiatry appointment and they have trimmed her toenails. She has not heard back from the vascular office regarding her venous duplex study and I have asked them to call personally so that they can get the appointment soon. 02/06/2015 -- they have made contact with the vascular office and from what I understand that test has been done but the report is pending. 02/19/2014 -- the vascular test is scheduled for tomorrow Readmission: 06/26/2020 upon evaluation today patient appears for initial evaluation in her clinic that she has been here before in 2016 and has been quite sometime. She did have a fractured hip in February on the 23rd 2022. Subsequently this had to be pinned and she ended up with a pressure injury on her heel following when she was using her foot to help move her around in the bed. Subsequently this has led to the wound that she has been dealing with since that time. She lives at home with her daughter currently she does have dementia. 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 07/03/2020 upon evaluation today patient appears to be doing well with regard to her heel ulcer. She did have arterial studies they appear to be doing excellent it was premature normal across the board with TBI's in the 90s and ABIs well within normal range. Nonetheless there does not appear to be any signs of  arterial insufficiency  whatsoever. With that being said the patient does have also signs of improvement there is some necrotic tissue in the base of the wound number to try to clear some of that away today I do believe the Iodoflex/Iodosorb is doing well. 5/24; difficult punched out wound on the left medial heel. She has been using Iodoflex 07/17/2020 upon evaluation today patient appears to be doing well at this point in regard to her wound. I do feel like this is a little bit deeper but again that is what is expected as we continue with the Iodoflex I think that this is just going to get deeper until we get to the base of the wound. With that being said I think we are getting much closer to the base of the wound where we can have healthier tissue that we get a be managing here which that will be awesome. In the meantime I am not surprised by what I am seeing and in fact the wound appears to be better as compared to previous findings. 07/24/2020 upon evaluation today patient appears to be doing well with regard to her wound. Overall I am extremely pleased with where things stand today. I do not see any signs of active infection which is great and overall I think that the patient is making good progress. There is good to be some need for sharp debridement today. 07/31/2020 upon evaluation today patient appears to be doing better in regard to her heel ulcer. She has been tolerating the dressing changes without complication. Fortunately there does not appear to be any signs of active infection which is great and overall very pleased in this regard. No fevers, chills, nausea, vomiting, or diarrhea. 08/14/2020 upon evaluation today patient appears to be doing better in regard to her heel ulcer. I am very pleased in that regard. Unfortunately she still has a slight deep tissue injury in regard to the medial portion of her foot over the same area. Unfortunately I think this is something that if were not careful it was can open up  into the wound. That is my main concern here based on what I see. Fortunately there does not appear to be any evidence of active infection which is great news and overall very pleased with where things stand at this point. 08/21/2020 upon evaluation today patient appears to be doing well with regard to her wound. She has been tolerating the dressing changes without complication. Fortunately there does not appear to be any signs of active infection at this time. No fevers, chills, nausea, vomiting, or diarrhea. I do believe the compression wrap was beneficial for her. 08/28/2020 upon evaluation today patient appears to be doing well with regard to her wounds currently. Fortunately there does not appear to be any signs of active infection at this time. No fevers, chills, nausea, vomiting, or diarrhea. With that being said I think that her leg is doing quite well to be honest. [name] [name], [name] ([id number]) 09/04/2020 upon evaluation today patient appears to be doing well with regard to her wound on the heel. This is showing signs of good epithelial growth although there is probably can it be an indention where this heals that is okay as long as we get it closed. Fortunately I do not see any evidence of infection at this point. 09/13/2020 upon evaluation today patient appears to be doing well with regard to her wounds. She has been tolerating the dressing changes without complication. Fortunately he is actually doing  extremely well and I think she is making great progress this is measuring smaller. I would recommend that such that we continue with the [facility] likely since he is doing so well. 09/18/2020 upon evaluation today patient appears to be doing well with regard to her heel ulcer. She is making good progress and I am very pleased with what we see today. I think the Hydrofera Blue is still doing excellent. Objective Constitutional Well-nourished and well-hydrated in no acute distress. Vitals  Time Taken: 9:39 AM, Height: 58 in, Weight: 137 lbs, BMI: 28.6, Temperature: 98.1 F, Pulse: 78 bpm, Respiratory Rate: 16 breaths/min, Blood Pressure: 138/75 mmHg. Respiratory normal breathing without difficulty. Psychiatric this patient is able to make decisions and demonstrates good insight into disease process. Alert and Oriented x 3. pleasant and cooperative. General Notes: Upon inspection patient's wound bed actually showed signs of good granulation epithelization at this point. There does not appear to be any signs of active infection which is great news and overall I am extremely pleased with where things stand today. No fevers, chills, nausea, vomiting, or diarrhea. Integumentary (Hair, Skin) Wound #2 status is Open. Original cause of wound was Pressure Injury. The date acquired was: 04/11/2020. The wound has been in treatment 12 weeks. The wound is located on the Left,Medial Calcaneus. The wound measures 0.9cm length x 0.5cm width x 0.4cm depth; 0.353cm^2 area and 0.141cm^3 volume. There is Fat Layer (Subcutaneous Tissue) exposed. There is no tunneling or undermining noted. There is a medium amount of serosanguineous drainage noted. There is large (67-100%) pink, pale granulation within the wound bed. There is a small (1-33%) amount of necrotic tissue within the wound bed including Adherent Slough. Assessment Active Problems ICD-10 Pressure ulcer of left heel, stage 3 Essential (primary) hypertension Vascular dementia without behavioral disturbance Atherosclerotic heart disease of native coronary artery without angina pectoris Procedures Wound #2 Pre-procedure diagnosis of Wound #2 is a Pressure Ulcer located on the Left,Medial Calcaneus . There was a Three Layer Compression Therapy Procedure by Donnamarie Poag, RN. Post procedure Diagnosis Wound #2: Same as Pre-Procedure [name] [name], [name] ([id number]) Plan Follow-up Appointments: Return Appointment in 2 weeks. - provider Nurse  Visit as needed - week of 8/9-nurse Bathing/ Shower/ Hygiene: May shower; gently cleanse wound with antibacterial soap, rinse and pat dry prior to dressing wounds Non-Wound Condition: Additional non-wound orders/instructions: - Pad dorsal foot with remaining HYDROFERA on top for protection Use foam pad and betadine over pressure area on top medial foot Off-Loading: Other: - Offloading boots while in bed; Try pillow under sheet to keep it in place. WOUND #2: - Calcaneus Wound Laterality: Left, Medial Cleanser: Soap and Water 1 x Per Week/30 Days Discharge Instructions: Gently cleanse wound with antibacterial soap, rinse and pat dry prior to dressing wounds Primary Dressing: Hydrofera Blue Ready Transfer Foam, 2.5x2.5 (in/in) 1 x Per Week/30 Days Discharge Instructions: Apply Hydrofera Blue Ready to wound bed as directed Secondary Dressing: ABD Pad 5x9 (in/in) 1 x Per Week/30 Days Discharge Instructions: Cover with ABD pad. Compression Wrap: Profore Lite LF 3 Multilayer Compression Bandaging System 1 x Per Week/30 Days Discharge Instructions: Apply 3 multi-layer wrap as prescribed. 1. Would recommend currently that we going continue with the wound care measures as before and the patient is in agreement with the plan. This includes the use of the Hydrofera Blue to the wound bed that is doing excellent. 2. I am also can recommend that we continue with padding over the medial/dorsal foot to prevent anything  from rubbing. 3. I am also can recommend the patient continue with the ABD pads as well as the 3 layer compression wrap for the foot region. We will see patient back for reevaluation in 1 week here in the clinic. If anything worsens or changes patient will contact our office for additional recommendations. Electronic Signature(s) Signed: 09/18/2020 10:36:48 AM By: Worthy Keeler PA-C Entered By: Worthy Keeler on 09/18/2020 10:36:48 [name] [name], [name]  ([id number]) -------------------------------------------------------------------------------- SuperBill Details Patient Name: [name] [name], Macayla L. Date of Service: 09/18/2020 Medical Record Number: [id number] Patient Account Number: [id number] Date of Birth/Sex: 04/18/1926 (87 y.o. F) Treating RN: Donnamarie Poag Primary Care Provider: [name] Other Clinician: Referring Provider: [name] Treating Provider/Extender: Skipper Cliche in Treatment: 12 Diagnosis Coding ICD-10 Codes Code Description [phone] Pressure ulcer of left heel, stage 3 I10 Essential (primary) hypertension F01.50 Vascular dementia without behavioral disturbance I25.10 Atherosclerotic heart disease of native coronary artery without angina pectoris Facility Procedures CPT4 Code: [id number] Description: (Facility Use Only) [phone] - [place] Modifier: Quantity: 1 Physician Procedures CPT4 Code: [id number] Description: [id number] - WC PHYS LEVEL 4 - EST PT Modifier: Quantity: 1 CPT4 Code: Description: ICD-10 Diagnosis Description L89.623 Pressure ulcer of left heel, stage 3 I10 Essential (primary) hypertension F01.50 Vascular dementia without behavioral disturbance I25.10 Atherosclerotic heart disease of native coronary 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

## 2020-09-24 ENCOUNTER — Other Ambulatory Visit: Payer: Self-pay | Admitting: Family Medicine

## 2020-09-24 DIAGNOSIS — E039 Hypothyroidism, unspecified: Secondary | ICD-10-CM

## 2020-09-24 NOTE — Telephone Encounter (Signed)
Requested medication (s) are due for refill today: Yes  Requested medication (s) are on the active medication list: No  Last refill:  05/28/20  Future visit scheduled: No  Notes to clinic:  Unable to refill per protocol, 05/28/20 OV provider noted medication added, no updated med profile     Requested Prescriptions  Pending Prescriptions Disp Refills   amLODipine (NORVASC) 2.5 MG tablet [Pharmacy Med Name: AMLODIPINE BESYLATE 2.5 MG TAB] 90 tablet     Sig: TAKE ONE TABLET BY MOUTH EVERY EVENING      Cardiovascular:  Calcium Channel Blockers Passed - 09/24/2020  4:14 PM      Passed - Last BP in normal range    BP Readings from Last 1 Encounters:  09/03/20 112/66          Passed - Valid encounter within last 6 months    Recent Outpatient Visits           3 weeks ago Primary hypertension   [facility] Birdie Sons, MD   3 months ago Hypothyroidism, unspecified type   [facility] Birdie Sons, MD   7 months ago Iron deficiency anemia, unspecified iron deficiency anemia type   [facility] Birdie Sons, MD   8 months ago Iron deficiency anemia, unspecified iron deficiency anemia type   [facility] Birdie Sons, MD   1 year ago Iron deficiency anemia, unspecified iron deficiency anemia type   [facility] Birdie Sons, MD       Future Appointments             In 4 months Fisher, Kirstie Peri, MD [facility]              Signed Prescriptions Disp Refills   levothyroxine (SYNTHROID) 88 MCG tablet 90 tablet 2    Sig: Take 1 tablet (88 mcg total) by mouth daily before breakfast.      Endocrinology:  Hypothyroid Agents Failed - 09/24/2020  4:14 PM      Failed - TSH needs to be rechecked within 3 months after an abnormal result. Refill until TSH is due.      Passed - TSH in normal range and within 360 days    TSH  Date Value Ref Range Status  09/10/2020 4.240  0.450 - 4.500 uIU/mL Final          Passed - Valid encounter within last 12 months    Recent Outpatient Visits           3 weeks ago Primary hypertension   [facility] Birdie Sons, MD   3 months ago Hypothyroidism, unspecified type   [facility] Birdie Sons, MD   7 months ago Iron deficiency anemia, unspecified iron deficiency anemia type   [facility] Birdie Sons, MD   8 months ago Iron deficiency anemia, unspecified iron deficiency anemia type   [facility] Birdie Sons, MD   1 year ago Iron deficiency anemia, unspecified iron deficiency anemia type   [place], [place]       Future Appointments             In 4 months Fisher, Kirstie Peri, MD [facility]               omeprazole (PRILOSEC) 20 MG capsule 90 capsule 2    Sig: TAKE 1 CAPSULE BY MOUTH DAILY  Gastroenterology: Proton Pump Inhibitors Passed - 09/24/2020  4:14 PM      Passed - Valid encounter within last 12 months    Recent Outpatient Visits           3 weeks ago Primary hypertension   [facility] Birdie Sons, MD   3 months ago Hypothyroidism, unspecified type   [facility] Birdie Sons, MD   7 months ago Iron deficiency anemia, unspecified iron deficiency anemia type   [facility] Birdie Sons, MD   8 months ago Iron deficiency anemia, unspecified iron deficiency anemia type   [facility] Birdie Sons, MD   1 year ago Iron deficiency anemia, unspecified iron deficiency anemia type   [facility] Birdie Sons, MD       Future Appointments             In 4 months Fisher, Kirstie Peri, MD [facility]

## 2020-09-25 ENCOUNTER — Other Ambulatory Visit: Payer: Self-pay

## 2020-09-25 DIAGNOSIS — L97519 Non-pressure chronic ulcer of other part of right foot with unspecified severity: Secondary | ICD-10-CM | POA: Diagnosis not present

## 2020-09-25 NOTE — Progress Notes (Signed)
Hopwood, Lakrista L. ([id number]) Visit Report for 09/25/2020 Arrival Information Details Patient Name: [name] [name], [name] Date of Service: 09/25/2020 9:30 AM Medical Record Number: [id number] Patient Account Number: [id number] Date of Birth/Sex: 12/20/1926 (87 y.o. F) Treating RN: Donnamarie Poag Primary Care Sabah Zucco: [name] Other Clinician: Referring Teliah Buffalo: [name] Treating Kaylei Frink/Extender: Skipper Cliche in Treatment: 13 Visit Information History Since Last Visit Added or deleted any medications: No Patient Arrived: Wheel Chair Had a fall or experienced change in No Arrival Time: 09:32 activities of daily living that may affect Accompanied By: self risk of falls: Transfer Assistance: EasyPivot Patient Lift Hospitalized since last visit: No Patient Identification Verified: Yes Has Dressing in Place as Prescribed: Yes Secondary Verification Process Completed: Yes Has Compression in Place as Prescribed: Yes Patient Requires Transmission-Based No Pain Present Now: No Precautions: Patient Has Alerts: Yes Patient Alerts: NOT diabetic ABI R1.18 L1.25 06/27/20 Electronic Signature(s) Signed: 09/25/2020 1:00:34 PM By: Donnamarie Poag Entered ByDonnamarie Poag on 09/25/2020 09:32:40 Jakubek, Timmie L. ([id number]) -------------------------------------------------------------------------------- Clinic Level of Care Assessment Details Patient Name: [name], [name] Date of Service: 09/25/2020 9:30 AM Medical Record Number: [id number] Patient Account Number: [id number] Date of Birth/Sex: December 18, 1926 (87 y.o. F) Treating RN: Donnamarie Poag Primary Care Santina Trillo: [name] Other Clinician: Referring Britaney Espaillat: [name] Treating Lindon Kiel/Extender: Skipper Cliche in Treatment: 13 Clinic Level of Care Assessment Items TOOL 1 Quantity Score '[]'$  - Use when EandM and Procedure is performed on INITIAL visit 0 ASSESSMENTS - Nursing Assessment / Reassessment '[]'$  - General  Physical Exam (combine w/ comprehensive assessment (listed just below) when performed on new 0 pt. evals) '[]'$  - 0 Comprehensive Assessment (HX, ROS, Risk Assessments, Wounds Hx, etc.) ASSESSMENTS - Wound and Skin Assessment / Reassessment '[]'$  - Dermatologic / Skin Assessment (not related to wound area) 0 ASSESSMENTS - Ostomy and/or Continence Assessment and Care '[]'$  - Incontinence Assessment and Management 0 '[]'$  - 0 Ostomy Care Assessment and Management (repouching, etc.) PROCESS - Coordination of Care '[]'$  - Simple Patient / Family Education for ongoing care 0 '[]'$  - 0 Complex (extensive) Patient / Family Education for ongoing care '[]'$  - 0 Staff obtains Programmer, systems, Records, Test Results / Process Orders '[]'$  - 0 Staff telephones HHA, Nursing Homes / Clarify orders / etc '[]'$  - 0 Routine Transfer to another Facility (non-emergent condition) '[]'$  - 0 Routine Hospital Admission (non-emergent condition) '[]'$  - 0 New Admissions / Biomedical engineer / Ordering NPWT, Apligraf, etc. '[]'$  - 0 Emergency Hospital Admission (emergent condition) PROCESS - Special Needs '[]'$  - Pediatric / Minor Patient Management 0 '[]'$  - 0 Isolation Patient Management '[]'$  - 0 Hearing / Language / Visual special needs '[]'$  - 0 Assessment of Community assistance (transportation, D/C planning, etc.) '[]'$  - 0 Additional assistance / Altered mentation '[]'$  - 0 Support Surface(s) Assessment (bed, cushion, seat, etc.) INTERVENTIONS - Miscellaneous '[]'$  - External ear exam 0 '[]'$  - 0 Patient Transfer (multiple staff / Civil Service fast streamer / Similar devices) '[]'$  - 0 Simple Staple / Suture removal (25 or less) '[]'$  - 0 Complex Staple / Suture removal (26 or more) '[]'$  - 0 Hypo/Hyperglycemic Management (do not check if billed separately) '[]'$  - 0 Ankle / Brachial Index (ABI) - do not check if billed separately Has the patient been seen at the hospital within the last three years: Yes Total Score: 0 Level Of Care: ____ Tennis, Ty L.  ([id number]) Electronic Signature(s) Signed: 09/25/2020 1:00:34 PM By: Donnamarie Poag Entered By: Donnamarie Poag on 09/25/2020 09:44:36 Salatino, [place] (  [id number]) -------------------------------------------------------------------------------- Compression Therapy Details Patient Name: [name], [name] Date of Service: 09/25/2020 9:30 AM Medical Record Number: [id number] Patient Account Number: [id number] Date of Birth/Sex: 1926-06-30 (87 y.o. F) Treating RN: Donnamarie Poag Primary Care Finch Costanzo: [name] Other Clinician: Referring Rozella Servello: [name] Treating Rowan Blaker/Extender: Skipper Cliche in Treatment: 13 Compression Therapy Performed for Wound Assessment: Wound #2 Left,Medial Calcaneus Performed By: Clinician Donnamarie Poag, RN Compression Type: Three Layer Electronic Signature(s) Signed: 09/25/2020 1:00:34 PM By: Donnamarie Poag Entered By: Donnamarie Poag on 09/25/2020 09:34:00 Bally, [place] ([id number]) -------------------------------------------------------------------------------- Encounter Discharge Information Details Patient Name: [name], [name] Date of Service: 09/25/2020 9:30 AM Medical Record Number: [id number] Patient Account Number: [id number] Date of Birth/Sex: March 31, 1926 (87 y.o. F) Treating RN: Donnamarie Poag Primary Care Danial Sisley: [name] Other Clinician: Referring Cloyce Paterson: [name] Treating Elai Vanwyk/Extender: Skipper Cliche in Treatment: 13 Encounter Discharge Information Items Discharge Condition: Stable Ambulatory Status: Wheelchair Discharge Destination: Home Transportation: Private Auto Accompanied By: daughter Schedule Follow-up Appointment: Yes Clinical Summary of Care: Electronic Signature(s) Signed: 09/25/2020 1:00:34 PM By: Donnamarie Poag Entered ByDonnamarie Poag on 09/25/2020 09:44:29 Cassaday, Aurorah L. ([id number]) -------------------------------------------------------------------------------- Wound Assessment Details Patient Name:  [name], [name] Date of Service: 09/25/2020 9:30 AM Medical Record Number: [id number] Patient Account Number: [id number] Date of Birth/Sex: June 06, 1926 (87 y.o. F) Treating RN: Donnamarie Poag Primary Care Tashala Cumbo: [name] Other Clinician: Referring Gotti Alwin: [name] Treating Grove Defina/Extender: Skipper Cliche in Treatment: 13 Wound Status Wound Number: 2 Primary Pressure Ulcer Etiology: Wound Location: Left, Medial Calcaneus Wound Open Wounding Event: Pressure Injury Status: Date Acquired: 04/11/2020 Comorbid Cataracts, Anemia, Coronary Artery Disease, Weeks Of Treatment: 13 History: Hypertension, Myocardial Infarction, End Stage Renal Clustered Wound: No Disease, Gout, Osteoarthritis, Dementia Wound Measurements Length: (cm) 0.9 Width: (cm) 0.5 Depth: (cm) 0.4 Area: (cm) 0.353 Volume: (cm) 0.141 % Reduction in Area: 70% % Reduction in Volume: 70.1% Epithelialization: None Wound Description Classification: Category/Stage III Exudate Amount: Medium Exudate Type: Serosanguineous Exudate Color: red, brown Foul Odor After Cleansing: No Slough/Fibrino Yes Wound Bed Granulation Amount: Large (67-100%) Exposed Structure Granulation Quality: Pink, Pale Fascia Exposed: No Necrotic Amount: Small (1-33%) Fat Layer (Subcutaneous Tissue) Exposed: Yes Necrotic Quality: Adherent Slough Tendon Exposed: No Muscle Exposed: No Joint Exposed: No Bone Exposed: No Treatment Notes Wound #2 (Calcaneus) Wound Laterality: Left, Medial Cleanser Soap and Water Discharge Instruction: Gently cleanse wound with antibacterial soap, rinse and pat dry prior to dressing wounds Peri-Wound Care Topical Primary Dressing Hydrofera Blue Ready Transfer Foam, 2.5x2.5 (in/in) Discharge Instruction: Apply Hydrofera Blue Ready to wound bed as directed Secondary Dressing ABD Pad 5x9 (in/in) Discharge Instruction: Cover with ABD pad. Secured With Compression Wrap Profore Lite LF 3  Multilayer Compression Bandaging System [employer], Jaleesa L. ([id number]) Discharge Instruction: Apply 3 multi-layer wrap as prescribed. Compression Stockings Add-Ons Electronic Signature(s) Signed: 09/25/2020 1:00:34 PM By: Donnamarie Poag Entered ByDonnamarie Poag on 09/25/2020 09:33:46

## 2020-10-02 ENCOUNTER — Other Ambulatory Visit: Payer: Self-pay

## 2020-10-02 ENCOUNTER — Encounter: Payer: PPO | Admitting: Physician Assistant

## 2020-10-02 DIAGNOSIS — L89893 Pressure ulcer of other site, stage 3: Secondary | ICD-10-CM | POA: Diagnosis not present

## 2020-10-02 DIAGNOSIS — L97519 Non-pressure chronic ulcer of other part of right foot with unspecified severity: Secondary | ICD-10-CM | POA: Diagnosis not present

## 2020-10-02 DIAGNOSIS — L89623 Pressure ulcer of left heel, stage 3: Secondary | ICD-10-CM | POA: Diagnosis not present

## 2020-10-02 NOTE — Progress Notes (Addendum)
Rallo, Madolin L. ([id number]) Visit Report for 10/02/2020 Chief Complaint Document Details Patient Name: [name] [name], [name] Date of Service: 10/02/2020 12:30 PM Medical Record Number: [id number] Patient Account Number: [id number] Date of Birth/Sex: 09/05/1926 (87 y.o. F) Treating RN: Donnamarie Poag Primary Care Provider: [name] Other Clinician: Referring Provider: [name] Treating Provider/Extender: Skipper Cliche in Treatment: 14 Information Obtained from: Patient Chief Complaint Left Heel Pressure Ulcer Electronic Signature(s) Signed: 10/02/2020 12:51:04 PM By: Worthy Keeler PA-C Entered By: Worthy Keeler on 10/02/2020 12:51:04 [name] [name], [name] ([id number]) -------------------------------------------------------------------------------- Debridement Details Patient Name: [name], [name] Date of Service: 10/02/2020 12:30 PM Medical Record Number: [id number] Patient Account Number: [id number] Date of Birth/Sex: 07-Dec-1926 (87 y.o. F) Treating RN: Donnamarie Poag Primary Care Provider: [name] Other Clinician: Referring Provider: [name] Treating Provider/Extender: Skipper Cliche in Treatment: 14 Debridement Performed for Wound #2 Left,Medial Calcaneus Assessment: Performed By: Physician Tommie Sams., PA-C Debridement Type: Debridement Level of Consciousness (Pre- Awake and Alert procedure): Pre-procedure Verification/Time Out Yes - 12:53 Taken: Start Time: 12:53 Pain Control: Lidocaine Total Area Debrided (L x W): 0.5 (cm) x 0.4 (cm) = 0.2 (cm) Tissue and other material Viable, Non-Viable, Callus, Slough, Subcutaneous, Slough debrided: Level: Skin/Subcutaneous Tissue Debridement Description: Excisional Instrument: Curette Bleeding: Minimum Hemostasis Achieved: Pressure End Time: 12:55 Response to Treatment: Procedure was tolerated well Level of Consciousness (Post- Awake and Alert procedure): Post Debridement Measurements of  Total Wound Length: (cm) 0.4 Stage: Category/Stage III Width: (cm) 0.3 Depth: (cm) 0.4 Volume: (cm) 0.038 Character of Wound/Ulcer Post Debridement: Improved Post Procedure Diagnosis Same as Pre-procedure Electronic Signature(s) Signed: 10/02/2020 2:08:45 PM By: Donnamarie Poag Signed: 10/04/2020 4:48:07 PM By: Worthy Keeler PA-C Entered By: Donnamarie Poag on 10/02/2020 12:55:06 [name], [name] ([id number]) -------------------------------------------------------------------------------- Debridement Details Patient Name: [name], [name] Date of Service: 10/02/2020 12:30 PM Medical Record Number: [id number] Patient Account Number: [id number] Date of Birth/Sex: May 03, 1926 (87 y.o. F) Treating RN: Donnamarie Poag Primary Care Provider: [name] Other Clinician: Referring Provider: [name] Treating Provider/Extender: Skipper Cliche in Treatment: 14 Debridement Performed for Wound #3 Left Foot Assessment: Performed By: Physician Tommie Sams., PA-C Debridement Type: Debridement Level of Consciousness (Pre- Awake and Alert procedure): Pre-procedure Verification/Time Out Yes - 12:53 Taken: Start Time: 12:56 Pain Control: Lidocaine Total Area Debrided (L x W): 0.8 (cm) x 0.5 (cm) = 0.4 (cm) Tissue and other material Viable, Non-Viable, Eschar, Slough, Subcutaneous, Slough debrided: Level: Skin/Subcutaneous Tissue Debridement Description: Excisional Instrument: Curette Bleeding: Minimum Hemostasis Achieved: Pressure End Time: 13:00 Response to Treatment: Procedure was tolerated well Level of Consciousness (Post- Awake and Alert procedure): Post Debridement Measurements of Total Wound Length: (cm) 0.8 Stage: Category/Stage III Width: (cm) 0.5 Depth: (cm) 0.5 Volume: (cm) 0.157 Character of Wound/Ulcer Post Debridement: Improved Post Procedure Diagnosis Same as Pre-procedure Electronic Signature(s) Signed: 10/02/2020 2:08:45 PM By: Donnamarie Poag Signed:  10/04/2020 4:48:07 PM By: Worthy Keeler PA-C Entered By: Donnamarie Poag on 10/02/2020 13:03:16 [name] [name], [name] ([id number]) -------------------------------------------------------------------------------- HPI Details Patient Name: [name] [name], [name] Date of Service: 10/02/2020 12:30 PM Medical Record Number: [id number] Patient Account Number: [id number] Date of Birth/Sex: May 30, 1926 (87 y.o. F) Treating RN: Donnamarie Poag Primary Care Provider: [name] Other Clinician: Referring Provider: [name] Treating Provider/Extender: Skipper Cliche in Treatment: 14 History of Present Illness HPI Description: 87 year old patient was recently seen by the PCPs office for significant pain right great toe which has been going on since July. She was initially treated with Keflex  which she did not complete and after the office visit this time she has been put on doxycycline. at the time of her visit she was found to have a ulcer on the plantar surface of the right great toe and also had a pyogenic granuloma over this area. X-ray of the right foot done 12/29/2014 -- IMPRESSION:Soft-tissue swelling and ulceration right great toe. No underlying bony lytic lesion identified. If osteomyelitis remains of clinical concern MRI can be obtained. Past medical history significant for anemia, chronic kidney disease stage III, obesity, varicose veins, coronary artery disease, gout, history of nicotine addiction given up smoking in 2002, hypertension, status post cardiac catheterization, status post abdominal hysterectomy, cholecystectomy and tonsillectomy. hemoglobin A1c done in August was 5.8 01/22/2015 -- at this stage the [facility] Walking boat was going to cost them significant amount of money and they want to defer using that at the present time. 01/29/2015 -- she had a podiatry appointment and they have trimmed her toenails. She has not heard back from the vascular office regarding her venous duplex study and  I have asked them to call personally so that they can get the appointment soon. 02/06/2015 -- they have made contact with the vascular office and from what I understand that test has been done but the report is pending. 02/19/2014 -- the vascular test is scheduled for tomorrow Readmission: 06/26/2020 upon evaluation today patient appears for initial evaluation in her clinic that she has been here before in 2016 and has been quite sometime. She did have a fractured hip in February on the 23rd 2022. Subsequently this had to be pinned and she ended up with a pressure injury on her heel following when she was using her foot to help move her around in the bed. Subsequently this has led to the wound that she has been dealing with since that time. She lives at home with her daughter currently she does have dementia. 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 07/03/2020 upon evaluation today patient appears to be doing well with regard to her heel ulcer. She did have arterial studies they appear to be doing excellent it was premature normal across the board with TBI's in the 90s and ABIs well within normal range. Nonetheless there does not appear to be any signs of arterial insufficiency whatsoever. With that being said the patient does have also signs of improvement there is some necrotic tissue in the base of the wound number to try to clear some of that away today I do believe the Iodoflex/Iodosorb is doing well. 5/24; difficult punched out wound on the left medial heel. She has been using Iodoflex 07/17/2020 upon evaluation today patient appears to be doing well at this point in regard to her wound. I do feel like this is a little bit deeper but again that is what is expected as we continue with the Iodoflex I think that this is just going to get deeper until we get to the  base of the wound. With that being said I think we are getting much closer to the base of the wound where we can have healthier tissue that we get a be managing here which that will be awesome. In the meantime I am not surprised by what I am seeing and in fact the wound appears to be better as compared to previous findings. 07/24/2020 upon evaluation today patient appears to  be doing well with regard to her wound. Overall I am extremely pleased with where things stand today. I do not see any signs of active infection which is great and overall I think that the patient is making good progress. There is good to be some need for sharp debridement today. 07/31/2020 upon evaluation today patient appears to be doing better in regard to her heel ulcer. She has been tolerating the dressing changes without complication. Fortunately there does not appear to be any signs of active infection which is great and overall very pleased in this regard. No fevers, chills, nausea, vomiting, or diarrhea. 08/14/2020 upon evaluation today patient appears to be doing better in regard to her heel ulcer. I am very pleased in that regard. Unfortunately she still has a slight deep tissue injury in regard to the medial portion of her foot over the same area. Unfortunately I think this is something that if were not careful it was can open up into the wound. That is my main concern here based on what I see. Fortunately there does not appear to be any evidence of active infection which is great news and overall very pleased with where things stand at this point. 08/21/2020 upon evaluation today patient appears to be doing well with regard to her wound. She has been tolerating the dressing changes without complication. Fortunately there does not appear to be any signs of active infection at this time. No fevers, chills, nausea, vomiting, or diarrhea. I do believe the compression wrap was beneficial for her. 08/28/2020 upon evaluation  today patient appears to be doing well with regard to her wounds currently. Fortunately there does not appear to be any signs of active infection at this time. No fevers, chills, nausea, vomiting, or diarrhea. With that being said I think that her leg is doing quite well to be honest. 09/04/2020 upon evaluation today patient appears to be doing well with regard to her wound on the heel. This is showing signs of good epithelial growth although there is probably can it be an indention where this heals that is okay as long as we get it closed. Fortunately I do not see any evidence of infection at this point. 09/13/2020 upon evaluation today patient appears to be doing well with regard to her wounds. She has been tolerating the dressing changes Wegner, Oaklee L. ([id number]) without complication. Fortunately he is actually doing extremely well and I think she is making great progress this is measuring smaller. I would recommend that such that we continue with the [facility] likely since he is doing so well. 09/18/2020 upon evaluation today patient appears to be doing well with regard to her heel ulcer. She is making good progress and I am very pleased with what we see today. I think the Hydrofera Blue is still doing excellent. 10/02/2020 upon evaluation today patient's wound actually appears to be showing signs of good improvement in regard to the heel. I am very pleased in this regard. With that being said I do believe that the area which is somewhat been stable and dry is starting to lift up and beginning to clear this away as well that is on the foot. Subsequently I am going to have to perform some debridement here and we did actually go ahead and allow this as a wound today as before it was just more deep tissue injury and eschar covering now I think it is a little bit more than that to be honest. Electronic Signature(s) Signed:  10/02/2020 5:41:26 PM By: Worthy Keeler PA-C Entered By: Worthy Keeler on 10/02/2020 17:41:26 [name] [name], [name] ([id number]) -------------------------------------------------------------------------------- Physical Exam Details Patient Name: Gavia, Nkechi L. Date of Service: 10/02/2020 12:30 PM Medical Record Number: [id number] Patient Account Number: [id number] Date of Birth/Sex: 1926/02/28 (87 y.o. F) Treating RN: Donnamarie Poag Primary Care Provider: [name] Other Clinician: Referring Provider: [name] Treating Provider/Extender: Skipper Cliche in Treatment: 71 Constitutional Well-nourished and well-hydrated in no acute distress. Respiratory normal breathing without difficulty. Psychiatric this patient is able to make decisions and demonstrates good insight into disease process. Alert and Oriented x 3. pleasant and cooperative. Notes Upon inspection patient's wound on the heel did require some debridement this went quite well and I am very pleased. In regard to the foot on the medial aspect of her left foot that did require sharp debridement today as well this appears to be a stage III pressure ulcer at this point although it does seem much cleaner and there is no signs of infection at this time. Electronic Signature(s) Signed: 10/02/2020 5:41:56 PM By: Worthy Keeler PA-C Entered By: Worthy Keeler on 10/02/2020 17:41:56 [name] [name], [name] ([id number]) -------------------------------------------------------------------------------- Physician Orders Details Patient Name: [name] [name], [name] Date of Service: 10/02/2020 12:30 PM Medical Record Number: [id number] Patient Account Number: [id number] Date of Birth/Sex: 01-Aug-1926 (87 y.o. F) Treating RN: Donnamarie Poag Primary Care Provider: [name] Other Clinician: Referring Provider: [name] Treating Provider/Extender: Skipper Cliche in Treatment: 14 Verbal / Phone Orders: No Diagnosis Coding ICD-10 Coding Code Description [phone] Pressure ulcer of left heel, stage  3 I10 Essential (primary) hypertension F01.50 Vascular dementia without behavioral disturbance I25.10 Atherosclerotic heart disease of native coronary artery without angina pectoris Follow-up Appointments o Return Appointment in 1 week. o Nurse Visit as needed Bathing/ Shower/ Hygiene o May shower; gently cleanse wound with antibacterial soap, rinse and pat dry prior to dressing wounds Off-Loading o Other: - Offloading boots while in bed; Try pillow under sheet to keep it in place. Wound Treatment Wound #2 - Calcaneus Wound Laterality: Left, Medial Cleanser: Soap and Water 1 x Per Week/30 Days Discharge Instructions: Gently cleanse wound with antibacterial soap, rinse and pat dry prior to dressing wounds Primary Dressing: Hydrofera Blue Ready Transfer Foam, 2.5x2.5 (in/in) 1 x Per Week/30 Days Discharge Instructions: Apply Hydrofera Blue Ready to wound bed as directed Secondary Dressing: ABD Pad 5x9 (in/in) 1 x Per Week/30 Days Discharge Instructions: Cover with ABD pad. Compression Wrap: Profore Lite LF 3 Multilayer Compression Bandaging System 1 x Per Week/30 Days Discharge Instructions: Apply 3 multi-layer wrap as prescribed. Wound #3 - Foot Wound Laterality: Left Cleanser: Soap and Water 1 x Per Week/30 Days Discharge Instructions: Gently cleanse wound with antibacterial soap, rinse and pat dry prior to dressing wounds Primary Dressing: Iodosorb 40 (g) 1 x Per Week/30 Days Discharge Instructions: Apply IodoSorb to wound bed only as directed. Secondary Dressing: ABD Pad 5x9 (in/in) 1 x Per Week/30 Days Discharge Instructions: Cover with ABD pad Compression Wrap: Profore Lite LF 3 Multilayer Compression Bandaging System 1 x Per Week/30 Days Discharge Instructions: Apply 3 multi-layer wrap as prescribed. Electronic Signature(s) Signed: 10/02/2020 2:08:45 PM By: Donnamarie Poag Signed: 10/04/2020 4:48:07 PM By: Worthy Keeler PA-C Entered By: Donnamarie Poag on 10/02/2020  13:04:19 [name] [name], [name] ([id number]) -------------------------------------------------------------------------------- Problem List Details Patient Name: [name] [name], [name] Date of Service: 10/02/2020 12:30 PM Medical Record Number: [id number] Patient Account Number: [id number] Date of Birth/Sex: Jul 20, 1926 (87 y.o. F)  Treating RN: Donnamarie Poag Primary Care Provider: [name] Other Clinician: Referring Provider: [name] Treating Provider/Extender: Skipper Cliche in Treatment: 14 Active Problems ICD-10 Encounter Code Description Active Date MDM Diagnosis [phone] Pressure ulcer of left heel, stage 3 06/26/2020 No Yes L89.893 Pressure ulcer of other site, stage 3 10/02/2020 No Yes I10 Essential (primary) hypertension 06/26/2020 No Yes F01.50 Vascular dementia without behavioral disturbance 06/26/2020 No Yes I25.10 Atherosclerotic heart disease of native coronary artery without angina 06/26/2020 No Yes pectoris Inactive Problems Resolved Problems Electronic Signature(s) Signed: 10/02/2020 5:41:15 PM By: Worthy Keeler PA-C Previous Signature: 10/02/2020 12:50:55 PM Version By: Worthy Keeler PA-C Entered By: Worthy Keeler on 10/02/2020 17:41:15 [name] [name], [name] ([id number]) -------------------------------------------------------------------------------- Progress Note Details Patient Name: [name] [name], [name] Date of Service: 10/02/2020 12:30 PM Medical Record Number: [id number] Patient Account Number: [id number] Date of Birth/Sex: [birth year] (87 y.o. F) Treating RN: Donnamarie Poag Primary Care Provider: [name] Other Clinician: Referring Provider: [name] Treating Provider/Extender: Skipper Cliche in Treatment: 14 Subjective Chief Complaint Information obtained from Patient Left Heel Pressure Ulcer History of Present Illness (HPI) 87 year old patient was recently seen by the PCPs office for significant pain right great toe which has been going on since  July. She was initially treated with Keflex which she did not complete and after the office visit this time she has been put on doxycycline. at the time of her visit she was found to have a ulcer on the plantar surface of the right great toe and also had a pyogenic granuloma over this area. X-ray of the right foot done 12/29/2014 -- IMPRESSION:Soft-tissue swelling and ulceration right great toe. No underlying bony lytic lesion identified. If osteomyelitis remains of clinical concern MRI can be obtained. Past medical history significant for anemia, chronic kidney disease stage III, obesity, varicose veins, coronary artery disease, gout, history of nicotine addiction given up smoking in 2002, hypertension, status post cardiac catheterization, status post abdominal hysterectomy, cholecystectomy and tonsillectomy. hemoglobin A1c done in August was 5.8 01/22/2015 -- at this stage the [facility] Walking boat was going to cost them significant amount of money and they want to defer using that at the present time. 01/29/2015 -- she had a podiatry appointment and they have trimmed her toenails. She has not heard back from the vascular office regarding her venous duplex study and I have asked them to call personally so that they can get the appointment soon. 02/06/2015 -- they have made contact with the vascular office and from what I understand that test has been done but the report is pending. 02/19/2014 -- the vascular test is scheduled for tomorrow Readmission: 06/26/2020 upon evaluation today patient appears for initial evaluation in her clinic that she has been here before in 2016 and has been quite sometime. She did have a fractured hip in February on the 23rd 2022. Subsequently this had to be pinned and she ended up with a pressure injury on her heel following when she was using her foot to help move her around in the bed. Subsequently this has led to the wound that she has been dealing with since that time.  She lives at home with her daughter currently she does have dementia. 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 07/03/2020 upon evaluation today patient appears to be doing well with regard to her heel ulcer. She did have arterial studies they  appear to be doing excellent it was premature normal across the board with TBI's in the 90s and ABIs well within normal range. Nonetheless there does not appear to be any signs of arterial insufficiency whatsoever. With that being said the patient does have also signs of improvement there is some necrotic tissue in the base of the wound number to try to clear some of that away today I do believe the Iodoflex/Iodosorb is doing well. 5/24; difficult punched out wound on the left medial heel. She has been using Iodoflex 07/17/2020 upon evaluation today patient appears to be doing well at this point in regard to her wound. I do feel like this is a little bit deeper but again that is what is expected as we continue with the Iodoflex I think that this is just going to get deeper until we get to the base of the wound. With that being said I think we are getting much closer to the base of the wound where we can have healthier tissue that we get a be managing here which that will be awesome. In the meantime I am not surprised by what I am seeing and in fact the wound appears to be better as compared to previous findings. 07/24/2020 upon evaluation today patient appears to be doing well with regard to her wound. Overall I am extremely pleased with where things stand today. I do not see any signs of active infection which is great and overall I think that the patient is making good progress. There is good to be some need for sharp debridement today. 07/31/2020 upon evaluation today patient appears to be doing better in regard to her heel ulcer.  She has been tolerating the dressing changes without complication. Fortunately there does not appear to be any signs of active infection which is great and overall very pleased in this regard. No fevers, chills, nausea, vomiting, or diarrhea. 08/14/2020 upon evaluation today patient appears to be doing better in regard to her heel ulcer. I am very pleased in that regard. Unfortunately she still has a slight deep tissue injury in regard to the medial portion of her foot over the same area. Unfortunately I think this is something that if were not careful it was can open up into the wound. That is my main concern here based on what I see. Fortunately there does not appear to be any evidence of active infection which is great news and overall very pleased with where things stand at this point. 08/21/2020 upon evaluation today patient appears to be doing well with regard to her wound. She has been tolerating the dressing changes without complication. Fortunately there does not appear to be any signs of active infection at this time. No fevers, chills, nausea, vomiting, or diarrhea. I do believe the compression wrap was beneficial for her. 08/28/2020 upon evaluation today patient appears to be doing well with regard to her wounds currently. Fortunately there does not appear to be any signs of active infection at this time. No fevers, chills, nausea, vomiting, or diarrhea. With that being said I think that her leg is doing quite well to be honest. [name] [name], [name] ([id number]) 09/04/2020 upon evaluation today patient appears to be doing well with regard to her wound on the heel. This is showing signs of good epithelial growth although there is probably can it be an indention where this heals that is okay as long as we get it closed. Fortunately I do not see any evidence of infection  at this point. 09/13/2020 upon evaluation today patient appears to be doing well with regard to her wounds. She has been  tolerating the dressing changes without complication. Fortunately he is actually doing extremely well and I think she is making great progress this is measuring smaller. I would recommend that such that we continue with the [facility] likely since he is doing so well. 09/18/2020 upon evaluation today patient appears to be doing well with regard to her heel ulcer. She is making good progress and I am very pleased with what we see today. I think the Hydrofera Blue is still doing excellent. 10/02/2020 upon evaluation today patient's wound actually appears to be showing signs of good improvement in regard to the heel. I am very pleased in this regard. With that being said I do believe that the area which is somewhat been stable and dry is starting to lift up and beginning to clear this away as well that is on the foot. Subsequently I am going to have to perform some debridement here and we did actually go ahead and allow this as a wound today as before it was just more deep tissue injury and eschar covering now I think it is a little bit more than that to be honest. Objective Constitutional Well-nourished and well-hydrated in no acute distress. Vitals Time Taken: 12:36 PM, Height: 58 in, Weight: 137 lbs, BMI: 28.6, Temperature: 97.6 F, Pulse: 81 bpm, Respiratory Rate: 16 breaths/min, Blood Pressure: 151/79 mmHg. Respiratory normal breathing without difficulty. Psychiatric this patient is able to make decisions and demonstrates good insight into disease process. Alert and Oriented x 3. pleasant and cooperative. General Notes: Upon inspection patient's wound on the heel did require some debridement this went quite well and I am very pleased. In regard to the foot on the medial aspect of her left foot that did require sharp debridement today as well this appears to be a stage III pressure ulcer at this point although it does seem much cleaner and there is no signs of infection at this  time. Integumentary (Hair, Skin) Wound #2 status is Open. Original cause of wound was Pressure Injury. The date acquired was: 04/11/2020. The wound has been in treatment 14 weeks. The wound is located on the Left,Medial Calcaneus. The wound measures 0.4cm length x 0.3cm width x 0.4cm depth; 0.094cm^2 area and 0.038cm^3 volume. There is Fat Layer (Subcutaneous Tissue) exposed. There is no tunneling or undermining noted. There is a medium amount of serosanguineous drainage noted. There is large (67-100%) pink, pale granulation within the wound bed. There is a small (1-33%) amount of necrotic tissue within the wound bed including Adherent Slough. Wound #3 status is Open. Original cause of wound was Pressure Injury. The date acquired was: 10/02/2020. The wound is located on the Left Foot. The wound measures 0.8cm length x 0.5cm width x 0.1cm depth; 0.314cm^2 area and 0.031cm^3 volume. There is Fat Layer (Subcutaneous Tissue) exposed. There is no tunneling or undermining noted. There is a medium amount of serosanguineous drainage noted. The wound margin is thickened. There is no granulation within the wound bed. There is a large (67-100%) amount of necrotic tissue within the wound bed including Eschar. Assessment Active Problems ICD-10 Pressure ulcer of left heel, stage 3 Pressure ulcer of other site, stage 3 Essential (primary) hypertension Vascular dementia without behavioral disturbance Atherosclerotic heart disease of native coronary artery without angina pectoris [name] [name], [name] ([id number]) Procedures Wound #2 Pre-procedure diagnosis of Wound #2 is a Pressure Ulcer  located on the Left,Medial Calcaneus . There was a Excisional Skin/Subcutaneous Tissue Debridement with a total area of 0.2 sq cm performed by Tommie Sams., PA-C. With the following instrument(s): Curette to remove Viable and Non-Viable tissue/material. Material removed includes Callus, Subcutaneous Tissue, and Slough after  achieving pain control using Lidocaine. A time out was conducted at 12:53, prior to the start of the procedure. A Minimum amount of bleeding was controlled with Pressure. The procedure was tolerated well. Post Debridement Measurements: 0.4cm length x 0.3cm width x 0.4cm depth; 0.038cm^3 volume. Post debridement Stage noted as Category/Stage III. Character of Wound/Ulcer Post Debridement is improved. Post procedure Diagnosis Wound #2: Same as Pre-Procedure Pre-procedure diagnosis of Wound #2 is a Pressure Ulcer located on the Left,Medial Calcaneus . There was a Three Layer Compression Therapy Procedure by Donnamarie Poag, RN. Post procedure Diagnosis Wound #2: Same as Pre-Procedure Wound #3 Pre-procedure diagnosis of Wound #3 is a Pressure Ulcer located on the Left Foot . There was a Excisional Skin/Subcutaneous Tissue Debridement with a total area of 0.4 sq cm performed by Tommie Sams., PA-C. With the following instrument(s): Curette to remove Viable and Non-Viable tissue/material. Material removed includes Eschar, Subcutaneous Tissue, and Slough after achieving pain control using Lidocaine. A time out was conducted at 12:53, prior to the start of the procedure. A Minimum amount of bleeding was controlled with Pressure. The procedure was tolerated well. Post Debridement Measurements: 0.8cm length x 0.5cm width x 0.5cm depth; 0.157cm^3 volume. Post debridement Stage noted as Category/Stage III. Character of Wound/Ulcer Post Debridement is improved. Post procedure Diagnosis Wound #3: Same as Pre-Procedure Plan Follow-up Appointments: Return Appointment in 1 week. Nurse Visit as needed Bathing/ Shower/ Hygiene: May shower; gently cleanse wound with antibacterial soap, rinse and pat dry prior to dressing wounds Off-Loading: Other: - Offloading boots while in bed; Try pillow under sheet to keep it in place. WOUND #2: - Calcaneus Wound Laterality: Left, Medial Cleanser: Soap and Water 1 x Per  Week/30 Days Discharge Instructions: Gently cleanse wound with antibacterial soap, rinse and pat dry prior to dressing wounds Primary Dressing: Hydrofera Blue Ready Transfer Foam, 2.5x2.5 (in/in) 1 x Per Week/30 Days Discharge Instructions: Apply Hydrofera Blue Ready to wound bed as directed Secondary Dressing: ABD Pad 5x9 (in/in) 1 x Per Week/30 Days Discharge Instructions: Cover with ABD pad. Compression Wrap: Profore Lite LF 3 Multilayer Compression Bandaging System 1 x Per Week/30 Days Discharge Instructions: Apply 3 multi-layer wrap as prescribed. WOUND #3: - Foot Wound Laterality: Left Cleanser: Soap and Water 1 x Per Week/30 Days Discharge Instructions: Gently cleanse wound with antibacterial soap, rinse and pat dry prior to dressing wounds Primary Dressing: Iodosorb 40 (g) 1 x Per Week/30 Days Discharge Instructions: Apply IodoSorb to wound bed only as directed. Secondary Dressing: ABD Pad 5x9 (in/in) 1 x Per Week/30 Days Discharge Instructions: Cover with ABD pad Compression Wrap: Profore Lite LF 3 Multilayer Compression Bandaging System 1 x Per Week/30 Days Discharge Instructions: Apply 3 multi-layer wrap as prescribed. 1. Would recommend currently that we actually initiate Iodosorb treatment for the left foot I think that is can be the best way to go to help clean out this and get it in a better spot. 2. We will get a continue with Hydrofera Blue for the heel which I think is also doing well. 3. I would recommend that we continue with ABD pads to cover followed by 3 layer compression wrap. We will see patient back for reevaluation in 1 week  here in the clinic. If anything worsens or changes patient will contact our office for additional recommendations. Electronic Signature(s) Signed: 10/02/2020 5:42:29 PM By: Worthy Keeler PA-C Vanmaanen, Dontavia L. ([id number]) Entered By: Worthy Keeler on 10/02/2020 17:42:29 Ferrucci, Minal L.  ([id number]) -------------------------------------------------------------------------------- SuperBill Details Patient Name: Reinhardt, Kerin L. Date of Service: 10/02/2020 Medical Record Number: [id number] Patient Account Number: [id number] Date of Birth/Sex: 09-21-1926 (87 y.o. F) Treating RN: Donnamarie Poag Primary Care Provider: [name] Other Clinician: Referring Provider: [name] Treating Provider/Extender: Skipper Cliche in Treatment: 14 Diagnosis Coding ICD-10 Codes Code Description [phone] Pressure ulcer of left heel, stage 3 L89.893 Pressure ulcer of other site, stage 3 I10 Essential (primary) hypertension F01.50 Vascular dementia without behavioral disturbance I25.10 Atherosclerotic heart disease of native coronary artery without angina pectoris Facility Procedures CPT4 Code: [id number] Description: [id number] - DEB SUBQ TISSUE 20 SQ CM/< Modifier: Quantity: 1 CPT4 Code: Description: ICD-10 Diagnosis Description L89.623 Pressure ulcer of left heel, stage 3 L89.893 Pressure ulcer of other site, stage 3 Modifier: Quantity: Physician Procedures CPT4 Code: [id number] Description: 11042 - WC PHYS SUBQ TISS 20 SQ CM Modifier: Quantity: 1 CPT4 Code: Description: ICD-10 Diagnosis Description L89.623 Pressure ulcer of left heel, stage 3 L89.893 Pressure ulcer of other site, stage 3 Modifier: Quantity: Electronic Signature(s) Signed: 10/02/2020 5:43:14 PM By: Worthy Keeler PA-C Previous Signature: 10/02/2020 2:08:45 PM Version By: Donnamarie Poag Entered By: Worthy Keeler on 10/02/2020 17:43:13

## 2020-10-02 NOTE — Progress Notes (Signed)
[name] [name], [name] ([id number]) Visit Report for 10/02/2020 Arrival Information Details Patient Name: [name] Date of Service: 10/02/2020 12:30 PM Medical Record Number: [id number] Patient Account Number: [id number] Date of Birth/Sex: 1926/06/12 (87 y.o. F) Treating RN: Donnamarie Poag Primary Care Tye Vigo: [name] Other Clinician: Referring Kaiden Dardis: [name] Treating Elke Holtry/Extender: Skipper Cliche in Treatment: 14 Visit Information History Since Last Visit Added or deleted any medications: No Patient Arrived: Wheel Chair Had a fall or experienced change in No Arrival Time: 12:34 activities of daily living that may affect Accompanied By: daughter risk of falls: Transfer Assistance: None Hospitalized since last visit: No Patient Requires Transmission-Based No Has Dressing in Place as Prescribed: Yes Precautions: Has Compression in Place as Prescribed: Yes Patient Has Alerts: Yes Pain Present Now: No Patient Alerts: NOT diabetic ABI R1.18 L1.25 06/27/20 Electronic Signature(s) Signed: 10/02/2020 2:08:45 PM By: Donnamarie Poag Entered ByDonnamarie Poag on 10/02/2020 12:37:42 Whiting, [name] ([id number]) -------------------------------------------------------------------------------- Clinic Level of Care Assessment Details Patient Name: [name] [name], [name] Date of Service: 10/02/2020 12:30 PM Medical Record Number: [id number] Patient Account Number: [id number] Date of Birth/Sex: [birth year] (87 y.o. F) Treating RN: Donnamarie Poag Primary Care Eliott Amparan: [name] Other Clinician: Referring Bertrand Vowels: [name] Treating Jasdeep Kepner/Extender: Skipper Cliche in Treatment: 14 Clinic Level of Care Assessment Items TOOL 1 Quantity Score []  - Use when EandM and Procedure is performed on INITIAL visit 0 ASSESSMENTS - Nursing Assessment / Reassessment []  - General Physical Exam (combine w/ comprehensive assessment (listed just below) when performed on new 0 pt.  evals) []  - 0 Comprehensive Assessment (HX, ROS, Risk Assessments, Wounds Hx, etc.) ASSESSMENTS - Wound and Skin Assessment / Reassessment []  - Dermatologic / Skin Assessment (not related to wound area) 0 ASSESSMENTS - Ostomy and/or Continence Assessment and Care []  - Incontinence Assessment and Management 0 []  - 0 Ostomy Care Assessment and Management (repouching, etc.) PROCESS - Coordination of Care []  - Simple Patient / Family Education for ongoing care 0 []  - 0 Complex (extensive) Patient / Family Education for ongoing care []  - 0 Staff obtains Programmer, systems, Records, Test Results / Process Orders []  - 0 Staff telephones HHA, Nursing Homes / Clarify orders / etc []  - 0 Routine Transfer to another Facility (non-emergent condition) []  - 0 Routine Hospital Admission (non-emergent condition) []  - 0 New Admissions / Biomedical engineer / Ordering NPWT, Apligraf, etc. []  - 0 Emergency Hospital Admission (emergent condition) PROCESS - Special Needs []  - Pediatric / Minor Patient Management 0 []  - 0 Isolation Patient Management []  - 0 Hearing / Language / Visual special needs []  - 0 Assessment of Community assistance (transportation, D/C planning, etc.) []  - 0 Additional assistance / Altered mentation []  - 0 Support Surface(s) Assessment (bed, cushion, seat, etc.) INTERVENTIONS - Miscellaneous []  - External ear exam 0 []  - 0 Patient Transfer (multiple staff / Civil Service fast streamer / Similar devices) []  - 0 Simple Staple / Suture removal (25 or less) []  - 0 Complex Staple / Suture removal (26 or more) []  - 0 Hypo/Hyperglycemic Management (do not check if billed separately) []  - 0 Ankle / Brachial Index (ABI) - do not check if billed separately Has the patient been seen at the hospital within the last three years: Yes Total Score: 0 Level Of Care: ____ [name], [name] ([id number]) Electronic Signature(s) Signed: 10/02/2020 2:08:45 PM By: Donnamarie Poag Entered By: Donnamarie Poag on  10/02/2020 13:04:35 [name] [name], [name] ([id number]) -------------------------------------------------------------------------------- Compression Therapy Details Patient Name: [name], [name] Date  of Service: 10/02/2020 12:30 PM Medical Record Number: [id number] Patient Account Number: [id number] Date of Birth/Sex: February 01, 1927 (87 y.o. F) Treating RN: Donnamarie Poag Primary Care Gavon Majano: [name] Other Clinician: Referring Candelaria Pies: [name] Treating Emmerie Battaglia/Extender: Skipper Cliche in Treatment: 14 Compression Therapy Performed for Wound Assessment: Wound #2 Left,Medial Calcaneus Performed By: Junius Argyle, RN Compression Type: Three Layer Post Procedure Diagnosis Same as Pre-procedure Electronic Signature(s) Signed: 10/02/2020 2:08:45 PM By: Donnamarie Poag Entered By: Donnamarie Poag on 10/02/2020 12:52:31 [name] [name], [name] ([id number]) -------------------------------------------------------------------------------- Encounter Discharge Information Details Patient Name: [name] [name], [name] Date of Service: 10/02/2020 12:30 PM Medical Record Number: [id number] Patient Account Number: [id number] Date of Birth/Sex: 01-13-1927 (87 y.o. F) Treating RN: Donnamarie Poag Primary Care Aala Ransom: [name] Other Clinician: Referring Sache Sane: [name] Treating Tanique Matney/Extender: Skipper Cliche in Treatment: 14 Encounter Discharge Information Items Post Procedure Vitals Discharge Condition: Stable Temperature (F): 97.6 Ambulatory Status: Wheelchair Pulse (bpm): 81 Discharge Destination: Home Respiratory Rate (breaths/min): 16 Transportation: Private Auto Blood Pressure (mmHg): 151/79 Accompanied By: daughter Schedule Follow-up Appointment: Yes Clinical Summary of Care: Electronic Signature(s) Signed: 10/02/2020 2:08:45 PM By: Donnamarie Poag Entered ByDonnamarie Poag on 10/02/2020 13:16:07 [name], [name]  ([id number]) -------------------------------------------------------------------------------- Lower Extremity Assessment Details Patient Name: [name] [name], [name] Date of Service: 10/02/2020 12:30 PM Medical Record Number: [id number] Patient Account Number: [id number] Date of Birth/Sex: 02-02-1927 (87 y.o. F) Treating RN: Donnamarie Poag Primary Care Jacarra Bobak: [name] Other Clinician: Referring Caniya Tagle: [name] Treating Adraine Biffle/Extender: Skipper Cliche in Treatment: 14 Edema Assessment Assessed: Shirlyn Goltz: Yes] Patrice Paradise: No] Edema: [Left: N] [Right: o] Calf Left: Right: Point of Measurement: 32 cm From Medial Instep 32 cm Ankle Left: Right: Point of Measurement: 12 cm From Medial Instep 22 cm Vascular Assessment Pulses: Dorsalis Pedis Palpable: [Left:Yes] Electronic Signature(s) Signed: 10/02/2020 2:08:45 PM By: Donnamarie Poag Entered ByDonnamarie Poag on 10/02/2020 12:46:58 Kissoon, Fulton ([id number]) -------------------------------------------------------------------------------- Multi Wound Chart Details Patient Name: [name] [name], [name] Date of Service: 10/02/2020 12:30 PM Medical Record Number: [id number] Patient Account Number: [id number] Date of Birth/Sex: 05/28/1926 (87 y.o. F) Treating RN: Donnamarie Poag Primary Care Anish Vana: [name] Other Clinician: Referring Ayiden Milliman: [name] Treating Thai Burgueno/Extender: Skipper Cliche in Treatment: 14 Vital Signs Height(in): 35 Pulse(bpm): 66 Weight(lbs): 137 Blood Pressure(mmHg): 151/79 Body Mass Index(BMI): 29 Temperature(F): 97.6 Respiratory Rate(breaths/min): 16 Photos: [N/A:N/A] Wound Location: Left, Medial Calcaneus N/A N/A Wounding Event: Pressure Injury N/A N/A Primary Etiology: Pressure Ulcer N/A N/A Comorbid History: Cataracts, Anemia, Coronary Artery N/A N/A Disease, Hypertension, Myocardial Infarction, End Stage Renal Disease, Gout, Osteoarthritis, Dementia Date Acquired: 04/11/2020 N/A  N/A Weeks of Treatment: 14 N/A N/A Wound Status: Open N/A N/A Measurements L x W x D (cm) 0.4x0.3x0.4 N/A N/A Area (cm) : 0.094 N/A N/A Volume (cm) : 0.038 N/A N/A % Reduction in Area: 92.00% N/A N/A % Reduction in Volume: 91.90% N/A N/A Classification: Category/Stage III N/A N/A Exudate Amount: Medium N/A N/A Exudate Type: Serosanguineous N/A N/A Exudate Color: red, brown N/A N/A Granulation Amount: Large (67-100%) N/A N/A Granulation Quality: Pink, Pale N/A N/A Necrotic Amount: Small (1-33%) N/A N/A Exposed Structures: Fat Layer (Subcutaneous Tissue): N/A N/A Yes Fascia: No Tendon: No Muscle: No Joint: No Bone: No Epithelialization: None N/A N/A Treatment Notes Electronic Signature(s) Signed: 10/02/2020 2:08:45 PM By: Donnamarie Poag Entered ByDonnamarie Poag on 10/02/2020 12:47:27 Cornell, Brieana L. ([id number]) -------------------------------------------------------------------------------- [place] Details Patient Name: [name] [name], [name] Date of Service: 10/02/2020 12:30 PM Medical Record Number: [id number] Patient Account  Number: [id number] Date of Birth/Sex: [birth year] (87 y.o. F) Treating RN: Donnamarie Poag Primary Care Han Vejar: [name] Other Clinician: Referring Dashanti Burr: [name] Treating Naydeline Morace/Extender: Skipper Cliche in Treatment: 14 Active Inactive Wound/Skin Impairment Nursing Diagnoses: Impaired tissue integrity Goals: Patient/caregiver will verbalize understanding of skin care regimen Date Initiated: 06/26/2020 Date Inactivated: 08/21/2020 Target Resolution Date: 06/26/2020 Goal Status: Met Ulcer/skin breakdown will have a volume reduction of 30% by week 4 Date Initiated: 06/26/2020 Date Inactivated: 08/21/2020 Target Resolution Date: 07/27/2020 Goal Status: Met Ulcer/skin breakdown will have a volume reduction of 50% by week 8 Date Initiated: 06/26/2020 Date Inactivated: 09/18/2020 Target Resolution Date: 08/26/2020 Goal  Status: Unmet Unmet Reason: con't tx Ulcer/skin breakdown will have a volume reduction of 80% by week 12 Date Initiated: 06/26/2020 Target Resolution Date: 09/26/2020 Goal Status: Active Ulcer/skin breakdown will heal within 14 weeks Date Initiated: 06/26/2020 Target Resolution Date: 10/27/2020 Goal Status: Active Interventions: Assess patient/caregiver ability to obtain necessary supplies Assess patient/caregiver ability to perform ulcer/skin care regimen upon admission and as needed Assess ulceration(s) every visit Provide education on ulcer and skin care Treatment Activities: Referred to DME Ainslie Mazurek for dressing supplies : 06/26/2020 Skin care regimen initiated : 06/26/2020 Notes: Electronic Signature(s) Signed: 10/02/2020 2:08:45 PM By: Donnamarie Poag Entered ByDonnamarie Poag on 10/02/2020 12:47:13 [name] [name], [name] ([id number]) -------------------------------------------------------------------------------- Pain Assessment Details Patient Name: Wadley, Leeta L. Date of Service: 10/02/2020 12:30 PM Medical Record Number: [id number] Patient Account Number: [id number] Date of Birth/Sex: 1926-10-07 (87 y.o. F) Treating RN: Donnamarie Poag Primary Care Raphael Fitzpatrick: [name] Other Clinician: Referring Kasson Lamere: [name] Treating Ireland Virrueta/Extender: Skipper Cliche in Treatment: 14 Active Problems Location of Pain Severity and Description of Pain Patient Has Paino No Site Locations Rate the pain. Current Pain Level: 0 Pain Management and Medication Current Pain Management: Electronic Signature(s) Signed: 10/02/2020 2:08:45 PM By: Donnamarie Poag Entered By: Donnamarie Poag on 10/02/2020 12:39:04 [name] [name], [name] ([id number]) -------------------------------------------------------------------------------- Patient/Caregiver Education Details Patient Name: [name] [name], [name] Date of Service: 10/02/2020 12:30 PM Medical Record Number: [id number] Patient Account Number: [id number] Date  of Birth/Gender: 1926/11/15 (87 y.o. F) Treating RN: Donnamarie Poag Primary Care Physician: [name] Other Clinician: Referring Physician: [name] Treating Physician/Extender: Skipper Cliche in Treatment: 14 Education Assessment Education Provided To: Patient and Caregiver Education Topics Provided Wound/Skin Impairment: Electronic Signature(s) Signed: 10/02/2020 2:08:45 PM By: Donnamarie Poag Entered ByDonnamarie Poag on 10/02/2020 13:15:24 [name] [name], [name] ([id number]) -------------------------------------------------------------------------------- Wound Assessment Details Patient Name: Chirico, Kenyana L. Date of Service: 10/02/2020 12:30 PM Medical Record Number: [id number] Patient Account Number: [id number] Date of Birth/Sex: Oct 21, 1926 (87 y.o. F) Treating RN: Donnamarie Poag Primary Care Sharra Cayabyab: [name] Other Clinician: Referring Shaquella Stamant: [name] Treating Shamond Skelton/Extender: Skipper Cliche in Treatment: 14 Wound Status Wound Number: 2 Primary Pressure Ulcer Etiology: Wound Location: Left, Medial Calcaneus Wound Open Wounding Event: Pressure Injury Status: Date Acquired: 04/11/2020 Comorbid Cataracts, Anemia, Coronary Artery Disease, Weeks Of Treatment: 14 History: Hypertension, Myocardial Infarction, End Stage Renal Clustered Wound: No Disease, Gout, Osteoarthritis, Dementia Photos Wound Measurements Length: (cm) 0.4 Width: (cm) 0.3 Depth: (cm) 0.4 Area: (cm) 0.094 Volume: (cm) 0.038 % Reduction in Area: 92% % Reduction in Volume: 91.9% Epithelialization: None Tunneling: No Undermining: No Wound Description Classification: Category/Stage III Exudate Amount: Medium Exudate Type: Serosanguineous Exudate Color: red, brown Foul Odor After Cleansing: No Slough/Fibrino Yes Wound Bed Granulation Amount: Large (67-100%) Exposed Structure Granulation Quality: Pink, Pale Fascia Exposed: No Necrotic Amount: Small (1-33%) Fat Layer  (Subcutaneous Tissue) Exposed: Yes  Necrotic Quality: Adherent Slough Tendon Exposed: No Muscle Exposed: No Joint Exposed: No Bone Exposed: No Treatment Notes Wound #2 (Calcaneus) Wound Laterality: Left, Medial Cleanser Soap and Water Discharge Instruction: Gently cleanse wound with antibacterial soap, rinse and pat dry prior to dressing wounds Peri-Wound Care Abboud, Massena ([id number]) Topical Primary Dressing Hydrofera Blue Ready Transfer Foam, 2.5x2.5 (in/in) Discharge Instruction: Apply Hydrofera Blue Ready to wound bed as directed Secondary Dressing ABD Pad 5x9 (in/in) Discharge Instruction: Cover with ABD pad. Secured With Compression Wrap Profore Lite LF 3 Multilayer Compression Bandaging System Discharge Instruction: Apply 3 multi-layer wrap as prescribed. Compression Stockings Add-Ons Electronic Signature(s) Signed: 10/02/2020 2:08:45 PM By: Donnamarie Poag Entered By: Donnamarie Poag on 10/02/2020 12:45:59 Heider, Chyanne L. ([id number]) -------------------------------------------------------------------------------- Wound Assessment Details Patient Name: Fleeman, Misha L. Date of Service: 10/02/2020 12:30 PM Medical Record Number: [id number] Patient Account Number: [id number] Date of Birth/Sex: 03/29/1926 (87 y.o. F) Treating RN: Donnamarie Poag Primary Care Tasnim Balentine: [name] Other Clinician: Referring Emmajane Altamura: [name] Treating Eeva Schlosser/Extender: Skipper Cliche in Treatment: 14 Wound Status Wound Number: 3 Primary Pressure Ulcer Etiology: Wound Location: Left Foot Wound Open Wounding Event: Pressure Injury Status: Date Acquired: 10/02/2020 Comorbid Cataracts, Anemia, Coronary Artery Disease, Weeks Of Treatment: 0 History: Hypertension, Myocardial Infarction, End Stage Renal Clustered Wound: No Disease, Gout, Osteoarthritis, Dementia Photos Wound Measurements Length: (cm) 0.8 Width: (cm) 0.5 Depth: (cm) 0.1 Area: (cm) 0.314 Volume: (cm)  0.031 % Reduction in Area: % Reduction in Volume: Tunneling: No Undermining: No Wound Description Classification: Category/Stage III Wound Margin: Thickened Exudate Amount: Medium Exudate Type: Serosanguineous Exudate Color: red, brown Foul Odor After Cleansing: No Slough/Fibrino Yes Wound Bed Granulation Amount: None Present (0%) Exposed Structure Necrotic Amount: Large (67-100%) Fascia Exposed: No Necrotic Quality: Eschar Fat Layer (Subcutaneous Tissue) Exposed: Yes Tendon Exposed: No Muscle Exposed: No Joint Exposed: No Bone Exposed: No Treatment Notes Wound #3 (Foot) Wound Laterality: Left Cleanser Soap and Water Discharge Instruction: Gently cleanse wound with antibacterial soap, rinse and pat dry prior to dressing wounds Barge, Mariesa L. ([id number]) Peri-Wound Care Topical Primary Dressing Iodosorb 40 (g) Discharge Instruction: Apply IodoSorb to wound bed only as directed. Secondary Dressing ABD Pad 5x9 (in/in) Discharge Instruction: Cover with ABD pad Secured With Compression Wrap Profore Lite LF 3 Multilayer Compression Bandaging System Discharge Instruction: Apply 3 multi-layer wrap as prescribed. Compression Stockings Add-Ons Electronic Signature(s) Signed: 10/02/2020 2:08:45 PM By: Donnamarie Poag Entered By: Donnamarie Poag on 10/02/2020 13:02:15 Petrelli, Shamonique L. ([id number]) -------------------------------------------------------------------------------- Vitals Details Patient Name: Renbarger, Bryleigh L. Date of Service: 10/02/2020 12:30 PM Medical Record Number: [id number] Patient Account Number: [id number] Date of Birth/Sex: 02/04/1927 (87 y.o. F) Treating RN: Donnamarie Poag Primary Care Maythe Deramo: [name] Other Clinician: Referring Saara Kijowski: [name] Treating Omie Ferger/Extender: Skipper Cliche in Treatment: 14 Vital Signs Time Taken: 12:36 Temperature (F): 97.6 Height (in): 58 Pulse (bpm): 81 Weight (lbs): 137 Respiratory Rate  (breaths/min): 16 Body Mass Index (BMI): 28.6 Blood Pressure (mmHg): 151/79 Reference Range: 80 - 120 mg / dl Electronic Signature(s) Signed: 10/02/2020 2:08:45 PM By: Donnamarie Poag Entered ByDonnamarie Poag on 10/02/2020 12:38:09

## 2020-10-09 ENCOUNTER — Other Ambulatory Visit: Payer: Self-pay

## 2020-10-09 ENCOUNTER — Encounter: Payer: PPO | Admitting: Physician Assistant

## 2020-10-09 DIAGNOSIS — L89893 Pressure ulcer of other site, stage 3: Secondary | ICD-10-CM | POA: Diagnosis not present

## 2020-10-09 DIAGNOSIS — L97519 Non-pressure chronic ulcer of other part of right foot with unspecified severity: Secondary | ICD-10-CM | POA: Diagnosis not present

## 2020-10-09 NOTE — Progress Notes (Addendum)
Resnik, Remi L. ([id number]) Visit Report for 10/09/2020 Chief Complaint Document Details Patient Name: Mungia, Zoe L. Date of Service: 10/09/2020 11:00 AM Medical Record Number: [id number] Patient Account Number: [id number] Date of Birth/Sex: 15-Oct-1926 (87 y.o. F) Treating RN: Donnamarie Poag Primary Care Provider: [name] Other Clinician: Referring Provider: [name] Treating Provider/Extender: Skipper Cliche in Treatment: 15 Information Obtained from: Patient Chief Complaint Left Heel Pressure Ulcer Electronic Signature(s) Signed: 10/09/2020 11:04:36 AM By: Worthy Keeler PA-C Entered By: Worthy Keeler on 10/09/2020 11:04:36 Maslin, Katricia L. ([id number]) -------------------------------------------------------------------------------- Debridement Details Patient Name: Garn, Sahory L. Date of Service: 10/09/2020 11:00 AM Medical Record Number: [id number] Patient Account Number: [id number] Date of Birth/Sex: 1926/08/06 (87 y.o. F) Treating RN: Donnamarie Poag Primary Care Provider: [name] Other Clinician: Referring Provider: [name] Treating Provider/Extender: Skipper Cliche in Treatment: 15 Debridement Performed for Wound #2 Left,Medial Calcaneus Assessment: Performed By: Physician Tommie Sams., PA-C Debridement Type: Debridement Level of Consciousness (Pre- Awake and Alert procedure): Pre-procedure Verification/Time Out Yes - 11:53 Taken: Start Time: 11:53 Pain Control: Lidocaine Total Area Debrided (L x W): 0.5 (cm) x 0.3 (cm) = 0.15 (cm) Tissue and other material Viable, Non-Viable, Callus, Slough, Subcutaneous, Biofilm, Slough debrided: Level: Skin/Subcutaneous Tissue Debridement Description: Excisional Instrument: Curette Bleeding: Minimum Hemostasis Achieved: Pressure Response to Treatment: Procedure was tolerated well Level of Consciousness (Post- Awake and Alert procedure): Post Debridement Measurements of Total  Wound Length: (cm) 0.4 Stage: Category/Stage III Width: (cm) 0.2 Depth: (cm) 0.3 Volume: (cm) 0.019 Character of Wound/Ulcer Post Debridement: Improved Post Procedure Diagnosis Same as Pre-procedure Electronic Signature(s) Signed: 10/09/2020 4:46:26 PM By: Donnamarie Poag Signed: 10/09/2020 5:51:22 PM By: Worthy Keeler PA-C Entered By: Donnamarie Poag on 10/09/2020 11:54:40 Weisensel, Kadeshia L. ([id number]) -------------------------------------------------------------------------------- Debridement Details Patient Name: Amor, Nyrie L. Date of Service: 10/09/2020 11:00 AM Medical Record Number: [id number] Patient Account Number: [id number] Date of Birth/Sex: 1926-06-20 (87 y.o. F) Treating RN: Donnamarie Poag Primary Care Provider: [name] Other Clinician: Referring Provider: [name] Treating Provider/Extender: Skipper Cliche in Treatment: 15 Debridement Performed for Wound #3 Left Foot Assessment: Performed By: Physician Tommie Sams., PA-C Debridement Type: Debridement Level of Consciousness (Pre- Awake and Alert procedure): Pre-procedure Verification/Time Out Yes - 11:53 Taken: Start Time: 11:55 Pain Control: Lidocaine Total Area Debrided (L x W): 0.4 (cm) x 0.4 (cm) = 0.16 (cm) Tissue and other material Viable, Non-Viable, Callus, Slough, Subcutaneous, Biofilm, Slough debrided: Level: Skin/Subcutaneous Tissue Debridement Description: Excisional Instrument: Curette Bleeding: Minimum Hemostasis Achieved: Pressure Response to Treatment: Procedure was tolerated well Level of Consciousness (Post- Awake and Alert procedure): Post Debridement Measurements of Total Wound Length: (cm) 0.4 Stage: Category/Stage III Width: (cm) 0.4 Depth: (cm) 0.4 Volume: (cm) 0.05 Character of Wound/Ulcer Post Debridement: Improved Post Procedure Diagnosis Same as Pre-procedure Electronic Signature(s) Signed: 10/09/2020 4:46:26 PM By: Donnamarie Poag Signed: 10/09/2020 5:51:22  PM By: Worthy Keeler PA-C Entered By: Donnamarie Poag on 10/09/2020 11:55:36 Holm, Beverly ([id number]) -------------------------------------------------------------------------------- HPI Details Patient Name: Marquart, Parthenia L. Date of Service: 10/09/2020 11:00 AM Medical Record Number: [id number] Patient Account Number: [id number] Date of Birth/Sex: 07-08-1926 (87 y.o. F) Treating RN: Donnamarie Poag Primary Care Provider: [name] Other Clinician: Referring Provider: [name] Treating Provider/Extender: Skipper Cliche in Treatment: 15 History of Present Illness HPI Description: 87 year old patient was recently seen by the PCPs office for significant pain right great toe which has been going on since July. She was initially treated with Keflex which she did not  complete and after the office visit this time she has been put on doxycycline. at the time of her visit she was found to have a ulcer on the plantar surface of the right great toe and also had a pyogenic granuloma over this area. X-ray of the right foot done 12/29/2014 -- IMPRESSION:Soft-tissue swelling and ulceration right great toe. No underlying bony lytic lesion identified. If osteomyelitis remains of clinical concern MRI can be obtained. Past medical history significant for anemia, chronic kidney disease stage III, obesity, varicose veins, coronary artery disease, gout, history of nicotine addiction given up smoking in 2002, hypertension, status post cardiac catheterization, status post abdominal hysterectomy, cholecystectomy and tonsillectomy. hemoglobin A1c done in August was 5.8 01/22/2015 -- at this stage the [facility] Walking boat was going to cost them significant amount of money and they want to defer using that at the present time. 01/29/2015 -- she had a podiatry appointment and they have trimmed her toenails. She has not heard back from the vascular office regarding her venous duplex study and I have asked them  to call personally so that they can get the appointment soon. 02/06/2015 -- they have made contact with the vascular office and from what I understand that test has been done but the report is pending. 02/19/2014 -- the vascular test is scheduled for tomorrow Readmission: 06/26/2020 upon evaluation today patient appears for initial evaluation in her clinic that she has been here before in 2016 and has been quite sometime. She did have a fractured hip in February on the 23rd 2022. Subsequently this had to be pinned and she ended up with a pressure injury on her heel following when she was using her foot to help move her around in the bed. Subsequently this has led to the wound that she has been dealing with since that time. She lives at home with her daughter currently she does have dementia. 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 07/03/2020 upon evaluation today patient appears to be doing well with regard to her heel ulcer. She did have arterial studies they appear to be doing excellent it was premature normal across the board with TBI's in the 90s and ABIs well within normal range. Nonetheless there does not appear to be any signs of arterial insufficiency whatsoever. With that being said the patient does have also signs of improvement there is some necrotic tissue in the base of the wound number to try to clear some of that away today I do believe the Iodoflex/Iodosorb is doing well. 5/24; difficult punched out wound on the left medial heel. She has been using Iodoflex 07/17/2020 upon evaluation today patient appears to be doing well at this point in regard to her wound. I do feel like this is a little bit deeper but again that is what is expected as we continue with the Iodoflex I think that this is just going to get deeper until we get to the base of the  wound. With that being said I think we are getting much closer to the base of the wound where we can have healthier tissue that we get a be managing here which that will be awesome. In the meantime I am not surprised by what I am seeing and in fact the wound appears to be better as compared to previous findings. 07/24/2020 upon evaluation today patient appears to be doing well with  regard to her wound. Overall I am extremely pleased with where things stand today. I do not see any signs of active infection which is great and overall I think that the patient is making good progress. There is good to be some need for sharp debridement today. 07/31/2020 upon evaluation today patient appears to be doing better in regard to her heel ulcer. She has been tolerating the dressing changes without complication. Fortunately there does not appear to be any signs of active infection which is great and overall very pleased in this regard. No fevers, chills, nausea, vomiting, or diarrhea. 08/14/2020 upon evaluation today patient appears to be doing better in regard to her heel ulcer. I am very pleased in that regard. Unfortunately she still has a slight deep tissue injury in regard to the medial portion of her foot over the same area. Unfortunately I think this is something that if were not careful it was can open up into the wound. That is my main concern here based on what I see. Fortunately there does not appear to be any evidence of active infection which is great news and overall very pleased with where things stand at this point. 08/21/2020 upon evaluation today patient appears to be doing well with regard to her wound. She has been tolerating the dressing changes without complication. Fortunately there does not appear to be any signs of active infection at this time. No fevers, chills, nausea, vomiting, or diarrhea. I do believe the compression wrap was beneficial for her. 08/28/2020 upon evaluation today patient  appears to be doing well with regard to her wounds currently. Fortunately there does not appear to be any signs of active infection at this time. No fevers, chills, nausea, vomiting, or diarrhea. With that being said I think that her leg is doing quite well to be honest. 09/04/2020 upon evaluation today patient appears to be doing well with regard to her wound on the heel. This is showing signs of good epithelial growth although there is probably can it be an indention where this heals that is okay as long as we get it closed. Fortunately I do not see any evidence of infection at this point. 09/13/2020 upon evaluation today patient appears to be doing well with regard to her wounds. She has been tolerating the dressing changes Harshbarger, Asante L. ([id number]) without complication. Fortunately he is actually doing extremely well and I think she is making great progress this is measuring smaller. I would recommend that such that we continue with the [facility] likely since he is doing so well. 09/18/2020 upon evaluation today patient appears to be doing well with regard to her heel ulcer. She is making good progress and I am very pleased with what we see today. I think the Hydrofera Blue is still doing excellent. 10/02/2020 upon evaluation today patient's wound actually appears to be showing signs of good improvement in regard to the heel. I am very pleased in this regard. With that being said I do believe that the area which is somewhat been stable and dry is starting to lift up and beginning to clear this away as well that is on the foot. Subsequently I am going to have to perform some debridement here and we did actually go ahead and allow this as a wound today as before it was just more deep tissue injury and eschar covering now I think it is a little bit more than that to be honest. 10/09/2020 upon evaluation today patient appears to  be doing decently well in regard to her wounds. I am actually very  pleased with where things stand today. There does not appear to be any signs of active infection which is great news. No fevers, chills, nausea, vomiting, or diarrhea. She is going require some sharp debridement today. Electronic Signature(s) Signed: 10/09/2020 5:21:41 PM By: Worthy Keeler PA-C Entered By: Worthy Keeler on 10/09/2020 17:21:41 Gillham, Lyn L. ([id number]) -------------------------------------------------------------------------------- Physical Exam Details Patient Name: Munce, Kearstyn L. Date of Service: 10/09/2020 11:00 AM Medical Record Number: [id number] Patient Account Number: [id number] Date of Birth/Sex: 1926-04-27 (87 y.o. F) Treating RN: Donnamarie Poag Primary Care Provider: [name] Other Clinician: Referring Provider: [name] Treating Provider/Extender: Skipper Cliche in Treatment: 55 Constitutional Well-nourished and well-hydrated in no acute distress. Respiratory normal breathing without difficulty. Psychiatric this patient is able to make decisions and demonstrates good insight into disease process. Alert and Oriented x 3. pleasant and cooperative. Notes I did perform sharp debridement today to clear away some of the slough and biofilm down to good subcutaneous tissue. The patient actually tolerated this today without complication and postdebridement the wound bed appears to be doing much better which is great news overall I am extremely pleased with where things stand today. Electronic Signature(s) Signed: 10/09/2020 5:22:14 PM By: Worthy Keeler PA-C Entered By: Worthy Keeler on 10/09/2020 17:22:14 Goeser, Kaeleigh L. ([id number]) -------------------------------------------------------------------------------- Physician Orders Details Patient Name: Foiles, Shalandria L. Date of Service: 10/09/2020 11:00 AM Medical Record Number: [id number] Patient Account Number: [id number] Date of Birth/Sex: 1926-08-24 (87 y.o. F) Treating RN: Donnamarie Poag Primary Care Provider: [name] Other Clinician: Referring Provider: [name] Treating Provider/Extender: Skipper Cliche in Treatment: 15 Verbal / Phone Orders: No Diagnosis Coding ICD-10 Coding Code Description [phone] Pressure ulcer of left heel, stage 3 L89.893 Pressure ulcer of other site, stage 3 I10 Essential (primary) hypertension F01.50 Vascular dementia without behavioral disturbance I25.10 Atherosclerotic heart disease of native coronary artery without angina pectoris Follow-up Appointments o Return Appointment in 1 week. o Nurse Visit as needed Bathing/ Shower/ Hygiene o May shower; gently cleanse wound with antibacterial soap, rinse and pat dry prior to dressing wounds Edema Control - Lymphedema / Segmental Compressive Device / Other o Patient to wear own compression stockings. Remove compression stockings every night before going to bed and put on every morning when getting up. - right leg o Elevate legs to the level of the heart and pump ankles as often as possible o Elevate leg(s) parallel to the floor when sitting. o DO YOUR BEST to sleep in the bed at night. DO NOT sleep in your recliner. Long hours of sitting in a recliner leads to swelling of the legs and/or potential wounds on your backside. Off-Loading o Other: - Offloading boots while in bed; Try pillow under sheet to keep it in place. Wound Treatment Wound #2 - Calcaneus Wound Laterality: Left, Medial Cleanser: Soap and Water 1 x Per Week/30 Days Discharge Instructions: Gently cleanse wound with antibacterial soap, rinse and pat dry prior to dressing wounds Primary Dressing: Hydrofera Blue Ready Transfer Foam, 2.5x2.5 (in/in) 1 x Per Week/30 Days Discharge Instructions: Apply Hydrofera Blue Ready to wound bed as directed Secondary Dressing: ABD Pad 5x9 (in/in) 1 x Per Week/30 Days Discharge Instructions: Cover with ABD pad. Compression Wrap: Profore Lite LF 3 Multilayer  Compression Bandaging System 1 x Per Week/30 Days Discharge Instructions: Apply 3 multi-layer wrap as prescribed. Wound #3 - Foot Wound Laterality: Left Cleanser: Soap  and Water 1 x Per Week/30 Days Discharge Instructions: Gently cleanse wound with antibacterial soap, rinse and pat dry prior to dressing wounds Primary Dressing: Iodosorb 40 (g) 1 x Per Week/30 Days Discharge Instructions: Apply IodoSorb to wound bed only as directed. Secondary Dressing: ABD Pad 5x9 (in/in) 1 x Per Week/30 Days Discharge Instructions: Cover with ABD pad Compression Wrap: Profore Lite LF 3 Multilayer Compression Bandaging System 1 x Per Week/30 Days Discharge Instructions: Apply 3 multi-layer wrap as prescribed. Piscopo, Sindee L. ([id number]) Electronic Signature(s) Signed: 10/09/2020 4:46:26 PM By: Donnamarie Poag Signed: 10/09/2020 5:51:22 PM By: Worthy Keeler PA-C Entered By: Donnamarie Poag on 10/09/2020 11:56:29 Leicht, Ronalda L. ([id number]) -------------------------------------------------------------------------------- Problem List Details Patient Name: Seelman, Dreamer L. Date of Service: 10/09/2020 11:00 AM Medical Record Number: [id number] Patient Account Number: [id number] Date of Birth/Sex: 15-Dec-1926 (87 y.o. F) Treating RN: Donnamarie Poag Primary Care Provider: [name] Other Clinician: Referring Provider: [name] Treating Provider/Extender: Skipper Cliche in Treatment: 15 Active Problems ICD-10 Encounter Code Description Active Date MDM Diagnosis [phone] Pressure ulcer of left heel, stage 3 06/26/2020 No Yes L89.893 Pressure ulcer of other site, stage 3 10/02/2020 No Yes I10 Essential (primary) hypertension 06/26/2020 No Yes F01.50 Vascular dementia without behavioral disturbance 06/26/2020 No Yes I25.10 Atherosclerotic heart disease of native coronary artery without angina 06/26/2020 No Yes pectoris Inactive Problems Resolved Problems Electronic Signature(s) Signed: 10/09/2020  11:04:18 AM By: Worthy Keeler PA-C Entered By: Worthy Keeler on 10/09/2020 11:04:18 Bourbon, Sanoe L. ([id number]) -------------------------------------------------------------------------------- Progress Note Details Patient Name: Mulligan, Emerald L. Date of Service: 10/09/2020 11:00 AM Medical Record Number: [id number] Patient Account Number: [id number] Date of Birth/Sex: 1926-10-04 (87 y.o. F) Treating RN: Donnamarie Poag Primary Care Provider: [name] Other Clinician: Referring Provider: [name] Treating Provider/Extender: Skipper Cliche in Treatment: 15 Subjective Chief Complaint Information obtained from Patient Left Heel Pressure Ulcer History of Present Illness (HPI) 87 year old patient was recently seen by the PCPs office for significant pain right great toe which has been going on since July. She was initially treated with Keflex which she did not complete and after the office visit this time she has been put on doxycycline. at the time of her visit she was found to have a ulcer on the plantar surface of the right great toe and also had a pyogenic granuloma over this area. X-ray of the right foot done 12/29/2014 -- IMPRESSION:Soft-tissue swelling and ulceration right great toe. No underlying bony lytic lesion identified. If osteomyelitis remains of clinical concern MRI can be obtained. Past medical history significant for anemia, chronic kidney disease stage III, obesity, varicose veins, coronary artery disease, gout, history of nicotine addiction given up smoking in 2002, hypertension, status post cardiac catheterization, status post abdominal hysterectomy, cholecystectomy and tonsillectomy. hemoglobin A1c done in August was 5.8 01/22/2015 -- at this stage the [facility] Walking boat was going to cost them significant amount of money and they want to defer using that at the present time. 01/29/2015 -- she had a podiatry appointment and they have trimmed her toenails. She  has not heard back from the vascular office regarding her venous duplex study and I have asked them to call personally so that they can get the appointment soon. 02/06/2015 -- they have made contact with the vascular office and from what I understand that test has been done but the report is pending. 02/19/2014 -- the vascular test is scheduled for tomorrow Readmission: 06/26/2020 upon evaluation today patient appears for initial evaluation in  her clinic that she has been here before in 2016 and has been quite sometime. She did have a fractured hip in February on the 23rd 2022. Subsequently this had to be pinned and she ended up with a pressure injury on her heel following when she was using her foot to help move her around in the bed. Subsequently this has led to the wound that she has been dealing with since that time. She lives at home with her daughter currently she does have dementia. 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 07/03/2020 upon evaluation today patient appears to be doing well with regard to her heel ulcer. She did have arterial studies they appear to be doing excellent it was premature normal across the board with TBI's in the 90s and ABIs well within normal range. Nonetheless there does not appear to be any signs of arterial insufficiency whatsoever. With that being said the patient does have also signs of improvement there is some necrotic tissue in the base of the wound number to try to clear some of that away today I do believe the Iodoflex/Iodosorb is doing well. 5/24; difficult punched out wound on the left medial heel. She has been using Iodoflex 07/17/2020 upon evaluation today patient appears to be doing well at this point in regard to her wound. I do feel like this is a little bit deeper but again that is what is expected as we continue with  the Iodoflex I think that this is just going to get deeper until we get to the base of the wound. With that being said I think we are getting much closer to the base of the wound where we can have healthier tissue that we get a be managing here which that will be awesome. In the meantime I am not surprised by what I am seeing and in fact the wound appears to be better as compared to previous findings. 07/24/2020 upon evaluation today patient appears to be doing well with regard to her wound. Overall I am extremely pleased with where things stand today. I do not see any signs of active infection which is great and overall I think that the patient is making good progress. There is good to be some need for sharp debridement today. 07/31/2020 upon evaluation today patient appears to be doing better in regard to her heel ulcer. She has been tolerating the dressing changes without complication. Fortunately there does not appear to be any signs of active infection which is great and overall very pleased in this regard. No fevers, chills, nausea, vomiting, or diarrhea. 08/14/2020 upon evaluation today patient appears to be doing better in regard to her heel ulcer. I am very pleased in that regard. Unfortunately she still has a slight deep tissue injury in regard to the medial portion of her foot over the same area. Unfortunately I think this is something that if were not careful it was can open up into the wound. That is my main concern here based on what I see. Fortunately there does not appear to be any evidence of active infection which is great news and overall very pleased with where things stand at this point. 08/21/2020 upon evaluation today patient appears to be doing well with regard to her wound. She has been tolerating the dressing changes without complication. Fortunately there does not appear to be any signs of active infection  at this time. No fevers, chills, nausea, vomiting, or diarrhea. I do  believe the compression wrap was beneficial for her. 08/28/2020 upon evaluation today patient appears to be doing well with regard to her wounds currently. Fortunately there does not appear to be any signs of active infection at this time. No fevers, chills, nausea, vomiting, or diarrhea. With that being said I think that her leg is doing quite well to be honest. Cryderman, Jashley L. ([id number]) 09/04/2020 upon evaluation today patient appears to be doing well with regard to her wound on the heel. This is showing signs of good epithelial growth although there is probably can it be an indention where this heals that is okay as long as we get it closed. Fortunately I do not see any evidence of infection at this point. 09/13/2020 upon evaluation today patient appears to be doing well with regard to her wounds. She has been tolerating the dressing changes without complication. Fortunately he is actually doing extremely well and I think she is making great progress this is measuring smaller. I would recommend that such that we continue with the [facility] likely since he is doing so well. 09/18/2020 upon evaluation today patient appears to be doing well with regard to her heel ulcer. She is making good progress and I am very pleased with what we see today. I think the Hydrofera Blue is still doing excellent. 10/02/2020 upon evaluation today patient's wound actually appears to be showing signs of good improvement in regard to the heel. I am very pleased in this regard. With that being said I do believe that the area which is somewhat been stable and dry is starting to lift up and beginning to clear this away as well that is on the foot. Subsequently I am going to have to perform some debridement here and we did actually go ahead and allow this as a wound today as before it was just more deep tissue injury and eschar covering now I think it is a little bit more than that to be honest. 10/09/2020 upon  evaluation today patient appears to be doing decently well in regard to her wounds. I am actually very pleased with where things stand today. There does not appear to be any signs of active infection which is great news. No fevers, chills, nausea, vomiting, or diarrhea. She is going require some sharp debridement today. Objective Constitutional Well-nourished and well-hydrated in no acute distress. Vitals Time Taken: 11:33 AM, Height: 58 in, Weight: 137 lbs, BMI: 28.6, Temperature: 97.6 F, Pulse: 76 bpm, Respiratory Rate: 16 breaths/min, Blood Pressure: 135/79 mmHg. Respiratory normal breathing without difficulty. Psychiatric this patient is able to make decisions and demonstrates good insight into disease process. Alert and Oriented x 3. pleasant and cooperative. General Notes: I did perform sharp debridement today to clear away some of the slough and biofilm down to good subcutaneous tissue. The patient actually tolerated this today without complication and postdebridement the wound bed appears to be doing much better which is great news overall I am extremely pleased with where things stand today. Integumentary (Hair, Skin) Wound #2 status is Open. Original cause of wound was Pressure Injury. The date acquired was: 04/11/2020. The wound has been in treatment 15 weeks. The wound is located on the Left,Medial Calcaneus. The wound measures 0.4cm length x 0.2cm width x 0.3cm depth; 0.063cm^2 area and 0.019cm^3 volume. There is Fat Layer (Subcutaneous Tissue) exposed. There is no tunneling or undermining noted. There is a medium amount  of serosanguineous drainage noted. There is large (67-100%) pink, pale granulation within the wound bed. There is a small (1-33%) amount of necrotic tissue within the wound bed including Adherent Slough. Wound #3 status is Open. Original cause of wound was Pressure Injury. The date acquired was: 10/02/2020. The wound has been in treatment 1 weeks. The wound is  located on the Left Foot. The wound measures 0.4cm length x 0.4cm width x 0.4cm depth; 0.126cm^2 area and 0.05cm^3 volume. There is Fat Layer (Subcutaneous Tissue) exposed. There is no tunneling or undermining noted. There is a medium amount of serosanguineous drainage noted. The wound margin is thickened. There is medium (34-66%) red, pink granulation within the wound bed. There is a medium (34-66%) amount of necrotic tissue within the wound bed including Eschar and Adherent Slough. Assessment Active Problems ICD-10 Pressure ulcer of left heel, stage 3 Pressure ulcer of other site, stage 3 Essential (primary) hypertension Vascular dementia without behavioral disturbance Atherosclerotic heart disease of native coronary artery without angina pectoris Vallee, Kinleigh L. ([id number]) Procedures Wound #2 Pre-procedure diagnosis of Wound #2 is a Pressure Ulcer located on the Left,Medial Calcaneus . There was a Excisional Skin/Subcutaneous Tissue Debridement with a total area of 0.15 sq cm performed by Tommie Sams., PA-C. With the following instrument(s): Curette to remove Viable and Non-Viable tissue/material. Material removed includes Callus, Subcutaneous Tissue, Slough, and Biofilm after achieving pain control using Lidocaine. A time out was conducted at 11:53, prior to the start of the procedure. A Minimum amount of bleeding was controlled with Pressure. The procedure was tolerated well. Post Debridement Measurements: 0.4cm length x 0.2cm width x 0.3cm depth; 0.019cm^3 volume. Post debridement Stage noted as Category/Stage III. Character of Wound/Ulcer Post Debridement is improved. Post procedure Diagnosis Wound #2: Same as Pre-Procedure Pre-procedure diagnosis of Wound #2 is a Pressure Ulcer located on the Left,Medial Calcaneus . There was a Three Layer Compression Therapy Procedure by Donnamarie Poag, RN. Post procedure Diagnosis Wound #2: Same as Pre-Procedure Wound #3 Pre-procedure  diagnosis of Wound #3 is a Pressure Ulcer located on the Left Foot . There was a Excisional Skin/Subcutaneous Tissue Debridement with a total area of 0.16 sq cm performed by Tommie Sams., PA-C. With the following instrument(s): Curette to remove Viable and Non-Viable tissue/material. Material removed includes Callus, Subcutaneous Tissue, Slough, and Biofilm after achieving pain control using Lidocaine. A time out was conducted at 11:53, prior to the start of the procedure. A Minimum amount of bleeding was controlled with Pressure. The procedure was tolerated well. Post Debridement Measurements: 0.4cm length x 0.4cm width x 0.4cm depth; 0.05cm^3 volume. Post debridement Stage noted as Category/Stage III. Character of Wound/Ulcer Post Debridement is improved. Post procedure Diagnosis Wound #3: Same as Pre-Procedure Plan Follow-up Appointments: Return Appointment in 1 week. Nurse Visit as needed Bathing/ Shower/ Hygiene: May shower; gently cleanse wound with antibacterial soap, rinse and pat dry prior to dressing wounds Edema Control - Lymphedema / Segmental Compressive Device / Other: Patient to wear own compression stockings. Remove compression stockings every night before going to bed and put on every morning when getting up. - right leg Elevate legs to the level of the heart and pump ankles as often as possible Elevate leg(s) parallel to the floor when sitting. DO YOUR BEST to sleep in the bed at night. DO NOT sleep in your recliner. Long hours of sitting in a recliner leads to swelling of the legs and/or potential wounds on your backside. Off-Loading: Other: - Offloading boots while  in bed; Try pillow under sheet to keep it in place. WOUND #2: - Calcaneus Wound Laterality: Left, Medial Cleanser: Soap and Water 1 x Per Week/30 Days Discharge Instructions: Gently cleanse wound with antibacterial soap, rinse and pat dry prior to dressing wounds Primary Dressing: Hydrofera Blue Ready  Transfer Foam, 2.5x2.5 (in/in) 1 x Per Week/30 Days Discharge Instructions: Apply Hydrofera Blue Ready to wound bed as directed Secondary Dressing: ABD Pad 5x9 (in/in) 1 x Per Week/30 Days Discharge Instructions: Cover with ABD pad. Compression Wrap: Profore Lite LF 3 Multilayer Compression Bandaging System 1 x Per Week/30 Days Discharge Instructions: Apply 3 multi-layer wrap as prescribed. WOUND #3: - Foot Wound Laterality: Left Cleanser: Soap and Water 1 x Per Week/30 Days Discharge Instructions: Gently cleanse wound with antibacterial soap, rinse and pat dry prior to dressing wounds Primary Dressing: Iodosorb 40 (g) 1 x Per Week/30 Days Discharge Instructions: Apply IodoSorb to wound bed only as directed. Secondary Dressing: ABD Pad 5x9 (in/in) 1 x Per Week/30 Days Discharge Instructions: Cover with ABD pad Compression Wrap: Profore Lite LF 3 Multilayer Compression Bandaging System 1 x Per Week/30 Days Discharge Instructions: Apply 3 multi-layer wrap as prescribed. 1. Would recommend currently that we continue with the wound care measures as before and the patient is in agreement the plan this includes the use of the Iodosorb to the main foot ulcer I think this is doing a great job. Sanpedro, Analaya L. ([id number]) 2. I am also going to recommend that we have the patient continue with the Hydrofera Blue to the heel which I think is doing excellent. 3. We will continue with a 3 layer compression wrap which is keeping the edema under good control. We will see patient back for reevaluation in 1 week here in the clinic. If anything worsens or changes patient will contact our office for additional recommendations. Electronic Signature(s) Signed: 10/09/2020 5:22:52 PM By: Worthy Keeler PA-C Entered By: Worthy Keeler on 10/09/2020 17:22:52 Setterlund, Mariajose L. ([id number]) -------------------------------------------------------------------------------- SuperBill Details Patient Name: Lippe,  Tamsin L. Date of Service: 10/09/2020 Medical Record Number: [id number] Patient Account Number: [id number] Date of Birth/Sex: 1926/06/20 (87 y.o. F) Treating RN: Donnamarie Poag Primary Care Provider: [name] Other Clinician: Referring Provider: [name] Treating Provider/Extender: Skipper Cliche in Treatment: 15 Diagnosis Coding ICD-10 Codes Code Description [id number] Pressure ulcer of left heel, stage 3 L89.893 Pressure ulcer of other site, stage 3 I10 Essential (primary) hypertension F01.50 Vascular dementia without behavioral disturbance I25.10 Atherosclerotic heart disease of native coronary artery without angina pectoris Facility Procedures CPT4 Code: [id number] Description: [id number] - DEB SUBQ TISSUE 20 SQ CM/< Modifier: Quantity: 1 CPT4 Code: Description: ICD-10 Diagnosis Description L89.623 Pressure ulcer of left heel, stage 3 L89.893 Pressure ulcer of other site, stage 3 Modifier: Quantity: Physician Procedures CPT4 Code: [id number] Description: 11042 - WC PHYS SUBQ TISS 20 SQ CM Modifier: Quantity: 1 CPT4 Code: Description: ICD-10 Diagnosis Description L89.623 Pressure ulcer of left heel, stage 3 L89.893 Pressure ulcer of other site, stage 3 Modifier: Quantity: Electronic Signature(s) Signed: 10/09/2020 5:23:28 PM By: Worthy Keeler PA-C Previous Signature: 10/09/2020 4:46:26 PM Version By: Donnamarie Poag Entered By: Worthy Keeler on 10/09/2020 17:23:28

## 2020-10-16 ENCOUNTER — Other Ambulatory Visit: Payer: Self-pay

## 2020-10-16 ENCOUNTER — Encounter: Payer: PPO | Admitting: Physician Assistant

## 2020-10-16 DIAGNOSIS — L89623 Pressure ulcer of left heel, stage 3: Secondary | ICD-10-CM | POA: Diagnosis not present

## 2020-10-16 DIAGNOSIS — L97519 Non-pressure chronic ulcer of other part of right foot with unspecified severity: Secondary | ICD-10-CM | POA: Diagnosis not present

## 2020-10-16 DIAGNOSIS — L89893 Pressure ulcer of other site, stage 3: Secondary | ICD-10-CM | POA: Diagnosis not present

## 2020-10-16 NOTE — Progress Notes (Signed)
Salih, Leeanna L. ([id number]) Visit Report for 10/16/2020 Arrival Information Details Patient Name: [name] [name], [name] Date of Service: 10/16/2020 11:00 AM Medical Record Number: [id number] Patient Account Number: [id number] Date of Birth/Sex: 01/25/1927 (87 y.o. F) Treating RN: Bishop, Joy Primary Care : Fisher, Donald Other Clinician: Referring : Fisher, Donald Treating /Extender: Stone, Hoyt Weeks in Treatment: 16 Visit Information History Since Last Visit Added or deleted any medications: No Patient Arrived: Wheel Chair Had a fall or experienced change in No Arrival Time: 11:00 activities of daily living that may affect Accompanied By: daughter risk of falls: Transfer Assistance: EasyPivot Patient Lift Hospitalized since last visit: No Patient Identification Verified: Yes Has Dressing in Place as Prescribed: Yes Secondary Verification Process Completed: Yes Has Compression in Place as Prescribed: Yes Patient Requires Transmission-Based No Pain Present Now: No Precautions: Patient Has Alerts: Yes Patient Alerts: NOT diabetic ABI R1.18 L1.25 06/27/20 Electronic Signature(s) Signed: 10/16/2020 3:54:00 PM By: Bishop, Joy Entered By: Bishop, Joy on 10/16/2020 11:06:22 Adames, Carmelia L. ([id number]) -------------------------------------------------------------------------------- Clinic Level of Care Assessment Details Patient Name: [name], [name] Date of Service: 10/16/2020 11:00 AM Medical Record Number: [id number] Patient Account Number: [id number] Date of Birth/Sex: 07/10/1926 (87 y.o. F) Treating RN: Bishop, Joy Primary Care : Fisher, Donald Other Clinician: Referring : Fisher, Donald Treating /Extender: Stone, Hoyt Weeks in Treatment: 16 Clinic Level of Care Assessment Items TOOL 1 Quantity Score [] - Use when EandM and Procedure is performed on INITIAL visit 0 ASSESSMENTS - Nursing Assessment / Reassessment [] -  General Physical Exam (combine w/ comprehensive assessment (listed just below) when performed on new 0 pt. evals) [] - 0 Comprehensive Assessment (HX, ROS, Risk Assessments, Wounds Hx, etc.) ASSESSMENTS - Wound and Skin Assessment / Reassessment [] - Dermatologic / Skin Assessment (not related to wound area) 0 ASSESSMENTS - Ostomy and/or Continence Assessment and Care [] - Incontinence Assessment and Management 0 [] - 0 Ostomy Care Assessment and Management (repouching, etc.) PROCESS - Coordination of Care [] - Simple Patient / Family Education for ongoing care 0 [] - 0 Complex (extensive) Patient / Family Education for ongoing care [] - 0 Staff obtains Consents, Records, Test Results / Process Orders [] - 0 Staff telephones HHA, Nursing Homes / Clarify orders / etc [] - 0 Routine Transfer to another Facility (non-emergent condition) [] - 0 Routine Hospital Admission (non-emergent condition) [] - 0 New Admissions / Insurance Authorizations / Ordering NPWT, Apligraf, etc. [] - 0 Emergency Hospital Admission (emergent condition) PROCESS - Special Needs [] - Pediatric / Minor Patient Management 0 [] - 0 Isolation Patient Management [] - 0 Hearing / Language / Visual special needs [] - 0 Assessment of Community assistance (transportation, D/C planning, etc.) [] - 0 Additional assistance / Altered mentation [] - 0 Support Surface(s) Assessment (bed, cushion, seat, etc.) INTERVENTIONS - Miscellaneous [] - External ear exam 0 [] - 0 Patient Transfer (multiple staff / Hoyer Lift / Similar devices) [] - 0 Simple Staple / Suture removal (25 or less) [] - 0 Complex Staple / Suture removal (26 or more) [] - 0 Hypo/Hyperglycemic Management (do not check if billed separately) [] - 0 Ankle / Brachial Index (ABI) - do not check if billed separately Has the patient been seen at the hospital within the last three years: Yes Total Score: 0 Level Of Care: ____ Tartt, Tatum L.  ([id number]) Electronic Signature(s) Signed: 10/16/2020 3:54:00 PM By: Bishop, Joy Entered By: Bishop, Joy on 10/16/2020 11:32:27 Upham, Mauriah L. (  [id number]) -------------------------------------------------------------------------------- Encounter Discharge Information Details Patient Name: [name], [name] Date of Service: 10/16/2020 11:00 AM Medical Record Number: [id number] Patient Account Number: [id number] Date of Birth/Sex: 1927/01/26 (87 y.o. F) Treating RN: Donnamarie Poag Primary Care Antonela Freiman: [name] Other Clinician: Referring Pheng Prokop: [name] Treating Liam Cammarata/Extender: Skipper Cliche in Treatment: 16 Encounter Discharge Information Items Post Procedure Vitals Discharge Condition: Stable Temperature (F): 98.2 Ambulatory Status: Wheelchair Pulse (bpm): 66 Discharge Destination: Home Respiratory Rate (breaths/min): 16 Transportation: Private Auto Blood Pressure (mmHg): 148/69 Accompanied By: daughter Schedule Follow-up Appointment: Yes Clinical Summary of Care: Electronic Signature(s) Signed: 10/16/2020 3:54:00 PM By: Donnamarie Poag Entered ByDonnamarie Poag on 10/16/2020 11:33:40 Negrette, Adelis L. ([id number]) -------------------------------------------------------------------------------- Lower Extremity Assessment Details Patient Name: [name], [name] Date of Service: 10/16/2020 11:00 AM Medical Record Number: [id number] Patient Account Number: [id number] Date of Birth/Sex: 1926-06-14 (87 y.o. F) Treating RN: Donnamarie Poag Primary Care Geet Hosking: [name] Other Clinician: Referring Jaiveer Panas: [name] Treating Oreatha Fabry/Extender: Skipper Cliche in Treatment: 16 Edema Assessment Assessed: Shirlyn Goltz: Yes] Patrice Paradise: No] Edema: [Left: N] [Right: o] Calf Left: Right: Point of Measurement: 32 cm From Medial Instep 31.5 cm Ankle Left: Right: Point of Measurement: 12 cm From Medial Instep 21.8 cm Vascular Assessment Pulses: Dorsalis  Pedis Palpable: [Left:Yes] Electronic Signature(s) Signed: 10/16/2020 3:54:00 PM By: Donnamarie Poag Entered ByDonnamarie Poag on 10/16/2020 11:21:40 Nessel, Ledonna L. ([id number]) -------------------------------------------------------------------------------- Multi Wound Chart Details Patient Name: Dudenhoeffer, Brion L. Date of Service: 10/16/2020 11:00 AM Medical Record Number: [id number] Patient Account Number: [id number] Date of Birth/Sex: May 07, 1926 (87 y.o. F) Treating RN: Donnamarie Poag Primary Care Amaryllis Malmquist: [name] Other Clinician: Referring Sajad Glander: [name] Treating Dhara Schepp/Extender: Skipper Cliche in Treatment: 16 Vital Signs Height(in): 32 Pulse(bpm): 50 Weight(lbs): 137 Blood Pressure(mmHg): 148/69 Body Mass Index(BMI): 29 Temperature(F): 98.2 Respiratory Rate(breaths/min): 16 Photos: [N/A:N/A] Wound Location: Left, Medial Calcaneus Left Foot N/A Wounding Event: Pressure Injury Pressure Injury N/A Primary Etiology: Pressure Ulcer Pressure Ulcer N/A Comorbid History: Cataracts, Anemia, Coronary Artery Cataracts, Anemia, Coronary Artery N/A Disease, Hypertension, Myocardial Disease, Hypertension, Myocardial Infarction, End Stage Renal Infarction, End Stage Renal Disease, Gout, Osteoarthritis, Disease, Gout, Osteoarthritis, Dementia Dementia Date Acquired: 04/11/2020 10/02/2020 N/A Weeks of Treatment: 16 2 N/A Wound Status: Open Open N/A Measurements L x W x D (cm) 0.2x0.2x0.2 0.4x0.5x0.4 N/A Area (cm) : 0.031 0.157 N/A Volume (cm) : 0.006 0.063 N/A % Reduction in Area: 97.40% 50.00% N/A % Reduction in Volume: 98.70% -103.20% N/A Classification: Category/Stage III Category/Stage III N/A Exudate Amount: Medium Medium N/A Exudate Type: Serosanguineous Serosanguineous N/A Exudate Color: red, brown red, brown N/A Wound Margin: N/A Thickened N/A Granulation Amount: Large (67-100%) Small (1-33%) N/A Granulation Quality: Pink, Pale Red, Pink N/A Necrotic  Amount: Small (1-33%) Large (67-100%) N/A Exposed Structures: Fat Layer (Subcutaneous Tissue): Fat Layer (Subcutaneous Tissue): N/A Yes Yes Fascia: No Fascia: No Tendon: No Tendon: No Muscle: No Muscle: No Joint: No Joint: No Bone: No Bone: No Epithelialization: None N/A N/A Treatment Notes Electronic Signature(s) Signed: 10/16/2020 3:54:00 PM By: Donnamarie Poag Entered By: Donnamarie Poag on 10/16/2020 11:26:22 Luczynski, Billie L. ([id number]) -------------------------------------------------------------------------------- [place] Details Patient Name: Fitzgibbons, Dalexa L. Date of Service: 10/16/2020 11:00 AM Medical Record Number: [id number] Patient Account Number: [id number] Date of Birth/Sex: [birth year] (87 y.o. F) Treating RN: Donnamarie Poag Primary Care Palyn Scrima: [name] Other Clinician: Referring Ikeya Brockel: [name] Treating Sheina Mcleish/Extender: Skipper Cliche in Treatment: 16 Active Inactive Wound/Skin Impairment Nursing Diagnoses: Impaired tissue integrity Goals: Patient/caregiver will verbalize understanding of  skin care regimen Date Initiated: 06/26/2020 Date Inactivated: 08/21/2020 Target Resolution Date: 06/26/2020 Goal Status: Met Ulcer/skin breakdown will have a volume reduction of 30% by week 4 Date Initiated: 06/26/2020 Date Inactivated: 08/21/2020 Target Resolution Date: 07/27/2020 Goal Status: Met Ulcer/skin breakdown will have a volume reduction of 50% by week 8 Date Initiated: 06/26/2020 Date Inactivated: 09/18/2020 Target Resolution Date: 08/26/2020 Goal Status: Unmet Unmet Reason: con't tx Ulcer/skin breakdown will have a volume reduction of 80% by week 12 Date Initiated: 06/26/2020 Target Resolution Date: 09/26/2020 Goal Status: Active Ulcer/skin breakdown will heal within 14 weeks Date Initiated: 06/26/2020 Target Resolution Date: 10/27/2020 Goal Status: Active Interventions: Assess patient/caregiver ability to obtain necessary  supplies Assess patient/caregiver ability to perform ulcer/skin care regimen upon admission and as needed Assess ulceration(s) every visit Provide education on ulcer and skin care Treatment Activities: Referred to DME Verta Riedlinger for dressing supplies : 06/26/2020 Skin care regimen initiated : 06/26/2020 Notes: Electronic Signature(s) Signed: 10/16/2020 3:54:00 PM By: Donnamarie Poag Entered ByDonnamarie Poag on 10/16/2020 11:26:12 Paccione, [place]well. ([id number]) -------------------------------------------------------------------------------- Pain Assessment Details Patient Name: Colello, Joliyah L. Date of Service: 10/16/2020 11:00 AM Medical Record Number: [id number] Patient Account Number: [id number] Date of Birth/Sex: 1926-03-11 (87 y.o. F) Treating RN: Donnamarie Poag Primary Care Michelena Culmer: [name] Other Clinician: Referring Luvenia Cranford: [name] Treating Lavance Beazer/Extender: Skipper Cliche in Treatment: 16 Active Problems Location of Pain Severity and Description of Pain Patient Has Paino No Site Locations Rate the pain. Current Pain Level: 0 Pain Management and Medication Current Pain Management: Electronic Signature(s) Signed: 10/16/2020 3:54:00 PM By: Donnamarie Poag Entered By: Donnamarie Poag on 10/16/2020 11:15:09 Mazzola, Nikolai ([id number]) -------------------------------------------------------------------------------- Patient/Caregiver Education Details Patient Name: Martorelli, Raeanne L. Date of Service: 10/16/2020 11:00 AM Medical Record Number: [id number] Patient Account Number: [id number] Date of Birth/Gender: 17-Apr-1926 (87 y.o. F) Treating RN: Donnamarie Poag Primary Care Physician: [name] Other Clinician: Referring Physician: [name] Treating Physician/Extender: Skipper Cliche in Treatment: 16 Education Assessment Education Provided To: Patient and Caregiver Education Topics Provided Wound Debridement: Wound/Skin Impairment: Electronic  Signature(s) Signed: 10/16/2020 3:54:00 PM By: Donnamarie Poag Entered ByDonnamarie Poag on 10/16/2020 11:32:49 Helbig, Victorious L. ([id number]) -------------------------------------------------------------------------------- Wound Assessment Details Patient Name: Scheper, Neal L. Date of Service: 10/16/2020 11:00 AM Medical Record Number: [id number] Patient Account Number: [id number] Date of Birth/Sex: Aug 13, 1926 (87 y.o. F) Treating RN: Donnamarie Poag Primary Care Avigail Pilling: [name] Other Clinician: Referring Vonnie Spagnolo: [name] Treating Shanquita Ronning/Extender: Skipper Cliche in Treatment: 16 Wound Status Wound Number: 2 Primary Pressure Ulcer Etiology: Wound Location: Left, Medial Calcaneus Wound Open Wounding Event: Pressure Injury Status: Date Acquired: 04/11/2020 Comorbid Cataracts, Anemia, Coronary Artery Disease, Weeks Of Treatment: 16 History: Hypertension, Myocardial Infarction, End Stage Renal Clustered Wound: No Disease, Gout, Osteoarthritis, Dementia Photos Wound Measurements Length: (cm) 0.2 Width: (cm) 0.2 Depth: (cm) 0.2 Area: (cm) 0.031 Volume: (cm) 0.006 % Reduction in Area: 97.4% % Reduction in Volume: 98.7% Epithelialization: None Tunneling: No Undermining: No Wound Description Classification: Category/Stage III Exudate Amount: Medium Exudate Type: Serosanguineous Exudate Color: red, brown Foul Odor After Cleansing: No Slough/Fibrino Yes Wound Bed Granulation Amount: Large (67-100%) Exposed Structure Granulation Quality: Pink, Pale Fascia Exposed: No Necrotic Amount: Small (1-33%) Fat Layer (Subcutaneous Tissue) Exposed: Yes Necrotic Quality: Adherent Slough Tendon Exposed: No Muscle Exposed: No Joint Exposed: No Bone Exposed: No Treatment Notes Wound #2 (Calcaneus) Wound Laterality: Left, Medial Cleanser Soap and Water Discharge Instruction: Gently cleanse wound with antibacterial soap, rinse and pat dry prior to dressing  wounds  Peri-Wound Care Check, Jakeira L. ([id number]) Topical Primary Dressing Prisma 4.34 (in) Discharge Instruction: Moisten w/normal saline or sterile water; Cover wound as directed. Do not remove from wound bed. Secondary Dressing ABD Pad 5x9 (in/in) Discharge Instruction: Cover with ABD pad. Secured With Compression Wrap Profore Lite LF 3 Multilayer Compression Bandaging System Discharge Instruction: Apply 3 multi-layer wrap as prescribed. Compression Stockings Add-Ons Electronic Signature(s) Signed: 10/16/2020 3:54:00 PM By: Donnamarie Poag Entered By: Donnamarie Poag on 10/16/2020 11:18:43 Kirkland, Yoona L. ([id number]) -------------------------------------------------------------------------------- Wound Assessment Details Patient Name: Hulon, Georgian L. Date of Service: 10/16/2020 11:00 AM Medical Record Number: [id number] Patient Account Number: [id number] Date of Birth/Sex: [birth year] (87 y.o. F) Treating RN: Donnamarie Poag Primary Care Jory Welke: [name] Other Clinician: Referring Dianelys Scinto: [name] Treating Keshun Berrett/Extender: Skipper Cliche in Treatment: 16 Wound Status Wound Number: 3 Primary Pressure Ulcer Etiology: Wound Location: Left Foot Wound Open Wounding Event: Pressure Injury Status: Date Acquired: 10/02/2020 Comorbid Cataracts, Anemia, Coronary Artery Disease, Weeks Of Treatment: 2 History: Hypertension, Myocardial Infarction, End Stage Renal Clustered Wound: No Disease, Gout, Osteoarthritis, Dementia Photos Wound Measurements Length: (cm) 0.4 Width: (cm) 0.5 Depth: (cm) 0.4 Area: (cm) 0.157 Volume: (cm) 0.063 % Reduction in Area: 50% % Reduction in Volume: -103.2% Tunneling: No Undermining: No Wound Description Classification: Category/Stage III Wound Margin: Thickened Exudate Amount: Medium Exudate Type: Serosanguineous Exudate Color: red, brown Foul Odor After Cleansing: No Slough/Fibrino Yes Wound Bed Granulation Amount:  Small (1-33%) Exposed Structure Granulation Quality: Red, Pink Fascia Exposed: No Necrotic Amount: Large (67-100%) Fat Layer (Subcutaneous Tissue) Exposed: Yes Necrotic Quality: Adherent Slough Tendon Exposed: No Muscle Exposed: No Joint Exposed: No Bone Exposed: No Treatment Notes Wound #3 (Foot) Wound Laterality: Left Cleanser Soap and Water Discharge Instruction: Gently cleanse wound with antibacterial soap, rinse and pat dry prior to dressing wounds Cade, Kirk L. ([id number]) Peri-Wound Care Topical Primary Dressing Iodosorb 40 (g) Discharge Instruction: Apply IodoSorb to wound bed only as directed. Secondary Dressing ABD Pad 5x9 (in/in) Discharge Instruction: Cover with ABD pad Secured With Compression Wrap Profore Lite LF 3 Multilayer Compression Bandaging System Discharge Instruction: Apply 3 multi-layer wrap as prescribed. Compression Stockings Add-Ons Electronic Signature(s) Signed: 10/16/2020 3:54:00 PM By: Donnamarie Poag Entered By: Donnamarie Poag on 10/16/2020 11:20:31 Rios, [place] ([id number]) -------------------------------------------------------------------------------- Vitals Details Patient Name: Drab, Shashana L. Date of Service: 10/16/2020 11:00 AM Medical Record Number: [id number] Patient Account Number: [id number] Date of Birth/Sex: [birth year] (87 y.o. F) Treating RN: Donnamarie Poag Primary Care Adilson Grafton: [name] Other Clinician: Referring Jujhar Everett: [name] Treating Ndeye Tenorio/Extender: Skipper Cliche in Treatment: 16 Vital Signs Time Taken: 11:06 Temperature (F): 98.2 Height (in): 58 Pulse (bpm): 66 Weight (lbs): 137 Respiratory Rate (breaths/min): 16 Body Mass Index (BMI): 28.6 Blood Pressure (mmHg): 148/69 Reference Range: 80 - 120 mg / dl Electronic Signature(s) Signed: 10/16/2020 3:54:00 PM By: Donnamarie Poag Entered ByDonnamarie Poag on 10/16/2020 11:09:27

## 2020-10-16 NOTE — Progress Notes (Addendum)
[name], [name] ([id number]) Visit Report for 10/16/2020 Chief Complaint Document Details Patient Name: [name], [name] Date of Service: 10/16/2020 11:00 AM Medical Record Number: [id number] Patient Account Number: [id number] Date of Birth/Sex: Mar 05, 1926 (87 y.o. F) Treating RN: Donnamarie Poag Primary Care Provider: [name] Other Clinician: Referring Provider: [name] Treating Provider/Extender: Skipper Cliche in Treatment: 16 Information Obtained from: Patient Chief Complaint Left Heel Pressure Ulcer Electronic Signature(s) Signed: 10/16/2020 11:09:25 AM By: Worthy Keeler PA-C Entered By: Worthy Keeler on 10/16/2020 11:09:25 [name], [name] ([id number]) -------------------------------------------------------------------------------- Debridement Details Patient Name: [name], [name] Date of Service: 10/16/2020 11:00 AM Medical Record Number: [id number] Patient Account Number: [id number] Date of Birth/Sex: 01/25/1927 (87 y.o. F) Treating RN: Donnamarie Poag Primary Care Provider: [name] Other Clinician: Referring Provider: [name] Treating Provider/Extender: Skipper Cliche in Treatment: 16 Debridement Performed for Wound #2 Left,Medial Calcaneus Assessment: Performed By: Physician Tommie Sams., PA-C Debridement Type: Debridement Level of Consciousness (Pre- Awake and Alert procedure): Pre-procedure Verification/Time Out Yes - 11:27 Taken: Start Time: 11:27 Pain Control: Lidocaine Total Area Debrided (L x W): 0.4 (cm) x 0.2 (cm) = 0.08 (cm) Tissue and other material Viable, Non-Viable, Callus, Slough, Subcutaneous, Biofilm, Slough debrided: Level: Skin/Subcutaneous Tissue Debridement Description: Excisional Instrument: Curette Bleeding: Minimum Hemostasis Achieved: Pressure End Time: 11:31 Response to Treatment: Procedure was tolerated well Level of Consciousness (Post- Awake and Alert procedure): Post Debridement  Measurements of Total Wound Length: (cm) 0.2 Stage: Category/Stage III Width: (cm) 0.2 Depth: (cm) 0.2 Volume: (cm) 0.006 Character of Wound/Ulcer Post Debridement: Improved Post Procedure Diagnosis Same as Pre-procedure Electronic Signature(s) Signed: 10/16/2020 3:54:00 PM By: Donnamarie Poag Signed: 10/16/2020 5:58:25 PM By: Worthy Keeler PA-C Entered By: Donnamarie Poag on 10/16/2020 11:29:54 [name], [name] ([id number]) -------------------------------------------------------------------------------- Debridement Details Patient Name: [name], [name] Date of Service: 10/16/2020 11:00 AM Medical Record Number: [id number] Patient Account Number: [id number] Date of Birth/Sex: 1926/09/27 (87 y.o. F) Treating RN: Donnamarie Poag Primary Care Provider: [name] Other Clinician: Referring Provider: [name] Treating Provider/Extender: Skipper Cliche in Treatment: 16 Debridement Performed for Wound #3 Left Foot Assessment: Performed By: Physician Tommie Sams., PA-C Debridement Type: Debridement Level of Consciousness (Pre- Awake and Alert procedure): Pre-procedure Verification/Time Out Yes - 11:27 Taken: Start Time: 11:31 Pain Control: Lidocaine Total Area Debrided (L x W): 0.4 (cm) x 0.5 (cm) = 0.2 (cm) Tissue and other material Viable, Non-Viable, Callus, Slough, Subcutaneous, Biofilm, Slough debrided: Level: Skin/Subcutaneous Tissue Debridement Description: Excisional Instrument: Curette Bleeding: Minimum Hemostasis Achieved: Pressure End Time: 11:35 Response to Treatment: Procedure was tolerated well Level of Consciousness (Post- Awake and Alert procedure): Post Debridement Measurements of Total Wound Length: (cm) 0.4 Stage: Category/Stage III Width: (cm) 0.5 Depth: (cm) 0.4 Volume: (cm) 0.063 Character of Wound/Ulcer Post Debridement: Improved Post Procedure Diagnosis Same as Pre-procedure Electronic Signature(s) Signed: 10/16/2020 3:54:00 PM By:  Donnamarie Poag Signed: 10/16/2020 5:58:25 PM By: Worthy Keeler PA-C Entered By: Donnamarie Poag on 10/16/2020 11:31:03 [name], [name] ([id number]) -------------------------------------------------------------------------------- HPI Details Patient Name: [name], [name] Date of Service: 10/16/2020 11:00 AM Medical Record Number: [id number] Patient Account Number: [id number] Date of Birth/Sex: Nov 02, 1926 (87 y.o. F) Treating RN: Donnamarie Poag Primary Care Provider: [name] Other Clinician: Referring Provider: [name] Treating Provider/Extender: Skipper Cliche in Treatment: 16 History of Present Illness HPI Description: 87 year old patient was recently seen by the PCPs office for significant pain right great toe which has been going on since July. She was initially treated  with Keflex which she did not complete and after the office visit this time she has been put on doxycycline. at the time of her visit she was found to have a ulcer on the plantar surface of the right great toe and also had a pyogenic granuloma over this area. X-ray of the right foot done 12/29/2014 -- IMPRESSION:Soft-tissue swelling and ulceration right great toe. No underlying bony lytic lesion identified. If osteomyelitis remains of clinical concern MRI can be obtained. Past medical history significant for anemia, chronic kidney disease stage III, obesity, varicose veins, coronary artery disease, gout, history of nicotine addiction given up smoking in 2002, hypertension, status post cardiac catheterization, status post abdominal hysterectomy, cholecystectomy and tonsillectomy. hemoglobin A1c done in August was 5.8 01/22/2015 -- at this stage the [facility] Walking boat was going to cost them significant amount of money and they want to defer using that at the present time. 01/29/2015 -- she had a podiatry appointment and they have trimmed her toenails. She has not heard back from the vascular office regarding  her venous duplex study and I have asked them to call personally so that they can get the appointment soon. 02/06/2015 -- they have made contact with the vascular office and from what I understand that test has been done but the report is pending. 02/19/2014 -- the vascular test is scheduled for tomorrow Readmission: 06/26/2020 upon evaluation today patient appears for initial evaluation in her clinic that she has been here before in 2016 and has been quite sometime. She did have a fractured hip in February on the 23rd 2022. Subsequently this had to be pinned and she ended up with a pressure injury on her heel following when she was using her foot to help move her around in the bed. Subsequently this has led to the wound that she has been dealing with since that time. She lives at home with her daughter currently she does have dementia. 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 07/03/2020 upon evaluation today patient appears to be doing well with regard to her heel ulcer. She did have arterial studies they appear to be doing excellent it was premature normal across the board with TBI's in the 90s and ABIs well within normal range. Nonetheless there does not appear to be any signs of arterial insufficiency whatsoever. With that being said the patient does have also signs of improvement there is some necrotic tissue in the base of the wound number to try to clear some of that away today I do believe the Iodoflex/Iodosorb is doing well. 5/24; difficult punched out wound on the left medial heel. She has been using Iodoflex 07/17/2020 upon evaluation today patient appears to be doing well at this point in regard to her wound. I do feel like this is a little bit deeper but again that is what is expected as we continue with the Iodoflex I think that this is just going to get  deeper until we get to the base of the wound. With that being said I think we are getting much closer to the base of the wound where we can have healthier tissue that we get a be managing here which that will be awesome. In the meantime I am not surprised by what I am seeing and in fact the wound appears to be better as compared to previous findings. 07/24/2020 upon evaluation today patient  appears to be doing well with regard to her wound. Overall I am extremely pleased with where things stand today. I do not see any signs of active infection which is great and overall I think that the patient is making good progress. There is good to be some need for sharp debridement today. 07/31/2020 upon evaluation today patient appears to be doing better in regard to her heel ulcer. She has been tolerating the dressing changes without complication. Fortunately there does not appear to be any signs of active infection which is great and overall very pleased in this regard. No fevers, chills, nausea, vomiting, or diarrhea. 08/14/2020 upon evaluation today patient appears to be doing better in regard to her heel ulcer. I am very pleased in that regard. Unfortunately she still has a slight deep tissue injury in regard to the medial portion of her foot over the same area. Unfortunately I think this is something that if were not careful it was can open up into the wound. That is my main concern here based on what I see. Fortunately there does not appear to be any evidence of active infection which is great news and overall very pleased with where things stand at this point. 08/21/2020 upon evaluation today patient appears to be doing well with regard to her wound. She has been tolerating the dressing changes without complication. Fortunately there does not appear to be any signs of active infection at this time. No fevers, chills, nausea, vomiting, or diarrhea. I do believe the compression wrap was beneficial for  her. 08/28/2020 upon evaluation today patient appears to be doing well with regard to her wounds currently. Fortunately there does not appear to be any signs of active infection at this time. No fevers, chills, nausea, vomiting, or diarrhea. With that being said I think that her leg is doing quite well to be honest. 09/04/2020 upon evaluation today patient appears to be doing well with regard to her wound on the heel. This is showing signs of good epithelial growth although there is probably can it be an indention where this heals that is okay as long as we get it closed. Fortunately I do not see any evidence of infection at this point. 09/13/2020 upon evaluation today patient appears to be doing well with regard to her wounds. She has been tolerating the dressing changes [name], [name] ([id number]) without complication. Fortunately he is actually doing extremely well and I think she is making great progress this is measuring smaller. I would recommend that such that we continue with the [facility] likely since he is doing so well. 09/18/2020 upon evaluation today patient appears to be doing well with regard to her heel ulcer. She is making good progress and I am very pleased with what we see today. I think the Hydrofera Blue is still doing excellent. 10/02/2020 upon evaluation today patient's wound actually appears to be showing signs of good improvement in regard to the heel. I am very pleased in this regard. With that being said I do believe that the area which is somewhat been stable and dry is starting to lift up and beginning to clear this away as well that is on the foot. Subsequently I am going to have to perform some debridement here and we did actually go ahead and allow this as a wound today as before it was just more deep tissue injury and eschar covering now I think it is a little bit more than that to be honest. 10/09/2020  upon evaluation today patient appears to be doing decently  well in regard to her wounds. I am actually very pleased with where things stand today. There does not appear to be any signs of active infection which is great news. No fevers, chills, nausea, vomiting, or diarrhea. She is going require some sharp debridement today. 10/16/2020 upon evaluation today patient appears to be doing decently well in regard to her heel as well as the foot. Both are showing signs of good improvement which is great news. In general and extremely pleased with where things stand at this point. She is tolerating the [facility] well although this is getting so small in the heel I think collagen may be a better option here based on what I am seeing. With regard to the foot the Iodoflex/Iodosorb is still probably the best method here. Electronic Signature(s) Signed: 10/16/2020 12:47:34 PM By: Worthy Keeler PA-C Entered By: Worthy Keeler on 10/16/2020 12:47:34 [name], [name] ([id number]) -------------------------------------------------------------------------------- Physical Exam Details Patient Name: Fridman, Kattleya L. Date of Service: 10/16/2020 11:00 AM Medical Record Number: [id number] Patient Account Number: [id number] Date of Birth/Sex: 1926/09/27 (87 y.o. F) Treating RN: Donnamarie Poag Primary Care Provider: [name] Other Clinician: Referring Provider: [name] Treating Provider/Extender: Skipper Cliche in Treatment: 94 Constitutional Well-nourished and well-hydrated in no acute distress. Respiratory normal breathing without difficulty. Psychiatric this patient is able to make decisions and demonstrates good insight into disease process. Alert and Oriented x 3. pleasant and cooperative. Notes Upon inspection patient's wounds again showed signs of good granulation epithelization at this point. Fortunately there does not appear to be any signs of active infection which is great news and overall very pleased with where things stand. Sharp  debridement was performed to clear away some of the necrotic debris in regard to the wounds at both locations as well as some of the callus and she tolerated the debridement today without complication. Electronic Signature(s) Signed: 10/16/2020 12:48:11 PM By: Worthy Keeler PA-C Entered By: Worthy Keeler on 10/16/2020 12:48:11 [name], [name] ([id number]) -------------------------------------------------------------------------------- Physician Orders Details Patient Name: [name], [name] Date of Service: 10/16/2020 11:00 AM Medical Record Number: [id number] Patient Account Number: [id number] Date of Birth/Sex: [birth year] (87 y.o. F) Treating RN: Donnamarie Poag Primary Care Provider: [name] Other Clinician: Referring Provider: [name] Treating Provider/Extender: Skipper Cliche in Treatment: 16 Verbal / Phone Orders: No Diagnosis Coding ICD-10 Coding Code Description [phone] Pressure ulcer of left heel, stage 3 L89.893 Pressure ulcer of other site, stage 3 I10 Essential (primary) hypertension F01.50 Vascular dementia without behavioral disturbance I25.10 Atherosclerotic heart disease of native coronary artery without angina pectoris Follow-up Appointments o Return Appointment in 1 week. o Nurse Visit as needed Bathing/ Shower/ Hygiene o May shower with wound dressing protected with water repellent cover or cast protector. o No tub bath. Edema Control - Lymphedema / Segmental Compressive Device / Other o Patient to wear own compression stockings. Remove compression stockings every night before going to bed and put on every morning when getting up. - right leg o Elevate legs to the level of the heart and pump ankles as often as possible o Elevate leg(s) parallel to the floor when sitting. o DO YOUR BEST to sleep in the bed at night. DO NOT sleep in your recliner. Long hours of sitting in a recliner leads to swelling of the legs and/or potential  wounds on your backside. Off-Loading o Other: - Offloading boots while in bed; Try pillow  under sheet to keep it in place. Wound Treatment Wound #2 - Calcaneus Wound Laterality: Left, Medial Cleanser: Soap and Water 1 x Per Week/30 Days Discharge Instructions: Gently cleanse wound with antibacterial soap, rinse and pat dry prior to dressing wounds Primary Dressing: Prisma 4.34 (in) 1 x Per Week/30 Days Discharge Instructions: Moisten w/normal saline or sterile water; Cover wound as directed. Do not remove from wound bed. Secondary Dressing: ABD Pad 5x9 (in/in) 1 x Per Week/30 Days Discharge Instructions: Cover with ABD pad. Compression Wrap: Profore Lite LF 3 Multilayer Compression Bandaging System 1 x Per Week/30 Days Discharge Instructions: Apply 3 multi-layer wrap as prescribed. Wound #3 - Foot Wound Laterality: Left Cleanser: Soap and Water 1 x Per Week/30 Days Discharge Instructions: Gently cleanse wound with antibacterial soap, rinse and pat dry prior to dressing wounds Primary Dressing: Iodosorb 40 (g) 1 x Per Week/30 Days Discharge Instructions: Apply IodoSorb to wound bed only as directed. Secondary Dressing: ABD Pad 5x9 (in/in) 1 x Per Week/30 Days Discharge Instructions: Cover with ABD pad Compression Wrap: Profore Lite LF 3 Multilayer Compression Bandaging System 1 x Per Week/30 Days Discharge Instructions: Apply 3 multi-layer wrap as prescribed. [name], [name] ([id number]) Electronic Signature(s) Signed: 10/16/2020 3:54:00 PM By: Donnamarie Poag Signed: 10/16/2020 5:58:25 PM By: Worthy Keeler PA-C Entered By: Donnamarie Poag on 10/16/2020 11:31:54 Bankert, Goddard ([id number]) -------------------------------------------------------------------------------- Problem List Details Patient Name: Mehring, Sumedha L. Date of Service: 10/16/2020 11:00 AM Medical Record Number: [id number] Patient Account Number: [id number] Date of Birth/Sex: [birth year] (87 y.o. F) Treating RN: Donnamarie Poag Primary Care Provider: [name] Other Clinician: Referring Provider: [name] Treating Provider/Extender: Skipper Cliche in Treatment: 16 Active Problems ICD-10 Encounter Code Description Active Date MDM Diagnosis [phone] Pressure ulcer of left heel, stage 3 06/26/2020 No Yes L89.893 Pressure ulcer of other site, stage 3 10/02/2020 No Yes I10 Essential (primary) hypertension 06/26/2020 No Yes F01.50 Vascular dementia without behavioral disturbance 06/26/2020 No Yes I25.10 Atherosclerotic heart disease of native coronary artery without angina 06/26/2020 No Yes pectoris Inactive Problems Resolved Problems Electronic Signature(s) Signed: 10/16/2020 11:09:19 AM By: Worthy Keeler PA-C Entered By: Worthy Keeler on 10/16/2020 11:09:19 [name], [name] ([id number]) -------------------------------------------------------------------------------- Progress Note Details Patient Name: [name], [name] Date of Service: 10/16/2020 11:00 AM Medical Record Number: [id number] Patient Account Number: [id number] Date of Birth/Sex: March 23, 1926 (87 y.o. F) Treating RN: Donnamarie Poag Primary Care Provider: [name] Other Clinician: Referring Provider: [name] Treating Provider/Extender: Skipper Cliche in Treatment: 16 Subjective Chief Complaint Information obtained from Patient Left Heel Pressure Ulcer History of Present Illness (HPI) 87 year old patient was recently seen by the PCPs office for significant pain right great toe which has been going on since July. She was initially treated with Keflex which she did not complete and after the office visit this time she has been put on doxycycline. at the time of her visit she was found to have a ulcer on the plantar surface of the right great toe and also had a pyogenic granuloma over this area. X-ray of the right foot done 12/29/2014 -- IMPRESSION:Soft-tissue swelling and ulceration right great toe. No underlying bony  lytic lesion identified. If osteomyelitis remains of clinical concern MRI can be obtained. Past medical history significant for anemia, chronic kidney disease stage III, obesity, varicose veins, coronary artery disease, gout, history of nicotine addiction given up smoking in 2002, hypertension, status post cardiac catheterization, status post abdominal hysterectomy, cholecystectomy and tonsillectomy. hemoglobin A1c done in August  was 5.8 01/22/2015 -- at this stage the [facility] Walking boat was going to cost them significant amount of money and they want to defer using that at the present time. 01/29/2015 -- she had a podiatry appointment and they have trimmed her toenails. She has not heard back from the vascular office regarding her venous duplex study and I have asked them to call personally so that they can get the appointment soon. 02/06/2015 -- they have made contact with the vascular office and from what I understand that test has been done but the report is pending. 02/19/2014 -- the vascular test is scheduled for tomorrow Readmission: 06/26/2020 upon evaluation today patient appears for initial evaluation in her clinic that she has been here before in 2016 and has been quite sometime. She did have a fractured hip in February on the 23rd 2022. Subsequently this had to be pinned and she ended up with a pressure injury on her heel following when she was using her foot to help move her around in the bed. Subsequently this has led to the wound that she has been dealing with since that time. She lives at home with her daughter currently she does have dementia. 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 07/03/2020 upon evaluation today patient appears to be doing well with regard to her heel ulcer. She did have arterial studies they appear to be doing excellent it was  premature normal across the board with TBI's in the 90s and ABIs well within normal range. Nonetheless there does not appear to be any signs of arterial insufficiency whatsoever. With that being said the patient does have also signs of improvement there is some necrotic tissue in the base of the wound number to try to clear some of that away today I do believe the Iodoflex/Iodosorb is doing well. 5/24; difficult punched out wound on the left medial heel. She has been using Iodoflex 07/17/2020 upon evaluation today patient appears to be doing well at this point in regard to her wound. I do feel like this is a little bit deeper but again that is what is expected as we continue with the Iodoflex I think that this is just going to get deeper until we get to the base of the wound. With that being said I think we are getting much closer to the base of the wound where we can have healthier tissue that we get a be managing here which that will be awesome. In the meantime I am not surprised by what I am seeing and in fact the wound appears to be better as compared to previous findings. 07/24/2020 upon evaluation today patient appears to be doing well with regard to her wound. Overall I am extremely pleased with where things stand today. I do not see any signs of active infection which is great and overall I think that the patient is making good progress. There is good to be some need for sharp debridement today. 07/31/2020 upon evaluation today patient appears to be doing better in regard to her heel ulcer. She has been tolerating the dressing changes without complication. Fortunately there does not appear to be any signs of active infection which is great and overall very pleased in this regard. No fevers, chills, nausea, vomiting, or diarrhea. 08/14/2020 upon evaluation today patient appears to be doing better in regard to her heel ulcer. I am very  pleased in that regard. Unfortunately she still has a slight  deep tissue injury in regard to the medial portion of her foot over the same area. Unfortunately I think this is something that if were not careful it was can open up into the wound. That is my main concern here based on what I see. Fortunately there does not appear to be any evidence of active infection which is great news and overall very pleased with where things stand at this point. 08/21/2020 upon evaluation today patient appears to be doing well with regard to her wound. She has been tolerating the dressing changes without complication. Fortunately there does not appear to be any signs of active infection at this time. No fevers, chills, nausea, vomiting, or diarrhea. I do believe the compression wrap was beneficial for her. 08/28/2020 upon evaluation today patient appears to be doing well with regard to her wounds currently. Fortunately there does not appear to be any signs of active infection at this time. No fevers, chills, nausea, vomiting, or diarrhea. With that being said I think that her leg is doing quite well to be honest. Scrivner, Lachina L. ([id number]) 09/04/2020 upon evaluation today patient appears to be doing well with regard to her wound on the heel. This is showing signs of good epithelial growth although there is probably can it be an indention where this heals that is okay as long as we get it closed. Fortunately I do not see any evidence of infection at this point. 09/13/2020 upon evaluation today patient appears to be doing well with regard to her wounds. She has been tolerating the dressing changes without complication. Fortunately he is actually doing extremely well and I think she is making great progress this is measuring smaller. I would recommend that such that we continue with the [facility] likely since he is doing so well. 09/18/2020 upon evaluation today patient appears to be doing well with regard to her heel ulcer. She is making good progress and I am very pleased  with what we see today. I think the Hydrofera Blue is still doing excellent. 10/02/2020 upon evaluation today patient's wound actually appears to be showing signs of good improvement in regard to the heel. I am very pleased in this regard. With that being said I do believe that the area which is somewhat been stable and dry is starting to lift up and beginning to clear this away as well that is on the foot. Subsequently I am going to have to perform some debridement here and we did actually go ahead and allow this as a wound today as before it was just more deep tissue injury and eschar covering now I think it is a little bit more than that to be honest. 10/09/2020 upon evaluation today patient appears to be doing decently well in regard to her wounds. I am actually very pleased with where things stand today. There does not appear to be any signs of active infection which is great news. No fevers, chills, nausea, vomiting, or diarrhea. She is going require some sharp debridement today. 10/16/2020 upon evaluation today patient appears to be doing decently well in regard to her heel as well as the foot. Both are showing signs of good improvement which is great news. In general and extremely pleased with where things stand at this point. She is tolerating the [facility] well although this is getting so small in the heel I think collagen may be a better option here based on  what I am seeing. With regard to the foot the Iodoflex/Iodosorb is still probably the best method here. Objective Constitutional Well-nourished and well-hydrated in no acute distress. Vitals Time Taken: 11:06 AM, Height: 58 in, Weight: 137 lbs, BMI: 28.6, Temperature: 98.2 F, Pulse: 66 bpm, Respiratory Rate: 16 breaths/min, Blood Pressure: 148/69 mmHg. Respiratory normal breathing without difficulty. Psychiatric this patient is able to make decisions and demonstrates good insight into disease process. Alert and Oriented x 3.  pleasant and cooperative. General Notes: Upon inspection patient's wounds again showed signs of good granulation epithelization at this point. Fortunately there does not appear to be any signs of active infection which is great news and overall very pleased with where things stand. Sharp debridement was performed to clear away some of the necrotic debris in regard to the wounds at both locations as well as some of the callus and she tolerated the debridement today without complication. Integumentary (Hair, Skin) Wound #2 status is Open. Original cause of wound was Pressure Injury. The date acquired was: 04/11/2020. The wound has been in treatment 16 weeks. The wound is located on the Left,Medial Calcaneus. The wound measures 0.2cm length x 0.2cm width x 0.2cm depth; 0.031cm^2 area and 0.006cm^3 volume. There is Fat Layer (Subcutaneous Tissue) exposed. There is no tunneling or undermining noted. There is a medium amount of serosanguineous drainage noted. There is large (67-100%) pink, pale granulation within the wound bed. There is a small (1-33%) amount of necrotic tissue within the wound bed including Adherent Slough. Wound #3 status is Open. Original cause of wound was Pressure Injury. The date acquired was: 10/02/2020. The wound has been in treatment 2 weeks. The wound is located on the Left Foot. The wound measures 0.4cm length x 0.5cm width x 0.4cm depth; 0.157cm^2 area and 0.063cm^3 volume. There is Fat Layer (Subcutaneous Tissue) exposed. There is no tunneling or undermining noted. There is a medium amount of serosanguineous drainage noted. The wound margin is thickened. There is small (1-33%) red, pink granulation within the wound bed. There is a large (67-100%) amount of necrotic tissue within the wound bed including Adherent Slough. Assessment Active Problems ICD-10 Pressure ulcer of left heel, stage 3 Taylor, Sadeen L. ([id number]) Pressure ulcer of other site, stage 3 Essential  (primary) hypertension Vascular dementia without behavioral disturbance Atherosclerotic heart disease of native coronary artery without angina pectoris Procedures Wound #2 Pre-procedure diagnosis of Wound #2 is a Pressure Ulcer located on the Left,Medial Calcaneus . There was a Excisional Skin/Subcutaneous Tissue Debridement with a total area of 0.08 sq cm performed by Tommie Sams., PA-C. With the following instrument(s): Curette to remove Viable and Non-Viable tissue/material. Material removed includes Callus, Subcutaneous Tissue, Slough, and Biofilm after achieving pain control using Lidocaine. A time out was conducted at 11:27, prior to the start of the procedure. A Minimum amount of bleeding was controlled with Pressure. The procedure was tolerated well. Post Debridement Measurements: 0.2cm length x 0.2cm width x 0.2cm depth; 0.006cm^3 volume. Post debridement Stage noted as Category/Stage III. Character of Wound/Ulcer Post Debridement is improved. Post procedure Diagnosis Wound #2: Same as Pre-Procedure Wound #3 Pre-procedure diagnosis of Wound #3 is a Pressure Ulcer located on the Left Foot . There was a Excisional Skin/Subcutaneous Tissue Debridement with a total area of 0.2 sq cm performed by Tommie Sams., PA-C. With the following instrument(s): Curette to remove Viable and Non-Viable tissue/material. Material removed includes Callus, Subcutaneous Tissue, Slough, and Biofilm after achieving pain control using Lidocaine. A time out  was conducted at 11:27, prior to the start of the procedure. A Minimum amount of bleeding was controlled with Pressure. The procedure was tolerated well. Post Debridement Measurements: 0.4cm length x 0.5cm width x 0.4cm depth; 0.063cm^3 volume. Post debridement Stage noted as Category/Stage III. Character of Wound/Ulcer Post Debridement is improved. Post procedure Diagnosis Wound #3: Same as Pre-Procedure Plan Follow-up Appointments: Return Appointment  in 1 week. Nurse Visit as needed Bathing/ Shower/ Hygiene: May shower with wound dressing protected with water repellent cover or cast protector. No tub bath. Edema Control - Lymphedema / Segmental Compressive Device / Other: Patient to wear own compression stockings. Remove compression stockings every night before going to bed and put on every morning when getting up. - right leg Elevate legs to the level of the heart and pump ankles as often as possible Elevate leg(s) parallel to the floor when sitting. DO YOUR BEST to sleep in the bed at night. DO NOT sleep in your recliner. Long hours of sitting in a recliner leads to swelling of the legs and/or potential wounds on your backside. Off-Loading: Other: - Offloading boots while in bed; Try pillow under sheet to keep it in place. WOUND #2: - Calcaneus Wound Laterality: Left, Medial Cleanser: Soap and Water 1 x Per Week/30 Days Discharge Instructions: Gently cleanse wound with antibacterial soap, rinse and pat dry prior to dressing wounds Primary Dressing: Prisma 4.34 (in) 1 x Per Week/30 Days Discharge Instructions: Moisten w/normal saline or sterile water; Cover wound as directed. Do not remove from wound bed. Secondary Dressing: ABD Pad 5x9 (in/in) 1 x Per Week/30 Days Discharge Instructions: Cover with ABD pad. Compression Wrap: Profore Lite LF 3 Multilayer Compression Bandaging System 1 x Per Week/30 Days Discharge Instructions: Apply 3 multi-layer wrap as prescribed. WOUND #3: - Foot Wound Laterality: Left Cleanser: Soap and Water 1 x Per Week/30 Days Discharge Instructions: Gently cleanse wound with antibacterial soap, rinse and pat dry prior to dressing wounds Primary Dressing: Iodosorb 40 (g) 1 x Per Week/30 Days Discharge Instructions: Apply IodoSorb to wound bed only as directed. Secondary Dressing: ABD Pad 5x9 (in/in) 1 x Per Week/30 Days Discharge Instructions: Cover with ABD pad Compression Wrap: Profore Lite LF 3  Multilayer Compression Bandaging System 1 x Per Week/30 Days Discharge Instructions: Apply 3 multi-layer wrap as prescribed. Shaffer, Idona L. ([id number]) 1. Would recommend currently that we going continue with the wound care measures as before and the patient is in agreement the plan. This includes the use of the silver collagen dressing which I think is doing a great job. 2. I am going to recommend that we continue with an ABD pad to cover followed by 3 layer compression wrap. 3. I am going to recommend the patient should continue to be monitored for any signs of infection. Obviously I will see anything right now but this is something we will keep a close eye on. We will see patient back for reevaluation in 1 week here in the clinic. If anything worsens or changes patient will contact our office for additional recommendations. Electronic Signature(s) Signed: 10/16/2020 12:49:19 PM By: Worthy Keeler PA-C Entered By: Worthy Keeler on 10/16/2020 12:49:19 Zwilling, Gerrianne L. ([id number]) -------------------------------------------------------------------------------- SuperBill Details Patient Name: Marken, Dasiah L. Date of Service: 10/16/2020 Medical Record Number: [id number] Patient Account Number: [id number] Date of Birth/Sex: Feb 19, 1926 (87 y.o. F) Treating RN: Donnamarie Poag Primary Care Provider: [name] Other Clinician: Referring Provider: [name] Treating Provider/Extender: Skipper Cliche in Treatment: 16 Diagnosis Coding  ICD-10 Codes Code Description [phone] Pressure ulcer of left heel, stage 3 L89.893 Pressure ulcer of other site, stage 3 I10 Essential (primary) hypertension F01.50 Vascular dementia without behavioral disturbance I25.10 Atherosclerotic heart disease of native coronary artery without angina pectoris Facility Procedures CPT4 Code: [id number] Description: [id number] - DEB SUBQ TISSUE 20 SQ CM/< Modifier: Quantity: 1 CPT4 Code: Description: ICD-10  Diagnosis Description L89.623 Pressure ulcer of left heel, stage 3 L89.893 Pressure ulcer of other site, stage 3 Modifier: Quantity: Physician Procedures CPT4 Code: [id number] Description: 11042 - WC PHYS SUBQ TISS 20 SQ CM Modifier: Quantity: 1 CPT4 Code: Description: ICD-10 Diagnosis Description L89.623 Pressure ulcer of left heel, stage 3 L89.893 Pressure ulcer of other site, stage 3 Modifier: Quantity: Electronic Signature(s) Signed: 10/16/2020 12:49:39 PM By: Worthy Keeler PA-C Previous Signature: 10/16/2020 12:49:29 PM Version By: Worthy Keeler PA-C Entered By: Worthy Keeler on 10/16/2020 12:49:39

## 2020-10-26 ENCOUNTER — Encounter: Payer: PPO | Attending: Physician Assistant | Admitting: Physician Assistant

## 2020-10-26 ENCOUNTER — Other Ambulatory Visit: Payer: Self-pay

## 2020-10-26 DIAGNOSIS — Z87891 Personal history of nicotine dependence: Secondary | ICD-10-CM | POA: Insufficient documentation

## 2020-10-26 DIAGNOSIS — I2510 Atherosclerotic heart disease of native coronary artery without angina pectoris: Secondary | ICD-10-CM | POA: Diagnosis not present

## 2020-10-26 DIAGNOSIS — Z6828 Body mass index (BMI) 28.0-28.9, adult: Secondary | ICD-10-CM | POA: Insufficient documentation

## 2020-10-26 DIAGNOSIS — L89893 Pressure ulcer of other site, stage 3: Secondary | ICD-10-CM | POA: Insufficient documentation

## 2020-10-26 DIAGNOSIS — F0150 Vascular dementia without behavioral disturbance: Secondary | ICD-10-CM | POA: Diagnosis not present

## 2020-10-26 DIAGNOSIS — E669 Obesity, unspecified: Secondary | ICD-10-CM | POA: Diagnosis not present

## 2020-10-26 DIAGNOSIS — I10 Essential (primary) hypertension: Secondary | ICD-10-CM | POA: Insufficient documentation

## 2020-10-26 DIAGNOSIS — L89623 Pressure ulcer of left heel, stage 3: Secondary | ICD-10-CM | POA: Diagnosis not present

## 2020-10-26 DIAGNOSIS — L89629 Pressure ulcer of left heel, unspecified stage: Secondary | ICD-10-CM | POA: Diagnosis present

## 2020-10-26 NOTE — Progress Notes (Addendum)
[name] [name], [name] ([id number]) Visit Report for 10/26/2020 Chief Complaint Document Details Patient Name: [name], [name] Date of Service: 10/26/2020 9:30 AM Medical Record Number: [id number] Patient Account Number: [id number] Date of Birth/Sex: October 26, 1926 (87 y.o. F) Treating RN: Donnamarie Poag Primary Care Provider: [name] Other Clinician: Referring Provider: [name] Treating Provider/Extender: Skipper Cliche in Treatment: 17 Information Obtained from: Patient Chief Complaint Left Heel Pressure Ulcer Electronic Signature(s) Signed: 10/26/2020 9:49:41 AM By: Worthy Keeler PA-C Entered By: Worthy Keeler on 10/26/2020 09:49:41 [name] [name], [name] ([id number]) -------------------------------------------------------------------------------- Debridement Details Patient Name: [name], [name] Date of Service: 10/26/2020 9:30 AM Medical Record Number: [id number] Patient Account Number: [id number] Date of Birth/Sex: 1926/06/09 (87 y.o. F) Treating RN: Donnamarie Poag Primary Care Provider: [name] Other Clinician: Referring Provider: [name] Treating Provider/Extender: Skipper Cliche in Treatment: 17 Debridement Performed for Wound #3 Left Foot Assessment: Performed By: Physician Tommie Sams., PA-C Debridement Type: Debridement Level of Consciousness (Pre- Awake and Alert procedure): Pre-procedure Verification/Time Out Yes - 10:00 Taken: Start Time: 10:02 Pain Control: Lidocaine Total Area Debrided (L x W): 0.5 (cm) x 0.6 (cm) = 0.3 (cm) Tissue and other material Viable, Non-Viable, Callus, Slough, Subcutaneous, Biofilm, Slough debrided: Level: Skin/Subcutaneous Tissue Debridement Description: Excisional Instrument: Curette Bleeding: Minimum Hemostasis Achieved: Pressure Response to Treatment: Procedure was tolerated well Level of Consciousness (Post- Awake and Alert procedure): Post Debridement Measurements of Total Wound Length: (cm)  0.5 Stage: Category/Stage III Width: (cm) 0.5 Depth: (cm) 0.4 Volume: (cm) 0.079 Character of Wound/Ulcer Post Debridement: Improved Post Procedure Diagnosis Same as Pre-procedure Electronic Signature(s) Signed: 10/26/2020 10:20:49 AM By: Donnamarie Poag Signed: 10/26/2020 11:43:44 AM By: Worthy Keeler PA-C Entered By: Donnamarie Poag on 10/26/2020 10:04:41 [name] [name], [name] ([id number]) -------------------------------------------------------------------------------- HPI Details Patient Name: [name] [name], [name] Date of Service: 10/26/2020 9:30 AM Medical Record Number: [id number] Patient Account Number: [id number] Date of Birth/Sex: [birth year] (87 y.o. F) Treating RN: Donnamarie Poag Primary Care Provider: [name] Other Clinician: Referring Provider: [name] Treating Provider/Extender: Skipper Cliche in Treatment: 30 History of Present Illness HPI Description: 87 year old patient was recently seen by the PCPs office for significant pain right great toe which has been going on since July. She was initially treated with Keflex which she did not complete and after the office visit this time she has been put on doxycycline. at the time of her visit she was found to have a ulcer on the plantar surface of the right great toe and also had a pyogenic granuloma over this area. X-ray of the right foot done 12/29/2014 -- IMPRESSION:Soft-tissue swelling and ulceration right great toe. No underlying bony lytic lesion identified. If osteomyelitis remains of clinical concern MRI can be obtained. Past medical history significant for anemia, chronic kidney disease stage III, obesity, varicose veins, coronary artery disease, gout, history of nicotine addiction given up smoking in 2002, hypertension, status post cardiac catheterization, status post abdominal hysterectomy, cholecystectomy and tonsillectomy. hemoglobin A1c done in August was 5.8 01/22/2015 -- at this stage the [facility] Walking boat was going  to cost them significant amount of money and they want to defer using that at the present time. 01/29/2015 -- she had a podiatry appointment and they have trimmed her toenails. She has not heard back from the vascular office regarding her venous duplex study and I have asked them to call personally so that they can get the appointment soon. 02/06/2015 -- they have made contact with the vascular office and from what  I understand that test has been done but the report is pending. 02/19/2014 -- the vascular test is scheduled for tomorrow Readmission: 06/26/2020 upon evaluation today patient appears for initial evaluation in her clinic that she has been here before in 2016 and has been quite sometime. She did have a fractured hip in February on the 23rd 2022. Subsequently this had to be pinned and she ended up with a pressure injury on her heel following when she was using her foot to help move her around in the bed. Subsequently this has led to the wound that she has been dealing with since that time. She lives at home with her daughter currently she does have dementia. 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 07/03/2020 upon evaluation today patient appears to be doing well with regard to her heel ulcer. She did have arterial studies they appear to be doing excellent it was premature normal across the board with TBI's in the 90s and ABIs well within normal range. Nonetheless there does not appear to be any signs of arterial insufficiency whatsoever. With that being said the patient does have also signs of improvement there is some necrotic tissue in the base of the wound number to try to clear some of that away today I do believe the Iodoflex/Iodosorb is doing well. 5/24; difficult punched out wound on the left medial heel. She has been using Iodoflex 07/17/2020 upon  evaluation today patient appears to be doing well at this point in regard to her wound. I do feel like this is a little bit deeper but again that is what is expected as we continue with the Iodoflex I think that this is just going to get deeper until we get to the base of the wound. With that being said I think we are getting much closer to the base of the wound where we can have healthier tissue that we get a be managing here which that will be awesome. In the meantime I am not surprised by what I am seeing and in fact the wound appears to be better as compared to previous findings. 07/24/2020 upon evaluation today patient appears to be doing well with regard to her wound. Overall I am extremely pleased with where things stand today. I do not see any signs of active infection which is great and overall I think that the patient is making good progress. There is good to be some need for sharp debridement today. 07/31/2020 upon evaluation today patient appears to be doing better in regard to her heel ulcer. She has been tolerating the dressing changes without complication. Fortunately there does not appear to be any signs of active infection which is great and overall very pleased in this regard. No fevers, chills, nausea, vomiting, or diarrhea. 08/14/2020 upon evaluation today patient appears to be doing better in regard to her heel ulcer. I am very pleased in that regard. Unfortunately she still has a slight deep tissue injury in regard to the medial portion of her foot over the same area. Unfortunately I think this is something that if were not careful it was can open up into the wound. That is my main concern here based on what I see. Fortunately there does not appear to be any evidence of active infection which is great news and overall very pleased with where things stand at this point. 08/21/2020 upon evaluation today  patient appears to be doing well with regard to her wound. She has been tolerating  the dressing changes without complication. Fortunately there does not appear to be any signs of active infection at this time. No fevers, chills, nausea, vomiting, or diarrhea. I do believe the compression wrap was beneficial for her. 08/28/2020 upon evaluation today patient appears to be doing well with regard to her wounds currently. Fortunately there does not appear to be any signs of active infection at this time. No fevers, chills, nausea, vomiting, or diarrhea. With that being said I think that her leg is doing quite well to be honest. 09/04/2020 upon evaluation today patient appears to be doing well with regard to her wound on the heel. This is showing signs of good epithelial growth although there is probably can it be an indention where this heals that is okay as long as we get it closed. Fortunately I do not see any evidence of infection at this point. 09/13/2020 upon evaluation today patient appears to be doing well with regard to her wounds. She has been tolerating the dressing changes Hudon, Kaiah L. ([id number]) without complication. Fortunately he is actually doing extremely well and I think she is making great progress this is measuring smaller. I would recommend that such that we continue with the [facility] likely since he is doing so well. 09/18/2020 upon evaluation today patient appears to be doing well with regard to her heel ulcer. She is making good progress and I am very pleased with what we see today. I think the Hydrofera Blue is still doing excellent. 10/02/2020 upon evaluation today patient's wound actually appears to be showing signs of good improvement in regard to the heel. I am very pleased in this regard. With that being said I do believe that the area which is somewhat been stable and dry is starting to lift up and beginning to clear this away as well that is on the foot. Subsequently I am going to have to perform some debridement here and we did actually go ahead  and allow this as a wound today as before it was just more deep tissue injury and eschar covering now I think it is a little bit more than that to be honest. 10/09/2020 upon evaluation today patient appears to be doing decently well in regard to her wounds. I am actually very pleased with where things stand today. There does not appear to be any signs of active infection which is great news. No fevers, chills, nausea, vomiting, or diarrhea. She is going require some sharp debridement today. 10/16/2020 upon evaluation today patient appears to be doing decently well in regard to her heel as well as the foot. Both are showing signs of good improvement which is great news. In general and extremely pleased with where things stand at this point. She is tolerating the [facility] well although this is getting so small in the heel I think collagen may be a better option here based on what I am seeing. With regard to the foot the Iodoflex/Iodosorb is still probably the best method here. 10/26/2020 upon evaluation today patient actually appears to be doing well in some respects today. She has been tolerating the dressing changes without complication and this is great news. The Hydrofera Blue is done well for the heel this actually appears to be healed today. With that being said in regard to the foot I think we are ready to switch to [facility] based on what I see  at this point. Electronic Signature(s) Signed: 10/26/2020 11:11:50 AM By: Worthy Keeler PA-C Entered By: Worthy Keeler on 10/26/2020 11:11:49 [name] [name], [name] ([id number]) -------------------------------------------------------------------------------- Physical Exam Details Patient Name: [name] [name], [name] Date of Service: 10/26/2020 9:30 AM Medical Record Number: [id number] Patient Account Number: [id number] Date of Birth/Sex: 1926-10-04 (87 y.o. F) Treating RN: Donnamarie Poag Primary Care Provider: [name] Other Clinician: Referring  Provider: [name] Treating Provider/Extender: Skipper Cliche in Treatment: 52 Constitutional Well-nourished and well-hydrated in no acute distress. Respiratory normal breathing without difficulty. Psychiatric this patient is able to make decisions and demonstrates good insight into disease process. Alert and Oriented x 3. pleasant and cooperative. Notes Upon inspection patient's wound bed did require sharp debridement in regard to the foot I was able to clear this away and I think were ready for [facility] here. In regard to the heel this actually is healed quite nicely and the [facility] is done all some for this region it is closed. Electronic Signature(s) Signed: 10/26/2020 11:12:06 AM By: Worthy Keeler PA-C Entered By: Worthy Keeler on 10/26/2020 11:12:06 Manwarren, Ashlan L. ([id number]) -------------------------------------------------------------------------------- Physician Orders Details Patient Name: [name] [name], [name] Date of Service: 10/26/2020 9:30 AM Medical Record Number: [id number] Patient Account Number: [id number] Date of Birth/Sex: [birth year] (87 y.o. F) Treating RN: Donnamarie Poag Primary Care Provider: [name] Other Clinician: Referring Provider: [name] Treating Provider/Extender: Skipper Cliche in Treatment: 17 Verbal / Phone Orders: No Diagnosis Coding ICD-10 Coding Code Description [phone] Pressure ulcer of left heel, stage 3 L89.893 Pressure ulcer of other site, stage 3 I10 Essential (primary) hypertension F01.50 Vascular dementia without behavioral disturbance I25.10 Atherosclerotic heart disease of native coronary artery without angina pectoris Follow-up Appointments o Return Appointment in 1 week. o Nurse Visit as needed Bathing/ Shower/ Hygiene o May shower with wound dressing protected with water repellent cover or cast protector. o No tub bath. Edema Control - Lymphedema / Segmental Compressive Device /  Other o Patient to wear own compression stockings. Remove compression stockings every night before going to bed and put on every morning when getting up. - right leg o Elevate legs to the level of the heart and pump ankles as often as possible o Elevate leg(s) parallel to the floor when sitting. o DO YOUR BEST to sleep in the bed at night. DO NOT sleep in your recliner. Long hours of sitting in a recliner leads to swelling of the legs and/or potential wounds on your backside. Off-Loading o Other: - Offloading boots while in bed; Try pillow under sheet to keep it in place. Wound Treatment Wound #3 - Foot Wound Laterality: Left Cleanser: Soap and Water 1 x Per Week/30 Days Discharge Instructions: Gently cleanse wound with antibacterial soap, rinse and pat dry prior to dressing wounds Primary Dressing: Hydrofera Blue Ready Transfer Foam, 2.5x2.5 (in/in) 1 x Per Week/30 Days Discharge Instructions: Apply Hydrofera Blue Ready to wound bed as directed Secondary Dressing: ABD Pad 5x9 (in/in) 1 x Per Week/30 Days Discharge Instructions: Cover with ABD pad Compression Wrap: Profore Lite LF 3 Multilayer Compression Bandaging System 1 x Per Week/30 Days Discharge Instructions: Apply 3 multi-layer wrap as prescribed. Electronic Signature(s) Signed: 10/26/2020 10:20:49 AM By: Donnamarie Poag Signed: 10/26/2020 11:43:44 AM By: Worthy Keeler PA-C Entered By: Donnamarie Poag on 10/26/2020 10:05:07 [name] [name], [name] ([id number]) -------------------------------------------------------------------------------- Problem List Details Patient Name: Newhard, Elektra L. Date of Service: 10/26/2020 9:30 AM Medical Record Number: [id number] Patient Account Number: [id number] Date of  Birth/Sex: 08/19/1926 (87 y.o. F) Treating RN: Donnamarie Poag Primary Care Provider: [name] Other Clinician: Referring Provider: [name] Treating Provider/Extender: Skipper Cliche in Treatment: 17 Active  Problems ICD-10 Encounter Code Description Active Date MDM Diagnosis [phone] Pressure ulcer of left heel, stage 3 06/26/2020 No Yes L89.893 Pressure ulcer of other site, stage 3 10/02/2020 No Yes I10 Essential (primary) hypertension 06/26/2020 No Yes F01.50 Vascular dementia without behavioral disturbance 06/26/2020 No Yes I25.10 Atherosclerotic heart disease of native coronary artery without angina 06/26/2020 No Yes pectoris Inactive Problems Resolved Problems Electronic Signature(s) Signed: 10/26/2020 9:49:36 AM By: Worthy Keeler PA-C Entered By: Worthy Keeler on 10/26/2020 09:49:36 [name] [name], [name] ([id number]) -------------------------------------------------------------------------------- Progress Note Details Patient Name: [name] [name], [name] Date of Service: 10/26/2020 9:30 AM Medical Record Number: [id number] Patient Account Number: [id number] Date of Birth/Sex: 01/17/1927 (87 y.o. F) Treating RN: Donnamarie Poag Primary Care Provider: [name] Other Clinician: Referring Provider: [name] Treating Provider/Extender: Skipper Cliche in Treatment: 17 Subjective Chief Complaint Information obtained from Patient Left Heel Pressure Ulcer History of Present Illness (HPI) 87 year old patient was recently seen by the PCPs office for significant pain right great toe which has been going on since July. She was initially treated with Keflex which she did not complete and after the office visit this time she has been put on doxycycline. at the time of her visit she was found to have a ulcer on the plantar surface of the right great toe and also had a pyogenic granuloma over this area. X-ray of the right foot done 12/29/2014 -- IMPRESSION:Soft-tissue swelling and ulceration right great toe. No underlying bony lytic lesion identified. If osteomyelitis remains of clinical concern MRI can be obtained. Past medical history significant for anemia, chronic kidney disease stage III,  obesity, varicose veins, coronary artery disease, gout, history of nicotine addiction given up smoking in 2002, hypertension, status post cardiac catheterization, status post abdominal hysterectomy, cholecystectomy and tonsillectomy. hemoglobin A1c done in August was 5.8 01/22/2015 -- at this stage the [facility] Walking boat was going to cost them significant amount of money and they want to defer using that at the present time. 01/29/2015 -- she had a podiatry appointment and they have trimmed her toenails. She has not heard back from the vascular office regarding her venous duplex study and I have asked them to call personally so that they can get the appointment soon. 02/06/2015 -- they have made contact with the vascular office and from what I understand that test has been done but the report is pending. 02/19/2014 -- the vascular test is scheduled for tomorrow Readmission: 06/26/2020 upon evaluation today patient appears for initial evaluation in her clinic that she has been here before in 2016 and has been quite sometime. She did have a fractured hip in February on the 23rd 2022. Subsequently this had to be pinned and she ended up with a pressure injury on her heel following when she was using her foot to help move her around in the bed. Subsequently this has led to the wound that she has been dealing with since that time. She lives at home with her daughter currently she does have dementia. 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 07/03/2020 upon evaluation today patient appears to be doing well with regard to her heel ulcer. She did have arterial studies they appear to be doing excellent it  was premature normal across the board with TBI's in the 90s and ABIs well within normal range. Nonetheless there does not appear to be any signs of arterial insufficiency  whatsoever. With that being said the patient does have also signs of improvement there is some necrotic tissue in the base of the wound number to try to clear some of that away today I do believe the Iodoflex/Iodosorb is doing well. 5/24; difficult punched out wound on the left medial heel. She has been using Iodoflex 07/17/2020 upon evaluation today patient appears to be doing well at this point in regard to her wound. I do feel like this is a little bit deeper but again that is what is expected as we continue with the Iodoflex I think that this is just going to get deeper until we get to the base of the wound. With that being said I think we are getting much closer to the base of the wound where we can have healthier tissue that we get a be managing here which that will be awesome. In the meantime I am not surprised by what I am seeing and in fact the wound appears to be better as compared to previous findings. 07/24/2020 upon evaluation today patient appears to be doing well with regard to her wound. Overall I am extremely pleased with where things stand today. I do not see any signs of active infection which is great and overall I think that the patient is making good progress. There is good to be some need for sharp debridement today. 07/31/2020 upon evaluation today patient appears to be doing better in regard to her heel ulcer. She has been tolerating the dressing changes without complication. Fortunately there does not appear to be any signs of active infection which is great and overall very pleased in this regard. No fevers, chills, nausea, vomiting, or diarrhea. 08/14/2020 upon evaluation today patient appears to be doing better in regard to her heel ulcer. I am very pleased in that regard. Unfortunately she still has a slight deep tissue injury in regard to the medial portion of her foot over the same area. Unfortunately I think this is something that if were not careful it was can open up  into the wound. That is my main concern here based on what I see. Fortunately there does not appear to be any evidence of active infection which is great news and overall very pleased with where things stand at this point. 08/21/2020 upon evaluation today patient appears to be doing well with regard to her wound. She has been tolerating the dressing changes without complication. Fortunately there does not appear to be any signs of active infection at this time. No fevers, chills, nausea, vomiting, or diarrhea. I do believe the compression wrap was beneficial for her. 08/28/2020 upon evaluation today patient appears to be doing well with regard to her wounds currently. Fortunately there does not appear to be any signs of active infection at this time. No fevers, chills, nausea, vomiting, or diarrhea. With that being said I think that her leg is doing quite well to be honest. [name] [name], [name] ([id number]) 09/04/2020 upon evaluation today patient appears to be doing well with regard to her wound on the heel. This is showing signs of good epithelial growth although there is probably can it be an indention where this heals that is okay as long as we get it closed. Fortunately I do not see any evidence of infection at this point. 09/13/2020 upon  evaluation today patient appears to be doing well with regard to her wounds. She has been tolerating the dressing changes without complication. Fortunately he is actually doing extremely well and I think she is making great progress this is measuring smaller. I would recommend that such that we continue with the [facility] likely since he is doing so well. 09/18/2020 upon evaluation today patient appears to be doing well with regard to her heel ulcer. She is making good progress and I am very pleased with what we see today. I think the Hydrofera Blue is still doing excellent. 10/02/2020 upon evaluation today patient's wound actually appears to be showing signs of  good improvement in regard to the heel. I am very pleased in this regard. With that being said I do believe that the area which is somewhat been stable and dry is starting to lift up and beginning to clear this away as well that is on the foot. Subsequently I am going to have to perform some debridement here and we did actually go ahead and allow this as a wound today as before it was just more deep tissue injury and eschar covering now I think it is a little bit more than that to be honest. 10/09/2020 upon evaluation today patient appears to be doing decently well in regard to her wounds. I am actually very pleased with where things stand today. There does not appear to be any signs of active infection which is great news. No fevers, chills, nausea, vomiting, or diarrhea. She is going require some sharp debridement today. 10/16/2020 upon evaluation today patient appears to be doing decently well in regard to her heel as well as the foot. Both are showing signs of good improvement which is great news. In general and extremely pleased with where things stand at this point. She is tolerating the [facility] well although this is getting so small in the heel I think collagen may be a better option here based on what I am seeing. With regard to the foot the Iodoflex/Iodosorb is still probably the best method here. 10/26/2020 upon evaluation today patient actually appears to be doing well in some respects today. She has been tolerating the dressing changes without complication and this is great news. The Hydrofera Blue is done well for the heel this actually appears to be healed today. With that being said in regard to the foot I think we are ready to switch to [facility] based on what I see at this point. Objective Constitutional Well-nourished and well-hydrated in no acute distress. Vitals Time Taken: 9:45 AM, Height: 58 in, Weight: 137 lbs, BMI: 28.6, Temperature: 98.8 F, Pulse: 92 bpm,  Respiratory Rate: 16 breaths/min, Blood Pressure: 119/74 mmHg. Respiratory normal breathing without difficulty. Psychiatric this patient is able to make decisions and demonstrates good insight into disease process. Alert and Oriented x 3. pleasant and cooperative. General Notes: Upon inspection patient's wound bed did require sharp debridement in regard to the foot I was able to clear this away and I think were ready for [facility] here. In regard to the heel this actually is healed quite nicely and the [facility] is done all some for this region it is closed. Integumentary (Hair, Skin) Wound #2 status is Healed - Epithelialized. Original cause of wound was Pressure Injury. The date acquired was: 04/11/2020. The wound has been in treatment 17 weeks. The wound is located on the Left,Medial Calcaneus. The wound measures 0cm length x 0cm width x 0cm  depth; 0cm^2 area and 0cm^3 volume. There is no tunneling or undermining noted. There is a none present amount of drainage noted. There is no granulation within the wound bed. There is no necrotic tissue within the wound bed. Wound #3 status is Open. Original cause of wound was Pressure Injury. The date acquired was: 10/02/2020. The wound has been in treatment 3 weeks. The wound is located on the Left Foot. The wound measures 0.4cm length x 0.5cm width x 0.4cm depth; 0.157cm^2 area and 0.063cm^3 volume. There is Fat Layer (Subcutaneous Tissue) exposed. There is no tunneling noted, however, there is undermining starting at 12:00 and ending at 12:00 with a maximum distance of 0.4cm. There is a medium amount of serosanguineous drainage noted. The wound margin is thickened. There is small (1-33%) red, pink granulation within the wound bed. There is a large (67-100%) amount of necrotic tissue within the wound bed including Adherent Slough. Assessment [name] [name], [name] ([id number]) Active Problems ICD-10 Pressure ulcer of left heel, stage 3 Pressure  ulcer of other site, stage 3 Essential (primary) hypertension Vascular dementia without behavioral disturbance Atherosclerotic heart disease of native coronary artery without angina pectoris Procedures Wound #3 Pre-procedure diagnosis of Wound #3 is a Pressure Ulcer located on the Left Foot . There was a Excisional Skin/Subcutaneous Tissue Debridement with a total area of 0.3 sq cm performed by Tommie Sams., PA-C. With the following instrument(s): Curette to remove Viable and Non-Viable tissue/material. Material removed includes Callus, Subcutaneous Tissue, Slough, and Biofilm after achieving pain control using Lidocaine. A time out was conducted at 10:00, prior to the start of the procedure. A Minimum amount of bleeding was controlled with Pressure. The procedure was tolerated well. Post Debridement Measurements: 0.5cm length x 0.5cm width x 0.4cm depth; 0.079cm^3 volume. Post debridement Stage noted as Category/Stage III. Character of Wound/Ulcer Post Debridement is improved. Post procedure Diagnosis Wound #3: Same as Pre-Procedure Wound #2 Pre-procedure diagnosis of Wound #2 is a Pressure Ulcer located on the Left,Medial Calcaneus . There was a Three Layer Compression Therapy Procedure by Donnamarie Poag, RN. Post procedure Diagnosis Wound #2: Same as Pre-Procedure Plan Follow-up Appointments: Return Appointment in 1 week. Nurse Visit as needed Bathing/ Shower/ Hygiene: May shower with wound dressing protected with water repellent cover or cast protector. No tub bath. Edema Control - Lymphedema / Segmental Compressive Device / Other: Patient to wear own compression stockings. Remove compression stockings every night before going to bed and put on every morning when getting up. - right leg Elevate legs to the level of the heart and pump ankles as often as possible Elevate leg(s) parallel to the floor when sitting. DO YOUR BEST to sleep in the bed at night. DO NOT sleep in your recliner.  Long hours of sitting in a recliner leads to swelling of the legs and/or potential wounds on your backside. Off-Loading: Other: - Offloading boots while in bed; Try pillow under sheet to keep it in place. WOUND #3: - Foot Wound Laterality: Left Cleanser: Soap and Water 1 x Per Week/30 Days Discharge Instructions: Gently cleanse wound with antibacterial soap, rinse and pat dry prior to dressing wounds Primary Dressing: Hydrofera Blue Ready Transfer Foam, 2.5x2.5 (in/in) 1 x Per Week/30 Days Discharge Instructions: Apply Hydrofera Blue Ready to wound bed as directed Secondary Dressing: ABD Pad 5x9 (in/in) 1 x Per Week/30 Days Discharge Instructions: Cover with ABD pad Compression Wrap: Profore Lite LF 3 Multilayer Compression Bandaging System 1 x Per Week/30 Days Discharge Instructions: Apply 3  multi-layer wrap as prescribed. 1. Would recommend currently that we going continue with the wound care measures as before and the patient is in agreement the plan. We will use Hydrofera Blue for the foot and the heel again is completely healed. 2. I am also can recommend we continue with 3 layer compression wrap. 3. I am also can recommend that the patient continue to monitor for any signs of worsening if she has any pain she should let her daughter know we can pass that along to this but overall she seems to really be doing quite well. We will see patient back for reevaluation in 1 week here in the clinic. If anything worsens or changes patient will contact our office for additional recommendations. [name] [name], [name] ([id number]) Electronic Signature(s) Signed: 10/26/2020 11:17:53 AM By: Worthy Keeler PA-C Entered By: Worthy Keeler on 10/26/2020 11:17:53 Neal, Veryl L. ([id number]) -------------------------------------------------------------------------------- SuperBill Details Patient Name: Mudrick, Atavia L. Date of Service: 10/26/2020 Medical Record Number: [id number] Patient Account  Number: [id number] Date of Birth/Sex: Aug 13, 1926 (87 y.o. F) Treating RN: Donnamarie Poag Primary Care Provider: [name] Other Clinician: Referring Provider: [name] Treating Provider/Extender: Skipper Cliche in Treatment: 17 Diagnosis Coding ICD-10 Codes Code Description [id number] Pressure ulcer of left heel, stage 3 L89.893 Pressure ulcer of other site, stage 3 I10 Essential (primary) hypertension F01.50 Vascular dementia without behavioral disturbance I25.10 Atherosclerotic heart disease of native coronary artery without angina pectoris Facility Procedures CPT4 Code: [id number] Description: [id number] - DEB SUBQ TISSUE 20 SQ CM/< Modifier: Quantity: 1 CPT4 Code: Description: ICD-10 Diagnosis Description L89.893 Pressure ulcer of other site, stage 3 Modifier: Quantity: Physician Procedures CPT4 Code: [id number] Description: 11042 - WC PHYS SUBQ TISS 20 SQ CM Modifier: Quantity: 1 CPT4 Code: Description: ICD-10 Diagnosis Description L89.893 Pressure ulcer of other site, stage 3 Modifier: Quantity: Electronic Signature(s) Signed: 10/26/2020 11:18:15 AM By: Worthy Keeler PA-C Previous Signature: 10/26/2020 10:20:49 AM Version By: Donnamarie Poag Entered By: Worthy Keeler on 10/26/2020 11:18:15

## 2020-10-26 NOTE — Progress Notes (Signed)
[name] ([id number]) Visit Report for 10/26/2020 Arrival Information Details Patient Name: [name] [name], [name] Date of Service: 10/26/2020 9:30 AM Medical Record Number: [id number] Patient Account Number: [id number] Date of Birth/Sex: 1926-09-14 (87 y.o. F) Treating RN: Donnamarie Poag Primary Care Keevan Wolz: [name] Other Clinician: Referring Maycel Riffe: [name] Treating Calen Posch/Extender: Skipper Cliche in Treatment: 7 Visit Information History Since Last Visit Added or deleted any medications: No Patient Arrived: Wheel Chair Had a fall or experienced change in No Arrival Time: 09:44 activities of daily living that may affect Accompanied By: daughter risk of falls: Transfer Assistance: EasyPivot Patient Lift Hospitalized since last visit: No Patient Identification Verified: Yes Has Dressing in Place as Prescribed: Yes Secondary Verification Process Completed: Yes Pain Present Now: No Patient Requires Transmission-Based No Precautions: Patient Has Alerts: Yes Patient Alerts: NOT diabetic ABI R1.18 L1.25 06/27/20 Electronic Signature(s) Signed: 10/26/2020 10:20:49 AM By: Donnamarie Poag Entered ByDonnamarie Poag on 10/26/2020 09:44:45 [name] ([id number]) -------------------------------------------------------------------------------- Clinic Level of Care Assessment Details Patient Name: [name] [name], [name] Date of Service: 10/26/2020 9:30 AM Medical Record Number: [id number] Patient Account Number: [id number] Date of Birth/Sex: 25-Oct-1926 (87 y.o. F) Treating RN: Donnamarie Poag Primary Care Unnamed Zeien: [name] Other Clinician: Referring Eliceo Gladu: [name] Treating Emmarose Klinke/Extender: Skipper Cliche in Treatment: 17 Clinic Level of Care Assessment Items TOOL 1 Quantity Score []  - Use when EandM and Procedure is performed on INITIAL visit 0 ASSESSMENTS - Nursing Assessment / Reassessment []  - General Physical Exam (combine w/ comprehensive  assessment (listed just below) when performed on new 0 pt. evals) []  - 0 Comprehensive Assessment (HX, ROS, Risk Assessments, Wounds Hx, etc.) ASSESSMENTS - Wound and Skin Assessment / Reassessment []  - Dermatologic / Skin Assessment (not related to wound area) 0 ASSESSMENTS - Ostomy and/or Continence Assessment and Care []  - Incontinence Assessment and Management 0 []  - 0 Ostomy Care Assessment and Management (repouching, etc.) PROCESS - Coordination of Care []  - Simple Patient / Family Education for ongoing care 0 []  - 0 Complex (extensive) Patient / Family Education for ongoing care []  - 0 Staff obtains Programmer, systems, Records, Test Results / Process Orders []  - 0 Staff telephones HHA, Nursing Homes / Clarify orders / etc []  - 0 Routine Transfer to another Facility (non-emergent condition) []  - 0 Routine Hospital Admission (non-emergent condition) []  - 0 New Admissions / Biomedical engineer / Ordering NPWT, Apligraf, etc. []  - 0 Emergency Hospital Admission (emergent condition) PROCESS - Special Needs []  - Pediatric / Minor Patient Management 0 []  - 0 Isolation Patient Management []  - 0 Hearing / Language / Visual special needs []  - 0 Assessment of Community assistance (transportation, D/C planning, etc.) []  - 0 Additional assistance / Altered mentation []  - 0 Support Surface(s) Assessment (bed, cushion, seat, etc.) INTERVENTIONS - Miscellaneous []  - External ear exam 0 []  - 0 Patient Transfer (multiple staff / Civil Service fast streamer / Similar devices) []  - 0 Simple Staple / Suture removal (25 or less) []  - 0 Complex Staple / Suture removal (26 or more) []  - 0 Hypo/Hyperglycemic Management (do not check if billed separately) []  - 0 Ankle / Brachial Index (ABI) - do not check if billed separately Has the patient been seen at the hospital within the last three years: Yes Total Score: 0 Level Of Care: ____ [name] ([id number]) Electronic Signature(s) Signed:  10/26/2020 10:20:49 AM By: Donnamarie Poag Entered By: Donnamarie Poag on 10/26/2020 10:05:14 [name] ([id number]) -------------------------------------------------------------------------------- Complex / Palliative Patient Assessment  Details Patient Name: [name] [name], [name] Date of Service: 10/26/2020 9:30 AM Medical Record Number: [id number] Patient Account Number: [id number] Date of Birth/Sex: March 07, 1926 (87 y.o. F) Treating RN: Donnamarie Poag Primary Care Sherina Stammer: [name] Other Clinician: Referring Karma Ansley: [name] Treating Stephinie Battisti/Extender: Skipper Cliche in Treatment: 17 Palliative Management Criteria Complex Wound Management Criteria Patient has remarkable or complex co-morbidities requiring medications or treatments that extend wound healing times. Examples: o Diabetes mellitus with chronic renal failure or end stage renal disease requiring dialysis o Advanced or poorly controlled rheumatoid arthritis o Diabetes mellitus and end stage chronic obstructive pulmonary disease o Active cancer with current chemo- or radiation therapy HTN, PVD Care Approach Wound Care Plan: Complex Wound Management Electronic Signature(s) Signed: 10/26/2020 10:20:32 AM By: Donnamarie Poag Signed: 10/26/2020 11:43:44 AM By: Worthy Keeler PA-C Entered By: Donnamarie Poag on 10/26/2020 10:20:32 [name] ([id number]) -------------------------------------------------------------------------------- Compression Therapy Details Patient Name: [name] [name], [name] Date of Service: 10/26/2020 9:30 AM Medical Record Number: [id number] Patient Account Number: [id number] Date of Birth/Sex: [birth year] (87 y.o. F) Treating RN: Donnamarie Poag Primary Care Welda Azzarello: [name] Other Clinician: Referring Jestin Burbach: [name] Treating Azeneth Carbonell/Extender: Skipper Cliche in Treatment: 17 Compression Therapy Performed for Wound Assessment: Wound #2 Left,Medial Calcaneus Performed By:  Junius Argyle, RN Compression Type: Three Layer Post Procedure Diagnosis Same as Pre-procedure Electronic Signature(s) Signed: 10/26/2020 10:20:49 AM By: Donnamarie Poag Entered By: Donnamarie Poag on 10/26/2020 09:58:43 Vanlue, Adysen [name]. ([id number]) -------------------------------------------------------------------------------- Encounter Discharge Information Details Patient Name: [name] [name], [name] Date of Service: 10/26/2020 9:30 AM Medical Record Number: [id number] Patient Account Number: [id number] Date of Birth/Sex: 1926-06-26 (87 y.o. F) Treating RN: Donnamarie Poag Primary Care Ardenia Stiner: [name] Other Clinician: Referring Mena Lienau: [name] Treating Athena Baltz/Extender: Skipper Cliche in Treatment: 17 Encounter Discharge Information Items Post Procedure Vitals Discharge Condition: Stable Temperature (F): 98.8 Ambulatory Status: Wheelchair Pulse (bpm): 92 Discharge Destination: Home Respiratory Rate (breaths/min): 16 Transportation: Private Auto Blood Pressure (mmHg): 119/74 Accompanied By: daughter Schedule Follow-up Appointment: Yes Clinical Summary of Care: Electronic Signature(s) Signed: 10/26/2020 10:20:49 AM By: Donnamarie Poag Entered ByDonnamarie Poag on 10/26/2020 10:13:14 Schlotterbeck, Blessyn [name]. ([id number]) -------------------------------------------------------------------------------- Lower Extremity Assessment Details Patient Name: [name] [name], [name] Date of Service: 10/26/2020 9:30 AM Medical Record Number: [id number] Patient Account Number: [id number] Date of Birth/Sex: 1926-12-19 (87 y.o. F) Treating RN: Donnamarie Poag Primary Care Adriena Manfre: [name] Other Clinician: Referring Sang Blount: [name] Treating Alara Daniel/Extender: Skipper Cliche in Treatment: 17 Edema Assessment Assessed: Shirlyn Goltz: Yes] Patrice Paradise: No] [Left: Edema] [Right: :] Calf Left: Right: Point of Measurement: 32 cm From Medial Instep 32 cm Ankle Left: Right: Point of Measurement:  12 cm From Medial Instep 22 cm Vascular Assessment Pulses: Dorsalis Pedis Palpable: [Left:Yes] Electronic Signature(s) Signed: 10/26/2020 10:20:49 AM By: Donnamarie Poag Entered ByDonnamarie Poag on 10/26/2020 09:56:20 Pettitt, Kielyn [name]. ([id number]) -------------------------------------------------------------------------------- Multi Wound Chart Details Patient Name: [name] [name], [name] Date of Service: 10/26/2020 9:30 AM Medical Record Number: [id number] Patient Account Number: [id number] Date of Birth/Sex: Mar 09, 1926 (87 y.o. F) Treating RN: Donnamarie Poag Primary Care Jaece Ducharme: [name] Other Clinician: Referring Rhona Fusilier: [name] Treating Hana Trippett/Extender: Skipper Cliche in Treatment: 17 Vital Signs Height(in): 56 Pulse(bpm): 9 Weight(lbs): 137 Blood Pressure(mmHg): 119/74 Body Mass Index(BMI): 29 Temperature(F): 98.8 Respiratory Rate(breaths/min): 16 Photos: [N/A:N/A] Wound Location: Left, Medial Calcaneus Left Foot N/A Wounding Event: Pressure Injury Pressure Injury N/A Primary Etiology: Pressure Ulcer Pressure Ulcer N/A Comorbid History: Cataracts, Anemia, Coronary Artery Cataracts, Anemia, Coronary Artery N/A Disease, Hypertension,  Myocardial Disease, Hypertension, Myocardial Infarction, End Stage Renal Infarction, End Stage Renal Disease, Gout, Osteoarthritis, Disease, Gout, Osteoarthritis, Dementia Dementia Date Acquired: 04/11/2020 10/02/2020 N/A Weeks of Treatment: 17 3 N/A Wound Status: Open Open N/A Measurements [name] x W x D (cm) 0.2x0.2x0.1 0.4x0.5x0.4 N/A Area (cm) : 0.031 0.157 N/A Volume (cm) : 0.003 0.063 N/A % Reduction in Area: 97.40% 50.00% N/A % Reduction in Volume: 99.40% -103.20% N/A Starting Position 1 (o'clock): 12 Ending Position 1 (o'clock): 12 Maximum Distance 1 (cm): 0.4 Undermining: No Yes N/A Classification: Category/Stage III Category/Stage III N/A Exudate Amount: None Present Medium N/A Exudate Type: N/A Serosanguineous  N/A Exudate Color: N/A red, brown N/A Wound Margin: N/A Thickened N/A Granulation Amount: None Present (0%) Small (1-33%) N/A Granulation Quality: N/A Red, Pink N/A Necrotic Amount: Large (67-100%) Large (67-100%) N/A Necrotic Tissue: Eschar Adherent Slough N/A Exposed Structures: Fat Layer (Subcutaneous Tissue): Fat Layer (Subcutaneous Tissue): N/A Yes Yes Fascia: No Fascia: No Tendon: No Tendon: No Muscle: No Muscle: No Joint: No Joint: No Bone: No Bone: No Epithelialization: None N/A N/A Treatment Notes Behanna, Devony [name]. ([id number]) Electronic Signature(s) Signed: 10/26/2020 10:20:49 AM By: Donnamarie Poag Entered By: Donnamarie Poag on 10/26/2020 09:57:42 Ramella, Rudene [name]. ([id number]) -------------------------------------------------------------------------------- [place] Details Patient Name: [name] [name], [name] Date of Service: 10/26/2020 9:30 AM Medical Record Number: [id number] Patient Account Number: [id number] Date of Birth/Sex: 1926/04/18 (87 y.o. F) Treating RN: Donnamarie Poag Primary Care Anas Reister: [name] Other Clinician: Referring Sharlie Shreffler: [name] Treating Axten Pascucci/Extender: Skipper Cliche in Treatment: 17 Active Inactive Wound/Skin Impairment Nursing Diagnoses: Impaired tissue integrity Goals: Patient/caregiver will verbalize understanding of skin care regimen Date Initiated: 06/26/2020 Date Inactivated: 08/21/2020 Target Resolution Date: 06/26/2020 Goal Status: Met Ulcer/skin breakdown will have a volume reduction of 30% by week 4 Date Initiated: 06/26/2020 Date Inactivated: 08/21/2020 Target Resolution Date: 07/27/2020 Goal Status: Met Ulcer/skin breakdown will have a volume reduction of 50% by week 8 Date Initiated: 06/26/2020 Date Inactivated: 09/18/2020 Target Resolution Date: 08/26/2020 Goal Status: Unmet Unmet Reason: con't tx Ulcer/skin breakdown will have a volume reduction of 80% by week 12 Date Initiated:  06/26/2020 Target Resolution Date: 09/26/2020 Goal Status: Active Ulcer/skin breakdown will heal within 14 weeks Date Initiated: 06/26/2020 Target Resolution Date: 10/27/2020 Goal Status: Active Interventions: Assess patient/caregiver ability to obtain necessary supplies Assess patient/caregiver ability to perform ulcer/skin care regimen upon admission and as needed Assess ulceration(s) every visit Provide education on ulcer and skin care Treatment Activities: Referred to DME Akeelah Seppala for dressing supplies : 06/26/2020 Skin care regimen initiated : 06/26/2020 Notes: Electronic Signature(s) Signed: 10/26/2020 10:20:49 AM By: Donnamarie Poag Entered ByDonnamarie Poag on 10/26/2020 09:57:23 Formby, Lyrical [name]. ([id number]) -------------------------------------------------------------------------------- Pain Assessment Details Patient Name: [name] [name], [name] Date of Service: 10/26/2020 9:30 AM Medical Record Number: [id number] Patient Account Number: [id number] Date of Birth/Sex: [birth year] (87 y.o. F) Treating RN: Donnamarie Poag Primary Care Harsh Trulock: [name] Other Clinician: Referring Cire Clute: [name] Treating Jakarri Lesko/Extender: Skipper Cliche in Treatment: 17 Active Problems Location of Pain Severity and Description of Pain Patient Has Paino No Site Locations Rate the pain. Current Pain Level: 0 Pain Management and Medication Current Pain Management: Electronic Signature(s) Signed: 10/26/2020 10:20:49 AM By: Donnamarie Poag Entered By: Donnamarie Poag on 10/26/2020 09:47:15 Keown, Nereyda [name]. ([id number]) -------------------------------------------------------------------------------- Patient/Caregiver Education Details Patient Name: [name] [name], [name] Date of Service: 10/26/2020 9:30 AM Medical Record Number: [id number] Patient Account Number: [id number] Date of Birth/Gender: 1926-06-02 (87 y.o. F) Treating RN: Donnamarie Poag Primary Care Physician: Caryn Section,  Elenore Rota Other  Clinician: Referring Physician: [name] Treating Physician/Extender: Skipper Cliche in Treatment: 39 Education Assessment Education Provided To: Patient and Caregiver Education Topics Provided Wound Debridement: Wound/Skin Impairment: Electronic Signature(s) Signed: 10/26/2020 10:20:49 AM By: Donnamarie Poag Entered By: Donnamarie Poag on 10/26/2020 10:05:34 Betten, Inas [name]. ([id number]) -------------------------------------------------------------------------------- Wound Assessment Details Patient Name: [name] [name], [name] Date of Service: 10/26/2020 9:30 AM Medical Record Number: [id number] Patient Account Number: [id number] Date of Birth/Sex: 01-26-1927 (87 y.o. F) Treating RN: Donnamarie Poag Primary Care Laquincy Eastridge: [name] Other Clinician: Referring Ranulfo Kall: [name] Treating Scott Vanderveer/Extender: Skipper Cliche in Treatment: 17 Wound Status Wound Number: 2 Primary Pressure Ulcer Etiology: Wound Location: Left, Medial Calcaneus Wound Healed - Epithelialized Wounding Event: Pressure Injury Status: Date Acquired: 04/11/2020 Comorbid Cataracts, Anemia, Coronary Artery Disease, Weeks Of Treatment: 17 History: Hypertension, Myocardial Infarction, End Stage Renal Clustered Wound: No Disease, Gout, Osteoarthritis, Dementia Photos Wound Measurements Length: (cm) 0 Width: (cm) 0 Depth: (cm) 0 Area: (cm) Volume: (cm) % Reduction in Area: 100% % Reduction in Volume: 100% Epithelialization: None 0 Tunneling: No 0 Undermining: No Wound Description Classification: Category/Stage III Exudate Amount: None Present Foul Odor After Cleansing: No Slough/Fibrino Yes Wound Bed Granulation Amount: None Present (0%) Exposed Structure Necrotic Amount: None Present (0%) Fascia Exposed: No Fat Layer (Subcutaneous Tissue) Exposed: No Tendon Exposed: No Muscle Exposed: No Joint Exposed: No Bone Exposed: No Electronic Signature(s) Signed: 10/26/2020 10:20:49 AM By:  Donnamarie Poag Entered ByDonnamarie Poag on 10/26/2020 10:03:57 Buenger, Michael [name]. ([id number]) -------------------------------------------------------------------------------- Wound Assessment Details Patient Name: [name] [name], [name] Date of Service: 10/26/2020 9:30 AM Medical Record Number: [id number] Patient Account Number: [id number] Date of Birth/Sex: [birth year] (87 y.o. F) Treating RN: Donnamarie Poag Primary Care Desarea Ohagan: [name] Other Clinician: Referring Cyan Moultrie: [name] Treating Sutton Plake/Extender: Skipper Cliche in Treatment: 17 Wound Status Wound Number: 3 Primary Pressure Ulcer Etiology: Wound Location: Left Foot Wound Open Wounding Event: Pressure Injury Status: Date Acquired: 10/02/2020 Comorbid Cataracts, Anemia, Coronary Artery Disease, Weeks Of Treatment: 3 History: Hypertension, Myocardial Infarction, End Stage Renal Clustered Wound: No Disease, Gout, Osteoarthritis, Dementia Photos Wound Measurements Length: (cm) 0.4 % Reduc Width: (cm) 0.5 % Reduc Depth: (cm) 0.4 Tunneli Area: (cm) 0.157 Underm Volume: (cm) 0.063 Star Endin Maxim tion in Area: 50% tion in Volume: -103.2% ng: No ining: Yes ting Position (o'clock): 12 g Position (o'clock): 12 um Distance: (cm) 0.4 Wound Description Classification: Category/Stage III Foul O Wound Margin: Thickened Slough Exudate Amount: Medium Exudate Type: Serosanguineous Exudate Color: red, brown dor After Cleansing: No /Fibrino Yes Wound Bed Granulation Amount: Small (1-33%) Exposed Structure Granulation Quality: Red, Pink Fascia Exposed: No Necrotic Amount: Large (67-100%) Fat Layer (Subcutaneous Tissue) Exposed: Yes Necrotic Quality: Adherent Slough Tendon Exposed: No Muscle Exposed: No Joint Exposed: No Bone Exposed: No Treatment Notes Wound #3 (Foot) Wound Laterality: Left Cleanser Soap and Water [name] [name], [name] ([id number]) Discharge Instruction: Gently cleanse wound with  antibacterial soap, rinse and pat dry prior to dressing wounds Peri-Wound Care Topical Primary Dressing Hydrofera Blue Ready Transfer Foam, 2.5x2.5 (in/in) Discharge Instruction: Apply Hydrofera Blue Ready to wound bed as directed Secondary Dressing ABD Pad 5x9 (in/in) Discharge Instruction: Cover with ABD pad Secured With Compression Wrap Profore Lite LF 3 Multilayer Compression [place] Discharge Instruction: Apply 3 multi-layer wrap as prescribed. Compression Stockings Add-Ons Electronic Signature(s) Signed: 10/26/2020 10:20:49 AM By: Donnamarie Poag Entered By: Donnamarie Poag on 10/26/2020 09:55:38 Lindenbaum, Jamille [name]. ([id number]) -------------------------------------------------------------------------------- Vitals Details Patient Name: Mcelhinny, Nare [name]. Date of  Service: 10/26/2020 9:30 AM Medical Record Number: [id number] Patient Account Number: [id number] Date of Birth/Sex: April 29, 1926 (87 y.o. F) Treating RN: Donnamarie Poag Primary Care Rex Oesterle: [name] Other Clinician: Referring Jashun Puertas: [name] Treating Shota Kohrs/Extender: Skipper Cliche in Treatment: 17 Vital Signs Time Taken: 09:45 Temperature (F): 98.8 Height (in): 58 Pulse (bpm): 92 Weight (lbs): 137 Respiratory Rate (breaths/min): 16 Body Mass Index (BMI): 28.6 Blood Pressure (mmHg): 119/74 Reference Range: 80 - 120 mg / dl Electronic Signature(s) Signed: 10/26/2020 10:20:49 AM By: Donnamarie Poag Entered ByDonnamarie Poag on 10/26/2020 09:47:03

## 2020-10-30 ENCOUNTER — Other Ambulatory Visit: Payer: Self-pay

## 2020-10-30 ENCOUNTER — Encounter: Payer: PPO | Admitting: Physician Assistant

## 2020-10-30 DIAGNOSIS — L89893 Pressure ulcer of other site, stage 3: Secondary | ICD-10-CM | POA: Diagnosis not present

## 2020-10-30 DIAGNOSIS — A499 Bacterial infection, unspecified: Secondary | ICD-10-CM | POA: Diagnosis not present

## 2020-10-30 DIAGNOSIS — L89623 Pressure ulcer of left heel, stage 3: Secondary | ICD-10-CM | POA: Diagnosis not present

## 2020-10-30 NOTE — Progress Notes (Addendum)
Bargar, Camyra L. ([id number]) Visit Report for 10/30/2020 Chief Complaint Document Details Patient Name: [name] [name], [name] Date of Service: 10/30/2020 12:30 PM Medical Record Number: [id number] Patient Account Number: [id number] Date of Birth/Sex: 1926/07/28 (87 y.o. F) Treating RN: Donnamarie Poag Primary Care Provider: [name] Other Clinician: Referring Provider: [name] Treating Provider/Extender: Skipper Cliche in Treatment: 18 Information Obtained from: Patient Chief Complaint Left Heel Pressure Ulcer Electronic Signature(s) Signed: 10/30/2020 12:58:45 PM By: Worthy Keeler PA-C Entered By: Worthy Keeler on 10/30/2020 12:58:45 [name] [name], [name] ([id number]) -------------------------------------------------------------------------------- Debridement Details Patient Name: Gentz, Suzan L. Date of Service: 10/30/2020 12:30 PM Medical Record Number: [id number] Patient Account Number: [id number] Date of Birth/Sex: 1926/07/31 (87 y.o. F) Treating RN: Donnamarie Poag Primary Care Provider: [name] Other Clinician: Referring Provider: [name] Treating Provider/Extender: Skipper Cliche in Treatment: 18 Debridement Performed for Wound #3 Left Foot Assessment: Performed By: Physician Tommie Sams., PA-C Debridement Type: Debridement Level of Consciousness (Pre- Awake and Alert procedure): Pre-procedure Verification/Time Out Yes - 13:08 Taken: Start Time: 13:08 Pain Control: Lidocaine Total Area Debrided (L x W): 0.5 (cm) x 0.5 (cm) = 0.25 (cm) Tissue and other material Viable, Non-Viable, Slough, Subcutaneous, Biofilm, Slough debrided: Level: Skin/Subcutaneous Tissue Debridement Description: Excisional Instrument: Curette Bleeding: Minimum Hemostasis Achieved: Pressure Response to Treatment: Procedure was tolerated well Level of Consciousness (Post- Awake and Alert procedure): Post Debridement Measurements of Total Wound Length: (cm)  0.5 Stage: Category/Stage III Width: (cm) 0.5 Depth: (cm) 0.4 Volume: (cm) 0.079 Character of Wound/Ulcer Post Debridement: Improved Post Procedure Diagnosis Same as Pre-procedure Electronic Signature(s) Signed: 10/30/2020 3:19:14 PM By: Donnamarie Poag Signed: 10/30/2020 9:56:09 PM By: Worthy Keeler PA-C Entered By: Donnamarie Poag on 10/30/2020 13:09:48 [name] [name], [name] ([id number]) -------------------------------------------------------------------------------- HPI Details Patient Name: [name] [name], [name] Date of Service: 10/30/2020 12:30 PM Medical Record Number: [id number] Patient Account Number: [id number] Date of Birth/Sex: 09-22-1926 (87 y.o. F) Treating RN: Donnamarie Poag Primary Care Provider: [name] Other Clinician: Referring Provider: [name] Treating Provider/Extender: Skipper Cliche in Treatment: 18 History of Present Illness HPI Description: 87 year old patient was recently seen by the PCPs office for significant pain right great toe which has been going on since July. She was initially treated with Keflex which she did not complete and after the office visit this time she has been put on doxycycline. at the time of her visit she was found to have a ulcer on the plantar surface of the right great toe and also had a pyogenic granuloma over this area. X-ray of the right foot done 12/29/2014 -- IMPRESSION:Soft-tissue swelling and ulceration right great toe. No underlying bony lytic lesion identified. If osteomyelitis remains of clinical concern MRI can be obtained. Past medical history significant for anemia, chronic kidney disease stage III, obesity, varicose veins, coronary artery disease, gout, history of nicotine addiction given up smoking in 2002, hypertension, status post cardiac catheterization, status post abdominal hysterectomy, cholecystectomy and tonsillectomy. hemoglobin A1c done in August was 5.8 01/22/2015 -- at this stage the [facility] Walking boat was  going to cost them significant amount of money and they want to defer using that at the present time. 01/29/2015 -- she had a podiatry appointment and they have trimmed her toenails. She has not heard back from the vascular office regarding her venous duplex study and I have asked them to call personally so that they can get the appointment soon. 02/06/2015 -- they have made contact with the vascular office and from what I  understand that test has been done but the report is pending. 02/19/2014 -- the vascular test is scheduled for tomorrow Readmission: 06/26/2020 upon evaluation today patient appears for initial evaluation in her clinic that she has been here before in 2016 and has been quite sometime. She did have a fractured hip in February on the 23rd 2022. Subsequently this had to be pinned and she ended up with a pressure injury on her heel following when she was using her foot to help move her around in the bed. Subsequently this has led to the wound that she has been dealing with since that time. She lives at home with her daughter currently she does have dementia. 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 07/03/2020 upon evaluation today patient appears to be doing well with regard to her heel ulcer. She did have arterial studies they appear to be doing excellent it was premature normal across the board with TBI's in the 90s and ABIs well within normal range. Nonetheless there does not appear to be any signs of arterial insufficiency whatsoever. With that being said the patient does have also signs of improvement there is some necrotic tissue in the base of the wound number to try to clear some of that away today I do believe the Iodoflex/Iodosorb is doing well. 5/24; difficult punched out wound on the left medial heel. She has been using Iodoflex 07/17/2020  upon evaluation today patient appears to be doing well at this point in regard to her wound. I do feel like this is a little bit deeper but again that is what is expected as we continue with the Iodoflex I think that this is just going to get deeper until we get to the base of the wound. With that being said I think we are getting much closer to the base of the wound where we can have healthier tissue that we get a be managing here which that will be awesome. In the meantime I am not surprised by what I am seeing and in fact the wound appears to be better as compared to previous findings. 07/24/2020 upon evaluation today patient appears to be doing well with regard to her wound. Overall I am extremely pleased with where things stand today. I do not see any signs of active infection which is great and overall I think that the patient is making good progress. There is good to be some need for sharp debridement today. 07/31/2020 upon evaluation today patient appears to be doing better in regard to her heel ulcer. She has been tolerating the dressing changes without complication. Fortunately there does not appear to be any signs of active infection which is great and overall very pleased in this regard. No fevers, chills, nausea, vomiting, or diarrhea. 08/14/2020 upon evaluation today patient appears to be doing better in regard to her heel ulcer. I am very pleased in that regard. Unfortunately she still has a slight deep tissue injury in regard to the medial portion of her foot over the same area. Unfortunately I think this is something that if were not careful it was can open up into the wound. That is my main concern here based on what I see. Fortunately there does not appear to be any evidence of active infection which is great news and overall very pleased with where things stand at this point. 08/21/2020 upon evaluation today patient  appears to be doing well with regard to her wound. She has been  tolerating the dressing changes without complication. Fortunately there does not appear to be any signs of active infection at this time. No fevers, chills, nausea, vomiting, or diarrhea. I do believe the compression wrap was beneficial for her. 08/28/2020 upon evaluation today patient appears to be doing well with regard to her wounds currently. Fortunately there does not appear to be any signs of active infection at this time. No fevers, chills, nausea, vomiting, or diarrhea. With that being said I think that her leg is doing quite well to be honest. 09/04/2020 upon evaluation today patient appears to be doing well with regard to her wound on the heel. This is showing signs of good epithelial growth although there is probably can it be an indention where this heals that is okay as long as we get it closed. Fortunately I do not see any evidence of infection at this point. 09/13/2020 upon evaluation today patient appears to be doing well with regard to her wounds. She has been tolerating the dressing changes Messamore, Stevey L. ([id number]) without complication. Fortunately he is actually doing extremely well and I think she is making great progress this is measuring smaller. I would recommend that such that we continue with the [facility] likely since he is doing so well. 09/18/2020 upon evaluation today patient appears to be doing well with regard to her heel ulcer. She is making good progress and I am very pleased with what we see today. I think the Hydrofera Blue is still doing excellent. 10/02/2020 upon evaluation today patient's wound actually appears to be showing signs of good improvement in regard to the heel. I am very pleased in this regard. With that being said I do believe that the area which is somewhat been stable and dry is starting to lift up and beginning to clear this away as well that is on the foot. Subsequently I am going to have to perform some debridement here and we did actually  go ahead and allow this as a wound today as before it was just more deep tissue injury and eschar covering now I think it is a little bit more than that to be honest. 10/09/2020 upon evaluation today patient appears to be doing decently well in regard to her wounds. I am actually very pleased with where things stand today. There does not appear to be any signs of active infection which is great news. No fevers, chills, nausea, vomiting, or diarrhea. She is going require some sharp debridement today. 10/16/2020 upon evaluation today patient appears to be doing decently well in regard to her heel as well as the foot. Both are showing signs of good improvement which is great news. In general and extremely pleased with where things stand at this point. She is tolerating the [facility] well although this is getting so small in the heel I think collagen may be a better option here based on what I am seeing. With regard to the foot the Iodoflex/Iodosorb is still probably the best method here. 10/26/2020 upon evaluation today patient actually appears to be doing well in some respects today. She has been tolerating the dressing changes without complication and this is great news. The Hydrofera Blue is done well for the heel this actually appears to be healed today. With that being said in regard to the foot I think we are ready to switch to [facility] based on what I see at  this point. 10/30/2020 upon evaluation today patient appears to be doing well with regard to his wound. He has been tolerating the dressing changes without complication. Fortunately there does not appear to be any signs of active infection systemically at this time which is great news and overall very pleased with where things stand today. I do think that she is making progress although it somewhat slow still where showing signs of improvement little by little here. Electronic Signature(s) Signed: 10/30/2020 1:18:16 PM By: Worthy Keeler PA-C Entered By: Worthy Keeler on 10/30/2020 13:18:16 [name] [name], [name] ([id number]) -------------------------------------------------------------------------------- Physical Exam Details Patient Name: Eaddy, Shareta L. Date of Service: 10/30/2020 12:30 PM Medical Record Number: [id number] Patient Account Number: [id number] Date of Birth/Sex: Jul 02, 1926 (87 y.o. F) Treating RN: Donnamarie Poag Primary Care Provider: [name] Other Clinician: Referring Provider: [name] Treating Provider/Extender: Skipper Cliche in Treatment: 15 Constitutional Well-nourished and well-hydrated in no acute distress. Respiratory normal breathing without difficulty. Psychiatric this patient is able to make decisions and demonstrates good insight into disease process. Alert and Oriented x 3. pleasant and cooperative. Notes Upon inspection patient's wound bed showed signs of good granulation epithelization at this point. Fortunately there does not appear to be any evidence of active infection systemically which is great news and overall I am extremely pleased with where we stand today. No fevers, chills, nausea, vomiting, or diarrhea. I did perform debridement and cleared away some of the callus around the edges of the wound as well as the slough from the surface of the wound postdebridement wound bed appears to be doing better confirmed by Mole[employer] DX imaging as well fluorescence was much brighter in the red/blush cues to begin with postdebridement this was improved. Electronic Signature(s) Signed: 10/30/2020 1:18:54 PM By: Worthy Keeler PA-C Entered By: Worthy Keeler on 10/30/2020 13:18:54 [name] [name], [name] ([id number]) -------------------------------------------------------------------------------- Physician Orders Details Patient Name: [name] [name], [name] Date of Service: 10/30/2020 12:30 PM Medical Record Number: [id number] Patient Account Number: [id number] Date of Birth/Sex:  07/14/1926 (87 y.o. F) Treating RN: Donnamarie Poag Primary Care Provider: [name] Other Clinician: Referring Provider: [name] Treating Provider/Extender: Skipper Cliche in Treatment: 45 Verbal / Phone Orders: No Diagnosis Coding ICD-10 Coding Code Description [phone] Pressure ulcer of left heel, stage 3 L89.893 Pressure ulcer of other site, stage 3 I10 Essential (primary) hypertension F01.50 Vascular dementia without behavioral disturbance I25.10 Atherosclerotic heart disease of native coronary artery without angina pectoris Follow-up Appointments o Return Appointment in 1 week. o Nurse Visit as needed Bathing/ Shower/ Hygiene o May shower with wound dressing protected with water repellent cover or cast protector. o No tub bath. Edema Control - Lymphedema / Segmental Compressive Device / Other o Patient to wear own compression stockings. Remove compression stockings every night before going to bed and put on every morning when getting up. - right leg o Elevate legs to the level of the heart and pump ankles as often as possible o Elevate leg(s) parallel to the floor when sitting. o DO YOUR BEST to sleep in the bed at night. DO NOT sleep in your recliner. Long hours of sitting in a recliner leads to swelling of the legs and/or potential wounds on your backside. Off-Loading o Other: - Offloading boots while in bed; Try pillow under sheet to keep it in place. Wound Treatment Wound #3 - Foot Wound Laterality: Left Cleanser: Soap and Water 1 x Per Week/30 Days Discharge Instructions: Gently cleanse wound with antibacterial soap, rinse  and pat dry prior to dressing wounds Primary Dressing: Hydrofera Blue Ready Transfer Foam, 2.5x2.5 (in/in) 1 x Per Week/30 Days Discharge Instructions: Apply Hydrofera Blue Ready to wound bed as directed Secondary Dressing: ABD Pad 5x9 (in/in) 1 x Per Week/30 Days Discharge Instructions: Cover with ABD pad Compression  Wrap: Profore Lite LF 3 Multilayer Compression Bandaging System 1 x Per Week/30 Days Discharge Instructions: Apply 3 multi-layer wrap as prescribed. Electronic Signature(s) Signed: 10/30/2020 3:19:14 PM By: Donnamarie Poag Signed: 10/30/2020 9:56:09 PM By: Worthy Keeler PA-C Entered By: Donnamarie Poag on 10/30/2020 13:11:18 [name] [name], [name] ([id number]) -------------------------------------------------------------------------------- Problem List Details Patient Name: [name] [name], [name] Date of Service: 10/30/2020 12:30 PM Medical Record Number: [id number] Patient Account Number: [id number] Date of Birth/Sex: October 22, 1926 (87 y.o. F) Treating RN: Donnamarie Poag Primary Care Provider: [name] Other Clinician: Referring Provider: [name] Treating Provider/Extender: Skipper Cliche in Treatment: 18 Active Problems ICD-10 Encounter Code Description Active Date MDM Diagnosis [phone] Pressure ulcer of left heel, stage 3 06/26/2020 No Yes L89.893 Pressure ulcer of other site, stage 3 10/02/2020 No Yes I10 Essential (primary) hypertension 06/26/2020 No Yes F01.50 Vascular dementia without behavioral disturbance 06/26/2020 No Yes I25.10 Atherosclerotic heart disease of native coronary artery without angina 06/26/2020 No Yes pectoris Inactive Problems Resolved Problems Electronic Signature(s) Signed: 10/30/2020 12:58:35 PM By: Worthy Keeler PA-C Entered By: Worthy Keeler on 10/30/2020 12:58:35 [name] [name], [name] ([id number]) -------------------------------------------------------------------------------- Progress Note Details Patient Name: [name] [name], [name] Date of Service: 10/30/2020 12:30 PM Medical Record Number: [id number] Patient Account Number: [id number] Date of Birth/Sex: 04/16/1926 (87 y.o. F) Treating RN: Donnamarie Poag Primary Care Provider: [name] Other Clinician: Referring Provider: [name] Treating Provider/Extender: Skipper Cliche in Treatment:  18 Subjective Chief Complaint Information obtained from Patient Left Heel Pressure Ulcer History of Present Illness (HPI) 87 year old patient was recently seen by the PCPs office for significant pain right great toe which has been going on since July. She was initially treated with Keflex which she did not complete and after the office visit this time she has been put on doxycycline. at the time of her visit she was found to have a ulcer on the plantar surface of the right great toe and also had a pyogenic granuloma over this area. X-ray of the right foot done 12/29/2014 -- IMPRESSION:Soft-tissue swelling and ulceration right great toe. No underlying bony lytic lesion identified. If osteomyelitis remains of clinical concern MRI can be obtained. Past medical history significant for anemia, chronic kidney disease stage III, obesity, varicose veins, coronary artery disease, gout, history of nicotine addiction given up smoking in 2002, hypertension, status post cardiac catheterization, status post abdominal hysterectomy, cholecystectomy and tonsillectomy. hemoglobin A1c done in August was 5.8 01/22/2015 -- at this stage the [facility] Walking boat was going to cost them significant amount of money and they want to defer using that at the present time. 01/29/2015 -- she had a podiatry appointment and they have trimmed her toenails. She has not heard back from the vascular office regarding her venous duplex study and I have asked them to call personally so that they can get the appointment soon. 02/06/2015 -- they have made contact with the vascular office and from what I understand that test has been done but the report is pending. 02/19/2014 -- the vascular test is scheduled for tomorrow Readmission: 06/26/2020 upon evaluation today patient appears for initial evaluation in her clinic that she has been here before in 2016 and has been quite sometime.  She did have a fractured hip in February on the 23rd  2022. Subsequently this had to be pinned and she ended up with a pressure injury on her heel following when she was using her foot to help move her around in the bed. Subsequently this has led to the wound that she has been dealing with since that time. She lives at home with her daughter currently she does have dementia. 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 07/03/2020 upon evaluation today patient appears to be doing well with regard to her heel ulcer. She did have arterial studies they appear to be doing excellent it was premature normal across the board with TBI's in the 90s and ABIs well within normal range. Nonetheless there does not appear to be any signs of arterial insufficiency whatsoever. With that being said the patient does have also signs of improvement there is some necrotic tissue in the base of the wound number to try to clear some of that away today I do believe the Iodoflex/Iodosorb is doing well. 5/24; difficult punched out wound on the left medial heel. She has been using Iodoflex 07/17/2020 upon evaluation today patient appears to be doing well at this point in regard to her wound. I do feel like this is a little bit deeper but again that is what is expected as we continue with the Iodoflex I think that this is just going to get deeper until we get to the base of the wound. With that being said I think we are getting much closer to the base of the wound where we can have healthier tissue that we get a be managing here which that will be awesome. In the meantime I am not surprised by what I am seeing and in fact the wound appears to be better as compared to previous findings. 07/24/2020 upon evaluation today patient appears to be doing well with regard to her wound. Overall I am extremely pleased with where things stand today. I do not see any  signs of active infection which is great and overall I think that the patient is making good progress. There is good to be some need for sharp debridement today. 07/31/2020 upon evaluation today patient appears to be doing better in regard to her heel ulcer. She has been tolerating the dressing changes without complication. Fortunately there does not appear to be any signs of active infection which is great and overall very pleased in this regard. No fevers, chills, nausea, vomiting, or diarrhea. 08/14/2020 upon evaluation today patient appears to be doing better in regard to her heel ulcer. I am very pleased in that regard. Unfortunately she still has a slight deep tissue injury in regard to the medial portion of her foot over the same area. Unfortunately I think this is something that if were not careful it was can open up into the wound. That is my main concern here based on what I see. Fortunately there does not appear to be any evidence of active infection which is great news and overall very pleased with where things stand at this point. 08/21/2020 upon evaluation today patient appears to be doing well with regard to her wound. She has been tolerating the dressing changes without complication. Fortunately there does not appear to be any signs of active infection at this time. No fevers, chills, nausea, vomiting, or diarrhea. I do believe the  compression wrap was beneficial for her. 08/28/2020 upon evaluation today patient appears to be doing well with regard to her wounds currently. Fortunately there does not appear to be any signs of active infection at this time. No fevers, chills, nausea, vomiting, or diarrhea. With that being said I think that her leg is doing quite well to be honest. [name] [name], [name] ([id number]) 09/04/2020 upon evaluation today patient appears to be doing well with regard to her wound on the heel. This is showing signs of good epithelial growth although there is probably can  it be an indention where this heals that is okay as long as we get it closed. Fortunately I do not see any evidence of infection at this point. 09/13/2020 upon evaluation today patient appears to be doing well with regard to her wounds. She has been tolerating the dressing changes without complication. Fortunately he is actually doing extremely well and I think she is making great progress this is measuring smaller. I would recommend that such that we continue with the [facility] likely since he is doing so well. 09/18/2020 upon evaluation today patient appears to be doing well with regard to her heel ulcer. She is making good progress and I am very pleased with what we see today. I think the Hydrofera Blue is still doing excellent. 10/02/2020 upon evaluation today patient's wound actually appears to be showing signs of good improvement in regard to the heel. I am very pleased in this regard. With that being said I do believe that the area which is somewhat been stable and dry is starting to lift up and beginning to clear this away as well that is on the foot. Subsequently I am going to have to perform some debridement here and we did actually go ahead and allow this as a wound today as before it was just more deep tissue injury and eschar covering now I think it is a little bit more than that to be honest. 10/09/2020 upon evaluation today patient appears to be doing decently well in regard to her wounds. I am actually very pleased with where things stand today. There does not appear to be any signs of active infection which is great news. No fevers, chills, nausea, vomiting, or diarrhea. She is going require some sharp debridement today. 10/16/2020 upon evaluation today patient appears to be doing decently well in regard to her heel as well as the foot. Both are showing signs of good improvement which is great news. In general and extremely pleased with where things stand at this point. She is  tolerating the [facility] well although this is getting so small in the heel I think collagen may be a better option here based on what I am seeing. With regard to the foot the Iodoflex/Iodosorb is still probably the best method here. 10/26/2020 upon evaluation today patient actually appears to be doing well in some respects today. She has been tolerating the dressing changes without complication and this is great news. The Hydrofera Blue is done well for the heel this actually appears to be healed today. With that being said in regard to the foot I think we are ready to switch to [facility] based on what I see at this point. 10/30/2020 upon evaluation today patient appears to be doing well with regard to his wound. He has been tolerating the dressing changes without complication. Fortunately there does not appear to be any signs of active infection systemically at this time which is great  news and overall very pleased with where things stand today. I do think that she is making progress although it somewhat slow still where showing signs of improvement little by little here. Objective Constitutional Well-nourished and well-hydrated in no acute distress. Vitals Time Taken: 12:43 PM, Height: 58 in, Weight: 137 lbs, BMI: 28.6, Temperature: 98.1 F, Pulse: 76 bpm, Respiratory Rate: 16 breaths/min, Blood Pressure: 174/80 mmHg. Respiratory normal breathing without difficulty. Psychiatric this patient is able to make decisions and demonstrates good insight into disease process. Alert and Oriented x 3. pleasant and cooperative. General Notes: Upon inspection patient's wound bed showed signs of good granulation epithelization at this point. Fortunately there does not appear to be any evidence of active infection systemically which is great news and overall I am extremely pleased with where we stand today. No fevers, chills, nausea, vomiting, or diarrhea. I did perform debridement and cleared away  some of the callus around the edges of the wound as well as the slough from the surface of the wound postdebridement wound bed appears to be doing better confirmed by MolecuLight DX imaging as well fluorescence was much brighter in the red/blush cues to begin with postdebridement this was improved. Integumentary (Hair, Skin) Wound #3 status is Open. Original cause of wound was Pressure Injury. The date acquired was: 10/02/2020. The wound has been in treatment 4 weeks. The wound is located on the Left Foot. The wound measures 0.5cm length x 0.5cm width x 0.4cm depth; 0.196cm^2 area and 0.079cm^3 volume. There is Fat Layer (Subcutaneous Tissue) exposed. There is no tunneling or undermining noted. There is a medium amount of serosanguineous drainage noted. The wound margin is thickened. There is large (67-100%) red, pink granulation within the wound bed. There is a small (1-33%) amount of necrotic tissue within the wound bed including Adherent Slough. Assessment [name] [name], [name] ([id number]) Active Problems ICD-10 Pressure ulcer of left heel, stage 3 Pressure ulcer of other site, stage 3 Essential (primary) hypertension Vascular dementia without behavioral disturbance Atherosclerotic heart disease of native coronary artery without angina pectoris Procedures Wound #3 Pre-procedure diagnosis of Wound #3 is a Pressure Ulcer located on the Left Foot . There was a Excisional Skin/Subcutaneous Tissue Debridement with a total area of 0.25 sq cm performed by Tommie Sams., PA-C. With the following instrument(s): Curette to remove Viable and Non-Viable tissue/material. Material removed includes Subcutaneous Tissue, Slough, and Biofilm after achieving pain control using Lidocaine. A time out was conducted at 13:08, prior to the start of the procedure. A Minimum amount of bleeding was controlled with Pressure. The procedure was tolerated well. Post Debridement Measurements: 0.5cm length x 0.5cm width x  0.4cm depth; 0.079cm^3 volume. Post debridement Stage noted as Category/Stage III. Character of Wound/Ulcer Post Debridement is improved. Post procedure Diagnosis Wound #3: Same as Pre-Procedure Pre-procedure diagnosis of Wound #3 is a Pressure Ulcer located on the Left Foot . There was a Three Layer Compression Therapy Procedure by Donnamarie Poag, RN. Post procedure Diagnosis Wound #3: Same as Pre-Procedure Plan Follow-up Appointments: Return Appointment in 1 week. Nurse Visit as needed Bathing/ Shower/ Hygiene: May shower with wound dressing protected with water repellent cover or cast protector. No tub bath. Edema Control - Lymphedema / Segmental Compressive Device / Other: Patient to wear own compression stockings. Remove compression stockings every night before going to bed and put on every morning when getting up. - right leg Elevate legs to the level of the heart and pump ankles as often as possible Elevate leg(s)  parallel to the floor when sitting. DO YOUR BEST to sleep in the bed at night. DO NOT sleep in your recliner. Long hours of sitting in a recliner leads to swelling of the legs and/or potential wounds on your backside. Off-Loading: Other: - Offloading boots while in bed; Try pillow under sheet to keep it in place. WOUND #3: - Foot Wound Laterality: Left Cleanser: Soap and Water 1 x Per Week/30 Days Discharge Instructions: Gently cleanse wound with antibacterial soap, rinse and pat dry prior to dressing wounds Primary Dressing: Hydrofera Blue Ready Transfer Foam, 2.5x2.5 (in/in) 1 x Per Week/30 Days Discharge Instructions: Apply Hydrofera Blue Ready to wound bed as directed Secondary Dressing: ABD Pad 5x9 (in/in) 1 x Per Week/30 Days Discharge Instructions: Cover with ABD pad Compression Wrap: Profore Lite LF 3 Multilayer Compression Bandaging System 1 x Per Week/30 Days Discharge Instructions: Apply 3 multi-layer wrap as prescribed. 1. Would recommend currently that we  going to continue with the wound care measures as before with [facility] I think this is still the best way to go. 2. I am also going to recommend that we have the patient continue to monitor for any signs of worsening from the standpoint of infection. Right now I feel like she is making good progress. Hopefully this will continue to be the case. 3. I am also can recommend that the patient continue with a 3 layer compression wrap which is helping with edema control I think this is doing a great job. We will see patient back for reevaluation in 1 week here in the clinic. If anything worsens or changes patient will contact our office for additional recommendations. [name] [name], [name] [name] L. ([id number]) MolecuLight Medical Necessity Initial Evaluation of the wound with MolecuLightDX to determine Fluorescence bacterial imaging was medically necessary today due to: baseline bacterial bioburden level MolecuLight Procedure The MolecularLight DX device was cleaned with a disinfectant wipe prior to use., The correct patient profile was confirmed and correct wound was verified., Range finder sensor used to ensure appropriate distance The following was completed: selected between imaging unit and wound bed, Room lights were turned off and the ambient light sensor was checked., Blue circle appeared around the lightbulb., The fluorescence icon was selected. Screen was tapped to enhance focus and the image was captured. Additional drapes were used to ensure adequate darknesso No MolecuLight Results The results of todayos scan revealed: Red Colors, Yellow Colors, Green Colors The indicated colors were noted in the following areas. In the periphery of the wound As a result of todayos scan, the following treatment plans were put in Targeted debridement in the areas of red fluorescence with aggressive place. cleansing. Potential ICD-10 Codes ICD-10 49.9 Bacterial infection unspecified (Red Color) Electronic  Signature(s) Signed: 10/30/2020 1:21:29 PM By: Worthy Keeler PA-C Previous Signature: 10/30/2020 1:20:05 PM Version By: Worthy Keeler PA-C Entered By: Worthy Keeler on 10/30/2020 13:21:28 Wetherington, Stacie L. ([id number]) -------------------------------------------------------------------------------- SuperBill Details Patient Name: Scherman, Zamia L. Date of Service: 10/30/2020 Medical Record Number: [id number] Patient Account Number: [id number] Date of Birth/Sex: 1926-06-03 (87 y.o. F) Treating RN: Donnamarie Poag Primary Care Provider: [name] Other Clinician: Referring Provider: [name] Treating Provider/Extender: Skipper Cliche in Treatment: 18 Diagnosis Coding ICD-10 Codes Code Description [id number] Pressure ulcer of left heel, stage 3 L89.893 Pressure ulcer of other site, stage 3 I10 Essential (primary) hypertension F01.50 Vascular dementia without behavioral disturbance I25.10 Atherosclerotic heart disease of native coronary artery without angina pectoris Facility Procedures CPT4 Code: [id number] Description:  11042 - DEB SUBQ TISSUE 20 SQ CM/< Modifier: Quantity: 1 CPT4 Code: Description: ICD-10 Diagnosis Description L89.893 Pressure ulcer of other site, stage 3 Modifier: Quantity: CPT4 Code: [id number] Description: NONCNTACT RT FLORO WND 1ST STE Modifier: Quantity: 1 CPT4 Code: Description: ICD-10 Diagnosis Description L89.893 Pressure ulcer of other site, stage 3 Modifier: Quantity: Physician Procedures CPT4 [id number] Description: [id number] - WC PHYS SUBQ TISS 20 SQ CM Modifier: Quantity: 1 CPT4 Code: Description: ICD-10 Diagnosis Description L89.893 Pressure ulcer of other site, stage 3 Modifier: Quantity: CPT4 Code: [id number] Description: HC MOLECULIGHT WND IMG 1ST ANATOMIC SITE Modifier: Quantity: 1 CPT4 Code: Description: ICD-10 Diagnosis Description L89.893 Pressure ulcer of other site, stage 3 Modifier: Quantity: Electronic Signature(s) Signed:  10/30/2020 1:21:53 PM By: Worthy Keeler PA-C Entered By: Worthy Keeler on 10/30/2020 13:21:53

## 2020-10-30 NOTE — Progress Notes (Addendum)
Knoble, Kobie L. ([id number]) Visit Report for 10/30/2020 Arrival Information Details Patient Name: [name] [name], [name] Date of Service: 10/30/2020 12:30 PM Medical Record Number: [id number] Patient Account Number: [id number] Date of Birth/Sex: December 09, 1926 (87 y.o. F) Treating RN: Donnamarie Poag Primary Care Santonio Speakman: [name] Other Clinician: Referring Samanda Buske: [name] Treating Khrystyne Arpin/Extender: Skipper Cliche in Treatment: 18 Visit Information History Since Last Visit Added or deleted any medications: No Patient Arrived: Wheel Chair Had a fall or experienced change in No Arrival Time: 12:43 activities of daily living that may affect Accompanied By: daughter risk of falls: Transfer Assistance: EasyPivot Patient Lift Hospitalized since last visit: No Patient Identification Verified: Yes Has Dressing in Place as Prescribed: Yes Secondary Verification Process Completed: Yes Has Compression in Place as Prescribed: Yes Patient Requires Transmission-Based No Pain Present Now: No Precautions: Patient Has Alerts: Yes Patient Alerts: NOT diabetic ABI R1.18 L1.25 06/27/20 Electronic Signature(s) Signed: 10/30/2020 3:19:14 PM By: Donnamarie Poag Entered ByDonnamarie Poag on 10/30/2020 12:43:35 Paulding, Karmah L. ([id number]) -------------------------------------------------------------------------------- Clinic Level of Care Assessment Details Patient Name: [name], [name] Date of Service: 10/30/2020 12:30 PM Medical Record Number: [id number] Patient Account Number: [id number] Date of Birth/Sex: Oct 11, 1926 (87 y.o. F) Treating RN: Donnamarie Poag Primary Care Tiffanye Hartmann: [name] Other Clinician: Referring Kostas Marrow: [name] Treating Shauntea Lok/Extender: Skipper Cliche in Treatment: 18 Clinic Level of Care Assessment Items TOOL 1 Quantity Score []  - Use when EandM and Procedure is performed on INITIAL visit 0 ASSESSMENTS - Nursing Assessment / Reassessment []  -  General Physical Exam (combine w/ comprehensive assessment (listed just below) when performed on new 0 pt. evals) []  - 0 Comprehensive Assessment (HX, ROS, Risk Assessments, Wounds Hx, etc.) ASSESSMENTS - Wound and Skin Assessment / Reassessment []  - Dermatologic / Skin Assessment (not related to wound area) 0 ASSESSMENTS - Ostomy and/or Continence Assessment and Care []  - Incontinence Assessment and Management 0 []  - 0 Ostomy Care Assessment and Management (repouching, etc.) PROCESS - Coordination of Care []  - Simple Patient / Family Education for ongoing care 0 []  - 0 Complex (extensive) Patient / Family Education for ongoing care []  - 0 Staff obtains Programmer, systems, Records, Test Results / Process Orders []  - 0 Staff telephones HHA, Nursing Homes / Clarify orders / etc []  - 0 Routine Transfer to another Facility (non-emergent condition) []  - 0 Routine Hospital Admission (non-emergent condition) []  - 0 New Admissions / Biomedical engineer / Ordering NPWT, Apligraf, etc. []  - 0 Emergency Hospital Admission (emergent condition) PROCESS - Special Needs []  - Pediatric / Minor Patient Management 0 []  - 0 Isolation Patient Management []  - 0 Hearing / Language / Visual special needs []  - 0 Assessment of Community assistance (transportation, D/C planning, etc.) []  - 0 Additional assistance / Altered mentation []  - 0 Support Surface(s) Assessment (bed, cushion, seat, etc.) INTERVENTIONS - Miscellaneous []  - External ear exam 0 []  - 0 Patient Transfer (multiple staff / Civil Service fast streamer / Similar devices) []  - 0 Simple Staple / Suture removal (25 or less) []  - 0 Complex Staple / Suture removal (26 or more) []  - 0 Hypo/Hyperglycemic Management (do not check if billed separately) []  - 0 Ankle / Brachial Index (ABI) - do not check if billed separately Has the patient been seen at the hospital within the last three years: Yes Total Score: 0 Level Of Care: ____ Volpe, Chandni L.  ([id number]) Electronic Signature(s) Signed: 10/30/2020 3:19:14 PM By: Donnamarie Poag Entered By: Donnamarie Poag on 10/30/2020 13:11:24 Bonham, Vessie L. (  [id number]) -------------------------------------------------------------------------------- Compression Therapy Details Patient Name: [name], [name] Date of Service: 10/30/2020 12:30 PM Medical Record Number: [id number] Patient Account Number: [id number] Date of Birth/Sex: Dec 19, 1926 (87 y.o. F) Treating RN: Donnamarie Poag Primary Care Camora Tremain: [name] Other Clinician: Referring Shlonda Dolloff: [name] Treating Mearl Olver/Extender: Skipper Cliche in Treatment: 18 Compression Therapy Performed for Wound Assessment: Wound #3 Left Foot Performed By: Clinician Donnamarie Poag, RN Compression Type: Three Layer Post Procedure Diagnosis Same as Pre-procedure Electronic Signature(s) Signed: 10/30/2020 3:19:14 PM By: Donnamarie Poag Entered By: Donnamarie Poag on 10/30/2020 13:10:03 Branscome, Lasaundra L. ([id number]) -------------------------------------------------------------------------------- Encounter Discharge Information Details Patient Name: [name], [name] Date of Service: 10/30/2020 12:30 PM Medical Record Number: [id number] Patient Account Number: [id number] Date of Birth/Sex: 1926/08/19 (87 y.o. F) Treating RN: Donnamarie Poag Primary Care Deshane Cotroneo: [name] Other Clinician: Referring Temisha Murley: [name] Treating Lovenia Debruler/Extender: Skipper Cliche in Treatment: 18 Encounter Discharge Information Items Post Procedure Vitals Discharge Condition: Stable Temperature (F): 98.1 Ambulatory Status: Wheelchair Pulse (bpm): 76 Discharge Destination: Home Respiratory Rate (breaths/min): 16 Transportation: Private Auto Blood Pressure (mmHg): 174/80 Accompanied By: daughter Schedule Follow-up Appointment: Yes Clinical Summary of Care: Electronic Signature(s) Signed: 10/30/2020 3:19:14 PM By: Donnamarie Poag Entered ByDonnamarie Poag on  10/30/2020 13:32:06 Bromwell, Preslee L. ([id number]) -------------------------------------------------------------------------------- Lower Extremity Assessment Details Patient Name: Youngren, Taheera L. Date of Service: 10/30/2020 12:30 PM Medical Record Number: [id number] Patient Account Number: [id number] Date of Birth/Sex: 09-03-1926 (87 y.o. F) Treating RN: Donnamarie Poag Primary Care Marice Angelino: [name] Other Clinician: Referring Eldena Dede: [name] Treating Adham Johnson/Extender: Skipper Cliche in Treatment: 18 Edema Assessment Assessed: [Left: Yes] [Right: No] Edema: [Left: N] [Right: o] Calf Left: Right: Point of Measurement: 32 cm From Medial Instep 32 cm Ankle Left: Right: Point of Measurement: 12 cm From Medial Instep 22 cm Vascular Assessment Pulses: Dorsalis Pedis Palpable: [Left:Yes] Electronic Signature(s) Signed: 10/30/2020 3:19:14 PM By: Donnamarie Poag Entered ByDonnamarie Poag on 10/30/2020 12:52:30 Cimo, Addis L. ([id number]) -------------------------------------------------------------------------------- Multi Wound Chart Details Patient Name: Barot, Abrish L. Date of Service: 10/30/2020 12:30 PM Medical Record Number: [id number] Patient Account Number: [id number] Date of Birth/Sex: [birth year] (87 y.o. F) Treating RN: Donnamarie Poag Primary Care Dickson Kostelnik: [name] Other Clinician: Referring Torez Beauregard: [name] Treating Taneal Sonntag/Extender: Skipper Cliche in Treatment: 18 Vital Signs Height(in): 32 Pulse(bpm): 61 Weight(lbs): 137 Blood Pressure(mmHg): 174/80 Body Mass Index(BMI): 29 Temperature(F): 98.1 Respiratory Rate(breaths/min): 16 Photos: [N/A:N/A] Wound Location: Left Foot N/A N/A Wounding Event: Pressure Injury N/A N/A Primary Etiology: Pressure Ulcer N/A N/A Comorbid History: Cataracts, Anemia, Coronary Artery N/A N/A Disease, Hypertension, Myocardial Infarction, End Stage Renal Disease, Gout, Osteoarthritis, Dementia Date  Acquired: 10/02/2020 N/A N/A Weeks of Treatment: 4 N/A N/A Wound Status: Open N/A N/A Measurements L x W x D (cm) 0.5x0.5x0.4 N/A N/A Area (cm) : 0.196 N/A N/A Volume (cm) : 0.079 N/A N/A % Reduction in Area: 37.60% N/A N/A % Reduction in Volume: -154.80% N/A N/A Classification: Category/Stage III N/A N/A Exudate Amount: Medium N/A N/A Exudate Type: Serosanguineous N/A N/A Exudate Color: red, brown N/A N/A Wound Margin: Thickened N/A N/A Granulation Amount: Large (67-100%) N/A N/A Granulation Quality: Red, Pink N/A N/A Necrotic Amount: Small (1-33%) N/A N/A Exposed Structures: Fat Layer (Subcutaneous Tissue): N/A N/A Yes Fascia: No Tendon: No Muscle: No Joint: No Bone: No Treatment Notes Electronic Signature(s) Signed: 10/30/2020 3:19:14 PM By: Donnamarie Poag Entered ByDonnamarie Poag on 10/30/2020 13:04:29 Tuohy, Sherline L. ([id number]) -------------------------------------------------------------------------------- [facility] Details Patient Name: Earnhart, [name] Date  of Service: 10/30/2020 12:30 PM Medical Record Number: [id number] Patient Account Number: [id number] Date of Birth/Sex: [birth year] (87 y.o. F) Treating RN: Donnamarie Poag Primary Care Nataniel Gasper: [name] Other Clinician: Referring Lunna Vogelgesang: [name] Treating Bayleigh Loflin/Extender: Skipper Cliche in Treatment: 18 Active Inactive Wound/Skin Impairment Nursing Diagnoses: Impaired tissue integrity Goals: Patient/caregiver will verbalize understanding of skin care regimen Date Initiated: 06/26/2020 Date Inactivated: 08/21/2020 Target Resolution Date: 06/26/2020 Goal Status: Met Ulcer/skin breakdown will have a volume reduction of 30% by week 4 Date Initiated: 06/26/2020 Date Inactivated: 08/21/2020 Target Resolution Date: 07/27/2020 Goal Status: Met Ulcer/skin breakdown will have a volume reduction of 50% by week 8 Date Initiated: 06/26/2020 Date Inactivated: 09/18/2020 Target Resolution  Date: 08/26/2020 Goal Status: Unmet Unmet Reason: con't tx Ulcer/skin breakdown will have a volume reduction of 80% by week 12 Date Initiated: 06/26/2020 Date Inactivated: 10/30/2020 Target Resolution Date: 09/26/2020 Goal Status: Met Ulcer/skin breakdown will heal within 14 weeks Date Initiated: 06/26/2020 Target Resolution Date: 10/27/2020 Goal Status: Active Interventions: Assess patient/caregiver ability to obtain necessary supplies Assess patient/caregiver ability to perform ulcer/skin care regimen upon admission and as needed Assess ulceration(s) every visit Provide education on ulcer and skin care Treatment Activities: Referred to DME Tekila Caillouet for dressing supplies : 06/26/2020 Skin care regimen initiated : 06/26/2020 Notes: Electronic Signature(s) Signed: 10/30/2020 3:19:14 PM By: Donnamarie Poag Entered ByDonnamarie Poag on 10/30/2020 12:52:50 Waldrep, Orelia L. ([id number]) -------------------------------------------------------------------------------- Pain Assessment Details Patient Name: Mirante, Raseel L. Date of Service: 10/30/2020 12:30 PM Medical Record Number: [id number] Patient Account Number: [id number] Date of Birth/Sex: [birth year] (87 y.o. F) Treating RN: Donnamarie Poag Primary Care Nicolasa Milbrath: [name] Other Clinician: Referring Rikia Sukhu: [name] Treating Ronna Herskowitz/Extender: Skipper Cliche in Treatment: 18 Active Problems Location of Pain Severity and Description of Pain Patient Has Paino No Site Locations Rate the pain. Current Pain Level: 0 Pain Management and Medication Current Pain Management: Electronic Signature(s) Signed: 10/30/2020 3:19:14 PM By: Donnamarie Poag Entered By: Donnamarie Poag on 10/30/2020 12:50:16 Nierenberg, Rebekha L. ([id number]) -------------------------------------------------------------------------------- Patient/Caregiver Education Details Patient Name: Lemley, Lacrecia L. Date of Service: 10/30/2020 12:30 PM Medical Record Number:  [id number] Patient Account Number: [id number] Date of Birth/Gender: 1926-06-05 (87 y.o. F) Treating RN: Donnamarie Poag Primary Care Physician: [name] Other Clinician: Referring Physician: [name] Treating Physician/Extender: Skipper Cliche in Treatment: 18 Education Assessment Education Provided To: Patient and Caregiver Education Topics Provided Wound Debridement: Wound/Skin Impairment: Electronic Signature(s) Signed: 10/30/2020 3:19:14 PM By: Donnamarie Poag Entered ByDonnamarie Poag on 10/30/2020 13:11:33 Sooy, Gwendolynn L. ([id number]) -------------------------------------------------------------------------------- Wound Assessment Details Patient Name: Scallon, Emyah L. Date of Service: 10/30/2020 12:30 PM Medical Record Number: [id number] Patient Account Number: [id number] Date of Birth/Sex: 1926-12-23 (87 y.o. F) Treating RN: Donnamarie Poag Primary Care Aahil Fredin: [name] Other Clinician: Referring Allizon Woznick: [name] Treating Ajai Terhaar/Extender: Skipper Cliche in Treatment: 18 Wound Status Wound Number: 3 Primary Pressure Ulcer Etiology: Wound Location: Left Foot Wound Open Wounding Event: Pressure Injury Status: Date Acquired: 10/02/2020 Comorbid Cataracts, Anemia, Coronary Artery Disease, Weeks Of Treatment: 4 History: Hypertension, Myocardial Infarction, End Stage Renal Clustered Wound: No Disease, Gout, Osteoarthritis, Dementia Photos Wound Measurements Length: (cm) 0.5 Width: (cm) 0.4 Depth: (cm) 0.4 Area: (cm) 0.157 Volume: (cm) 0.063 % Reduction in Area: 50% % Reduction in Volume: -103.2% Tunneling: No Undermining: No Wound Description Classification: Category/Stage III Foul Wound Margin: Thickened Sloug Exudate Amount: Medium Exudate Type: Serosanguineous Exudate Color: red, brown Odor After Cleansing: No h/Fibrino Yes Wound Bed Granulation Amount: Medium (34-66%) Exposed Structure Granulation  Quality: Red, Pink Fascia  Exposed: No Necrotic Amount: Medium (34-66%) Fat Layer (Subcutaneous Tissue) Exposed: Yes Necrotic Quality: Adherent Slough Tendon Exposed: No Muscle Exposed: No Kriesel, Akiya L. ([id number]) Joint Exposed: No Bone Exposed: No Treatment Notes Wound #3 (Foot) Wound Laterality: Left Cleanser Soap and Water Discharge Instruction: Gently cleanse wound with antibacterial soap, rinse and pat dry prior to dressing wounds Peri-Wound Care Topical Primary Dressing Hydrofera Blue Ready Transfer Foam, 2.5x2.5 (in/in) Discharge Instruction: Apply Hydrofera Blue Ready to wound bed as directed Secondary Dressing ABD Pad 5x9 (in/in) Discharge Instruction: Cover with ABD pad Secured With Compression Wrap Profore Lite LF 3 Multilayer Compression Franquez Discharge Instruction: Apply 3 multi-layer wrap as prescribed. Compression Stockings Add-Ons Electronic Signature(s) Signed: 10/31/2020 11:55:18 AM By: Donnamarie Poag Previous Signature: 10/30/2020 3:19:14 PM Version By: Donnamarie Poag Entered By: Donnamarie Poag on 10/30/2020 15:56:42 Folker, Nyashia L. ([id number]) -------------------------------------------------------------------------------- Vitals Details Patient Name: Baskett, Elga L. Date of Service: 10/30/2020 12:30 PM Medical Record Number: [id number] Patient Account Number: [id number] Date of Birth/Sex: Jul 10, 1926 (87 y.o. F) Treating RN: Donnamarie Poag Primary Care Michail Boyte: [name] Other Clinician: Referring Hyla Coard: [name] Treating Brantley Naser/Extender: Skipper Cliche in Treatment: 18 Vital Signs Time Taken: 12:43 Temperature (F): 98.1 Height (in): 58 Pulse (bpm): 76 Weight (lbs): 137 Respiratory Rate (breaths/min): 16 Body Mass Index (BMI): 28.6 Blood Pressure (mmHg): 174/80 Reference Range: 80 - 120 mg / dl Electronic Signature(s) Signed: 10/30/2020 3:19:14 PM By: Donnamarie Poag Entered ByDonnamarie Poag on 10/30/2020 12:44:26

## 2020-11-06 ENCOUNTER — Encounter: Payer: PPO | Admitting: Physician Assistant

## 2020-11-06 ENCOUNTER — Other Ambulatory Visit: Payer: Self-pay

## 2020-11-06 DIAGNOSIS — L89623 Pressure ulcer of left heel, stage 3: Secondary | ICD-10-CM | POA: Diagnosis not present

## 2020-11-06 DIAGNOSIS — A499 Bacterial infection, unspecified: Secondary | ICD-10-CM | POA: Diagnosis not present

## 2020-11-06 DIAGNOSIS — L89893 Pressure ulcer of other site, stage 3: Secondary | ICD-10-CM | POA: Diagnosis not present

## 2020-11-06 NOTE — Progress Notes (Addendum)
Goldberg, Kalynn L. ([id number]) Visit Report for 11/06/2020 Chief Complaint Document Details Patient Name: [name] [name], [name] Date of Service: 11/06/2020 9:30 AM Medical Record Number: [id number] Patient Account Number: [id number] Date of Birth/Sex: Nov 13, 1926 (87 y.o. F) Treating RN: Donnamarie Poag Primary Care Provider: [name] Other Clinician: Referring Provider: [name] Treating Provider/Extender: Skipper Cliche in Treatment: 19 Information Obtained from: Patient Chief Complaint Left Heel Pressure Ulcer Electronic Signature(s) Signed: 11/06/2020 9:49:42 AM By: Worthy Keeler PA-C Entered By: Worthy Keeler on 11/06/2020 09:49:42 Orrego, Freyja L. ([id number]) -------------------------------------------------------------------------------- Debridement Details Patient Name: [name], [name] Date of Service: 11/06/2020 9:30 AM Medical Record Number: [id number] Patient Account Number: [id number] Date of Birth/Sex: [birth year] (87 y.o. F) Treating RN: Donnamarie Poag Primary Care Provider: [name] Other Clinician: Referring Provider: [name] Treating Provider/Extender: Skipper Cliche in Treatment: 19 Debridement Performed for Wound #3 Left Foot Assessment: Performed By: Physician Tommie Sams., PA-C Debridement Type: Debridement Level of Consciousness (Pre- Awake and Alert procedure): Pre-procedure Verification/Time Out Yes - 10:06 Taken: Start Time: 10:06 Pain Control: Lidocaine Total Area Debrided (L x W): 0.4 (cm) x 0.4 (cm) = 0.16 (cm) Tissue and other material Viable, Non-Viable, Slough, Subcutaneous, Slough debrided: Level: Skin/Subcutaneous Tissue Debridement Description: Excisional Instrument: Curette Bleeding: Minimum Hemostasis Achieved: Pressure End Time: 10:09 Response to Treatment: Procedure was tolerated well Level of Consciousness (Post- Awake and Alert procedure): Post Debridement Measurements of Total Wound Length: (cm)  0.4 Stage: Category/Stage III Width: (cm) 0.4 Depth: (cm) 0.4 Volume: (cm) 0.05 Character of Wound/Ulcer Post Debridement: Improved Post Procedure Diagnosis Same as Pre-procedure Electronic Signature(s) Signed: 11/06/2020 4:22:54 PM By: Donnamarie Poag Signed: 11/06/2020 6:22:10 PM By: Worthy Keeler PA-C Entered By: Donnamarie Poag on 11/06/2020 10:08:25 Coutts, [place] ([id number]) -------------------------------------------------------------------------------- HPI Details Patient Name: [name], [name] Date of Service: 11/06/2020 9:30 AM Medical Record Number: [id number] Patient Account Number: [id number] Date of Birth/Sex: 06-01-1926 (87 y.o. F) Treating RN: Donnamarie Poag Primary Care Provider: [name] Other Clinician: Referring Provider: [name] Treating Provider/Extender: Skipper Cliche in Treatment: 19 History of Present Illness HPI Description: 87 year old patient was recently seen by the PCPs office for significant pain right great toe which has been going on since July. She was initially treated with Keflex which she did not complete and after the office visit this time she has been put on doxycycline. at the time of her visit she was found to have a ulcer on the plantar surface of the right great toe and also had a pyogenic granuloma over this area. X-ray of the right foot done 12/29/2014 -- IMPRESSION:Soft-tissue swelling and ulceration right great toe. No underlying bony lytic lesion identified. If osteomyelitis remains of clinical concern MRI can be obtained. Past medical history significant for anemia, chronic kidney disease stage III, obesity, varicose veins, coronary artery disease, gout, history of nicotine addiction given up smoking in 2002, hypertension, status post cardiac catheterization, status post abdominal hysterectomy, cholecystectomy and tonsillectomy. hemoglobin A1c done in August was 5.8 01/22/2015 -- at this stage the [facility] Walking boat was going  to cost them significant amount of money and they want to defer using that at the present time. 01/29/2015 -- she had a podiatry appointment and they have trimmed her toenails. She has not heard back from the vascular office regarding her venous duplex study and I have asked them to call personally so that they can get the appointment soon. 02/06/2015 -- they have made contact with the vascular office and from  what I understand that test has been done but the report is pending. 02/19/2014 -- the vascular test is scheduled for tomorrow Readmission: 06/26/2020 upon evaluation today patient appears for initial evaluation in her clinic that she has been here before in 2016 and has been quite sometime. She did have a fractured hip in February on the 23rd 2022. Subsequently this had to be pinned and she ended up with a pressure injury on her heel following when she was using her foot to help move her around in the bed. Subsequently this has led to the wound that she has been dealing with since that time. She lives at home with her daughter currently she does have dementia. 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 07/03/2020 upon evaluation today patient appears to be doing well with regard to her heel ulcer. She did have arterial studies they appear to be doing excellent it was premature normal across the board with TBI's in the 90s and ABIs well within normal range. Nonetheless there does not appear to be any signs of arterial insufficiency whatsoever. With that being said the patient does have also signs of improvement there is some necrotic tissue in the base of the wound number to try to clear some of that away today I do believe the Iodoflex/Iodosorb is doing well. 5/24; difficult punched out wound on the left medial heel. She has been using Iodoflex 07/17/2020 upon  evaluation today patient appears to be doing well at this point in regard to her wound. I do feel like this is a little bit deeper but again that is what is expected as we continue with the Iodoflex I think that this is just going to get deeper until we get to the base of the wound. With that being said I think we are getting much closer to the base of the wound where we can have healthier tissue that we get a be managing here which that will be awesome. In the meantime I am not surprised by what I am seeing and in fact the wound appears to be better as compared to previous findings. 07/24/2020 upon evaluation today patient appears to be doing well with regard to her wound. Overall I am extremely pleased with where things stand today. I do not see any signs of active infection which is great and overall I think that the patient is making good progress. There is good to be some need for sharp debridement today. 07/31/2020 upon evaluation today patient appears to be doing better in regard to her heel ulcer. She has been tolerating the dressing changes without complication. Fortunately there does not appear to be any signs of active infection which is great and overall very pleased in this regard. No fevers, chills, nausea, vomiting, or diarrhea. 08/14/2020 upon evaluation today patient appears to be doing better in regard to her heel ulcer. I am very pleased in that regard. Unfortunately she still has a slight deep tissue injury in regard to the medial portion of her foot over the same area. Unfortunately I think this is something that if were not careful it was can open up into the wound. That is my main concern here based on what I see. Fortunately there does not appear to be any evidence of active infection which is great news and overall very pleased with where things stand at this point. 08/21/2020 upon evaluation  today patient appears to be doing well with regard to her wound. She has been tolerating  the dressing changes without complication. Fortunately there does not appear to be any signs of active infection at this time. No fevers, chills, nausea, vomiting, or diarrhea. I do believe the compression wrap was beneficial for her. 08/28/2020 upon evaluation today patient appears to be doing well with regard to her wounds currently. Fortunately there does not appear to be any signs of active infection at this time. No fevers, chills, nausea, vomiting, or diarrhea. With that being said I think that her leg is doing quite well to be honest. 09/04/2020 upon evaluation today patient appears to be doing well with regard to her wound on the heel. This is showing signs of good epithelial growth although there is probably can it be an indention where this heals that is okay as long as we get it closed. Fortunately I do not see any evidence of infection at this point. 09/13/2020 upon evaluation today patient appears to be doing well with regard to her wounds. She has been tolerating the dressing changes Bebee, Mayla L. ([id number]) without complication. Fortunately he is actually doing extremely well and I think she is making great progress this is measuring smaller. I would recommend that such that we continue with the [facility] likely since he is doing so well. 09/18/2020 upon evaluation today patient appears to be doing well with regard to her heel ulcer. She is making good progress and I am very pleased with what we see today. I think the Hydrofera Blue is still doing excellent. 10/02/2020 upon evaluation today patient's wound actually appears to be showing signs of good improvement in regard to the heel. I am very pleased in this regard. With that being said I do believe that the area which is somewhat been stable and dry is starting to lift up and beginning to clear this away as well that is on the foot. Subsequently I am going to have to perform some debridement here and we did actually go ahead  and allow this as a wound today as before it was just more deep tissue injury and eschar covering now I think it is a little bit more than that to be honest. 10/09/2020 upon evaluation today patient appears to be doing decently well in regard to her wounds. I am actually very pleased with where things stand today. There does not appear to be any signs of active infection which is great news. No fevers, chills, nausea, vomiting, or diarrhea. She is going require some sharp debridement today. 10/16/2020 upon evaluation today patient appears to be doing decently well in regard to her heel as well as the foot. Both are showing signs of good improvement which is great news. In general and extremely pleased with where things stand at this point. She is tolerating the [facility] well although this is getting so small in the heel I think collagen may be a better option here based on what I am seeing. With regard to the foot the Iodoflex/Iodosorb is still probably the best method here. 10/26/2020 upon evaluation today patient actually appears to be doing well in some respects today. She has been tolerating the dressing changes without complication and this is great news. The Hydrofera Blue is done well for the heel this actually appears to be healed today. With that being said in regard to the foot I think we are ready to switch to [facility] based on what I  see at this point. 10/30/2020 upon evaluation today patient appears to be doing well with regard to his wound. He has been tolerating the dressing changes without complication. Fortunately there does not appear to be any signs of active infection systemically at this time which is great news and overall very pleased with where things stand today. I do think that she is making progress although it somewhat slow still where showing signs of improvement little by little here. 11/06/2020 upon evaluation today patient appears to be doing decently well in  regard to her wound. We will start and see better overall appearance of the base of the wound which is great news she still continues to have some abnormal fluorescence signals with a MolecuLight DX that will be detailed below. Electronic Signature(s) Signed: 11/06/2020 10:14:46 AM By: Worthy Keeler PA-C Entered By: Worthy Keeler on 11/06/2020 10:14:45 Yoshida, Maurina L. ([id number]) -------------------------------------------------------------------------------- Physical Exam Details Patient Name: Lingard, Shanequia L. Date of Service: 11/06/2020 9:30 AM Medical Record Number: [id number] Patient Account Number: [id number] Date of Birth/Sex: 04-11-1926 (87 y.o. F) Treating RN: Donnamarie Poag Primary Care Provider: [name] Other Clinician: Referring Provider: [name] Treating Provider/Extender: Skipper Cliche in Treatment: 64 Constitutional Well-nourished and well-hydrated in no acute distress. Respiratory normal breathing without difficulty. Psychiatric this patient is able to make decisions and demonstrates good insight into disease process. Alert and Oriented x 3. pleasant and cooperative. Notes Upon inspection patient's wound bed actually showed signs of good granulation epithelization at this point in a lot of areas. There is still some slough noted in the base of the wound I would have to remove this today and the patient tolerated that debridement today without complication. Post debridement the wound bed appears to be doing better the good news is her pain was really not too significant at all at this time which is also great news. Fluctuance imaging did show blush and red signals noted indicating a higher bacterial burden. Subsequently I did perform debridement to clear some of this away and post imaging did appear to be doing better. Electronic Signature(s) Signed: 11/06/2020 10:15:36 AM By: Worthy Keeler PA-C Entered By: Worthy Keeler on 11/06/2020  10:15:35 Alcorn, Aine L. ([id number]) -------------------------------------------------------------------------------- Physician Orders Details Patient Name: Scaglione, Artha L. Date of Service: 11/06/2020 9:30 AM Medical Record Number: [id number] Patient Account Number: [id number] Date of Birth/Sex: 13-Dec-1926 (87 y.o. F) Treating RN: Donnamarie Poag Primary Care Provider: [name] Other Clinician: Referring Provider: [name] Treating Provider/Extender: Skipper Cliche in Treatment: 39 Verbal / Phone Orders: No Diagnosis Coding ICD-10 Coding Code Description [phone] Pressure ulcer of left heel, stage 3 L89.893 Pressure ulcer of other site, stage 3 I10 Essential (primary) hypertension F01.50 Vascular dementia without behavioral disturbance I25.10 Atherosclerotic heart disease of native coronary artery without angina pectoris Follow-up Appointments o Return Appointment in 1 week. - nurse visit 9/27 for wrap o Nurse Visit as needed Bathing/ Shower/ Hygiene o May shower with wound dressing protected with water repellent cover or cast protector. o No tub bath. Edema Control - Lymphedema / Segmental Compressive Device / Other o Patient to wear own compression stockings. Remove compression stockings every night before going to bed and put on every morning when getting up. - right leg o Elevate legs to the level of the heart and pump ankles as often as possible o Elevate leg(s) parallel to the floor when sitting. o DO YOUR BEST to sleep in the bed at night. DO NOT sleep in  your recliner. Long hours of sitting in a recliner leads to swelling of the legs and/or potential wounds on your backside. Off-Loading o Other: - Offloading boots while in bed; Try pillow under sheet to keep it in place. Wound Treatment Wound #3 - Foot Wound Laterality: Left Cleanser: Soap and Water 1 x Per Week/30 Days Discharge Instructions: Gently cleanse wound with antibacterial soap,  rinse and pat dry prior to dressing wounds Primary Dressing: Hydrofera Blue Ready Transfer Foam, 2.5x2.5 (in/in) 1 x Per Week/30 Days Discharge Instructions: Apply Hydrofera Blue Ready to wound bed as directed Secondary Dressing: ABD Pad 5x9 (in/in) 1 x Per Week/30 Days Discharge Instructions: Cover with ABD pad Compression Wrap: Profore Lite LF 3 Multilayer Compression Bandaging System 1 x Per Week/30 Days Discharge Instructions: Apply 3 multi-layer wrap as prescribed. Electronic Signature(s) Signed: 11/06/2020 4:22:54 PM By: Donnamarie Poag Signed: 11/06/2020 6:22:10 PM By: Worthy Keeler PA-C Entered By: Donnamarie Poag on 11/06/2020 10:09:11 Greenly, Chiyo L. ([id number]) -------------------------------------------------------------------------------- Problem List Details Patient Name: Beaulac, Marva L. Date of Service: 11/06/2020 9:30 AM Medical Record Number: [id number] Patient Account Number: [id number] Date of Birth/Sex: 1926/02/23 (87 y.o. F) Treating RN: Donnamarie Poag Primary Care Provider: [name] Other Clinician: Referring Provider: [name] Treating Provider/Extender: Skipper Cliche in Treatment: 19 Active Problems ICD-10 Encounter Code Description Active Date MDM Diagnosis [phone] Pressure ulcer of left heel, stage 3 06/26/2020 No Yes L89.893 Pressure ulcer of other site, stage 3 10/02/2020 No Yes I10 Essential (primary) hypertension 06/26/2020 No Yes F01.50 Vascular dementia without behavioral disturbance 06/26/2020 No Yes I25.10 Atherosclerotic heart disease of native coronary artery without angina 06/26/2020 No Yes pectoris Inactive Problems Resolved Problems Electronic Signature(s) Signed: 11/06/2020 9:47:04 AM By: Worthy Keeler PA-C Entered By: Worthy Keeler on 11/06/2020 09:47:04 Recore, Leylani L. ([id number]) -------------------------------------------------------------------------------- Progress Note Details Patient Name: Kallen, Annelise L. Date of  Service: 11/06/2020 9:30 AM Medical Record Number: [id number] Patient Account Number: [id number] Date of Birth/Sex: 1927/02/11 (87 y.o. F) Treating RN: Donnamarie Poag Primary Care Provider: [name] Other Clinician: Referring Provider: [name] Treating Provider/Extender: Skipper Cliche in Treatment: 19 Subjective Chief Complaint Information obtained from Patient Left Heel Pressure Ulcer History of Present Illness (HPI) 87 year old patient was recently seen by the PCPs office for significant pain right great toe which has been going on since July. She was initially treated with Keflex which she did not complete and after the office visit this time she has been put on doxycycline. at the time of her visit she was found to have a ulcer on the plantar surface of the right great toe and also had a pyogenic granuloma over this area. X-ray of the right foot done 12/29/2014 -- IMPRESSION:Soft-tissue swelling and ulceration right great toe. No underlying bony lytic lesion identified. If osteomyelitis remains of clinical concern MRI can be obtained. Past medical history significant for anemia, chronic kidney disease stage III, obesity, varicose veins, coronary artery disease, gout, history of nicotine addiction given up smoking in 2002, hypertension, status post cardiac catheterization, status post abdominal hysterectomy, cholecystectomy and tonsillectomy. hemoglobin A1c done in August was 5.8 01/22/2015 -- at this stage the [facility] Walking boat was going to cost them significant amount of money and they want to defer using that at the present time. 01/29/2015 -- she had a podiatry appointment and they have trimmed her toenails. She has not heard back from the vascular office regarding her venous duplex study and I have asked them to call personally so that  they can get the appointment soon. 02/06/2015 -- they have made contact with the vascular office and from what I understand that test has  been done but the report is pending. 02/19/2014 -- the vascular test is scheduled for tomorrow Readmission: 06/26/2020 upon evaluation today patient appears for initial evaluation in her clinic that she has been here before in 2016 and has been quite sometime. She did have a fractured hip in February on the 23rd 2022. Subsequently this had to be pinned and she ended up with a pressure injury on her heel following when she was using her foot to help move her around in the bed. Subsequently this has led to the wound that she has been dealing with since that time. She lives at home with her daughter currently she does have dementia. 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 07/03/2020 upon evaluation today patient appears to be doing well with regard to her heel ulcer. She did have arterial studies they appear to be doing excellent it was premature normal across the board with TBI's in the 90s and ABIs well within normal range. Nonetheless there does not appear to be any signs of arterial insufficiency whatsoever. With that being said the patient does have also signs of improvement there is some necrotic tissue in the base of the wound number to try to clear some of that away today I do believe the Iodoflex/Iodosorb is doing well. 5/24; difficult punched out wound on the left medial heel. She has been using Iodoflex 07/17/2020 upon evaluation today patient appears to be doing well at this point in regard to her wound. I do feel like this is a little bit deeper but again that is what is expected as we continue with the Iodoflex I think that this is just going to get deeper until we get to the base of the wound. With that being said I think we are getting much closer to the base of the wound where we can have healthier tissue that we get a be managing here which that will be  awesome. In the meantime I am not surprised by what I am seeing and in fact the wound appears to be better as compared to previous findings. 07/24/2020 upon evaluation today patient appears to be doing well with regard to her wound. Overall I am extremely pleased with where things stand today. I do not see any signs of active infection which is great and overall I think that the patient is making good progress. There is good to be some need for sharp debridement today. 07/31/2020 upon evaluation today patient appears to be doing better in regard to her heel ulcer. She has been tolerating the dressing changes without complication. Fortunately there does not appear to be any signs of active infection which is great and overall very pleased in this regard. No fevers, chills, nausea, vomiting, or diarrhea. 08/14/2020 upon evaluation today patient appears to be doing better in regard to her heel ulcer. I am very pleased in that regard. Unfortunately she still has a slight deep tissue injury in regard to the medial portion of her foot over the same area. Unfortunately I think this is something that if were not careful it was can open up into the wound. That is my main concern here based on what I see. Fortunately there does not appear to be any evidence of active  infection which is great news and overall very pleased with where things stand at this point. 08/21/2020 upon evaluation today patient appears to be doing well with regard to her wound. She has been tolerating the dressing changes without complication. Fortunately there does not appear to be any signs of active infection at this time. No fevers, chills, nausea, vomiting, or diarrhea. I do believe the compression wrap was beneficial for her. 08/28/2020 upon evaluation today patient appears to be doing well with regard to her wounds currently. Fortunately there does not appear to be any signs of active infection at this time. No fevers, chills, nausea,  vomiting, or diarrhea. With that being said I think that her leg is doing quite well to be honest. Flurry, Ina L. ([id number]) 09/04/2020 upon evaluation today patient appears to be doing well with regard to her wound on the heel. This is showing signs of good epithelial growth although there is probably can it be an indention where this heals that is okay as long as we get it closed. Fortunately I do not see any evidence of infection at this point. 09/13/2020 upon evaluation today patient appears to be doing well with regard to her wounds. She has been tolerating the dressing changes without complication. Fortunately he is actually doing extremely well and I think she is making great progress this is measuring smaller. I would recommend that such that we continue with the [facility] likely since he is doing so well. 09/18/2020 upon evaluation today patient appears to be doing well with regard to her heel ulcer. She is making good progress and I am very pleased with what we see today. I think the Hydrofera Blue is still doing excellent. 10/02/2020 upon evaluation today patient's wound actually appears to be showing signs of good improvement in regard to the heel. I am very pleased in this regard. With that being said I do believe that the area which is somewhat been stable and dry is starting to lift up and beginning to clear this away as well that is on the foot. Subsequently I am going to have to perform some debridement here and we did actually go ahead and allow this as a wound today as before it was just more deep tissue injury and eschar covering now I think it is a little bit more than that to be honest. 10/09/2020 upon evaluation today patient appears to be doing decently well in regard to her wounds. I am actually very pleased with where things stand today. There does not appear to be any signs of active infection which is great news. No fevers, chills, nausea, vomiting, or diarrhea. She  is going require some sharp debridement today. 10/16/2020 upon evaluation today patient appears to be doing decently well in regard to her heel as well as the foot. Both are showing signs of good improvement which is great news. In general and extremely pleased with where things stand at this point. She is tolerating the [facility] well although this is getting so small in the heel I think collagen may be a better option here based on what I am seeing. With regard to the foot the Iodoflex/Iodosorb is still probably the best method here. 10/26/2020 upon evaluation today patient actually appears to be doing well in some respects today. She has been tolerating the dressing changes without complication and this is great news. The Hydrofera Blue is done well for the heel this actually appears to be healed today. With that being said  in regard to the foot I think we are ready to switch to [facility] based on what I see at this point. 10/30/2020 upon evaluation today patient appears to be doing well with regard to his wound. He has been tolerating the dressing changes without complication. Fortunately there does not appear to be any signs of active infection systemically at this time which is great news and overall very pleased with where things stand today. I do think that she is making progress although it somewhat slow still where showing signs of improvement little by little here. 11/06/2020 upon evaluation today patient appears to be doing decently well in regard to her wound. We will start and see better overall appearance of the base of the wound which is great news she still continues to have some abnormal fluorescence signals with a MolecuLight DX that will be detailed below. Objective Constitutional Well-nourished and well-hydrated in no acute distress. Vitals Time Taken: 9:34 AM, Height: 58 in, Weight: 137 lbs, BMI: 28.6, Temperature: 98.5 F, Pulse: 83 bpm, Respiratory Rate: 18  breaths/min, Blood Pressure: 149/18 mmHg. Respiratory normal breathing without difficulty. Psychiatric this patient is able to make decisions and demonstrates good insight into disease process. Alert and Oriented x 3. pleasant and cooperative. General Notes: Upon inspection patient's wound bed actually showed signs of good granulation epithelization at this point in a lot of areas. There is still some slough noted in the base of the wound I would have to remove this today and the patient tolerated that debridement today without complication. Post debridement the wound bed appears to be doing better the good news is her pain was really not too significant at all at this time which is also great news. Fluctuance imaging did show blush and red signals noted indicating a higher bacterial burden. Subsequently I did perform debridement to clear some of this away and post imaging did appear to be doing better. Integumentary (Hair, Skin) Wound #3 status is Open. Original cause of wound was Pressure Injury. The date acquired was: 10/02/2020. The wound has been in treatment 5 weeks. The wound is located on the Left Foot. The wound measures 0.4cm length x 0.4cm width x 0.3cm depth; 0.126cm^2 area and 0.038cm^3 volume. There is Fat Layer (Subcutaneous Tissue) exposed. There is no tunneling or undermining noted. There is a medium amount of serosanguineous drainage noted. The wound margin is thickened. There is large (67-100%) red, pink granulation within the wound bed. There is a small (1-33%) amount of necrotic tissue within the wound bed including Adherent Slough. Garverick, Charmain L. ([id number]) Assessment Active Problems ICD-10 Pressure ulcer of left heel, stage 3 Pressure ulcer of other site, stage 3 Essential (primary) hypertension Vascular dementia without behavioral disturbance Atherosclerotic heart disease of native coronary artery without angina pectoris Procedures Wound #3 Pre-procedure  diagnosis of Wound #3 is a Pressure Ulcer located on the Left Foot . There was a Excisional Skin/Subcutaneous Tissue Debridement with a total area of 0.16 sq cm performed by Tommie Sams., PA-C. With the following instrument(s): Curette to remove Viable and Non-Viable tissue/material. Material removed includes Subcutaneous Tissue and Slough and after achieving pain control using Lidocaine. A time out was conducted at 10:06, prior to the start of the procedure. A Minimum amount of bleeding was controlled with Pressure. The procedure was tolerated well. Post Debridement Measurements: 0.4cm length x 0.4cm width x 0.4cm depth; 0.05cm^3 volume. Post debridement Stage noted as Category/Stage III. Character of Wound/Ulcer Post Debridement is improved. Post procedure Diagnosis  Wound #3: Same as Pre-Procedure Plan Follow-up Appointments: Return Appointment in 1 week. - nurse visit 9/27 for wrap Nurse Visit as needed Bathing/ Shower/ Hygiene: May shower with wound dressing protected with water repellent cover or cast protector. No tub bath. Edema Control - Lymphedema / Segmental Compressive Device / Other: Patient to wear own compression stockings. Remove compression stockings every night before going to bed and put on every morning when getting up. - right leg Elevate legs to the level of the heart and pump ankles as often as possible Elevate leg(s) parallel to the floor when sitting. DO YOUR BEST to sleep in the bed at night. DO NOT sleep in your recliner. Long hours of sitting in a recliner leads to swelling of the legs and/or potential wounds on your backside. Off-Loading: Other: - Offloading boots while in bed; Try pillow under sheet to keep it in place. WOUND #3: - Foot Wound Laterality: Left Cleanser: Soap and Water 1 x Per Week/30 Days Discharge Instructions: Gently cleanse wound with antibacterial soap, rinse and pat dry prior to dressing wounds Primary Dressing: Hydrofera Blue Ready  Transfer Foam, 2.5x2.5 (in/in) 1 x Per Week/30 Days Discharge Instructions: Apply Hydrofera Blue Ready to wound bed as directed Secondary Dressing: ABD Pad 5x9 (in/in) 1 x Per Week/30 Days Discharge Instructions: Cover with ABD pad Compression Wrap: Profore Lite LF 3 Multilayer Compression Bandaging System 1 x Per Week/30 Days Discharge Instructions: Apply 3 multi-layer wrap as prescribed. 1. Would recommend currently that we going continue with the wound care measures as before with regard to the [facility] I think this is still good antimicrobial dressing. 2. I am also can recommend that we have the patient continue with the compression wrapping. We will be using a 3 layer compression wrap which I think is still a good option here. 3. I am also can recommend that we have the patient continue to elevate her legs much as possible I think that still good to be an appropriate thing considering what we are seeing currently. We will see patient back for reevaluation in 1 week here in the clinic. If anything worsens or changes patient will contact our office for additional recommendations. This will be a nurse visit and I will see the patient in 2 weeks for a visit with me. Amenta, Marshawn L. ([id number]) MolecuLight DX: 1st Scanned Wound The following wound was scanned with MolecuLight DX): Left medial foot ulcer Fluorescence bacterial imaging was medically necessary today due to Prior visit fluorescence image demonstrated a high bioburden load (Indication): monitoring impact of prescribe tx MolecuLight Results Red Colors, Blush Colors, Green Colors The indicated colors were noted in the following area(s). In the periphery of the wound As a result of todayos scan, the following treatment plans were put in Targeted debridement, continue Hydrofera Blue, cleanse with Dakin's place. solution MolecuLight Procedure The MolecularLight DX device was cleaned with a disinfectant wipe prior to use., The  correct patient profile was confirmed and correct wound was verified., Range finder sensor used to ensure appropriate distance The following was completed: selected between imaging unit and wound bed, Room lights were turned off and the ambient light sensor was checked., Blue circle appeared around the lightbulb., The fluorescence icon was selected. Screen was tapped to enhance focus and the image was captured. Additional drapes were used to ensure adequate darknesso No Potential ICD-10 Codes ICD-10 A49.9 Bacterial infection unspecified (Red/Blush/Yellow Color) Additional Scanned Wounds Did you scan any additional Woundso No Electronic Signature(s) Signed: 11/06/2020  10:17:11 AM By: Worthy Keeler PA-C Entered By: Worthy Keeler on 11/06/2020 10:17:10 Stonehocker, Nichoel L. ([id number]) -------------------------------------------------------------------------------- SuperBill Details Patient Name: Phegley, Paizley L. Date of Service: 11/06/2020 Medical Record Number: [id number] Patient Account Number: [id number] Date of Birth/Sex: [birth year] (87 y.o. F) Treating RN: Donnamarie Poag Primary Care Provider: [name] Other Clinician: Referring Provider: [name] Treating Provider/Extender: Skipper Cliche in Treatment: 19 Diagnosis Coding ICD-10 Codes Code Description [phone] Pressure ulcer of left heel, stage 3 L89.893 Pressure ulcer of other site, stage 3 I10 Essential (primary) hypertension F01.50 Vascular dementia without behavioral disturbance I25.10 Atherosclerotic heart disease of native coronary artery without angina pectoris Facility Procedures CPT4 Code: [id number] Description: [id number] - DEB SUBQ TISSUE 20 SQ CM/< Modifier: Quantity: 1 CPT4 Code: Description: ICD-10 Diagnosis Description L89.893 Pressure ulcer of other site, stage 3 Modifier: Quantity: CPT4 Code: [id number] Description: NONCNTACT RT FLORO WND 1ST STE (CDM [id number]) Modifier: Quantity: 1 CPT4  Code: Description: ICD-10 Diagnosis Description L89.893 Pressure ulcer of other site, stage 3 Modifier: Quantity: Physician Procedures CPT4 [id number] Description: 11042 - WC PHYS SUBQ TISS 20 SQ CM Modifier: Quantity: 1 CPT4 Code: Description: ICD-10 Diagnosis Description L89.893 Pressure ulcer of other site, stage 3 Modifier: Quantity: CPT4 Code: [id number] Description: HC MOLECULIGHT WND IMG 1ST ANATOMIC SITE Modifier: Quantity: 1 CPT4 Code: Description: ICD-10 Diagnosis Description L89.893 Pressure ulcer of other site, stage 3 Modifier: Quantity: Electronic Signature(s) Signed: 11/06/2020 10:17:47 AM By: Worthy Keeler PA-C Entered By: Worthy Keeler on 11/06/2020 10:17:47

## 2020-11-06 NOTE — Progress Notes (Signed)
Stauder, Wing L. ([id number]) Visit Report for 11/06/2020 Arrival Information Details Patient Name: [name] [name], [name] Date of Service: 11/06/2020 9:30 AM Medical Record Number: [id number] Patient Account Number: [id number] Date of Birth/Sex: 07-Jul-1926 (87 y.o. F) Treating RN: Donnamarie Poag Primary Care Kaelin Bonelli: [name] Other Clinician: Referring Amarri Michaelson: [name] Treating Keriana Sarsfield/Extender: Skipper Cliche in Treatment: 59 Visit Information History Since Last Visit Added or deleted any medications: No Patient Arrived: Wheel Chair Had a fall or experienced change in No Arrival Time: 09:32 activities of daily living that may affect Accompanied By: daughter risk of falls: Transfer Assistance: EasyPivot Patient Lift Hospitalized since last visit: No Patient Identification Verified: Yes Has Dressing in Place as Prescribed: Yes Secondary Verification Process Completed: Yes Has Compression in Place as Prescribed: Yes Patient Requires Transmission-Based No Pain Present Now: No Precautions: Patient Has Alerts: Yes Patient Alerts: NOT diabetic ABI R1.18 L1.25 06/27/20 Electronic Signature(s) Signed: 11/06/2020 4:22:54 PM By: Donnamarie Poag Entered ByDonnamarie Poag on 11/06/2020 09:35:11 Heyde, Airis L. ([id number]) -------------------------------------------------------------------------------- Clinic Level of Care Assessment Details Patient Name: [name] [name], [name] Date of Service: 11/06/2020 9:30 AM Medical Record Number: [id number] Patient Account Number: [id number] Date of Birth/Sex: 07/03/1926 (87 y.o. F) Treating RN: Donnamarie Poag Primary Care Ward Boissonneault: [name] Other Clinician: Referring Dijon Cosens: [name] Treating Atiya Yera/Extender: Skipper Cliche in Treatment: 19 Clinic Level of Care Assessment Items TOOL 1 Quantity Score _0  - Use when EandM and Procedure is performed on INITIAL visit 0 ASSESSMENTS - Nursing Assessment / Reassessment _1  - General  Physical Exam (combine w/ comprehensive assessment (listed just below) when performed on new 0 pt. evals) _2  - 0 Comprehensive Assessment (HX, ROS, Risk Assessments, Wounds Hx, etc.) ASSESSMENTS - Wound and Skin Assessment / Reassessment _3  - Dermatologic / Skin Assessment (not related to wound area) 0 ASSESSMENTS - Ostomy and/or Continence Assessment and Care _4  - Incontinence Assessment and Management 0 _5  - 0 Ostomy Care Assessment and Management (repouching, etc.) PROCESS - Coordination of Care _6  - Simple Patient / Family Education for ongoing care 0 _7  - 0 Complex (extensive) Patient / Family Education for ongoing care _8  - 0 Staff obtains Programmer, systems, Records, Test Results / Process Orders _9  - 0 Staff telephones HHA, Nursing Homes / Clarify orders / etc _10  - 0 Routine Transfer to another Facility (non-emergent condition) _11  - 0 Routine Hospital Admission (non-emergent condition) _12  - 0 New Admissions / Biomedical engineer / Ordering NPWT, Apligraf, etc. _13  - 0 Emergency Hospital Admission (emergent condition) PROCESS - Special Needs _14  - Pediatric / Minor Patient Management 0 _15  - 0 Isolation Patient Management _16  - 0 Hearing / Language / Visual special needs _17  - 0 Assessment of Community assistance (transportation, D/C planning, etc.) _18  - 0 Additional assistance / Altered mentation _19  - 0 Support Surface(s) Assessment (bed, cushion, seat, etc.) INTERVENTIONS - Miscellaneous _20  - External ear exam 0 _21  - 0 Patient Transfer (multiple staff / Civil Service fast streamer / Similar devices) _22  - 0 Simple Staple / Suture removal (25 or less) _23  - 0 Complex Staple / Suture removal (26 or more) _24  - 0 Hypo/Hyperglycemic Management (do not check if billed separately) _25  - 0 Ankle / Brachial Index (ABI) - do not check if billed separately Has the patient been seen at the hospital within the last three years: Yes Total Score: 0 Level Of Care: ____ [name] [name], [name]  ([id number]) Electronic Signature(s) Signed: 11/06/2020 4:22:54 PM By: Donnamarie Poag Entered By: Donnamarie Poag on 11/06/2020 10:10:20 Vinzant, Granite (  [id number]) -------------------------------------------------------------------------------- Encounter Discharge Information Details Patient Name: [name] [name], [name] Date of Service: 11/06/2020 9:30 AM Medical Record Number: [id number] Patient Account Number: [id number] Date of Birth/Sex: [birth year] (87 y.o. F) Treating RN: Donnamarie Poag Primary Care Clancy Leiner: [name] Other Clinician: Referring Lander [name]: [name] Treating Trenyce Loera/Extender: Skipper Cliche in Treatment: 19 Encounter Discharge Information Items Post Procedure Vitals Discharge Condition: Stable Temperature (F): 98.5 Ambulatory Status: Wheelchair Pulse (bpm): 83 Discharge Destination: Home Respiratory Rate (breaths/min): 18 Transportation: Private Auto Blood Pressure (mmHg): 149/83 Accompanied By: self Schedule Follow-up Appointment: Yes Clinical Summary of Care: Electronic Signature(s) Signed: 11/06/2020 4:22:54 PM By: Donnamarie Poag Entered ByDonnamarie Poag on 11/06/2020 10:24:39 Wilczak, Angel L. ([id number]) -------------------------------------------------------------------------------- Lower Extremity Assessment Details Patient Name: [name] [name], [name] Date of Service: 11/06/2020 9:30 AM Medical Record Number: [id number] Patient Account Number: [id number] Date of Birth/Sex: [birth year] (87 y.o. F) Treating RN: Donnamarie Poag Primary Care Zaineb Nowaczyk: [name] Other Clinician: Referring Arpi Diebold: [name] Treating Jourdyn Ferrin/Extender: Skipper Cliche in Treatment: 19 Edema Assessment Assessed: [Left: Yes] Patrice Paradise: No] [Left: Edema] [Right: :] Calf Left: Right: Point of Measurement: 32 cm From Medial Instep 32 cm Ankle Left: Right: Point of Measurement: 12 cm From Medial Instep 21.5 cm Knee To Floor Left: Right: From Medial Instep 40  cm Vascular Assessment Pulses: Dorsalis Pedis Palpable: [Left:Yes] Electronic Signature(s) Signed: 11/06/2020 4:22:54 PM By: Donnamarie Poag Entered ByDonnamarie Poag on 11/06/2020 09:41:06 Welborn, Perla ([id number]) -------------------------------------------------------------------------------- Multi Wound Chart Details Patient Name: [name] [name], [name] Date of Service: 11/06/2020 9:30 AM Medical Record Number: [id number] Patient Account Number: [id number] Date of Birth/Sex: August 03, 1926 (87 y.o. F) Treating RN: Donnamarie Poag Primary Care Eulalio Reamy: [name] Other Clinician: Referring Rosemae Mcquown: [name] Treating Sharnee Douglass/Extender: Skipper Cliche in Treatment: 19 Vital Signs Height(in): 18 Pulse(bpm): 51 Weight(lbs): 137 Blood Pressure(mmHg): 149/18 Body Mass Index(BMI): 29 Temperature(F): 98.5 Respiratory Rate(breaths/min): 18 Photos: [N/A:N/A] Wound Location: Left Foot N/A N/A Wounding Event: Pressure Injury N/A N/A Primary Etiology: Pressure Ulcer N/A N/A Comorbid History: Cataracts, Anemia, Coronary Artery N/A N/A Disease, Hypertension, Myocardial Infarction, End Stage Renal Disease, Gout, Osteoarthritis, Dementia Date Acquired: 10/02/2020 N/A N/A Weeks of Treatment: 5 N/A N/A Wound Status: Open N/A N/A Measurements L x W x D (cm) 0.4x0.4x0.3 N/A N/A Area (cm) : 0.126 N/A N/A Volume (cm) : 0.038 N/A N/A % Reduction in Area: 59.90% N/A N/A % Reduction in Volume: -22.60% N/A N/A Classification: Category/Stage III N/A N/A Exudate Amount: Medium N/A N/A Exudate Type: Serosanguineous N/A N/A Exudate Color: red, brown N/A N/A Wound Margin: Thickened N/A N/A Granulation Amount: Large (67-100%) N/A N/A Granulation Quality: Red, Pink N/A N/A Necrotic Amount: Small (1-33%) N/A N/A Exposed Structures: Fat Layer (Subcutaneous Tissue): N/A N/A Yes Fascia: No Tendon: No Muscle: No Joint: No Bone: No Treatment Notes Electronic Signature(s) Signed: 11/06/2020  4:22:54 PM By: Donnamarie Poag Entered ByDonnamarie Poag on 11/06/2020 09:41:27 Dejarnett, Capitola ([id number]) -------------------------------------------------------------------------------- [place] Details Patient Name: [name] [name], [name] Date of Service: 11/06/2020 9:30 AM Medical Record Number: [id number] Patient Account Number: [id number] Date of Birth/Sex: 07/10/1926 (87 y.o. F) Treating RN: Donnamarie Poag Primary Care Deivi Huckins: [name] Other Clinician: Referring Kahlan Engebretson: [name] Treating Dineen Conradt/Extender: Skipper Cliche in Treatment: 19 Active Inactive Wound/Skin Impairment Nursing Diagnoses: Impaired tissue integrity Goals: Patient/caregiver will verbalize understanding of skin care regimen Date Initiated: 06/26/2020 Date Inactivated: 08/21/2020 Target Resolution Date: 06/26/2020 Goal Status: Met Ulcer/skin breakdown will have a volume reduction of 30% by week 4 Date Initiated: 06/26/2020 Date  Inactivated: 08/21/2020 Target Resolution Date: 07/27/2020 Goal Status: Met Ulcer/skin breakdown will have a volume reduction of 50% by week 8 Date Initiated: 06/26/2020 Date Inactivated: 09/18/2020 Target Resolution Date: 08/26/2020 Goal Status: Unmet Unmet Reason: con't tx Ulcer/skin breakdown will have a volume reduction of 80% by week 12 Date Initiated: 06/26/2020 Date Inactivated: 10/30/2020 Target Resolution Date: 09/26/2020 Goal Status: Met Ulcer/skin breakdown will heal within 14 weeks Date Initiated: 06/26/2020 Target Resolution Date: 10/27/2020 Goal Status: Active Interventions: Assess patient/caregiver ability to obtain necessary supplies Assess patient/caregiver ability to perform ulcer/skin care regimen upon admission and as needed Assess ulceration(s) every visit Provide education on ulcer and skin care Treatment Activities: Referred to DME Zamyiah Tino for dressing supplies : 06/26/2020 Skin care regimen initiated : 06/26/2020 Notes: Electronic  Signature(s) Signed: 11/06/2020 4:22:54 PM By: Donnamarie Poag Entered ByDonnamarie Poag on 11/06/2020 09:41:18 [name] [name], [name] ([id number]) -------------------------------------------------------------------------------- Pain Assessment Details Patient Name: [name] [name], [name] Date of Service: 11/06/2020 9:30 AM Medical Record Number: [id number] Patient Account Number: [id number] Date of Birth/Sex: 07/31/1926 (87 y.o. F) Treating RN: Donnamarie Poag Primary Care Connie Lasater: [name] Other Clinician: Referring Alano Blasco: [name] Treating Shaleka Brines/Extender: Skipper Cliche in Treatment: 19 Active Problems Location of Pain Severity and Description of Pain Patient Has Paino No Site Locations Rate the pain. Current Pain Level: 0 Pain Management and Medication Current Pain Management: Electronic Signature(s) Signed: 11/06/2020 4:22:54 PM By: Donnamarie Poag Entered By: Donnamarie Poag on 11/06/2020 09:35:58 [name] [name], [name] ([id number]) -------------------------------------------------------------------------------- Patient/Caregiver Education Details Patient Name: [name] [name], [name] Date of Service: 11/06/2020 9:30 AM Medical Record Number: [id number] Patient Account Number: [id number] Date of Birth/Gender: 1926-04-26 (87 y.o. F) Treating RN: Donnamarie Poag Primary Care Physician: [name] Other Clinician: Referring Physician: [name] Treating Physician/Extender: Skipper Cliche in Treatment: 80 Education Assessment Education Provided To: Patient and Caregiver Education Topics Provided Wound/Skin Impairment: Electronic Signature(s) Signed: 11/06/2020 4:22:54 PM By: Donnamarie Poag Entered ByDonnamarie Poag on 11/06/2020 10:11:54 [name] [name], [name] ([id number]) -------------------------------------------------------------------------------- Wound Assessment Details Patient Name: Jares, Kaidan L. Date of Service: 11/06/2020 9:30 AM Medical Record Number: [id number] Patient  Account Number: [id number] Date of Birth/Sex: 07-27-1926 (87 y.o. F) Treating RN: Donnamarie Poag Primary Care Marcio Hoque: [name] Other Clinician: Referring Shiquita Collignon: [name] Treating Ivoree Felmlee/Extender: Skipper Cliche in Treatment: 19 Wound Status Wound Number: 3 Primary Pressure Ulcer Etiology: Wound Location: Left Foot Wound Open Wounding Event: Pressure Injury Status: Date Acquired: 10/02/2020 Comorbid Cataracts, Anemia, Coronary Artery Disease, Weeks Of Treatment: 5 History: Hypertension, Myocardial Infarction, End Stage Renal Clustered Wound: No Disease, Gout, Osteoarthritis, Dementia Photos Wound Measurements Length: (cm) 0.4 % Redu Width: (cm) 0.4 % Redu Depth: (cm) 0.3 Tunnel Area: (cm) 0.126 Under Volume: (cm) 0.038 ction in Area: 59.9% ction in Volume: -22.6% ing: No mining: No Wound Description Classification: Category/Stage III Foul Wound Margin: Thickened Sloug Exudate Amount: Medium Exudate Type: Serosanguineous Exudate Color: red, brown Odor After Cleansing: No h/Fibrino Yes Wound Bed Granulation Amount: Large (67-100%) Exposed Structure Granulation Quality: Red, Pink Fascia Exposed: No Necrotic Amount: Small (1-33%) Fat Layer (Subcutaneous Tissue) Exposed: Yes Necrotic Quality: Adherent Slough Tendon Exposed: No Muscle Exposed: No Kazee, Simona L. ([id number]) Joint Exposed: No Bone Exposed: No Treatment Notes Wound #3 (Foot) Wound Laterality: Left Cleanser Soap and Water Discharge Instruction: Gently cleanse wound with antibacterial soap, rinse and pat dry prior to dressing wounds Peri-Wound Care Topical Primary Dressing Hydrofera Blue Ready Transfer Foam, 2.5x2.5 (in/in) Discharge Instruction: Apply Hydrofera Blue Ready to wound bed as directed Secondary  Dressing ABD Pad 5x9 (in/in) Discharge Instruction: Cover with ABD pad Secured With Compression Wrap Profore Lite LF 3 Multilayer Compression Bandaging System Discharge  Instruction: Apply 3 multi-layer wrap as prescribed. Compression Stockings Add-Ons Electronic Signature(s) Signed: 11/06/2020 4:22:54 PM By: Donnamarie Poag Entered By: Donnamarie Poag on 11/06/2020 10:12:52 Gapinski, Jessye L. ([id number]) -------------------------------------------------------------------------------- Vitals Details Patient Name: Marinello, Katryn L. Date of Service: 11/06/2020 9:30 AM Medical Record Number: [id number] Patient Account Number: [id number] Date of Birth/Sex: 13-Oct-1926 (87 y.o. F) Treating RN: Donnamarie Poag Primary Care Irby Fails: [name] Other Clinician: Referring Karnisha Lefebre: [name] Treating Quida Glasser/Extender: Skipper Cliche in Treatment: 19 Vital Signs Time Taken: 09:34 Temperature (F): 98.5 Height (in): 58 Pulse (bpm): 83 Weight (lbs): 137 Respiratory Rate (breaths/min): 18 Body Mass Index (BMI): 28.6 Blood Pressure (mmHg): 149/18 Reference Range: 80 - 120 mg / dl Electronic Signature(s) Signed: 11/06/2020 4:22:54 PM By: Donnamarie Poag Entered ByDonnamarie Poag on 11/06/2020 09:35:40

## 2020-11-13 ENCOUNTER — Other Ambulatory Visit: Payer: Self-pay

## 2020-11-13 ENCOUNTER — Ambulatory Visit: Payer: PPO | Admitting: Physician Assistant

## 2020-11-13 DIAGNOSIS — L89623 Pressure ulcer of left heel, stage 3: Secondary | ICD-10-CM | POA: Diagnosis not present

## 2020-11-14 NOTE — Progress Notes (Signed)
Fedewa, Landon L. ([id number]) Visit Report for 11/13/2020 Arrival Information Details Patient Name: [name] [name], [name] Date of Service: 11/13/2020 9:30 AM Medical Record Number: [id number] Patient Account Number: [id number] Date of Birth/Sex: 1927/01/16 (87 y.o. F) Treating RN: Donnamarie Poag Primary Care Leasa Kincannon: [name] Other Clinician: Referring Cydni Reddoch: [name] Treating Moorea Boissonneault/Extender: Skipper Cliche in Treatment: 20 Visit Information History Since Last Visit Added or deleted any medications: No Patient Arrived: Wheel Chair Had a fall or experienced change in No Arrival Time: 09:33 activities of daily living that may affect Accompanied By: daughter risk of falls: Transfer Assistance: None Hospitalized since last visit: No Patient Requires Transmission-Based No Has Dressing in Place as Prescribed: Yes Precautions: Has Compression in Place as Prescribed: Yes Patient Has Alerts: Yes Pain Present Now: No Patient Alerts: NOT diabetic ABI R1.18 L1.25 06/27/20 Electronic Signature(s) Signed: 11/14/2020 3:51:14 PM By: Donnamarie Poag Entered ByDonnamarie Poag on 11/13/2020 09:34:38 [name] [name], [name] ([id number]) -------------------------------------------------------------------------------- Clinic Level of Care Assessment Details Patient Name: [name] [name], [name] Date of Service: 11/13/2020 9:30 AM Medical Record Number: [id number] Patient Account Number: [id number] Date of Birth/Sex: 06-02-1926 (87 y.o. F) Treating RN: Donnamarie Poag Primary Care Brandonn Capelli: [name] Other Clinician: Referring Jessamyn Watterson: [name] Treating Bern Fare/Extender: Skipper Cliche in Treatment: 20 Clinic Level of Care Assessment Items TOOL 1 Quantity Score '[]'$  - Use when EandM and Procedure is performed on INITIAL visit 0 ASSESSMENTS - Nursing Assessment / Reassessment '[]'$  - General Physical Exam (combine w/ comprehensive assessment (listed just below) when performed on new 0 pt.  evals) '[]'$  - 0 Comprehensive Assessment (HX, ROS, Risk Assessments, Wounds Hx, etc.) ASSESSMENTS - Wound and Skin Assessment / Reassessment '[]'$  - Dermatologic / Skin Assessment (not related to wound area) 0 ASSESSMENTS - Ostomy and/or Continence Assessment and Care '[]'$  - Incontinence Assessment and Management 0 '[]'$  - 0 Ostomy Care Assessment and Management (repouching, etc.) PROCESS - Coordination of Care '[]'$  - Simple Patient / Family Education for ongoing care 0 '[]'$  - 0 Complex (extensive) Patient / Family Education for ongoing care '[]'$  - 0 Staff obtains Programmer, systems, Records, Test Results / Process Orders '[]'$  - 0 Staff telephones HHA, Nursing Homes / Clarify orders / etc '[]'$  - 0 Routine Transfer to another Facility (non-emergent condition) '[]'$  - 0 Routine Hospital Admission (non-emergent condition) '[]'$  - 0 New Admissions / Biomedical engineer / Ordering NPWT, Apligraf, etc. '[]'$  - 0 Emergency Hospital Admission (emergent condition) PROCESS - Special Needs '[]'$  - Pediatric / Minor Patient Management 0 '[]'$  - 0 Isolation Patient Management '[]'$  - 0 Hearing / Language / Visual special needs '[]'$  - 0 Assessment of Community assistance (transportation, D/C planning, etc.) '[]'$  - 0 Additional assistance / Altered mentation '[]'$  - 0 Support Surface(s) Assessment (bed, cushion, seat, etc.) INTERVENTIONS - Miscellaneous '[]'$  - External ear exam 0 '[]'$  - 0 Patient Transfer (multiple staff / Civil Service fast streamer / Similar devices) '[]'$  - 0 Simple Staple / Suture removal (25 or less) '[]'$  - 0 Complex Staple / Suture removal (26 or more) '[]'$  - 0 Hypo/Hyperglycemic Management (do not check if billed separately) '[]'$  - 0 Ankle / Brachial Index (ABI) - do not check if billed separately Has the patient been seen at the hospital within the last three years: Yes Total Score: 0 Level Of Care: ____ [name] [name], [name] ([id number]) Electronic Signature(s) Signed: 11/14/2020 3:51:14 PM By: Donnamarie Poag Entered By: Donnamarie Poag on  11/13/2020 09:47:25 [name] [name], [name] ([id number]) -------------------------------------------------------------------------------- Compression Therapy Details Patient Name: [name] [name], [name] Date  of Service: 11/13/2020 9:30 AM Medical Record Number: [id number] Patient Account Number: [id number] Date of Birth/Sex: 1926-05-27 (87 y.o. F) Treating RN: Donnamarie Poag Primary Care Hasheem Voland: [name] Other Clinician: Referring Hazen Brumett: [name] Treating Clary Meeker/Extender: Skipper Cliche in Treatment: 20 Compression Therapy Performed for Wound Assessment: Wound #3 Left Foot Performed By: Clinician Donnamarie Poag, RN Compression Type: Three Layer Electronic Signature(s) Signed: 11/14/2020 3:51:14 PM By: Donnamarie Poag Entered By: Donnamarie Poag on 11/13/2020 09:35:13 [name] [name], [name] [name] ([id number]) -------------------------------------------------------------------------------- Encounter Discharge Information Details Patient Name: [name] [name], [name] Date of Service: 11/13/2020 9:30 AM Medical Record Number: [id number] Patient Account Number: [id number] Date of Birth/Sex: Jun 24, 1926 (87 y.o. F) Treating RN: Donnamarie Poag Primary Care Aleeya Veitch: [name] Other Clinician: Referring Yves Fodor: [name] Treating Jeret Goyer/Extender: Skipper Cliche in Treatment: 20 Encounter Discharge Information Items Discharge Condition: Stable Ambulatory Status: Wheelchair Discharge Destination: Home Transportation: Private Auto Accompanied By: daughter Schedule Follow-up Appointment: Yes Clinical Summary of Care: Electronic Signature(s) Signed: 11/14/2020 3:51:14 PM By: Donnamarie Poag Entered By: Donnamarie Poag on 11/13/2020 09:46:59 [name] [name], [name] ([id number]) -------------------------------------------------------------------------------- Wound Assessment Details Patient Name: [name] [name], [name] Date of Service: 11/13/2020 9:30 AM Medical Record Number: [id number] Patient Account Number:  [id number] Date of Birth/Sex: Mar 29, 1926 (87 y.o. F) Treating RN: Donnamarie Poag Primary Care Suella Cogar: [name] Other Clinician: Referring Kendan Cornforth: [name] Treating Ndeye Tenorio/Extender: Skipper Cliche in Treatment: 20 Wound Status Wound Number: 3 Primary Pressure Ulcer Etiology: Wound Location: Left Foot Wound Open Wounding Event: Pressure Injury Status: Date Acquired: 10/02/2020 Comorbid Cataracts, Anemia, Coronary Artery Disease, Weeks Of Treatment: 6 History: Hypertension, Myocardial Infarction, End Stage Renal Clustered Wound: No Disease, Gout, Osteoarthritis, Dementia Wound Measurements Length: (cm) 0.4 Width: (cm) 0.4 Depth: (cm) 0.3 Area: (cm) 0.126 Volume: (cm) 0.038 % Reduction in Area: 59.9% % Reduction in Volume: -22.6% Wound Description Classification: Category/Stage III Wound Margin: Thickened Exudate Amount: Medium Exudate Type: Serosanguineous Exudate Color: red, brown Foul Odor After Cleansing: No Slough/Fibrino Yes Wound Bed Granulation Amount: Large (67-100%) Exposed Structure Granulation Quality: Red, Pink Fascia Exposed: No Necrotic Amount: Small (1-33%) Fat Layer (Subcutaneous Tissue) Exposed: Yes Necrotic Quality: Adherent Slough Tendon Exposed: No Muscle Exposed: No Joint Exposed: No Bone Exposed: No Treatment Notes Wound #3 (Foot) Wound Laterality: Left Cleanser Soap and Water Discharge Instruction: Gently cleanse wound with antibacterial soap, rinse and pat dry prior to dressing wounds Peri-Wound Care Topical Primary Dressing Hydrofera Blue Ready Transfer Foam, 2.5x2.5 (in/in) Discharge Instruction: Apply Hydrofera Blue Ready to wound bed as directed Secondary Dressing ABD Pad 5x9 (in/in) Discharge Instruction: Cover with ABD pad Secured With Compression Wrap Profore Lite LF 3 Multilayer Compression Bandaging System Breece, Deniya L. ([id number]) Discharge Instruction: Apply 3 multi-layer wrap as  prescribed. Compression Stockings Add-Ons Electronic Signature(s) Signed: 11/14/2020 3:51:14 PM By: Donnamarie Poag Entered ByDonnamarie Poag on 11/13/2020 09:34:53

## 2020-11-20 ENCOUNTER — Other Ambulatory Visit: Payer: Self-pay

## 2020-11-20 ENCOUNTER — Encounter: Payer: PPO | Attending: Physician Assistant | Admitting: Physician Assistant

## 2020-11-20 DIAGNOSIS — I2510 Atherosclerotic heart disease of native coronary artery without angina pectoris: Secondary | ICD-10-CM | POA: Insufficient documentation

## 2020-11-20 DIAGNOSIS — N183 Chronic kidney disease, stage 3 unspecified: Secondary | ICD-10-CM | POA: Insufficient documentation

## 2020-11-20 DIAGNOSIS — Z87891 Personal history of nicotine dependence: Secondary | ICD-10-CM | POA: Diagnosis not present

## 2020-11-20 DIAGNOSIS — I129 Hypertensive chronic kidney disease with stage 1 through stage 4 chronic kidney disease, or unspecified chronic kidney disease: Secondary | ICD-10-CM | POA: Insufficient documentation

## 2020-11-20 DIAGNOSIS — L89893 Pressure ulcer of other site, stage 3: Secondary | ICD-10-CM | POA: Insufficient documentation

## 2020-11-20 NOTE — Progress Notes (Addendum)
Carline, Loris L. ([id number]) Visit Report for 11/20/2020 Chief Complaint Document Details Patient Name: [name] [name], [name] Date of Service: 11/20/2020 11:30 AM Medical Record Number: [id number] Patient Account Number: [id number] Date of Birth/Sex: 04-06-1926 (87 y.o. F) Treating RN: Donnamarie Poag Primary Care Provider: [name] Other Clinician: Referring Provider: [name] Treating Provider/Extender: Skipper Cliche in Treatment: 21 Information Obtained from: Patient Chief Complaint Left Heel Pressure Ulcer Electronic Signature(s) Signed: 11/20/2020 11:35:47 AM By: Worthy Keeler PA-C Entered By: Worthy Keeler on 11/20/2020 11:35:47 Ferryman, Kara L. ([id number]) -------------------------------------------------------------------------------- Debridement Details Patient Name: [name], [name] Date of Service: 11/20/2020 11:30 AM Medical Record Number: [id number] Patient Account Number: [id number] Date of Birth/Sex: 1926/11/24 (87 y.o. F) Treating RN: Carlene Coria Primary Care Provider: [name] Other Clinician: Referring Provider: [name] Treating Provider/Extender: Skipper Cliche in Treatment: 21 Debridement Performed for Wound #3 Left Foot Assessment: Performed By: Physician Tommie Sams., PA-C Debridement Type: Debridement Level of Consciousness (Pre- Awake and Alert procedure): Pre-procedure Verification/Time Out Yes - 11:57 Taken: Start Time: 11:57 Pain Control: Lidocaine Total Area Debrided (L x W): 0.4 (cm) x 0.6 (cm) = 0.24 (cm) Tissue and other material Viable, Non-Viable, Slough, Subcutaneous, Slough debrided: Level: Skin/Subcutaneous Tissue Debridement Description: Excisional Instrument: Curette Bleeding: Minimum Hemostasis Achieved: Pressure Response to Treatment: Procedure was tolerated well Level of Consciousness (Post- Awake and Alert procedure): Post Debridement Measurements of Total Wound Length: (cm) 0.4 Stage:  Category/Stage III Width: (cm) 0.6 Depth: (cm) 0.3 Volume: (cm) 0.057 Character of Wound/Ulcer Post Debridement: Improved Post Procedure Diagnosis Same as Pre-procedure Electronic Signature(s) Signed: 11/20/2020 5:40:02 PM By: Worthy Keeler PA-C Signed: 11/23/2020 12:57:29 PM By: Carlene Coria RN Entered By: Carlene Coria on 11/20/2020 12:02:40 Germer, Verlia L. ([id number]) -------------------------------------------------------------------------------- HPI Details Patient Name: [name], [name] Date of Service: 11/20/2020 11:30 AM Medical Record Number: [id number] Patient Account Number: [id number] Date of Birth/Sex: Jun 24, 1926 (87 y.o. F) Treating RN: Donnamarie Poag Primary Care Provider: [name] Other Clinician: Referring Provider: [name] Treating Provider/Extender: Skipper Cliche in Treatment: 21 History of Present Illness HPI Description: 87 year old patient was recently seen by the PCPs office for significant pain right great toe which has been going on since July. She was initially treated with Keflex which she did not complete and after the office visit this time she has been put on doxycycline. at the time of her visit she was found to have a ulcer on the plantar surface of the right great toe and also had a pyogenic granuloma over this area. X-ray of the right foot done 12/29/2014 -- IMPRESSION:Soft-tissue swelling and ulceration right great toe. No underlying bony lytic lesion identified. If osteomyelitis remains of clinical concern MRI can be obtained. Past medical history significant for anemia, chronic kidney disease stage III, obesity, varicose veins, coronary artery disease, gout, history of nicotine addiction given up smoking in 2002, hypertension, status post cardiac catheterization, status post abdominal hysterectomy, cholecystectomy and tonsillectomy. hemoglobin A1c done in August was 5.8 01/22/2015 -- at this stage the [facility] Walking boat was going to  cost them significant amount of money and they want to defer using that at the present time. 01/29/2015 -- she had a podiatry appointment and they have trimmed her toenails. She has not heard back from the vascular office regarding her venous duplex study and I have asked them to call personally so that they can get the appointment soon. 02/06/2015 -- they have made contact with the vascular office and from what I  understand that test has been done but the report is pending. 02/19/2014 -- the vascular test is scheduled for tomorrow Readmission: 06/26/2020 upon evaluation today patient appears for initial evaluation in her clinic that she has been here before in 2016 and has been quite sometime. She did have a fractured hip in February on the 23rd 2022. Subsequently this had to be pinned and she ended up with a pressure injury on her heel following when she was using her foot to help move her around in the bed. Subsequently this has led to the wound that she has been dealing with since that time. She lives at home with her daughter currently she does have dementia. 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 07/03/2020 upon evaluation today patient appears to be doing well with regard to her heel ulcer. She did have arterial studies they appear to be doing excellent it was premature normal across the board with TBI's in the 90s and ABIs well within normal range. Nonetheless there does not appear to be any signs of arterial insufficiency whatsoever. With that being said the patient does have also signs of improvement there is some necrotic tissue in the base of the wound number to try to clear some of that away today I do believe the Iodoflex/Iodosorb is doing well. 5/24; difficult punched out wound on the left medial heel. She has been using Iodoflex 07/17/2020 upon  evaluation today patient appears to be doing well at this point in regard to her wound. I do feel like this is a little bit deeper but again that is what is expected as we continue with the Iodoflex I think that this is just going to get deeper until we get to the base of the wound. With that being said I think we are getting much closer to the base of the wound where we can have healthier tissue that we get a be managing here which that will be awesome. In the meantime I am not surprised by what I am seeing and in fact the wound appears to be better as compared to previous findings. 07/24/2020 upon evaluation today patient appears to be doing well with regard to her wound. Overall I am extremely pleased with where things stand today. I do not see any signs of active infection which is great and overall I think that the patient is making good progress. There is good to be some need for sharp debridement today. 07/31/2020 upon evaluation today patient appears to be doing better in regard to her heel ulcer. She has been tolerating the dressing changes without complication. Fortunately there does not appear to be any signs of active infection which is great and overall very pleased in this regard. No fevers, chills, nausea, vomiting, or diarrhea. 08/14/2020 upon evaluation today patient appears to be doing better in regard to her heel ulcer. I am very pleased in that regard. Unfortunately she still has a slight deep tissue injury in regard to the medial portion of her foot over the same area. Unfortunately I think this is something that if were not careful it was can open up into the wound. That is my main concern here based on what I see. Fortunately there does not appear to be any evidence of active infection which is great news and overall very pleased with where things stand at this point. 08/21/2020 upon evaluation today patient  appears to be doing well with regard to her wound. She has been tolerating  the dressing changes without complication. Fortunately there does not appear to be any signs of active infection at this time. No fevers, chills, nausea, vomiting, or diarrhea. I do believe the compression wrap was beneficial for her. 08/28/2020 upon evaluation today patient appears to be doing well with regard to her wounds currently. Fortunately there does not appear to be any signs of active infection at this time. No fevers, chills, nausea, vomiting, or diarrhea. With that being said I think that her leg is doing quite well to be honest. 09/04/2020 upon evaluation today patient appears to be doing well with regard to her wound on the heel. This is showing signs of good epithelial growth although there is probably can it be an indention where this heals that is okay as long as we get it closed. Fortunately I do not see any evidence of infection at this point. 09/13/2020 upon evaluation today patient appears to be doing well with regard to her wounds. She has been tolerating the dressing changes Ginley, Chee L. ([id number]) without complication. Fortunately he is actually doing extremely well and I think she is making great progress this is measuring smaller. I would recommend that such that we continue with the [facility] likely since he is doing so well. 09/18/2020 upon evaluation today patient appears to be doing well with regard to her heel ulcer. She is making good progress and I am very pleased with what we see today. I think the Hydrofera Blue is still doing excellent. 10/02/2020 upon evaluation today patient's wound actually appears to be showing signs of good improvement in regard to the heel. I am very pleased in this regard. With that being said I do believe that the area which is somewhat been stable and dry is starting to lift up and beginning to clear this away as well that is on the foot. Subsequently I am going to have to perform some debridement here and we did actually go ahead  and allow this as a wound today as before it was just more deep tissue injury and eschar covering now I think it is a little bit more than that to be honest. 10/09/2020 upon evaluation today patient appears to be doing decently well in regard to her wounds. I am actually very pleased with where things stand today. There does not appear to be any signs of active infection which is great news. No fevers, chills, nausea, vomiting, or diarrhea. She is going require some sharp debridement today. 10/16/2020 upon evaluation today patient appears to be doing decently well in regard to her heel as well as the foot. Both are showing signs of good improvement which is great news. In general and extremely pleased with where things stand at this point. She is tolerating the [facility] well although this is getting so small in the heel I think collagen may be a better option here based on what I am seeing. With regard to the foot the Iodoflex/Iodosorb is still probably the best method here. 10/26/2020 upon evaluation today patient actually appears to be doing well in some respects today. She has been tolerating the dressing changes without complication and this is great news. The Hydrofera Blue is done well for the heel this actually appears to be healed today. With that being said in regard to the foot I think we are ready to switch to [facility] based on what I see at  this point. 10/30/2020 upon evaluation today patient appears to be doing well with regard to his wound. He has been tolerating the dressing changes without complication. Fortunately there does not appear to be any signs of active infection systemically at this time which is great news and overall very pleased with where things stand today. I do think that she is making progress although it somewhat slow still where showing signs of improvement little by little here. 11/06/2020 upon evaluation today patient appears to be doing decently well in  regard to her wound. We will start and see better overall appearance of the base of the wound which is great news she still continues to have some abnormal fluorescence signals with a Molecu[employer] DX that will be detailed below. 11/20/2020 upon evaluation today patient's wound actually showing signs of improvement. There is good to be some need for sharp debridement today and that was discussed with the patient. I am going to go ahead and clean this up but I do believe the [facility] is doing a great job. Electronic Signature(s) Signed: 11/20/2020 1:27:27 PM By: Worthy Keeler PA-C Entered By: Worthy Keeler on 11/20/2020 13:27:27 Moure, Ardean L. ([id number]) -------------------------------------------------------------------------------- Physical Exam Details Patient Name: Begin, Liliah L. Date of Service: 11/20/2020 11:30 AM Medical Record Number: [id number] Patient Account Number: [id number] Date of Birth/Sex: 1926/03/30 (87 y.o. F) Treating RN: Donnamarie Poag Primary Care Provider: [name] Other Clinician: Referring Provider: [name] Treating Provider/Extender: Skipper Cliche in Treatment: 21 Constitutional Well-nourished and well-hydrated in no acute distress. Respiratory normal breathing without difficulty. Psychiatric this patient is able to make decisions and demonstrates good insight into disease process. Alert and Oriented x 3. pleasant and cooperative. Notes Upon inspection patient's wound bed actually showed signs of good granulation epithelization at this point. Fortunately there does not appear to be any evidence of active infection which is great news and overall I am extremely pleased with where we stand today. No fevers, chills, nausea, vomiting, or diarrhea. Electronic Signature(s) Signed: 11/20/2020 1:27:42 PM By: Worthy Keeler PA-C Entered By: Worthy Keeler on 11/20/2020 13:27:42 Weins, Francenia L.  ([id number]) -------------------------------------------------------------------------------- Physician Orders Details Patient Name: Kirby, Aaron L. Date of Service: 11/20/2020 11:30 AM Medical Record Number: [id number] Patient Account Number: [id number] Date of Birth/Sex: 1926-12-04 (87 y.o. F) Treating RN: Carlene Coria Primary Care Provider: [name] Other Clinician: Referring Provider: [name] Treating Provider/Extender: Skipper Cliche in Treatment: 21 Verbal / Phone Orders: No Diagnosis Coding ICD-10 Coding Code Description [phone] Pressure ulcer of left heel, stage 3 L89.893 Pressure ulcer of other site, stage 3 I10 Essential (primary) hypertension F01.50 Vascular dementia without behavioral disturbance I25.10 Atherosclerotic heart disease of native coronary artery without angina pectoris Follow-up Appointments o Return Appointment in 1 week. o Nurse Visit as needed Bathing/ Shower/ Hygiene o May shower with wound dressing protected with water repellent cover or cast protector. o No tub bath. Edema Control - Lymphedema / Segmental Compressive Device / Other o Patient to wear own compression stockings. Remove compression stockings every night before going to bed and put on every morning when getting up. - right leg o Elevate legs to the level of the heart and pump ankles as often as possible o Elevate leg(s) parallel to the floor when sitting. o DO YOUR BEST to sleep in the bed at night. DO NOT sleep in your recliner. Long hours of sitting in a recliner leads to swelling of the legs and/or potential wounds on your backside.  Off-Loading o Other: - Offloading boots while in bed; Try pillow under sheet to keep it in place. Wound Treatment Wound #3 - Foot Wound Laterality: Left Cleanser: Soap and Water 1 x Per Week/30 Days Discharge Instructions: Gently cleanse wound with antibacterial soap, rinse and pat dry prior to dressing wounds Primary  Dressing: Hydrofera Blue Ready Transfer Foam, 2.5x2.5 (in/in) 1 x Per Week/30 Days Discharge Instructions: Apply Hydrofera Blue Ready to wound bed as directed Secondary Dressing: ABD Pad 5x9 (in/in) 1 x Per Week/30 Days Discharge Instructions: Cover with ABD pad Compression Wrap: Profore Lite LF 3 Multilayer Compression Bandaging System 1 x Per Week/30 Days Discharge Instructions: Apply 3 multi-layer wrap as prescribed. Electronic Signature(s) Signed: 11/20/2020 5:40:02 PM By: Worthy Keeler PA-C Signed: 11/23/2020 12:57:29 PM By: Carlene Coria RN Entered By: Carlene Coria on 11/20/2020 12:01:26 Kloosterman, Reniyah L. ([id number]) -------------------------------------------------------------------------------- Problem List Details Patient Name: Dejaynes, Anuja L. Date of Service: 11/20/2020 11:30 AM Medical Record Number: [id number] Patient Account Number: [id number] Date of Birth/Sex: July 27, 1926 (87 y.o. F) Treating RN: Donnamarie Poag Primary Care Provider: [name] Other Clinician: Referring Provider: [name] Treating Provider/Extender: Skipper Cliche in Treatment: 21 Active Problems ICD-10 Encounter Code Description Active Date MDM Diagnosis [phone] Pressure ulcer of left heel, stage 3 06/26/2020 No Yes L89.893 Pressure ulcer of other site, stage 3 10/02/2020 No Yes I10 Essential (primary) hypertension 06/26/2020 No Yes F01.50 Vascular dementia without behavioral disturbance 06/26/2020 No Yes I25.10 Atherosclerotic heart disease of native coronary artery without angina 06/26/2020 No Yes pectoris Inactive Problems Resolved Problems Electronic Signature(s) Signed: 11/20/2020 11:35:37 AM By: Worthy Keeler PA-C Entered By: Worthy Keeler on 11/20/2020 11:35:37 Doell, Elize L. ([id number]) -------------------------------------------------------------------------------- Progress Note Details Patient Name: Daffin, Zareth L. Date of Service: 11/20/2020 11:30 AM Medical Record  Number: [id number] Patient Account Number: [id number] Date of Birth/Sex: 06-13-1926 (87 y.o. F) Treating RN: Donnamarie Poag Primary Care Provider: [name] Other Clinician: Referring Provider: [name] Treating Provider/Extender: Skipper Cliche in Treatment: 21 Subjective Chief Complaint Information obtained from Patient Left Heel Pressure Ulcer History of Present Illness (HPI) 87 year old patient was recently seen by the PCPs office for significant pain right great toe which has been going on since July. She was initially treated with Keflex which she did not complete and after the office visit this time she has been put on doxycycline. at the time of her visit she was found to have a ulcer on the plantar surface of the right great toe and also had a pyogenic granuloma over this area. X-ray of the right foot done 12/29/2014 -- IMPRESSION:Soft-tissue swelling and ulceration right great toe. No underlying bony lytic lesion identified. If osteomyelitis remains of clinical concern MRI can be obtained. Past medical history significant for anemia, chronic kidney disease stage III, obesity, varicose veins, coronary artery disease, gout, history of nicotine addiction given up smoking in 2002, hypertension, status post cardiac catheterization, status post abdominal hysterectomy, cholecystectomy and tonsillectomy. hemoglobin A1c done in August was 5.8 01/22/2015 -- at this stage the [facility] Walking boat was going to cost them significant amount of money and they want to defer using that at the present time. 01/29/2015 -- she had a podiatry appointment and they have trimmed her toenails. She has not heard back from the vascular office regarding her venous duplex study and I have asked them to call personally so that they can get the appointment soon. 02/06/2015 -- they have made contact with the vascular office and from what I  understand that test has been done but the report is  pending. 02/19/2014 -- the vascular test is scheduled for tomorrow Readmission: 06/26/2020 upon evaluation today patient appears for initial evaluation in her clinic that she has been here before in 2016 and has been quite sometime. She did have a fractured hip in February on the 23rd 2022. Subsequently this had to be pinned and she ended up with a pressure injury on her heel following when she was using her foot to help move her around in the bed. Subsequently this has led to the wound that she has been dealing with since that time. She lives at home with her daughter currently she does have dementia. 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 07/03/2020 upon evaluation today patient appears to be doing well with regard to her heel ulcer. She did have arterial studies they appear to be doing excellent it was premature normal across the board with TBI's in the 90s and ABIs well within normal range. Nonetheless there does not appear to be any signs of arterial insufficiency whatsoever. With that being said the patient does have also signs of improvement there is some necrotic tissue in the base of the wound number to try to clear some of that away today I do believe the Iodoflex/Iodosorb is doing well. 5/24; difficult punched out wound on the left medial heel. She has been using Iodoflex 07/17/2020 upon evaluation today patient appears to be doing well at this point in regard to her wound. I do feel like this is a little bit deeper but again that is what is expected as we continue with the Iodoflex I think that this is just going to get deeper until we get to the base of the wound. With that being said I think we are getting much closer to the base of the wound where we can have healthier tissue that we get a be managing here which that will be awesome. In the meantime I  am not surprised by what I am seeing and in fact the wound appears to be better as compared to previous findings. 07/24/2020 upon evaluation today patient appears to be doing well with regard to her wound. Overall I am extremely pleased with where things stand today. I do not see any signs of active infection which is great and overall I think that the patient is making good progress. There is good to be some need for sharp debridement today. 07/31/2020 upon evaluation today patient appears to be doing better in regard to her heel ulcer. She has been tolerating the dressing changes without complication. Fortunately there does not appear to be any signs of active infection which is great and overall very pleased in this regard. No fevers, chills, nausea, vomiting, or diarrhea. 08/14/2020 upon evaluation today patient appears to be doing better in regard to her heel ulcer. I am very pleased in that regard. Unfortunately she still has a slight deep tissue injury in regard to the medial portion of her foot over the same area. Unfortunately I think this is something that if were not careful it was can open up into the wound. That is my main concern here based on what I see. Fortunately there does not appear to be any evidence of active infection which is great news and overall very pleased with where things stand at this point. 08/21/2020 upon evaluation today  patient appears to be doing well with regard to her wound. She has been tolerating the dressing changes without complication. Fortunately there does not appear to be any signs of active infection at this time. No fevers, chills, nausea, vomiting, or diarrhea. I do believe the compression wrap was beneficial for her. 08/28/2020 upon evaluation today patient appears to be doing well with regard to her wounds currently. Fortunately there does not appear to be any signs of active infection at this time. No fevers, chills, nausea, vomiting, or diarrhea. With  that being said I think that her leg is doing quite well to be honest. Laubscher, Renelle L. ([id number]) 09/04/2020 upon evaluation today patient appears to be doing well with regard to her wound on the heel. This is showing signs of good epithelial growth although there is probably can it be an indention where this heals that is okay as long as we get it closed. Fortunately I do not see any evidence of infection at this point. 09/13/2020 upon evaluation today patient appears to be doing well with regard to her wounds. She has been tolerating the dressing changes without complication. Fortunately he is actually doing extremely well and I think she is making great progress this is measuring smaller. I would recommend that such that we continue with the [facility] likely since he is doing so well. 09/18/2020 upon evaluation today patient appears to be doing well with regard to her heel ulcer. She is making good progress and I am very pleased with what we see today. I think the Hydrofera Blue is still doing excellent. 10/02/2020 upon evaluation today patient's wound actually appears to be showing signs of good improvement in regard to the heel. I am very pleased in this regard. With that being said I do believe that the area which is somewhat been stable and dry is starting to lift up and beginning to clear this away as well that is on the foot. Subsequently I am going to have to perform some debridement here and we did actually go ahead and allow this as a wound today as before it was just more deep tissue injury and eschar covering now I think it is a little bit more than that to be honest. 10/09/2020 upon evaluation today patient appears to be doing decently well in regard to her wounds. I am actually very pleased with where things stand today. There does not appear to be any signs of active infection which is great news. No fevers, chills, nausea, vomiting, or diarrhea. She is going require some sharp  debridement today. 10/16/2020 upon evaluation today patient appears to be doing decently well in regard to her heel as well as the foot. Both are showing signs of good improvement which is great news. In general and extremely pleased with where things stand at this point. She is tolerating the [facility] well although this is getting so small in the heel I think collagen may be a better option here based on what I am seeing. With regard to the foot the Iodoflex/Iodosorb is still probably the best method here. 10/26/2020 upon evaluation today patient actually appears to be doing well in some respects today. She has been tolerating the dressing changes without complication and this is great news. The Hydrofera Blue is done well for the heel this actually appears to be healed today. With that being said in regard to the foot I think we are ready to switch to [facility] based on what I see  at this point. 10/30/2020 upon evaluation today patient appears to be doing well with regard to his wound. He has been tolerating the dressing changes without complication. Fortunately there does not appear to be any signs of active infection systemically at this time which is great news and overall very pleased with where things stand today. I do think that she is making progress although it somewhat slow still where showing signs of improvement little by little here. 11/06/2020 upon evaluation today patient appears to be doing decently well in regard to her wound. We will start and see better overall appearance of the base of the wound which is great news she still continues to have some abnormal fluorescence signals with a MolecuLight DX that will be detailed below. 11/20/2020 upon evaluation today patient's wound actually showing signs of improvement. There is good to be some need for sharp debridement today and that was discussed with the patient. I am going to go ahead and clean this up but I do believe the  [facility] is doing a great job. Objective Constitutional Well-nourished and well-hydrated in no acute distress. Vitals Time Taken: 11:44 AM, Height: 58 in, Weight: 137 lbs, BMI: 28.6, Temperature: 97.5 F, Pulse: 93 bpm, Respiratory Rate: 18 breaths/min, Blood Pressure: 154/74 mmHg. Respiratory normal breathing without difficulty. Psychiatric this patient is able to make decisions and demonstrates good insight into disease process. Alert and Oriented x 3. pleasant and cooperative. General Notes: Upon inspection patient's wound bed actually showed signs of good granulation epithelization at this point. Fortunately there does not appear to be any evidence of active infection which is great news and overall I am extremely pleased with where we stand today. No fevers, chills, nausea, vomiting, or diarrhea. Integumentary (Hair, Skin) Wound #3 status is Open. Original cause of wound was Pressure Injury. The date acquired was: 10/02/2020. The wound has been in treatment 7 weeks. The wound is located on the Left Foot. The wound measures 0.4cm length x 0.6cm width x 0.3cm depth; 0.188cm^2 area and 0.057cm^3 volume. There is Fat Layer (Subcutaneous Tissue) exposed. There is no tunneling or undermining noted. There is a medium amount of serosanguineous drainage noted. The wound margin is thickened. There is large (67-100%) red, pink granulation within the wound bed. There is a small (1-33%) amount of necrotic tissue within the wound bed including Adherent Slough. Daddona, Danny L. ([id number]) Assessment Active Problems ICD-10 Pressure ulcer of left heel, stage 3 Pressure ulcer of other site, stage 3 Essential (primary) hypertension Vascular dementia without behavioral disturbance Atherosclerotic heart disease of native coronary artery without angina pectoris Procedures Wound #3 Pre-procedure diagnosis of Wound #3 is a Pressure Ulcer located on the Left Foot . There was a Excisional  Skin/Subcutaneous Tissue Debridement with a total area of 0.24 sq cm performed by Tommie Sams., PA-C. With the following instrument(s): Curette to remove Viable and Non-Viable tissue/material. Material removed includes Subcutaneous Tissue and Slough and after achieving pain control using Lidocaine. A time out was conducted at 11:57, prior to the start of the procedure. A Minimum amount of bleeding was controlled with Pressure. The procedure was tolerated well. Post Debridement Measurements: 0.4cm length x 0.6cm width x 0.3cm depth; 0.057cm^3 volume. Post debridement Stage noted as Category/Stage III. Character of Wound/Ulcer Post Debridement is improved. Post procedure Diagnosis Wound #3: Same as Pre-Procedure Pre-procedure diagnosis of Wound #3 is a Pressure Ulcer located on the Left Foot . There was a Three Layer Compression Therapy Procedure by Carlene Coria, RN. Post  procedure Diagnosis Wound #3: Same as Pre-Procedure Plan Follow-up Appointments: Return Appointment in 1 week. Nurse Visit as needed Bathing/ Shower/ Hygiene: May shower with wound dressing protected with water repellent cover or cast protector. No tub bath. Edema Control - Lymphedema / Segmental Compressive Device / Other: Patient to wear own compression stockings. Remove compression stockings every night before going to bed and put on every morning when getting up. - right leg Elevate legs to the level of the heart and pump ankles as often as possible Elevate leg(s) parallel to the floor when sitting. DO YOUR BEST to sleep in the bed at night. DO NOT sleep in your recliner. Long hours of sitting in a recliner leads to swelling of the legs and/or potential wounds on your backside. Off-Loading: Other: - Offloading boots while in bed; Try pillow under sheet to keep it in place. WOUND #3: - Foot Wound Laterality: Left Cleanser: Soap and Water 1 x Per Week/30 Days Discharge Instructions: Gently cleanse wound with  antibacterial soap, rinse and pat dry prior to dressing wounds Primary Dressing: Hydrofera Blue Ready Transfer Foam, 2.5x2.5 (in/in) 1 x Per Week/30 Days Discharge Instructions: Apply Hydrofera Blue Ready to wound bed as directed Secondary Dressing: ABD Pad 5x9 (in/in) 1 x Per Week/30 Days Discharge Instructions: Cover with ABD pad Compression Wrap: Profore Lite LF 3 Multilayer Compression Bandaging System 1 x Per Week/30 Days Discharge Instructions: Apply 3 multi-layer wrap as prescribed. 1. Mother suggest that we go ahead and continue with the wound care measures as before and the patient is in agreement of plan as is her daughter this is the [facility] which I think is doing an awesome job. 2. I am also can recommend that we continue with the ABD pad to cover followed by 3 layer compression wrap. Bauserman, Elsie L. ([id number]) We will see patient back for reevaluation in 1 week here in the clinic. If anything worsens or changes patient will contact our office for additional recommendations. Electronic Signature(s) Signed: 11/20/2020 1:28:03 PM By: Worthy Keeler PA-C Entered By: Worthy Keeler on 11/20/2020 13:28:03 Puzzo, Leotta L. ([id number]) -------------------------------------------------------------------------------- SuperBill Details Patient Name: Grall, Jaquanda L. Date of Service: 11/20/2020 Medical Record Number: [id number] Patient Account Number: [id number] Date of Birth/Sex: Oct 07, 1926 (87 y.o. F) Treating RN: Donnamarie Poag Primary Care Provider: [name] Other Clinician: Referring Provider: [name] Treating Provider/Extender: Skipper Cliche in Treatment: 21 Diagnosis Coding ICD-10 Codes Code Description [phone] Pressure ulcer of left heel, stage 3 L89.893 Pressure ulcer of other site, stage 3 I10 Essential (primary) hypertension F01.50 Vascular dementia without behavioral disturbance I25.10 Atherosclerotic heart disease of native coronary artery  without angina pectoris Facility Procedures CPT4 Code: [id number] Description: [id number] - DEB SUBQ TISSUE 20 SQ CM/< Modifier: Quantity: 1 CPT4 Code: Description: ICD-10 Diagnosis Description L89.893 Pressure ulcer of other site, stage 3 Modifier: Quantity: Physician Procedures CPT4 Code: [id number] Description: 11042 - WC PHYS SUBQ TISS 20 SQ CM Modifier: Quantity: 1 CPT4 Code: Description: ICD-10 Diagnosis Description L89.893 Pressure ulcer of other site, stage 3 Modifier: Quantity: Electronic Signature(s) Signed: 11/20/2020 1:28:13 PM By: Worthy Keeler PA-C Entered By: Worthy Keeler on 11/20/2020 13:28:13

## 2020-11-21 NOTE — Progress Notes (Addendum)
Nieland, Jamille L. ([id number]) Visit Report for 11/20/2020 Arrival Information Details Patient Name: [name] [name], [name] Date of Service: 11/20/2020 11:30 AM Medical Record Number: [id number] Patient Account Number: [id number] Date of Birth/Sex: September 06, 1926 (87 y.o. F) Treating RN: Carlene Coria Primary Care Louay Myrie: [name] Other Clinician: Referring Bennie Chirico: [name] Treating Paco Cislo/Extender: Skipper Cliche in Treatment: 21 Visit Information History Since Last Visit All ordered tests and consults were completed: No Patient Arrived: Wheel Chair Added or deleted any medications: No Arrival Time: 11:40 Any new allergies or adverse reactions: No Accompanied By: daughter Had a fall or experienced change in No Transfer Assistance: None activities of daily living that may affect Patient Identification Verified: Yes risk of falls: Secondary Verification Process Completed: Yes Signs or symptoms of abuse/neglect since last visito No Patient Requires Transmission-Based No Hospitalized since last visit: No Precautions: Implantable device outside of the clinic excluding No Patient Has Alerts: Yes cellular tissue based products placed in the center Patient Alerts: NOT diabetic since last visit: ABI R1.18 L1.25 Has Dressing in Place as Prescribed: Yes 06/27/20 Has Compression in Place as Prescribed: Yes Pain Present Now: No Electronic Signature(s) Signed: 11/23/2020 12:57:29 PM By: Carlene Coria RN Entered By: Carlene Coria on 11/20/2020 11:44:42 Heaphy, Sherrica L. ([id number]) -------------------------------------------------------------------------------- Clinic Level of Care Assessment Details Patient Name: [name] [name], [name] Date of Service: 11/20/2020 11:30 AM Medical Record Number: [id number] Patient Account Number: [id number] Date of Birth/Sex: 1926/05/25 (87 y.o. F) Treating RN: Donnamarie Poag Primary Care Trigger Frasier: [name] Other Clinician: Referring Ozias Dicenzo:  [name] Treating Chinmay Squier/Extender: Skipper Cliche in Treatment: 21 Clinic Level of Care Assessment Items TOOL 1 Quantity Score [] - Use when EandM and Procedure is performed on INITIAL visit 0 ASSESSMENTS - Nursing Assessment / Reassessment [] - General Physical Exam (combine w/ comprehensive assessment (listed just below) when performed on new 0 pt. evals) [] - 0 Comprehensive Assessment (HX, ROS, Risk Assessments, Wounds Hx, etc.) ASSESSMENTS - Wound and Skin Assessment / Reassessment [] - Dermatologic / Skin Assessment (not related to wound area) 0 ASSESSMENTS - Ostomy and/or Continence Assessment and Care [] - Incontinence Assessment and Management 0 [] - 0 Ostomy Care Assessment and Management (repouching, etc.) PROCESS - Coordination of Care [] - Simple Patient / Family Education for ongoing care 0 [] - 0 Complex (extensive) Patient / Family Education for ongoing care [] - 0 Staff obtains Programmer, systems, Records, Test Results / Process Orders [] - 0 Staff telephones HHA, Nursing Homes / Clarify orders / etc [] - 0 Routine Transfer to another Facility (non-emergent condition) [] - 0 Routine Hospital Admission (non-emergent condition) [] - 0 New Admissions / Biomedical engineer / Ordering NPWT, Apligraf, etc. [] - 0 Emergency Hospital Admission (emergent condition) PROCESS - Special Needs [] - Pediatric / Minor Patient Management 0 [] - 0 Isolation Patient Management [] - 0 Hearing / Language / Visual special needs [] - 0 Assessment of Community assistance (transportation, D/C planning, etc.) [] - 0 Additional assistance / Altered mentation [] - 0 Support Surface(s) Assessment (bed, cushion, seat, etc.) INTERVENTIONS - Miscellaneous [] - External ear exam 0 [] - 0 Patient Transfer (multiple staff / Civil Service fast streamer / Similar devices) [] - 0 Simple Staple / Suture removal (25 or less) [] - 0 Complex Staple / Suture removal (26 or more) [] -  0 Hypo/Hyperglycemic Management (do not check if billed separately) [] - 0 Ankle / Brachial Index (ABI) - do not check if billed separately Has the  patient been seen at the hospital within the last three years: Yes Total Score: 0 Level Of Care: ____ Rossini, Suman L. ([id number]) Electronic Signature(s) Signed: 11/20/2020 2:08:39 PM By: Donnamarie Poag Entered By: Donnamarie Poag on 11/20/2020 12:03:13 Worst, Ada ([id number]) -------------------------------------------------------------------------------- Compression Therapy Details Patient Name: [name] [name], [name] Date of Service: 11/20/2020 11:30 AM Medical Record Number: [id number] Patient Account Number: [id number] Date of Birth/Sex: [birth year] (87 y.o. F) Treating RN: Carlene Coria Primary Care : [name] Other Clinician: Referring : [name] Treating /Extender: Skipper Cliche in Treatment: 21 Compression Therapy Performed for Wound Assessment: Wound #3 Left Foot Performed By: Clinician Carlene Coria, RN Compression Type: Three Layer Post Procedure Diagnosis Same as Pre-procedure Electronic Signature(s) Signed: 11/23/2020 12:57:29 PM By: Carlene Coria RN Entered By: Carlene Coria on 11/20/2020 11:56:52 [name] [name], [name] ([id number]) -------------------------------------------------------------------------------- Encounter Discharge Information Details Patient Name: [name] [name], [name] [name] L. Date of Service: 11/20/2020 11:30 AM Medical Record Number: [id number] Patient Account Number: [id number] Date of Birth/Sex: 09/23/1926 (87 y.o. F) Treating RN: Donnamarie Poag Primary Care : [name] Other Clinician: Referring : [name] Treating /Extender: Skipper Cliche in Treatment: 21 Encounter Discharge Information Items Post Procedure Vitals Discharge Condition: Stable Temperature (F): 97.5 Ambulatory Status: Wheelchair Pulse (bpm): 93 Discharge Destination:  Home Respiratory Rate (breaths/min): 18 Transportation: Private Auto Blood Pressure (mmHg): 154/54 Accompanied By: daughter Schedule Follow-up Appointment: Yes Clinical Summary of Care: Electronic Signature(s) Signed: 11/20/2020 2:08:39 PM By: Donnamarie Poag Entered ByDonnamarie Poag on 11/20/2020 12:04:15 Oltmann, Atasha L. ([id number]) -------------------------------------------------------------------------------- Lower Extremity Assessment Details Patient Name: Tarpley, Oneita L. Date of Service: 11/20/2020 11:30 AM Medical Record Number: [id number] Patient Account Number: [id number] Date of Birth/Sex: [birth year] (87 y.o. F) Treating RN: Carlene Coria Primary Care : [name] Other Clinician: Referring : [name] Treating /Extender: Skipper Cliche in Treatment: 21 Edema Assessment Assessed: [Left: No] [Right: No] Edema: [Left: Ye] [Right: s] Calf Left: Right: Point of Measurement: 32 cm From Medial Instep 32 cm Ankle Left: Right: Point of Measurement: 12 cm From Medial Instep 21.5 cm Knee To Floor Left: Right: From Medial Instep 40 cm Electronic Signature(s) Signed: 11/23/2020 12:57:29 PM By: Carlene Coria RN Entered By: Carlene Coria on 11/20/2020 11:52:47 Rondeau, Azusena L. ([id number]) -------------------------------------------------------------------------------- Multi Wound Chart Details Patient Name: [name] [name], [name] Date of Service: 11/20/2020 11:30 AM Medical Record Number: [id number] Patient Account Number: [id number] Date of Birth/Sex: [birth year] (87 y.o. F) Treating RN: Carlene Coria Primary Care : [name] Other Clinician: Referring : [name] Treating /Extender: Skipper Cliche in Treatment: 21 Vital Signs Height(in): 83 Pulse(bpm): 22 Weight(lbs): 137 Blood Pressure(mmHg): 154/74 Body Mass Index(BMI): 29 Temperature(F): 97.5 Respiratory Rate(breaths/min): 18 Photos:  [N/A:N/A] Wound Location: Left Foot N/A N/A Wounding Event: Pressure Injury N/A N/A Primary Etiology: Pressure Ulcer N/A N/A Comorbid History: Cataracts, Anemia, Coronary Artery N/A N/A Disease, Hypertension, Myocardial Infarction, End Stage Renal Disease, Gout, Osteoarthritis, Dementia Date Acquired: 10/02/2020 N/A N/A Weeks of Treatment: 7 N/A N/A Wound Status: Open N/A N/A Measurements L x W x D (cm) 0.4x0.6x0.3 N/A N/A Area (cm) : 0.188 N/A N/A Volume (cm) : 0.057 N/A N/A % Reduction in Area: 40.10% N/A N/A % Reduction in Volume: -83.90% N/A N/A Classification: Category/Stage III N/A N/A Exudate Amount: Medium N/A N/A Exudate Type: Serosanguineous N/A N/A Exudate Color: red, brown N/A N/A Wound Margin: Thickened N/A N/A Granulation Amount: Large (67-100%) N/A N/A Granulation Quality: Red, Pink N/A N/A Necrotic Amount: Small (1-33%) N/A N/A Exposed Structures: Fat Layer (Subcutaneous  Tissue): N/A N/A Yes Fascia: No Tendon: No Muscle: No Joint: No Bone: No Treatment Notes Electronic Signature(s) Signed: 11/23/2020 12:57:29 PM By: Carlene Coria RN Entered By: Carlene Coria on 11/20/2020 11:56:31 [name] [name], [name] ([id number]) -------------------------------------------------------------------------------- [place] Details Patient Name: [name] [name], [name] Date of Service: 11/20/2020 11:30 AM Medical Record Number: [id number] Patient Account Number: [id number] Date of Birth/Sex: Jun 27, 1926 (87 y.o. F) Treating RN: Carlene Coria Primary Care : [name] Other Clinician: Referring : [name] Treating /Extender: Skipper Cliche in Treatment: 21 Active Inactive Wound/Skin Impairment Nursing Diagnoses: Impaired tissue integrity Goals: Patient/caregiver will verbalize understanding of skin care regimen Date Initiated: 06/26/2020 Date Inactivated: 08/21/2020 Target Resolution Date: 06/26/2020 Goal Status: Met Ulcer/skin  breakdown will have a volume reduction of 30% by week 4 Date Initiated: 06/26/2020 Date Inactivated: 08/21/2020 Target Resolution Date: 07/27/2020 Goal Status: Met Ulcer/skin breakdown will have a volume reduction of 50% by week 8 Date Initiated: 06/26/2020 Date Inactivated: 09/18/2020 Target Resolution Date: 08/26/2020 Goal Status: Unmet Unmet Reason: con't tx Ulcer/skin breakdown will have a volume reduction of 80% by week 12 Date Initiated: 06/26/2020 Date Inactivated: 10/30/2020 Target Resolution Date: 09/26/2020 Goal Status: Met Ulcer/skin breakdown will heal within 14 weeks Date Initiated: 06/26/2020 Target Resolution Date: 10/27/2020 Goal Status: Active Interventions: Assess patient/caregiver ability to obtain necessary supplies Assess patient/caregiver ability to perform ulcer/skin care regimen upon admission and as needed Assess ulceration(s) every visit Provide education on ulcer and skin care Treatment Activities: Referred to DME  for dressing supplies : 06/26/2020 Skin care regimen initiated : 06/26/2020 Notes: Electronic Signature(s) Signed: 11/23/2020 12:57:29 PM By: Carlene Coria RN Entered By: Carlene Coria on 11/20/2020 11:56:23 [name] [name], [name] ([id number]) -------------------------------------------------------------------------------- Pain Assessment Details Patient Name: [name] [name], [name] Date of Service: 11/20/2020 11:30 AM Medical Record Number: [id number] Patient Account Number: [id number] Date of Birth/Sex: 1926-07-16 (87 y.o. F) Treating RN: Carlene Coria Primary Care : [name] Other Clinician: Referring : [name] Treating /Extender: Skipper Cliche in Treatment: 21 Active Problems Location of Pain Severity and Description of Pain Patient Has Paino No Site Locations Pain Management and Medication Current Pain Management: Electronic Signature(s) Signed: 11/23/2020 12:57:29 PM By: Carlene Coria RN Entered By: Carlene Coria on 11/20/2020 11:45:13 [name] [name], [name] ([id number]) -------------------------------------------------------------------------------- Patient/Caregiver Education Details Patient Name: [name] [name], [name] Date of Service: 11/20/2020 11:30 AM Medical Record Number: [id number] Patient Account Number: [id number] Date of Birth/Gender: December 01, 1926 (87 y.o. F) Treating RN: Donnamarie Poag Primary Care Physician: [name] Other Clinician: Referring Physician: [name] Treating Physician/Extender: Skipper Cliche in Treatment: 21 Education Assessment Education Provided To: Caregiver Education Topics Provided Wound Debridement: Wound/Skin Impairment: Electronic Signature(s) Signed: 11/20/2020 2:08:39 PM By: Donnamarie Poag Entered ByDonnamarie Poag on 11/20/2020 12:03:34 [name] [name], [name] ([id number]) -------------------------------------------------------------------------------- Wound Assessment Details Patient Name: [name] [name], [name] Date of Service: 11/20/2020 11:30 AM Medical Record Number: [id number] Patient Account Number: [id number] Date of Birth/Sex: 08/21/1926 (87 y.o. F) Treating RN: Carlene Coria Primary Care : [name] Other Clinician: Referring : [name] Treating /Extender: Skipper Cliche in Treatment: 21 Wound Status Wound Number: 3 Primary Pressure Ulcer Etiology: Wound Location: Left Foot Wound Open Wounding Event: Pressure Injury Status: Date Acquired: 10/02/2020 Comorbid Cataracts, Anemia, Coronary Artery Disease, Weeks Of Treatment: 7 History: Hypertension, Myocardial Infarction, End Stage Renal Clustered Wound: No Disease, Gout, Osteoarthritis, Dementia Photos Wound Measurements Length: (cm) 0.4 Width: (cm) 0.6 Depth: (cm) 0.3 Area: (cm) 0.188 Volume: (cm) 0.057 % Reduction in Area: 40.1% % Reduction in  Volume: -83.9% Tunneling: No Undermining: No Wound Description Classification: Category/Stage  III Wound Margin: Thickened Exudate Amount: Medium Exudate Type: Serosanguineous Exudate Color: red, brown Foul Odor After Cleansing: No Slough/Fibrino Yes Wound Bed Granulation Amount: Large (67-100%) Exposed Structure Granulation Quality: Red, Pink Fascia Exposed: No Necrotic Amount: Small (1-33%) Fat Layer (Subcutaneous Tissue) Exposed: Yes Necrotic Quality: Adherent Slough Tendon Exposed: No Muscle Exposed: No Joint Exposed: No Bone Exposed: No Treatment Notes Wound #3 (Foot) Wound Laterality: Left Cleanser Soap and Water Discharge Instruction: Gently cleanse wound with antibacterial soap, rinse and pat dry prior to dressing wounds Rinker, Camrie L. ([id number]) Peri-Wound Care Topical Primary Dressing Hydrofera Blue Ready Transfer Foam, 2.5x2.5 (in/in) Discharge Instruction: Apply Hydrofera Blue Ready to wound bed as directed Secondary Dressing ABD Pad 5x9 (in/in) Discharge Instruction: Cover with ABD pad Secured With Compression Wrap Profore Lite LF 3 Multilayer Compression Superior Discharge Instruction: Apply 3 multi-layer wrap as prescribed. Compression Stockings Add-Ons Electronic Signature(s) Signed: 11/23/2020 12:57:29 PM By: Carlene Coria RN Entered By: Carlene Coria on 11/20/2020 11:51:45 Torrico, Ranya L. ([id number]) -------------------------------------------------------------------------------- Vitals Details Patient Name: Brougham, Diasha L. Date of Service: 11/20/2020 11:30 AM Medical Record Number: [id number] Patient Account Number: [id number] Date of Birth/Sex: 03/03/1926 (87 y.o. F) Treating RN: Carlene Coria Primary Care Burnetta Kohls: [name] Other Clinician: Referring Sherin Murdoch: [name] Treating Aroldo Galli/Extender: Skipper Cliche in Treatment: 21 Vital Signs Time Taken: 11:44 Temperature (F): 97.5 Height (in): 58 Pulse (bpm): 93 Weight (lbs): 137 Respiratory Rate (breaths/min): 18 Body Mass Index (BMI): 28.6 Blood  Pressure (mmHg): 154/74 Reference Range: 80 - 120 mg / dl Electronic Signature(s) Signed: 11/23/2020 12:57:29 PM By: Carlene Coria RN Entered By: Carlene Coria on 11/20/2020 11:45:04

## 2020-11-27 ENCOUNTER — Encounter: Payer: PPO | Admitting: Internal Medicine

## 2020-11-27 ENCOUNTER — Other Ambulatory Visit: Payer: Self-pay

## 2020-11-27 ENCOUNTER — Encounter: Payer: PPO | Admitting: Physician Assistant

## 2020-11-27 DIAGNOSIS — L89893 Pressure ulcer of other site, stage 3: Secondary | ICD-10-CM | POA: Diagnosis not present

## 2020-11-27 NOTE — Progress Notes (Signed)
[name], [name] ([id number]) Visit Report for 11/27/2020 Debridement Details Patient Name: [name], [name] Date of Service: 11/27/2020 1:45 PM Medical Record Number: [id number] Patient Account Number: [id number] Date of Birth/Sex: 1926/08/06 (87 y.o. F) Treating RN: Donnamarie Poag Primary Care Provider: [name] Other Clinician: Referring Provider: [name] Treating Provider/Extender: Tito Dine in Treatment: 22 Debridement Performed for Wound #3 Left Foot Assessment: Performed By: Physician Ricard Dillon, MD Debridement Type: Debridement Level of Consciousness (Pre- Awake and Alert procedure): Pre-procedure Verification/Time Out Yes - 15:59 Taken: Start Time: 15:59 Pain Control: Lidocaine Total Area Debrided (L x W): 0.5 (cm) x 0.6 (cm) = 0.3 (cm) Tissue and other material Viable, Non-Viable, Callus, Subcutaneous debrided: Level: Skin/Subcutaneous Tissue Debridement Description: Excisional Instrument: Curette Bleeding: Minimum Hemostasis Achieved: Pressure Response to Treatment: Procedure was tolerated well Level of Consciousness (Post- Awake and Alert procedure): Post Debridement Measurements of Total Wound Length: (cm) 0.5 Stage: Category/Stage III Width: (cm) 0.5 Depth: (cm) 0.4 Volume: (cm) 0.079 Character of Wound/Ulcer Post Debridement: Improved Post Procedure Diagnosis Same as Pre-procedure Electronic Signature(s) Signed: 11/27/2020 2:36:26 PM By: Donnamarie Poag Signed: 11/27/2020 3:54:48 PM By: Linton Ham MD Entered By: Linton Ham on 11/27/2020 14:10:19 [name], [name] ([id number]) -------------------------------------------------------------------------------- HPI Details Patient Name: [name], [name] Date of Service: 11/27/2020 1:45 PM Medical Record Number: [id number] Patient Account Number: [id number] Date of Birth/Sex: [birth year] (87 y.o. F) Treating RN: Donnamarie Poag Primary Care Provider: [name]  Other Clinician: Referring Provider: [name] Treating Provider/Extender: Tito Dine in Treatment: 42 History of Present Illness HPI Description: 87 year old patient was recently seen by the PCPs office for significant pain right great toe which has been going on since July. She was initially treated with Keflex which she did not complete and after the office visit this time she has been put on doxycycline. at the time of her visit she was found to have a ulcer on the plantar surface of the right great toe and also had a pyogenic granuloma over this area. X-ray of the right foot done 12/29/2014 -- IMPRESSION:Soft-tissue swelling and ulceration right great toe. No underlying bony lytic lesion identified. If osteomyelitis remains of clinical concern MRI can be obtained. Past medical history significant for anemia, chronic kidney disease stage III, obesity, varicose veins, coronary artery disease, gout, history of nicotine addiction given up smoking in 2002, hypertension, status post cardiac catheterization, status post abdominal hysterectomy, cholecystectomy and tonsillectomy. hemoglobin A1c done in August was 5.8 01/22/2015 -- at this stage the [facility] Walking boat was going to cost them significant amount of money and they want to defer using that at the present time. 01/29/2015 -- she had a podiatry appointment and they have trimmed her toenails. She has not heard back from the vascular office regarding her venous duplex study and I have asked them to call personally so that they can get the appointment soon. 02/06/2015 -- they have made contact with the vascular office and from what I understand that test has been done but the report is pending. 02/19/2014 -- the vascular test is scheduled for tomorrow Readmission: 06/26/2020 upon evaluation today patient appears for initial evaluation in her clinic that she has been here before in 2016 and has been quite sometime. She did have a  fractured hip in February on the 23rd 2022. Subsequently this had to be pinned and she ended up with a pressure injury on her heel following when she was using her foot to help move her around in  the bed. Subsequently this has led to the wound that she has been dealing with since that time. She lives at home with her daughter currently she does have dementia. 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 07/03/2020 upon evaluation today patient appears to be doing well with regard to her heel ulcer. She did have arterial studies they appear to be doing excellent it was premature normal across the board with TBI's in the 90s and ABIs well within normal range. Nonetheless there does not appear to be any signs of arterial insufficiency whatsoever. With that being said the patient does have also signs of improvement there is some necrotic tissue in the base of the wound number to try to clear some of that away today I do believe the Iodoflex/Iodosorb is doing well. 5/24; difficult punched out wound on the left medial heel. She has been using Iodoflex 07/17/2020 upon evaluation today patient appears to be doing well at this point in regard to her wound. I do feel like this is a little bit deeper but again that is what is expected as we continue with the Iodoflex I think that this is just going to get deeper until we get to the base of the wound. With that being said I think we are getting much closer to the base of the wound where we can have healthier tissue that we get a be managing here which that will be awesome. In the meantime I am not surprised by what I am seeing and in fact the wound appears to be better as compared to previous findings. 07/24/2020 upon evaluation today patient appears to be doing well with regard to her wound. Overall I am extremely pleased with where  things stand today. I do not see any signs of active infection which is great and overall I think that the patient is making good progress. There is good to be some need for sharp debridement today. 07/31/2020 upon evaluation today patient appears to be doing better in regard to her heel ulcer. She has been tolerating the dressing changes without complication. Fortunately there does not appear to be any signs of active infection which is great and overall very pleased in this regard. No fevers, chills, nausea, vomiting, or diarrhea. 08/14/2020 upon evaluation today patient appears to be doing better in regard to her heel ulcer. I am very pleased in that regard. Unfortunately she still has a slight deep tissue injury in regard to the medial portion of her foot over the same area. Unfortunately I think this is something that if were not careful it was can open up into the wound. That is my main concern here based on what I see. Fortunately there does not appear to be any evidence of active infection which is great news and overall very pleased with where things stand at this point. 08/21/2020 upon evaluation today patient appears to be doing well with regard to her wound. She has been tolerating the dressing changes without complication. Fortunately there does not appear to be any signs of active infection at this time. No fevers, chills, nausea, vomiting, or diarrhea. I do believe the compression wrap was beneficial for her. 08/28/2020 upon evaluation today patient appears to be doing well with regard to her wounds currently. Fortunately there does not appear to be any signs of active infection at this time. No fevers, chills, nausea, vomiting, or  diarrhea. With that being said I think that her leg is doing quite well to be honest. 09/04/2020 upon evaluation today patient appears to be doing well with regard to her wound on the heel. This is showing signs of good epithelial growth although there is  probably can it be an indention where this heals that is okay as long as we get it closed. Fortunately I do not see any evidence of infection at this point. 09/13/2020 upon evaluation today patient appears to be doing well with regard to her wounds. She has been tolerating the dressing changes Lewis, Yarethzi L. ([id number]) without complication. Fortunately he is actually doing extremely well and I think she is making great progress this is measuring smaller. I would recommend that such that we continue with the [facility] likely since he is doing so well. 09/18/2020 upon evaluation today patient appears to be doing well with regard to her heel ulcer. She is making good progress and I am very pleased with what we see today. I think the Hydrofera Blue is still doing excellent. 10/02/2020 upon evaluation today patient's wound actually appears to be showing signs of good improvement in regard to the heel. I am very pleased in this regard. With that being said I do believe that the area which is somewhat been stable and dry is starting to lift up and beginning to clear this away as well that is on the foot. Subsequently I am going to have to perform some debridement here and we did actually go ahead and allow this as a wound today as before it was just more deep tissue injury and eschar covering now I think it is a little bit more than that to be honest. 10/09/2020 upon evaluation today patient appears to be doing decently well in regard to her wounds. I am actually very pleased with where things stand today. There does not appear to be any signs of active infection which is great news. No fevers, chills, nausea, vomiting, or diarrhea. She is going require some sharp debridement today. 10/16/2020 upon evaluation today patient appears to be doing decently well in regard to her heel as well as the foot. Both are showing signs of good improvement which is great news. In general and extremely pleased with  where things stand at this point. She is tolerating the [facility] well although this is getting so small in the heel I think collagen may be a better option here based on what I am seeing. With regard to the foot the Iodoflex/Iodosorb is still probably the best method here. 10/26/2020 upon evaluation today patient actually appears to be doing well in some respects today. She has been tolerating the dressing changes without complication and this is great news. The Hydrofera Blue is done well for the heel this actually appears to be healed today. With that being said in regard to the foot I think we are ready to switch to [facility] based on what I see at this point. 10/30/2020 upon evaluation today patient appears to be doing well with regard to his wound. He has been tolerating the dressing changes without complication. Fortunately there does not appear to be any signs of active infection systemically at this time which is great news and overall very pleased with where things stand today. I do think that she is making progress although it somewhat slow still where showing signs of improvement little by little here. 11/06/2020 upon evaluation today patient appears to be doing decently well  in regard to her wound. We will start and see better overall appearance of the base of the wound which is great news she still continues to have some abnormal fluorescence signals with a MolecuLight DX that will be detailed below. 11/20/2020 upon evaluation today patient's wound actually showing signs of improvement. There is good to be some need for sharp debridement today and that was discussed with the patient. I am going to go ahead and clean this up but I do believe the [facility] is doing a great job. 11/27/2020; the patient had eschar over the original heel wound which I removed with a #3 curette there was nothing open here. The area is on the lateral left foot foot. Electronic Signature(s) Signed:  11/27/2020 3:54:48 PM By: Linton Ham MD Entered By: Linton Ham on 11/27/2020 14:12:25 [name] [name], [name] [name] ([id number]) -------------------------------------------------------------------------------- Physical Exam Details Patient Name: [name], [name] Date of Service: 11/27/2020 1:45 PM Medical Record Number: [id number] Patient Account Number: [id number] Date of Birth/Sex: 03-29-1926 (87 y.o. F) Treating RN: Donnamarie Poag Primary Care Provider: [name] Other Clinician: Referring Provider: [name] Treating Provider/Extender: Tito Dine in Treatment: 22 Constitutional Sitting or standing Blood Pressure is within target range for patient.. Pulse regular and within target range for patient.Marland Kitchen Respirations regular, non- labored and within target range.. Temperature is normal and within the target range for the patient.Marland Kitchen appears in no distress. Notes Wound exam; lateral left foot. 3 mm of circumferential undermining. I used a #3 curette to remove the overhanging skin. The granulation tissue actually looks fairly healthy. Hemostasis with a pressure dressing o The area on the left heel under the eschar I removed today is also closed. Electronic Signature(s) Signed: 11/27/2020 3:54:48 PM By: Linton Ham MD Entered By: Linton Ham on 11/27/2020 14:13:33 Ringer, [place] ([id number]) -------------------------------------------------------------------------------- Physician Orders Details Patient Name: [name], [name] Date of Service: 11/27/2020 1:45 PM Medical Record Number: [id number] Patient Account Number: [id number] Date of Birth/Sex: Oct 21, 1926 (87 y.o. F) Treating RN: Donnamarie Poag Primary Care Provider: [name] Other Clinician: Referring Provider: [name] Treating Provider/Extender: Tito Dine in Treatment: 72 Verbal / Phone Orders: No Diagnosis Coding Follow-up Appointments o Return Appointment in 1 week. o  Nurse Visit as needed Bathing/ Shower/ Hygiene o May shower with wound dressing protected with water repellent cover or cast protector. o No tub bath. Edema Control - Lymphedema / Segmental Compressive Device / Other o Patient to wear own compression stockings. Remove compression stockings every night before going to bed and put on every morning when getting up. - right leg o Elevate legs to the level of the heart and pump ankles as often as possible o Elevate leg(s) parallel to the floor when sitting. o DO YOUR BEST to sleep in the bed at night. DO NOT sleep in your recliner. Long hours of sitting in a recliner leads to swelling of the legs and/or potential wounds on your backside. Off-Loading o Other: - Offloading boots while in bed; Try pillow under sheet to keep it in place. Wound Treatment Wound #3 - Foot Wound Laterality: Left Cleanser: Soap and Water 1 x Per Week/30 Days Discharge Instructions: Gently cleanse wound with antibacterial soap, rinse and pat dry prior to dressing wounds Primary Dressing: Endoform 2x2 (in/in) 1 x Per Week/30 Days Discharge Instructions: Apply Endoform as directed moisten with hydroge Secondary Dressing: ABD Pad 5x9 (in/in) 1 x Per Week/30 Days Discharge Instructions: Cover with ABD pad Compression Wrap: Profore Lite LF 3  Multilayer Compression Bandaging System 1 x Per Week/30 Days Discharge Instructions: Apply 3 multi-layer wrap as prescribed. Electronic Signature(s) Signed: 11/27/2020 2:36:26 PM By: Donnamarie Poag Signed: 11/27/2020 3:54:48 PM By: Linton Ham MD Entered By: Donnamarie Poag on 11/27/2020 14:08:07 Kappes, [place] ([id number]) -------------------------------------------------------------------------------- Problem List Details Patient Name: Delk, Mianna L. Date of Service: 11/27/2020 1:45 PM Medical Record Number: [id number] Patient Account Number: [id number] Date of Birth/Sex: 1926-12-29 (87 y.o. F) Treating RN:  Donnamarie Poag Primary Care Provider: [name] Other Clinician: Referring Provider: [name] Treating Provider/Extender: Tito Dine in Treatment: 22 Active Problems ICD-10 Encounter Code Description Active Date MDM Diagnosis L89.893 Pressure ulcer of other site, stage 3 10/02/2020 No Yes I10 Essential (primary) hypertension 06/26/2020 No Yes F01.50 Vascular dementia without behavioral disturbance 06/26/2020 No Yes I25.10 Atherosclerotic heart disease of native coronary artery without angina 06/26/2020 No Yes pectoris Inactive Problems Resolved Problems ICD-10 Code Description Active Date Resolved Date L89.623 Pressure ulcer of left heel, stage 3 06/26/2020 06/26/2020 Electronic Signature(s) Signed: 11/27/2020 3:54:48 PM By: Linton Ham MD Entered By: Linton Ham on 11/27/2020 14:09:49 [name], [name] ([id number]) -------------------------------------------------------------------------------- Progress Note Details Patient Name: Cavalieri, Shayne L. Date of Service: 11/27/2020 1:45 PM Medical Record Number: [id number] Patient Account Number: [id number] Date of Birth/Sex: 1926/12/09 (87 y.o. F) Treating RN: Donnamarie Poag Primary Care Provider: [name] Other Clinician: Referring Provider: [name] Treating Provider/Extender: Tito Dine in Treatment: 22 Subjective History of Present Illness (HPI) 87 year old patient was recently seen by the PCPs office for significant pain right great toe which has been going on since July. She was initially treated with Keflex which she did not complete and after the office visit this time she has been put on doxycycline. at the time of her visit she was found to have a ulcer on the plantar surface of the right great toe and also had a pyogenic granuloma over this area. X-ray of the right foot done 12/29/2014 -- IMPRESSION:Soft-tissue swelling and ulceration right great toe. No underlying bony lytic  lesion identified. If osteomyelitis remains of clinical concern MRI can be obtained. Past medical history significant for anemia, chronic kidney disease stage III, obesity, varicose veins, coronary artery disease, gout, history of nicotine addiction given up smoking in 2002, hypertension, status post cardiac catheterization, status post abdominal hysterectomy, cholecystectomy and tonsillectomy. hemoglobin A1c done in August was 5.8 01/22/2015 -- at this stage the [facility] Walking boat was going to cost them significant amount of money and they want to defer using that at the present time. 01/29/2015 -- she had a podiatry appointment and they have trimmed her toenails. She has not heard back from the vascular office regarding her venous duplex study and I have asked them to call personally so that they can get the appointment soon. 02/06/2015 -- they have made contact with the vascular office and from what I understand that test has been done but the report is pending. 02/19/2014 -- the vascular test is scheduled for tomorrow Readmission: 06/26/2020 upon evaluation today patient appears for initial evaluation in her clinic that she has been here before in 2016 and has been quite sometime. She did have a fractured hip in February on the 23rd 2022. Subsequently this had to be pinned and she ended up with a pressure injury on her heel following when she was using her foot to help move her around in the bed. Subsequently this has led to the wound that she has been dealing with since that time.  She lives at home with her daughter currently she does have dementia. 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 07/03/2020 upon evaluation today patient appears to be doing well with regard to her heel ulcer. She did have arterial studies they appear to be doing excellent it was premature  normal across the board with TBI's in the 90s and ABIs well within normal range. Nonetheless there does not appear to be any signs of arterial insufficiency whatsoever. With that being said the patient does have also signs of improvement there is some necrotic tissue in the base of the wound number to try to clear some of that away today I do believe the Iodoflex/Iodosorb is doing well. 5/24; difficult punched out wound on the left medial heel. She has been using Iodoflex 07/17/2020 upon evaluation today patient appears to be doing well at this point in regard to her wound. I do feel like this is a little bit deeper but again that is what is expected as we continue with the Iodoflex I think that this is just going to get deeper until we get to the base of the wound. With that being said I think we are getting much closer to the base of the wound where we can have healthier tissue that we get a be managing here which that will be awesome. In the meantime I am not surprised by what I am seeing and in fact the wound appears to be better as compared to previous findings. 07/24/2020 upon evaluation today patient appears to be doing well with regard to her wound. Overall I am extremely pleased with where things stand today. I do not see any signs of active infection which is great and overall I think that the patient is making good progress. There is good to be some need for sharp debridement today. 07/31/2020 upon evaluation today patient appears to be doing better in regard to her heel ulcer. She has been tolerating the dressing changes without complication. Fortunately there does not appear to be any signs of active infection which is great and overall very pleased in this regard. No fevers, chills, nausea, vomiting, or diarrhea. 08/14/2020 upon evaluation today patient appears to be doing better in regard to her heel ulcer. I am very pleased in that regard. Unfortunately she still has a slight deep tissue  injury in regard to the medial portion of her foot over the same area. Unfortunately I think this is something that if were not careful it was can open up into the wound. That is my main concern here based on what I see. Fortunately there does not appear to be any evidence of active infection which is great news and overall very pleased with where things stand at this point. 08/21/2020 upon evaluation today patient appears to be doing well with regard to her wound. She has been tolerating the dressing changes without complication. Fortunately there does not appear to be any signs of active infection at this time. No fevers, chills, nausea, vomiting, or diarrhea. I do believe the compression wrap was beneficial for her. 08/28/2020 upon evaluation today patient appears to be doing well with regard to her wounds currently. Fortunately there does not appear to be any signs of active infection at this time. No fevers, chills, nausea, vomiting, or diarrhea. With that being said I think that her leg is doing quite well to be honest.  09/04/2020 upon evaluation today patient appears to be doing well with regard to her wound on the heel. This is showing signs of good epithelial growth although there is probably can it be an indention where this heals that is okay as long as we get it closed. Fortunately I do not see any evidence of infection at this point. 09/13/2020 upon evaluation today patient appears to be doing well with regard to her wounds. She has been tolerating the dressing changes [name], [name] ([id number]) without complication. Fortunately he is actually doing extremely well and I think she is making great progress this is measuring smaller. I would recommend that such that we continue with the [facility] likely since he is doing so well. 09/18/2020 upon evaluation today patient appears to be doing well with regard to her heel ulcer. She is making good progress and I am very pleased with what we  see today. I think the Hydrofera Blue is still doing excellent. 10/02/2020 upon evaluation today patient's wound actually appears to be showing signs of good improvement in regard to the heel. I am very pleased in this regard. With that being said I do believe that the area which is somewhat been stable and dry is starting to lift up and beginning to clear this away as well that is on the foot. Subsequently I am going to have to perform some debridement here and we did actually go ahead and allow this as a wound today as before it was just more deep tissue injury and eschar covering now I think it is a little bit more than that to be honest. 10/09/2020 upon evaluation today patient appears to be doing decently well in regard to her wounds. I am actually very pleased with where things stand today. There does not appear to be any signs of active infection which is great news. No fevers, chills, nausea, vomiting, or diarrhea. She is going require some sharp debridement today. 10/16/2020 upon evaluation today patient appears to be doing decently well in regard to her heel as well as the foot. Both are showing signs of good improvement which is great news. In general and extremely pleased with where things stand at this point. She is tolerating the [facility] well although this is getting so small in the heel I think collagen may be a better option here based on what I am seeing. With regard to the foot the Iodoflex/Iodosorb is still probably the best method here. 10/26/2020 upon evaluation today patient actually appears to be doing well in some respects today. She has been tolerating the dressing changes without complication and this is great news. The Hydrofera Blue is done well for the heel this actually appears to be healed today. With that being said in regard to the foot I think we are ready to switch to [facility] based on what I see at this point. 10/30/2020 upon evaluation today patient appears  to be doing well with regard to his wound. He has been tolerating the dressing changes without complication. Fortunately there does not appear to be any signs of active infection systemically at this time which is great news and overall very pleased with where things stand today. I do think that she is making progress although it somewhat slow still where showing signs of improvement little by little here. 11/06/2020 upon evaluation today patient appears to be doing decently well in regard to her wound. We will start and see better overall appearance of the base of  the wound which is great news she still continues to have some abnormal fluorescence signals with a MolecuLight DX that will be detailed below. 11/20/2020 upon evaluation today patient's wound actually showing signs of improvement. There is good to be some need for sharp debridement today and that was discussed with the patient. I am going to go ahead and clean this up but I do believe the [facility] is doing a great job. 11/27/2020; the patient had eschar over the original heel wound which I removed with a #3 curette there was nothing open here. The area is on the lateral left foot foot. Objective Constitutional Sitting or standing Blood Pressure is within target range for patient.. Pulse regular and within target range for patient.Marland Kitchen Respirations regular, non- labored and within target range.. Temperature is normal and within the target range for the patient.Marland Kitchen appears in no distress. Vitals Time Taken: 1:40 PM, Height: 58 in, Weight: 137 lbs, BMI: 28.6, Temperature: 98 F, Pulse: 96 bpm, Respiratory Rate: 16 breaths/min, Blood Pressure: 138/73 mmHg. General Notes: Wound exam; lateral left foot. 3 mm of circumferential undermining. I used a #3 curette to remove the overhanging skin. The granulation tissue actually looks fairly healthy. Hemostasis with a pressure dressing The area on the left heel under the eschar I removed today is  also closed. Integumentary (Hair, Skin) Wound #3 status is Open. Original cause of wound was Pressure Injury. The date acquired was: 10/02/2020. The wound has been in treatment 8 weeks. The wound is located on the Left Foot. The wound measures 0.4cm length x 0.5cm width x 0.3cm depth; 0.157cm^2 area and 0.047cm^3 volume. There is Fat Layer (Subcutaneous Tissue) exposed. There is no tunneling noted, however, there is undermining starting at 12:00 and ending at 12:00 with a maximum distance of 0.3cm. There is a medium amount of serosanguineous drainage noted. The wound margin is thickened. There is large (67-100%) red, pink granulation within the wound bed. There is a small (1-33%) amount of necrotic tissue within the wound bed including Adherent Slough. Assessment Active Problems [name], [name] ([id number]) ICD-10 Pressure ulcer of other site, stage 3 Essential (primary) hypertension Vascular dementia without behavioral disturbance Atherosclerotic heart disease of native coronary artery without angina pectoris Procedures Wound #3 Pre-procedure diagnosis of Wound #3 is a Pressure Ulcer located on the Left Foot . There was a Excisional Skin/Subcutaneous Tissue Debridement with a total area of 0.3 sq cm performed by Ricard Dillon, MD. With the following instrument(s): Curette to remove Viable and Non-Viable tissue/material. Material removed includes Callus and Subcutaneous Tissue and after achieving pain control using Lidocaine. A time out was conducted at 15:59, prior to the start of the procedure. A Minimum amount of bleeding was controlled with Pressure. The procedure was tolerated well. Post Debridement Measurements: 0.5cm length x 0.5cm width x 0.4cm depth; 0.079cm^3 volume. Post debridement Stage noted as Category/Stage III. Character of Wound/Ulcer Post Debridement is improved. Post procedure Diagnosis Wound #3: Same as Pre-Procedure Pre-procedure diagnosis of Wound #3 is a  Pressure Ulcer located on the Left Foot . There was a Three Layer Compression Therapy Procedure by Donnamarie Poag, RN. Post procedure Diagnosis Wound #3: Same as Pre-Procedure 1. No evidence of infection. 2. I change the primary dressing to endoform to see if we can stimulate some granulation 3. Single-layer Oasis through [employer]. Plan Follow-up Appointments: Return Appointment in 1 week. Nurse Visit as needed Bathing/ Shower/ Hygiene: May shower with wound dressing protected with water repellent cover or cast protector. No  tub bath. Edema Control - Lymphedema / Segmental Compressive Device / Other: Patient to wear own compression stockings. Remove compression stockings every night before going to bed and put on every morning when getting up. - right leg Elevate legs to the level of the heart and pump ankles as often as possible Elevate leg(s) parallel to the floor when sitting. DO YOUR BEST to sleep in the bed at night. DO NOT sleep in your recliner. Long hours of sitting in a recliner leads to swelling of the legs and/or potential wounds on your backside. Off-Loading: Other: - Offloading boots while in bed; Try pillow under sheet to keep it in place. WOUND #3: - Foot Wound Laterality: Left Cleanser: Soap and Water 1 x Per Week/30 Days Discharge Instructions: Gently cleanse wound with antibacterial soap, rinse and pat dry prior to dressing wounds Primary Dressing: Endoform 2x2 (in/in) 1 x Per Week/30 Days Discharge Instructions: Apply Endoform as directed moisten with hydroge Secondary Dressing: ABD Pad 5x9 (in/in) 1 x Per Week/30 Days Discharge Instructions: Cover with ABD pad Compression Wrap: Profore Lite LF 3 Multilayer Compression Bandaging System 1 x Per Week/30 Days Discharge Instructions: Apply 3 multi-layer wrap as prescribed. Electronic Signature(s) Signed: 11/27/2020 3:54:48 PM By: Linton Ham MD Entered By: Linton Ham on 11/27/2020 14:14:10 [name], [name]  ([id number]) -------------------------------------------------------------------------------- SuperBill Details Patient Name: [name], [name] Date of Service: 11/27/2020 Medical Record Number: [id number] Patient Account Number: [id number] Date of Birth/Sex: 08/15/1926 (87 y.o. F) Treating RN: Donnamarie Poag Primary Care Provider: [name] Other Clinician: Referring Provider: [name] Treating Provider/Extender: Tito Dine in Treatment: 22 Diagnosis Coding ICD-10 Codes Code Description [phone] Pressure ulcer of other site, stage 3 I10 Essential (primary) hypertension F01.50 Vascular dementia without behavioral disturbance I25.10 Atherosclerotic heart disease of native coronary artery without angina pectoris Facility Procedures CPT4 Code: [id number] Description: [id number] - DEB SUBQ TISSUE 20 SQ CM/< Modifier: Quantity: 1 CPT4 Code: Description: ICD-10 Diagnosis Description L89.893 Pressure ulcer of other site, stage 3 Modifier: Quantity: Physician Procedures CPT4 Code: [id number] Description: 11042 - WC PHYS SUBQ TISS 20 SQ CM Modifier: Quantity: 1 CPT4 Code: Description: ICD-10 Diagnosis Description L89.893 Pressure ulcer of other site, stage 3 Modifier: Quantity: Electronic Signature(s) Signed: 11/27/2020 3:54:48 PM By: Linton Ham MD Entered By: Linton Ham on 11/27/2020 14:14:27

## 2020-11-27 NOTE — Progress Notes (Signed)
Dona, [name] ([id number]) Visit Report for 11/27/2020 Arrival Information Details Patient Name: [name] [name], [name] Date of Service: 11/27/2020 1:45 PM Medical Record Number: [id number] Patient Account Number: [id number] Date of Birth/Sex: 1926/09/28 (87 y.o. F) Treating RN: [name] [name] Primary Care [name] [name]: [name] [name] Other Clinician: Referring [name] [name]: [name] [name] Treating [name] [name]/Extender: [name] [name] in Treatment: 22 Visit Information History Since Last Visit Added or deleted any medications: No Patient Arrived: Wheel Chair Had a fall or experienced change in No Arrival Time: 13:38 activities of daily living that may affect Accompanied By: daughter risk of falls: Transfer Assistance: EasyP[employer] Patient Lift Hospitalized since last visit: No Patient Identification Verified: Yes Has Dressing in Place as Prescribed: Yes Secondary Verification Process Completed: Yes Has Compression in Place as Prescribed: Yes Patient Requires Transmission-Based No Pain Present Now: No Precautions: Patient Has Alerts: Yes Patient Alerts: NOT diabetic ABI R1.18 L1.25 06/27/20 Electronic Signature(s) Signed: 11/27/2020 2:36:26 PM By: [name] [name] Entered ByDonnamarie [name] on 11/27/2020 13:39:09 [name] [name], [name] ([id number]) -------------------------------------------------------------------------------- Clinic Level of Care Assessment Details Patient Name: [name], [name] Date of Service: 11/27/2020 1:45 PM Medical Record Number: [id number] Patient Account Number: [id number] Date of Birth/Sex: September 04, 1926 (87 y.o. F) Treating RN: [name] [name] Primary Care [name] [name]: [name] [name] Other Clinician: Referring [name] [name]: [name] [name] Treating Drucilla Cumber/Extender: [name] [name] in Treatment: 22 Clinic Level of Care Assessment Items TOOL 1 Quantity Score '[]'$  - Use when EandM and Procedure is performed on INITIAL visit 0 ASSESSMENTS - Nursing Assessment /  Reassessment '[]'$  - General Physical Exam (combine w/ comprehensive assessment (listed just below) when performed on new 0 pt. evals) '[]'$  - 0 Comprehensive Assessment (HX, ROS, Risk Assessments, Wounds Hx, etc.) ASSESSMENTS - Wound and Skin Assessment / Reassessment '[]'$  - Dermatologic / Skin Assessment (not related to wound area) 0 ASSESSMENTS - Ostomy and/or Continence Assessment and Care '[]'$  - Incontinence Assessment and Management 0 '[]'$  - 0 Ostomy Care Assessment and Management (repouching, etc.) PROCESS - Coordination of Care '[]'$  - Simple Patient / Family Education for ongoing care 0 '[]'$  - 0 Complex (extensive) Patient / Family Education for ongoing care '[]'$  - 0 Staff obtains Programmer, systems, Records, Test Results / Process Orders '[]'$  - 0 Staff telephones HHA, Nursing Homes / Clarify orders / etc '[]'$  - 0 Routine Transfer to another Facility (non-emergent condition) '[]'$  - 0 Routine Hospital Admission (non-emergent condition) '[]'$  - 0 New Admissions / Biomedical engineer / Ordering NPWT, Apligraf, etc. '[]'$  - 0 Emergency Hospital Admission (emergent condition) PROCESS - Special Needs '[]'$  - Pediatric / Minor Patient Management 0 '[]'$  - 0 Isolation Patient Management '[]'$  - 0 Hearing / Language / Visual special needs '[]'$  - 0 Assessment of Community assistance (transportation, D/C planning, etc.) '[]'$  - 0 Additional assistance / Altered mentation '[]'$  - 0 Support Surface(s) Assessment (bed, cushion, seat, etc.) INTERVENTIONS - Miscellaneous '[]'$  - External ear exam 0 '[]'$  - 0 Patient Transfer (multiple staff / Civil Service fast streamer / Similar devices) '[]'$  - 0 Simple Staple / Suture removal (25 or less) '[]'$  - 0 Complex Staple / Suture removal (26 or more) '[]'$  - 0 Hypo/Hyperglycemic Management (do not check if billed separately) '[]'$  - 0 Ankle / Brachial Index (ABI) - do not check if billed separately Has the patient been seen at the hospital within the last three years: Yes Total Score: 0 Level Of Care:  ____ Weyer, Teneil L. ([id number]) Electronic Signature(s) Signed: 11/27/2020 2:36:26 PM By: [name] [name] Entered By: [name] [name] on 11/27/2020 14:18:38 [name] [name],  [name] ([id number]) -------------------------------------------------------------------------------- Compression Therapy Details Patient Name: [name], [name] Date of Service: 11/27/2020 1:45 PM Medical Record Number: [id number] Patient Account Number: [id number] Date of Birth/Sex: 1926/05/16 (87 y.o. F) Treating RN: [name] [name] Primary Care [name] [name]: [name] [name] Other Clinician: Referring [name] [name]: [name] [name] Treating [name] [name]/Extender: [name] [name] in Treatment: 22 Compression Therapy Performed for Wound Assessment: Wound #3 Left Foot Performed By: [name] Argyle, RN Compression Type: Three Layer Post Procedure Diagnosis Same as Pre-procedure Electronic Signature(s) Signed: 11/27/2020 2:36:26 PM By: [name] [name] Entered By: [name] [name] on 11/27/2020 13:59:55 [name] [name], [name] ([id number]) -------------------------------------------------------------------------------- Encounter Discharge Information Details Patient Name: [name], [name] Date of Service: 11/27/2020 1:45 PM Medical Record Number: [id number] Patient Account Number: [id number] Date of Birth/Sex: September 12, 1926 (87 y.o. F) Treating RN: [name] [name] Primary Care [name] [name]: [name] [name] Other Clinician: Referring [name] [name]: [name] [name] Treating [name] [name]/Extender: [name] [name] in Treatment: 22 Encounter Discharge Information Items Post Procedure Vitals Discharge Condition: Stable Temperature (F): 98 Ambulatory Status: Wheelchair Pulse (bpm): 96 Discharge Destination: Home Respiratory Rate (breaths/min): 16 Transportation: Private Auto Blood Pressure (mmHg): 138/73 Accompanied By: daughter Schedule Follow-up Appointment: Yes Clinical Summary of Care: Electronic Signature(s) Signed: 11/27/2020 2:36:26 PM By:  [name] [name] Entered ByDonnamarie [name] on 11/27/2020 14:20:13 [name] [name], [name] ([id number]) -------------------------------------------------------------------------------- Lower Extremity Assessment Details Patient Name: Pickering, Ayriana L. Date of Service: 11/27/2020 1:45 PM Medical Record Number: [id number] Patient Account Number: [id number] Date of Birth/Sex: 1927-02-14 (87 y.o. F) Treating RN: [name] [name] Primary Care Dalbert Stillings: [name] [name] Other Clinician: Referring Tyneka Scafidi: [name] [name] Treating Arlon Bleier/Extender: [name] [name] in Treatment: 22 Edema Assessment Assessed: [Left: Yes] [Right: No] [Left: Edema] [Right: :] Calf Left: Right: Point of Measurement: 32 cm From Medial Instep 32 cm Ankle Left: Right: Point of Measurement: 12 cm From Medial Instep 22 cm Knee To Floor Left: Right: From Medial Instep 40 cm Vascular Assessment Pulses: Dorsalis Pedis Palpable: [Left:Yes] Electronic Signature(s) Signed: 11/27/2020 2:36:26 PM By: [name] [name] Entered ByDonnamarie [name] on 11/27/2020 13:48:45 [name] [name], [name] ([id number]) -------------------------------------------------------------------------------- Multi Wound Chart Details Patient Name: [name] [name], [name] Date of Service: 11/27/2020 1:45 PM Medical Record Number: [id number] Patient Account Number: [id number] Date of Birth/Sex: 19-Jul-1926 (87 y.o. F) Treating RN: [name] [name] Primary Care Taheera Thomann: [name] [name] Other Clinician: Referring Johnita Palleschi: [name] [name] Treating Orry Sigl/Extender: [name] [name] in Treatment: 22 Vital Signs Height(in): 58 Pulse(bpm): 96 Weight(lbs): 137 Blood Pressure(mmHg): 138/73 Body Mass Index(BMI): 29 Temperature(F): 98 Respiratory Rate(breaths/min): 16 Photos: [N/A:N/A] Wound Location: Left Foot N/A N/A Wounding Event: Pressure Injury N/A N/A Primary Etiology: Pressure Ulcer N/A N/A Comorbid History: Cataracts, Anemia, Coronary Artery N/A N/A Disease,  Hypertension, Myocardial Infarction, End Stage Renal Disease, Gout, Osteoarthritis, Dementia Date Acquired: 10/02/2020 N/A N/A Weeks of Treatment: 8 N/A N/A Wound Status: Open N/A N/A Measurements L x W x D (cm) 0.4x0.5x0.3 N/A N/A Area (cm) : 0.157 N/A N/A Volume (cm) : 0.047 N/A N/A % Reduction in Area: 50.00% N/A N/A % Reduction in Volume: -51.60% N/A N/A Starting Position 1 (o'clock): 12 Ending Position 1 (o'clock): 12 Maximum Distance 1 (cm): 0.3 Undermining: Yes N/A N/A Classification: Category/Stage III N/A N/A Exudate Amount: Medium N/A N/A Exudate Type: Serosanguineous N/A N/A Exudate Color: red, brown N/A N/A Wound Margin: Thickened N/A N/A Granulation Amount: Large (67-100%) N/A N/A Granulation Quality: Red, Pink N/A N/A Necrotic Amount: Small (1-33%) N/A N/A Exposed Structures: Fat Layer (Subcutaneous Tissue): N/A N/A Yes Fascia: No Tendon: No Muscle: No Joint: No Bone:  No Debridement: Debridement - Excisional N/A N/A Pre-procedure Verification/Time 15:59 N/A N/A Out Taken: Pain Control: Lidocaine N/A N/A Tissue Debrided: Callus, Subcutaneous N/A N/A Level: Skin/Subcutaneous Tissue N/A N/A Debridement Area (sq cm): 0.3 N/A N/A Instrument: Curette N/A N/A Labarbera, Zetha L. ([id number]) Bleeding: Minimum N/A N/A Hemostasis Achieved: Pressure N/A N/A Debridement Treatment Procedure was tolerated well N/A N/A Response: Post Debridement 0.5x0.5x0.4 N/A N/A Measurements L x W x D (cm) Post Debridement Volume: 0.079 N/A N/A (cm) Post Debridement Stage: Category/Stage III N/A N/A Procedures Performed: Compression Therapy N/A N/A Debridement Treatment Notes Electronic Signature(s) Signed: 11/27/2020 3:54:48 PM By: Linton Ham MD Entered By: Linton Ham on 11/27/2020 14:10:03 [name] [name], [name] [name] ([id number]) -------------------------------------------------------------------------------- [facility] Details Patient Name: [name] [name],  [name] Date of Service: 11/27/2020 1:45 PM Medical Record Number: [id number] Patient Account Number: [id number] Date of Birth/Sex: 1926-09-06 (87 y.o. F) Treating RN: [name] [name] Primary Care Riyanshi Wahab: [name] [name] Other Clinician: Referring Najib Colmenares: [name] [name] Treating Carlee Vonderhaar/Extender: [name] [name] in Treatment: 22 Active Inactive Electronic Signature(s) Signed: 11/27/2020 2:36:26 PM By: [name] [name] Entered By: [name] [name] on 11/27/2020 13:59:02 [name] [name], [name] ([id number]) -------------------------------------------------------------------------------- Pain Assessment Details Patient Name: Vetsch, Tennille L. Date of Service: 11/27/2020 1:45 PM Medical Record Number: [id number] Patient Account Number: [id number] Date of Birth/Sex: 1926-10-27 (87 y.o. F) Treating RN: [name] [name] Primary Care Richardson Dubree: [name] [name] Other Clinician: Referring Trenton Verne: [name] [name] Treating Aden Sek/Extender: [name] [name] in Treatment: 22 Active Problems Location of Pain Severity and Description of Pain Patient Has Paino No Site Locations Rate the pain. Current Pain Level: 0 Pain Management and Medication Current Pain Management: Electronic Signature(s) Signed: 11/27/2020 2:36:26 PM By: [name] [name] Entered By: [name] [name] on 11/27/2020 13:45:38 [name] [name], [name] ([id number]) -------------------------------------------------------------------------------- Patient/Caregiver Education Details Patient Name: Behan, Allanna L. Date of Service: 11/27/2020 1:45 PM Medical Record Number: [id number] Patient Account Number: [id number] Date of Birth/Gender: 1926/05/20 (87 y.o. F) Treating RN: [name] [name] Primary Care Physician: [name] [name] Other Clinician: Referring Physician: Lelon [name] Treating Physician/Extender: [name] [name] in Treatment: 22 Education Assessment Education Provided To: Patient and Caregiver Education Topics Provided Wound  Debridement: Wound/Skin Impairment: Electronic Signature(s) Signed: 11/27/2020 2:36:26 PM By: [name] [name] Entered ByDonnamarie [name] on 11/27/2020 14:19:23 [name] [name], [name] ([id number]) -------------------------------------------------------------------------------- Wound Assessment Details Patient Name: [name] [name], Sybrina L. Date of Service: 11/27/2020 1:45 PM Medical Record Number: [id number] Patient Account Number: [id number] Date of Birth/Sex: 13-Apr-1926 (87 y.o. F) Treating RN: [name] [name] Primary Care Deigo Alonso: [name] [name] Other Clinician: Referring Markeita Alicia: [name] [name] Treating Ivet Guerrieri/Extender: [name] [name] in Treatment: 22 Wound Status Wound Number: 3 Primary Pressure Ulcer Etiology: Wound Location: Left Foot Wound Open Wounding Event: Pressure Injury Status: Date Acquired: 10/02/2020 Comorbid Cataracts, Anemia, Coronary Artery Disease, Weeks Of Treatment: 8 History: Hypertension, Myocardial Infarction, End Stage Renal Clustered Wound: No Disease, Gout, Osteoarthritis, Dementia Photos Wound Measurements Length: (cm) 0.4 % Red Width: (cm) 0.5 % Red Depth: (cm) 0.3 [place] Area: (cm) 0.157 Unde Volume: (cm) 0.047 St End Max uction in Area: 50% uction in Volume: -51.6% ling: No rmining: Yes arting Position (o'clock): 12 ing Position (o'clock): 12 imum Distance: (cm) 0.3 Wound Description Classification: Category/Stage III Foul Wound Margin: Thickened Slou Exudate Amount: Medium Exudate Type: Serosanguineous Exudate Color: red, brown Odor After Cleansing: No gh/Fibrino Yes Wound Bed Granulation Amount: Large (67-100%) Exposed Structure Granulation Quality: Red, Pink Fascia Exposed: No Necrotic Amount: Small (1-33%) Fat Layer (Subcutaneous Tissue) Exposed: Yes Necrotic Quality: Adherent Slough Tendon Exposed:  No Muscle Exposed: No Joint Exposed: No Bone Exposed: No Treatment Notes Wound #3 (Foot) Wound Laterality: Left Cleanser Soap  and Water Guiffre, Betsie L. ([id number]) Discharge Instruction: Gently cleanse wound with antibacterial soap, rinse and pat dry prior to dressing wounds Peri-Wound Care Topical Primary Dressing Endoform 2x2 (in/in) Discharge Instruction: Apply Endoform as directed moisten with hydroge Secondary Dressing ABD Pad 5x9 (in/in) Discharge Instruction: Cover with ABD pad Secured With Compression Wrap Profore Lite LF 3 Multilayer Compression Bandaging System Discharge Instruction: Apply 3 multi-layer wrap as prescribed. Compression Stockings Add-Ons Electronic Signature(s) Signed: 11/27/2020 2:36:26 PM By: [name] [name] Entered By: [name] [name] on 11/27/2020 13:47:35 Resurreccion, Keeanna L. ([id number]) -------------------------------------------------------------------------------- Vitals Details Patient Name: Maglione, Millette L. Date of Service: 11/27/2020 1:45 PM Medical Record Number: [id number] Patient Account Number: [id number] Date of Birth/Sex: 07-16-1926 (87 y.o. F) Treating RN: [name] [name] Primary Care Gabreal Worton: [name] [name] Other Clinician: Referring Shrinika Blatz: [name] [name] Treating Kethan Papadopoulos/Extender: [name] [name] in Treatment: 22 Vital Signs Time Taken: 13:40 Temperature (F): 98 Height (in): 58 Pulse (bpm): 96 Weight (lbs): 137 Respiratory Rate (breaths/min): 16 Body Mass Index (BMI): 28.6 Blood Pressure (mmHg): 138/73 Reference Range: 80 - 120 mg / dl Electronic Signature(s) Signed: 11/27/2020 2:36:26 PM By: [name] [name] Entered ByDonnamarie [name] on 11/27/2020 13:40:54

## 2020-11-29 ENCOUNTER — Telehealth (HOSPITAL_COMMUNITY): Payer: Self-pay | Admitting: Physician Assistant

## 2020-11-29 ENCOUNTER — Ambulatory Visit (HOSPITAL_BASED_OUTPATIENT_CLINIC_OR_DEPARTMENT_OTHER)
Admission: RE | Admit: 2020-11-29 | Discharge: 2020-11-29 | Disposition: A | Discharge status: Home / Self Care | Payer: PPO | Source: Ambulatory Visit | Attending: Physician Assistant | Admitting: Physician Assistant

## 2020-11-29 ENCOUNTER — Ambulatory Visit: Payer: Self-pay

## 2020-11-29 ENCOUNTER — Encounter (HOSPITAL_COMMUNITY): Payer: Self-pay | Admitting: Emergency Medicine

## 2020-11-29 ENCOUNTER — Other Ambulatory Visit: Payer: Self-pay

## 2020-11-29 ENCOUNTER — Ambulatory Visit (HOSPITAL_COMMUNITY)
Admission: EM | Admit: 2020-11-29 | Discharge: 2020-11-29 | Disposition: A | Discharge status: Home / Self Care | Payer: PPO | Attending: Physician Assistant | Admitting: Physician Assistant

## 2020-11-29 DIAGNOSIS — L03115 Cellulitis of right lower limb: Secondary | ICD-10-CM

## 2020-11-29 DIAGNOSIS — M7989 Other specified soft tissue disorders: Secondary | ICD-10-CM | POA: Insufficient documentation

## 2020-11-29 DIAGNOSIS — M79661 Pain in right lower leg: Secondary | ICD-10-CM | POA: Insufficient documentation

## 2020-11-29 DIAGNOSIS — M79604 Pain in right leg: Secondary | ICD-10-CM

## 2020-11-29 LAB — COMPREHENSIVE METABOLIC PANEL
ALT: 12 U/L (ref 0–44)
AST: 15 U/L (ref 15–41)
Albumin: 3.4 g/dL — ABNORMAL LOW (ref 3.5–5.0)
Alkaline Phosphatase: 86 U/L (ref 38–126)
Anion gap: 10 (ref 5–15)
BUN: 30 mg/dL — ABNORMAL HIGH (ref 8–23)
CO2: 25 mmol/L (ref 22–32)
Calcium: 10.1 mg/dL (ref 8.9–10.3)
Chloride: 103 mmol/L (ref 98–111)
Creatinine, Ser: 1.25 mg/dL — ABNORMAL HIGH (ref 0.44–1.00)
GFR, Estimated: 40 mL/min — ABNORMAL LOW (ref >60)
Glucose, Bld: 101 mg/dL — ABNORMAL HIGH (ref 70–99)
Potassium: 4 mmol/L (ref 3.5–5.1)
Sodium: 138 mmol/L (ref 135–145)
Total Bilirubin: 0.5 mg/dL (ref 0.3–1.2)
Total Protein: 6.6 g/dL (ref 6.5–8.1)

## 2020-11-29 LAB — CBC WITH DIFFERENTIAL/PLATELET
Abs Immature Granulocytes: 0.05 10*3/uL (ref 0.00–0.07)
Basophils Absolute: 0.1 10*3/uL (ref 0.0–0.1)
Basophils Relative: 1 %
Eosinophils Absolute: 0.2 10*3/uL (ref 0.0–0.5)
Eosinophils Relative: 2 %
HCT: 34.9 % — ABNORMAL LOW (ref 36.0–46.0)
Hemoglobin: 11.2 g/dL — ABNORMAL LOW (ref 12.0–15.0)
Immature Granulocytes: 1 %
Lymphocytes Relative: 21 %
Lymphs Abs: 1.7 10*3/uL (ref 0.7–4.0)
MCH: 29 pg (ref 26.0–34.0)
MCHC: 32.1 g/dL (ref 30.0–36.0)
MCV: 90.4 fL (ref 80.0–100.0)
Monocytes Absolute: 0.6 10*3/uL (ref 0.1–1.0)
Monocytes Relative: 8 %
Neutro Abs: 5.8 10*3/uL (ref 1.7–7.7)
Neutrophils Relative %: 67 %
Platelets: 157 10*3/uL (ref 150–400)
RBC: 3.86 MIL/uL — ABNORMAL LOW (ref 3.87–5.11)
RDW: 14.9 % (ref 11.5–15.5)
WBC: 8.4 10*3/uL (ref 4.0–10.5)
nRBC: 0 % (ref 0.0–0.2)

## 2020-11-29 MED ORDER — ELIQUIS DVT/PE STARTER PACK 5 MG PO TBPK
ORAL_TABLET | ORAL | 0 refills | Status: DC
Start: 2020-11-29 — End: 2020-12-25

## 2020-11-29 MED ORDER — DOXYCYCLINE HYCLATE 100 MG PO CAPS: 100.0000 mg | ORAL_CAPSULE | Freq: Two times a day (BID) | ORAL | 0 refills | Status: DC

## 2020-11-29 NOTE — ED Triage Notes (Signed)
Pt presents with right leg swelling and redness. States has hx of cellulitis.

## 2020-11-29 NOTE — Telephone Encounter (Signed)
Patient was sent for DVT ultrasound given unilateral leg swelling and redness.  Ultrasound did show DVT in gastrocnemius.  Contacted patient's daughter and primary caregiver and discussed results.  We will start anticoagulation with Eliquis 10 mg twice daily x7 days then switch to maintenance dose of 5 mg twice daily.  CMP was obtained today with creatinine of 1.25 and calculated creatinine clearance of 28.17 mL/min.  Discussed the importance of following with primary care as soon as possible.  She will need repeat ultrasound in a few weeks.  Discussed that if she develops any worsening pain, shortness of breath, heart racing, chest pain she must go to the emergency room.  All questions were answered to satisfaction of daughter.

## 2020-11-29 NOTE — ED Notes (Signed)
Appt made for 9am tomorrow for DVT US at [facility]. Daughter advised she can call back and change appt if needed. Provider okay with scheduling tomorrow with no availability today.

## 2020-11-29 NOTE — Telephone Encounter (Signed)
Patient's daughter Noreene Larsson called and says the patient has worsening swelling and redness to her right lower leg. She says the leg has chronic swelling. She says the leg is warm to the touch, patient reports no pain. Denies fever, denies any other symptoms. Advised no availability with PCP or any provider in the office for the next week, called office and spoke to [place], [facility] who says no appointments and to advise Telehealth visit or UC. Patient's daughter advised of the above, she verbalized understanding and says she will do the Telehealth visit, care advice given, she verbalized understanding.   Reason for Disposition  [1] MODERATE leg swelling (e.g., swelling extends up to knees) AND [2] new-onset or worsening  Answer Assessment - Initial Assessment Questions 1. ONSET: "When did the swelling start?" (e.g., minutes, hours, days)     The last several weeks 2. LOCATION: "What part of the leg is swollen?"  "Are both legs swollen or just one leg?"     Right leg, knee down, redness 3. SEVERITY: "How bad is the swelling?" (e.g., localized; mild, moderate, severe)  - Localized - small area of swelling localized to one leg  - MILD pedal edema - swelling limited to foot and ankle, pitting edema < 1/4 inch (6 mm) deep, rest and elevation eliminate most or all swelling  - MODERATE edema - swelling of lower leg to knee, pitting edema > 1/4 inch (6 mm) deep, rest and elevation only partially reduce swelling  - SEVERE edema - swelling extends above knee, facial or hand swelling present      Moderate 4. REDNESS: "Does the swelling look red or infected?"     Yes 5. PAIN: "Is the swelling painful to touch?" If Yes, ask: "How painful is it?"   (Scale 1-10; mild, moderate or severe)     No 6. FEVER: "Do you have a fever?" If Yes, ask: "What is it, how was it measured, and when did it start?"      No 7. CAUSE: "What do you think is causing the leg swelling?"     Chronic swelling, possible cellulitis 8.  MEDICAL HISTORY: "Do you have a history of heart failure, kidney disease, liver failure, or cancer?"     Yes 9. RECURRENT SYMPTOM: "Have you had leg swelling before?" If Yes, ask: "When was the last time?" "What happened that time?"     Chronic swelling; cellulitis in February, went to the hospital 10. OTHER SYMPTOMS: "Do you have any other symptoms?" (e.g., chest pain, difficulty breathing)      No 11. PREGNANCY: "Is there any chance you are pregnant?" "When was your last menstrual period?"       No  Protocols used: Leg Swelling and Edema-A-AH

## 2020-11-29 NOTE — ED Provider Notes (Signed)
[facility]    CSN: [id number] Arrival date & time: 11/29/20  1101      History   Chief Complaint Chief Complaint  Patient presents with   Leg Swelling    right    HPI [name] [name] is a 87 y.o. female.   Patient presents today companied by her daughter help provide majority of history.  Reports a several day history of worsening right leg swelling and redness.  She reports associated pain which is rated 4 on a 0-10 pain scale, generalized throughout lower leg, described as aching, no aggravating leaving factors identified.  She does have a history of chronic wound on her left flank that has resulted in cellulitis which required hospitalization in February.  Reports current symptoms are similar to previous episodes of this condition.  She has not tried any over-the-counter medications for symptom management.  Denies any recent antibiotic use.  She is followed by wound care but denies having any wounds on her right leg.  Her daughter reports that she did have 1 small scabbed area on her anterior leg but this has since healed.  She denies any fever, nausea, vomiting, body aches.  Denies any chest pain, shortness of breath, palpitations.  She denies history of VTE event, recent hospitalization, recent surgery, exogenous hormone use, recent hospitalization.   Past Medical History:  Diagnosis Date   Hyperlipidemia    Hypertension    Myocardial infarction [facility])     Patient Active Problem List   Diagnosis Date Noted   Closed displaced fracture of right femoral neck ([place]) 03/27/2020   CKD (chronic kidney disease) stage 4, GFR 15-29 ml/min ([place]) 03/27/2020   Cellulitis 03/27/2020   Localized edema 01/10/2020   Peripheral vascular disease ([place]) 06/14/2019   Female bladder prolapse 05/13/2019   Constipation 08/11/2018   Right foot ulcer ([place]) 01/24/2015   Esophageal reflux 08/16/2014   Obesity 08/16/2014   Osteopenia 08/16/2014   Varicose veins of both legs with edema  08/16/2014   Prediabetes 10/02/2008   CAD in native artery 09/26/2008   Gout 09/26/2008   History of tobacco use 09/26/2008   Hypertension 09/26/2008   Hypothyroid 09/26/2008   Hypercholesterolemia without hypertriglyceridemia 09/26/2008    Past Surgical History:  Procedure Laterality Date   ABDOMINAL HYSTERECTOMY  1995   abdominal-uterine prolapse, 1 ovary removed, bladder tact   [place] Right 03/28/2020   Procedure: CANNULATED HIP PINNING;  Surgeon: Thornton Park, MD;  Location: [facility] ORS;  Service: Orthopedics;  Laterality: Right;   TONSILLECTOMY AND ADENOIDECTOMY      OB History   No obstetric history on file.      Home Medications    Prior to Admission medications   Medication Sig Start Date End Date Taking? Authorizing Provider  doxycycline (VIBRAMYCIN) 100 MG capsule Take 1 capsule (100 mg total) by mouth 2 (two) times daily. 11/29/20  Yes Malaina Mortellaro, Derry Skill, PA-C  allopurinol (ZYLOPRIM) 100 MG tablet Take 2 tablets (200 mg total) by mouth daily. 06/21/20   Birdie Sons, MD  alum & mag hydroxide-simeth (MAALOX/MYLANTA) 200-200-20 MG/5ML suspension Take 30 mLs by mouth every 4 (four) hours as needed for indigestion. Patient not taking: Reported on 05/28/2020 04/02/20   Nicole Kindred A, DO  amLODipine (NORVASC) 2.5 MG tablet TAKE ONE TABLET BY MOUTH EVERY EVENING 09/25/20   Birdie Sons, MD  bisacodyl (DULCOLAX) 10 MG suppository Place 1 suppository (10 mg total) rectally  daily as needed for moderate constipation. Patient not taking: No sig reported 04/02/20   Nicole Kindred A, DO  Calcium Carb-Cholecalciferol (CALCIUM 600 + D PO) Take 1 tablet by mouth daily.    [provider]  docusate sodium (COLACE) 100 MG capsule Take 1 capsule (100 mg total) by mouth 2 (two) times daily. Hold if having loose or frequent BM's. 04/02/20   Nicole Kindred A, DO  feeding supplement (ENSURE ENLIVE / ENSURE PLUS) LIQD  Take 237 mLs by mouth 2 (two) times daily between meals. Patient not taking: No sig reported 04/02/20   Nicole Kindred A, DO  ferrous sulfate 325 (65 FE) MG tablet Take 325 mg by mouth daily with breakfast.    [provider]  levothyroxine (SYNTHROID) 88 MCG tablet Take 1 tablet (88 mcg total) by mouth daily before breakfast. 09/24/20   Birdie Sons, MD  Multiple Vitamins-Minerals (PRESERVISION AREDS 2 PO) Take 1 tablet by mouth 2 (two) times daily.    [provider]  omeprazole (PRILOSEC) 20 MG capsule TAKE 1 CAPSULE BY MOUTH DAILY 09/24/20   Birdie Sons, MD  valsartan-hydrochlorothiazide (DIOVAN-HCT) 320-12.5 MG tablet Take 1 tablet by mouth daily. 05/28/20   Birdie Sons, MD    Family History Family History  Problem Relation Age of Onset   Ovarian cancer Mother    Alcohol abuse Sister    Alzheimer's disease Sister     Social History Social History   Tobacco Use   Smoking status: Former    Types: Cigarettes    Quit date: 02/18/2000    Years since quitting: 20.7   Smokeless tobacco: Never  Vaping Use   Vaping Use: Never used  Substance Use Topics   Alcohol use: No    Alcohol/week: 0.0 standard drinks   Drug use: No     Allergies   Atorvastatin, Lovastatin, and Simvastatin   Review of Systems Review of Systems  Constitutional:  Positive for activity change. Negative for appetite change, fatigue and fever.  Respiratory:  Negative for cough and shortness of breath.   Cardiovascular:  Positive for leg swelling. Negative for chest pain and palpitations.  Gastrointestinal:  Negative for abdominal pain, diarrhea, nausea and vomiting.  Musculoskeletal:  Negative for arthralgias and myalgias.  Skin:  Positive for color change. Negative for wound.  Neurological:  Negative for dizziness, light-headedness and headaches.    Physical Exam Triage Vital Signs ED Triage Vitals  Enc Vitals Group     BP 11/29/20 1232 140/82     Pulse Rate 11/29/20 1232  88     Resp 11/29/20 1232 17     Temp 11/29/20 1232 97.8 F (36.6 C)     Temp Source 11/29/20 1232 Oral     SpO2 11/29/20 1232 98 %     Weight --      Height --      Head Circumference --      Peak Flow --      Pain Score 11/29/20 1230 0     Pain Loc --      Pain Edu? --      Excl. in [place]? --    No data found.  Updated Vital Signs BP 140/82 (BP Location: Right Arm)   Pulse 88   Temp 97.8 F (36.6 C) (Oral)   Resp 17   SpO2 98%   Visual Acuity Right Eye Distance:   Left Eye Distance:   Bilateral Distance:    Right Eye Near:  Left Eye Near:    Bilateral Near:     Physical Exam Vitals reviewed.  Constitutional:      General: She is awake. She is not in acute distress.    Appearance: Normal appearance. She is well-developed. She is not ill-appearing.     Comments: Very pleasant female appears stated age in no acute distress sitting comfortably in her wheelchair  HENT:     Head: Normocephalic and atraumatic.  Cardiovascular:     Rate and Rhythm: Normal rate and regular rhythm.     Heart sounds: Normal heart sounds, S1 normal and S2 normal. No murmur heard.    Comments: 3+ pitting edema to mid anterior tibia right Pulmonary:     Effort: Pulmonary effort is normal.     Breath sounds: Normal breath sounds. No wheezing, rhonchi or rales.     Comments: Clear to auscultation bilaterally Abdominal:     Palpations: Abdomen is soft.     Tenderness: There is no abdominal tenderness.     Comments: Benign abdominal exam  Musculoskeletal:     Right lower leg: Tenderness present. No deformity or bony tenderness. 3+ Edema present.  Feet:     Right foot:     Skin integrity: Erythema, warmth and callus present. No skin breakdown.     Toenail Condition: Right toenails are abnormally thick.  Psychiatric:        Behavior: Behavior is cooperative.     UC Treatments / Results  Labs (all labs ordered are listed, but only abnormal results are displayed) Labs Reviewed  CBC WITH  DIFFERENTIAL/PLATELET  COMPREHENSIVE METABOLIC PANEL    EKG   Radiology No results found.  Procedures Procedures (including critical care time)  Medications Ordered in UC Medications - No data to display  Initial Impression / Assessment and Plan / UC Course  I have reviewed the triage vital signs and the nursing notes.  Pertinent labs & imaging results that were available during my care of the patient were reviewed by me and considered in my medical decision making (see chart for details).      Swelling suspect cellulitis given history of the same with similar presentation.  Patient was started on doxycycline 100 mg twice daily for 10 days.  Recommended she keep leg elevated and use compression for symptom relief.  She can use Tylenol for pain relief.  Given clinical presentation will obtain ultrasound to rule out DVT though low suspicion for this condition.  CBC and CMP were obtained today to monitor blood counts with cellulitis as well as kidney function should patient require additional medication she does have a history of CKD.  Recommended follow-up with wound specialist as previously recommended.  Discussed alarm symptoms that warrant emergent evaluation.  Strict return precautions given to which daughter expressed understanding.  Final Clinical Impressions(s) / UC Diagnoses   Final diagnoses:  Right leg swelling  Right leg pain  Cellulitis of leg, right     Discharge Instructions      We are treating you for cellulitis.  Please take doxycycline 100 mg twice daily for 10 days.  Keep your leg elevated and use compression for additional symptom relief.  Follow-up with wound clinic as scheduled.  We will contact you if your lab work is abnormal.  If you develop any fever, nausea, vomiting you need to be seen immediately.  We are going to get an ultrasound to rule out a blood clot though I think this is unlikely.  If you develop any  chest pain or shortness of breath or heart  racing you need to go to the emergency room as we discussed.     ED Prescriptions     Medication Sig Dispense Auth. Provider   doxycycline (VIBRAMYCIN) 100 MG capsule Take 1 capsule (100 mg total) by mouth 2 (two) times daily. 20 capsule Babbie Dondlinger, Derry Skill, PA-C      PDMP not reviewed this encounter.   Terrilee Croak, PA-C 11/29/20 1317

## 2020-11-29 NOTE — Progress Notes (Signed)
Lower extremity venous RT study completed.  Preliminary results relayed to [place], [place].  See CV Proc for preliminary results report.   Darlin Coco, RDMS, RVT

## 2020-11-29 NOTE — Discharge Instructions (Addendum)
We are treating you for cellulitis.  Please take doxycycline 100 mg twice daily for 10 days.  Keep your leg elevated and use compression for additional symptom relief.  Follow-up with wound clinic as scheduled.  We will contact you if your lab work is abnormal.  If you develop any fever, nausea, vomiting you need to be seen immediately.  We are going to get an ultrasound to rule out a blood clot though I think this is unlikely.  If you develop any chest pain or shortness of breath or heart racing you need to go to the emergency room as we discussed.

## 2020-11-30 ENCOUNTER — Telehealth: Payer: Self-pay

## 2020-11-30 NOTE — Telephone Encounter (Signed)
Copied from [place] [phone]. Topic: General - Other >> Nov 30, 2020  9:07 AM Leward Quan A wrote: Reason for CRM: Patient daughter Noreene Larsson called in to inform Dr Caryn Section that patient went to Urgent Care and was found to have a blood clot in her right leg and need to be seen in office for that. Patient was placed on antibiotics for Cellulitis will need to be seen ASAP for the clot please call Jeanie at Ph# [phone]

## 2020-11-30 NOTE — Telephone Encounter (Signed)
That's fine. Can have one of the acute visit slots. Thanks.

## 2020-11-30 NOTE — Telephone Encounter (Signed)
Please review.  Is it okay to take one of the same day appointments?  Thanks,   -Mickel Baas

## 2020-11-30 NOTE — Telephone Encounter (Signed)
Appointment scheduled for 12/03/2020 at 11:20am.

## 2020-12-03 ENCOUNTER — Ambulatory Visit (INDEPENDENT_AMBULATORY_CARE_PROVIDER_SITE_OTHER): Payer: PPO | Admitting: Family Medicine

## 2020-12-03 ENCOUNTER — Other Ambulatory Visit: Payer: Self-pay

## 2020-12-03 ENCOUNTER — Encounter: Payer: Self-pay | Admitting: Family Medicine

## 2020-12-03 VITALS — BP 132/81 | HR 84 | Temp 97.9°F | Wt 131.0 lb

## 2020-12-03 DIAGNOSIS — Z23 Encounter for immunization: Secondary | ICD-10-CM

## 2020-12-03 DIAGNOSIS — I82A11 Acute embolism and thrombosis of right axillary vein: Secondary | ICD-10-CM

## 2020-12-03 DIAGNOSIS — I82461 Acute embolism and thrombosis of right calf muscular vein: Secondary | ICD-10-CM

## 2020-12-03 DIAGNOSIS — L0390 Cellulitis, unspecified: Secondary | ICD-10-CM

## 2020-12-03 NOTE — Progress Notes (Addendum)
Established patient visit   Patient: [name] [name]   DOB: 26-Aug-1926   87 y.o. Female  MRN: [id number] Visit Date: 12/03/2020  Today's healthcare provider: [name], MD   Chief Complaint  Patient presents with   Follow-up    Subjective    HPI  Follow up from urgent care visit:  Patient was seen at and urgent care for right leg swelling on 11/29/2020. She was initially prescribed Doxycycline for treatment of cellulitis. Doppler ultrasound was ordered and showed DVT in gastrocnemius. Patient was started on anticoagulation with Eliquis 10 mg twice daily x7 days then switch to maintenance dose of 5 mg twice daily. She reports excellent compliance with treatment. She reports this condition is Improved.  -----------------------------------------------------------------------------------------     Medications: Outpatient Medications Prior to Visit  Medication Sig   allopurinol (ZYLOPRIM) 100 MG tablet Take 2 tablets (200 mg total) by mouth daily.   Apixaban Starter Pack, '10mg'$  and '5mg'$ , (ELIQUIS DVT/PE STARTER PACK) Take as directed on package: start with two-'5mg'$  tablets twice daily for 7 days. On day 8, switch to one-'5mg'$  tablet twice daily.   Calcium Carb-Cholecalciferol (CALCIUM 600 + D PO) Take 1 tablet by mouth daily.   doxycycline (VIBRAMYCIN) 100 MG capsule Take 1 capsule (100 mg total) by mouth 2 (two) times daily.   ferrous sulfate 325 (65 FE) MG tablet Take 325 mg by mouth daily with breakfast.   levothyroxine (SYNTHROID) 88 MCG tablet Take 1 tablet (88 mcg total) by mouth daily before breakfast.   Multiple Vitamins-Minerals (PRESERVISION AREDS 2 PO) Take 1 tablet by mouth 2 (two) times daily.   omeprazole (PRILOSEC) 20 MG capsule TAKE 1 CAPSULE BY MOUTH DAILY   valsartan-hydrochlorothiazide (DIOVAN-HCT) 320-12.5 MG tablet Take 1 tablet by mouth daily.   alum & mag hydroxide-simeth (MAALOX/MYLANTA) 200-200-20 MG/5ML suspension Take 30 mLs by mouth every 4 (four)  hours as needed for indigestion. (Patient not taking: Reported on 05/28/2020)   amLODipine (NORVASC) 2.5 MG tablet TAKE ONE TABLET BY MOUTH EVERY EVENING (Patient not taking: Reported on 12/03/2020)   bisacodyl (DULCOLAX) 10 MG suppository Place 1 suppository (10 mg total) rectally daily as needed for moderate constipation. (Patient not taking: No sig reported)   docusate sodium (COLACE) 100 MG capsule Take 1 capsule (100 mg total) by mouth 2 (two) times daily. Hold if having loose or frequent BM's. (Patient not taking: Reported on 12/03/2020)   feeding supplement (ENSURE ENLIVE / ENSURE PLUS) LIQD Take 237 mLs by mouth 2 (two) times daily between meals. (Patient not taking: Reported on 12/03/2020)   No facility-administered medications prior to visit.    Review of Systems  Constitutional: Negative.   Respiratory: Negative.    Cardiovascular:  Positive for leg swelling. Negative for chest pain and palpitations.  Gastrointestinal: Negative.   Neurological:  Negative for dizziness, light-headedness and headaches.      Objective    BP 132/81 (BP Location: Right Arm, Patient Position: Sitting, Cuff Size: Normal)   Pulse 84   Temp 97.9 F (36.6 C) (Oral)   Wt 131 lb (59.4 kg)   SpO2 98%   BMI 27.38 kg/m    Physical Exam  Moderate varicosities of left LE. Dull redness mid and distal calf. No calf tenderness.     Assessment & Plan     1. DVT of calf muscle vein, acute right (HCC) 3 days into NOAC therapy. Anticipated 3 months therapy. Is to follow up as previously scheduled early January  2.  Cellulitis, unspecified cellulitis site Doing well with doxycycline   Future Appointments  Date Time Provider [place]  12/04/2020 11:30 AM Jeri Cos [place] III, PA-C [facility] None  12/11/2020 11:30 AM Woodroe Chen III, PA-C [facility] None  12/18/2020 11:15 AM Edrick Kins, DPM [facility]  02/19/2021 11:00 AM Birdie Sons, MD [facility]        The entirety of  the information documented in the History of Present Illness, Review of Systems and Physical Exam were personally obtained by me. Portions of this information were initially documented by the CMA and reviewed by me for thoroughness and accuracy.     [name], MD  [facility] [phone] (phone) [phone] (fax)  [place]

## 2020-12-04 ENCOUNTER — Other Ambulatory Visit: Payer: Self-pay

## 2020-12-04 ENCOUNTER — Encounter: Payer: PPO | Admitting: Physician Assistant

## 2020-12-04 DIAGNOSIS — L89893 Pressure ulcer of other site, stage 3: Secondary | ICD-10-CM | POA: Diagnosis not present

## 2020-12-04 NOTE — Progress Notes (Addendum)
[name] [name], [name] ([id number]) Visit Report for 12/04/2020 Chief Complaint Document Details Patient Name: [name] [name], [name] Date of Service: 12/04/2020 11:30 AM Medical Record Number: [id number] Patient Account Number: [id number] Date of Birth/Sex: 04/02/1926 (87 y.o. F) Treating RN: Cornell Barman Primary Care Provider: [name] Other Clinician: Referring Provider: [name] Treating Provider/Extender: Skipper Cliche in Treatment: 23 Information Obtained from: Patient Chief Complaint Left Heel Pressure Ulcer Electronic Signature(s) Signed: 12/04/2020 11:53:48 AM By: Worthy Keeler PA-C Entered By: Worthy Keeler on 12/04/2020 11:53:48 [name] [name], [name] ([id number]) -------------------------------------------------------------------------------- Debridement Details Patient Name: [name] [name], [name] Date of Service: 12/04/2020 11:30 AM Medical Record Number: [id number] Patient Account Number: [id number] Date of Birth/Sex: Mar 10, 1926 (87 y.o. F) Treating RN: Cornell Barman Primary Care Provider: [name] Other Clinician: Referring Provider: [name] Treating Provider/Extender: Skipper Cliche in Treatment: 23 Debridement Performed for Wound #3 Left Foot Assessment: Performed By: Physician Tommie Sams., PA-C Debridement Type: Debridement Level of Consciousness (Pre- Awake and Alert procedure): Pre-procedure Verification/Time Out Yes - 12:38 Taken: Total Area Debrided (L x W): 0.5 (cm) x 0.7 (cm) = 0.35 (cm) Tissue and other material Viable, Non-Viable, Eschar, Subcutaneous debrided: Level: Skin/Subcutaneous Tissue Debridement Description: Excisional Instrument: Curette Bleeding: Minimum Hemostasis Achieved: Pressure Response to Treatment: Procedure was tolerated well Level of Consciousness (Post- Awake and Alert procedure): Post Debridement Measurements of Total Wound Length: (cm) 0.5 Stage: Category/Stage III Width: (cm) 0.7 Depth: (cm)  0.1 Volume: (cm) 0.027 Character of Wound/Ulcer Post Debridement: Stable Post Procedure Diagnosis Same as Pre-procedure Electronic Signature(s) Signed: 12/04/2020 4:43:23 PM By: Worthy Keeler PA-C Signed: 12/04/2020 5:32:40 PM By: Gretta Cool, BSN, RN, CWS, Kim RN, BSN Entered By: Gretta Cool, BSN, RN, CWS, Kim on 12/04/2020 12:29:18 [name] [name], [name] ([id number]) -------------------------------------------------------------------------------- HPI Details Patient Name: [name] [name], [name] Date of Service: 12/04/2020 11:30 AM Medical Record Number: [id number] Patient Account Number: [id number] Date of Birth/Sex: 1926/08/05 (87 y.o. F) Treating RN: Cornell Barman Primary Care Provider: [name] Other Clinician: Referring Provider: [name] Treating Provider/Extender: Skipper Cliche in Treatment: 23 History of Present Illness HPI Description: 87 year old patient was recently seen by the PCPs office for significant pain right great toe which has been going on since July. She was initially treated with Keflex which she did not complete and after the office visit this time she has been put on doxycycline. at the time of her visit she was found to have a ulcer on the plantar surface of the right great toe and also had a pyogenic granuloma over this area. X-ray of the right foot done 12/29/2014 -- IMPRESSION:Soft-tissue swelling and ulceration right great toe. No underlying bony lytic lesion identified. If osteomyelitis remains of clinical concern MRI can be obtained. Past medical history significant for anemia, chronic kidney disease stage III, obesity, varicose veins, coronary artery disease, gout, history of nicotine addiction given up smoking in 2002, hypertension, status post cardiac catheterization, status post abdominal hysterectomy, cholecystectomy and tonsillectomy. hemoglobin A1c done in August was 5.8 01/22/2015 -- at this stage the [employer] Walking boat was going to cost them significant  amount of money and they want to defer using that at the present time. 01/29/2015 -- she had a podiatry appointment and they have trimmed her toenails. She has not heard back from the vascular office regarding her venous duplex study and I have asked them to call personally so that they can get the appointment soon. 02/06/2015 -- they have made contact with the vascular office and from what I  understand that test has been done but the report is pending. 02/19/2014 -- the vascular test is scheduled for tomorrow Readmission: 06/26/2020 upon evaluation today patient appears for initial evaluation in her clinic that she has been here before in 2016 and has been quite sometime. She did have a fractured hip in February on the 23rd 2022. Subsequently this had to be pinned and she ended up with a pressure injury on her heel following when she was using her foot to help move her around in the bed. Subsequently this has led to the wound that she has been dealing with since that time. She lives at home with her daughter currently she does have dementia. 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 07/03/2020 upon evaluation today patient appears to be doing well with regard to her heel ulcer. She did have arterial studies they appear to be doing excellent it was premature normal across the board with TBI's in the 90s and ABIs well within normal range. Nonetheless there does not appear to be any signs of arterial insufficiency whatsoever. With that being said the patient does have also signs of improvement there is some necrotic tissue in the base of the wound number to try to clear some of that away today I do believe the Iodoflex/Iodosorb is doing well. 5/24; difficult punched out wound on the left medial heel. She has been using Iodoflex 07/17/2020 upon evaluation today patient  appears to be doing well at this point in regard to her wound. I do feel like this is a little bit deeper but again that is what is expected as we continue with the Iodoflex I think that this is just going to get deeper until we get to the base of the wound. With that being said I think we are getting much closer to the base of the wound where we can have healthier tissue that we get a be managing here which that will be awesome. In the meantime I am not surprised by what I am seeing and in fact the wound appears to be better as compared to previous findings. 07/24/2020 upon evaluation today patient appears to be doing well with regard to her wound. Overall I am extremely pleased with where things stand today. I do not see any signs of active infection which is great and overall I think that the patient is making good progress. There is good to be some need for sharp debridement today. 07/31/2020 upon evaluation today patient appears to be doing better in regard to her heel ulcer. She has been tolerating the dressing changes without complication. Fortunately there does not appear to be any signs of active infection which is great and overall very pleased in this regard. No fevers, chills, nausea, vomiting, or diarrhea. 08/14/2020 upon evaluation today patient appears to be doing better in regard to her heel ulcer. I am very pleased in that regard. Unfortunately she still has a slight deep tissue injury in regard to the medial portion of her foot over the same area. Unfortunately I think this is something that if were not careful it was can open up into the wound. That is my main concern here based on what I see. Fortunately there does not appear to be any evidence of active infection which is great news and overall very pleased with where things stand at this point. 08/21/2020 upon evaluation today patient  appears to be doing well with regard to her wound. She has been tolerating the dressing changes  without complication. Fortunately there does not appear to be any signs of active infection at this time. No fevers, chills, nausea, vomiting, or diarrhea. I do believe the compression wrap was beneficial for her. 08/28/2020 upon evaluation today patient appears to be doing well with regard to her wounds currently. Fortunately there does not appear to be any signs of active infection at this time. No fevers, chills, nausea, vomiting, or diarrhea. With that being said I think that her leg is doing quite well to be honest. 09/04/2020 upon evaluation today patient appears to be doing well with regard to her wound on the heel. This is showing signs of good epithelial growth although there is probably can it be an indention where this heals that is okay as long as we get it closed. Fortunately I do not see any evidence of infection at this point. 09/13/2020 upon evaluation today patient appears to be doing well with regard to her wounds. She has been tolerating the dressing changes Copley, Sparkles L. ([id number]) without complication. Fortunately he is actually doing extremely well and I think she is making great progress this is measuring smaller. I would recommend that such that we continue with the [facility] likely since he is doing so well. 09/18/2020 upon evaluation today patient appears to be doing well with regard to her heel ulcer. She is making good progress and I am very pleased with what we see today. I think the Hydrofera Blue is still doing excellent. 10/02/2020 upon evaluation today patient's wound actually appears to be showing signs of good improvement in regard to the heel. I am very pleased in this regard. With that being said I do believe that the area which is somewhat been stable and dry is starting to lift up and beginning to clear this away as well that is on the foot. Subsequently I am going to have to perform some debridement here and we did actually go ahead and allow this as a  wound today as before it was just more deep tissue injury and eschar covering now I think it is a little bit more than that to be honest. 10/09/2020 upon evaluation today patient appears to be doing decently well in regard to her wounds. I am actually very pleased with where things stand today. There does not appear to be any signs of active infection which is great news. No fevers, chills, nausea, vomiting, or diarrhea. She is going require some sharp debridement today. 10/16/2020 upon evaluation today patient appears to be doing decently well in regard to her heel as well as the foot. Both are showing signs of good improvement which is great news. In general and extremely pleased with where things stand at this point. She is tolerating the [facility] well although this is getting so small in the heel I think collagen may be a better option here based on what I am seeing. With regard to the foot the Iodoflex/Iodosorb is still probably the best method here. 10/26/2020 upon evaluation today patient actually appears to be doing well in some respects today. She has been tolerating the dressing changes without complication and this is great news. The Hydrofera Blue is done well for the heel this actually appears to be healed today. With that being said in regard to the foot I think we are ready to switch to [facility] based on what I see at  this point. 10/30/2020 upon evaluation today patient appears to be doing well with regard to his wound. He has been tolerating the dressing changes without complication. Fortunately there does not appear to be any signs of active infection systemically at this time which is great news and overall very pleased with where things stand today. I do think that she is making progress although it somewhat slow still where showing signs of improvement little by little here. 11/06/2020 upon evaluation today patient appears to be doing decently well in regard to her wound. We  will start and see better overall appearance of the base of the wound which is great news she still continues to have some abnormal fluorescence signals with a MolecuLight DX that will be detailed below. 11/20/2020 upon evaluation today patient's wound actually showing signs of improvement. There is good to be some need for sharp debridement today and that was discussed with the patient. I am going to go ahead and clean this up but I do believe the [facility] is doing a great job. 11/27/2020; the patient had eschar over the original heel wound which I removed with a #3 curette there was nothing open here. The area is on the lateral left foot foot. 12/04/2020 upon evaluation today patient appears to be doing well with regard to her wound. The wound bed actually appeared to be doing well after I remove the dry endoform from the wound bed. This I tracked little bit of fluid but nonetheless underneath this appears to be doing awesome. I am very pleased with where things stand today. No fevers, chills, nausea, vomiting, or diarrhea. Electronic Signature(s) Signed: 12/04/2020 1:13:00 PM By: Worthy Keeler PA-C Entered By: Worthy Keeler on 12/04/2020 13:13:00 Mudgett, Lashona L. ([id number]) -------------------------------------------------------------------------------- Physical Exam Details Patient Name: [name] [name], [name] Date of Service: 12/04/2020 11:30 AM Medical Record Number: [id number] Patient Account Number: [id number] Date of Birth/Sex: 08-10-1926 (87 y.o. F) Treating RN: Cornell Barman Primary Care Provider: [name] Other Clinician: Referring Provider: [name] Treating Provider/Extender: Skipper Cliche in Treatment: 54 Constitutional Well-nourished and well-hydrated in no acute distress. Respiratory normal breathing without difficulty. Psychiatric this patient is able to make decisions and demonstrates good insight into disease process. Alert and Oriented x 3.  pleasant and cooperative. Notes Upon inspection patient's wound bed showed signs of good granulation epithelization at this point. Fortunately there does not appear to be any evidence of active infection which is great news and overall I am extremely pleased with where we stand today. Electronic Signature(s) Signed: 12/04/2020 1:13:14 PM By: Worthy Keeler PA-C Entered By: Worthy Keeler on 12/04/2020 13:13:14 [name] [name], [name] ([id number]) -------------------------------------------------------------------------------- Physician Orders Details Patient Name: Tarlton, Paxton L. Date of Service: 12/04/2020 11:30 AM Medical Record Number: [id number] Patient Account Number: [id number] Date of Birth/Sex: 1926/06/21 (87 y.o. F) Treating RN: Cornell Barman Primary Care Provider: [name] Other Clinician: Referring Provider: [name] Treating Provider/Extender: Skipper Cliche in Treatment: 23 Verbal / Phone Orders: No Diagnosis Coding ICD-10 Coding Code Description [phone] Pressure ulcer of other site, stage 3 I10 Essential (primary) hypertension F01.50 Vascular dementia without behavioral disturbance I25.10 Atherosclerotic heart disease of native coronary artery without angina pectoris Follow-up Appointments o Return Appointment in 1 week. o Nurse Visit as needed Bathing/ Shower/ Hygiene o May shower with wound dressing protected with water repellent cover or cast protector. o No tub bath. Edema Control - Lymphedema / Segmental Compressive Device / Other o Patient to wear own compression stockings.  Remove compression stockings every night before going to bed and put on every morning when getting up. - right leg o Elevate legs to the level of the heart and pump ankles as often as possible o Elevate leg(s) parallel to the floor when sitting. o DO YOUR BEST to sleep in the bed at night. DO NOT sleep in your recliner. Long hours of sitting in a recliner leads  to swelling of the legs and/or potential wounds on your backside. Off-Loading o Other: - Offloading boots while in bed; Try pillow under sheet to keep it in place. Wound Treatment Wound #3 - Foot Wound Laterality: Left Cleanser: Soap and Water 1 x Per Week/30 Days Discharge Instructions: Gently cleanse wound with antibacterial soap, rinse and pat dry prior to dressing wounds Primary Dressing: Endoform 2x2 (in/in) 1 x Per Week/30 Days Discharge Instructions: Apply Endoform as directed moisten with hydroge Secondary Dressing: ABD Pad 5x9 (in/in) 1 x Per Week/30 Days Discharge Instructions: Cover with ABD pad Compression Wrap: Profore Lite LF 3 Multilayer Compression Bandaging System 1 x Per Week/30 Days Discharge Instructions: Apply 3 multi-layer wrap as prescribed. Electronic Signature(s) Signed: 12/04/2020 4:43:23 PM By: Worthy Keeler PA-C Signed: 12/04/2020 5:32:40 PM By: Gretta Cool, BSN, RN, CWS, Kim RN, BSN Entered By: Gretta Cool, BSN, RN, CWS, Kim on 12/04/2020 12:37:59 Dhondt, Shizue L. ([id number]) -------------------------------------------------------------------------------- Problem List Details Patient Name: [name] [name], [name] Date of Service: 12/04/2020 11:30 AM Medical Record Number: [id number] Patient Account Number: [id number] Date of Birth/Sex: 1926-10-25 (87 y.o. F) Treating RN: Cornell Barman Primary Care Provider: [name] Other Clinician: Referring Provider: [name] Treating Provider/Extender: Skipper Cliche in Treatment: 23 Active Problems ICD-10 Encounter Code Description Active Date MDM Diagnosis [phone] Pressure ulcer of other site, stage 3 10/02/2020 No Yes I10 Essential (primary) hypertension 06/26/2020 No Yes F01.50 Vascular dementia without behavioral disturbance 06/26/2020 No Yes I25.10 Atherosclerotic heart disease of native coronary artery without angina 06/26/2020 No Yes pectoris Inactive Problems Resolved Problems ICD-10 Code Description  Active Date Resolved Date L89.623 Pressure ulcer of left heel, stage 3 06/26/2020 06/26/2020 Electronic Signature(s) Signed: 12/04/2020 11:53:39 AM By: Worthy Keeler PA-C Entered By: Worthy Keeler on 12/04/2020 11:53:39 [name] [name], [name] ([id number]) -------------------------------------------------------------------------------- Progress Note Details Patient Name: Bendickson, Brooklinn L. Date of Service: 12/04/2020 11:30 AM Medical Record Number: [id number] Patient Account Number: [id number] Date of Birth/Sex: 12-Nov-1926 (87 y.o. F) Treating RN: Cornell Barman Primary Care Provider: [name] Other Clinician: Referring Provider: [name] Treating Provider/Extender: Skipper Cliche in Treatment: 23 Subjective Chief Complaint Information obtained from Patient Left Heel Pressure Ulcer History of Present Illness (HPI) 87 year old patient was recently seen by the PCPs office for significant pain right great toe which has been going on since July. She was initially treated with Keflex which she did not complete and after the office visit this time she has been put on doxycycline. at the time of her visit she was found to have a ulcer on the plantar surface of the right great toe and also had a pyogenic granuloma over this area. X-ray of the right foot done 12/29/2014 -- IMPRESSION:Soft-tissue swelling and ulceration right great toe. No underlying bony lytic lesion identified. If osteomyelitis remains of clinical concern MRI can be obtained. Past medical history significant for anemia, chronic kidney disease stage III, obesity, varicose veins, coronary artery disease, gout, history of nicotine addiction given up smoking in 2002, hypertension, status post cardiac catheterization, status post abdominal hysterectomy, cholecystectomy and tonsillectomy. hemoglobin A1c done in August  was 5.8 01/22/2015 -- at this stage the [facility] Walking boat was going to cost them significant amount of money and  they want to defer using that at the present time. 01/29/2015 -- she had a podiatry appointment and they have trimmed her toenails. She has not heard back from the vascular office regarding her venous duplex study and I have asked them to call personally so that they can get the appointment soon. 02/06/2015 -- they have made contact with the vascular office and from what I understand that test has been done but the report is pending. 02/19/2014 -- the vascular test is scheduled for tomorrow Readmission: 06/26/2020 upon evaluation today patient appears for initial evaluation in her clinic that she has been here before in 2016 and has been quite sometime. She did have a fractured hip in February on the 23rd 2022. Subsequently this had to be pinned and she ended up with a pressure injury on her heel following when she was using her foot to help move her around in the bed. Subsequently this has led to the wound that she has been dealing with since that time. She lives at home with her daughter currently she does have dementia. 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 07/03/2020 upon evaluation today patient appears to be doing well with regard to her heel ulcer. She did have arterial studies they appear to be doing excellent it was premature normal across the board with TBI's in the 90s and ABIs well within normal range. Nonetheless there does not appear to be any signs of arterial insufficiency whatsoever. With that being said the patient does have also signs of improvement there is some necrotic tissue in the base of the wound number to try to clear some of that away today I do believe the Iodoflex/Iodosorb is doing well. 5/24; difficult punched out wound on the left medial heel. She has been using Iodoflex 07/17/2020 upon evaluation today patient appears to be doing  well at this point in regard to her wound. I do feel like this is a little bit deeper but again that is what is expected as we continue with the Iodoflex I think that this is just going to get deeper until we get to the base of the wound. With that being said I think we are getting much closer to the base of the wound where we can have healthier tissue that we get a be managing here which that will be awesome. In the meantime I am not surprised by what I am seeing and in fact the wound appears to be better as compared to previous findings. 07/24/2020 upon evaluation today patient appears to be doing well with regard to her wound. Overall I am extremely pleased with where things stand today. I do not see any signs of active infection which is great and overall I think that the patient is making good progress. There is good to be some need for sharp debridement today. 07/31/2020 upon evaluation today patient appears to be doing better in regard to her heel ulcer. She has been tolerating the dressing changes without complication. Fortunately there does not appear to be any signs of active infection which is great and overall very pleased in this regard. No fevers, chills, nausea, vomiting, or diarrhea. 08/14/2020 upon evaluation today patient appears to be doing better in regard to her heel ulcer. I am  very pleased in that regard. Unfortunately she still has a slight deep tissue injury in regard to the medial portion of her foot over the same area. Unfortunately I think this is something that if were not careful it was can open up into the wound. That is my main concern here based on what I see. Fortunately there does not appear to be any evidence of active infection which is great news and overall very pleased with where things stand at this point. 08/21/2020 upon evaluation today patient appears to be doing well with regard to her wound. She has been tolerating the dressing changes without complication.  Fortunately there does not appear to be any signs of active infection at this time. No fevers, chills, nausea, vomiting, or diarrhea. I do believe the compression wrap was beneficial for her. 08/28/2020 upon evaluation today patient appears to be doing well with regard to her wounds currently. Fortunately there does not appear to be any signs of active infection at this time. No fevers, chills, nausea, vomiting, or diarrhea. With that being said I think that her leg is doing quite well to be honest. Vasconez, Osiris L. ([id number]) 09/04/2020 upon evaluation today patient appears to be doing well with regard to her wound on the heel. This is showing signs of good epithelial growth although there is probably can it be an indention where this heals that is okay as long as we get it closed. Fortunately I do not see any evidence of infection at this point. 09/13/2020 upon evaluation today patient appears to be doing well with regard to her wounds. She has been tolerating the dressing changes without complication. Fortunately he is actually doing extremely well and I think she is making great progress this is measuring smaller. I would recommend that such that we continue with the [facility] likely since he is doing so well. 09/18/2020 upon evaluation today patient appears to be doing well with regard to her heel ulcer. She is making good progress and I am very pleased with what we see today. I think the Hydrofera Blue is still doing excellent. 10/02/2020 upon evaluation today patient's wound actually appears to be showing signs of good improvement in regard to the heel. I am very pleased in this regard. With that being said I do believe that the area which is somewhat been stable and dry is starting to lift up and beginning to clear this away as well that is on the foot. Subsequently I am going to have to perform some debridement here and we did actually go ahead and allow this as a wound today as before it  was just more deep tissue injury and eschar covering now I think it is a little bit more than that to be honest. 10/09/2020 upon evaluation today patient appears to be doing decently well in regard to her wounds. I am actually very pleased with where things stand today. There does not appear to be any signs of active infection which is great news. No fevers, chills, nausea, vomiting, or diarrhea. She is going require some sharp debridement today. 10/16/2020 upon evaluation today patient appears to be doing decently well in regard to her heel as well as the foot. Both are showing signs of good improvement which is great news. In general and extremely pleased with where things stand at this point. She is tolerating the [facility] well although this is getting so small in the heel I think collagen may be a better option here based  on what I am seeing. With regard to the foot the Iodoflex/Iodosorb is still probably the best method here. 10/26/2020 upon evaluation today patient actually appears to be doing well in some respects today. She has been tolerating the dressing changes without complication and this is great news. The Hydrofera Blue is done well for the heel this actually appears to be healed today. With that being said in regard to the foot I think we are ready to switch to [facility] based on what I see at this point. 10/30/2020 upon evaluation today patient appears to be doing well with regard to his wound. He has been tolerating the dressing changes without complication. Fortunately there does not appear to be any signs of active infection systemically at this time which is great news and overall very pleased with where things stand today. I do think that she is making progress although it somewhat slow still where showing signs of improvement little by little here. 11/06/2020 upon evaluation today patient appears to be doing decently well in regard to her wound. We will start and see  better overall appearance of the base of the wound which is great news she still continues to have some abnormal fluorescence signals with a MolecuLight DX that will be detailed below. 11/20/2020 upon evaluation today patient's wound actually showing signs of improvement. There is good to be some need for sharp debridement today and that was discussed with the patient. I am going to go ahead and clean this up but I do believe the [facility] is doing a great job. 11/27/2020; the patient had eschar over the original heel wound which I removed with a #3 curette there was nothing open here. The area is on the lateral left foot foot. 12/04/2020 upon evaluation today patient appears to be doing well with regard to her wound. The wound bed actually appeared to be doing well after I remove the dry endoform from the wound bed. This I tracked little bit of fluid but nonetheless underneath this appears to be doing awesome. I am very pleased with where things stand today. No fevers, chills, nausea, vomiting, or diarrhea. Objective Constitutional Well-nourished and well-hydrated in no acute distress. Vitals Time Taken: 12:10 PM, Height: 58 in, Weight: 137 lbs, BMI: 28.6, Temperature: 97.6 F, Pulse: 80 bpm, Respiratory Rate: 16 breaths/min, Blood Pressure: 132/70 mmHg. Respiratory normal breathing without difficulty. Psychiatric this patient is able to make decisions and demonstrates good insight into disease process. Alert and Oriented x 3. pleasant and cooperative. General Notes: Upon inspection patient's wound bed showed signs of good granulation epithelization at this point. Fortunately there does not appear to be any evidence of active infection which is great news and overall I am extremely pleased with where we stand today. Integumentary (Hair, Skin) Wound #3 status is Open. Original cause of wound was Pressure Injury. The date acquired was: 10/02/2020. The wound has been in treatment 9 weeks.  The wound is located on the Left Foot. The wound measures 0.5cm length x 0.7cm width x 0.1cm depth; 0.275cm^2 area and 0.027cm^3 [name] [name], [name] ([id number]) volume. There is Fat Layer (Subcutaneous Tissue) exposed. There is no tunneling or undermining noted. There is a none present amount of drainage noted. The wound margin is thickened. There is no granulation within the wound bed. There is a large (67-100%) amount of necrotic tissue within the wound bed including Eschar. Assessment Active Problems ICD-10 Pressure ulcer of other site, stage 3 Essential (primary) hypertension Vascular dementia without behavioral  disturbance Atherosclerotic heart disease of native coronary artery without angina pectoris Procedures Wound #3 Pre-procedure diagnosis of Wound #3 is a Pressure Ulcer located on the Left Foot . There was a Excisional Skin/Subcutaneous Tissue Debridement with a total area of 0.35 sq cm performed by Tommie Sams., PA-C. With the following instrument(s): Curette to remove Viable and Non-Viable tissue/material. Material removed includes Eschar and Subcutaneous Tissue and. No specimens were taken. A time out was conducted at 12:38, prior to the start of the procedure. A Minimum amount of bleeding was controlled with Pressure. The procedure was tolerated well. Post Debridement Measurements: 0.5cm length x 0.7cm width x 0.1cm depth; 0.027cm^3 volume. Post debridement Stage noted as Category/Stage III. Character of Wound/Ulcer Post Debridement is stable. Post procedure Diagnosis Wound #3: Same as Pre-Procedure Plan Follow-up Appointments: Return Appointment in 1 week. Nurse Visit as needed Bathing/ Shower/ Hygiene: May shower with wound dressing protected with water repellent cover or cast protector. No tub bath. Edema Control - Lymphedema / Segmental Compressive Device / Other: Patient to wear own compression stockings. Remove compression stockings every night before going to bed  and put on every morning when getting up. - right leg Elevate legs to the level of the heart and pump ankles as often as possible Elevate leg(s) parallel to the floor when sitting. DO YOUR BEST to sleep in the bed at night. DO NOT sleep in your recliner. Long hours of sitting in a recliner leads to swelling of the legs and/or potential wounds on your backside. Off-Loading: Other: - Offloading boots while in bed; Try pillow under sheet to keep it in place. WOUND #3: - Foot Wound Laterality: Left Cleanser: Soap and Water 1 x Per Week/30 Days Discharge Instructions: Gently cleanse wound with antibacterial soap, rinse and pat dry prior to dressing wounds Primary Dressing: Endoform 2x2 (in/in) 1 x Per Week/30 Days Discharge Instructions: Apply Endoform as directed moisten with hydroge Secondary Dressing: ABD Pad 5x9 (in/in) 1 x Per Week/30 Days Discharge Instructions: Cover with ABD pad Compression Wrap: Profore Lite LF 3 Multilayer Compression Bandaging System 1 x Per Week/30 Days Discharge Instructions: Apply 3 multi-layer wrap as prescribed. 1. Would recommend that we going continue with the wound care measures as before and the patient is in agreement with that plan this includes the use of the endoform to the wound bed which I think is doing a good job we will cover this with an ABD pad. 2. I am also can recommend that we continue with a 3 layer compression wrap. We will see patient back for reevaluation in 1 week here in the clinic. If anything worsens or changes patient will contact our office for additional recommendations. Wilkowski, Keyairra L. ([id number]) Electronic Signature(s) Signed: 12/04/2020 1:13:37 PM By: Worthy Keeler PA-C Entered By: Worthy Keeler on 12/04/2020 13:13:36 Terpening, Gladys L. ([id number]) -------------------------------------------------------------------------------- SuperBill Details Patient Name: Gongaware, Amada L. Date of Service: 12/04/2020 Medical  Record Number: [id number] Patient Account Number: [id number] Date of Birth/Sex: April 10, 1926 (87 y.o. F) Treating RN: Cornell Barman Primary Care Provider: [name] Other Clinician: Referring Provider: [name] Treating Provider/Extender: Skipper Cliche in Treatment: 23 Diagnosis Coding ICD-10 Codes Code Description [phone] Pressure ulcer of other site, stage 3 I10 Essential (primary) hypertension F01.50 Vascular dementia without behavioral disturbance I25.10 Atherosclerotic heart disease of native coronary artery without angina pectoris Facility Procedures CPT4 Code: [id number] Description: [id number] - DEB SUBQ TISSUE 20 SQ CM/< Modifier: Quantity: 1 CPT4 Code: Description: ICD-10 Diagnosis Description L89.893 Pressure  ulcer of other site, stage 3 Modifier: Quantity: Physician Procedures CPT4 Code: [id number] Description: 11042 - WC PHYS SUBQ TISS 20 SQ CM Modifier: Quantity: 1 CPT4 Code: Description: ICD-10 Diagnosis Description L89.893 Pressure ulcer of other site, stage 3 Modifier: Quantity: Electronic Signature(s) Signed: 12/04/2020 1:14:03 PM By: Worthy Keeler PA-C Entered By: Worthy Keeler on 12/04/2020 13:14:02

## 2020-12-04 NOTE — Progress Notes (Addendum)
Liverman, Toshua L. ([id number]) Visit Report for 12/04/2020 Arrival Information Details Patient Name: [name] [name], [name] Date of Service: 12/04/2020 11:30 AM Medical Record Number: [id number] Patient Account Number: [id number] Date of Birth/Sex: [birth year] (87 y.o. F) Treating RN: Cornell Barman Primary Care Joseh Sjogren: [name] Other Clinician: Referring Lakea Mittelman: [name] Treating Kao Conry/Extender: Skipper Cliche in Treatment: 23 Visit Information History Since Last Visit Added or deleted any medications: Yes Patient Arrived: Wheel Chair Any new allergies or adverse reactions: No Arrival Time: 12:10 Had a fall or experienced change in No Accompanied By: daughter activities of daily living that may affect Transfer Assistance: None risk of falls: Patient Identification Verified: Yes Signs or symptoms of abuse/neglect since last visito No Secondary Verification Process Completed: Yes Hospitalized since last visit: No Patient Requires Transmission-Based No Pain Present Now: No Precautions: Patient Has Alerts: Yes Patient Alerts: NOT diabetic ABI R1.18 L1.25 06/27/20 Electronic Signature(s) Signed: 12/04/2020 2:06:56 PM By: Enedina Finner RCP, RRT, CHT Entered By: Stark Jock, Amado Nash on 12/04/2020 12:12:28 Rexroad, Zendayah L. ([id number]) -------------------------------------------------------------------------------- Encounter Discharge Information Details Patient Name: Santosuosso, Mairi L. Date of Service: 12/04/2020 11:30 AM Medical Record Number: [id number] Patient Account Number: [id number] Date of Birth/Sex: 18-Oct-1926 (87 y.o. F) Treating RN: Cornell Barman Primary Care Trev Boley: [name] Other Clinician: Referring Emelee Rodocker: [name] Treating Shaelee Forni/Extender: Skipper Cliche in Treatment: 23 Encounter Discharge Information Items Post Procedure Vitals Discharge Condition: Stable Temperature (F): 97.6 Ambulatory Status:  Ambulatory Pulse (bpm): 80 Discharge Destination: Home Respiratory Rate (breaths/min): 16 Transportation: Private Auto Blood Pressure (mmHg): 132/70 Accompanied By: daughter Schedule Follow-up Appointment: Yes Clinical Summary of Care: Electronic Signature(s) Signed: 12/04/2020 5:32:40 PM By: Gretta Cool, BSN, RN, CWS, Kim RN, BSN Entered By: Gretta Cool, BSN, RN, CWS, Kim on 12/04/2020 12:40:22 Llorens, Tysha L. ([id number]) -------------------------------------------------------------------------------- Lower Extremity Assessment Details Patient Name: Uyeno, Aiysha L. Date of Service: 12/04/2020 11:30 AM Medical Record Number: [id number] Patient Account Number: [id number] Date of Birth/Sex: 30-May-1926 (87 y.o. F) Treating RN: Cornell Barman Primary Care Adilynn [name]: [name] Other Clinician: Referring Rosey Eide: [name] Treating Bernetha Anschutz/Extender: Skipper Cliche in Treatment: 23 Edema Assessment Assessed: [Left: No] Patrice Paradise: No] [Left: Edema] [Right: :] Calf Left: Right: Point of Measurement: 32 cm From Medial Instep 33 cm Ankle Left: Right: Point of Measurement: 12 cm From Medial Instep 23 cm Vascular Assessment Pulses: Dorsalis Pedis Palpable: [Left:Yes] Electronic Signature(s) Signed: 12/04/2020 5:32:40 PM By: Gretta Cool, BSN, RN, CWS, Kim RN, BSN Entered By: Gretta Cool, BSN, RN, CWS, Kim on 12/04/2020 12:23:09 Spatz, Kenosha L. ([id number]) -------------------------------------------------------------------------------- Multi Wound Chart Details Patient Name: Lariviere, Amazin L. Date of Service: 12/04/2020 11:30 AM Medical Record Number: [id number] Patient Account Number: [id number] Date of Birth/Sex: December 25, 1926 (87 y.o. F) Treating RN: Cornell Barman Primary Care Devansh Riese: [name] Other Clinician: Referring Diem Dicocco: [name] Treating Ferdie Bakken/Extender: Skipper Cliche in Treatment: 23 Vital Signs Height(in): 58 Pulse(bpm): 80 Weight(lbs): 137 Blood  Pressure(mmHg): 132/70 Body Mass Index(BMI): 29 Temperature(F): 97.6 Respiratory Rate(breaths/min): 16 Photos: [3:No Photos] [N/A:N/A] Wound Location: [3:Left Foot] [N/A:N/A] Wounding Event: [3:Pressure Injury] [N/A:N/A] Primary Etiology: [3:Pressure Ulcer] [N/A:N/A] Comorbid History: [3:Cataracts, Anemia, Coronary Artery Disease, Hypertension, Myocardial Infarction, End Stage Renal Disease, Gout, Osteoarthritis, Dementia] [N/A:N/A] Date Acquired: [3:10/02/2020] [N/A:N/A] Weeks of Treatment: [3:9] [N/A:N/A] Wound Status: [3:Open] [N/A:N/A] Measurements L x W x D (cm) [3:0.5x0.7x0.1] [N/A:N/A] Area (cm) : [3:0.275] [N/A:N/A] Volume (cm) : [3:0.027] [N/A:N/A] % Reduction in Area: [3:12.40%] [N/A:N/A] % Reduction in Volume: [3:12.90%] [N/A:N/A] Classification: [3:Category/Stage III] [N/A:N/A] Exudate Amount: [3:None Present] [N/A:N/A] Wound Margin: [3:Thickened] [  N/A:N/A] Granulation Amount: [3:None Present (0%)] [N/A:N/A] Necrotic Amount: [3:Large (67-100%)] [N/A:N/A] Necrotic Tissue: [3:Eschar] [N/A:N/A] Exposed Structures: [3:Fat Layer (Subcutaneous Tissue): Yes Fascia: No Tendon: No Muscle: No Joint: No Bone: No] [N/A:N/A] Treatment Notes Electronic Signature(s) Signed: 12/04/2020 5:32:40 PM By: Gretta Cool, BSN, RN, CWS, Kim RN, BSN Entered By: Gretta Cool, BSN, RN, CWS, Kim on 12/04/2020 12:25:30 Kijowski, Madhuri L. ([id number]) -------------------------------------------------------------------------------- [place] Details Patient Name: Montminy, Aneesah L. Date of Service: 12/04/2020 11:30 AM Medical Record Number: [id number] Patient Account Number: [id number] Date of Birth/Sex: [birth year] (87 y.o. F) Treating RN: Cornell Barman Primary Care Velencia Lenart: [name] Other Clinician: Referring Ruberta Holck: [name] Treating Evangelene Vora/Extender: Skipper Cliche in Treatment: 23 Active Inactive Medication Nursing Diagnoses: Knowledge deficit related to medication  safety: actual or potential Goals: Patient/caregiver will demonstrate understanding of all current medications Date Initiated: 12/04/2020 Target Resolution Date: 12/04/2020 Goal Status: Active Interventions: Assess for medication contraindications each visit where new medications are prescribed Assess patient/caregiver ability to manage medication regimen upon admission and as needed Notes: Electronic Signature(s) Signed: 12/04/2020 5:32:40 PM By: Gretta Cool, BSN, RN, CWS, Kim RN, BSN Entered By: Gretta Cool, BSN, RN, CWS, Kim on 12/04/2020 12:25:08 Salsgiver, Rylee L. ([id number]) -------------------------------------------------------------------------------- Pain Assessment Details Patient Name: Payson, Teya L. Date of Service: 12/04/2020 11:30 AM Medical Record Number: [id number] Patient Account Number: [id number] Date of Birth/Sex: 06/28/1926 (87 y.o. F) Treating RN: Cornell Barman Primary Care Carmella Kees: [name] Other Clinician: Referring Zamia Tyminski: [name] Treating Cyana Shook/Extender: Skipper Cliche in Treatment: 23 Active Problems Location of Pain Severity and Description of Pain Patient Has Paino No Site Locations Pain Management and Medication Current Pain Management: Notes Patient denies pain at this time. Electronic Signature(s) Signed: 12/04/2020 5:32:40 PM By: Gretta Cool, BSN, RN, CWS, Kim RN, BSN Entered By: Gretta Cool, BSN, RN, CWS, Kim on 12/04/2020 12:18:04 Toutant, Skie L. ([id number]) -------------------------------------------------------------------------------- Patient/Caregiver Education Details Patient Name: Stann, Karan L. Date of Service: 12/04/2020 11:30 AM Medical Record Number: [id number] Patient Account Number: [id number] Date of Birth/Gender: 1926-02-23 (87 y.o. F) Treating RN: Cornell Barman Primary Care Physician: [name] Other Clinician: Referring Physician: [name] Treating Physician/Extender: Skipper Cliche in Treatment:  23 Education Assessment Education Provided To: Patient Education Topics Provided Wound/Skin Impairment: Handouts: Caring for Your Ulcer Methods: Demonstration, Explain/Verbal Responses: State content correctly Electronic Signature(s) Signed: 12/04/2020 5:32:40 PM By: Gretta Cool, BSN, RN, CWS, Kim RN, BSN Entered By: Gretta Cool, BSN, RN, CWS, Kim on 12/04/2020 12:39:32 Starace, Dalina L. ([id number]) -------------------------------------------------------------------------------- Wound Assessment Details Patient Name: Lang, Shakeyla L. Date of Service: 12/04/2020 11:30 AM Medical Record Number: [id number] Patient Account Number: [id number] Date of Birth/Sex: [birth year] (87 y.o. F) Treating RN: Cornell Barman Primary Care Violette Morneault: [name] Other Clinician: Referring Nyree Applegate: [name] Treating Shatana Saxton/Extender: Skipper Cliche in Treatment: 23 Wound Status Wound Number: 3 Primary Pressure Ulcer Etiology: Wound Location: Left Foot Wound Open Wounding Event: Pressure Injury Status: Date Acquired: 10/02/2020 Comorbid Cataracts, Anemia, Coronary Artery Disease, Weeks Of Treatment: 9 History: Hypertension, Myocardial Infarction, End Stage Renal Clustered Wound: No Disease, Gout, Osteoarthritis, Dementia Photos Photo Uploaded By: Gretta Cool, BSN, RN, CWS, Kim on 12/04/2020 16:16:35 Wound Measurements Length: (cm) 0.5 Width: (cm) 0.7 Depth: (cm) 0.1 Area: (cm) 0.275 Volume: (cm) 0.027 % Reduction in Area: 12.4% % Reduction in Volume: 12.9% Tunneling: No Undermining: No Wound Description Classification: Category/Stage III Wound Margin: Thickened Exudate Amount: None Present Foul Odor After Cleansing: No Slough/Fibrino Yes Wound Bed Granulation Amount: None Present (0%) Exposed Structure Necrotic Amount: Large (67-100%) Fascia Exposed: No Necrotic  Quality: Eschar Fat Layer (Subcutaneous Tissue) Exposed: Yes Tendon Exposed: No Muscle Exposed: No Joint Exposed: No Bone  Exposed: No Treatment Notes Wound #3 (Foot) Wound Laterality: Left Cleanser Soap and Water Discharge Instruction: Gently cleanse wound with antibacterial soap, rinse and pat dry prior to dressing wounds Peri-Wound Care [place], Wolfe ([id number]) Topical Primary Dressing Endoform 2x2 (in/in) Discharge Instruction: Apply Endoform as directed moisten with hydroge Secondary Dressing ABD Pad 5x9 (in/in) Discharge Instruction: Cover with ABD pad Secured With Compression Wrap Profore Lite LF 3 Multilayer Compression Bandaging System Discharge Instruction: Apply 3 multi-layer wrap as prescribed. Compression Stockings Environmental education officer) Signed: 12/04/2020 5:32:40 PM By: Gretta Cool, BSN, RN, CWS, Kim RN, BSN Entered By: Gretta Cool, BSN, RN, CWS, Kim on 12/04/2020 12:19:51 Kyllonen, Chester ([id number]) -------------------------------------------------------------------------------- Vitals Details Patient Name: Herford, Zyion L. Date of Service: 12/04/2020 11:30 AM Medical Record Number: [id number] Patient Account Number: [id number] Date of Birth/Sex: [birth year] (87 y.o. F) Treating RN: Cornell Barman Primary Care Leeandra Ellerson: [name] Other Clinician: Referring Rocio Roam: [name] Treating Rylee Nuzum/Extender: Skipper Cliche in Treatment: 23 Vital Signs Time Taken: 12:10 Temperature (F): 97.6 Height (in): 58 Pulse (bpm): 80 Weight (lbs): 137 Respiratory Rate (breaths/min): 16 Body Mass Index (BMI): 28.6 Blood Pressure (mmHg): 132/70 Reference Range: 80 - 120 mg / dl Electronic Signature(s) Signed: 12/04/2020 2:06:56 PM By: Enedina Finner RCP, RRT, CHT Entered By: Enedina Finner on 12/04/2020 12:13:00

## 2020-12-05 NOTE — Addendum Note (Signed)
Addended by: Ashley Royalty E on: 12/05/2020 02:16 PM   Modules accepted: Orders

## 2020-12-11 ENCOUNTER — Encounter: Payer: PPO | Admitting: Physician Assistant

## 2020-12-11 ENCOUNTER — Other Ambulatory Visit: Payer: Self-pay

## 2020-12-11 DIAGNOSIS — L89893 Pressure ulcer of other site, stage 3: Secondary | ICD-10-CM | POA: Diagnosis not present

## 2020-12-11 NOTE — Progress Notes (Addendum)
Dupin, Alleen L. ([id number]) Visit Report for 12/11/2020 Chief Complaint Document Details Patient Name: [name] [name], [name] Date of Service: 12/11/2020 11:30 AM Medical Record Number: [id number] Patient Account Number: [id number] Date of Birth/Sex: September 03, 1926 (87 y.o. F) Treating RN: Donnamarie Poag Primary Care Provider: [name] Other Clinician: Referring Provider: [name] Treating Provider/Extender: Skipper Cliche in Treatment: 24 Information Obtained from: Patient Chief Complaint Left Heel Pressure Ulcer Electronic Signature(s) Signed: 12/11/2020 11:44:04 AM By: Worthy Keeler PA-C Entered By: Worthy Keeler on 12/11/2020 11:44:04 [name] [name], [name] ([id number]) -------------------------------------------------------------------------------- Debridement Details Patient Name: [name] [name], [name] Date of Service: 12/11/2020 11:30 AM Medical Record Number: [id number] Patient Account Number: [id number] Date of Birth/Sex: 03-Oct-1926 (87 y.o. F) Treating RN: Donnamarie Poag Primary Care Provider: [name] Other Clinician: Referring Provider: [name] Treating Provider/Extender: Skipper Cliche in Treatment: 24 Debridement Performed for Wound #3 Left Foot Assessment: Performed By: Physician Tommie Sams., PA-C Debridement Type: Debridement Level of Consciousness (Pre- Awake and Alert procedure): Pre-procedure Verification/Time Out Yes - 11:57 Taken: Start Time: 11:57 Pain Control: Lidocaine Total Area Debrided (L x W): 0.5 (cm) x 0.5 (cm) = 0.25 (cm) Tissue and other material Callus, Eschar, Slough, Subcutaneous, Slough debrided: Level: Skin/Subcutaneous Tissue Debridement Description: Excisional Instrument: Curette Bleeding: Moderate Hemostasis Achieved: Other Topical Anticoagulant : Response to Treatment: Procedure was tolerated well Level of Consciousness (Post- Awake and Alert procedure): Post Debridement Measurements of Total  Wound Length: (cm) 0.5 Stage: Category/Stage III Width: (cm) 0.6 Depth: (cm) 0.2 Volume: (cm) 0.047 Character of Wound/Ulcer Post Debridement: Improved Post Procedure Diagnosis Same as Pre-procedure Electronic Signature(s) Signed: 12/11/2020 4:40:41 PM By: Donnamarie Poag Signed: 12/12/2020 4:54:49 PM By: Worthy Keeler PA-C Entered By: Donnamarie Poag on 12/11/2020 11:59:20 [name] [name], [name] ([id number]) -------------------------------------------------------------------------------- HPI Details Patient Name: [name], [name] Date of Service: 12/11/2020 11:30 AM Medical Record Number: [id number] Patient Account Number: [id number] Date of Birth/Sex: [birth year] (87 y.o. F) Treating RN: Donnamarie Poag Primary Care Provider: [name] Other Clinician: Referring Provider: [name] Treating Provider/Extender: Skipper Cliche in Treatment: 24 History of Present Illness HPI Description: 87 year old patient was recently seen by the PCPs office for significant pain right great toe which has been going on since July. She was initially treated with Keflex which she did not complete and after the office visit this time she has been put on doxycycline. at the time of her visit she was found to have a ulcer on the plantar surface of the right great toe and also had a pyogenic granuloma over this area. X-ray of the right foot done 12/29/2014 -- IMPRESSION:Soft-tissue swelling and ulceration right great toe. No underlying bony lytic lesion identified. If osteomyelitis remains of clinical concern MRI can be obtained. Past medical history significant for anemia, chronic kidney disease stage III, obesity, varicose veins, coronary artery disease, gout, history of nicotine addiction given up smoking in 2002, hypertension, status post cardiac catheterization, status post abdominal hysterectomy, cholecystectomy and tonsillectomy. hemoglobin A1c done in August was 5.8 01/22/2015 -- at this stage the  [facility] Walking boat was going to cost them significant amount of money and they want to defer using that at the present time. 01/29/2015 -- she had a podiatry appointment and they have trimmed her toenails. She has not heard back from the vascular office regarding her venous duplex study and I have asked them to call personally so that they can get the appointment soon. 02/06/2015 -- they have made contact with the vascular office and from  what I understand that test has been done but the report is pending. 02/19/2014 -- the vascular test is scheduled for tomorrow Readmission: 06/26/2020 upon evaluation today patient appears for initial evaluation in her clinic that she has been here before in 2016 and has been quite sometime. She did have a fractured hip in February on the 23rd 2022. Subsequently this had to be pinned and she ended up with a pressure injury on her heel following when she was using her foot to help move her around in the bed. Subsequently this has led to the wound that she has been dealing with since that time. She lives at home with her daughter currently she does have dementia. 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 07/03/2020 upon evaluation today patient appears to be doing well with regard to her heel ulcer. She did have arterial studies they appear to be doing excellent it was premature normal across the board with TBI's in the 90s and ABIs well within normal range. Nonetheless there does not appear to be any signs of arterial insufficiency whatsoever. With that being said the patient does have also signs of improvement there is some necrotic tissue in the base of the wound number to try to clear some of that away today I do believe the Iodoflex/Iodosorb is doing well. 5/24; difficult punched out wound on the left medial heel. She has been using  Iodoflex 07/17/2020 upon evaluation today patient appears to be doing well at this point in regard to her wound. I do feel like this is a little bit deeper but again that is what is expected as we continue with the Iodoflex I think that this is just going to get deeper until we get to the base of the wound. With that being said I think we are getting much closer to the base of the wound where we can have healthier tissue that we get a be managing here which that will be awesome. In the meantime I am not surprised by what I am seeing and in fact the wound appears to be better as compared to previous findings. 07/24/2020 upon evaluation today patient appears to be doing well with regard to her wound. Overall I am extremely pleased with where things stand today. I do not see any signs of active infection which is great and overall I think that the patient is making good progress. There is good to be some need for sharp debridement today. 07/31/2020 upon evaluation today patient appears to be doing better in regard to her heel ulcer. She has been tolerating the dressing changes without complication. Fortunately there does not appear to be any signs of active infection which is great and overall very pleased in this regard. No fevers, chills, nausea, vomiting, or diarrhea. 08/14/2020 upon evaluation today patient appears to be doing better in regard to her heel ulcer. I am very pleased in that regard. Unfortunately she still has a slight deep tissue injury in regard to the medial portion of her foot over the same area. Unfortunately I think this is something that if were not careful it was can open up into the wound. That is my main concern here based on what I see. Fortunately there does not appear to be any evidence of active infection which is great news and overall very pleased with where things stand at this point. 08/21/2020 upon evaluation  today patient appears to be doing well with regard to her wound.  She has been tolerating the dressing changes without complication. Fortunately there does not appear to be any signs of active infection at this time. No fevers, chills, nausea, vomiting, or diarrhea. I do believe the compression wrap was beneficial for her. 08/28/2020 upon evaluation today patient appears to be doing well with regard to her wounds currently. Fortunately there does not appear to be any signs of active infection at this time. No fevers, chills, nausea, vomiting, or diarrhea. With that being said I think that her leg is doing quite well to be honest. 09/04/2020 upon evaluation today patient appears to be doing well with regard to her wound on the heel. This is showing signs of good epithelial growth although there is probably can it be an indention where this heals that is okay as long as we get it closed. Fortunately I do not see any evidence of infection at this point. 09/13/2020 upon evaluation today patient appears to be doing well with regard to her wounds. She has been tolerating the dressing changes [name] [name], [name] ([id number]) without complication. Fortunately he is actually doing extremely well and I think she is making great progress this is measuring smaller. I would recommend that such that we continue with the [facility] likely since he is doing so well. 09/18/2020 upon evaluation today patient appears to be doing well with regard to her heel ulcer. She is making good progress and I am very pleased with what we see today. I think the Hydrofera Blue is still doing excellent. 10/02/2020 upon evaluation today patient's wound actually appears to be showing signs of good improvement in regard to the heel. I am very pleased in this regard. With that being said I do believe that the area which is somewhat been stable and dry is starting to lift up and beginning to clear this away as well that is on the foot. Subsequently I am going to have to perform some debridement here and we  did actually go ahead and allow this as a wound today as before it was just more deep tissue injury and eschar covering now I think it is a little bit more than that to be honest. 10/09/2020 upon evaluation today patient appears to be doing decently well in regard to her wounds. I am actually very pleased with where things stand today. There does not appear to be any signs of active infection which is great news. No fevers, chills, nausea, vomiting, or diarrhea. She is going require some sharp debridement today. 10/16/2020 upon evaluation today patient appears to be doing decently well in regard to her heel as well as the foot. Both are showing signs of good improvement which is great news. In general and extremely pleased with where things stand at this point. She is tolerating the [facility] well although this is getting so small in the heel I think collagen may be a better option here based on what I am seeing. With regard to the foot the Iodoflex/Iodosorb is still probably the best method here. 10/26/2020 upon evaluation today patient actually appears to be doing well in some respects today. She has been tolerating the dressing changes without complication and this is great news. The Hydrofera Blue is done well for the heel this actually appears to be healed today. With that being said in regard to the foot I think we are ready to switch to [facility] based on what I  see at this point. 10/30/2020 upon evaluation today patient appears to be doing well with regard to his wound. He has been tolerating the dressing changes without complication. Fortunately there does not appear to be any signs of active infection systemically at this time which is great news and overall very pleased with where things stand today. I do think that she is making progress although it somewhat slow still where showing signs of improvement little by little here. 11/06/2020 upon evaluation today patient appears to be  doing decently well in regard to her wound. We will start and see better overall appearance of the base of the wound which is great news she still continues to have some abnormal fluorescence signals with a MolecuLight DX that will be detailed below. 11/20/2020 upon evaluation today patient's wound actually showing signs of improvement. There is good to be some need for sharp debridement today and that was discussed with the patient. I am going to go ahead and clean this up but I do believe the [facility] is doing a great job. 11/27/2020; the patient had eschar over the original heel wound which I removed with a #3 curette there was nothing open here. The area is on the lateral left foot foot. 12/04/2020 upon evaluation today patient appears to be doing well with regard to her wound. The wound bed actually appeared to be doing well after I remove the dry endoform from the wound bed. This I tracked little bit of fluid but nonetheless underneath this appears to be doing awesome. I am very pleased with where things stand today. No fevers, chills, nausea, vomiting, or diarrhea. 12/11/2020 upon evaluation today patient appears to be doing well with regard to her wound although it keeps getting dry and filled up with the endoform I am not seeing any signs of infection but we are going to have to clean this away in order to try and get things moving in an appropriate direction. I discussed that with patient's daughter today as well. She is in agreement with proceeding as such. Electronic Signature(s) Signed: 12/11/2020 1:46:25 PM By: Worthy Keeler PA-C Entered By: Worthy Keeler on 12/11/2020 13:46:24 [name] [name], [name] ([id number]) -------------------------------------------------------------------------------- Physical Exam Details Patient Name: Ginther, Zanae L. Date of Service: 12/11/2020 11:30 AM Medical Record Number: [id number] Patient Account Number: [id number] Date of Birth/Sex:  [birth year] (87 y.o. F) Treating RN: Donnamarie Poag Primary Care Provider: [name] Other Clinician: Referring Provider: [name] Treating Provider/Extender: Skipper Cliche in Treatment: 55 Constitutional Well-nourished and well-hydrated in no acute distress. Respiratory normal breathing without difficulty. Psychiatric this patient is able to make decisions and demonstrates good insight into disease process. Alert and Oriented x 3. pleasant and cooperative. Notes Upon inspection patient's wound bed did require some sharp debridement to clear away some of the dry callus and necrotic debris on the surface of the wound once this was debrided it looked good underneath but does seem to be having trouble filling in due to the fact that keeps callusing over. For that reason I think we can switch over to Xeroform gauze to try to see how this will do. Electronic Signature(s) Signed: 12/11/2020 5:32:46 PM By: Worthy Keeler PA-C Entered By: Worthy Keeler on 12/11/2020 17:32:46 [name] [name], [name] ([id number]) -------------------------------------------------------------------------------- Physician Orders Details Patient Name: [name] [name], [name] Date of Service: 12/11/2020 11:30 AM Medical Record Number: [id number] Patient Account Number: [id number] Date of Birth/Sex: 1926-05-01 (87 y.o. F) Treating RN: Donnamarie Poag Primary Care Provider: Caryn Section,  Elenore Rota Other Clinician: Referring Provider: [name] Treating Provider/Extender: Skipper Cliche in Treatment: 24 Verbal / Phone Orders: No Diagnosis Coding ICD-10 Coding Code Description 8201830977 Pressure ulcer of other site, stage 3 I10 Essential (primary) hypertension F01.50 Vascular dementia without behavioral disturbance I25.10 Atherosclerotic heart disease of native coronary artery without angina pectoris Follow-up Appointments o Return Appointment in 1 week. o Nurse Visit as needed Bathing/ Shower/ Hygiene o May  shower with wound dressing protected with water repellent cover or cast protector. o No tub bath. Edema Control - Lymphedema / Segmental Compressive Device / Other o Optional: One layer of unna paste to top of compression wrap (to act as an anchor). o Patient to wear own compression stockings. Remove compression stockings every night before going to bed and put on every morning when getting up. - right leg o Elevate legs to the level of the heart and pump ankles as often as possible o Elevate leg(s) parallel to the floor when sitting. o DO YOUR BEST to sleep in the bed at night. DO NOT sleep in your recliner. Long hours of sitting in a recliner leads to swelling of the legs and/or potential wounds on your backside. Off-Loading o Other: - Offloading boots while in bed; Try pillow under sheet to keep it in place. Wound Treatment Wound #3 - Foot Wound Laterality: Left Cleanser: Soap and Water 1 x Per Week/30 Days Discharge Instructions: Gently cleanse wound with antibacterial soap, rinse and pat dry prior to dressing wounds Secondary Dressing: ABD Pad 5x9 (in/in) 1 x Per Week/30 Days Discharge Instructions: Cover with ABD pad Secondary Dressing: Xeroform Petrolatum Gauze, Overwrap 1x8 (in/in) 1 x Per Week/30 Days Compression Wrap: Profore Lite LF 3 Multilayer Compression Bandaging System 1 x Per Week/30 Days Discharge Instructions: Apply 3 multi-layer wrap as prescribed. Electronic Signature(s) Signed: 12/11/2020 4:40:41 PM By: Donnamarie Poag Signed: 12/12/2020 4:54:49 PM By: Worthy Keeler PA-C Entered By: Donnamarie Poag on 12/11/2020 12:00:51 [name] [name], [name] ([id number]) -------------------------------------------------------------------------------- Problem List Details Patient Name: [name] [name], [name] Date of Service: 12/11/2020 11:30 AM Medical Record Number: [id number] Patient Account Number: [id number] Date of Birth/Sex: 16-Feb-1927 (87 y.o. F) Treating RN: Donnamarie Poag Primary Care Provider: [name] Other Clinician: Referring Provider: [name] Treating Provider/Extender: Skipper Cliche in Treatment: 24 Active Problems ICD-10 Encounter Code Description Active Date MDM Diagnosis [id number] Pressure ulcer of other site, stage 3 10/02/2020 No Yes I10 Essential (primary) hypertension 06/26/2020 No Yes F01.50 Vascular dementia without behavioral disturbance 06/26/2020 No Yes I25.10 Atherosclerotic heart disease of native coronary artery without angina 06/26/2020 No Yes pectoris Inactive Problems Resolved Problems ICD-10 Code Description Active Date Resolved Date L89.623 Pressure ulcer of left heel, stage 3 06/26/2020 06/26/2020 Electronic Signature(s) Signed: 12/11/2020 11:43:58 AM By: Worthy Keeler PA-C Entered By: Worthy Keeler on 12/11/2020 11:43:58 [name] [name], [name] ([id number]) -------------------------------------------------------------------------------- Progress Note Details Patient Name: [name] [name], [name] Date of Service: 12/11/2020 11:30 AM Medical Record Number: [id number] Patient Account Number: [id number] Date of Birth/Sex: 1926-03-20 (87 y.o. F) Treating RN: Donnamarie Poag Primary Care Provider: [name] Other Clinician: Referring Provider: [name] Treating Provider/Extender: Skipper Cliche in Treatment: 24 Subjective Chief Complaint Information obtained from Patient Left Heel Pressure Ulcer History of Present Illness (HPI) 87 year old patient was recently seen by the PCPs office for significant pain right great toe which has been going on since July. She was initially treated with Keflex which she did not complete and after the office visit this time she has been  put on doxycycline. at the time of her visit she was found to have a ulcer on the plantar surface of the right great toe and also had a pyogenic granuloma over this area. X-ray of the right foot done 12/29/2014 -- IMPRESSION:Soft-tissue  swelling and ulceration right great toe. No underlying bony lytic lesion identified. If osteomyelitis remains of clinical concern MRI can be obtained. Past medical history significant for anemia, chronic kidney disease stage III, obesity, varicose veins, coronary artery disease, gout, history of nicotine addiction given up smoking in 2002, hypertension, status post cardiac catheterization, status post abdominal hysterectomy, cholecystectomy and tonsillectomy. hemoglobin A1c done in August was 5.8 01/22/2015 -- at this stage the [facility] Walking boat was going to cost them significant amount of money and they want to defer using that at the present time. 01/29/2015 -- she had a podiatry appointment and they have trimmed her toenails. She has not heard back from the vascular office regarding her venous duplex study and I have asked them to call personally so that they can get the appointment soon. 02/06/2015 -- they have made contact with the vascular office and from what I understand that test has been done but the report is pending. 02/19/2014 -- the vascular test is scheduled for tomorrow Readmission: 06/26/2020 upon evaluation today patient appears for initial evaluation in her clinic that she has been here before in 2016 and has been quite sometime. She did have a fractured hip in February on the 23rd 2022. Subsequently this had to be pinned and she ended up with a pressure injury on her heel following when she was using her foot to help move her around in the bed. Subsequently this has led to the wound that she has been dealing with since that time. She lives at home with her daughter currently she does have dementia. 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 07/03/2020 upon evaluation today patient appears to be doing well with regard to her heel ulcer. She did have  arterial studies they appear to be doing excellent it was premature normal across the board with TBI's in the 90s and ABIs well within normal range. Nonetheless there does not appear to be any signs of arterial insufficiency whatsoever. With that being said the patient does have also signs of improvement there is some necrotic tissue in the base of the wound number to try to clear some of that away today I do believe the Iodoflex/Iodosorb is doing well. 5/24; difficult punched out wound on the left medial heel. She has been using Iodoflex 07/17/2020 upon evaluation today patient appears to be doing well at this point in regard to her wound. I do feel like this is a little bit deeper but again that is what is expected as we continue with the Iodoflex I think that this is just going to get deeper until we get to the base of the wound. With that being said I think we are getting much closer to the base of the wound where we can have healthier tissue that we get a be managing here which that will be awesome. In the meantime I am not surprised by what I am seeing and in fact the wound appears to be better as compared to previous findings. 07/24/2020 upon evaluation today patient appears to be doing well with regard to her wound. Overall I am extremely pleased with where  things stand today. I do not see any signs of active infection which is great and overall I think that the patient is making good progress. There is good to be some need for sharp debridement today. 07/31/2020 upon evaluation today patient appears to be doing better in regard to her heel ulcer. She has been tolerating the dressing changes without complication. Fortunately there does not appear to be any signs of active infection which is great and overall very pleased in this regard. No fevers, chills, nausea, vomiting, or diarrhea. 08/14/2020 upon evaluation today patient appears to be doing better in regard to her heel ulcer. I am very  pleased in that regard. Unfortunately she still has a slight deep tissue injury in regard to the medial portion of her foot over the same area. Unfortunately I think this is something that if were not careful it was can open up into the wound. That is my main concern here based on what I see. Fortunately there does not appear to be any evidence of active infection which is great news and overall very pleased with where things stand at this point. 08/21/2020 upon evaluation today patient appears to be doing well with regard to her wound. She has been tolerating the dressing changes without complication. Fortunately there does not appear to be any signs of active infection at this time. No fevers, chills, nausea, vomiting, or diarrhea. I do believe the compression wrap was beneficial for her. 08/28/2020 upon evaluation today patient appears to be doing well with regard to her wounds currently. Fortunately there does not appear to be any signs of active infection at this time. No fevers, chills, nausea, vomiting, or diarrhea. With that being said I think that her leg is doing quite well to be honest. [name] [name], [name] [name] L. ([id number]) 09/04/2020 upon evaluation today patient appears to be doing well with regard to her wound on the heel. This is showing signs of good epithelial growth although there is probably can it be an indention where this heals that is okay as long as we get it closed. Fortunately I do not see any evidence of infection at this point. 09/13/2020 upon evaluation today patient appears to be doing well with regard to her wounds. She has been tolerating the dressing changes without complication. Fortunately he is actually doing extremely well and I think she is making great progress this is measuring smaller. I would recommend that such that we continue with the [facility] likely since he is doing so well. 09/18/2020 upon evaluation today patient appears to be doing well with regard to her  heel ulcer. She is making good progress and I am very pleased with what we see today. I think the Hydrofera Blue is still doing excellent. 10/02/2020 upon evaluation today patient's wound actually appears to be showing signs of good improvement in regard to the heel. I am very pleased in this regard. With that being said I do believe that the area which is somewhat been stable and dry is starting to lift up and beginning to clear this away as well that is on the foot. Subsequently I am going to have to perform some debridement here and we did actually go ahead and allow this as a wound today as before it was just more deep tissue injury and eschar covering now I think it is a little bit more than that to be honest. 10/09/2020 upon evaluation today patient appears to be doing decently well in regard to her wounds. I  am actually very pleased with where things stand today. There does not appear to be any signs of active infection which is great news. No fevers, chills, nausea, vomiting, or diarrhea. She is going require some sharp debridement today. 10/16/2020 upon evaluation today patient appears to be doing decently well in regard to her heel as well as the foot. Both are showing signs of good improvement which is great news. In general and extremely pleased with where things stand at this point. She is tolerating the [facility] well although this is getting so small in the heel I think collagen may be a better option here based on what I am seeing. With regard to the foot the Iodoflex/Iodosorb is still probably the best method here. 10/26/2020 upon evaluation today patient actually appears to be doing well in some respects today. She has been tolerating the dressing changes without complication and this is great news. The Hydrofera Blue is done well for the heel this actually appears to be healed today. With that being said in regard to the foot I think we are ready to switch to [facility] based  on what I see at this point. 10/30/2020 upon evaluation today patient appears to be doing well with regard to his wound. He has been tolerating the dressing changes without complication. Fortunately there does not appear to be any signs of active infection systemically at this time which is great news and overall very pleased with where things stand today. I do think that she is making progress although it somewhat slow still where showing signs of improvement little by little here. 11/06/2020 upon evaluation today patient appears to be doing decently well in regard to her wound. We will start and see better overall appearance of the base of the wound which is great news she still continues to have some abnormal fluorescence signals with a MolecuLight DX that will be detailed below. 11/20/2020 upon evaluation today patient's wound actually showing signs of improvement. There is good to be some need for sharp debridement today and that was discussed with the patient. I am going to go ahead and clean this up but I do believe the [facility] is doing a great job. 11/27/2020; the patient had eschar over the original heel wound which I removed with a #3 curette there was nothing open here. The area is on the lateral left foot foot. 12/04/2020 upon evaluation today patient appears to be doing well with regard to her wound. The wound bed actually appeared to be doing well after I remove the dry endoform from the wound bed. This I tracked little bit of fluid but nonetheless underneath this appears to be doing awesome. I am very pleased with where things stand today. No fevers, chills, nausea, vomiting, or diarrhea. 12/11/2020 upon evaluation today patient appears to be doing well with regard to her wound although it keeps getting dry and filled up with the endoform I am not seeing any signs of infection but we are going to have to clean this away in order to try and get things moving in an appropriate  direction. I discussed that with patient's daughter today as well. She is in agreement with proceeding as such. Objective Constitutional Well-nourished and well-hydrated in no acute distress. Vitals Time Taken: 11:35 AM, Height: 58 in, Weight: 137 lbs, BMI: 28.6, Temperature: 97.4 F, Pulse: 83 bpm, Respiratory Rate: 16 breaths/min, Blood Pressure: 109/61 mmHg. Respiratory normal breathing without difficulty. Psychiatric this patient is able to make decisions and  demonstrates good insight into disease process. Alert and Oriented x 3. pleasant and cooperative. General Notes: Upon inspection patient's wound bed did require some sharp debridement to clear away some of the dry callus and necrotic debris on the surface of the wound once this was debrided it looked good underneath but does seem to be having trouble filling in due to the fact that [place], [place] ([id number]) keeps callusing over. For that reason I think we can switch over to Xeroform gauze to try to see how this will do. Integumentary (Hair, Skin) Wound #3 status is Open. Original cause of wound was Pressure Injury. The date acquired was: 10/02/2020. The wound has been in treatment 10 weeks. The wound is located on the Left Foot. The wound measures 0.5cm length x 0.5cm width x 0.1cm depth; 0.196cm^2 area and 0.02cm^3 volume. There is Fat Layer (Subcutaneous Tissue) exposed. There is no tunneling or undermining noted. There is a none present amount of drainage noted. The wound margin is thickened. There is no granulation within the wound bed. There is a large (67-100%) amount of necrotic tissue within the wound bed including Eschar. Assessment Active Problems ICD-10 Pressure ulcer of other site, stage 3 Essential (primary) hypertension Vascular dementia without behavioral disturbance Atherosclerotic heart disease of native coronary artery without angina pectoris Procedures Wound #3 Pre-procedure diagnosis of Wound #3 is a  Pressure Ulcer located on the Left Foot . There was a Excisional Skin/Subcutaneous Tissue Debridement with a total area of 0.25 sq cm performed by Tommie Sams., PA-C. With the following instrument(s): Curette Material removed includes Eschar, Callus, Subcutaneous Tissue, and Slough after achieving pain control using Lidocaine. A time out was conducted at 11:57, prior to the start of the procedure. A Moderate amount of bleeding was controlled with Other Topical Anticoagulant. The procedure was tolerated well. Post Debridement Measurements: 0.5cm length x 0.6cm width x 0.2cm depth; 0.047cm^3 volume. Post debridement Stage noted as Category/Stage III. Character of Wound/Ulcer Post Debridement is improved. Post procedure Diagnosis Wound #3: Same as Pre-Procedure Pre-procedure diagnosis of Wound #3 is a Pressure Ulcer located on the Left Foot . There was a Three Layer Compression Therapy Procedure by Donnamarie Poag, RN. Post procedure Diagnosis Wound #3: Same as Pre-Procedure Plan Follow-up Appointments: Return Appointment in 1 week. Nurse Visit as needed Bathing/ Shower/ Hygiene: May shower with wound dressing protected with water repellent cover or cast protector. No tub bath. Edema Control - Lymphedema / Segmental Compressive Device / Other: Optional: One layer of unna paste to top of compression wrap (to act as an anchor). Patient to wear own compression stockings. Remove compression stockings every night before going to bed and put on every morning when getting up. - right leg Elevate legs to the level of the heart and pump ankles as often as possible Elevate leg(s) parallel to the floor when sitting. DO YOUR BEST to sleep in the bed at night. DO NOT sleep in your recliner. Long hours of sitting in a recliner leads to swelling of the legs and/or potential wounds on your backside. Off-Loading: Other: - Offloading boots while in bed; Try pillow under sheet to keep it in place. WOUND #3: -  Foot Wound Laterality: Left Cleanser: Soap and Water 1 x Per Week/30 Days Discharge Instructions: Gently cleanse wound with antibacterial soap, rinse and pat dry prior to dressing wounds Secondary Dressing: ABD Pad 5x9 (in/in) 1 x Per Week/30 Days Discharge Instructions: Cover with ABD pad Secondary Dressing: Xeroform Petrolatum Gauze, Overwrap 1x8 (in/in)  1 x Per Week/30 Days Compression Wrap: Profore Lite LF 3 Multilayer Compression Bandaging System 1 x Per Week/30 Days Discharge Instructions: Apply 3 multi-layer wrap as prescribed. Calvey, Tosca L. ([id number]) 1. Would recommend currently that we switch over to Xeroform gauze discontinue the endoform at this time. 2. I am also can recommend that we have the patient continue with the ABD pad to cover followed by 3 layer compression wrap this seems to be doing a great job. We will see patient back for reevaluation in 1 week here in the clinic. If anything worsens or changes patient will contact our office for additional recommendations. Electronic Signature(s) Signed: 12/11/2020 5:33:03 PM By: Worthy Keeler PA-C Entered By: Worthy Keeler on 12/11/2020 17:33:03 Suhre, Sarae L. ([id number]) -------------------------------------------------------------------------------- SuperBill Details Patient Name: Cassis, Sakira L. Date of Service: 12/11/2020 Medical Record Number: [id number] Patient Account Number: [id number] Date of Birth/Sex: 08-Aug-1926 (87 y.o. F) Treating RN: Donnamarie Poag Primary Care Provider: [name] Other Clinician: Referring Provider: [name] Treating Provider/Extender: Skipper Cliche in Treatment: 24 Diagnosis Coding ICD-10 Codes Code Description [phone] Pressure ulcer of other site, stage 3 I10 Essential (primary) hypertension F01.50 Vascular dementia without behavioral disturbance I25.10 Atherosclerotic heart disease of native coronary artery without angina pectoris Facility Procedures CPT4  Code: 32122482 Description: 50037 - DEB SUBQ TISSUE 20 SQ CM/< Modifier: Quantity: 1 CPT4 Code: Description: ICD-10 Diagnosis Description L89.893 Pressure ulcer of other site, stage 3 Modifier: Quantity: Physician Procedures CPT4 Code: [id number] Description: 11042 - WC PHYS SUBQ TISS 20 SQ CM Modifier: Quantity: 1 CPT4 Code: Description: ICD-10 Diagnosis Description L89.893 Pressure ulcer of other site, stage 3 Modifier: Quantity: Electronic Signature(s) Signed: 12/11/2020 5:33:23 PM By: Worthy Keeler PA-C Previous Signature: 12/11/2020 4:40:41 PM Version By: Donnamarie Poag Entered By: Worthy Keeler on 12/11/2020 17:33:22

## 2020-12-11 NOTE — Progress Notes (Signed)
Jernberg, Emiliya L. ([id number]) Visit Report for 12/11/2020 Arrival Information Details Patient Name: [name], [name] Date of Service: 12/11/2020 11:30 AM Medical Record Number: [id number] Patient Account Number: [id number] Date of Birth/Sex: [birth year] (87 y.o. F) Treating RN: Donnamarie Poag Primary Care Delaney Perona: [name] Other Clinician: Referring Ege Muckey: [name] Treating Caton Popowski/Extender: Skipper Cliche in Treatment: 24 Visit Information History Since Last Visit Added or deleted any medications: No Patient Arrived: Wheel Chair Had a fall or experienced change in No Arrival Time: 11:35 activities of daily living that may affect Accompanied By: daughter risk of falls: Transfer Assistance: EasyPivot Patient Lift Hospitalized since last visit: No Patient Identification Verified: Yes Has Dressing in Place as Prescribed: Yes Secondary Verification Process Completed: Yes Has Compression in Place as Prescribed: Yes Patient Requires Transmission-Based No Pain Present Now: No Precautions: Patient Has Alerts: Yes Patient Alerts: NOT diabetic ABI R1.18 L1.25 06/27/20 Electronic Signature(s) Signed: 12/11/2020 4:40:41 PM By: Donnamarie Poag Entered ByDonnamarie Poag on 12/11/2020 11:35:49 Kock, Kriya L. ([id number]) -------------------------------------------------------------------------------- Clinic Level of Care Assessment Details Patient Name: [name], [name] Date of Service: 12/11/2020 11:30 AM Medical Record Number: [id number] Patient Account Number: [id number] Date of Birth/Sex: 06-29-1926 (87 y.o. F) Treating RN: Donnamarie Poag Primary Care Lucile Didonato: [name] Other Clinician: Referring Ladesha Pacini: [name] Treating Kenneith Stief/Extender: Skipper Cliche in Treatment: 24 Clinic Level of Care Assessment Items TOOL 1 Quantity Score []  - Use when EandM and Procedure is performed on INITIAL visit 0 ASSESSMENTS - Nursing Assessment / Reassessment []  -  General Physical Exam (combine w/ comprehensive assessment (listed just below) when performed on new 0 pt. evals) []  - 0 Comprehensive Assessment (HX, ROS, Risk Assessments, Wounds Hx, etc.) ASSESSMENTS - Wound and Skin Assessment / Reassessment []  - Dermatologic / Skin Assessment (not related to wound area) 0 ASSESSMENTS - Ostomy and/or Continence Assessment and Care []  - Incontinence Assessment and Management 0 []  - 0 Ostomy Care Assessment and Management (repouching, etc.) PROCESS - Coordination of Care []  - Simple Patient / Family Education for ongoing care 0 []  - 0 Complex (extensive) Patient / Family Education for ongoing care []  - 0 Staff obtains Programmer, systems, Records, Test Results / Process Orders []  - 0 Staff telephones HHA, Nursing Homes / Clarify orders / etc []  - 0 Routine Transfer to another Facility (non-emergent condition) []  - 0 Routine Hospital Admission (non-emergent condition) []  - 0 New Admissions / Biomedical engineer / Ordering NPWT, Apligraf, etc. []  - 0 Emergency Hospital Admission (emergent condition) PROCESS - Special Needs []  - Pediatric / Minor Patient Management 0 []  - 0 Isolation Patient Management []  - 0 Hearing / Language / Visual special needs []  - 0 Assessment of Community assistance (transportation, D/C planning, etc.) []  - 0 Additional assistance / Altered mentation []  - 0 Support Surface(s) Assessment (bed, cushion, seat, etc.) INTERVENTIONS - Miscellaneous []  - External ear exam 0 []  - 0 Patient Transfer (multiple staff / Civil Service fast streamer / Similar devices) []  - 0 Simple Staple / Suture removal (25 or less) []  - 0 Complex Staple / Suture removal (26 or more) []  - 0 Hypo/Hyperglycemic Management (do not check if billed separately) []  - 0 Ankle / Brachial Index (ABI) - do not check if billed separately Has the patient been seen at the hospital within the last three years: Yes Total Score: 0 Level Of Care: ____ Ciampa, Marvette L.  ([id number]) Electronic Signature(s) Signed: 12/11/2020 4:40:41 PM By: Donnamarie Poag Entered By: Donnamarie Poag on 12/11/2020 12:01:01 Eisenhour, Kym L. (  [id number]) -------------------------------------------------------------------------------- Compression Therapy Details Patient Name: [name], [name] Date of Service: 12/11/2020 11:30 AM Medical Record Number: [id number] Patient Account Number: [id number] Date of Birth/Sex: 1926/05/27 (87 y.o. F) Treating RN: Donnamarie Poag Primary Care Sharetta Ricchio: [name] Other Clinician: Referring Kosisochukwu Goldberg: [name] Treating Breion Novacek/Extender: Skipper Cliche in Treatment: 24 Compression Therapy Performed for Wound Assessment: Wound #3 Left Foot Performed By: Clinician Donnamarie Poag, RN Compression Type: Three Layer Post Procedure Diagnosis Same as Pre-procedure Electronic Signature(s) Signed: 12/11/2020 4:40:41 PM By: Donnamarie Poag Entered By: Donnamarie Poag on 12/11/2020 11:57:23 Dhanani, Maxyne L. ([id number]) -------------------------------------------------------------------------------- Encounter Discharge Information Details Patient Name: [name], [name] Date of Service: 12/11/2020 11:30 AM Medical Record Number: [id number] Patient Account Number: [id number] Date of Birth/Sex: 1926-09-07 (87 y.o. F) Treating RN: Donnamarie Poag Primary Care Aurthur Wingerter: [name] Other Clinician: Referring Adelynn Gipe: [name] Treating Shaquilla Kehres/Extender: Skipper Cliche in Treatment: 24 Encounter Discharge Information Items Post Procedure Vitals Discharge Condition: Stable Temperature (F): 97.4 Ambulatory Status: Wheelchair Pulse (bpm): 83 Discharge Destination: Home Respiratory Rate (breaths/min): 16 Transportation: Private Auto Blood Pressure (mmHg): 109/61 Accompanied By: daughter Schedule Follow-up Appointment: Yes Clinical Summary of Care: Electronic Signature(s) Signed: 12/11/2020 4:40:41 PM By: Donnamarie Poag Entered ByDonnamarie Poag  on 12/11/2020 12:02:22 Hamon, Autym L. ([id number]) -------------------------------------------------------------------------------- Lower Extremity Assessment Details Patient Name: [name], [name] Date of Service: 12/11/2020 11:30 AM Medical Record Number: [id number] Patient Account Number: [id number] Date of Birth/Sex: January 14, 1927 (87 y.o. F) Treating RN: Donnamarie Poag Primary Care Hasnain Manheim: [name] Other Clinician: Referring Markanthony Gedney: [name] Treating Desere Gwin/Extender: Skipper Cliche in Treatment: 24 Edema Assessment Assessed: [Left: Yes] Patrice Paradise: No] [Left: Edema] [Right: :] Calf Left: Right: Point of Measurement: 32 cm From Medial Instep 32 cm Ankle Left: Right: Point of Measurement: 12 cm From Medial Instep 22 cm Vascular Assessment Pulses: Dorsalis Pedis Palpable: [Left:Yes] Electronic Signature(s) Signed: 12/11/2020 4:40:41 PM By: Donnamarie Poag Entered ByDonnamarie Poag on 12/11/2020 11:45:51 Matteucci, Kahlotus ([id number]) -------------------------------------------------------------------------------- Multi Wound Chart Details Patient Name: [name], [name] Date of Service: 12/11/2020 11:30 AM Medical Record Number: [id number] Patient Account Number: [id number] Date of Birth/Sex: 02-12-1927 (87 y.o. F) Treating RN: Donnamarie Poag Primary Care Tennille Montelongo: [name] Other Clinician: Referring Hortensia Duffin: [name] Treating Shyan Scalisi/Extender: Skipper Cliche in Treatment: 24 Vital Signs Height(in): 69 Pulse(bpm): 55 Weight(lbs): 137 Blood Pressure(mmHg): 109/61 Body Mass Index(BMI): 29 Temperature(F): 97.4 Respiratory Rate(breaths/min): 16 Photos: [N/A:N/A] Wound Location: Left Foot N/A N/A Wounding Event: Pressure Injury N/A N/A Primary Etiology: Pressure Ulcer N/A N/A Comorbid History: Cataracts, Anemia, Coronary Artery N/A N/A Disease, Hypertension, Myocardial Infarction, End Stage Renal Disease, Gout,  Osteoarthritis, Dementia Date Acquired: 10/02/2020 N/A N/A Weeks of Treatment: 10 N/A N/A Wound Status: Open N/A N/A Measurements L x W x D (cm) 0.5x0.5x0.1 N/A N/A Area (cm) : 0.196 N/A N/A Volume (cm) : 0.02 N/A N/A % Reduction in Area: 37.60% N/A N/A % Reduction in Volume: 35.50% N/A N/A Classification: Category/Stage III N/A N/A Exudate Amount: None Present N/A N/A Wound Margin: Thickened N/A N/A Granulation Amount: None Present (0%) N/A N/A Necrotic Amount: Large (67-100%) N/A N/A Necrotic Tissue: Eschar N/A N/A Exposed Structures: Fat Layer (Subcutaneous Tissue): N/A N/A Yes Fascia: No Tendon: No Muscle: No Joint: No Bone: No Treatment Notes Electronic Signature(s) Signed: 12/11/2020 4:40:41 PM By: Donnamarie Poag Entered ByDonnamarie Poag on 12/11/2020 11:56:39 Chrestman, Tensed ([id number]) -------------------------------------------------------------------------------- [place] Details Patient Name: [name], [name] Date of Service: 12/11/2020 11:30 AM Medical Record Number: [id number] Patient Account  Number: [id number] Date of Birth/Sex: [birth year] (87 y.o. F) Treating RN: Donnamarie Poag Primary Care Ceira Hoeschen: [name] Other Clinician: Referring Javis Abboud: [name] Treating Elissa Grieshop/Extender: Skipper Cliche in Treatment: 24 Active Inactive Electronic Signature(s) Signed: 12/11/2020 4:40:41 PM By: Donnamarie Poag Entered ByDonnamarie Poag on 12/11/2020 11:46:12 [name], [name] ([id number]) -------------------------------------------------------------------------------- Pain Assessment Details Patient Name: [name], [name] Date of Service: 12/11/2020 11:30 AM Medical Record Number: [id number] Patient Account Number: [id number] Date of Birth/Sex: Aug 23, 1926 (87 y.o. F) Treating RN: Donnamarie Poag Primary Care Latreece Mochizuki: [name] Other Clinician: Referring Ira Dougher: [name] Treating Delmont Prosch/Extender: Skipper Cliche in  Treatment: 24 Active Problems Location of Pain Severity and Description of Pain Patient Has Paino No Site Locations Rate the pain. Current Pain Level: 0 Pain Management and Medication Current Pain Management: Electronic Signature(s) Signed: 12/11/2020 4:40:41 PM By: Donnamarie Poag Entered By: Donnamarie Poag on 12/11/2020 11:43:30 Alpern, [place] ([id number]) -------------------------------------------------------------------------------- Patient/Caregiver Education Details Patient Name: [name], [name] Date of Service: 12/11/2020 11:30 AM Medical Record Number: [id number] Patient Account Number: [id number] Date of Birth/Gender: 1926-10-28 (87 y.o. F) Treating RN: Donnamarie Poag Primary Care Physician: [name] Other Clinician: Referring Physician: [name] Treating Physician/Extender: Skipper Cliche in Treatment: 24 Education Assessment Education Provided To: Patient and Caregiver Education Topics Provided Basic Hygiene: Wound Debridement: Wound/Skin Impairment: Electronic Signature(s) Signed: 12/11/2020 4:40:41 PM By: Donnamarie Poag Entered ByDonnamarie Poag on 12/11/2020 11:57:45 Troupe, Nakiah L. ([id number]) -------------------------------------------------------------------------------- Wound Assessment Details Patient Name: [name], [name] Date of Service: 12/11/2020 11:30 AM Medical Record Number: [id number] Patient Account Number: [id number] Date of Birth/Sex: [birth year] (87 y.o. F) Treating RN: Donnamarie Poag Primary Care Hattye Siegfried: [name] Other Clinician: Referring Zaniya Mcaulay: [name] Treating Saryna Kneeland/Extender: Skipper Cliche in Treatment: 24 Wound Status Wound Number: 3 Primary Pressure Ulcer Etiology: Wound Location: Left Foot Wound Open Wounding Event: Pressure Injury Status: Date Acquired: 10/02/2020 Comorbid Cataracts, Anemia, Coronary Artery Disease, Weeks Of Treatment: 10 History: Hypertension, Myocardial Infarction, End Stage  Renal Clustered Wound: No Disease, Gout, Osteoarthritis, Dementia Photos Wound Measurements Length: (cm) 0.5 Width: (cm) 0.5 Depth: (cm) 0.1 Area: (cm) 0.196 Volume: (cm) 0.02 % Reduction in Area: 37.6% % Reduction in Volume: 35.5% Tunneling: No Undermining: No Wound Description Classification: Category/Stage III Wound Margin: Thickened Exudate Amount: None Present Foul Odor After Cleansing: No Slough/Fibrino Yes Wound Bed Granulation Amount: None Present (0%) Exposed Structure Necrotic Amount: Large (67-100%) Fascia Exposed: No Necrotic Quality: Eschar Fat Layer (Subcutaneous Tissue) Exposed: Yes Tendon Exposed: No Muscle Exposed: No Joint Exposed: No Bone Exposed: No Treatment Notes Wound #3 (Foot) Wound Laterality: Left Cleanser Soap and Water Discharge Instruction: Gently cleanse wound with antibacterial soap, rinse and pat dry prior to dressing wounds Peri-Wound Care Topical [name], [name] ([id number]) Primary Dressing Secondary Dressing ABD Pad 5x9 (in/in) Discharge Instruction: Cover with ABD pad Xeroform Petrolatum Gauze, Overwrap 1x8 (in/in) Secured With Compression Wrap Profore Lite LF 3 Multilayer Compression Bandaging System Discharge Instruction: Apply 3 multi-layer wrap as prescribed. Compression Stockings Add-Ons Electronic Signature(s) Signed: 12/11/2020 4:40:41 PM By: Donnamarie Poag Entered By: Donnamarie Poag on 12/11/2020 12:02:51 [name], [name] ([id number]) -------------------------------------------------------------------------------- Vitals Details Patient Name: Heiden, Khyli L. Date of Service: 12/11/2020 11:30 AM Medical Record Number: [id number] Patient Account Number: [id number] Date of Birth/Sex: [birth year] (87 y.o. F) Treating RN: Donnamarie Poag Primary Care Alona Danford: [name] Other Clinician: Referring Sebastiana Wuest: [name] Treating Brevon Dewald/Extender: Skipper Cliche in Treatment: 24 Vital Signs Time Taken:  11:35 Temperature (F): 97.4 Height (in): 58 Pulse (  bpm): 83 Weight (lbs): 137 Respiratory Rate (breaths/min): 16 Body Mass Index (BMI): 28.6 Blood Pressure (mmHg): 109/61 Reference Range: 80 - 120 mg / dl Electronic Signature(s) Signed: 12/11/2020 4:40:41 PM By: Donnamarie Poag Entered ByDonnamarie Poag on 12/11/2020 11:43:15

## 2020-12-18 ENCOUNTER — Other Ambulatory Visit: Payer: Self-pay

## 2020-12-18 ENCOUNTER — Ambulatory Visit: Payer: PPO | Admitting: Podiatry

## 2020-12-18 DIAGNOSIS — M79676 Pain in unspecified toe(s): Secondary | ICD-10-CM

## 2020-12-18 DIAGNOSIS — B351 Tinea unguium: Secondary | ICD-10-CM | POA: Diagnosis not present

## 2020-12-18 NOTE — Progress Notes (Signed)
   SUBJECTIVE Patient presents to office today complaining of elongated, thickened nails that cause pain while ambulating in shoes.  Patient is unable to trim their own nails.  Dressings left intact to the left lower extremity.  Patient is here for further evaluation and treatment.  Past Medical History:  Diagnosis Date   Hyperlipidemia    Hypertension    Myocardial infarction [facility])     OBJECTIVE General Patient is awake, alert, and oriented x 3 and in no acute distress. Derm Skin is dry and supple bilateral. Negative open lesions or macerations. Remaining integument unremarkable. Nails are tender, long, thickened and dystrophic with subungual debris, consistent with onychomycosis, 1-5 bilateral. No signs of infection noted. Vasc capillary refill immediate.  Temperature gradient within normal limits.  Neuro Epicritic and protective threshold sensation grossly intact bilaterally.  Musculoskeletal Exam No symptomatic pedal deformities noted bilateral. Muscular strength within normal limits.  ASSESSMENT 1.  Pain due to onychomycosis of toenails both  PLAN OF CARE 1. Patient evaluated today.  2. Instructed to maintain good pedal hygiene and foot care.  3. Mechanical debridement of nails 1-5 bilaterally performed using a nail nipper. Filed with dremel without incident.  4.  Continue management at the [facility] wound care center  5.  Return to clinic in 3 mos.    Edrick Kins, DPM [facility]  Dr. Edrick Kins, DPM    [address], [place] [address]                Office [phone]  Fax [phone]

## 2020-12-21 ENCOUNTER — Other Ambulatory Visit: Payer: Self-pay

## 2020-12-21 ENCOUNTER — Encounter: Payer: PPO | Attending: Physician Assistant | Admitting: Physician Assistant

## 2020-12-21 DIAGNOSIS — E669 Obesity, unspecified: Secondary | ICD-10-CM | POA: Insufficient documentation

## 2020-12-21 DIAGNOSIS — N183 Chronic kidney disease, stage 3 unspecified: Secondary | ICD-10-CM | POA: Insufficient documentation

## 2020-12-21 DIAGNOSIS — L84 Corns and callosities: Secondary | ICD-10-CM | POA: Insufficient documentation

## 2020-12-21 DIAGNOSIS — I129 Hypertensive chronic kidney disease with stage 1 through stage 4 chronic kidney disease, or unspecified chronic kidney disease: Secondary | ICD-10-CM | POA: Diagnosis not present

## 2020-12-21 DIAGNOSIS — Z6828 Body mass index (BMI) 28.0-28.9, adult: Secondary | ICD-10-CM | POA: Insufficient documentation

## 2020-12-21 DIAGNOSIS — F0150 Vascular dementia without behavioral disturbance: Secondary | ICD-10-CM | POA: Diagnosis not present

## 2020-12-21 DIAGNOSIS — I2510 Atherosclerotic heart disease of native coronary artery without angina pectoris: Secondary | ICD-10-CM | POA: Diagnosis not present

## 2020-12-21 DIAGNOSIS — L89893 Pressure ulcer of other site, stage 3: Secondary | ICD-10-CM | POA: Diagnosis not present

## 2020-12-21 NOTE — Progress Notes (Signed)
Vasko, Heily L. ([id number]) Visit Report for 12/21/2020 Arrival Information Details Patient Name: Wish, SKARLET LYONS. Date of Service: 12/21/2020 11:30 AM Medical Record Number: [id number] Patient Account Number: [id number] Date of Birth/Sex: 1926-11-26 (87 y.o. F) Treating RN: Cornell Barman Primary Care Izaiah Tabb: [name] Other Clinician: Referring Chessa Barrasso: [name] Treating Lionel Woodberry/Extender: Skipper Cliche in Treatment: 25 Visit Information History Since Last Visit Added or deleted any medications: No Patient Arrived: Wheel Chair Has Dressing in Place as Prescribed: Yes Arrival Time: 11:40 Has Compression in Place as Prescribed: Yes Transfer Assistance: Manual Pain Present Now: No Patient Identification Verified: Yes Secondary Verification Process Completed: Yes Patient Requires Transmission-Based No Precautions: Patient Has Alerts: Yes Patient Alerts: NOT diabetic ABI R1.18 L1.25 06/27/20 Electronic Signature(s) Signed: 12/21/2020 12:18:06 PM By: Gretta Cool, BSN, RN, CWS, Kim RN, BSN Entered By: Gretta Cool, BSN, RN, CWS, Kim on 12/21/2020 11:42:31 Haggart, Khristie L. ([id number]) -------------------------------------------------------------------------------- Encounter Discharge Information Details Patient Name: Scibilia, Euline L. Date of Service: 12/21/2020 11:30 AM Medical Record Number: [id number] Patient Account Number: [id number] Date of Birth/Sex: January 25, 1927 (87 y.o. F) Treating RN: Cornell Barman Primary Care Robbye Dede: [name] Other Clinician: Referring Azim Gillingham: [name] Treating Angelicia Lessner/Extender: Skipper Cliche in Treatment: 25 Encounter Discharge Information Items Post Procedure Vitals Discharge Condition: Stable Temperature (F): 98.3 Ambulatory Status: Wheelchair Pulse (bpm): 81 Discharge Destination: Home Respiratory Rate (breaths/min): 16 Transportation: Private Auto Blood Pressure (mmHg): 143/81 Accompanied By: daughter Schedule Follow-up  Appointment: Yes Clinical Summary of Care: Electronic Signature(s) Signed: 12/21/2020 12:18:06 PM By: Gretta Cool, BSN, RN, CWS, Kim RN, BSN Entered By: Gretta Cool, BSN, RN, CWS, Kim on 12/21/2020 12:16:56 Curtice, Illeana L. ([id number]) -------------------------------------------------------------------------------- Lower Extremity Assessment Details Patient Name: Hornig, Alyiah L. Date of Service: 12/21/2020 11:30 AM Medical Record Number: [id number] Patient Account Number: [id number] Date of Birth/Sex: 1926/03/16 (87 y.o. F) Treating RN: Cornell Barman Primary Care Chiyoko Torrico: [name] Other Clinician: Referring Taqwa Deem: [name] Treating Sola Margolis/Extender: Skipper Cliche in Treatment: 25 Edema Assessment Assessed: [Left: No] Patrice Paradise: No] [Left: Edema] [Right: :] Calf Left: Right: Point of Measurement: 32 cm From Medial Instep 33 cm Ankle Left: Right: Point of Measurement: 12 cm From Medial Instep 21.5 cm Vascular Assessment Pulses: Dorsalis Pedis Palpable: [Left:Yes] Electronic Signature(s) Signed: 12/21/2020 12:18:06 PM By: Gretta Cool, BSN, RN, CWS, Kim RN, BSN Entered By: Gretta Cool, BSN, RN, CWS, Kim on 12/21/2020 11:53:23 Mcnabb, Kayson L. ([id number]) -------------------------------------------------------------------------------- Multi Wound Chart Details Patient Name: Corvera, Reeya L. Date of Service: 12/21/2020 11:30 AM Medical Record Number: [id number] Patient Account Number: [id number] Date of Birth/Sex: [birth year] (87 y.o. F) Treating RN: Cornell Barman Primary Care Eliyah Bazzi: [name] Other Clinician: Referring Lebert Lovern: [name] Treating Marvelle Caudill/Extender: Skipper Cliche in Treatment: 25 Vital Signs Height(in): 58 Pulse(bpm): 59 Weight(lbs): 137 Blood Pressure(mmHg): 143/81 Body Mass Index(BMI): 29 Temperature(F): 98.3 Respiratory Rate(breaths/min): 16 Photos: [N/A:N/A] Wound Location: Left Foot N/A N/A Wounding Event: Pressure Injury N/A N/A Primary  Etiology: Pressure Ulcer N/A N/A Comorbid History: Cataracts, Anemia, Coronary Artery N/A N/A Disease, Hypertension, Myocardial Infarction, End Stage Renal Disease, Gout, Osteoarthritis, Dementia Date Acquired: 10/02/2020 N/A N/A Weeks of Treatment: 11 N/A N/A Wound Status: Open N/A N/A Measurements L x W x D (cm) 0.2x0.3x0.4 N/A N/A Area (cm) : 0.047 N/A N/A Volume (cm) : 0.019 N/A N/A % Reduction in Area: 85.00% N/A N/A % Reduction in Volume: 38.70% N/A N/A Starting Position 1 (o'clock): 12 Ending Position 1 (o'clock): 3 Maximum Distance 1 (cm): 0.4 Undermining: Yes N/A N/A Classification: Category/Stage III N/A N/A Exudate Amount: Medium  N/A N/A Exudate Type: Serous N/A N/A Exudate Color: amber N/A N/A Wound Margin: Thickened N/A N/A Granulation Amount: Medium (34-66%) N/A N/A Necrotic Amount: Medium (34-66%) N/A N/A Necrotic Tissue: Eschar N/A N/A Exposed Structures: Fat Layer (Subcutaneous Tissue): N/A N/A Yes Fascia: No Tendon: No Muscle: No Joint: No Bone: No Epithelialization: None N/A N/A Treatment Notes Electronic Signature(s) Polansky, Mileidy L. ([id number]) Signed: 12/21/2020 12:18:06 PM By: Gretta Cool, BSN, RN, CWS, Kim RN, BSN Entered By: Gretta Cool, BSN, RN, CWS, Kim on 12/21/2020 11:57:11 Convery, Milliani L. ([id number]) -------------------------------------------------------------------------------- [place] Details Patient Name: Deane, Tylia L. Date of Service: 12/21/2020 11:30 AM Medical Record Number: [id number] Patient Account Number: [id number] Date of Birth/Sex: [birth year] (87 y.o. F) Treating RN: Cornell Barman Primary Care Alaysia Lightle: [name] Other Clinician: Referring Bishop Vanderwerf: [name] Treating Cuahutemoc Attar/Extender: Skipper Cliche in Treatment: 25 Active Inactive Necrotic Tissue Nursing Diagnoses: Impaired tissue integrity related to necrotic/devitalized tissue Knowledge deficit related to management of  necrotic/devitalized tissue Goals: Necrotic/devitalized tissue will be minimized in the wound bed Date Initiated: 12/21/2020 Target Resolution Date: 12/21/2020 Goal Status: Active Patient/caregiver will verbalize understanding of reason and process for debridement of necrotic tissue Date Initiated: 12/21/2020 Target Resolution Date: 12/21/2020 Goal Status: Active Interventions: Assess patient pain level pre-, during and post procedure and prior to discharge Provide education on necrotic tissue and debridement process Treatment Activities: Excisional debridement : 12/21/2020 Notes: Electronic Signature(s) Signed: 12/21/2020 12:18:06 PM By: Gretta Cool, BSN, RN, CWS, Kim RN, BSN Entered By: Gretta Cool, BSN, RN, CWS, Kim on 12/21/2020 11:56:21 Dasher, Divina L. ([id number]) -------------------------------------------------------------------------------- Pain Assessment Details Patient Name: Ogawa, Shuntae L. Date of Service: 12/21/2020 11:30 AM Medical Record Number: [id number] Patient Account Number: [id number] Date of Birth/Sex: 1926-06-07 (87 y.o. F) Treating RN: Cornell Barman Primary Care Raniya Golembeski: [name] Other Clinician: Referring Zaniah Titterington: [name] Treating Ily Denno/Extender: Skipper Cliche in Treatment: 25 Active Problems Location of Pain Severity and Description of Pain Patient Has Paino No Site Locations Pain Management and Medication Current Pain Management: Notes Patient denies pain at this time. Electronic Signature(s) Signed: 12/21/2020 12:18:06 PM By: Gretta Cool, BSN, RN, CWS, Kim RN, BSN Entered By: Gretta Cool, BSN, RN, CWS, Kim on 12/21/2020 11:43:17 Cassels, [place] ([id number]) -------------------------------------------------------------------------------- Patient/Caregiver Education Details Patient Name: Lute, Empress L. Date of Service: 12/21/2020 11:30 AM Medical Record Number: [id number] Patient Account Number: [id number] Date of Birth/Gender: [birth year] (87 y.o.  F) Treating RN: Cornell Barman Primary Care Physician: [name] Other Clinician: Referring Physician: [name] Treating Physician/Extender: Skipper Cliche in Treatment: 25 Education Assessment Education Provided To: Patient Education Topics Provided Wound Debridement: Handouts: Wound Debridement Methods: Demonstration, Explain/Verbal Responses: State content correctly Electronic Signature(s) Signed: 12/21/2020 12:18:06 PM By: Gretta Cool, BSN, RN, CWS, Kim RN, BSN Entered By: Gretta Cool, BSN, RN, CWS, Kim on 12/21/2020 12:15:14 Schexnider, Nadra L. ([id number]) -------------------------------------------------------------------------------- Wound Assessment Details Patient Name: Totaro, Circe L. Date of Service: 12/21/2020 11:30 AM Medical Record Number: [id number] Patient Account Number: [id number] Date of Birth/Sex: 27-Oct-1926 (87 y.o. F) Treating RN: Cornell Barman Primary Care Kemarion Abbey: [name] Other Clinician: Referring Eldar Robitaille: [name] Treating Leylany Nored/Extender: Skipper Cliche in Treatment: 25 Wound Status Wound Number: 3 Primary Pressure Ulcer Etiology: Wound Location: Left Foot Wound Open Wounding Event: Pressure Injury Status: Date Acquired: 10/02/2020 Comorbid Cataracts, Anemia, Coronary Artery Disease, Weeks Of Treatment: 11 History: Hypertension, Myocardial Infarction, End Stage Renal Clustered Wound: No Disease, Gout, Osteoarthritis, Dementia Photos Wound Measurements Length: (cm) 0.2 % Redu Width: (cm) 0.3 % Redu Depth: (cm) 0.4 Epithe Area: (cm)  0.047 [place] Volume: (cm) 0.019 Under Sta End Max ction in Area: 85% ction in Volume: 38.7% lialization: None ling: No mining: Yes rting Position (o'clock): 12 ing Position (o'clock): 3 imum Distance: (cm) 0.4 Wound Description Classification: Category/Stage III Foul O Wound Margin: Thickened Slough Exudate Amount: Medium Exudate Type: Serous Exudate Color: amber dor After Cleansing:  No /Fibrino Yes Wound Bed Granulation Amount: Medium (34-66%) Exposed Structure Necrotic Amount: Medium (34-66%) Fascia Exposed: No Necrotic Quality: Eschar Fat Layer (Subcutaneous Tissue) Exposed: Yes Tendon Exposed: No Muscle Exposed: No Joint Exposed: No Bone Exposed: No Treatment Notes Wound #3 (Foot) Wound Laterality: Left Cleanser Florer, Sylina L. ([id number]) Soap and Water Discharge Instruction: Gently cleanse wound with antibacterial soap, rinse and pat dry prior to dressing wounds Peri-Wound Care Topical Primary Dressing Hydrofera Blue Ready Transfer Foam, 2.5x2.5 (in/in) Discharge Instruction: Apply Hydrofera Blue Ready to wound bed as directed Secondary Dressing Gauze Discharge Instruction: As directed: dry, moistened with saline or moistened with Dakins Solution Secured With Compression Wrap Profore Lite LF 3 Multilayer Compression [place] Discharge Instruction: Apply 3 multi-layer wrap as prescribed. Compression Stockings Environmental education officer) Signed: 12/21/2020 12:18:06 PM By: Gretta Cool, BSN, RN, CWS, Kim RN, BSN Entered By: Gretta Cool, BSN, RN, CWS, Kim on 12/21/2020 11:51:14 Fluty, Kenzly L. ([id number]) -------------------------------------------------------------------------------- Vitals Details Patient Name: Wisor, Evangelynn L. Date of Service: 12/21/2020 11:30 AM Medical Record Number: [id number] Patient Account Number: [id number] Date of Birth/Sex: 1926-09-28 (87 y.o. F) Treating RN: Cornell Barman Primary Care Sayan Aldava: [name] Other Clinician: Referring Akayla Brass: [name] Treating Naphtali Zywicki/Extender: Skipper Cliche in Treatment: 25 Vital Signs Time Taken: 11:42 Temperature (F): 98.3 Height (in): 58 Pulse (bpm): 81 Weight (lbs): 137 Respiratory Rate (breaths/min): 16 Body Mass Index (BMI): 28.6 Blood Pressure (mmHg): 143/81 Reference Range: 80 - 120 mg / dl Electronic Signature(s) Signed: 12/21/2020 12:18:06 PM By:  Gretta Cool, BSN, RN, CWS, Kim RN, BSN Entered By: Gretta Cool, BSN, RN, CWS, Kim on 12/21/2020 11:42:53

## 2020-12-21 NOTE — Progress Notes (Addendum)
Murcia, Vaneta L. ([id number]) Visit Report for 12/21/2020 Chief Complaint Document Details Patient Name: [name] [name], [name] Date of Service: 12/21/2020 11:30 AM Medical Record Number: [id number] Patient Account Number: [id number] Date of Birth/Sex: [birth year] (87 y.o. F) Treating RN: Cornell Barman Primary Care Provider: [name] Other Clinician: Referring Provider: [name] Treating Provider/Extender: Skipper Cliche in Treatment: 25 Information Obtained from: Patient Chief Complaint Left Heel Pressure Ulcer Electronic Signature(s) Signed: 12/21/2020 11:48:47 AM By: Worthy Keeler PA-C Entered By: Worthy Keeler on 12/21/2020 11:48:47 [name] [name], [name] ([id number]) -------------------------------------------------------------------------------- Debridement Details Patient Name: [name], [name] Date of Service: 12/21/2020 11:30 AM Medical Record Number: [id number] Patient Account Number: [id number] Date of Birth/Sex: [birth year] (87 y.o. F) Treating RN: Cornell Barman Primary Care Provider: [name] Other Clinician: Referring Provider: [name] Treating Provider/Extender: Skipper Cliche in Treatment: 25 Debridement Performed for Wound #3 Left Foot Assessment: Performed By: Physician Tommie Sams., PA-C Debridement Type: Debridement Level of Consciousness (Pre- Awake and Alert procedure): Pre-procedure Verification/Time Out Yes - 12:04 Taken: Total Area Debrided (L x W): 0.5 (cm) x 0.5 (cm) = 0.25 (cm) Tissue and other material Viable, Non-Viable, Callus, Slough, Subcutaneous, Slough debrided: Level: Skin/Subcutaneous Tissue Debridement Description: Excisional Instrument: Curette Bleeding: Moderate Hemostasis Achieved: Pressure Response to Treatment: Procedure was tolerated well Level of Consciousness (Post- Awake and Alert procedure): Post Debridement Measurements of Total Wound Length: (cm) 0.5 Stage: Category/Stage III Width: (cm)  0.5 Depth: (cm) 0.5 Volume: (cm) 0.098 Character of Wound/Ulcer Post Debridement: Stable Post Procedure Diagnosis Same as Pre-procedure Electronic Signature(s) Signed: 12/21/2020 12:18:06 PM By: Gretta Cool, BSN, RN, CWS, Kim RN, BSN Signed: 12/21/2020 4:56:27 PM By: Worthy Keeler PA-C Entered By: Gretta Cool, BSN, RN, CWS, Kim on 12/21/2020 12:05:51 [name] [name], [name] [name] ([id number]) -------------------------------------------------------------------------------- HPI Details Patient Name: [name] [name], [name] Date of Service: 12/21/2020 11:30 AM Medical Record Number: [id number] Patient Account Number: [id number] Date of Birth/Sex: Apr 25, 1926 (87 y.o. F) Treating RN: Cornell Barman Primary Care Provider: [name] Other Clinician: Referring Provider: [name] Treating Provider/Extender: Skipper Cliche in Treatment: 25 History of Present Illness HPI Description: 87 year old patient was recently seen by the PCPs office for significant pain right great toe which has been going on since July. She was initially treated with Keflex which she did not complete and after the office visit this time she has been put on doxycycline. at the time of her visit she was found to have a ulcer on the plantar surface of the right great toe and also had a pyogenic granuloma over this area. X-ray of the right foot done 12/29/2014 -- IMPRESSION:Soft-tissue swelling and ulceration right great toe. No underlying bony lytic lesion identified. If osteomyelitis remains of clinical concern MRI can be obtained. Past medical history significant for anemia, chronic kidney disease stage III, obesity, varicose veins, coronary artery disease, gout, history of nicotine addiction given up smoking in 2002, hypertension, status post cardiac catheterization, status post abdominal hysterectomy, cholecystectomy and tonsillectomy. hemoglobin A1c done in August was 5.8 01/22/2015 -- at this stage the [facility] Walking boat was going to cost  them significant amount of money and they want to defer using that at the present time. 01/29/2015 -- she had a podiatry appointment and they have trimmed her toenails. She has not heard back from the vascular office regarding her venous duplex study and I have asked them to call personally so that they can get the appointment soon. 02/06/2015 -- they have made contact with the vascular office and from  what I understand that test has been done but the report is pending. 02/19/2014 -- the vascular test is scheduled for tomorrow Readmission: 06/26/2020 upon evaluation today patient appears for initial evaluation in her clinic that she has been here before in 2016 and has been quite sometime. She did have a fractured hip in February on the 23rd 2022. Subsequently this had to be pinned and she ended up with a pressure injury on her heel following when she was using her foot to help move her around in the bed. Subsequently this has led to the wound that she has been dealing with since that time. She lives at home with her daughter currently she does have dementia. 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 07/03/2020 upon evaluation today patient appears to be doing well with regard to her heel ulcer. She did have arterial studies they appear to be doing excellent it was premature normal across the board with TBI's in the 90s and ABIs well within normal range. Nonetheless there does not appear to be any signs of arterial insufficiency whatsoever. With that being said the patient does have also signs of improvement there is some necrotic tissue in the base of the wound number to try to clear some of that away today I do believe the Iodoflex/Iodosorb is doing well. 5/24; difficult punched out wound on the left medial heel. She has been using Iodoflex 07/17/2020 upon evaluation  today patient appears to be doing well at this point in regard to her wound. I do feel like this is a little bit deeper but again that is what is expected as we continue with the Iodoflex I think that this is just going to get deeper until we get to the base of the wound. With that being said I think we are getting much closer to the base of the wound where we can have healthier tissue that we get a be managing here which that will be awesome. In the meantime I am not surprised by what I am seeing and in fact the wound appears to be better as compared to previous findings. 07/24/2020 upon evaluation today patient appears to be doing well with regard to her wound. Overall I am extremely pleased with where things stand today. I do not see any signs of active infection which is great and overall I think that the patient is making good progress. There is good to be some need for sharp debridement today. 07/31/2020 upon evaluation today patient appears to be doing better in regard to her heel ulcer. She has been tolerating the dressing changes without complication. Fortunately there does not appear to be any signs of active infection which is great and overall very pleased in this regard. No fevers, chills, nausea, vomiting, or diarrhea. 08/14/2020 upon evaluation today patient appears to be doing better in regard to her heel ulcer. I am very pleased in that regard. Unfortunately she still has a slight deep tissue injury in regard to the medial portion of her foot over the same area. Unfortunately I think this is something that if were not careful it was can open up into the wound. That is my main concern here based on what I see. Fortunately there does not appear to be any evidence of active infection which is great news and overall very pleased with where things stand at this point. 08/21/2020 upon evaluation  today patient appears to be doing well with regard to her wound. She has been tolerating the dressing  changes without complication. Fortunately there does not appear to be any signs of active infection at this time. No fevers, chills, nausea, vomiting, or diarrhea. I do believe the compression wrap was beneficial for her. 08/28/2020 upon evaluation today patient appears to be doing well with regard to her wounds currently. Fortunately there does not appear to be any signs of active infection at this time. No fevers, chills, nausea, vomiting, or diarrhea. With that being said I think that her leg is doing quite well to be honest. 09/04/2020 upon evaluation today patient appears to be doing well with regard to her wound on the heel. This is showing signs of good epithelial growth although there is probably can it be an indention where this heals that is okay as long as we get it closed. Fortunately I do not see any evidence of infection at this point. 09/13/2020 upon evaluation today patient appears to be doing well with regard to her wounds. She has been tolerating the dressing changes [name] [name], [name] ([id number]) without complication. Fortunately he is actually doing extremely well and I think she is making great progress this is measuring smaller. I would recommend that such that we continue with the [facility] since he is doing so well. 09/18/2020 upon evaluation today patient appears to be doing well with regard to her heel ulcer. She is making good progress and I am very pleased with what we see today. I think the Hydrofera Blue is still doing excellent. 10/02/2020 upon evaluation today patient's wound actually appears to be showing signs of good improvement in regard to the heel. I am very pleased in this regard. With that being said I do believe that the area which is somewhat been stable and dry is starting to lift up and beginning to clear this away as well that is on the foot. Subsequently I am going to have to perform some debridement here and we did actually go ahead and allow  this as a wound today as before it was just more deep tissue injury and eschar covering now I think it is a little bit more than that to be honest. 10/09/2020 upon evaluation today patient appears to be doing decently well in regard to her wounds. I am actually very pleased with where things stand today. There does not appear to be any signs of active infection which is great news. No fevers, chills, nausea, vomiting, or diarrhea. She is going require some sharp debridement today. 10/16/2020 upon evaluation today patient appears to be doing decently well in regard to her heel as well as the foot. Both are showing signs of good improvement which is great news. In general and extremely pleased with where things stand at this point. She is tolerating the [facility] well although this is getting so small in the heel I think collagen may be a better option here based on what I am seeing. With regard to the foot the Iodoflex/Iodosorb is still probably the best method here. 10/26/2020 upon evaluation today patient actually appears to be doing well in some respects today. She has been tolerating the dressing changes without complication and this is great news. The Hydrofera Blue is done well for the heel this actually appears to be healed today. With that being said in regard to the foot I think we are ready to switch to [facility] based on what I  see at this point. 10/30/2020 upon evaluation today patient appears to be doing well with regard to his wound. He has been tolerating the dressing changes without complication. Fortunately there does not appear to be any signs of active infection systemically at this time which is great news and overall very pleased with where things stand today. I do think that she is making progress although it somewhat slow still where showing signs of improvement little by little here. 11/06/2020 upon evaluation today patient appears to be doing decently well in regard to her  wound. We will start and see better overall appearance of the base of the wound which is great news she still continues to have some abnormal fluorescence signals with a MolecuLight DX that will be detailed below. 11/20/2020 upon evaluation today patient's wound actually showing signs of improvement. There is good to be some need for sharp debridement today and that was discussed with the patient. I am going to go ahead and clean this up but I do believe the [facility] is doing a great job. 11/27/2020; the patient had eschar over the original heel wound which I removed with a #3 curette there was nothing open here. The area is on the lateral left foot foot. 12/04/2020 upon evaluation today patient appears to be doing well with regard to her wound. The wound bed actually appeared to be doing well after I remove the dry endoform from the wound bed. This I tracked little bit of fluid but nonetheless underneath this appears to be doing awesome. I am very pleased with where things stand today. No fevers, chills, nausea, vomiting, or diarrhea. 12/11/2020 upon evaluation today patient appears to be doing well with regard to her wound although it keeps getting dry and filled up with the endoform I am not seeing any signs of infection but we are going to have to clean this away in order to try and get things moving in an appropriate direction. I discussed that with patient's daughter today as well. She is in agreement with proceeding as such. 12/21/2020 upon evaluation today patient appears to be doing well with regard to her wound this did not seal off like it was with the collagen but nonetheless also did not really feeling quite as much as I would like to have seen. I do believe that we may need to go ahead and see about doing a debridement today and I really feel like we may want to switch back to the [facility] which I felt like was doing better in the past. Good news is the base of the wound  appears healthy with good granulation tissue. Electronic Signature(s) Signed: 12/21/2020 2:26:55 PM By: Worthy Keeler PA-C Entered By: Worthy Keeler on 12/21/2020 14:26:55 [name] [name], [name] ([id number]) -------------------------------------------------------------------------------- Physical Exam Details Patient Name: [name] [name], [name] Date of Service: 12/21/2020 11:30 AM Medical Record Number: [id number] Patient Account Number: [id number] Date of Birth/Sex: December 28, 1926 (87 y.o. F) Treating RN: Cornell Barman Primary Care Provider: [name] Other Clinician: Referring Provider: [name] Treating Provider/Extender: Skipper Cliche in Treatment: 67 Constitutional Well-nourished and well-hydrated in no acute distress. Respiratory normal breathing without difficulty. Psychiatric this patient is able to make decisions and demonstrates good insight into disease process. Alert and Oriented x 3. pleasant and cooperative. Notes Fortunately there does not appear to be any signs of active infection at this time which is great news. Upon inspection patient's wound bed actually showed signs of good granulation epithelization at this point.  I did perform debridement there was some of the necrotic debris and the patient tolerated that debridement today without complication. Electronic Signature(s) Signed: 12/21/2020 2:27:25 PM By: Worthy Keeler PA-C Entered By: Worthy Keeler on 12/21/2020 14:27:25 [name] [name], [name] ([id number]) -------------------------------------------------------------------------------- Physician Orders Details Patient Name: [name] [name], [name] Date of Service: 12/21/2020 11:30 AM Medical Record Number: [id number] Patient Account Number: [id number] Date of Birth/Sex: 1926-04-30 (87 y.o. F) Treating RN: Cornell Barman Primary Care Provider: [name] Other Clinician: Referring Provider: [name] Treating Provider/Extender: Skipper Cliche in Treatment:  25 Verbal / Phone Orders: No Diagnosis Coding ICD-10 Coding Code Description [phone] Pressure ulcer of other site, stage 3 I10 Essential (primary) hypertension F01.50 Vascular dementia without behavioral disturbance I25.10 Atherosclerotic heart disease of native coronary artery without angina pectoris Follow-up Appointments o Return Appointment in 1 week. o Nurse Visit as needed Bathing/ Shower/ Hygiene o May shower with wound dressing protected with water repellent cover or cast protector. o No tub bath. Edema Control - Lymphedema / Segmental Compressive Device / Other o Optional: One layer of unna paste to top of compression wrap (to act as an anchor). o Patient to wear own compression stockings. Remove compression stockings every night before going to bed and put on every morning when getting up. - right leg o Elevate legs to the level of the heart and pump ankles as often as possible o Elevate leg(s) parallel to the floor when sitting. o DO YOUR BEST to sleep in the bed at night. DO NOT sleep in your recliner. Long hours of sitting in a recliner leads to swelling of the legs and/or potential wounds on your backside. Off-Loading o Other: - Offloading boots while in bed; Try pillow under sheet to keep it in place. Wound Treatment Wound #3 - Foot Wound Laterality: Left Cleanser: Soap and Water 1 x Per Week/30 Days Discharge Instructions: Gently cleanse wound with antibacterial soap, rinse and pat dry prior to dressing wounds Primary Dressing: Hydrofera Blue Ready Transfer Foam, 2.5x2.5 (in/in) 1 x Per Week/30 Days Discharge Instructions: Apply Hydrofera Blue Ready to wound bed as directed Secondary Dressing: Gauze 1 x Per Week/30 Days Discharge Instructions: As directed: dry, moistened with saline or moistened with Dakins Solution Compression Wrap: Profore Lite LF 3 Multilayer Compression Bandaging System 1 x Per Week/30 Days Discharge Instructions: Apply 3  multi-layer wrap as prescribed. Electronic Signature(s) Signed: 12/21/2020 12:18:06 PM By: Gretta Cool, BSN, RN, CWS, Kim RN, BSN Signed: 12/21/2020 4:56:27 PM By: Worthy Keeler PA-C Entered By: Gretta Cool, BSN, RN, CWS, Kim on 12/21/2020 12:16:01 Strassman, Belia L. ([id number]) -------------------------------------------------------------------------------- Problem List Details Patient Name: [name] [name], [name] Date of Service: 12/21/2020 11:30 AM Medical Record Number: [id number] Patient Account Number: [id number] Date of Birth/Sex: 1926/08/27 (87 y.o. F) Treating RN: Cornell Barman Primary Care Provider: [name] Other Clinician: Referring Provider: [name] Treating Provider/Extender: Skipper Cliche in Treatment: 25 Active Problems ICD-10 Encounter Code Description Active Date MDM Diagnosis [phone] Pressure ulcer of other site, stage 3 10/02/2020 No Yes I10 Essential (primary) hypertension 06/26/2020 No Yes F01.50 Vascular dementia without behavioral disturbance 06/26/2020 No Yes I25.10 Atherosclerotic heart disease of native coronary artery without angina 06/26/2020 No Yes pectoris Inactive Problems Resolved Problems ICD-10 Code Description Active Date Resolved Date L89.623 Pressure ulcer of left heel, stage 3 06/26/2020 06/26/2020 Electronic Signature(s) Signed: 12/21/2020 11:48:41 AM By: Worthy Keeler PA-C Entered By: Worthy Keeler on 12/21/2020 11:48:40 [name] [name], [name] [name] ([id number]) -------------------------------------------------------------------------------- Progress Note Details Patient Name: [name] [name],  [name] Date of Service: 12/21/2020 11:30 AM Medical Record Number: [id number] Patient Account Number: [id number] Date of Birth/Sex: [birth year] (87 y.o. F) Treating RN: Cornell Barman Primary Care Provider: [name] Other Clinician: Referring Provider: [name] Treating Provider/Extender: Skipper Cliche in Treatment: 25 Subjective Chief  Complaint Information obtained from Patient Left Heel Pressure Ulcer History of Present Illness (HPI) 87 year old patient was recently seen by the PCPs office for significant pain right great toe which has been going on since July. She was initially treated with Keflex which she did not complete and after the office visit this time she has been put on doxycycline. at the time of her visit she was found to have a ulcer on the plantar surface of the right great toe and also had a pyogenic granuloma over this area. X-ray of the right foot done 12/29/2014 -- IMPRESSION:Soft-tissue swelling and ulceration right great toe. No underlying bony lytic lesion identified. If osteomyelitis remains of clinical concern MRI can be obtained. Past medical history significant for anemia, chronic kidney disease stage III, obesity, varicose veins, coronary artery disease, gout, history of nicotine addiction given up smoking in 2002, hypertension, status post cardiac catheterization, status post abdominal hysterectomy, cholecystectomy and tonsillectomy. hemoglobin A1c done in August was 5.8 01/22/2015 -- at this stage the [facility] Walking boat was going to cost them significant amount of money and they want to defer using that at the present time. 01/29/2015 -- she had a podiatry appointment and they have trimmed her toenails. She has not heard back from the vascular office regarding her venous duplex study and I have asked them to call personally so that they can get the appointment soon. 02/06/2015 -- they have made contact with the vascular office and from what I understand that test has been done but the report is pending. 02/19/2014 -- the vascular test is scheduled for tomorrow Readmission: 06/26/2020 upon evaluation today patient appears for initial evaluation in her clinic that she has been here before in 2016 and has been quite sometime. She did have a fractured hip in February on the 23rd 2022. Subsequently this  had to be pinned and she ended up with a pressure injury on her heel following when she was using her foot to help move her around in the bed. Subsequently this has led to the wound that she has been dealing with since that time. She lives at home with her daughter currently she does have dementia. 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 07/03/2020 upon evaluation today patient appears to be doing well with regard to her heel ulcer. She did have arterial studies they appear to be doing excellent it was premature normal across the board with TBI's in the 90s and ABIs well within normal range. Nonetheless there does not appear to be any signs of arterial insufficiency whatsoever. With that being said the patient does have also signs of improvement there is some necrotic tissue in the base of the wound number to try to clear some of that away today I do believe the Iodoflex/Iodosorb is doing well. 5/24; difficult punched out wound on the left medial heel. She has been using Iodoflex 07/17/2020 upon evaluation today patient appears to be doing well at this point in regard to her wound. I do feel like this is a little bit deeper but again that is what is expected as we continue  with the Iodoflex I think that this is just going to get deeper until we get to the base of the wound. With that being said I think we are getting much closer to the base of the wound where we can have healthier tissue that we get a be managing here which that will be awesome. In the meantime I am not surprised by what I am seeing and in fact the wound appears to be better as compared to previous findings. 07/24/2020 upon evaluation today patient appears to be doing well with regard to her wound. Overall I am extremely pleased with where things stand today. I do not see any signs of active infection  which is great and overall I think that the patient is making good progress. There is good to be some need for sharp debridement today. 07/31/2020 upon evaluation today patient appears to be doing better in regard to her heel ulcer. She has been tolerating the dressing changes without complication. Fortunately there does not appear to be any signs of active infection which is great and overall very pleased in this regard. No fevers, chills, nausea, vomiting, or diarrhea. 08/14/2020 upon evaluation today patient appears to be doing better in regard to her heel ulcer. I am very pleased in that regard. Unfortunately she still has a slight deep tissue injury in regard to the medial portion of her foot over the same area. Unfortunately I think this is something that if were not careful it was can open up into the wound. That is my main concern here based on what I see. Fortunately there does not appear to be any evidence of active infection which is great news and overall very pleased with where things stand at this point. 08/21/2020 upon evaluation today patient appears to be doing well with regard to her wound. She has been tolerating the dressing changes without complication. Fortunately there does not appear to be any signs of active infection at this time. No fevers, chills, nausea, vomiting, or diarrhea. I do believe the compression wrap was beneficial for her. 08/28/2020 upon evaluation today patient appears to be doing well with regard to her wounds currently. Fortunately there does not appear to be any signs of active infection at this time. No fevers, chills, nausea, vomiting, or diarrhea. With that being said I think that her leg is doing quite well to be honest. [name] [name], [name] ([id number]) 09/04/2020 upon evaluation today patient appears to be doing well with regard to her wound on the heel. This is showing signs of good epithelial growth although there is probably can it be an indention where  this heals that is okay as long as we get it closed. Fortunately I do not see any evidence of infection at this point. 09/13/2020 upon evaluation today patient appears to be doing well with regard to her wounds. She has been tolerating the dressing changes without complication. Fortunately he is actually doing extremely well and I think she is making great progress this is measuring smaller. I would recommend that such that we continue with the [facility] likely since he is doing so well. 09/18/2020 upon evaluation today patient appears to be doing well with regard to her heel ulcer. She is making good progress and I am very pleased with what we see today. I think the Hydrofera Blue is still doing excellent. 10/02/2020 upon evaluation today patient's wound actually appears to be showing signs of good improvement in regard to the heel. I am very pleased  in this regard. With that being said I do believe that the area which is somewhat been stable and dry is starting to lift up and beginning to clear this away as well that is on the foot. Subsequently I am going to have to perform some debridement here and we did actually go ahead and allow this as a wound today as before it was just more deep tissue injury and eschar covering now I think it is a little bit more than that to be honest. 10/09/2020 upon evaluation today patient appears to be doing decently well in regard to her wounds. I am actually very pleased with where things stand today. There does not appear to be any signs of active infection which is great news. No fevers, chills, nausea, vomiting, or diarrhea. She is going require some sharp debridement today. 10/16/2020 upon evaluation today patient appears to be doing decently well in regard to her heel as well as the foot. Both are showing signs of good improvement which is great news. In general and extremely pleased with where things stand at this point. She is tolerating the [facility]  well although this is getting so small in the heel I think collagen may be a better option here based on what I am seeing. With regard to the foot the Iodoflex/Iodosorb is still probably the best method here. 10/26/2020 upon evaluation today patient actually appears to be doing well in some respects today. She has been tolerating the dressing changes without complication and this is great news. The Hydrofera Blue is done well for the heel this actually appears to be healed today. With that being said in regard to the foot I think we are ready to switch to [facility] based on what I see at this point. 10/30/2020 upon evaluation today patient appears to be doing well with regard to his wound. He has been tolerating the dressing changes without complication. Fortunately there does not appear to be any signs of active infection systemically at this time which is great news and overall very pleased with where things stand today. I do think that she is making progress although it somewhat slow still where showing signs of improvement little by little here. 11/06/2020 upon evaluation today patient appears to be doing decently well in regard to her wound. We will start and see better overall appearance of the base of the wound which is great news she still continues to have some abnormal fluorescence signals with a MolecuLight DX that will be detailed below. 11/20/2020 upon evaluation today patient's wound actually showing signs of improvement. There is good to be some need for sharp debridement today and that was discussed with the patient. I am going to go ahead and clean this up but I do believe the [facility] is doing a great job. 11/27/2020; the patient had eschar over the original heel wound which I removed with a #3 curette there was nothing open here. The area is on the lateral left foot foot. 12/04/2020 upon evaluation today patient appears to be doing well with regard to her wound. The wound bed  actually appeared to be doing well after I remove the dry endoform from the wound bed. This I tracked little bit of fluid but nonetheless underneath this appears to be doing awesome. I am very pleased with where things stand today. No fevers, chills, nausea, vomiting, or diarrhea. 12/11/2020 upon evaluation today patient appears to be doing well with regard to her wound although it keeps  getting dry and filled up with the endoform I am not seeing any signs of infection but we are going to have to clean this away in order to try and get things moving in an appropriate direction. I discussed that with patient's daughter today as well. She is in agreement with proceeding as such. 12/21/2020 upon evaluation today patient appears to be doing well with regard to her wound this did not seal off like it was with the collagen but nonetheless also did not really feeling quite as much as I would like to have seen. I do believe that we may need to go ahead and see about doing a debridement today and I really feel like we may want to switch back to the [facility] which I felt like was doing better in the past. Good news is the base of the wound appears healthy with good granulation tissue. Objective Constitutional Well-nourished and well-hydrated in no acute distress. Vitals Time Taken: 11:42 AM, Height: 58 in, Weight: 137 lbs, BMI: 28.6, Temperature: 98.3 F, Pulse: 81 bpm, Respiratory Rate: 16 breaths/min, Blood Pressure: 143/81 mmHg. Respiratory normal breathing without difficulty. Psychiatric Hersh, Tyisha L. ([id number]) this patient is able to make decisions and demonstrates good insight into disease process. Alert and Oriented x 3. pleasant and cooperative. General Notes: Fortunately there does not appear to be any signs of active infection at this time which is great news. Upon inspection patient's wound bed actually showed signs of good granulation epithelization at this point. I did perform  debridement there was some of the necrotic debris and the patient tolerated that debridement today without complication. Integumentary (Hair, Skin) Wound #3 status is Open. Original cause of wound was Pressure Injury. The date acquired was: 10/02/2020. The wound has been in treatment 11 weeks. The wound is located on the Left Foot. The wound measures 0.2cm length x 0.3cm width x 0.4cm depth; 0.047cm^2 area and 0.019cm^3 volume. There is Fat Layer (Subcutaneous Tissue) exposed. There is no tunneling noted, however, there is undermining starting at 12:00 and ending at 3:00 with a maximum distance of 0.4cm. There is a medium amount of serous drainage noted. The wound margin is thickened. There is medium (34-66%) granulation within the wound bed. There is a medium (34-66%) amount of necrotic tissue within the wound bed including Eschar. Assessment Active Problems ICD-10 Pressure ulcer of other site, stage 3 Essential (primary) hypertension Vascular dementia without behavioral disturbance Atherosclerotic heart disease of native coronary artery without angina pectoris Procedures Wound #3 Pre-procedure diagnosis of Wound #3 is a Pressure Ulcer located on the Left Foot . There was a Excisional Skin/Subcutaneous Tissue Debridement with a total area of 0.25 sq cm performed by Tommie Sams., PA-C. With the following instrument(s): Curette to remove Viable and Non-Viable tissue/material. Material removed includes Callus, Subcutaneous Tissue, and Slough. No specimens were taken. A time out was conducted at 12:04, prior to the start of the procedure. A Moderate amount of bleeding was controlled with Pressure. The procedure was tolerated well. Post Debridement Measurements: 0.5cm length x 0.5cm width x 0.5cm depth; 0.098cm^3 volume. Post debridement Stage noted as Category/Stage III. Character of Wound/Ulcer Post Debridement is stable. Post procedure Diagnosis Wound #3: Same as  Pre-Procedure Plan Follow-up Appointments: Return Appointment in 1 week. Nurse Visit as needed Bathing/ Shower/ Hygiene: May shower with wound dressing protected with water repellent cover or cast protector. No tub bath. Edema Control - Lymphedema / Segmental Compressive Device / Other: Optional: One layer of unna paste  to top of compression wrap (to act as an anchor). Patient to wear own compression stockings. Remove compression stockings every night before going to bed and put on every morning when getting up. - right leg Elevate legs to the level of the heart and pump ankles as often as possible Elevate leg(s) parallel to the floor when sitting. DO YOUR BEST to sleep in the bed at night. DO NOT sleep in your recliner. Long hours of sitting in a recliner leads to swelling of the legs and/or potential wounds on your backside. Off-Loading: Other: - Offloading boots while in bed; Try pillow under sheet to keep it in place. WOUND #3: - Foot Wound Laterality: Left Cleanser: Soap and Water 1 x Per Week/30 Days Discharge Instructions: Gently cleanse wound with antibacterial soap, rinse and pat dry prior to dressing wounds Primary Dressing: Hydrofera Blue Ready Transfer Foam, 2.5x2.5 (in/in) 1 x Per Week/30 Days Discharge Instructions: Apply Hydrofera Blue Ready to wound bed as directed Secondary Dressing: Gauze 1 x Per Week/30 Days Discharge Instructions: As directed: dry, moistened with saline or moistened with Dakins Solution Woolf, Anaiah L. ([id number]) Compression Wrap: Profore Lite LF 3 Multilayer Compression Bandaging System 1 x Per Week/30 Days Discharge Instructions: Apply 3 multi-layer wrap as prescribed. 1. Would recommend currently that we going to continue with the wound care measures as before and the patient is in agreement with that plan. This includes the use of the 3 layer compression wrap which I think is doing a great job. 2. I am also can recommend switch back to the  [facility] which I think will do preferably for the patient as far as getting this to heal him. 3. I am also would recommend we continue to monitor for any signs of worsening or infection the right now I do not see anything of concern at this point. We will see patient back for reevaluation in 1 week here in the clinic. If anything worsens or changes patient will contact our office for additional recommendations. Electronic Signature(s) Signed: 12/21/2020 2:28:04 PM By: Worthy Keeler PA-C Entered By: Worthy Keeler on 12/21/2020 14:28:03 Christina, Latacha L. ([id number]) -------------------------------------------------------------------------------- SuperBill Details Patient Name: Ryker, Arlett L. Date of Service: 12/21/2020 Medical Record Number: [id number] Patient Account Number: [id number] Date of Birth/Sex: 1926-11-13 (87 y.o. F) Treating RN: Cornell Barman Primary Care Provider: [name] Other Clinician: Referring Provider: [name] Treating Provider/Extender: Skipper Cliche in Treatment: 25 Diagnosis Coding ICD-10 Codes Code Description [phone] Pressure ulcer of other site, stage 3 I10 Essential (primary) hypertension F01.50 Vascular dementia without behavioral disturbance I25.10 Atherosclerotic heart disease of native coronary artery without angina pectoris Facility Procedures CPT4 Code: [id number] Description: 25638 - DEB SUBQ TISSUE 20 SQ CM/< Modifier: Quantity: 1 CPT4 Code: Description: ICD-10 Diagnosis Description L89.893 Pressure ulcer of other site, stage 3 Modifier: Quantity: Physician Procedures CPT4 Code: [id number] Description: 11042 - WC PHYS SUBQ TISS 20 SQ CM Modifier: Quantity: 1 CPT4 Code: Description: ICD-10 Diagnosis Description L89.893 Pressure ulcer of other site, stage 3 Modifier: Quantity: Electronic Signature(s) Signed: 12/21/2020 2:28:38 PM By: Worthy Keeler PA-C Entered By: Worthy Keeler on 12/21/2020 14:28:38

## 2020-12-25 ENCOUNTER — Other Ambulatory Visit: Payer: Self-pay | Admitting: Family Medicine

## 2020-12-25 DIAGNOSIS — I82461 Acute embolism and thrombosis of right calf muscular vein: Secondary | ICD-10-CM

## 2020-12-25 MED ORDER — APIXABAN 5 MG PO TABS: 5.0000 mg | ORAL_TABLET | Freq: Two times a day (BID) | ORAL | 1 refills | Status: DC

## 2020-12-28 ENCOUNTER — Other Ambulatory Visit: Payer: Self-pay

## 2020-12-28 ENCOUNTER — Encounter: Payer: PPO | Admitting: Physician Assistant

## 2020-12-28 DIAGNOSIS — L89893 Pressure ulcer of other site, stage 3: Secondary | ICD-10-CM | POA: Diagnosis not present

## 2020-12-28 NOTE — Progress Notes (Signed)
[name], [name] ([id number]) Visit Report for 12/28/2020 Arrival Information Details Patient Name: [name], [name] Date of Service: 12/28/2020 12:15 PM Medical Record Number: [id number] Patient Account Number: [id number] Date of Birth/Sex: [birth year] (87 y.o. F) Treating RN: Donnamarie Poag Primary Care Massiah Longanecker: [name] Other Clinician: Referring Osei Anger: [name] Treating Azuri Bozard/Extender: Skipper Cliche in Treatment: 26 Visit Information History Since Last Visit Added or deleted any medications: No Patient Arrived: Wheel Chair Had a fall or experienced change in No Arrival Time: 12:18 activities of daily living that may affect Accompanied By: daughter risk of falls: Transfer Assistance: EasyPivot Patient Lift Hospitalized since last visit: No Patient Identification Verified: Yes Has Dressing in Place as Prescribed: Yes Secondary Verification Process Completed: Yes Has Compression in Place as Prescribed: Yes Patient Requires Transmission-Based No Pain Present Now: No Precautions: Patient Has Alerts: Yes Patient Alerts: NOT diabetic ABI R1.18 L1.25 06/27/20 Electronic Signature(s) Signed: 12/28/2020 2:10:51 PM By: Donnamarie Poag Entered ByDonnamarie Poag on 12/28/2020 12:22:11 [name], [name] ([id number]) -------------------------------------------------------------------------------- Clinic Level of Care Assessment Details Patient Name: [name] [name], [name] Date of Service: 12/28/2020 12:15 PM Medical Record Number: [id number] Patient Account Number: [id number] Date of Birth/Sex: [birth year] (87 y.o. F) Treating RN: Donnamarie Poag Primary Care Jayleen Afonso: [name] Other Clinician: Referring Derrico Zhong: [name] Treating Presli Fanguy/Extender: Skipper Cliche in Treatment: 26 Clinic Level of Care Assessment Items TOOL 1 Quantity Score _0  - Use when EandM and Procedure is performed on INITIAL visit 0 ASSESSMENTS - Nursing Assessment / Reassessment _1  -  General Physical Exam (combine w/ comprehensive assessment (listed just below) when performed on new 0 pt. evals) _2  - 0 Comprehensive Assessment (HX, ROS, Risk Assessments, Wounds Hx, etc.) ASSESSMENTS - Wound and Skin Assessment / Reassessment _3  - Dermatologic / Skin Assessment (not related to wound area) 0 ASSESSMENTS - Ostomy and/or Continence Assessment and Care _4  - Incontinence Assessment and Management 0 _5  - 0 Ostomy Care Assessment and Management (repouching, etc.) PROCESS - Coordination of Care _6  - Simple Patient / Family Education for ongoing care 0 _7  - 0 Complex (extensive) Patient / Family Education for ongoing care _8  - 0 Staff obtains Programmer, systems, Records, Test Results / Process Orders _9  - 0 Staff telephones HHA, Nursing Homes / Clarify orders / etc _10  - 0 Routine Transfer to another Facility (non-emergent condition) _11  - 0 Routine Hospital Admission (non-emergent condition) _12  - 0 New Admissions / Biomedical engineer / Ordering NPWT, Apligraf, etc. _13  - 0 Emergency Hospital Admission (emergent condition) PROCESS - Special Needs _14  - Pediatric / Minor Patient Management 0 _15  - 0 Isolation Patient Management _16  - 0 Hearing / Language / Visual special needs _17  - 0 Assessment of Community assistance (transportation, D/C planning, etc.) _18  - 0 Additional assistance / Altered mentation _19  - 0 Support Surface(s) Assessment (bed, cushion, seat, etc.) INTERVENTIONS - Miscellaneous _20  - External ear exam 0 _21  - 0 Patient Transfer (multiple staff / Civil Service fast streamer / Similar devices) _22  - 0 Simple Staple / Suture removal (25 or less) _23  - 0 Complex Staple / Suture removal (26 or more) _24  - 0 Hypo/Hyperglycemic Management (do not check if billed separately) _25  - 0 Ankle / Brachial Index (ABI) - do not check if billed separately Has the patient been seen at the hospital within the last three years: Yes Total Score: 0 Level Of Care: ____ [name], [name]  ([id number]) Electronic Signature(s) Signed: 12/28/2020 2:10:51 PM By: Donnamarie Poag Entered By: Donnamarie Poag on 12/28/2020 12:48:44 [name], [name] (  [id number]) -------------------------------------------------------------------------------- Compression Therapy Details Patient Name: [name] [name], [name] Date of Service: 12/28/2020 12:15 PM Medical Record Number: [id number] Patient Account Number: [id number] Date of Birth/Sex: 07/10/1926 (87 y.o. F) Treating RN: Donnamarie Poag Primary Care Blannie Shedlock: [name] Other Clinician: Referring Glyn Gerads: [name] Treating Rosana Farnell/Extender: Skipper Cliche in Treatment: 26 Compression Therapy Performed for Wound Assessment: Wound #3 Left Foot Performed By: Clinician Donnamarie Poag, RN Compression Type: Three Layer Post Procedure Diagnosis Same as Pre-procedure Electronic Signature(s) Signed: 12/28/2020 2:10:51 PM By: Donnamarie Poag Entered By: Donnamarie Poag on 12/28/2020 12:46:31 [name], [name] ([id number]) -------------------------------------------------------------------------------- Encounter Discharge Information Details Patient Name: [name] [name], [name] Date of Service: 12/28/2020 12:15 PM Medical Record Number: [id number] Patient Account Number: [id number] Date of Birth/Sex: April 23, 1926 (87 y.o. F) Treating RN: Donnamarie Poag Primary Care Jonquil Stubbe: [name] Other Clinician: Referring Shaheen Mende: [name] Treating Dyson Sevey/Extender: Skipper Cliche in Treatment: 26 Encounter Discharge Information Items Post Procedure Vitals Discharge Condition: Stable Temperature (F): 97.9 Ambulatory Status: Wheelchair Pulse (bpm): 79 Discharge Destination: Home Respiratory Rate (breaths/min): 16 Transportation: Private Auto Blood Pressure (mmHg): 133/80 Accompanied By: Derek Jack Schedule Follow-up Appointment: Yes Clinical Summary of Care: Electronic Signature(s) Signed: 12/28/2020 2:10:51 PM By: Donnamarie Poag Entered ByDonnamarie Poag  on 12/28/2020 12:51:55 Yurko, Millicent L. ([id number]) -------------------------------------------------------------------------------- Lower Extremity Assessment Details Patient Name: [name] [name], [name] Date of Service: 12/28/2020 12:15 PM Medical Record Number: [id number] Patient Account Number: [id number] Date of Birth/Sex: November 04, 1926 (87 y.o. F) Treating RN: Donnamarie Poag Primary Care Alton Bouknight: [name] Other Clinician: Referring Deari Sessler: [name] Treating Brookie Wayment/Extender: Skipper Cliche in Treatment: 26 Edema Assessment Assessed: [Left: Yes] [Right: No] Edema: [Left: Ye] [Right: s] Calf Left: Right: Point of Measurement: 32 cm From Medial Instep 32 cm Ankle Left: Right: Point of Measurement: 12 cm From Medial Instep 21.5 cm Vascular Assessment Pulses: Dorsalis Pedis Doppler Audible: [Left:Yes] Electronic Signature(s) Signed: 12/28/2020 2:10:51 PM By: Donnamarie Poag Entered ByDonnamarie Poag on 12/28/2020 12:32:54 Menser, Tequita L. ([id number]) -------------------------------------------------------------------------------- Multi Wound Chart Details Patient Name: [name] [name], [name] Date of Service: 12/28/2020 12:15 PM Medical Record Number: [id number] Patient Account Number: [id number] Date of Birth/Sex: Sep 09, 1926 (87 y.o. F) Treating RN: Donnamarie Poag Primary Care Elowen Debruyn: [name] Other Clinician: Referring Rashiya Lofland: [name] Treating Dmonte Maher/Extender: Skipper Cliche in Treatment: 26 Vital Signs Height(in): 58 Pulse(bpm): 34 Weight(lbs): 137 Blood Pressure(mmHg): 133/80 Body Mass Index(BMI): 29 Temperature(F): 97.9 Respiratory Rate(breaths/min): 16 Photos: [N/A:N/A] Wound Location: Left Foot N/A N/A Wounding Event: Pressure Injury N/A N/A Primary Etiology: Pressure Ulcer N/A N/A Comorbid History: Cataracts, Anemia, Coronary Artery N/A N/A Disease, Hypertension, Myocardial Infarction, End Stage Renal Disease, Gout,  Osteoarthritis, Dementia Date Acquired: 10/02/2020 N/A N/A Weeks of Treatment: 12 N/A N/A Wound Status: Open N/A N/A Measurements L x W x D (cm) 0.3x0.3x0.3 N/A N/A Area (cm) : 0.071 N/A N/A Volume (cm) : 0.021 N/A N/A % Reduction in Area: 77.40% N/A N/A % Reduction in Volume: 32.30% N/A N/A Starting Position 1 (o'clock): 12 Ending Position 1 (o'clock): 5 Maximum Distance 1 (cm): 0.3 Undermining: Yes N/A N/A Classification: Category/Stage III N/A N/A Exudate Amount: Medium N/A N/A Exudate Type: Serous N/A N/A Exudate Color: amber N/A N/A Wound Margin: Thickened N/A N/A Granulation Amount: Medium (34-66%) N/A N/A Granulation Quality: Red, Pink N/A N/A Necrotic Amount: Medium (34-66%) N/A N/A Exposed Structures: Fat Layer (Subcutaneous Tissue): N/A N/A Yes Fascia: No Tendon: No Muscle: No Joint: No Bone: No Epithelialization: None N/A N/A Treatment Notes Electronic Signature(s) Torosian, Jovonne L. ([id number]) Signed: 12/28/2020  2:10:51 PM By: Donnamarie Poag Entered ByDonnamarie Poag on 12/28/2020 12:34:42 Wurzer, Dajane L. ([id number]) -------------------------------------------------------------------------------- [place] Details Patient Name: [name] [name], [name] Date of Service: 12/28/2020 12:15 PM Medical Record Number: [id number] Patient Account Number: [id number] Date of Birth/Sex: [birth year] (87 y.o. F) Treating RN: Donnamarie Poag Primary Care Hieu Herms: [name] Other Clinician: Referring Adriel Kessen: [name] Treating Obe Ahlers/Extender: Skipper Cliche in Treatment: 26 Active Inactive Necrotic Tissue Nursing Diagnoses: Impaired tissue integrity related to necrotic/devitalized tissue Knowledge deficit related to management of necrotic/devitalized tissue Goals: Necrotic/devitalized tissue will be minimized in the wound bed Date Initiated: 12/21/2020 Target Resolution Date: 12/21/2020 Goal Status: Active Patient/caregiver will verbalize  understanding of reason and process for debridement of necrotic tissue Date Initiated: 12/21/2020 Date Inactivated: 12/28/2020 Target Resolution Date: 12/21/2020 Goal Status: Met Interventions: Assess patient pain level pre-, during and post procedure and prior to discharge Provide education on necrotic tissue and debridement process Treatment Activities: Excisional debridement : 12/21/2020 Notes: Electronic Signature(s) Signed: 12/28/2020 2:10:51 PM By: Donnamarie Poag Entered ByDonnamarie Poag on 12/28/2020 12:33:20 Gordin, Kambree L. ([id number]) -------------------------------------------------------------------------------- Pain Assessment Details Patient Name: [name] [name], [name] Date of Service: 12/28/2020 12:15 PM Medical Record Number: [id number] Patient Account Number: [id number] Date of Birth/Sex: Jul 16, 1926 (87 y.o. F) Treating RN: Donnamarie Poag Primary Care Charlann Wayne: [name] Other Clinician: Referring Sherma Vanmetre: [name] Treating Aston Lawhorn/Extender: Skipper Cliche in Treatment: 26 Active Problems Location of Pain Severity and Description of Pain Patient Has Paino No Site Locations Rate the pain. Current Pain Level: 0 Pain Management and Medication Current Pain Management: Electronic Signature(s) Signed: 12/28/2020 2:10:51 PM By: Donnamarie Poag Entered By: Donnamarie Poag on 12/28/2020 12:28:29 Clementson, Angeleen L. ([id number]) -------------------------------------------------------------------------------- Patient/Caregiver Education Details Patient Name: [name] [name], [name] Date of Service: 12/28/2020 12:15 PM Medical Record Number: [id number] Patient Account Number: [id number] Date of Birth/Gender: 1926/05/25 (87 y.o. F) Treating RN: Donnamarie Poag Primary Care Physician: [name] Other Clinician: Referring Physician: [name] Treating Physician/Extender: Skipper Cliche in Treatment: 26 Education Assessment Education Provided To: Patient and  Caregiver Education Topics Provided Wound Debridement: Wound/Skin Impairment: Electronic Signature(s) Signed: 12/28/2020 2:10:51 PM By: Donnamarie Poag Entered By: Donnamarie Poag on 12/28/2020 12:49:23 Gingrich, Alliyah L. ([id number]) -------------------------------------------------------------------------------- Wound Assessment Details Patient Name: [name] [name], [name] Date of Service: 12/28/2020 12:15 PM Medical Record Number: [id number] Patient Account Number: [id number] Date of Birth/Sex: Aug 28, 1926 (87 y.o. F) Treating RN: Donnamarie Poag Primary Care Mase Dhondt: [name] Other Clinician: Referring Charmon Thorson: [name] Treating Jillisa Harris/Extender: Skipper Cliche in Treatment: 26 Wound Status Wound Number: 3 Primary Pressure Ulcer Etiology: Wound Location: Left Foot Wound Open Wounding Event: Pressure Injury Status: Date Acquired: 10/02/2020 Comorbid Cataracts, Anemia, Coronary Artery Disease, Weeks Of Treatment: 12 History: Hypertension, Myocardial Infarction, End Stage Renal Clustered Wound: No Disease, Gout, Osteoarthritis, Dementia Photos Wound Measurements Length: (cm) 0.3 % Red Width: (cm) 0.3 % Red Depth: (cm) 0.3 Epith Area: (cm) 0.071 Tunn Volume: (cm) 0.021 Unde St En Ma uction in Area: 77.4% uction in Volume: 32.3% elialization: None eling: No rmining: Yes arting Position (o'clock): 12 ding Position (o'clock): 5 ximum Distance: (cm) 0.3 Wound Description Classification: Category/Stage III Foul Wound Margin: Thickened Slou Exudate Amount: Medium Exudate Type: Serous Exudate Color: amber Odor After Cleansing: No gh/Fibrino Yes Wound Bed Granulation Amount: Medium (34-66%) Exposed Structure Granulation Quality: Red, Pink Fascia Exposed: No Necrotic Amount: Medium (34-66%) Fat Layer (Subcutaneous Tissue) Exposed: Yes Necrotic Quality: Adherent Slough Tendon Exposed: No Muscle Exposed: No Joint Exposed: No Bone Exposed: No Treatment  Notes Wound #3 (Foot) Wound Laterality: Left Cleanser Brunson, Dalayah L. ([id number]) Soap and Water Discharge Instruction: Gently cleanse wound with antibacterial soap, rinse and pat dry prior to dressing wounds Peri-Wound Care Topical Primary Dressing Hydrofera Blue Ready Transfer Foam, 2.5x2.5 (in/in) Discharge Instruction: Apply Hydrofera Blue Ready to wound bed as directed Secondary Dressing Gauze Discharge Instruction: As directed: dry, moistened with saline or moistened with Dakins Solution Secured With Compression Wrap Profore Lite LF 3 Multilayer Compression Taft Discharge Instruction: Apply 3 multi-layer wrap as prescribed. Compression Stockings Add-Ons Electronic Signature(s) Signed: 12/28/2020 2:10:51 PM By: Donnamarie Poag Entered By: Donnamarie Poag on 12/28/2020 12:31:44 Meikle, Wilhelmine L. ([id number]) -------------------------------------------------------------------------------- Vitals Details Patient Name: [name] [name], [name] Date of Service: 12/28/2020 12:15 PM Medical Record Number: [id number] Patient Account Number: [id number] Date of Birth/Sex: Aug 21, 1926 (87 y.o. F) Treating RN: Donnamarie Poag Primary Care Chinaza Rooke: [name] Other Clinician: Referring Taras Rask: [name] Treating Henri Guedes/Extender: Skipper Cliche in Treatment: 26 Vital Signs Time Taken: 12:25 Temperature (F): 97.9 Height (in): 58 Pulse (bpm): 79 Weight (lbs): 137 Respiratory Rate (breaths/min): 16 Body Mass Index (BMI): 28.6 Blood Pressure (mmHg): 133/80 Reference Range: 80 - 120 mg / dl Electronic Signature(s) Signed: 12/28/2020 2:10:51 PM By: Donnamarie Poag Entered ByDonnamarie Poag on 12/28/2020 12:28:21

## 2020-12-28 NOTE — Progress Notes (Addendum)
[name] [name], [name] ([id number]) Visit Report for 12/28/2020 Chief Complaint Document Details Patient Name: [name], [name] Date of Service: 12/28/2020 12:15 PM Medical Record Number: [id number] Patient Account Number: [id number] Date of Birth/Sex: Jun 08, 1926 (87 y.o. F) Treating RN: Donnamarie Poag Primary Care Provider: [name] Other Clinician: Referring Provider: [name] Treating Provider/Extender: Skipper Cliche in Treatment: 26 Information Obtained from: Patient Chief Complaint Left Heel Pressure Ulcer Electronic Signature(s) Signed: 12/28/2020 12:45:54 PM By: Worthy Keeler PA-C Entered By: Worthy Keeler on 12/28/2020 12:45:54 [name] [name], [name] ([id number]) -------------------------------------------------------------------------------- Debridement Details Patient Name: [name], [name] Date of Service: 12/28/2020 12:15 PM Medical Record Number: [id number] Patient Account Number: [id number] Date of Birth/Sex: February 26, 1926 (87 y.o. F) Treating RN: Donnamarie Poag Primary Care Provider: [name] Other Clinician: Referring Provider: [name] Treating Provider/Extender: Skipper Cliche in Treatment: 26 Debridement Performed for Wound #3 Left Foot Assessment: Performed By: Physician Tommie Sams., PA-C Debridement Type: Debridement Level of Consciousness (Pre- Awake and Alert procedure): Pre-procedure Verification/Time Out Yes - 12:47 Taken: Start Time: 12:47 Pain Control: Lidocaine Total Area Debrided (L x W): 0.3 (cm) x 0.3 (cm) = 0.09 (cm) Tissue and other material Viable, Non-Viable, Callus, Slough, Subcutaneous, Slough debrided: Level: Skin/Subcutaneous Tissue Debridement Description: Excisional Instrument: Curette Bleeding: Minimum Hemostasis Achieved: Pressure Response to Treatment: Procedure was tolerated well Level of Consciousness (Post- Awake and Alert procedure): Post Debridement Measurements of Total Wound Length: (cm)  0.3 Stage: Category/Stage III Width: (cm) 0.3 Depth: (cm) 0.3 Volume: (cm) 0.021 Character of Wound/Ulcer Post Debridement: Improved Post Procedure Diagnosis Same as Pre-procedure Electronic Signature(s) Signed: 12/28/2020 2:10:51 PM By: Donnamarie Poag Signed: 12/28/2020 4:22:43 PM By: Worthy Keeler PA-C Entered By: Donnamarie Poag on 12/28/2020 12:50:33 [name] [name], [name] ([id number]) -------------------------------------------------------------------------------- HPI Details Patient Name: [name] [name], [name] Date of Service: 12/28/2020 12:15 PM Medical Record Number: [id number] Patient Account Number: [id number] Date of Birth/Sex: 29-Sep-1926 (87 y.o. F) Treating RN: Donnamarie Poag Primary Care Provider: [name] Other Clinician: Referring Provider: [name] Treating Provider/Extender: Skipper Cliche in Treatment: 26 History of Present Illness HPI Description: 87 year old patient was recently seen by the PCPs office for significant pain right great toe which has been going on since July. She was initially treated with Keflex which she did not complete and after the office visit this time she has been put on doxycycline. at the time of her visit she was found to have a ulcer on the plantar surface of the right great toe and also had a pyogenic granuloma over this area. X-ray of the right foot done 12/29/2014 -- IMPRESSION:Soft-tissue swelling and ulceration right great toe. No underlying bony lytic lesion identified. If osteomyelitis remains of clinical concern MRI can be obtained. Past medical history significant for anemia, chronic kidney disease stage III, obesity, varicose veins, coronary artery disease, gout, history of nicotine addiction given up smoking in 2002, hypertension, status post cardiac catheterization, status post abdominal hysterectomy, cholecystectomy and tonsillectomy. hemoglobin A1c done in August was 5.8 01/22/2015 -- at this stage the [facility] Walking boat was  going to cost them significant amount of money and they want to defer using that at the present time. 01/29/2015 -- she had a podiatry appointment and they have trimmed her toenails. She has not heard back from the vascular office regarding her venous duplex study and I have asked them to call personally so that they can get the appointment soon. 02/06/2015 -- they have made contact with the vascular office and from what I  understand that test has been done but the report is pending. 02/19/2014 -- the vascular test is scheduled for tomorrow Readmission: 06/26/2020 upon evaluation today patient appears for initial evaluation in her clinic that she has been here before in 2016 and has been quite sometime. She did have a fractured hip in February on the 23rd 2022. Subsequently this had to be pinned and she ended up with a pressure injury on her heel following when she was using her foot to help move her around in the bed. Subsequently this has led to the wound that she has been dealing with since that time. She lives at home with her daughter currently she does have dementia. 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 07/03/2020 upon evaluation today patient appears to be doing well with regard to her heel ulcer. She did have arterial studies they appear to be doing excellent it was premature normal across the board with TBI's in the 90s and ABIs well within normal range. Nonetheless there does not appear to be any signs of arterial insufficiency whatsoever. With that being said the patient does have also signs of improvement there is some necrotic tissue in the base of the wound number to try to clear some of that away today I do believe the Iodoflex/Iodosorb is doing well. 5/24; difficult punched out wound on the left medial heel. She has been using Iodoflex 07/17/2020  upon evaluation today patient appears to be doing well at this point in regard to her wound. I do feel like this is a little bit deeper but again that is what is expected as we continue with the Iodoflex I think that this is just going to get deeper until we get to the base of the wound. With that being said I think we are getting much closer to the base of the wound where we can have healthier tissue that we get a be managing here which that will be awesome. In the meantime I am not surprised by what I am seeing and in fact the wound appears to be better as compared to previous findings. 07/24/2020 upon evaluation today patient appears to be doing well with regard to her wound. Overall I am extremely pleased with where things stand today. I do not see any signs of active infection which is great and overall I think that the patient is making good progress. There is good to be some need for sharp debridement today. 07/31/2020 upon evaluation today patient appears to be doing better in regard to her heel ulcer. She has been tolerating the dressing changes without complication. Fortunately there does not appear to be any signs of active infection which is great and overall very pleased in this regard. No fevers, chills, nausea, vomiting, or diarrhea. 08/14/2020 upon evaluation today patient appears to be doing better in regard to her heel ulcer. I am very pleased in that regard. Unfortunately she still has a slight deep tissue injury in regard to the medial portion of her foot over the same area. Unfortunately I think this is something that if were not careful it was can open up into the wound. That is my main concern here based on what I see. Fortunately there does not appear to be any evidence of active infection which is great news and overall very pleased with where things stand at this point. 08/21/2020 upon evaluation today patient  appears to be doing well with regard to her wound. She has been  tolerating the dressing changes without complication. Fortunately there does not appear to be any signs of active infection at this time. No fevers, chills, nausea, vomiting, or diarrhea. I do believe the compression wrap was beneficial for her. 08/28/2020 upon evaluation today patient appears to be doing well with regard to her wounds currently. Fortunately there does not appear to be any signs of active infection at this time. No fevers, chills, nausea, vomiting, or diarrhea. With that being said I think that her leg is doing quite well to be honest. 09/04/2020 upon evaluation today patient appears to be doing well with regard to her wound on the heel. This is showing signs of good epithelial growth although there is probably can it be an indention where this heals that is okay as long as we get it closed. Fortunately I do not see any evidence of infection at this point. 09/13/2020 upon evaluation today patient appears to be doing well with regard to her wounds. She has been tolerating the dressing changes Messamore, Stevey L. ([id number]) without complication. Fortunately he is actually doing extremely well and I think she is making great progress this is measuring smaller. I would recommend that such that we continue with the [facility] likely since he is doing so well. 09/18/2020 upon evaluation today patient appears to be doing well with regard to her heel ulcer. She is making good progress and I am very pleased with what we see today. I think the Hydrofera Blue is still doing excellent. 10/02/2020 upon evaluation today patient's wound actually appears to be showing signs of good improvement in regard to the heel. I am very pleased in this regard. With that being said I do believe that the area which is somewhat been stable and dry is starting to lift up and beginning to clear this away as well that is on the foot. Subsequently I am going to have to perform some debridement here and we did actually  go ahead and allow this as a wound today as before it was just more deep tissue injury and eschar covering now I think it is a little bit more than that to be honest. 10/09/2020 upon evaluation today patient appears to be doing decently well in regard to her wounds. I am actually very pleased with where things stand today. There does not appear to be any signs of active infection which is great news. No fevers, chills, nausea, vomiting, or diarrhea. She is going require some sharp debridement today. 10/16/2020 upon evaluation today patient appears to be doing decently well in regard to her heel as well as the foot. Both are showing signs of good improvement which is great news. In general and extremely pleased with where things stand at this point. She is tolerating the [facility] well although this is getting so small in the heel I think collagen may be a better option here based on what I am seeing. With regard to the foot the Iodoflex/Iodosorb is still probably the best method here. 10/26/2020 upon evaluation today patient actually appears to be doing well in some respects today. She has been tolerating the dressing changes without complication and this is great news. The Hydrofera Blue is done well for the heel this actually appears to be healed today. With that being said in regard to the foot I think we are ready to switch to [facility] based on what I see at  this point. 10/30/2020 upon evaluation today patient appears to be doing well with regard to his wound. He has been tolerating the dressing changes without complication. Fortunately there does not appear to be any signs of active infection systemically at this time which is great news and overall very pleased with where things stand today. I do think that she is making progress although it somewhat slow still where showing signs of improvement little by little here. 11/06/2020 upon evaluation today patient appears to be doing decently  well in regard to her wound. We will start and see better overall appearance of the base of the wound which is great news she still continues to have some abnormal fluorescence signals with a MolecuLight DX that will be detailed below. 11/20/2020 upon evaluation today patient's wound actually showing signs of improvement. There is good to be some need for sharp debridement today and that was discussed with the patient. I am going to go ahead and clean this up but I do believe the [facility] is doing a great job. 11/27/2020; the patient had eschar over the original heel wound which I removed with a #3 curette there was nothing open here. The area is on the lateral left foot foot. 12/04/2020 upon evaluation today patient appears to be doing well with regard to her wound. The wound bed actually appeared to be doing well after I remove the dry endoform from the wound bed. This I tracked little bit of fluid but nonetheless underneath this appears to be doing awesome. I am very pleased with where things stand today. No fevers, chills, nausea, vomiting, or diarrhea. 12/11/2020 upon evaluation today patient appears to be doing well with regard to her wound although it keeps getting dry and filled up with the endoform I am not seeing any signs of infection but we are going to have to clean this away in order to try and get things moving in an appropriate direction. I discussed that with patient's daughter today as well. She is in agreement with proceeding as such. 12/21/2020 upon evaluation today patient appears to be doing well with regard to her wound this did not seal off like it was with the collagen but nonetheless also did not really feeling quite as much as I would like to have seen. I do believe that we may need to go ahead and see about doing a debridement today and I really feel like we may want to switch back to the [facility] which I felt like was doing better in the past. Good news is the  base of the wound appears healthy with good granulation tissue. 12/28/2020 upon evaluation patient's wound actually showed signs of doing quite well in regard to her foot. I think that we getting very close to complete resolution and overall I am extremely happy with where things stand today. Electronic Signature(s) Signed: 12/28/2020 3:13:21 PM By: Worthy Keeler PA-C Entered By: Worthy Keeler on 12/28/2020 15:13:20 [name] [name], [name] ([id number]) -------------------------------------------------------------------------------- Physical Exam Details Patient Name: Rijos, Allyna L. Date of Service: 12/28/2020 12:15 PM Medical Record Number: [id number] Patient Account Number: [id number] Date of Birth/Sex: 08/17/1926 (87 y.o. F) Treating RN: Donnamarie Poag Primary Care Provider: [name] Other Clinician: Referring Provider: [name] Treating Provider/Extender: Skipper Cliche in Treatment: 34 Constitutional Well-nourished and well-hydrated in no acute distress. Respiratory normal breathing without difficulty. Psychiatric this patient is able to make decisions and demonstrates good insight into disease process. Alert and Oriented x 3. pleasant and  cooperative. Notes Patient's wound did require sharp debridement to clear away some of the necrotic debris and she tolerated this today without complication. Post debridement wound bed appears to be doing much better and overall I think that we are headed in the appropriate direction. Electronic Signature(s) Signed: 12/28/2020 3:13:38 PM By: Worthy Keeler PA-C Entered By: Worthy Keeler on 12/28/2020 15:13:37 [name] [name], [name] ([id number]) -------------------------------------------------------------------------------- Physician Orders Details Patient Name: Sobek, Otila L. Date of Service: 12/28/2020 12:15 PM Medical Record Number: [id number] Patient Account Number: [id number] Date of Birth/Sex: 1926-08-31 (87 y.o.  F) Treating RN: Donnamarie Poag Primary Care Provider: [name] Other Clinician: Referring Provider: [name] Treating Provider/Extender: Skipper Cliche in Treatment: 41 Verbal / Phone Orders: No Diagnosis Coding ICD-10 Coding Code Description [phone] Pressure ulcer of other site, stage 3 I10 Essential (primary) hypertension F01.50 Vascular dementia without behavioral disturbance I25.10 Atherosclerotic heart disease of native coronary artery without angina pectoris Follow-up Appointments o Return Appointment in 1 week. o Nurse Visit as needed Bathing/ Shower/ Hygiene o May shower with wound dressing protected with water repellent cover or cast protector. o No tub bath. Edema Control - Lymphedema / Segmental Compressive Device / Other o Optional: One layer of unna paste to top of compression wrap (to act as an anchor). o Patient to wear own compression stockings. Remove compression stockings every night before going to bed and put on every morning when getting up. - right leg o Elevate legs to the level of the heart and pump ankles as often as possible o Elevate leg(s) parallel to the floor when sitting. o DO YOUR BEST to sleep in the bed at night. DO NOT sleep in your recliner. Long hours of sitting in a recliner leads to swelling of the legs and/or potential wounds on your backside. Off-Loading o Other: - Offloading boots while in bed; Try pillow under sheet to keep it in place. Wound Treatment Wound #3 - Foot Wound Laterality: Left Cleanser: Soap and Water 1 x Per Week/30 Days Discharge Instructions: Gently cleanse wound with antibacterial soap, rinse and pat dry prior to dressing wounds Primary Dressing: Hydrofera Blue Ready Transfer Foam, 2.5x2.5 (in/in) 1 x Per Week/30 Days Discharge Instructions: Apply Hydrofera Blue Ready to wound bed as directed Secondary Dressing: Gauze 1 x Per Week/30 Days Discharge Instructions: As directed: dry,  moistened with saline or moistened with Dakins Solution Compression Wrap: Profore Lite LF 3 Multilayer Compression Bandaging System 1 x Per Week/30 Days Discharge Instructions: Apply 3 multi-layer wrap as prescribed. Electronic Signature(s) Signed: 12/28/2020 2:10:51 PM By: Donnamarie Poag Signed: 12/28/2020 4:22:43 PM By: Worthy Keeler PA-C Entered By: Donnamarie Poag on 12/28/2020 12:47:47 [name] [name], [name] ([id number]) -------------------------------------------------------------------------------- Problem List Details Patient Name: [name] [name], Lenna L. Date of Service: 12/28/2020 12:15 PM Medical Record Number: [id number] Patient Account Number: [id number] Date of Birth/Sex: Jul 13, 1926 (87 y.o. F) Treating RN: Donnamarie Poag Primary Care Provider: [name] Other Clinician: Referring Provider: [name] Treating Provider/Extender: Skipper Cliche in Treatment: 26 Active Problems ICD-10 Encounter Code Description Active Date MDM Diagnosis [phone] Pressure ulcer of other site, stage 3 10/02/2020 No Yes I10 Essential (primary) hypertension 06/26/2020 No Yes F01.50 Vascular dementia without behavioral disturbance 06/26/2020 No Yes I25.10 Atherosclerotic heart disease of native coronary artery without angina 06/26/2020 No Yes pectoris Inactive Problems Resolved Problems ICD-10 Code Description Active Date Resolved Date L89.623 Pressure ulcer of left heel, stage 3 06/26/2020 06/26/2020 Electronic Signature(s) Signed: 12/28/2020 12:45:43 PM By: Worthy Keeler PA-C Entered By:  Worthy Keeler on 12/28/2020 12:45:42 [name] [name], [name] ([id number]) -------------------------------------------------------------------------------- Progress Note Details Patient Name: Vanorder, Ciearra L. Date of Service: 12/28/2020 12:15 PM Medical Record Number: [id number] Patient Account Number: [id number] Date of Birth/Sex: [birth year] (87 y.o. F) Treating RN: Donnamarie Poag Primary Care Provider: [name] Other Clinician: Referring Provider: [name] Treating Provider/Extender: Skipper Cliche in Treatment: 26 Subjective Chief Complaint Information obtained from Patient Left Heel Pressure Ulcer History of Present Illness (HPI) 87 year old patient was recently seen by the PCPs office for significant pain right great toe which has been going on since July. She was initially treated with Keflex which she did not complete and after the office visit this time she has been put on doxycycline. at the time of her visit she was found to have a ulcer on the plantar surface of the right great toe and also had a pyogenic granuloma over this area. X-ray of the right foot done 12/29/2014 -- IMPRESSION:Soft-tissue swelling and ulceration right great toe. No underlying bony lytic lesion identified. If osteomyelitis remains of clinical concern MRI can be obtained. Past medical history significant for anemia, chronic kidney disease stage III, obesity, varicose veins, coronary artery disease, gout, history of nicotine addiction given up smoking in 2002, hypertension, status post cardiac catheterization, status post abdominal hysterectomy, cholecystectomy and tonsillectomy. hemoglobin A1c done in August was 5.8 01/22/2015 -- at this stage the [facility] Walking boat was going to cost them significant amount of money and they want to defer using that at the present time. 01/29/2015 -- she had a podiatry appointment and they have trimmed her toenails. She has not heard back from the vascular office regarding her venous duplex study and I have asked them to call personally so that they can get the appointment soon. 02/06/2015 -- they have made contact with the vascular office and from what I understand that test has been done but the report is pending. 02/19/2014 -- the vascular test is scheduled for tomorrow Readmission: 06/26/2020 upon evaluation today patient appears for initial evaluation in her clinic  that she has been here before in 2016 and has been quite sometime. She did have a fractured hip in February on the 23rd 2022. Subsequently this had to be pinned and she ended up with a pressure injury on her heel following when she was using her foot to help move her around in the bed. Subsequently this has led to the wound that she has been dealing with since that time. She lives at home with her daughter currently she does have dementia. 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 07/03/2020 upon evaluation today patient appears to be doing well with regard to her heel ulcer. She did have arterial studies they appear to be doing excellent it was premature normal across the board with TBI's in the 90s and ABIs well within normal range. Nonetheless there does not appear to be any signs of arterial insufficiency whatsoever. With that being said the patient does have also signs of improvement there is some necrotic tissue in the base of the wound number to try to clear some of that away today I do believe the Iodoflex/Iodosorb is doing well. 5/24; difficult punched out wound on the left medial heel. She has been using Iodoflex 07/17/2020 upon evaluation today patient appears to be doing well at this point in regard to her wound. I do feel  like this is a little bit deeper but again that is what is expected as we continue with the Iodoflex I think that this is just going to get deeper until we get to the base of the wound. With that being said I think we are getting much closer to the base of the wound where we can have healthier tissue that we get a be managing here which that will be awesome. In the meantime I am not surprised by what I am seeing and in fact the wound appears to be better as compared to previous findings. 07/24/2020 upon evaluation today patient appears to be  doing well with regard to her wound. Overall I am extremely pleased with where things stand today. I do not see any signs of active infection which is great and overall I think that the patient is making good progress. There is good to be some need for sharp debridement today. 07/31/2020 upon evaluation today patient appears to be doing better in regard to her heel ulcer. She has been tolerating the dressing changes without complication. Fortunately there does not appear to be any signs of active infection which is great and overall very pleased in this regard. No fevers, chills, nausea, vomiting, or diarrhea. 08/14/2020 upon evaluation today patient appears to be doing better in regard to her heel ulcer. I am very pleased in that regard. Unfortunately she still has a slight deep tissue injury in regard to the medial portion of her foot over the same area. Unfortunately I think this is something that if were not careful it was can open up into the wound. That is my main concern here based on what I see. Fortunately there does not appear to be any evidence of active infection which is great news and overall very pleased with where things stand at this point. 08/21/2020 upon evaluation today patient appears to be doing well with regard to her wound. She has been tolerating the dressing changes without complication. Fortunately there does not appear to be any signs of active infection at this time. No fevers, chills, nausea, vomiting, or diarrhea. I do believe the compression wrap was beneficial for her. 08/28/2020 upon evaluation today patient appears to be doing well with regard to her wounds currently. Fortunately there does not appear to be any signs of active infection at this time. No fevers, chills, nausea, vomiting, or diarrhea. With that being said I think that her leg is doing quite well to be honest. Legler, [name] ([id number]) 09/04/2020 upon evaluation today patient appears to be doing well  with regard to her wound on the heel. This is showing signs of good epithelial growth although there is probably can it be an indention where this heals that is okay as long as we get it closed. Fortunately I do not see any evidence of infection at this point. 09/13/2020 upon evaluation today patient appears to be doing well with regard to her wounds. She has been tolerating the dressing changes without complication. Fortunately he is actually doing extremely well and I think she is making great progress this is measuring smaller. I would recommend that such that we continue with the [facility] likely since he is doing so well. 09/18/2020 upon evaluation today patient appears to be doing well with regard to her heel ulcer. She is making good progress and I am very pleased with what we see today. I think the Hydrofera Blue is still doing excellent. 10/02/2020 upon evaluation today patient's wound actually  appears to be showing signs of good improvement in regard to the heel. I am very pleased in this regard. With that being said I do believe that the area which is somewhat been stable and dry is starting to lift up and beginning to clear this away as well that is on the foot. Subsequently I am going to have to perform some debridement here and we did actually go ahead and allow this as a wound today as before it was just more deep tissue injury and eschar covering now I think it is a little bit more than that to be honest. 10/09/2020 upon evaluation today patient appears to be doing decently well in regard to her wounds. I am actually very pleased with where things stand today. There does not appear to be any signs of active infection which is great news. No fevers, chills, nausea, vomiting, or diarrhea. She is going require some sharp debridement today. 10/16/2020 upon evaluation today patient appears to be doing decently well in regard to her heel as well as the foot. Both are showing signs of good  improvement which is great news. In general and extremely pleased with where things stand at this point. She is tolerating the [facility] well although this is getting so small in the heel I think collagen may be a better option here based on what I am seeing. With regard to the foot the Iodoflex/Iodosorb is still probably the best method here. 10/26/2020 upon evaluation today patient actually appears to be doing well in some respects today. She has been tolerating the dressing changes without complication and this is great news. The Hydrofera Blue is done well for the heel this actually appears to be healed today. With that being said in regard to the foot I think we are ready to switch to [facility] based on what I see at this point. 10/30/2020 upon evaluation today patient appears to be doing well with regard to his wound. He has been tolerating the dressing changes without complication. Fortunately there does not appear to be any signs of active infection systemically at this time which is great news and overall very pleased with where things stand today. I do think that she is making progress although it somewhat slow still where showing signs of improvement little by little here. 11/06/2020 upon evaluation today patient appears to be doing decently well in regard to her wound. We will start and see better overall appearance of the base of the wound which is great news she still continues to have some abnormal fluorescence signals with a MolecuLight DX that will be detailed below. 11/20/2020 upon evaluation today patient's wound actually showing signs of improvement. There is good to be some need for sharp debridement today and that was discussed with the patient. I am going to go ahead and clean this up but I do believe the [facility] is doing a great job. 11/27/2020; the patient had eschar over the original heel wound which I removed with a #3 curette there was nothing open here. The  area is on the lateral left foot foot. 12/04/2020 upon evaluation today patient appears to be doing well with regard to her wound. The wound bed actually appeared to be doing well after I remove the dry endoform from the wound bed. This I tracked little bit of fluid but nonetheless underneath this appears to be doing awesome. I am very pleased with where things stand today. No fevers, chills, nausea, vomiting, or diarrhea. 12/11/2020  upon evaluation today patient appears to be doing well with regard to her wound although it keeps getting dry and filled up with the endoform I am not seeing any signs of infection but we are going to have to clean this away in order to try and get things moving in an appropriate direction. I discussed that with patient's daughter today as well. She is in agreement with proceeding as such. 12/21/2020 upon evaluation today patient appears to be doing well with regard to her wound this did not seal off like it was with the collagen but nonetheless also did not really feeling quite as much as I would like to have seen. I do believe that we may need to go ahead and see about doing a debridement today and I really feel like we may want to switch back to the [facility] which I felt like was doing better in the past. Good news is the base of the wound appears healthy with good granulation tissue. 12/28/2020 upon evaluation patient's wound actually showed signs of doing quite well in regard to her foot. I think that we getting very close to complete resolution and overall I am extremely happy with where things stand today. Objective Constitutional Well-nourished and well-hydrated in no acute distress. Vitals Time Taken: 12:25 PM, Height: 58 in, Weight: 137 lbs, BMI: 28.6, Temperature: 97.9 F, Pulse: 79 bpm, Respiratory Rate: 16 breaths/min, Blood Pressure: 133/80 mmHg. Respiratory [name] [name], [name] ([id number]) normal breathing without difficulty. Psychiatric this  patient is able to make decisions and demonstrates good insight into disease process. Alert and Oriented x 3. pleasant and cooperative. General Notes: Patient's wound did require sharp debridement to clear away some of the necrotic debris and she tolerated this today without complication. Post debridement wound bed appears to be doing much better and overall I think that we are headed in the appropriate direction. Integumentary (Hair, Skin) Wound #3 status is Open. Original cause of wound was Pressure Injury. The date acquired was: 10/02/2020. The wound has been in treatment 12 weeks. The wound is located on the Left Foot. The wound measures 0.3cm length x 0.3cm width x 0.3cm depth; 0.071cm^2 area and 0.021cm^3 volume. There is Fat Layer (Subcutaneous Tissue) exposed. There is no tunneling noted, however, there is undermining starting at 12:00 and ending at 5:00 with a maximum distance of 0.3cm. There is a medium amount of serous drainage noted. The wound margin is thickened. There is medium (34-66%) red, pink granulation within the wound bed. There is a medium (34-66%) amount of necrotic tissue within the wound bed including Adherent Slough. Assessment Active Problems ICD-10 Pressure ulcer of other site, stage 3 Essential (primary) hypertension Vascular dementia without behavioral disturbance Atherosclerotic heart disease of native coronary artery without angina pectoris Procedures Wound #3 Pre-procedure diagnosis of Wound #3 is a Pressure Ulcer located on the Left Foot . There was a Excisional Skin/Subcutaneous Tissue Debridement with a total area of 0.09 sq cm performed by Tommie Sams., PA-C. With the following instrument(s): Curette to remove Viable and Non-Viable tissue/material. Material removed includes Callus, Subcutaneous Tissue, and Slough after achieving pain control using Lidocaine. A time out was conducted at 12:47, prior to the start of the procedure. A Minimum amount of bleeding  was controlled with Pressure. The procedure was tolerated well. Post Debridement Measurements: 0.3cm length x 0.3cm width x 0.3cm depth; 0.021cm^3 volume. Post debridement Stage noted as Category/Stage III. Character of Wound/Ulcer Post Debridement is improved. Post procedure Diagnosis Wound #3: Same  as Pre-Procedure Pre-procedure diagnosis of Wound #3 is a Pressure Ulcer located on the Left Foot . There was a Three Layer Compression Therapy Procedure by Donnamarie Poag, RN. Post procedure Diagnosis Wound #3: Same as Pre-Procedure Plan Follow-up Appointments: Return Appointment in 1 week. Nurse Visit as needed Bathing/ Shower/ Hygiene: May shower with wound dressing protected with water repellent cover or cast protector. No tub bath. Edema Control - Lymphedema / Segmental Compressive Device / Other: Optional: One layer of unna paste to top of compression wrap (to act as an anchor). Patient to wear own compression stockings. Remove compression stockings every night before going to bed and put on every morning when getting up. - right leg Elevate legs to the level of the heart and pump ankles as often as possible Elevate leg(s) parallel to the floor when sitting. DO YOUR BEST to sleep in the bed at night. DO NOT sleep in your recliner. Long hours of sitting in a recliner leads to swelling of the legs and/or potential wounds on your backside. Off-Loading: Other: - Offloading boots while in bed; Try pillow under sheet to keep it in place. [name] [name], [name] ([id number]) WOUND #3: - Foot Wound Laterality: Left Cleanser: Soap and Water 1 x Per Week/30 Days Discharge Instructions: Gently cleanse wound with antibacterial soap, rinse and pat dry prior to dressing wounds Primary Dressing: Hydrofera Blue Ready Transfer Foam, 2.5x2.5 (in/in) 1 x Per Week/30 Days Discharge Instructions: Apply Hydrofera Blue Ready to wound bed as directed Secondary Dressing: Gauze 1 x Per Week/30 Days Discharge  Instructions: As directed: dry, moistened with saline or moistened with Dakins Solution Compression Wrap: Profore Lite LF 3 Multilayer Compression Bandaging System 1 x Per Week/30 Days Discharge Instructions: Apply 3 multi-layer wrap as prescribed. 1. I would recommend that we going to continue with the wound care measures as before and the patient is in agreement with plan this includes the use of the [facility] which I think is still doing a great job. 1. We will continue to cover this with a gauze followed by 3 layer compression wrap which I think is doing awesome. We will see patient back for reevaluation in 1 week here in the clinic. If anything worsens or changes patient will contact our office for additional recommendations. Electronic Signature(s) Signed: 12/28/2020 3:14:09 PM By: Worthy Keeler PA-C Entered By: Worthy Keeler on 12/28/2020 15:14:09 [name] [name], [name] ([id number]) -------------------------------------------------------------------------------- SuperBill Details Patient Name: [name] [name], [name] Date of Service: 12/28/2020 Medical Record Number: [id number] Patient Account Number: [id number] Date of Birth/Sex: [birth year] (87 y.o. F) Treating RN: Donnamarie Poag Primary Care Provider: [name] Other Clinician: Referring Provider: [name] Treating Provider/Extender: Skipper Cliche in Treatment: 26 Diagnosis Coding ICD-10 Codes Code Description [phone] Pressure ulcer of other site, stage 3 I10 Essential (primary) hypertension F01.50 Vascular dementia without behavioral disturbance I25.10 Atherosclerotic heart disease of native coronary artery without angina pectoris Facility Procedures CPT4 Code: [id number] Description: 25956 - DEB SUBQ TISSUE 20 SQ CM/< Modifier: Quantity: 1 CPT4 Code: Description: ICD-10 Diagnosis Description L89.893 Pressure ulcer of other site, stage 3 Modifier: Quantity: Physician Procedures CPT4 Code: [id number] Description:  11042 - WC PHYS SUBQ TISS 20 SQ CM Modifier: Quantity: 1 CPT4 Code: Description: ICD-10 Diagnosis Description L89.893 Pressure ulcer of other site, stage 3 Modifier: Quantity: Electronic Signature(s) Signed: 12/28/2020 3:14:43 PM By: Worthy Keeler PA-C Previous Signature: 12/28/2020 2:10:51 PM Version By: Donnamarie Poag Entered By: Worthy Keeler on 12/28/2020 15:14:43

## 2021-01-04 ENCOUNTER — Encounter: Payer: PPO | Admitting: Physician Assistant

## 2021-01-04 ENCOUNTER — Other Ambulatory Visit: Payer: Self-pay

## 2021-01-04 DIAGNOSIS — L89893 Pressure ulcer of other site, stage 3: Secondary | ICD-10-CM | POA: Diagnosis not present

## 2021-01-04 NOTE — Progress Notes (Addendum)
Aigner, Christe L. ([id number]) Visit Report for 01/04/2021 Chief Complaint Document Details Patient Name: [name] [name], [name] Date of Service: 01/04/2021 1:45 PM Medical Record Number: [id number] Patient Account Number: [id number] Date of Birth/Sex: 12-18-1926 (87 y.o. F) Treating RN: Donnamarie Poag Primary Care Provider: [name] Other Clinician: Referring Provider: [name] Treating Provider/Extender: Skipper Cliche in Treatment: 27 Information Obtained from: Patient Chief Complaint Left Heel Pressure Ulcer Electronic Signature(s) Signed: 01/04/2021 2:23:02 PM By: Worthy Keeler PA-C Entered By: Worthy Keeler on 01/04/2021 14:23:02 [name] [name], [name] ([id number]) -------------------------------------------------------------------------------- Debridement Details Patient Name: [name] [name], [name] Date of Service: 01/04/2021 1:45 PM Medical Record Number: [id number] Patient Account Number: [id number] Date of Birth/Sex: 1926/05/13 (87 y.o. F) Treating RN: Donnamarie Poag Primary Care Provider: [name] Other Clinician: Referring Provider: [name] Treating Provider/Extender: Skipper Cliche in Treatment: 27 Debridement Performed for Wound #3 Left Foot Assessment: Performed By: Physician Tommie Sams., PA-C Debridement Type: Debridement Level of Consciousness (Pre- Awake and Alert procedure): Pre-procedure Verification/Time Out Yes - 14:28 Taken: Start Time: 14:29 Pain Control: Lidocaine Total Area Debrided (L x W): 0.2 (cm) x 0.2 (cm) = 0.04 (cm) Tissue and other material Viable, Non-Viable, Slough, Subcutaneous, Slough debrided: Level: Skin/Subcutaneous Tissue Debridement Description: Excisional Instrument: Curette Bleeding: Minimum Hemostasis Achieved: Pressure Response to Treatment: Procedure was tolerated well Level of Consciousness (Post- Awake and Alert procedure): Post Debridement Measurements of Total Wound Length: (cm) 0.2 Stage:  Category/Stage III Width: (cm) 0.2 Depth: (cm) 0.3 Volume: (cm) 0.009 Character of Wound/Ulcer Post Debridement: Improved Post Procedure Diagnosis Same as Pre-procedure Electronic Signature(s) Signed: 01/04/2021 5:42:25 PM By: Worthy Keeler PA-C Signed: 01/07/2021 10:03:00 AM By: Donnamarie Poag Entered By: Donnamarie Poag on 01/04/2021 14:28:43 [name] [name], [name] ([id number]) -------------------------------------------------------------------------------- HPI Details Patient Name: [name] [name], [name] Date of Service: 01/04/2021 1:45 PM Medical Record Number: [id number] Patient Account Number: [id number] Date of Birth/Sex: 21-Jan-1927 (87 y.o. F) Treating RN: Donnamarie Poag Primary Care Provider: [name] Other Clinician: Referring Provider: [name] Treating Provider/Extender: Skipper Cliche in Treatment: 27 History of Present Illness HPI Description: 87 year old patient was recently seen by the PCPs office for significant pain right great toe which has been going on since July. She was initially treated with Keflex which she did not complete and after the office visit this time she has been put on doxycycline. at the time of her visit she was found to have a ulcer on the plantar surface of the right great toe and also had a pyogenic granuloma over this area. X-ray of the right foot done 12/29/2014 -- IMPRESSION:Soft-tissue swelling and ulceration right great toe. No underlying bony lytic lesion identified. If osteomyelitis remains of clinical concern MRI can be obtained. Past medical history significant for anemia, chronic kidney disease stage III, obesity, varicose veins, coronary artery disease, gout, history of nicotine addiction given up smoking in 2002, hypertension, status post cardiac catheterization, status post abdominal hysterectomy, cholecystectomy and tonsillectomy. hemoglobin A1c done in August was 5.8 01/22/2015 -- at this stage the [facility] Walking boat was going to cost  them significant amount of money and they want to defer using that at the present time. 01/29/2015 -- she had a podiatry appointment and they have trimmed her toenails. She has not heard back from the vascular office regarding her venous duplex study and I have asked them to call personally so that they can get the appointment soon. 02/06/2015 -- they have made contact with the vascular office and from what I understand  that test has been done but the report is pending. 02/19/2014 -- the vascular test is scheduled for tomorrow Readmission: 06/26/2020 upon evaluation today patient appears for initial evaluation in her clinic that she has been here before in 2016 and has been quite sometime. She did have a fractured hip in February on the 23rd 2022. Subsequently this had to be pinned and she ended up with a pressure injury on her heel following when she was using her foot to help move her around in the bed. Subsequently this has led to the wound that she has been dealing with since that time. She lives at home with her daughter currently she does have dementia. 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 07/03/2020 upon evaluation today patient appears to be doing well with regard to her heel ulcer. She did have arterial studies they appear to be doing excellent it was premature normal across the board with TBI's in the 90s and ABIs well within normal range. Nonetheless there does not appear to be any signs of arterial insufficiency whatsoever. With that being said the patient does have also signs of improvement there is some necrotic tissue in the base of the wound number to try to clear some of that away today I do believe the Iodoflex/Iodosorb is doing well. 5/24; difficult punched out wound on the left medial heel. She has been using Iodoflex 07/17/2020 upon evaluation  today patient appears to be doing well at this point in regard to her wound. I do feel like this is a little bit deeper but again that is what is expected as we continue with the Iodoflex I think that this is just going to get deeper until we get to the base of the wound. With that being said I think we are getting much closer to the base of the wound where we can have healthier tissue that we get a be managing here which that will be awesome. In the meantime I am not surprised by what I am seeing and in fact the wound appears to be better as compared to previous findings. 07/24/2020 upon evaluation today patient appears to be doing well with regard to her wound. Overall I am extremely pleased with where things stand today. I do not see any signs of active infection which is great and overall I think that the patient is making good progress. There is good to be some need for sharp debridement today. 07/31/2020 upon evaluation today patient appears to be doing better in regard to her heel ulcer. She has been tolerating the dressing changes without complication. Fortunately there does not appear to be any signs of active infection which is great and overall very pleased in this regard. No fevers, chills, nausea, vomiting, or diarrhea. 08/14/2020 upon evaluation today patient appears to be doing better in regard to her heel ulcer. I am very pleased in that regard. Unfortunately she still has a slight deep tissue injury in regard to the medial portion of her foot over the same area. Unfortunately I think this is something that if were not careful it was can open up into the wound. That is my main concern here based on what I see. Fortunately there does not appear to be any evidence of active infection which is great news and overall very pleased with where things stand at this point. 08/21/2020 upon evaluation today patient appears  to be doing well with regard to her wound. She has been tolerating the dressing  changes without complication. Fortunately there does not appear to be any signs of active infection at this time. No fevers, chills, nausea, vomiting, or diarrhea. I do believe the compression wrap was beneficial for her. 08/28/2020 upon evaluation today patient appears to be doing well with regard to her wounds currently. Fortunately there does not appear to be any signs of active infection at this time. No fevers, chills, nausea, vomiting, or diarrhea. With that being said I think that her leg is doing quite well to be honest. 09/04/2020 upon evaluation today patient appears to be doing well with regard to her wound on the heel. This is showing signs of good epithelial growth although there is probably can it be an indention where this heals that is okay as long as we get it closed. Fortunately I do not see any evidence of infection at this point. 09/13/2020 upon evaluation today patient appears to be doing well with regard to her wounds. She has been tolerating the dressing changes Jager, Dewayne L. ([id number]) without complication. Fortunately he is actually doing extremely well and I think she is making great progress this is measuring smaller. I would recommend that such that we continue with the [facility] likely since he is doing so well. 09/18/2020 upon evaluation today patient appears to be doing well with regard to her heel ulcer. She is making good progress and I am very pleased with what we see today. I think the Hydrofera Blue is still doing excellent. 10/02/2020 upon evaluation today patient's wound actually appears to be showing signs of good improvement in regard to the heel. I am very pleased in this regard. With that being said I do believe that the area which is somewhat been stable and dry is starting to lift up and beginning to clear this away as well that is on the foot. Subsequently I am going to have to perform some debridement here and we did actually go ahead and allow  this as a wound today as before it was just more deep tissue injury and eschar covering now I think it is a little bit more than that to be honest. 10/09/2020 upon evaluation today patient appears to be doing decently well in regard to her wounds. I am actually very pleased with where things stand today. There does not appear to be any signs of active infection which is great news. No fevers, chills, nausea, vomiting, or diarrhea. She is going require some sharp debridement today. 10/16/2020 upon evaluation today patient appears to be doing decently well in regard to her heel as well as the foot. Both are showing signs of good improvement which is great news. In general and extremely pleased with where things stand at this point. She is tolerating the [facility] well although this is getting so small in the heel I think collagen may be a better option here based on what I am seeing. With regard to the foot the Iodoflex/Iodosorb is still probably the best method here. 10/26/2020 upon evaluation today patient actually appears to be doing well in some respects today. She has been tolerating the dressing changes without complication and this is great news. The Hydrofera Blue is done well for the heel this actually appears to be healed today. With that being said in regard to the foot I think we are ready to switch to [facility] based on what I see at this  point. 10/30/2020 upon evaluation today patient appears to be doing well with regard to his wound. He has been tolerating the dressing changes without complication. Fortunately there does not appear to be any signs of active infection systemically at this time which is great news and overall very pleased with where things stand today. I do think that she is making progress although it somewhat slow still where showing signs of improvement little by little here. 11/06/2020 upon evaluation today patient appears to be doing decently well in regard to her  wound. We will start and see better overall appearance of the base of the wound which is great news she still continues to have some abnormal fluorescence signals with a MolecuLight DX that will be detailed below. 11/20/2020 upon evaluation today patient's wound actually showing signs of improvement. There is good to be some need for sharp debridement today and that was discussed with the patient. I am going to go ahead and clean this up but I do believe the [facility] is doing a great job. 11/27/2020; the patient had eschar over the original heel wound which I removed with a #3 curette there was nothing open here. The area is on the lateral left foot foot. 12/04/2020 upon evaluation today patient appears to be doing well with regard to her wound. The wound bed actually appeared to be doing well after I remove the dry endoform from the wound bed. This I tracked little bit of fluid but nonetheless underneath this appears to be doing awesome. I am very pleased with where things stand today. No fevers, chills, nausea, vomiting, or diarrhea. 12/11/2020 upon evaluation today patient appears to be doing well with regard to her wound although it keeps getting dry and filled up with the endoform I am not seeing any signs of infection but we are going to have to clean this away in order to try and get things moving in an appropriate direction. I discussed that with patient's daughter today as well. She is in agreement with proceeding as such. 12/21/2020 upon evaluation today patient appears to be doing well with regard to her wound this did not seal off like it was with the collagen but nonetheless also did not really feeling quite as much as I would like to have seen. I do believe that we may need to go ahead and see about doing a debridement today and I really feel like we may want to switch back to the [facility] which I felt like was doing better in the past. Good news is the base of the wound  appears healthy with good granulation tissue. 12/28/2020 upon evaluation patient's wound actually showed signs of doing quite well in regard to her foot. I think that we getting very close to complete resolution and overall I am extremely happy with where things stand today. 01/04/2021 upon evaluation today patient appears to be doing well with regard to her wound on the foot. This is good to require little bit of debridement today but overall seems to be doing quite well. I am actually very pleased with where we stand currently. Electronic Signature(s) Signed: 01/04/2021 4:59:39 PM By: Worthy Keeler PA-C Entered By: Worthy Keeler on 01/04/2021 16:59:38 [name] [name], [name] ([id number]) -------------------------------------------------------------------------------- Physical Exam Details Patient Name: [name] [name], [name] Date of Service: 01/04/2021 1:45 PM Medical Record Number: [id number] Patient Account Number: [id number] Date of Birth/Sex: [birth year] (87 y.o. F) Treating RN: Donnamarie Poag Primary Care Provider: [name] Other Clinician: Referring Provider:  [name] Treating Provider/Extender: Jeri Cos Weeks in Treatment: 61 Constitutional Well-nourished and well-hydrated in no acute distress. Respiratory normal breathing without difficulty. Psychiatric this patient is able to make decisions and demonstrates good insight into disease process. Alert and Oriented x 3. pleasant and cooperative. Notes Patient's wound bed showed again good granulation and a lot of areas I did perform debridement to clear away some of the callus as well as clear away some of the necrotic tissue on the surface of the wound deep within this actually appears to be healing quite nicely. Electronic Signature(s) Signed: 01/04/2021 5:00:08 PM By: Worthy Keeler PA-C Entered By: Worthy Keeler on 01/04/2021 17:00:08 Vineyard, [name]  ([id number]) -------------------------------------------------------------------------------- Physician Orders Details Patient Name: [name] [name], [name] Date of Service: 01/04/2021 1:45 PM Medical Record Number: [id number] Patient Account Number: [id number] Date of Birth/Sex: 1926/04/20 (87 y.o. F) Treating RN: Donnamarie Poag Primary Care Provider: [name] Other Clinician: Referring Provider: [name] Treating Provider/Extender: Skipper Cliche in Treatment: 23 Verbal / Phone Orders: No Diagnosis Coding ICD-10 Coding Code Description [phone] Pressure ulcer of other site, stage 3 I10 Essential (primary) hypertension F01.50 Vascular dementia without behavioral disturbance I25.10 Atherosclerotic heart disease of native coronary artery without angina pectoris Follow-up Appointments o Return Appointment in 1 week. o Nurse Visit as needed Bathing/ Shower/ Hygiene o May shower with wound dressing protected with water repellent cover or cast protector. o No tub bath. Edema Control - Lymphedema / Segmental Compressive Device / Other o Optional: One layer of unna paste to top of compression wrap (to act as an anchor). o Patient to wear own compression stockings. Remove compression stockings every night before going to bed and put on every morning when getting up. - right leg o Elevate legs to the level of the heart and pump ankles as often as possible o Elevate leg(s) parallel to the floor when sitting. o DO YOUR BEST to sleep in the bed at night. DO NOT sleep in your recliner. Long hours of sitting in a recliner leads to swelling of the legs and/or potential wounds on your backside. Off-Loading o Other: - Offloading boots while in bed; Try pillow under sheet to keep it in place. Wound Treatment Wound #3 - Foot Wound Laterality: Left Cleanser: Soap and Water 1 x Per Week/30 Days Discharge Instructions: Gently cleanse wound with antibacterial soap, rinse and  pat dry prior to dressing wounds Primary Dressing: Hydrofera Blue Ready Transfer Foam, 2.5x2.5 (in/in) 1 x Per Week/30 Days Discharge Instructions: Apply Hydrofera Blue Ready to wound bed as directed Secondary Dressing: Gauze 1 x Per Week/30 Days Discharge Instructions: As directed: dry, moistened with saline or moistened with Dakins Solution Compression Wrap: Profore Lite LF 3 Multilayer Compression Bandaging System 1 x Per Week/30 Days Discharge Instructions: Apply 3 multi-layer wrap as prescribed. Electronic Signature(s) Signed: 01/04/2021 5:42:25 PM By: Worthy Keeler PA-C Signed: 01/07/2021 10:03:00 AM By: Donnamarie Poag Entered By: Donnamarie Poag on 01/04/2021 14:29:15 [name] [name], [name] ([id number]) -------------------------------------------------------------------------------- Problem List Details Patient Name: Hentz, Camaryn L. Date of Service: 01/04/2021 1:45 PM Medical Record Number: [id number] Patient Account Number: [id number] Date of Birth/Sex: 1926/09/17 (87 y.o. F) Treating RN: Donnamarie Poag Primary Care Provider: [name] Other Clinician: Referring Provider: [name] Treating Provider/Extender: Skipper Cliche in Treatment: 27 Active Problems ICD-10 Encounter Code Description Active Date MDM Diagnosis L89.893 Pressure ulcer of other site, stage 3 10/02/2020 No Yes I10 Essential (primary) hypertension 06/26/2020 No Yes F01.50 Vascular dementia without behavioral disturbance  06/26/2020 No Yes I25.10 Atherosclerotic heart disease of native coronary artery without angina 06/26/2020 No Yes pectoris Inactive Problems Resolved Problems ICD-10 Code Description Active Date Resolved Date L89.623 Pressure ulcer of left heel, stage 3 06/26/2020 06/26/2020 Electronic Signature(s) Signed: 01/04/2021 2:22:43 PM By: Worthy Keeler PA-C Entered By: Worthy Keeler on 01/04/2021 14:22:43 [name] [name], [name] [name]  ([id number]) -------------------------------------------------------------------------------- Progress Note Details Patient Name: [name] [name], [name] Date of Service: 01/04/2021 1:45 PM Medical Record Number: [id number] Patient Account Number: [id number] Date of Birth/Sex: [birth year] (87 y.o. F) Treating RN: Donnamarie Poag Primary Care Provider: [name] Other Clinician: Referring Provider: [name] Treating Provider/Extender: Skipper Cliche in Treatment: 27 Subjective Chief Complaint Information obtained from Patient Left Heel Pressure Ulcer History of Present Illness (HPI) 87 year old patient was recently seen by the PCPs office for significant pain right great toe which has been going on since July. She was initially treated with Keflex which she did not complete and after the office visit this time she has been put on doxycycline. at the time of her visit she was found to have a ulcer on the plantar surface of the right great toe and also had a pyogenic granuloma over this area. X-ray of the right foot done 12/29/2014 -- IMPRESSION:Soft-tissue swelling and ulceration right great toe. No underlying bony lytic lesion identified. If osteomyelitis remains of clinical concern MRI can be obtained. Past medical history significant for anemia, chronic kidney disease stage III, obesity, varicose veins, coronary artery disease, gout, history of nicotine addiction given up smoking in 2002, hypertension, status post cardiac catheterization, status post abdominal hysterectomy, cholecystectomy and tonsillectomy. hemoglobin A1c done in August was 5.8 01/22/2015 -- at this stage the [facility] Walking boat was going to cost them significant amount of money and they want to defer using that at the present time. 01/29/2015 -- she had a podiatry appointment and they have trimmed her toenails. She has not heard back from the vascular office regarding her venous duplex study and I have asked them to call  personally so that they can get the appointment soon. 02/06/2015 -- they have made contact with the vascular office and from what I understand that test has been done but the report is pending. 02/19/2014 -- the vascular test is scheduled for tomorrow Readmission: 06/26/2020 upon evaluation today patient appears for initial evaluation in her clinic that she has been here before in 2016 and has been quite sometime. She did have a fractured hip in February on the 23rd 2022. Subsequently this had to be pinned and she ended up with a pressure injury on her heel following when she was using her foot to help move her around in the bed. Subsequently this has led to the wound that she has been dealing with since that time. She lives at home with her daughter currently she does have dementia. 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 07/03/2020 upon evaluation today patient appears to be doing well with regard to her heel ulcer. She did have arterial studies they appear to be doing excellent it was premature normal across the board with TBI's in the 90s and ABIs well within normal range. Nonetheless there does not appear to be any signs of arterial insufficiency whatsoever. With that being said the patient does have also signs of improvement there is some necrotic tissue in the base of the wound number to try  to clear some of that away today I do believe the Iodoflex/Iodosorb is doing well. 5/24; difficult punched out wound on the left medial heel. She has been using Iodoflex 07/17/2020 upon evaluation today patient appears to be doing well at this point in regard to her wound. I do feel like this is a little bit deeper but again that is what is expected as we continue with the Iodoflex I think that this is just going to get deeper until we get to the base of the wound. With that  being said I think we are getting much closer to the base of the wound where we can have healthier tissue that we get a be managing here which that will be awesome. In the meantime I am not surprised by what I am seeing and in fact the wound appears to be better as compared to previous findings. 07/24/2020 upon evaluation today patient appears to be doing well with regard to her wound. Overall I am extremely pleased with where things stand today. I do not see any signs of active infection which is great and overall I think that the patient is making good progress. There is good to be some need for sharp debridement today. 07/31/2020 upon evaluation today patient appears to be doing better in regard to her heel ulcer. She has been tolerating the dressing changes without complication. Fortunately there does not appear to be any signs of active infection which is great and overall very pleased in this regard. No fevers, chills, nausea, vomiting, or diarrhea. 08/14/2020 upon evaluation today patient appears to be doing better in regard to her heel ulcer. I am very pleased in that regard. Unfortunately she still has a slight deep tissue injury in regard to the medial portion of her foot over the same area. Unfortunately I think this is something that if were not careful it was can open up into the wound. That is my main concern here based on what I see. Fortunately there does not appear to be any evidence of active infection which is great news and overall very pleased with where things stand at this point. 08/21/2020 upon evaluation today patient appears to be doing well with regard to her wound. She has been tolerating the dressing changes without complication. Fortunately there does not appear to be any signs of active infection at this time. No fevers, chills, nausea, vomiting, or diarrhea. I do believe the compression wrap was beneficial for her. 08/28/2020 upon evaluation today patient appears to be doing  well with regard to her wounds currently. Fortunately there does not appear to be any signs of active infection at this time. No fevers, chills, nausea, vomiting, or diarrhea. With that being said I think that her leg is doing quite well to be honest. [name] [name], [name] ([id number]) 09/04/2020 upon evaluation today patient appears to be doing well with regard to her wound on the heel. This is showing signs of good epithelial growth although there is probably can it be an indention where this heals that is okay as long as we get it closed. Fortunately I do not see any evidence of infection at this point. 09/13/2020 upon evaluation today patient appears to be doing well with regard to her wounds. She has been tolerating the dressing changes without complication. Fortunately he is actually doing extremely well and I think she is making great progress this is measuring smaller. I would recommend that such that we continue with the [facility] likely since he  is doing so well. 09/18/2020 upon evaluation today patient appears to be doing well with regard to her heel ulcer. She is making good progress and I am very pleased with what we see today. I think the Hydrofera Blue is still doing excellent. 10/02/2020 upon evaluation today patient's wound actually appears to be showing signs of good improvement in regard to the heel. I am very pleased in this regard. With that being said I do believe that the area which is somewhat been stable and dry is starting to lift up and beginning to clear this away as well that is on the foot. Subsequently I am going to have to perform some debridement here and we did actually go ahead and allow this as a wound today as before it was just more deep tissue injury and eschar covering now I think it is a little bit more than that to be honest. 10/09/2020 upon evaluation today patient appears to be doing decently well in regard to her wounds. I am actually very pleased with where  things stand today. There does not appear to be any signs of active infection which is great news. No fevers, chills, nausea, vomiting, or diarrhea. She is going require some sharp debridement today. 10/16/2020 upon evaluation today patient appears to be doing decently well in regard to her heel as well as the foot. Both are showing signs of good improvement which is great news. In general and extremely pleased with where things stand at this point. She is tolerating the [facility] well although this is getting so small in the heel I think collagen may be a better option here based on what I am seeing. With regard to the foot the Iodoflex/Iodosorb is still probably the best method here. 10/26/2020 upon evaluation today patient actually appears to be doing well in some respects today. She has been tolerating the dressing changes without complication and this is great news. The Hydrofera Blue is done well for the heel this actually appears to be healed today. With that being said in regard to the foot I think we are ready to switch to [facility] based on what I see at this point. 10/30/2020 upon evaluation today patient appears to be doing well with regard to his wound. He has been tolerating the dressing changes without complication. Fortunately there does not appear to be any signs of active infection systemically at this time which is great news and overall very pleased with where things stand today. I do think that she is making progress although it somewhat slow still where showing signs of improvement little by little here. 11/06/2020 upon evaluation today patient appears to be doing decently well in regard to her wound. We will start and see better overall appearance of the base of the wound which is great news she still continues to have some abnormal fluorescence signals with a Molecu[employer] DX that will be detailed below. 11/20/2020 upon evaluation today patient's wound actually showing  signs of improvement. There is good to be some need for sharp debridement today and that was discussed with the patient. I am going to go ahead and clean this up but I do believe the [facility] is doing a great job. 11/27/2020; the patient had eschar over the original heel wound which I removed with a #3 curette there was nothing open here. The area is on the lateral left foot foot. 12/04/2020 upon evaluation today patient appears to be doing well with regard to her wound. The  wound bed actually appeared to be doing well after I remove the dry endoform from the wound bed. This I tracked little bit of fluid but nonetheless underneath this appears to be doing awesome. I am very pleased with where things stand today. No fevers, chills, nausea, vomiting, or diarrhea. 12/11/2020 upon evaluation today patient appears to be doing well with regard to her wound although it keeps getting dry and filled up with the endoform I am not seeing any signs of infection but we are going to have to clean this away in order to try and get things moving in an appropriate direction. I discussed that with patient's daughter today as well. She is in agreement with proceeding as such. 12/21/2020 upon evaluation today patient appears to be doing well with regard to her wound this did not seal off like it was with the collagen but nonetheless also did not really feeling quite as much as I would like to have seen. I do believe that we may need to go ahead and see about doing a debridement today and I really feel like we may want to switch back to the [facility] which I felt like was doing better in the past. Good news is the base of the wound appears healthy with good granulation tissue. 12/28/2020 upon evaluation patient's wound actually showed signs of doing quite well in regard to her foot. I think that we getting very close to complete resolution and overall I am extremely happy with where things stand  today. 01/04/2021 upon evaluation today patient appears to be doing well with regard to her wound on the foot. This is good to require little bit of debridement today but overall seems to be doing quite well. I am actually very pleased with where we stand currently. Objective Constitutional Well-nourished and well-hydrated in no acute distress. Vitals Time Taken: 2:08 PM, Height: 58 in, Weight: 137 lbs, BMI: 28.6, Temperature: 98.3 F, Pulse: 105 bpm, Respiratory Rate: 16 breaths/min, Blood Pressure: 155/92 mmHg. Wegener, Sherika L. ([id number]) Respiratory normal breathing without difficulty. Psychiatric this patient is able to make decisions and demonstrates good insight into disease process. Alert and Oriented x 3. pleasant and cooperative. General Notes: Patient's wound bed showed again good granulation and a lot of areas I did perform debridement to clear away some of the callus as well as clear away some of the necrotic tissue on the surface of the wound deep within this actually appears to be healing quite nicely. Integumentary (Hair, Skin) Wound #3 status is Open. Original cause of wound was Pressure Injury. The date acquired was: 10/02/2020. The wound has been in treatment 13 weeks. The wound is located on the Left Foot. The wound measures 0.2cm length x 0.2cm width x 0.2cm depth; 0.031cm^2 area and 0.006cm^3 volume. There is Fat Layer (Subcutaneous Tissue) exposed. There is no tunneling noted, however, there is undermining starting at 12:00 and ending at 6:00 with a maximum distance of 0.3cm. There is a medium amount of serous drainage noted. The wound margin is thickened. There is medium (34-66%) red, pink granulation within the wound bed. There is a medium (34-66%) amount of necrotic tissue within the wound bed including Adherent Slough. Assessment Active Problems ICD-10 Pressure ulcer of other site, stage 3 Essential (primary) hypertension Vascular dementia without behavioral  disturbance Atherosclerotic heart disease of native coronary artery without angina pectoris Procedures Wound #3 Pre-procedure diagnosis of Wound #3 is a Pressure Ulcer located on the Left Foot . There was a Excisional  Skin/Subcutaneous Tissue Debridement with a total area of 0.04 sq cm performed by Tommie Sams., PA-C. With the following instrument(s): Curette to remove Viable and Non-Viable tissue/material. Material removed includes Subcutaneous Tissue and Slough and after achieving pain control using Lidocaine. A time out was conducted at 14:28, prior to the start of the procedure. A Minimum amount of bleeding was controlled with Pressure. The procedure was tolerated well. Post Debridement Measurements: 0.2cm length x 0.2cm width x 0.3cm depth; 0.009cm^3 volume. Post debridement Stage noted as Category/Stage III. Character of Wound/Ulcer Post Debridement is improved. Post procedure Diagnosis Wound #3: Same as Pre-Procedure Pre-procedure diagnosis of Wound #3 is a Pressure Ulcer located on the Left Foot . There was a Three Layer Compression Therapy Procedure by Donnamarie Poag, RN. Post procedure Diagnosis Wound #3: Same as Pre-Procedure Plan Follow-up Appointments: Return Appointment in 1 week. Nurse Visit as needed Bathing/ Shower/ Hygiene: May shower with wound dressing protected with water repellent cover or cast protector. No tub bath. Edema Control - Lymphedema / Segmental Compressive Device / Other: Optional: One layer of unna paste to top of compression wrap (to act as an anchor). Patient to wear own compression stockings. Remove compression stockings every night before going to bed and put on every morning when getting up. - right leg Elevate legs to the level of the heart and pump ankles as often as possible Elevate leg(s) parallel to the floor when sitting. DO YOUR BEST to sleep in the bed at night. DO NOT sleep in your recliner. Long hours of sitting in a recliner leads to  swelling of the legs and/or Giroux, Elivia L. ([id number]) potential wounds on your backside. Off-Loading: Other: - Offloading boots while in bed; Try pillow under sheet to keep it in place. WOUND #3: - Foot Wound Laterality: Left Cleanser: Soap and Water 1 x Per Week/30 Days Discharge Instructions: Gently cleanse wound with antibacterial soap, rinse and pat dry prior to dressing wounds Primary Dressing: Hydrofera Blue Ready Transfer Foam, 2.5x2.5 (in/in) 1 x Per Week/30 Days Discharge Instructions: Apply Hydrofera Blue Ready to wound bed as directed Secondary Dressing: Gauze 1 x Per Week/30 Days Discharge Instructions: As directed: dry, moistened with saline or moistened with Dakins Solution Compression Wrap: Profore Lite LF 3 Multilayer Compression Bandaging System 1 x Per Week/30 Days Discharge Instructions: Apply 3 multi-layer wrap as prescribed. 1. I would recommend currently that we go ahead and continue with the wound care measures as before and the patient is in agreement the plan this includes the use of the [facility] dressing which I think is doing a great job. 2. I am also can recommend at this time that we continue with a 3 layer compression wrap which I feel like is also doing extremely well for the patient. 3. I would also recommend she continue elevating her leg which she does seem to do at home as well. We will see patient back for reevaluation in 1 week here in the clinic. If anything worsens or changes patient will contact our office for additional recommendations. Electronic Signature(s) Signed: 01/04/2021 5:00:43 PM By: Worthy Keeler PA-C Entered By: Worthy Keeler on 01/04/2021 17:00:43 Lorek, Zakkiyya L. ([id number]) -------------------------------------------------------------------------------- SuperBill Details Patient Name: Gewirtz, Kristiann L. Date of Service: 01/04/2021 Medical Record Number: [id number] Patient Account Number: [id number] Date of  Birth/Sex: 01-12-1927 (87 y.o. F) Treating RN: Donnamarie Poag Primary Care Provider: [name] Other Clinician: Referring Provider: [name] Treating Provider/Extender: Skipper Cliche in Treatment: 27 Diagnosis Coding  ICD-10 Codes Code Description [phone] Pressure ulcer of other site, stage 3 I10 Essential (primary) hypertension F01.50 Vascular dementia without behavioral disturbance I25.10 Atherosclerotic heart disease of native coronary artery without angina pectoris Facility Procedures CPT4 Code: [id number] Description: 35521 - DEB SUBQ TISSUE 20 SQ CM/< Modifier: Quantity: 1 CPT4 Code: Description: ICD-10 Diagnosis Description L89.893 Pressure ulcer of other site, stage 3 Modifier: Quantity: Physician Procedures CPT4 Code: [id number] Description: 11042 - WC PHYS SUBQ TISS 20 SQ CM Modifier: Quantity: 1 CPT4 Code: Description: ICD-10 Diagnosis Description L89.893 Pressure ulcer of other site, stage 3 Modifier: Quantity: Electronic Signature(s) Signed: 01/04/2021 5:01:04 PM By: Worthy Keeler PA-C Entered By: Worthy Keeler on 01/04/2021 17:01:04

## 2021-01-07 NOTE — Progress Notes (Signed)
Dieterich, Cassondra L. ([id number]) Visit Report for 01/04/2021 Arrival Information Details Patient Name: Godlewski, GLORIANNA GOTT. Date of Service: 01/04/2021 1:45 PM Medical Record Number: [id number] Patient Account Number: [id number] Date of Birth/Sex: 12/07/1926 (87 y.o. F) Treating RN: Donnamarie Poag Primary Care Emmersen Garraway: [name] Other Clinician: Referring Jaki Steptoe: [name] Treating Trayquan Kolakowski/Extender: Skipper Cliche in Treatment: 27 Visit Information History Since Last Visit Added or deleted any medications: No Patient Arrived: Wheel Chair Had a fall or experienced change in No Arrival Time: 14:07 activities of daily living that may affect Accompanied By: son risk of falls: Transfer Assistance: EasyPivot Patient Lift Hospitalized since last visit: No Patient Identification Verified: Yes Has Dressing in Place as Prescribed: Yes Secondary Verification Process Completed: Yes Has Compression in Place as Prescribed: Yes Patient Requires Transmission-Based No Pain Present Now: No Precautions: Patient Has Alerts: Yes Patient Alerts: NOT diabetic ABI R1.18 L1.25 06/27/20 Electronic Signature(s) Signed: 01/07/2021 10:03:00 AM By: Donnamarie Poag Entered ByDonnamarie Poag on 01/04/2021 14:08:12 Gallina, Adaiah L. ([id number]) -------------------------------------------------------------------------------- Clinic Level of Care Assessment Details Patient Name: Flaten, Taijah L. Date of Service: 01/04/2021 1:45 PM Medical Record Number: [id number] Patient Account Number: [id number] Date of Birth/Sex: September 23, 1926 (87 y.o. F) Treating RN: Donnamarie Poag Primary Care Creighton Longley: [name] Other Clinician: Referring Tanessa Tidd: [name] Treating Filiberto Wamble/Extender: Skipper Cliche in Treatment: 27 Clinic Level of Care Assessment Items TOOL 1 Quantity Score [] - Use when EandM and Procedure is performed on INITIAL visit 0 ASSESSMENTS - Nursing Assessment / Reassessment [] - General  Physical Exam (combine w/ comprehensive assessment (listed just below) when performed on new 0 pt. evals) [] - 0 Comprehensive Assessment (HX, ROS, Risk Assessments, Wounds Hx, etc.) ASSESSMENTS - Wound and Skin Assessment / Reassessment [] - Dermatologic / Skin Assessment (not related to wound area) 0 ASSESSMENTS - Ostomy and/or Continence Assessment and Care [] - Incontinence Assessment and Management 0 [] - 0 Ostomy Care Assessment and Management (repouching, etc.) PROCESS - Coordination of Care [] - Simple Patient / Family Education for ongoing care 0 [] - 0 Complex (extensive) Patient / Family Education for ongoing care [] - 0 Staff obtains Programmer, systems, Records, Test Results / Process Orders [] - 0 Staff telephones HHA, Nursing Homes / Clarify orders / etc [] - 0 Routine Transfer to another Facility (non-emergent condition) [] - 0 Routine Hospital Admission (non-emergent condition) [] - 0 New Admissions / Biomedical engineer / Ordering NPWT, Apligraf, etc. [] - 0 Emergency Hospital Admission (emergent condition) PROCESS - Special Needs [] - Pediatric / Minor Patient Management 0 [] - 0 Isolation Patient Management [] - 0 Hearing / Language / Visual special needs [] - 0 Assessment of Community assistance (transportation, D/C planning, etc.) [] - 0 Additional assistance / Altered mentation [] - 0 Support Surface(s) Assessment (bed, cushion, seat, etc.) INTERVENTIONS - Miscellaneous [] - External ear exam 0 [] - 0 Patient Transfer (multiple staff / Civil Service fast streamer / Similar devices) [] - 0 Simple Staple / Suture removal (25 or less) [] - 0 Complex Staple / Suture removal (26 or more) [] - 0 Hypo/Hyperglycemic Management (do not check if billed separately) [] - 0 Ankle / Brachial Index (ABI) - do not check if billed separately Has the patient been seen at the hospital within the last three years: Yes Total Score: 0 Level Of Care: ____ Calender, Michalla L.  ([id number]) Electronic Signature(s) Signed: 01/07/2021 10:03:00 AM By: Donnamarie Poag Entered By: Donnamarie Poag on 01/04/2021 14:29:27 Renda, Cody L. (  [id number]) -------------------------------------------------------------------------------- Compression Therapy Details Patient Name: Schwimmer, Bhavana L. Date of Service: 01/04/2021 1:45 PM Medical Record Number: [id number] Patient Account Number: [id number] Date of Birth/Sex: Jul 07, 1926 (87 y.o. F) Treating RN: Donnamarie Poag Primary Care : [name] Other Clinician: Referring : [name] Treating /Extender: Skipper Cliche in Treatment: 27 Compression Therapy Performed for Wound Assessment: Wound #3 Left Foot Performed By: Clinician Donnamarie Poag, RN Compression Type: Three Layer Post Procedure Diagnosis Same as Pre-procedure Electronic Signature(s) Signed: 01/07/2021 10:03:00 AM By: Donnamarie Poag Entered By: Donnamarie Poag on 01/04/2021 14:27:13 Brinlee, Carie L. ([id number]) -------------------------------------------------------------------------------- Encounter Discharge Information Details Patient Name: Lutzke, Samarah L. Date of Service: 01/04/2021 1:45 PM Medical Record Number: [id number] Patient Account Number: [id number] Date of Birth/Sex: 10/23/1926 (87 y.o. F) Treating RN: Donnamarie Poag Primary Care : [name] Other Clinician: Referring : [name] Treating /Extender: Skipper Cliche in Treatment: 27 Encounter Discharge Information Items Post Procedure Vitals Discharge Condition: Stable Temperature (F): 98.3 Ambulatory Status: Wheelchair Pulse (bpm): 105 Discharge Destination: Home Respiratory Rate (breaths/min): 16 Transportation: Private Auto Blood Pressure (mmHg): 155/92 Accompanied By: son Schedule Follow-up Appointment: Yes Clinical Summary of Care: Electronic Signature(s) Signed: 01/07/2021 10:03:00 AM By: Donnamarie Poag Entered ByDonnamarie Poag on  01/04/2021 14:30:39 Raftery, Kila L. ([id number]) -------------------------------------------------------------------------------- Lower Extremity Assessment Details Patient Name: Oconnell, Laguana L. Date of Service: 01/04/2021 1:45 PM Medical Record Number: [id number] Patient Account Number: [id number] Date of Birth/Sex: 03/06/1926 (87 y.o. F) Treating RN: Donnamarie Poag Primary Care : [name] Other Clinician: Referring : [name] Treating /Extender: Skipper Cliche in Treatment: 27 Edema Assessment Assessed: [Left: Yes] Patrice Paradise: No] [Left: Edema] [Right: :] Calf Left: Right: Point of Measurement: 32 cm From Medial Instep 32 cm Ankle Left: Right: Point of Measurement: 12 cm From Medial Instep 21.5 cm Vascular Assessment Pulses: Dorsalis Pedis Palpable: [Left:Yes] Electronic Signature(s) Signed: 01/07/2021 10:03:00 AM By: Donnamarie Poag Entered ByDonnamarie Poag on 01/04/2021 14:14:39 Desanto, Aria L. ([id number]) -------------------------------------------------------------------------------- Multi Wound Chart Details Patient Name: Mcdanel, Martrice L. Date of Service: 01/04/2021 1:45 PM Medical Record Number: [id number] Patient Account Number: [id number] Date of Birth/Sex: [birth year] (87 y.o. F) Treating RN: Donnamarie Poag Primary Care : [name] Other Clinician: Referring : [name] Treating /Extender: Skipper Cliche in Treatment: 27 Vital Signs Height(in): 58 Pulse(bpm): 105 Weight(lbs): 137 Blood Pressure(mmHg): 155/92 Body Mass Index(BMI): 29 Temperature(F): 98.3 Respiratory Rate(breaths/min): 16 Photos: [N/A:N/A] Wound Location: Left Foot N/A N/A Wounding Event: Pressure Injury N/A N/A Primary Etiology: Pressure Ulcer N/A N/A Comorbid History: Cataracts, Anemia, Coronary Artery N/A N/A Disease, Hypertension, Myocardial Infarction, End Stage Renal Disease, Gout, Osteoarthritis, Dementia Date  Acquired: 10/02/2020 N/A N/A Weeks of Treatment: 13 N/A N/A Wound Status: Open N/A N/A Measurements L x W x D (cm) 0.2x0.2x0.2 N/A N/A Area (cm) : 0.031 N/A N/A Volume (cm) : 0.006 N/A N/A % Reduction in Area: 90.10% N/A N/A % Reduction in Volume: 80.60% N/A N/A Starting Position 1 (o'clock): 12 Ending Position 1 (o'clock): 6 Maximum Distance 1 (cm): 0.3 Undermining: Yes N/A N/A Classification: Category/Stage III N/A N/A Exudate Amount: Medium N/A N/A Exudate Type: Serous N/A N/A Exudate Color: amber N/A N/A Wound Margin: Thickened N/A N/A Granulation Amount: Medium (34-66%) N/A N/A Granulation Quality: Red, Pink N/A N/A Necrotic Amount: Medium (34-66%) N/A N/A Exposed Structures: Fat Layer (Subcutaneous Tissue): N/A N/A Yes Fascia: No Tendon: No Muscle: No Joint: No Bone: No Epithelialization: None N/A N/A Treatment Notes Electronic Signature(s) Hott, Shaela L. ([id number]) Signed: 01/07/2021 10:03:00 AM  By: Donnamarie Poag Entered ByDonnamarie Poag on 01/04/2021 14:26:58 Otter, Brionne L. ([id number]) -------------------------------------------------------------------------------- [place] Details Patient Name: Graber, Clio L. Date of Service: 01/04/2021 1:45 PM Medical Record Number: [id number] Patient Account Number: [id number] Date of Birth/Sex: 16-Jun-1926 (87 y.o. F) Treating RN: Donnamarie Poag Primary Care : [name] Other Clinician: Referring : [name] Treating /Extender: Skipper Cliche in Treatment: 27 Active Inactive Necrotic Tissue Nursing Diagnoses: Impaired tissue integrity related to necrotic/devitalized tissue Knowledge deficit related to management of necrotic/devitalized tissue Goals: Necrotic/devitalized tissue will be minimized in the wound bed Date Initiated: 12/21/2020 Target Resolution Date: 12/21/2020 Goal Status: Active Patient/caregiver will verbalize understanding of reason and  process for debridement of necrotic tissue Date Initiated: 12/21/2020 Date Inactivated: 12/28/2020 Target Resolution Date: 12/21/2020 Goal Status: Met Interventions: Assess patient pain level pre-, during and post procedure and prior to discharge Provide education on necrotic tissue and debridement process Treatment Activities: Excisional debridement : 12/21/2020 Notes: Electronic Signature(s) Signed: 01/07/2021 10:03:00 AM By: Donnamarie Poag Entered ByDonnamarie Poag on 01/04/2021 14:26:41 Pralle, Samreet L. ([id number]) -------------------------------------------------------------------------------- Pain Assessment Details Patient Name: Houpt, Melaya L. Date of Service: 01/04/2021 1:45 PM Medical Record Number: [id number] Patient Account Number: [id number] Date of Birth/Sex: 1926-05-02 (87 y.o. F) Treating RN: Donnamarie Poag Primary Care : [name] Other Clinician: Referring : [name] Treating /Extender: Skipper Cliche in Treatment: 27 Active Problems Location of Pain Severity and Description of Pain Patient Has Paino No Site Locations Rate the pain. Current Pain Level: 0 Pain Management and Medication Current Pain Management: Notes not at this time Electronic Signature(s) Signed: 01/07/2021 10:03:00 AM By: Donnamarie Poag Entered By: Donnamarie Poag on 01/04/2021 14:13:08 Dunckel, Alfred L. ([id number]) -------------------------------------------------------------------------------- Patient/Caregiver Education Details Patient Name: Mestas, Shalina L. Date of Service: 01/04/2021 1:45 PM Medical Record Number: [id number] Patient Account Number: [id number] Date of Birth/Gender: 01/09/1927 (87 y.o. F) Treating RN: Donnamarie Poag Primary Care Physician: [name] Other Clinician: Referring Physician: [name] Treating Physician/Extender: Skipper Cliche in Treatment: 29 Education Assessment Education Provided To: Patient and  Caregiver Education Topics Provided Basic Hygiene: Wound Debridement: Wound/Skin Impairment: Electronic Signature(s) Signed: 01/07/2021 10:03:00 AM By: Donnamarie Poag Entered ByDonnamarie Poag on 01/04/2021 14:29:52 Wieland, Daysia L. ([id number]) -------------------------------------------------------------------------------- Wound Assessment Details Patient Name: Kanady, Jovonna L. Date of Service: 01/04/2021 1:45 PM Medical Record Number: [id number] Patient Account Number: [id number] Date of Birth/Sex: 02-20-1926 (87 y.o. F) Treating RN: Donnamarie Poag Primary Care : [name] Other Clinician: Referring : [name] Treating /Extender: Skipper Cliche in Treatment: 27 Wound Status Wound Number: 3 Primary Pressure Ulcer Etiology: Wound Location: Left Foot Wound Open Wounding Event: Pressure Injury Status: Date Acquired: 10/02/2020 Comorbid Cataracts, Anemia, Coronary Artery Disease, Weeks Of Treatment: 13 History: Hypertension, Myocardial Infarction, End Stage Renal Clustered Wound: No Disease, Gout, Osteoarthritis, Dementia Photos Wound Measurements Length: (cm) 0.2 % Red Width: (cm) 0.2 % Red Depth: (cm) 0.2 Epith Area: (cm) 0.031 Tunn Volume: (cm) 0.006 Unde St En Ma uction in Area: 90.1% uction in Volume: 80.6% elialization: None eling: No rmining: Yes arting Position (o'clock): 12 ding Position (o'clock): 6 ximum Distance: (cm) 0.3 Wound Description Classification: Category/Stage III Foul Wound Margin: Thickened Sloug Exudate Amount: Medium Exudate Type: Serous Exudate Color: amber Odor After Cleansing: No h/Fibrino Yes Wound Bed Granulation Amount: Medium (34-66%) Exposed Structure Granulation Quality: Red, Pink Fascia Exposed: No Necrotic Amount: Medium (34-66%) Fat Layer (Subcutaneous Tissue) Exposed: Yes Necrotic Quality: Adherent Slough Tendon Exposed: No Muscle Exposed: No Joint Exposed:  No Bone Exposed:  No Treatment Notes Wound #3 (Foot) Wound Laterality: Left Cleanser Owen, Lizett L. ([id number]) Soap and Water Discharge Instruction: Gently cleanse wound with antibacterial soap, rinse and pat dry prior to dressing wounds Peri-Wound Care Topical Primary Dressing Hydrofera Blue Ready Transfer Foam, 2.5x2.5 (in/in) Discharge Instruction: Apply Hydrofera Blue Ready to wound bed as directed Secondary Dressing Gauze Discharge Instruction: As directed: dry, moistened with saline or moistened with Dakins Solution Secured With Compression Wrap Profore Lite LF 3 Multilayer Compression Stephens Discharge Instruction: Apply 3 multi-layer wrap as prescribed. Compression Stockings Add-Ons Electronic Signature(s) Signed: 01/07/2021 10:03:00 AM By: Donnamarie Poag Entered By: Donnamarie Poag on 01/04/2021 14:15:40 Loftus, Ryann L. ([id number]) -------------------------------------------------------------------------------- Vitals Details Patient Name: Voytko, Britteny L. Date of Service: 01/04/2021 1:45 PM Medical Record Number: [id number] Patient Account Number: [id number] Date of Birth/Sex: 12/07/1926 (87 y.o. F) Treating RN: Donnamarie Poag Primary Care Clarence Dunsmore: [name] Other Clinician: Referring Aarron Wierzbicki: [name] Treating Jaxn Chiquito/Extender: Skipper Cliche in Treatment: 27 Vital Signs Time Taken: 14:08 Temperature (F): 98.3 Height (in): 58 Pulse (bpm): 105 Weight (lbs): 137 Respiratory Rate (breaths/min): 16 Body Mass Index (BMI): 28.6 Blood Pressure (mmHg): 155/92 Reference Range: 80 - 120 mg / dl Electronic Signature(s) Signed: 01/07/2021 10:03:00 AM By: Donnamarie Poag Entered ByDonnamarie Poag on 01/04/2021 14:08:46

## 2021-01-08 ENCOUNTER — Encounter: Payer: PPO | Admitting: Physician Assistant

## 2021-01-08 ENCOUNTER — Other Ambulatory Visit: Payer: Self-pay

## 2021-01-08 DIAGNOSIS — L89893 Pressure ulcer of other site, stage 3: Secondary | ICD-10-CM | POA: Diagnosis not present

## 2021-01-08 NOTE — Progress Notes (Signed)
Plyler, Carole L. ([id number]) Visit Report for 01/08/2021 Arrival Information Details Patient Name: [name] [name], [name] Date of Service: 01/08/2021 2:30 PM Medical Record Number: [id number] Patient Account Number: [id number] Date of Birth/Sex: [birth year] (87 y.o. F) Treating RN: Donnamarie Poag Primary Care Wania Longstreth: [name] Other Clinician: Referring Tanayia Wahlquist: [name] Treating Kayliee Atienza/Extender: Skipper Cliche in Treatment: 28 Visit Information History Since Last Visit Added or deleted any medications: No Patient Arrived: Wheel Chair Had a fall or experienced change in No Arrival Time: 14:47 activities of daily living that may affect Accompanied By: son risk of falls: Transfer Assistance: None Hospitalized since last visit: No Patient Identification Verified: Yes Has Dressing in Place as Prescribed: Yes Secondary Verification Process Completed: Yes Has Compression in Place as Prescribed: Yes Patient Requires Transmission-Based No Pain Present Now: No Precautions: Patient Has Alerts: Yes Patient Alerts: NOT diabetic ABI R1.18 L1.25 06/27/20 Electronic Signature(s) Signed: 01/08/2021 4:19:39 PM By: Donnamarie Poag Entered ByDonnamarie Poag on 01/08/2021 14:47:19 Shular, Glessie L. ([id number]) -------------------------------------------------------------------------------- Clinic Level of Care Assessment Details Patient Name: [name] [name], [name] Date of Service: 01/08/2021 2:30 PM Medical Record Number: [id number] Patient Account Number: [id number] Date of Birth/Sex: Oct 22, 1926 (87 y.o. F) Treating RN: Donnamarie Poag Primary Care Olaoluwa Grieder: [name] Other Clinician: Referring Brinlyn Cena: [name] Treating Kirsty Monjaraz/Extender: Skipper Cliche in Treatment: 28 Clinic Level of Care Assessment Items TOOL 1 Quantity Score []  - Use when EandM and Procedure is performed on INITIAL visit 0 ASSESSMENTS - Nursing Assessment / Reassessment []  - General Physical Exam  (combine w/ comprehensive assessment (listed just below) when performed on new 0 pt. evals) []  - 0 Comprehensive Assessment (HX, ROS, Risk Assessments, Wounds Hx, etc.) ASSESSMENTS - Wound and Skin Assessment / Reassessment []  - Dermatologic / Skin Assessment (not related to wound area) 0 ASSESSMENTS - Ostomy and/or Continence Assessment and Care []  - Incontinence Assessment and Management 0 []  - 0 Ostomy Care Assessment and Management (repouching, etc.) PROCESS - Coordination of Care []  - Simple Patient / Family Education for ongoing care 0 []  - 0 Complex (extensive) Patient / Family Education for ongoing care []  - 0 Staff obtains Programmer, systems, Records, Test Results / Process Orders []  - 0 Staff telephones HHA, Nursing Homes / Clarify orders / etc []  - 0 Routine Transfer to another Facility (non-emergent condition) []  - 0 Routine Hospital Admission (non-emergent condition) []  - 0 New Admissions / Biomedical engineer / Ordering NPWT, Apligraf, etc. []  - 0 Emergency Hospital Admission (emergent condition) PROCESS - Special Needs []  - Pediatric / Minor Patient Management 0 []  - 0 Isolation Patient Management []  - 0 Hearing / Language / Visual special needs []  - 0 Assessment of Community assistance (transportation, D/C planning, etc.) []  - 0 Additional assistance / Altered mentation []  - 0 Support Surface(s) Assessment (bed, cushion, seat, etc.) INTERVENTIONS - Miscellaneous []  - External ear exam 0 []  - 0 Patient Transfer (multiple staff / Civil Service fast streamer / Similar devices) []  - 0 Simple Staple / Suture removal (25 or less) []  - 0 Complex Staple / Suture removal (26 or more) []  - 0 Hypo/Hyperglycemic Management (do not check if billed separately) []  - 0 Ankle / Brachial Index (ABI) - do not check if billed separately Has the patient been seen at the hospital within the last three years: Yes Total Score: 0 Level Of Care: ____ [name] [name], [name] ([id number]) Electronic  Signature(s) Signed: 01/08/2021 4:19:39 PM By: Donnamarie Poag Entered By: Donnamarie Poag on 01/08/2021 15:23:01 [name] [name], [name] [name] ([id number]) --------------------------------------------------------------------------------  Compression Therapy Details Patient Name: [name] [name], [name] Date of Service: 01/08/2021 2:30 PM Medical Record Number: [id number] Patient Account Number: [id number] Date of Birth/Sex: 1926/05/24 (87 y.o. F) Treating RN: Donnamarie Poag Primary Care Betrice Wanat: [name] Other Clinician: Referring Kaliopi Blyden: [name] Treating Liliauna Santoni/Extender: Skipper Cliche in Treatment: 28 Compression Therapy Performed for Wound Assessment: Wound #3 Left Foot Performed By: Clinician Donnamarie Poag, RN Compression Type: Three Layer Post Procedure Diagnosis Same as Pre-procedure Electronic Signature(s) Signed: 01/08/2021 4:19:39 PM By: Donnamarie Poag Entered By: Donnamarie Poag on 01/08/2021 15:08:25 [name] [name], [name] ([id number]) -------------------------------------------------------------------------------- Encounter Discharge Information Details Patient Name: [name] [name], [name] Date of Service: 01/08/2021 2:30 PM Medical Record Number: [id number] Patient Account Number: [id number] Date of Birth/Sex: Aug 20, 1926 (87 y.o. F) Treating RN: Donnamarie Poag Primary Care Solina Heron: [name] Other Clinician: Referring Taylan Marez: [name] Treating Jovan Colligan/Extender: Skipper Cliche in Treatment: 28 Encounter Discharge Information Items Post Procedure Vitals Discharge Condition: Stable Temperature (F): 97.6 Ambulatory Status: Wheelchair Pulse (bpm): 90 Discharge Destination: Home Respiratory Rate (breaths/min): 16 Transportation: Private Auto Blood Pressure (mmHg): 168/78 Accompanied By: son Schedule Follow-up Appointment: Yes Clinical Summary of Care: Electronic Signature(s) Signed: 01/08/2021 4:19:39 PM By: Donnamarie Poag Entered ByDonnamarie Poag on 01/08/2021 15:24:14 Devall,  Shaakira L. ([id number]) -------------------------------------------------------------------------------- Lower Extremity Assessment Details Patient Name: [name] [name], [name] Date of Service: 01/08/2021 2:30 PM Medical Record Number: [id number] Patient Account Number: [id number] Date of Birth/Sex: [birth year] (87 y.o. F) Treating RN: Donnamarie Poag Primary Care Isadora Delorey: [name] Other Clinician: Referring Hansel Devan: [name] Treating Lillyanne Bradburn/Extender: Skipper Cliche in Treatment: 28 Edema Assessment Assessed: [Left: Yes] [Right: No] Edema: [Left: N] [Right: o] Calf Left: Right: Point of Measurement: 32 cm From Medial Instep 32 cm Ankle Left: Right: Point of Measurement: 12 cm From Medial Instep 21.5 cm Vascular Assessment Pulses: Dorsalis Pedis Palpable: [Left:Yes] Electronic Signature(s) Signed: 01/08/2021 4:19:39 PM By: Donnamarie Poag Entered ByDonnamarie Poag on 01/08/2021 14:54:32 Schack, [place] ([id number]) -------------------------------------------------------------------------------- Multi Wound Chart Details Patient Name: [name] [name], [name] Date of Service: 01/08/2021 2:30 PM Medical Record Number: [id number] Patient Account Number: [id number] Date of Birth/Sex: 08-Nov-1926 (87 y.o. F) Treating RN: Donnamarie Poag Primary Care Donyell Ding: [name] Other Clinician: Referring Demetre Monaco: [name] Treating Jordis Repetto/Extender: Skipper Cliche in Treatment: 28 Vital Signs Height(in): 58 Pulse(bpm): 90 Weight(lbs): 137 Blood Pressure(mmHg): 168/78 Body Mass Index(BMI): 29 Temperature(F): 97.6 Respiratory Rate(breaths/min): 16 Photos: [N/A:N/A] Wound Location: Left Foot N/A N/A Wounding Event: Pressure Injury N/A N/A Primary Etiology: Pressure Ulcer N/A N/A Comorbid History: Cataracts, Anemia, Coronary Artery N/A N/A Disease, Hypertension, Myocardial Infarction, End Stage Renal Disease, Gout, Osteoarthritis, Dementia Date Acquired: 10/02/2020 N/A  N/A Weeks of Treatment: 14 N/A N/A Wound Status: Open N/A N/A Measurements L x W x D (cm) 0.2x0.2x0.2 N/A N/A Area (cm) : 0.031 N/A N/A Volume (cm) : 0.006 N/A N/A % Reduction in Area: 90.10% N/A N/A % Reduction in Volume: 80.60% N/A N/A Starting Position 1 (o'clock): 12 Ending Position 1 (o'clock): 6 Maximum Distance 1 (cm): 0.2 Undermining: Yes N/A N/A Classification: Category/Stage III N/A N/A Exudate Amount: Medium N/A N/A Exudate Type: Serous N/A N/A Exudate Color: amber N/A N/A Wound Margin: Thickened N/A N/A Granulation Amount: Large (67-100%) N/A N/A Granulation Quality: Red, Pink N/A N/A Necrotic Amount: Small (1-33%) N/A N/A Exposed Structures: Fat Layer (Subcutaneous Tissue): N/A N/A Yes Fascia: No Tendon: No Muscle: No Joint: No Bone: No Epithelialization: None N/A N/A Treatment Notes Electronic Signature(s) [name] [name], [name] ([id number]) Signed: 01/08/2021 4:19:39 PM By:  Bishop, Joy Entered ByDonnamarie Poag on 01/08/2021 14:55:13 [name] [name], [name] ([id number]) -------------------------------------------------------------------------------- [place] Details Patient Name: [name] [name], [name] Date of Service: 01/08/2021 2:30 PM Medical Record Number: [id number] Patient Account Number: [id number] Date of Birth/Sex: 12-29-1926 (87 y.o. F) Treating RN: Donnamarie Poag Primary Care Seirra Kos: [name] Other Clinician: Referring Deaveon Schoen: [name] Treating Michaelann Gunnoe/Extender: Skipper Cliche in Treatment: 28 Active Inactive Necrotic Tissue Nursing Diagnoses: Impaired tissue integrity related to necrotic/devitalized tissue Knowledge deficit related to management of necrotic/devitalized tissue Goals: Necrotic/devitalized tissue will be minimized in the wound bed Date Initiated: 12/21/2020 Target Resolution Date: 12/21/2020 Goal Status: Active Patient/caregiver will verbalize understanding of reason and process for debridement of  necrotic tissue Date Initiated: 12/21/2020 Date Inactivated: 12/28/2020 Target Resolution Date: 12/21/2020 Goal Status: Met Interventions: Assess patient pain level pre-, during and post procedure and prior to discharge Provide education on necrotic tissue and debridement process Treatment Activities: Excisional debridement : 12/21/2020 Notes: Electronic Signature(s) Signed: 01/08/2021 4:19:39 PM By: Donnamarie Poag Entered ByDonnamarie Poag on 01/08/2021 14:55:03 [place], [place] ([id number]) -------------------------------------------------------------------------------- Pain Assessment Details Patient Name: [name] [name], Anice L. Date of Service: 01/08/2021 2:30 PM Medical Record Number: [id number] Patient Account Number: [id number] Date of Birth/Sex: October 27, 1926 (87 y.o. F) Treating RN: Donnamarie Poag Primary Care Ha Shannahan: [name] Other Clinician: Referring Dorrian Doggett: [name] Treating Antaeus Karel/Extender: Skipper Cliche in Treatment: 28 Active Problems Location of Pain Severity and Description of Pain Patient Has Paino No Site Locations Rate the pain. Current Pain Level: 0 Pain Management and Medication Current Pain Management: Electronic Signature(s) Signed: 01/08/2021 4:19:39 PM By: Donnamarie Poag Entered By: Donnamarie Poag on 01/08/2021 14:48:11 Lambright, [place] ([id number]) -------------------------------------------------------------------------------- Patient/Caregiver Education Details Patient Name: Burger, Brielle L. Date of Service: 01/08/2021 2:30 PM Medical Record Number: [id number] Patient Account Number: [id number] Date of Birth/Gender: 01/23/1927 (87 y.o. F) Treating RN: Donnamarie Poag Primary Care Physician: [name] Other Clinician: Referring Physician: [name] Treating Physician/Extender: Skipper Cliche in Treatment: 28 Education Assessment Education Provided To: Patient and Caregiver Education Topics Provided Wound Debridement: Wound/Skin  Impairment: Electronic Signature(s) Signed: 01/08/2021 4:19:39 PM By: Donnamarie Poag Entered By: Donnamarie Poag on 01/08/2021 15:23:19 Buchanan, Jemimah L. ([id number]) -------------------------------------------------------------------------------- Wound Assessment Details Patient Name: Antonson, Izamar L. Date of Service: 01/08/2021 2:30 PM Medical Record Number: [id number] Patient Account Number: [id number] Date of Birth/Sex: Feb 10, 1927 (87 y.o. F) Treating RN: Donnamarie Poag Primary Care Jynesis Nakamura: [name] Other Clinician: Referring Xitlalic Maslin: [name] Treating Dani Danis/Extender: Skipper Cliche in Treatment: 28 Wound Status Wound Number: 3 Primary Pressure Ulcer Etiology: Wound Location: Left Foot Wound Open Wounding Event: Pressure Injury Status: Date Acquired: 10/02/2020 Comorbid Cataracts, Anemia, Coronary Artery Disease, Weeks Of Treatment: 14 History: Hypertension, Myocardial Infarction, End Stage Renal Clustered Wound: No Disease, Gout, Osteoarthritis, Dementia Photos Wound Measurements Length: (cm) 0.2 % Red Width: (cm) 0.2 % Red Depth: (cm) 0.2 Epith Area: (cm) 0.031 Tunn Volume: (cm) 0.006 Un[address] En Ma uction in Area: 90.1% uction in Volume: 80.6% elialization: None eling: No rmining: Yes arting Position (o'clock): 12 ding Position (o'clock): 6 ximum Distance: (cm) 0.2 Wound Description Classification: Category/Stage III Foul Wound Margin: Thickened Sloug Exudate Amount: Medium Exudate Type: Serous Exudate Color: amber Odor After Cleansing: No h/Fibrino Yes Wound Bed Granulation Amount: Large (67-100%) Exposed Structure Granulation Quality: Red, Pink Fascia Exposed: No Necrotic Amount: Small (1-33%) Fat Layer (Subcutaneous Tissue) Exposed: Yes Necrotic Quality: Adherent Slough Tendon Exposed: No Muscle Exposed: No Joint Exposed: No Bone Exposed: No Treatment Notes Wound #3 (  Foot) Wound Laterality: Left Cleanser Bosak, Sruthi L.  ([id number]) Soap and Water Discharge Instruction: Gently cleanse wound with antibacterial soap, rinse and pat dry prior to dressing wounds Peri-Wound Care Topical Primary Dressing Prisma 4.34 (in) Discharge Instruction: Moisten w/normal saline or sterile water; Cover wound as directed. Do not remove from wound bed. Secondary Dressing ABD Pad 5x9 (in/in) Discharge Instruction: Cover with ABD pad Foam Dressing, 4x4 (in/in) Discharge Instruction: Surround wound with a donut foam and then cover with second foam Secured With Compression Wrap Profore Lite LF 3 Multilayer Compression [place] Discharge Instruction: Apply 3 multi-layer wrap as prescribed. Compression Stockings Add-Ons Electronic Signature(s) Signed: 01/08/2021 4:19:39 PM By: Donnamarie Poag Entered By: Donnamarie Poag on 01/08/2021 14:53:09 Vandyke, Gerline L. ([id number]) -------------------------------------------------------------------------------- Vitals Details Patient Name: Ober, Cheyenna L. Date of Service: 01/08/2021 2:30 PM Medical Record Number: [id number] Patient Account Number: [id number] Date of Birth/Sex: 04-29-1926 (87 y.o. F) Treating RN: Donnamarie Poag Primary Care [place] Giammona: [name] Other Clinician: Referring Deondria Puryear: [name] Treating Madalene Mickler/Extender: Skipper Cliche in Treatment: 28 Vital Signs Time Taken: 14:45 Temperature (F): 97.6 Height (in): 58 Pulse (bpm): 90 Weight (lbs): 137 Respiratory Rate (breaths/min): 16 Body Mass Index (BMI): 28.6 Blood Pressure (mmHg): 168/78 Reference Range: 80 - 120 mg / dl Electronic Signature(s) Signed: 01/08/2021 4:19:39 PM By: Donnamarie Poag Entered ByDonnamarie Poag on 01/08/2021 14:48:02

## 2021-01-08 NOTE — Progress Notes (Addendum)
[name], [name] ([id number]) Visit Report for 01/08/2021 Chief Complaint Document Details Patient Name: [name] [name], [name] Date of Service: 01/08/2021 2:30 PM Medical Record Number: [id number] Patient Account Number: [id number] Date of Birth/Sex: [birth year] (87 y.o. F) Treating RN: Donnamarie Poag Primary Care Provider: [name] Other Clinician: Referring Provider: [name] Treating Provider/Extender: Skipper Cliche in Treatment: 28 Information Obtained from: Patient Chief Complaint Left Heel Pressure Ulcer Electronic Signature(s) Signed: 01/08/2021 3:00:42 PM By: Worthy Keeler PA-C Entered By: Worthy Keeler on 01/08/2021 15:00:42 Bascom, Caila L. ([id number]) -------------------------------------------------------------------------------- Debridement Details Patient Name: [name], [name] Date of Service: 01/08/2021 2:30 PM Medical Record Number: [id number] Patient Account Number: [id number] Date of Birth/Sex: Jan 04, 1927 (87 y.o. F) Treating RN: Donnamarie Poag Primary Care Provider: [name] Other Clinician: Referring Provider: [name] Treating Provider/Extender: Skipper Cliche in Treatment: 28 Debridement Performed for Wound #3 Left Foot Assessment: Performed By: Physician Tommie Sams., PA-C Debridement Type: Debridement Level of Consciousness (Pre- Awake and Alert procedure): Pre-procedure Verification/Time Out Yes - 15:09 Taken: Start Time: 15:09 Pain Control: Lidocaine Total Area Debrided (L x W): 0.4 (cm) x 0.4 (cm) = 0.16 (cm) Tissue and other material Viable, Non-Viable, Callus, Slough, Subcutaneous, Slough debrided: Level: Skin/Subcutaneous Tissue Debridement Description: Excisional Instrument: Curette Bleeding: Minimum Hemostasis Achieved: Pressure Response to Treatment: Procedure was tolerated well Level of Consciousness (Post- Awake and Alert procedure): Post Debridement Measurements of Total Wound Length: (cm)  0.2 Stage: Category/Stage III Width: (cm) 0.2 Depth: (cm) 0.2 Volume: (cm) 0.006 Character of Wound/Ulcer Post Debridement: Improved Post Procedure Diagnosis Same as Pre-procedure Electronic Signature(s) Signed: 01/08/2021 4:19:39 PM By: Donnamarie Poag Signed: 01/08/2021 5:02:50 PM By: Worthy Keeler PA-C Entered By: Donnamarie Poag on 01/08/2021 15:11:35 [name], Soquel ([id number]) -------------------------------------------------------------------------------- HPI Details Patient Name: [name], [name] Date of Service: 01/08/2021 2:30 PM Medical Record Number: [id number] Patient Account Number: [id number] Date of Birth/Sex: 1926/05/13 (87 y.o. F) Treating RN: Donnamarie Poag Primary Care Provider: [name] Other Clinician: Referring Provider: [name] Treating Provider/Extender: Skipper Cliche in Treatment: 28 History of Present Illness HPI Description: 87 year old patient was recently seen by the PCPs office for significant pain right great toe which has been going on since July. She was initially treated with Keflex which she did not complete and after the office visit this time she has been put on doxycycline. at the time of her visit she was found to have a ulcer on the plantar surface of the right great toe and also had a pyogenic granuloma over this area. X-ray of the right foot done 12/29/2014 -- IMPRESSION:Soft-tissue swelling and ulceration right great toe. No underlying bony lytic lesion identified. If osteomyelitis remains of clinical concern MRI can be obtained. Past medical history significant for anemia, chronic kidney disease stage III, obesity, varicose veins, coronary artery disease, gout, history of nicotine addiction given up smoking in 2002, hypertension, status post cardiac catheterization, status post abdominal hysterectomy, cholecystectomy and tonsillectomy. hemoglobin A1c done in August was 5.8 01/22/2015 -- at this stage the [facility] Walking boat was  going to cost them significant amount of money and they want to defer using that at the present time. 01/29/2015 -- she had a podiatry appointment and they have trimmed her toenails. She has not heard back from the vascular office regarding her venous duplex study and I have asked them to call personally so that they can get the appointment soon. 02/06/2015 -- they have made contact with the vascular office and from what I  understand that test has been done but the report is pending. 02/19/2014 -- the vascular test is scheduled for tomorrow Readmission: 06/26/2020 upon evaluation today patient appears for initial evaluation in her clinic that she has been here before in 2016 and has been quite sometime. She did have a fractured hip in February on the 23rd 2022. Subsequently this had to be pinned and she ended up with a pressure injury on her heel following when she was using her foot to help move her around in the bed. Subsequently this has led to the wound that she has been dealing with since that time. She lives at home with her daughter currently she does have dementia. 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 07/03/2020 upon evaluation today patient appears to be doing well with regard to her heel ulcer. She did have arterial studies they appear to be doing excellent it was premature normal across the board with TBI's in the 90s and ABIs well within normal range. Nonetheless there does not appear to be any signs of arterial insufficiency whatsoever. With that being said the patient does have also signs of improvement there is some necrotic tissue in the base of the wound number to try to clear some of that away today I do believe the Iodoflex/Iodosorb is doing well. 5/24; difficult punched out wound on the left medial heel. She has been using Iodoflex 07/17/2020  upon evaluation today patient appears to be doing well at this point in regard to her wound. I do feel like this is a little bit deeper but again that is what is expected as we continue with the Iodoflex I think that this is just going to get deeper until we get to the base of the wound. With that being said I think we are getting much closer to the base of the wound where we can have healthier tissue that we get a be managing here which that will be awesome. In the meantime I am not surprised by what I am seeing and in fact the wound appears to be better as compared to previous findings. 07/24/2020 upon evaluation today patient appears to be doing well with regard to her wound. Overall I am extremely pleased with where things stand today. I do not see any signs of active infection which is great and overall I think that the patient is making good progress. There is good to be some need for sharp debridement today. 07/31/2020 upon evaluation today patient appears to be doing better in regard to her heel ulcer. She has been tolerating the dressing changes without complication. Fortunately there does not appear to be any signs of active infection which is great and overall very pleased in this regard. No fevers, chills, nausea, vomiting, or diarrhea. 08/14/2020 upon evaluation today patient appears to be doing better in regard to her heel ulcer. I am very pleased in that regard. Unfortunately she still has a slight deep tissue injury in regard to the medial portion of her foot over the same area. Unfortunately I think this is something that if were not careful it was can open up into the wound. That is my main concern here based on what I see. Fortunately there does not appear to be any evidence of active infection which is great news and overall very pleased with where things stand at this point. 08/21/2020 upon evaluation today patient  appears to be doing well with regard to her wound. She has been  tolerating the dressing changes without complication. Fortunately there does not appear to be any signs of active infection at this time. No fevers, chills, nausea, vomiting, or diarrhea. I do believe the compression wrap was beneficial for her. 08/28/2020 upon evaluation today patient appears to be doing well with regard to her wounds currently. Fortunately there does not appear to be any signs of active infection at this time. No fevers, chills, nausea, vomiting, or diarrhea. With that being said I think that her leg is doing quite well to be honest. 09/04/2020 upon evaluation today patient appears to be doing well with regard to her wound on the heel. This is showing signs of good epithelial growth although there is probably can it be an indention where this heals that is okay as long as we get it closed. Fortunately I do not see any evidence of infection at this point. 09/13/2020 upon evaluation today patient appears to be doing well with regard to her wounds. She has been tolerating the dressing changes Gillum, Trenace L. ([id number]) without complication. Fortunately he is actually doing extremely well and I think she is making great progress this is measuring smaller. I would recommend that such that we continue with the [facility] likely since he is doing so well. 09/18/2020 upon evaluation today patient appears to be doing well with regard to her heel ulcer. She is making good progress and I am very pleased with what we see today. I think the Hydrofera Blue is still doing excellent. 10/02/2020 upon evaluation today patient's wound actually appears to be showing signs of good improvement in regard to the heel. I am very pleased in this regard. With that being said I do believe that the area which is somewhat been stable and dry is starting to lift up and beginning to clear this away as well that is on the foot. Subsequently I am going to have to perform some debridement here and we did actually  go ahead and allow this as a wound today as before it was just more deep tissue injury and eschar covering now I think it is a little bit more than that to be honest. 10/09/2020 upon evaluation today patient appears to be doing decently well in regard to her wounds. I am actually very pleased with where things stand today. There does not appear to be any signs of active infection which is great news. No fevers, chills, nausea, vomiting, or diarrhea. She is going require some sharp debridement today. 10/16/2020 upon evaluation today patient appears to be doing decently well in regard to her heel as well as the foot. Both are showing signs of good improvement which is great news. In general and extremely pleased with where things stand at this point. She is tolerating the [name] well although this is getting so small in the heel I think collagen may be a better option here based on what I am seeing. With regard to the foot the Iodoflex/Iodosorb is still probably the best method here. 10/26/2020 upon evaluation today patient actually appears to be doing well in some respects today. She has been tolerating the dressing changes without complication and this is great news. The Hydrofera Blue is done well for the heel this actually appears to be healed today. With that being said in regard to the foot I think we are ready to switch to [facility] based on what I see at  this point. 10/30/2020 upon evaluation today patient appears to be doing well with regard to his wound. He has been tolerating the dressing changes without complication. Fortunately there does not appear to be any signs of active infection systemically at this time which is great news and overall very pleased with where things stand today. I do think that she is making progress although it somewhat slow still where showing signs of improvement little by little here. 11/06/2020 upon evaluation today patient appears to be doing decently  well in regard to her wound. We will start and see better overall appearance of the base of the wound which is great news she still continues to have some abnormal fluorescence signals with a MolecuLight DX that will be detailed below. 11/20/2020 upon evaluation today patient's wound actually showing signs of improvement. There is good to be some need for sharp debridement today and that was discussed with the patient. I am going to go ahead and clean this up but I do believe the [facility] is doing a great job. 11/27/2020; the patient had eschar over the original heel wound which I removed with a #3 curette there was nothing open here. The area is on the lateral left foot foot. 12/04/2020 upon evaluation today patient appears to be doing well with regard to her wound. The wound bed actually appeared to be doing well after I remove the dry endoform from the wound bed. This I tracked little bit of fluid but nonetheless underneath this appears to be doing awesome. I am very pleased with where things stand today. No fevers, chills, nausea, vomiting, or diarrhea. 12/11/2020 upon evaluation today patient appears to be doing well with regard to her wound although it keeps getting dry and filled up with the endoform I am not seeing any signs of infection but we are going to have to clean this away in order to try and get things moving in an appropriate direction. I discussed that with patient's daughter today as well. She is in agreement with proceeding as such. 12/21/2020 upon evaluation today patient appears to be doing well with regard to her wound this did not seal off like it was with the collagen but nonetheless also did not really feeling quite as much as I would like to have seen. I do believe that we may need to go ahead and see about doing a debridement today and I really feel like we may want to switch back to the [facility] which I felt like was doing better in the past. Good news is the  base of the wound appears healthy with good granulation tissue. 12/28/2020 upon evaluation patient's wound actually showed signs of doing quite well in regard to her foot. I think that we getting very close to complete resolution and overall I am extremely happy with where things stand today. 01/04/2021 upon evaluation today patient appears to be doing well with regard to her wound on the foot. This is good to require little bit of debridement today but overall seems to be doing quite well. I am actually very pleased with where we stand currently. 01/08/2021 upon evaluation today patient's wound actually showing signs of some improvement although she still has some callus buildup around the edges of the wound unfortunately I think this is continuing to rub despite what we are doing currently. I think that we may need to go ahead and see about switching up the dressing a little bit I Minna recommend collagen and then on  top of the collagen we will get a use some foam double layered cut in a doughnut to try to take pressure off of this area we will see how this does over the next week. Electronic Signature(s) Signed: 01/08/2021 4:35:00 PM By: Worthy Keeler PA-C Entered By: Worthy Keeler on 01/08/2021 16:35:00 Weigold, Chantavia L. ([id number]) -------------------------------------------------------------------------------- Physical Exam Details Patient Name: Nugent, Briyonna L. Date of Service: 01/08/2021 2:30 PM Medical Record Number: [id number] Patient Account Number: [id number] Date of Birth/Sex: [birth year] (87 y.o. F) Treating RN: Donnamarie Poag Primary Care Provider: [name] Other Clinician: Referring Provider: [name] Treating Provider/Extender: Skipper Cliche in Treatment: 30 Constitutional Well-nourished and well-hydrated in no acute distress. Respiratory normal breathing without difficulty. Psychiatric this patient is able to make decisions and demonstrates good insight  into disease process. Alert and Oriented x 3. pleasant and cooperative. Notes Patient's wound bed did require some sharp debridement clear away some of the necrotic debris she tolerated that today without complication postdebridement the wound bed appears to be doing much better. Electronic Signature(s) Signed: 01/08/2021 4:35:21 PM By: Worthy Keeler PA-C Entered By: Worthy Keeler on 01/08/2021 16:35:20 Kavan, Breshay L. ([id number]) -------------------------------------------------------------------------------- Physician Orders Details Patient Name: Feagans, Zamia L. Date of Service: 01/08/2021 2:30 PM Medical Record Number: [id number] Patient Account Number: [id number] Date of Birth/Sex: 07-03-1926 (87 y.o. F) Treating RN: Donnamarie Poag Primary Care Provider: [name] Other Clinician: Referring Provider: [name] Treating Provider/Extender: Skipper Cliche in Treatment: 85 Verbal / Phone Orders: No Diagnosis Coding ICD-10 Coding Code Description [phone] Pressure ulcer of other site, stage 3 I10 Essential (primary) hypertension F01.50 Vascular dementia without behavioral disturbance I25.10 Atherosclerotic heart disease of native coronary artery without angina pectoris Follow-up Appointments o Return Appointment in 1 week. o Nurse Visit as needed Bathing/ Shower/ Hygiene o May shower with wound dressing protected with water repellent cover or cast protector. o No tub bath. Edema Control - Lymphedema / Segmental Compressive Device / Other o Optional: One layer of unna paste to top of compression wrap (to act as an anchor). o Patient to wear own compression stockings. Remove compression stockings every night before going to bed and put on every morning when getting up. - right leg o Elevate legs to the level of the heart and pump ankles as often as possible o Elevate leg(s) parallel to the floor when sitting. o DO YOUR BEST to sleep in the bed at  night. DO NOT sleep in your recliner. Long hours of sitting in a recliner leads to swelling of the legs and/or potential wounds on your backside. Off-Loading o Other: - Offloading boots while in bed; Try pillow under sheet to keep it in place. Wound Treatment Wound #3 - Foot Wound Laterality: Left Cleanser: Soap and Water 1 x Per Week/30 Days Discharge Instructions: Gently cleanse wound with antibacterial soap, rinse and pat dry prior to dressing wounds Primary Dressing: Prisma 4.34 (in) 1 x Per Week/30 Days Discharge Instructions: Moisten w/normal saline or sterile water; Cover wound as directed. Do not remove from wound bed. Secondary Dressing: ABD Pad 5x9 (in/in) 1 x Per Week/30 Days Discharge Instructions: Cover with ABD pad Secondary Dressing: Foam Dressing, 4x4 (in/in) 1 x Per Week/30 Days Discharge Instructions: Surround wound with a donut foam and then cover with second foam Compression Wrap: Profore Lite LF 3 Multilayer Compression Bandaging System 1 x Per Week/30 Days Discharge Instructions: Apply 3 multi-layer wrap as prescribed. Electronic Signature(s) Signed: 01/08/2021 4:19:39 PM By: Lyndel Safe,  Joy Signed: 01/08/2021 5:02:50 PM By: Worthy Keeler PA-C Entered By: Donnamarie Poag on 01/08/2021 15:14:02 Wrinkle, Marny L. ([id number]) -------------------------------------------------------------------------------- Problem List Details Patient Name: Noboa, Kiarah L. Date of Service: 01/08/2021 2:30 PM Medical Record Number: [id number] Patient Account Number: [id number] Date of Birth/Sex: 08/19/1926 (87 y.o. F) Treating RN: Donnamarie Poag Primary Care Provider: [name] Other Clinician: Referring Provider: [name] Treating Provider/Extender: Skipper Cliche in Treatment: 28 Active Problems ICD-10 Encounter Code Description Active Date MDM Diagnosis L89.893 Pressure ulcer of other site, stage 3 10/02/2020 No Yes I10 Essential (primary) hypertension 06/26/2020 No  Yes F01.50 Vascular dementia without behavioral disturbance 06/26/2020 No Yes I25.10 Atherosclerotic heart disease of native coronary artery without angina 06/26/2020 No Yes pectoris Inactive Problems Resolved Problems ICD-10 Code Description Active Date Resolved Date L89.623 Pressure ulcer of left heel, stage 3 06/26/2020 06/26/2020 Electronic Signature(s) Signed: 01/08/2021 3:00:36 PM By: Worthy Keeler PA-C Entered By: Worthy Keeler on 01/08/2021 15:00:36 Litzinger, Evynn L. ([id number]) -------------------------------------------------------------------------------- Progress Note Details Patient Name: Jia, Kashira L. Date of Service: 01/08/2021 2:30 PM Medical Record Number: [id number] Patient Account Number: [id number] Date of Birth/Sex: 11/17/1926 (87 y.o. F) Treating RN: Donnamarie Poag Primary Care Provider: [name] Other Clinician: Referring Provider: [name] Treating Provider/Extender: Skipper Cliche in Treatment: 28 Subjective Chief Complaint Information obtained from Patient Left Heel Pressure Ulcer History of Present Illness (HPI) 87 year old patient was recently seen by the PCPs office for significant pain right great toe which has been going on since July. She was initially treated with Keflex which she did not complete and after the office visit this time she has been put on doxycycline. at the time of her visit she was found to have a ulcer on the plantar surface of the right great toe and also had a pyogenic granuloma over this area. X-ray of the right foot done 12/29/2014 -- IMPRESSION:Soft-tissue swelling and ulceration right great toe. No underlying bony lytic lesion identified. If osteomyelitis remains of clinical concern MRI can be obtained. Past medical history significant for anemia, chronic kidney disease stage III, obesity, varicose veins, coronary artery disease, gout, history of nicotine addiction given up smoking in 2002, hypertension,  status post cardiac catheterization, status post abdominal hysterectomy, cholecystectomy and tonsillectomy. hemoglobin A1c done in August was 5.8 01/22/2015 -- at this stage the [facility] Walking boat was going to cost them significant amount of money and they want to defer using that at the present time. 01/29/2015 -- she had a podiatry appointment and they have trimmed her toenails. She has not heard back from the vascular office regarding her venous duplex study and I have asked them to call personally so that they can get the appointment soon. 02/06/2015 -- they have made contact with the vascular office and from what I understand that test has been done but the report is pending. 02/19/2014 -- the vascular test is scheduled for tomorrow Readmission: 06/26/2020 upon evaluation today patient appears for initial evaluation in her clinic that she has been here before in 2016 and has been quite sometime. She did have a fractured hip in February on the 23rd 2022. Subsequently this had to be pinned and she ended up with a pressure injury on her heel following when she was using her foot to help move her around in the bed. Subsequently this has led to the wound that she has been dealing with since that time. She lives at home with her daughter currently she does have dementia. 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 07/03/2020 upon evaluation today patient appears to be doing well with regard to her heel ulcer. She did have arterial studies they appear to be doing excellent it was premature normal across the board with TBI's in the 90s and ABIs well within normal range. Nonetheless there does not appear to be any signs of arterial insufficiency whatsoever. With that being said the patient does have also signs of improvement there is some necrotic tissue in the base of the  wound number to try to clear some of that away today I do believe the Iodoflex/Iodosorb is doing well. 5/24; difficult punched out wound on the left medial heel. She has been using Iodoflex 07/17/2020 upon evaluation today patient appears to be doing well at this point in regard to her wound. I do feel like this is a little bit deeper but again that is what is expected as we continue with the Iodoflex I think that this is just going to get deeper until we get to the base of the wound. With that being said I think we are getting much closer to the base of the wound where we can have healthier tissue that we get a be managing here which that will be awesome. In the meantime I am not surprised by what I am seeing and in fact the wound appears to be better as compared to previous findings. 07/24/2020 upon evaluation today patient appears to be doing well with regard to her wound. Overall I am extremely pleased with where things stand today. I do not see any signs of active infection which is great and overall I think that the patient is making good progress. There is good to be some need for sharp debridement today. 07/31/2020 upon evaluation today patient appears to be doing better in regard to her heel ulcer. She has been tolerating the dressing changes without complication. Fortunately there does not appear to be any signs of active infection which is great and overall very pleased in this regard. No fevers, chills, nausea, vomiting, or diarrhea. 08/14/2020 upon evaluation today patient appears to be doing better in regard to her heel ulcer. I am very pleased in that regard. Unfortunately she still has a slight deep tissue injury in regard to the medial portion of her foot over the same area. Unfortunately I think this is something that if were not careful it was can open up into the wound. That is my main concern here based on what I see. Fortunately there does not appear to be any evidence of active  infection which is great news and overall very pleased with where things stand at this point. 08/21/2020 upon evaluation today patient appears to be doing well with regard to her wound. She has been tolerating the dressing changes without complication. Fortunately there does not appear to be any signs of active infection at this time. No fevers, chills, nausea, vomiting, or diarrhea. I do believe the compression wrap was beneficial for her. 08/28/2020 upon evaluation today patient appears to be doing well with regard to her wounds currently. Fortunately there does not appear to be any signs of active infection at this time. No fevers, chills, nausea, vomiting, or diarrhea. With that being said I think that her leg is doing quite well to be honest. Forse, Jilliam L. ([id number]) 09/04/2020 upon evaluation today patient appears to be doing well  with regard to her wound on the heel. This is showing signs of good epithelial growth although there is probably can it be an indention where this heals that is okay as long as we get it closed. Fortunately I do not see any evidence of infection at this point. 09/13/2020 upon evaluation today patient appears to be doing well with regard to her wounds. She has been tolerating the dressing changes without complication. Fortunately he is actually doing extremely well and I think she is making great progress this is measuring smaller. I would recommend that such that we continue with the [facility] likely since he is doing so well. 09/18/2020 upon evaluation today patient appears to be doing well with regard to her heel ulcer. She is making good progress and I am very pleased with what we see today. I think the Hydrofera Blue is still doing excellent. 10/02/2020 upon evaluation today patient's wound actually appears to be showing signs of good improvement in regard to the heel. I am very pleased in this regard. With that being said I do believe that the area which is  somewhat been stable and dry is starting to lift up and beginning to clear this away as well that is on the foot. Subsequently I am going to have to perform some debridement here and we did actually go ahead and allow this as a wound today as before it was just more deep tissue injury and eschar covering now I think it is a little bit more than that to be honest. 10/09/2020 upon evaluation today patient appears to be doing decently well in regard to her wounds. I am actually very pleased with where things stand today. There does not appear to be any signs of active infection which is great news. No fevers, chills, nausea, vomiting, or diarrhea. She is going require some sharp debridement today. 10/16/2020 upon evaluation today patient appears to be doing decently well in regard to her heel as well as the foot. Both are showing signs of good improvement which is great news. In general and extremely pleased with where things stand at this point. She is tolerating the [facility] well although this is getting so small in the heel I think collagen may be a better option here based on what I am seeing. With regard to the foot the Iodoflex/Iodosorb is still probably the best method here. 10/26/2020 upon evaluation today patient actually appears to be doing well in some respects today. She has been tolerating the dressing changes without complication and this is great news. The Hydrofera Blue is done well for the heel this actually appears to be healed today. With that being said in regard to the foot I think we are ready to switch to [facility] based on what I see at this point. 10/30/2020 upon evaluation today patient appears to be doing well with regard to his wound. He has been tolerating the dressing changes without complication. Fortunately there does not appear to be any signs of active infection systemically at this time which is great news and overall very pleased with where things stand today. I  do think that she is making progress although it somewhat slow still where showing signs of improvement little by little here. 11/06/2020 upon evaluation today patient appears to be doing decently well in regard to her wound. We will start and see better overall appearance of the base of the wound which is great news she still continues to have some abnormal fluorescence  signals with a MolecuLight DX that will be detailed below. 11/20/2020 upon evaluation today patient's wound actually showing signs of improvement. There is good to be some need for sharp debridement today and that was discussed with the patient. I am going to go ahead and clean this up but I do believe the [facility] is doing a great job. 11/27/2020; the patient had eschar over the original heel wound which I removed with a #3 curette there was nothing open here. The area is on the lateral left foot foot. 12/04/2020 upon evaluation today patient appears to be doing well with regard to her wound. The wound bed actually appeared to be doing well after I remove the dry endoform from the wound bed. This I tracked little bit of fluid but nonetheless underneath this appears to be doing awesome. I am very pleased with where things stand today. No fevers, chills, nausea, vomiting, or diarrhea. 12/11/2020 upon evaluation today patient appears to be doing well with regard to her wound although it keeps getting dry and filled up with the endoform I am not seeing any signs of infection but we are going to have to clean this away in order to try and get things moving in an appropriate direction. I discussed that with patient's daughter today as well. She is in agreement with proceeding as such. 12/21/2020 upon evaluation today patient appears to be doing well with regard to her wound this did not seal off like it was with the collagen but nonetheless also did not really feeling quite as much as I would like to have seen. I do believe that we may  need to go ahead and see about doing a debridement today and I really feel like we may want to switch back to the [facility] which I felt like was doing better in the past. Good news is the base of the wound appears healthy with good granulation tissue. 12/28/2020 upon evaluation patient's wound actually showed signs of doing quite well in regard to her foot. I think that we getting very close to complete resolution and overall I am extremely happy with where things stand today. 01/04/2021 upon evaluation today patient appears to be doing well with regard to her wound on the foot. This is good to require little bit of debridement today but overall seems to be doing quite well. I am actually very pleased with where we stand currently. 01/08/2021 upon evaluation today patient's wound actually showing signs of some improvement although she still has some callus buildup around the edges of the wound unfortunately I think this is continuing to rub despite what we are doing currently. I think that we may need to go ahead and see about switching up the dressing a little bit I Minna recommend collagen and then on top of the collagen we will get a use some foam double layered cut in a doughnut to try to take pressure off of this area we will see how this does over the next week. Objective Daris, Lexus L. ([id number]) Constitutional Well-nourished and well-hydrated in no acute distress. Vitals Time Taken: 2:45 PM, Height: 58 in, Weight: 137 lbs, BMI: 28.6, Temperature: 97.6 F, Pulse: 90 bpm, Respiratory Rate: 16 breaths/min, Blood Pressure: 168/78 mmHg. Respiratory normal breathing without difficulty. Psychiatric this patient is able to make decisions and demonstrates good insight into disease process. Alert and Oriented x 3. pleasant and cooperative. General Notes: Patient's wound bed did require some sharp debridement clear away some of the necrotic  debris she tolerated that today  without complication postdebridement the wound bed appears to be doing much better. Integumentary (Hair, Skin) Wound #3 status is Open. Original cause of wound was Pressure Injury. The date acquired was: 10/02/2020. The wound has been in treatment 14 weeks. The wound is located on the Left Foot. The wound measures 0.2cm length x 0.2cm width x 0.2cm depth; 0.031cm^2 area and 0.006cm^3 volume. There is Fat Layer (Subcutaneous Tissue) exposed. There is no tunneling noted, however, there is undermining starting at 12:00 and ending at 6:00 with a maximum distance of 0.2cm. There is a medium amount of serous drainage noted. The wound margin is thickened. There is large (67-100%) red, pink granulation within the wound bed. There is a small (1-33%) amount of necrotic tissue within the wound bed including Adherent Slough. Assessment Active Problems ICD-10 Pressure ulcer of other site, stage 3 Essential (primary) hypertension Vascular dementia without behavioral disturbance Atherosclerotic heart disease of native coronary artery without angina pectoris Procedures Wound #3 Pre-procedure diagnosis of Wound #3 is a Pressure Ulcer located on the Left Foot . There was a Excisional Skin/Subcutaneous Tissue Debridement with a total area of 0.16 sq cm performed by Tommie Sams., PA-C. With the following instrument(s): Curette to remove Viable and Non-Viable tissue/material. Material removed includes Callus, Subcutaneous Tissue, and Slough after achieving pain control using Lidocaine. A time out was conducted at 15:09, prior to the start of the procedure. A Minimum amount of bleeding was controlled with Pressure. The procedure was tolerated well. Post Debridement Measurements: 0.2cm length x 0.2cm width x 0.2cm depth; 0.006cm^3 volume. Post debridement Stage noted as Category/Stage III. Character of Wound/Ulcer Post Debridement is improved. Post procedure Diagnosis Wound #3: Same as  Pre-Procedure Pre-procedure diagnosis of Wound #3 is a Pressure Ulcer located on the Left Foot . There was a Three Layer Compression Therapy Procedure by Donnamarie Poag, RN. Post procedure Diagnosis Wound #3: Same as Pre-Procedure Plan Follow-up Appointments: Return Appointment in 1 week. Nurse Visit as needed Bathing/ Shower/ Hygiene: May shower with wound dressing protected with water repellent cover or cast protector. No tub bath. Edema Control - Lymphedema / Segmental Compressive Device / Other: Friday, Draven L. ([id number]) Optional: One layer of unna paste to top of compression wrap (to act as an anchor). Patient to wear own compression stockings. Remove compression stockings every night before going to bed and put on every morning when getting up. - right leg Elevate legs to the level of the heart and pump ankles as often as possible Elevate leg(s) parallel to the floor when sitting. DO YOUR BEST to sleep in the bed at night. DO NOT sleep in your recliner. Long hours of sitting in a recliner leads to swelling of the legs and/or potential wounds on your backside. Off-Loading: Other: - Offloading boots while in bed; Try pillow under sheet to keep it in place. WOUND #3: - Foot Wound Laterality: Left Cleanser: Soap and Water 1 x Per Week/30 Days Discharge Instructions: Gently cleanse wound with antibacterial soap, rinse and pat dry prior to dressing wounds Primary Dressing: Prisma 4.34 (in) 1 x Per Week/30 Days Discharge Instructions: Moisten w/normal saline or sterile water; Cover wound as directed. Do not remove from wound bed. Secondary Dressing: ABD Pad 5x9 (in/in) 1 x Per Week/30 Days Discharge Instructions: Cover with ABD pad Secondary Dressing: Foam Dressing, 4x4 (in/in) 1 x Per Week/30 Days Discharge Instructions: Surround wound with a donut foam and then cover with second foam Compression Wrap: Profore Lite  LF 3 Multilayer Compression Bandaging System 1 x Per Week/30  Days Discharge Instructions: Apply 3 multi-layer wrap as prescribed. 1. Would recommend currently that we going continue with the wound care measures as before and the patient is in agreement with plan. This includes the use of the compression wrap which I think is doing a good job. 2. We are going to switch over to collagen however. I am also can recommend that we do a double layer foam doughnut to try to offload this region and hopefully prevent this from causing ongoing issues. 3. I am also can recommend we continue to monitor for any signs of worsening if anything changes she should let me know. All this was discussed with her son who was present with her today as well. We will see patient back for reevaluation in 1 week here in the clinic. If anything worsens or changes patient will contact our office for additional recommendations. Electronic Signature(s) Signed: 01/08/2021 4:36:07 PM By: Worthy Keeler PA-C Entered By: Worthy Keeler on 01/08/2021 16:36:06 Breshears, Ishi L. ([id number]) -------------------------------------------------------------------------------- SuperBill Details Patient Name: Mucci, Tamzin L. Date of Service: 01/08/2021 Medical Record Number: [id number] Patient Account Number: [id number] Date of Birth/Sex: 1927-02-13 (87 y.o. F) Treating RN: Donnamarie Poag Primary Care Provider: [name] Other Clinician: Referring Provider: [name] Treating Provider/Extender: Skipper Cliche in Treatment: 28 Diagnosis Coding ICD-10 Codes Code Description [phone] Pressure ulcer of other site, stage 3 I10 Essential (primary) hypertension F01.50 Vascular dementia without behavioral disturbance I25.10 Atherosclerotic heart disease of native coronary artery without angina pectoris Facility Procedures CPT4 Code: [id number] Description: 13143 - DEB SUBQ TISSUE 20 SQ CM/< Modifier: Quantity: 1 CPT4 Code: Description: ICD-10 Diagnosis Description L89.893 Pressure  ulcer of other site, stage 3 Modifier: Quantity: Physician Procedures CPT4 Code: [id number] Description: 11042 - WC PHYS SUBQ TISS 20 SQ CM Modifier: Quantity: 1 CPT4 Code: Description: ICD-10 Diagnosis Description L89.893 Pressure ulcer of other site, stage 3 Modifier: Quantity: Electronic Signature(s) Signed: 01/08/2021 4:39:14 PM By: Worthy Keeler PA-C Previous Signature: 01/08/2021 4:19:39 PM Version By: Donnamarie Poag Entered By: Worthy Keeler on 01/08/2021 16:39:13

## 2021-01-15 ENCOUNTER — Encounter: Payer: PPO | Admitting: Physician Assistant

## 2021-01-15 ENCOUNTER — Other Ambulatory Visit: Payer: Self-pay

## 2021-01-15 DIAGNOSIS — L89893 Pressure ulcer of other site, stage 3: Secondary | ICD-10-CM | POA: Diagnosis not present

## 2021-01-15 NOTE — Progress Notes (Addendum)
Ragen, Judye L. ([id number]) Visit Report for 01/15/2021 Chief Complaint Document Details Patient Name: Poplin, [name] [name] L. Date of Service: 01/15/2021 11:15 AM Medical Record Number: [id number] Patient Account Number: [id number] Date of Birth/Sex: 03/14/1926 (87 y.o. F) Treating RN: Donnamarie Poag Primary Care Provider: [name] Other Clinician: Referring Provider: [name] Treating Provider/Extender: Skipper Cliche in Treatment: 29 Information Obtained from: Patient Chief Complaint Left Heel Pressure Ulcer Electronic Signature(s) Signed: 01/15/2021 11:51:04 AM By: Worthy Keeler PA-C Entered By: Worthy Keeler on 01/15/2021 11:51:04 Cedrone, Burnie L. ([id number]) -------------------------------------------------------------------------------- Debridement Details Patient Name: Baine, [name] Date of Service: 01/15/2021 11:15 AM Medical Record Number: [id number] Patient Account Number: [id number] Date of Birth/Sex: 15-Sep-1926 (87 y.o. F) Treating RN: Donnamarie Poag Primary Care Provider: [name] Other Clinician: Referring Provider: [name] Treating Provider/Extender: Skipper Cliche in Treatment: 29 Debridement Performed for Wound #3 Left Foot Assessment: Performed By: Physician Tommie Sams., PA-C Debridement Type: Debridement Level of Consciousness (Pre- Awake and Alert procedure): Pre-procedure Verification/Time Out Yes - 11:57 Taken: Start Time: 11:57 Pain Control: Lidocaine Total Area Debrided (L x W): 0.2 (cm) x 0.2 (cm) = 0.04 (cm) Tissue and other material Viable, Non-Viable, Slough, Subcutaneous, Slough debrided: Level: Skin/Subcutaneous Tissue Debridement Description: Excisional Instrument: Curette Bleeding: Minimum Hemostasis Achieved: Pressure Response to Treatment: Procedure was tolerated well Level of Consciousness (Post- Awake and Alert procedure): Post Debridement Measurements of Total Wound Length: (cm)  0.5 Stage: Category/Stage III Width: (cm) 0.4 Depth: (cm) 0.2 Volume: (cm) 0.031 Character of Wound/Ulcer Post Debridement: Improved Post Procedure Diagnosis Same as Pre-procedure Electronic Signature(s) Signed: 01/15/2021 2:55:55 PM By: Donnamarie Poag Signed: 01/15/2021 4:57:43 PM By: Worthy Keeler PA-C Entered By: Donnamarie Poag on 01/15/2021 12:01:49 Errington, Leilah L. ([id number]) -------------------------------------------------------------------------------- HPI Details Patient Name: Citron, [name] Date of Service: 01/15/2021 11:15 AM Medical Record Number: [id number] Patient Account Number: [id number] Date of Birth/Sex: 05/19/1926 (87 y.o. F) Treating RN: Donnamarie Poag Primary Care Provider: [name] Other Clinician: Referring Provider: [name] Treating Provider/Extender: Skipper Cliche in Treatment: 29 History of Present Illness HPI Description: 87 year old patient was recently seen by the PCPs office for significant pain right great toe which has been going on since July. She was initially treated with Keflex which she did not complete and after the office visit this time she has been put on doxycycline. at the time of her visit she was found to have a ulcer on the plantar surface of the right great toe and also had a pyogenic granuloma over this area. X-ray of the right foot done 12/29/2014 -- IMPRESSION:Soft-tissue swelling and ulceration right great toe. No underlying bony lytic lesion identified. If osteomyelitis remains of clinical concern MRI can be obtained. Past medical history significant for anemia, chronic kidney disease stage III, obesity, varicose veins, coronary artery disease, gout, history of nicotine addiction given up smoking in 2002, hypertension, status post cardiac catheterization, status post abdominal hysterectomy, cholecystectomy and tonsillectomy. hemoglobin A1c done in August was 5.8 01/22/2015 -- at this stage the [facility] Walking boat was  going to cost them significant amount of money and they want to defer using that at the present time. 01/29/2015 -- she had a podiatry appointment and they have trimmed her toenails. She has not heard back from the vascular office regarding her venous duplex study and I have asked them to call personally so that they can get the appointment soon. 02/06/2015 -- they have made contact with the vascular office and from what I understand  that test has been done but the report is pending. 02/19/2014 -- the vascular test is scheduled for tomorrow Readmission: 06/26/2020 upon evaluation today patient appears for initial evaluation in her clinic that she has been here before in 2016 and has been quite sometime. She did have a fractured hip in February on the 23rd 2022. Subsequently this had to be pinned and she ended up with a pressure injury on her heel following when she was using her foot to help move her around in the bed. Subsequently this has led to the wound that she has been dealing with since that time. She lives at home with her daughter currently she does have dementia. 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 07/03/2020 upon evaluation today patient appears to be doing well with regard to her heel ulcer. She did have arterial studies they appear to be doing excellent it was premature normal across the board with TBI's in the 90s and ABIs well within normal range. Nonetheless there does not appear to be any signs of arterial insufficiency whatsoever. With that being said the patient does have also signs of improvement there is some necrotic tissue in the base of the wound number to try to clear some of that away today I do believe the Iodoflex/Iodosorb is doing well. 5/24; difficult punched out wound on the left medial heel. She has been using Iodoflex 07/17/2020  upon evaluation today patient appears to be doing well at this point in regard to her wound. I do feel like this is a little bit deeper but again that is what is expected as we continue with the Iodoflex I think that this is just going to get deeper until we get to the base of the wound. With that being said I think we are getting much closer to the base of the wound where we can have healthier tissue that we get a be managing here which that will be awesome. In the meantime I am not surprised by what I am seeing and in fact the wound appears to be better as compared to previous findings. 07/24/2020 upon evaluation today patient appears to be doing well with regard to her wound. Overall I am extremely pleased with where things stand today. I do not see any signs of active infection which is great and overall I think that the patient is making good progress. There is good to be some need for sharp debridement today. 07/31/2020 upon evaluation today patient appears to be doing better in regard to her heel ulcer. She has been tolerating the dressing changes without complication. Fortunately there does not appear to be any signs of active infection which is great and overall very pleased in this regard. No fevers, chills, nausea, vomiting, or diarrhea. 08/14/2020 upon evaluation today patient appears to be doing better in regard to her heel ulcer. I am very pleased in that regard. Unfortunately she still has a slight deep tissue injury in regard to the medial portion of her foot over the same area. Unfortunately I think this is something that if were not careful it was can open up into the wound. That is my main concern here based on what I see. Fortunately there does not appear to be any evidence of active infection which is great news and overall very pleased with where things stand at this point. 08/21/2020 upon evaluation today patient appears  to be doing well with regard to her wound. She has been  tolerating the dressing changes without complication. Fortunately there does not appear to be any signs of active infection at this time. No fevers, chills, nausea, vomiting, or diarrhea. I do believe the compression wrap was beneficial for her. 08/28/2020 upon evaluation today patient appears to be doing well with regard to her wounds currently. Fortunately there does not appear to be any signs of active infection at this time. No fevers, chills, nausea, vomiting, or diarrhea. With that being said I think that her leg is doing quite well to be honest. 09/04/2020 upon evaluation today patient appears to be doing well with regard to her wound on the heel. This is showing signs of good epithelial growth although there is probably can it be an indention where this heals that is okay as long as we get it closed. Fortunately I do not see any evidence of infection at this point. 09/13/2020 upon evaluation today patient appears to be doing well with regard to her wounds. She has been tolerating the dressing changes Watterson, Maurissa L. ([id number]) without complication. Fortunately he is actually doing extremely well and I think she is making great progress this is measuring smaller. I would recommend that such that we continue with the [facility] likely since he is doing so well. 09/18/2020 upon evaluation today patient appears to be doing well with regard to her heel ulcer. She is making good progress and I am very pleased with what we see today. I think the Hydrofera Blue is still doing excellent. 10/02/2020 upon evaluation today patient's wound actually appears to be showing signs of good improvement in regard to the heel. I am very pleased in this regard. With that being said I do believe that the area which is somewhat been stable and dry is starting to lift up and beginning to clear this away as well that is on the foot. Subsequently I am going to have to perform some debridement here and we did actually  go ahead and allow this as a wound today as before it was just more deep tissue injury and eschar covering now I think it is a little bit more than that to be honest. 10/09/2020 upon evaluation today patient appears to be doing decently well in regard to her wounds. I am actually very pleased with where things stand today. There does not appear to be any signs of active infection which is great news. No fevers, chills, nausea, vomiting, or diarrhea. She is going require some sharp debridement today. 10/16/2020 upon evaluation today patient appears to be doing decently well in regard to her heel as well as the foot. Both are showing signs of good improvement which is great news. In general and extremely pleased with where things stand at this point. She is tolerating the [facility] well although this is getting so small in the heel I think collagen may be a better option here based on what I am seeing. With regard to the foot the Iodoflex/Iodosorb is still probably the best method here. 10/26/2020 upon evaluation today patient actually appears to be doing well in some respects today. She has been tolerating the dressing changes without complication and this is great news. The Hydrofera Blue is done well for the heel this actually appears to be healed today. With that being said in regard to the foot I think we are ready to switch to [facility] based on what I see at this  point. 10/30/2020 upon evaluation today patient appears to be doing well with regard to his wound. He has been tolerating the dressing changes without complication. Fortunately there does not appear to be any signs of active infection systemically at this time which is great news and overall very pleased with where things stand today. I do think that she is making progress although it somewhat slow still where showing signs of improvement little by little here. 11/06/2020 upon evaluation today patient appears to be doing decently  well in regard to her wound. We will start and see better overall appearance of the base of the wound which is great news she still continues to have some abnormal fluorescence signals with a MolecuLight DX that will be detailed below. 11/20/2020 upon evaluation today patient's wound actually showing signs of improvement. There is good to be some need for sharp debridement today and that was discussed with the patient. I am going to go ahead and clean this up but I do believe the [facility] is doing a great job. 11/27/2020; the patient had eschar over the original heel wound which I removed with a #3 curette there was nothing open here. The area is on the lateral left foot foot. 12/04/2020 upon evaluation today patient appears to be doing well with regard to her wound. The wound bed actually appeared to be doing well after I remove the dry endoform from the wound bed. This I tracked little bit of fluid but nonetheless underneath this appears to be doing awesome. I am very pleased with where things stand today. No fevers, chills, nausea, vomiting, or diarrhea. 12/11/2020 upon evaluation today patient appears to be doing well with regard to her wound although it keeps getting dry and filled up with the endoform I am not seeing any signs of infection but we are going to have to clean this away in order to try and get things moving in an appropriate direction. I discussed that with patient's daughter today as well. She is in agreement with proceeding as such. 12/21/2020 upon evaluation today patient appears to be doing well with regard to her wound this did not seal off like it was with the collagen but nonetheless also did not really feeling quite as much as I would like to have seen. I do believe that we may need to go ahead and see about doing a debridement today and I really feel like we may want to switch back to the [facility] which I felt like was doing better in the past. Good news is the  base of the wound appears healthy with good granulation tissue. 12/28/2020 upon evaluation patient's wound actually showed signs of doing quite well in regard to her foot. I think that we getting very close to complete resolution and overall I am extremely happy with where things stand today. 01/04/2021 upon evaluation today patient appears to be doing well with regard to her wound on the foot. This is good to require little bit of debridement today but overall seems to be doing quite well. I am actually very pleased with where we stand currently. 01/08/2021 upon evaluation today patient's wound actually showing signs of some improvement although she still has some callus buildup around the edges of the wound unfortunately I think this is continuing to rub despite what we are doing currently. I think that we may need to go ahead and see about switching up the dressing a little bit I Minna recommend collagen and then on top  of the collagen we will get a use some foam double layered cut in a doughnut to try to take pressure off of this area we will see how this does over the next week. 01/15/2021 upon evaluation today patient's wound is actually showing signs of being completely dried over with the collagen. This is definitely not what we are looking for. I think that the wound dried out too quickly is the main issue that we are running into at this point and I really think we may need to do something else try to keep that from occurring. Electronic Signature(s) Signed: 01/15/2021 1:24:09 PM By: Worthy Keeler PA-C Entered By: Worthy Keeler on 01/15/2021 13:24:09 Szabo, Benita L. ([id number]) -------------------------------------------------------------------------------- Physical Exam Details Patient Name: Moan, Shondra L. Date of Service: 01/15/2021 11:15 AM Medical Record Number: [id number] Patient Account Number: [id number] Date of Birth/Sex: 09/11/1926 (87 y.o. F) Treating RN: Donnamarie Poag Primary Care Provider: [name] Other Clinician: Referring Provider: [name] Treating Provider/Extender: Skipper Cliche in Treatment: 82 Constitutional Well-nourished and well-hydrated in no acute distress. Respiratory normal breathing without difficulty. Psychiatric this patient is able to make decisions and demonstrates good insight into disease process. Alert and Oriented x 3. pleasant and cooperative. Notes Upon inspection patient's wound bed actually showed signs of fairly good granulation once I got to the base of the wound although there was a slough and filmy layer all buildup and dried with the collagen over the top and again this just really does not seem to be healing appropriately in this way. I think we may need to switch over to maybe using some Xeroform to see if this can be of benefit for the patient. Electronic Signature(s) Signed: 01/15/2021 1:24:40 PM By: Worthy Keeler PA-C Entered By: Worthy Keeler on 01/15/2021 13:24:40 Lyon, Ayelet L. ([id number]) -------------------------------------------------------------------------------- Physician Orders Details Patient Name: Sebesta, Amena L. Date of Service: 01/15/2021 11:15 AM Medical Record Number: [id number] Patient Account Number: [id number] Date of Birth/Sex: 19-Jul-1926 (87 y.o. F) Treating RN: Donnamarie Poag Primary Care Provider: [name] Other Clinician: Referring Provider: [name] Treating Provider/Extender: Skipper Cliche in Treatment: 29 Verbal / Phone Orders: No Diagnosis Coding ICD-10 Coding Code Description [phone] Pressure ulcer of other site, stage 3 I10 Essential (primary) hypertension F01.50 Vascular dementia without behavioral disturbance I25.10 Atherosclerotic heart disease of native coronary artery without angina pectoris Follow-up Appointments o Return Appointment in 1 week. o Nurse Visit as needed Bathing/ Shower/ Hygiene o May shower with wound  dressing protected with water repellent cover or cast protector. o No tub bath. Anesthetic (Use 'Patient Medications' Section for Anesthetic Order Entry) o Lidocaine applied to wound bed Edema Control - Lymphedema / Segmental Compressive Device / Other o Optional: One layer of unna paste to top of compression wrap (to act as an anchor). o Patient to wear own compression stockings. Remove compression stockings every night before going to bed and put on every morning when getting up. - right leg o Elevate legs to the level of the heart and pump ankles as often as possible o Elevate leg(s) parallel to the floor when sitting. o DO YOUR BEST to sleep in the bed at night. DO NOT sleep in your recliner. Long hours of sitting in a recliner leads to swelling of the legs and/or potential wounds on your backside. Off-Loading o Other: - Offloading boots while in bed; Try pillow under sheet to keep it in place. Additional Orders / Instructions o Follow Nutritious Diet and  Increase Protein Intake Wound Treatment Wound #3 - Foot Wound Laterality: Left Cleanser: Soap and Water 1 x Per Week/30 Days Discharge Instructions: Gently cleanse wound with antibacterial soap, rinse and pat dry prior to dressing wounds Primary Dressing: Xeroform-HBD 2x2 (in/in) 1 x Per Week/30 Days Discharge Instructions: Inside and over wound-Apply Xeroform-HBD 2x2 (in/in) as directed Secondary Dressing: ABD Pad 5x9 (in/in) 1 x Per Week/30 Days Discharge Instructions: Cover with ABD pad Compression Wrap: Profore Lite LF 3 Multilayer Compression Bandaging System 1 x Per Week/30 Days Discharge Instructions: Apply 3 multi-layer wrap as prescribed. Electronic Signature(s) Signed: 01/15/2021 2:55:55 PM By: Donnamarie Poag Signed: 01/15/2021 4:57:43 PM By: Worthy Keeler PA-C Entered By: Donnamarie Poag on 01/15/2021 12:03:31 Leer, Elderton ([id number]) Montellano, Gladyes L.  ([id number]) -------------------------------------------------------------------------------- Problem List Details Patient Name: Nolde, [name] Date of Service: 01/15/2021 11:15 AM Medical Record Number: [id number] Patient Account Number: [id number] Date of Birth/Sex: 30-Apr-1926 (87 y.o. F) Treating RN: Donnamarie Poag Primary Care Provider: [name] Other Clinician: Referring Provider: [name] Treating Provider/Extender: Skipper Cliche in Treatment: 29 Active Problems ICD-10 Encounter Code Description Active Date MDM Diagnosis L89.893 Pressure ulcer of other site, stage 3 10/02/2020 No Yes I10 Essential (primary) hypertension 06/26/2020 No Yes F01.50 Vascular dementia without behavioral disturbance 06/26/2020 No Yes I25.10 Atherosclerotic heart disease of native coronary artery without angina 06/26/2020 No Yes pectoris Inactive Problems Resolved Problems ICD-10 Code Description Active Date Resolved Date L89.623 Pressure ulcer of left heel, stage 3 06/26/2020 06/26/2020 Electronic Signature(s) Signed: 01/15/2021 11:50:47 AM By: Worthy Keeler PA-C Entered By: Worthy Keeler on 01/15/2021 11:50:47 Royer, Azarria L. ([id number]) -------------------------------------------------------------------------------- Progress Note Details Patient Name: Khurana, [name] Date of Service: 01/15/2021 11:15 AM Medical Record Number: [id number] Patient Account Number: [id number] Date of Birth/Sex: Nov 03, 1926 (87 y.o. F) Treating RN: Donnamarie Poag Primary Care Provider: [name] Other Clinician: Referring Provider: [name] Treating Provider/Extender: Skipper Cliche in Treatment: 29 Subjective Chief Complaint Information obtained from Patient Left Heel Pressure Ulcer History of Present Illness (HPI) 87 year old patient was recently seen by the PCPs office for significant pain right great toe which has been going on since July. She was initially treated with Keflex  which she did not complete and after the office visit this time she has been put on doxycycline. at the time of her visit she was found to have a ulcer on the plantar surface of the right great toe and also had a pyogenic granuloma over this area. X-ray of the right foot done 12/29/2014 -- IMPRESSION:Soft-tissue swelling and ulceration right great toe. No underlying bony lytic lesion identified. If osteomyelitis remains of clinical concern MRI can be obtained. Past medical history significant for anemia, chronic kidney disease stage III, obesity, varicose veins, coronary artery disease, gout, history of nicotine addiction given up smoking in 2002, hypertension, status post cardiac catheterization, status post abdominal hysterectomy, cholecystectomy and tonsillectomy. hemoglobin A1c done in August was 5.8 01/22/2015 -- at this stage the [facility] Walking boat was going to cost them significant amount of money and they want to defer using that at the present time. 01/29/2015 -- she had a podiatry appointment and they have trimmed her toenails. She has not heard back from the vascular office regarding her venous duplex study and I have asked them to call personally so that they can get the appointment soon. 02/06/2015 -- they have made contact with the vascular office and from what I understand that test has been done but the report is  pending. 02/19/2014 -- the vascular test is scheduled for tomorrow Readmission: 06/26/2020 upon evaluation today patient appears for initial evaluation in her clinic that she has been here before in 2016 and has been quite sometime. She did have a fractured hip in February on the 23rd 2022. Subsequently this had to be pinned and she ended up with a pressure injury on her heel following when she was using her foot to help move her around in the bed. Subsequently this has led to the wound that she has been dealing with since that time. She lives at home with her daughter  currently she does have dementia. 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 07/03/2020 upon evaluation today patient appears to be doing well with regard to her heel ulcer. She did have arterial studies they appear to be doing excellent it was premature normal across the board with TBI's in the 90s and ABIs well within normal range. Nonetheless there does not appear to be any signs of arterial insufficiency whatsoever. With that being said the patient does have also signs of improvement there is some necrotic tissue in the base of the wound number to try to clear some of that away today I do believe the Iodoflex/Iodosorb is doing well. 5/24; difficult punched out wound on the left medial heel. She has been using Iodoflex 07/17/2020 upon evaluation today patient appears to be doing well at this point in regard to her wound. I do feel like this is a little bit deeper but again that is what is expected as we continue with the Iodoflex I think that this is just going to get deeper until we get to the base of the wound. With that being said I think we are getting much closer to the base of the wound where we can have healthier tissue that we get a be managing here which that will be awesome. In the meantime I am not surprised by what I am seeing and in fact the wound appears to be better as compared to previous findings. 07/24/2020 upon evaluation today patient appears to be doing well with regard to her wound. Overall I am extremely pleased with where things stand today. I do not see any signs of active infection which is great and overall I think that the patient is making good progress. There is good to be some need for sharp debridement today. 07/31/2020 upon evaluation today patient appears to be doing better in regard to her heel ulcer. She has been tolerating the dressing  changes without complication. Fortunately there does not appear to be any signs of active infection which is great and overall very pleased in this regard. No fevers, chills, nausea, vomiting, or diarrhea. 08/14/2020 upon evaluation today patient appears to be doing better in regard to her heel ulcer. I am very pleased in that regard. Unfortunately she still has a slight deep tissue injury in regard to the medial portion of her foot over the same area. Unfortunately I think this is something that if were not careful it was can open up into the wound. That is my main concern here based on what I see. Fortunately there does not appear to be any evidence of active infection which is great news and overall very pleased with where things stand at this point. 08/21/2020 upon evaluation today patient appears to be doing well with regard to her  wound. She has been tolerating the dressing changes without complication. Fortunately there does not appear to be any signs of active infection at this time. No fevers, chills, nausea, vomiting, or diarrhea. I do believe the compression wrap was beneficial for her. 08/28/2020 upon evaluation today patient appears to be doing well with regard to her wounds currently. Fortunately there does not appear to be any signs of active infection at this time. No fevers, chills, nausea, vomiting, or diarrhea. With that being said I think that her leg is doing quite well to be honest. Keysor, Sherriann L. ([id number]) 09/04/2020 upon evaluation today patient appears to be doing well with regard to her wound on the heel. This is showing signs of good epithelial growth although there is probably can it be an indention where this heals that is okay as long as we get it closed. Fortunately I do not see any evidence of infection at this point. 09/13/2020 upon evaluation today patient appears to be doing well with regard to her wounds. She has been tolerating the dressing changes without  complication. Fortunately he is actually doing extremely well and I think she is making great progress this is measuring smaller. I would recommend that such that we continue with the [facility] likely since he is doing so well. 09/18/2020 upon evaluation today patient appears to be doing well with regard to her heel ulcer. She is making good progress and I am very pleased with what we see today. I think the Hydrofera Blue is still doing excellent. 10/02/2020 upon evaluation today patient's wound actually appears to be showing signs of good improvement in regard to the heel. I am very pleased in this regard. With that being said I do believe that the area which is somewhat been stable and dry is starting to lift up and beginning to clear this away as well that is on the foot. Subsequently I am going to have to perform some debridement here and we did actually go ahead and allow this as a wound today as before it was just more deep tissue injury and eschar covering now I think it is a little bit more than that to be honest. 10/09/2020 upon evaluation today patient appears to be doing decently well in regard to her wounds. I am actually very pleased with where things stand today. There does not appear to be any signs of active infection which is great news. No fevers, chills, nausea, vomiting, or diarrhea. She is going require some sharp debridement today. 10/16/2020 upon evaluation today patient appears to be doing decently well in regard to her heel as well as the foot. Both are showing signs of good improvement which is great news. In general and extremely pleased with where things stand at this point. She is tolerating the [facility] well although this is getting so small in the heel I think collagen may be a better option here based on what I am seeing. With regard to the foot the Iodoflex/Iodosorb is still probably the best method here. 10/26/2020 upon evaluation today patient actually appears  to be doing well in some respects today. She has been tolerating the dressing changes without complication and this is great news. The Hydrofera Blue is done well for the heel this actually appears to be healed today. With that being said in regard to the foot I think we are ready to switch to [facility] based on what I see at this point. 10/30/2020 upon evaluation today patient appears to  be doing well with regard to his wound. He has been tolerating the dressing changes without complication. Fortunately there does not appear to be any signs of active infection systemically at this time which is great news and overall very pleased with where things stand today. I do think that she is making progress although it somewhat slow still where showing signs of improvement little by little here. 11/06/2020 upon evaluation today patient appears to be doing decently well in regard to her wound. We will start and see better overall appearance of the base of the wound which is great news she still continues to have some abnormal fluorescence signals with a MolecuLight DX that will be detailed below. 11/20/2020 upon evaluation today patient's wound actually showing signs of improvement. There is good to be some need for sharp debridement today and that was discussed with the patient. I am going to go ahead and clean this up but I do believe the [facility] is doing a great job. 11/27/2020; the patient had eschar over the original heel wound which I removed with a #3 curette there was nothing open here. The area is on the lateral left foot foot. 12/04/2020 upon evaluation today patient appears to be doing well with regard to her wound. The wound bed actually appeared to be doing well after I remove the dry endoform from the wound bed. This I tracked little bit of fluid but nonetheless underneath this appears to be doing awesome. I am very pleased with where things stand today. No fevers, chills, nausea,  vomiting, or diarrhea. 12/11/2020 upon evaluation today patient appears to be doing well with regard to her wound although it keeps getting dry and filled up with the endoform I am not seeing any signs of infection but we are going to have to clean this away in order to try and get things moving in an appropriate direction. I discussed that with patient's daughter today as well. She is in agreement with proceeding as such. 12/21/2020 upon evaluation today patient appears to be doing well with regard to her wound this did not seal off like it was with the collagen but nonetheless also did not really feeling quite as much as I would like to have seen. I do believe that we may need to go ahead and see about doing a debridement today and I really feel like we may want to switch back to the [facility] which I felt like was doing better in the past. Good news is the base of the wound appears healthy with good granulation tissue. 12/28/2020 upon evaluation patient's wound actually showed signs of doing quite well in regard to her foot. I think that we getting very close to complete resolution and overall I am extremely happy with where things stand today. 01/04/2021 upon evaluation today patient appears to be doing well with regard to her wound on the foot. This is good to require little bit of debridement today but overall seems to be doing quite well. I am actually very pleased with where we stand currently. 01/08/2021 upon evaluation today patient's wound actually showing signs of some improvement although she still has some callus buildup around the edges of the wound unfortunately I think this is continuing to rub despite what we are doing currently. I think that we may need to go ahead and see about switching up the dressing a little bit I Minna recommend collagen and then on top of the collagen we will get a use some  foam double layered cut in a doughnut to try to take pressure off of this area we  will see how this does over the next week. 01/15/2021 upon evaluation today patient's wound is actually showing signs of being completely dried over with the collagen. This is definitely not what we are looking for. I think that the wound dried out too quickly is the main issue that we are running into at this point and I really think we may need to do something else try to keep that from occurring. Faulkenberry, Deitra L. ([id number]) Objective Constitutional Well-nourished and well-hydrated in no acute distress. Vitals Time Taken: 11:48 AM, Height: 58 in, Weight: 137 lbs, BMI: 28.6, Temperature: 98.1 F, Pulse: 90 bpm, Respiratory Rate: 18 breaths/min, Blood Pressure: 100/58 mmHg. Respiratory normal breathing without difficulty. Psychiatric this patient is able to make decisions and demonstrates good insight into disease process. Alert and Oriented x 3. pleasant and cooperative. General Notes: Upon inspection patient's wound bed actually showed signs of fairly good granulation once I got to the base of the wound although there was a slough and filmy layer all buildup and dried with the collagen over the top and again this just really does not seem to be healing appropriately in this way. I think we may need to switch over to maybe using some Xeroform to see if this can be of benefit for the patient. Integumentary (Hair, Skin) Wound #3 status is Open. Original cause of wound was Pressure Injury. The date acquired was: 10/02/2020. The wound has been in treatment 15 weeks. The wound is located on the Left Foot. The wound measures 0.1cm length x 0.1cm width x 0.1cm depth; 0.008cm^2 area and 0.001cm^3 volume. There is Fat Layer (Subcutaneous Tissue) exposed. There is no tunneling or undermining noted. There is a medium amount of serous drainage noted. The wound margin is thickened. There is large (67-100%) red, pink granulation within the wound bed. There is a small (1-33%) amount of necrotic tissue  within the wound bed including Adherent Slough. Assessment Active Problems ICD-10 Pressure ulcer of other site, stage 3 Essential (primary) hypertension Vascular dementia without behavioral disturbance Atherosclerotic heart disease of native coronary artery without angina pectoris Procedures Wound #3 Pre-procedure diagnosis of Wound #3 is a Pressure Ulcer located on the Left Foot . There was a Excisional Skin/Subcutaneous Tissue Debridement with a total area of 0.04 sq cm performed by Tommie Sams., PA-C. With the following instrument(s): Curette to remove Viable and Non-Viable tissue/material. Material removed includes Subcutaneous Tissue and Slough and after achieving pain control using Lidocaine. A time out was conducted at 11:57, prior to the start of the procedure. A Minimum amount of bleeding was controlled with Pressure. The procedure was tolerated well. Post Debridement Measurements: 0.5cm length x 0.4cm width x 0.2cm depth; 0.031cm^3 volume. Post debridement Stage noted as Category/Stage III. Character of Wound/Ulcer Post Debridement is improved. Post procedure Diagnosis Wound #3: Same as Pre-Procedure Pre-procedure diagnosis of Wound #3 is a Pressure Ulcer located on the Left Foot . There was a Three Layer Compression Therapy Procedure by Donnamarie Poag, RN. Post procedure Diagnosis Wound #3: Same as Pre-Procedure Plan Follow-up Appointments: Return Appointment in 1 week. Nurse Visit as needed Bathing/ Shower/ Hygiene: May shower with wound dressing protected with water repellent cover or cast protector. Chiappetta, Debby L. ([id number]) No tub bath. Anesthetic (Use 'Patient Medications' Section for Anesthetic Order Entry): Lidocaine applied to wound bed Edema Control - Lymphedema / Segmental Compressive Device / Other: Optional: One  layer of unna paste to top of compression wrap (to act as an anchor). Patient to wear own compression stockings. Remove compression stockings every  night before going to bed and put on every morning when getting up. - right leg Elevate legs to the level of the heart and pump ankles as often as possible Elevate leg(s) parallel to the floor when sitting. DO YOUR BEST to sleep in the bed at night. DO NOT sleep in your recliner. Long hours of sitting in a recliner leads to swelling of the legs and/or potential wounds on your backside. Off-Loading: Other: - Offloading boots while in bed; Try pillow under sheet to keep it in place. Additional Orders / Instructions: Follow Nutritious Diet and Increase Protein Intake WOUND #3: - Foot Wound Laterality: Left Cleanser: Soap and Water 1 x Per Week/30 Days Discharge Instructions: Gently cleanse wound with antibacterial soap, rinse and pat dry prior to dressing wounds Primary Dressing: Xeroform-HBD 2x2 (in/in) 1 x Per Week/30 Days Discharge Instructions: Inside and over wound-Apply Xeroform-HBD 2x2 (in/in) as directed Secondary Dressing: ABD Pad 5x9 (in/in) 1 x Per Week/30 Days Discharge Instructions: Cover with ABD pad Compression Wrap: Profore Lite LF 3 Multilayer Compression Bandaging System 1 x Per Week/30 Days Discharge Instructions: Apply 3 multi-layer wrap as prescribed. 1. Would recommend currently that we going to switch over to Xeroform gauze dressings for the patient and she is in agreement with the plan. 2. I am also can recommend that we go ahead and see where things stand at follow-up in 1 week's time. We will change the wrap out and see where we stand. We will see patient back for reevaluation in 1 week here in the clinic. If anything worsens or changes patient will contact our office for additional recommendations. Electronic Signature(s) Signed: 01/15/2021 1:25:01 PM By: Worthy Keeler PA-C Entered By: Worthy Keeler on 01/15/2021 13:25:00 Hochstetler, Andrey L. ([id number]) -------------------------------------------------------------------------------- SuperBill Details Patient  Name: Mooneyham, [name] Date of Service: 01/15/2021 Medical Record Number: [id number] Patient Account Number: [id number] Date of Birth/Sex: [birth year] (87 y.o. F) Treating RN: Donnamarie Poag Primary Care Provider: [name] Other Clinician: Referring Provider: [name] Treating Provider/Extender: Skipper Cliche in Treatment: 29 Diagnosis Coding ICD-10 Codes Code Description [phone] Pressure ulcer of other site, stage 3 I10 Essential (primary) hypertension F01.50 Vascular dementia without behavioral disturbance I25.10 Atherosclerotic heart disease of native coronary artery without angina pectoris Facility Procedures CPT4 Code: [id number] Description: 67124 - DEB SUBQ TISSUE 20 SQ CM/< Modifier: Quantity: 1 CPT4 Code: Description: ICD-10 Diagnosis Description L89.893 Pressure ulcer of other site, stage 3 Modifier: Quantity: Physician Procedures CPT4 Code: [id number] Description: 11042 - WC PHYS SUBQ TISS 20 SQ CM Modifier: Quantity: 1 CPT4 Code: Description: ICD-10 Diagnosis Description L89.893 Pressure ulcer of other site, stage 3 Modifier: Quantity: Electronic Signature(s) Signed: 01/15/2021 1:25:28 PM By: Worthy Keeler PA-C Entered By: Worthy Keeler on 01/15/2021 13:25:28

## 2021-01-16 NOTE — Progress Notes (Signed)
[name], [name] ([id number]) Visit Report for 01/15/2021 Arrival Information Details Patient Name: [name] [name] [name] [name]. Date of Service: 01/15/2021 11:15 AM Medical Record Number: [id number] Patient Account Number: [id number] Date of Birth/Sex: April 22, 1926 (87 y.o. F) Treating RN: Carlene Coria Primary Care Dottie Vaquerano: [name] Other Clinician: Referring Adrian Dinovo: [name] Treating Jahniyah Revere/Extender: Skipper Cliche in Treatment: 29 Visit Information History Since Last Visit All ordered tests and consults were completed: No Patient Arrived: Wheel Chair Added or deleted any medications: No Arrival Time: 11:44 Any new allergies or adverse reactions: No Accompanied By: son Had a fall or experienced change in No Transfer Assistance: None activities of daily living that may affect Patient Identification Verified: Yes risk of falls: Secondary Verification Process Completed: Yes Signs or symptoms of abuse/neglect since last visito No Patient Requires Transmission-Based No Hospitalized since last visit: No Precautions: Implantable device outside of the clinic excluding No Patient Has Alerts: Yes cellular tissue based products placed in the center Patient Alerts: NOT diabetic since last visit: ABI R1.18 L1.25 Has Dressing in Place as Prescribed: Yes 06/27/20 Has Compression in Place as Prescribed: Yes Pain Present Now: No Electronic Signature(s) Signed: 01/16/2021 2:51:09 PM By: Carlene Coria RN Entered By: Carlene Coria on 01/15/2021 11:48:32 [name] [name] L. ([id number]) -------------------------------------------------------------------------------- Clinic Level of Care Assessment Details Patient Name: [name] [name] L. Date of Service: 01/15/2021 11:15 AM Medical Record Number: [id number] Patient Account Number: [id number] Date of Birth/Sex: Apr 19, 1926 (87 y.o. F) Treating RN: Donnamarie Poag Primary Care Xitlaly Ault: [name] Other Clinician: Referring Aerial Dilley:  [name] Treating Jameyah Fennewald/Extender: Skipper Cliche in Treatment: 29 Clinic Level of Care Assessment Items TOOL 1 Quantity Score []  - Use when EandM and Procedure is performed on INITIAL visit 0 ASSESSMENTS - Nursing Assessment / Reassessment []  - General Physical Exam (combine w/ comprehensive assessment (listed just below) when performed on new 0 pt. evals) []  - 0 Comprehensive Assessment (HX, ROS, Risk Assessments, Wounds Hx, etc.) ASSESSMENTS - Wound and Skin Assessment / Reassessment []  - Dermatologic / Skin Assessment (not related to wound area) 0 ASSESSMENTS - Ostomy and/or Continence Assessment and Care []  - Incontinence Assessment and Management 0 []  - 0 Ostomy Care Assessment and Management (repouching, etc.) PROCESS - Coordination of Care []  - Simple Patient / Family Education for ongoing care 0 []  - 0 Complex (extensive) Patient / Family Education for ongoing care []  - 0 Staff obtains Programmer, systems, Records, Test Results / Process Orders []  - 0 Staff telephones HHA, Nursing Homes / Clarify orders / etc []  - 0 Routine Transfer to another Facility (non-emergent condition) []  - 0 Routine Hospital Admission (non-emergent condition) []  - 0 New Admissions / Biomedical engineer / Ordering NPWT, Apligraf, etc. []  - 0 Emergency Hospital Admission (emergent condition) PROCESS - Special Needs []  - Pediatric / Minor Patient Management 0 []  - 0 Isolation Patient Management []  - 0 Hearing / Language / Visual special needs []  - 0 Assessment of Community assistance (transportation, D/C planning, etc.) []  - 0 Additional assistance / Altered mentation []  - 0 Support Surface(s) Assessment (bed, cushion, seat, etc.) INTERVENTIONS - Miscellaneous []  - External ear exam 0 []  - 0 Patient Transfer (multiple staff / Civil Service fast streamer / Similar devices) []  - 0 Simple Staple / Suture removal (25 or less) []  - 0 Complex Staple / Suture removal (26 or more) []  -  0 Hypo/Hyperglycemic Management (do not check if billed separately) []  - 0 Ankle / Brachial Index (ABI) - do not check if billed separately Has the  patient been seen at the hospital within the last three years: Yes Total Score: 0 Level Of Care: ____ [name], [name] ([id number]) Electronic Signature(s) Signed: 01/15/2021 2:55:55 PM By: Donnamarie Poag Entered By: Donnamarie Poag on 01/15/2021 12:03:40 [name] [name] L. ([id number]) -------------------------------------------------------------------------------- Compression Therapy Details Patient Name: [name], [name] Date of Service: 01/15/2021 11:15 AM Medical Record Number: [id number] Patient Account Number: [id number] Date of Birth/Sex: Jul 09, 1926 (87 y.o. F) Treating RN: Donnamarie Poag Primary Care Marites Nath: [name] Other Clinician: Referring Davanta Meuser: [name] Treating Gurnoor Sloop/Extender: Skipper Cliche in Treatment: 29 Compression Therapy Performed for Wound Assessment: Wound #3 Left Foot Performed By: Clinician Donnamarie Poag, RN Compression Type: Three Layer Post Procedure Diagnosis Same as Pre-procedure Electronic Signature(s) Signed: 01/15/2021 2:55:55 PM By: Donnamarie Poag Entered By: Donnamarie Poag on 01/15/2021 12:02:27 [name] [name] L. ([id number]) -------------------------------------------------------------------------------- Encounter Discharge Information Details Patient Name: [name], [name] Date of Service: 01/15/2021 11:15 AM Medical Record Number: [id number] Patient Account Number: [id number] Date of Birth/Sex: 1926-06-13 (87 y.o. F) Treating RN: Donnamarie Poag Primary Care Roswell Ndiaye: [name] Other Clinician: Referring Leilanee Righetti: [name] Treating Debbie Bellucci/Extender: Skipper Cliche in Treatment: 29 Encounter Discharge Information Items Post Procedure Vitals Discharge Condition: Stable Temperature (F): 98.1 Ambulatory Status: Wheelchair Pulse (bpm): 90 Discharge Destination:  Home Respiratory Rate (breaths/min): 18 Transportation: Private Auto Blood Pressure (mmHg): 100/58 Accompanied By: self Schedule Follow-up Appointment: Yes Clinical Summary of Care: Electronic Signature(s) Signed: 01/15/2021 2:55:55 PM By: Donnamarie Poag Entered ByDonnamarie Poag on 01/15/2021 12:13:12 [name] [name] L. ([id number]) -------------------------------------------------------------------------------- Lower Extremity Assessment Details Patient Name: [name] [name] L. Date of Service: 01/15/2021 11:15 AM Medical Record Number: [id number] Patient Account Number: [id number] Date of Birth/Sex: 1927-01-31 (87 y.o. F) Treating RN: Carlene Coria Primary Care Fay Bagg: [name] Other Clinician: Referring Michaelah Credeur: [name] Treating Magdelena Kinsella/Extender: Skipper Cliche in Treatment: 29 Edema Assessment Assessed: [Left: No] Patrice Paradise: No] [Left: Edema] [Right: :] Calf Left: Right: Point of Measurement: 32 cm From Medial Instep 31 cm Ankle Left: Right: Point of Measurement: 12 cm From Medial Instep 21 cm Electronic Signature(s) Signed: 01/16/2021 2:51:09 PM By: Carlene Coria RN Entered By: Carlene Coria on 01/15/2021 11:56:11 [name] [name] L. ([id number]) -------------------------------------------------------------------------------- Multi Wound Chart Details Patient Name: [name] [name] L. Date of Service: 01/15/2021 11:15 AM Medical Record Number: [id number] Patient Account Number: [id number] Date of Birth/Sex: 12-Feb-1927 (87 y.o. F) Treating RN: Donnamarie Poag Primary Care Roberto Hlavaty: [name] Other Clinician: Referring Ezana Hubbert: [name] Treating Niomi Valent/Extender: Skipper Cliche in Treatment: 29 Vital Signs Height(in): 15 Pulse(bpm): 73 Weight(lbs): 137 Blood Pressure(mmHg): 100/58 Body Mass Index(BMI): 29 Temperature(F): 98.1 Respiratory Rate(breaths/min): 18 Photos: [N/A:N/A] Wound Location: Left Foot N/A N/A Wounding Event: Pressure Injury  N/A N/A Primary Etiology: Pressure Ulcer N/A N/A Comorbid History: Cataracts, Anemia, Coronary Artery N/A N/A Disease, Hypertension, Myocardial Infarction, End Stage Renal Disease, Gout, Osteoarthritis, Dementia Date Acquired: 10/02/2020 N/A N/A Weeks of Treatment: 15 N/A N/A Wound Status: Open N/A N/A Measurements L x W x D (cm) 0.1x0.1x0.1 N/A N/A Area (cm) : 0.008 N/A N/A Volume (cm) : 0.001 N/A N/A % Reduction in Area: 97.50% N/A N/A % Reduction in Volume: 96.80% N/A N/A Classification: Category/Stage III N/A N/A Exudate Amount: Medium N/A N/A Exudate Type: Serous N/A N/A Exudate Color: amber N/A N/A Wound Margin: Thickened N/A N/A Granulation Amount: Large (67-100%) N/A N/A Granulation Quality: Red, Pink N/A N/A Necrotic Amount: Small (1-33%) N/A N/A Exposed Structures: Fat Layer (Subcutaneous Tissue): N/A N/A Yes Fascia: No Tendon: No Muscle: No Joint: No Bone:  No Epithelialization: None N/A N/A Treatment Notes Electronic Signature(s) Signed: 01/15/2021 2:55:55 PM By: Donnamarie Poag Entered By: Donnamarie Poag on 01/15/2021 11:58:44 [name] [name] L. ([id number]) -------------------------------------------------------------------------------- [name] Details Patient Name: Sumida, [name] L. Date of Service: 01/15/2021 11:15 AM Medical Record Number: [id number] Patient Account Number: [id number] Date of Birth/Sex: November 21, 1926 (87 y.o. F) Treating RN: Donnamarie Poag Primary Care Tniya Bowditch: [name] Other Clinician: Referring Latrisha Coiro: [name] Treating Yannis Broce/Extender: Skipper Cliche in Treatment: 29 Active Inactive Electronic Signature(s) Signed: 01/15/2021 2:55:55 PM By: Donnamarie Poag Entered By: Donnamarie Poag on 01/15/2021 11:58:03 [name] [name] L. ([id number]) -------------------------------------------------------------------------------- Pain Assessment Details Patient Name: [name] [name] L. Date of Service: 01/15/2021 11:15  AM Medical Record Number: [id number] Patient Account Number: [id number] Date of Birth/Sex: 1926-11-17 (87 y.o. F) Treating RN: Carlene Coria Primary Care Kleber Crean: [name] Other Clinician: Referring Dina Mobley: [name] Treating Alcee Sipos/Extender: Skipper Cliche in Treatment: 29 Active Problems Location of Pain Severity and Description of Pain Patient Has Paino No Site Locations Pain Management and Medication Current Pain Management: Electronic Signature(s) Signed: 01/16/2021 2:51:09 PM By: Carlene Coria RN Entered By: Carlene Coria on 01/15/2021 11:49:00 Soler, Geniva L. ([id number]) -------------------------------------------------------------------------------- Patient/Caregiver Education Details Patient Name: Wentworth, Gracieann L. Date of Service: 01/15/2021 11:15 AM Medical Record Number: [id number] Patient Account Number: [id number] Date of Birth/Gender: [birth year] (87 y.o. F) Treating RN: Donnamarie Poag Primary Care Physician: [name] Other Clinician: Referring Physician: [name] Treating Physician/Extender: Skipper Cliche in Treatment: 20 Education Assessment Education Provided To: Patient Education Topics Provided Basic Hygiene: Wound Debridement: Wound/Skin Impairment: Electronic Signature(s) Signed: 01/15/2021 2:55:55 PM By: Donnamarie Poag Entered By: Donnamarie Poag on 01/15/2021 12:03:59 Eroh, Genevive L. ([id number]) -------------------------------------------------------------------------------- Wound Assessment Details Patient Name: Pevey, Taviana L. Date of Service: 01/15/2021 11:15 AM Medical Record Number: [id number] Patient Account Number: [id number] Date of Birth/Sex: [birth year] (87 y.o. F) Treating RN: Carlene Coria Primary Care Rendy Lazard: [name] Other Clinician: Referring Retha Bither: [name] Treating Camara Rosander/Extender: Skipper Cliche in Treatment: 29 Wound Status Wound Number: 3 Primary Pressure  Ulcer Etiology: Wound Location: Left Foot Wound Open Wounding Event: Pressure Injury Status: Date Acquired: 10/02/2020 Comorbid Cataracts, Anemia, Coronary Artery Disease, Weeks Of Treatment: 15 History: Hypertension, Myocardial Infarction, End Stage Renal Clustered Wound: No Disease, Gout, Osteoarthritis, Dementia Photos Wound Measurements Length: (cm) 0.1 Width: (cm) 0.1 Depth: (cm) 0.1 Area: (cm) 0.008 Volume: (cm) 0.001 % Reduction in Area: 97.5% % Reduction in Volume: 96.8% Epithelialization: None Tunneling: No Undermining: No Wound Description Classification: Category/Stage III Wound Margin: Thickened Exudate Amount: Medium Exudate Type: Serous Exudate Color: amber Foul Odor After Cleansing: No Slough/Fibrino Yes Wound Bed Granulation Amount: Large (67-100%) Exposed Structure Granulation Quality: Red, Pink Fascia Exposed: No Necrotic Amount: Small (1-33%) Fat Layer (Subcutaneous Tissue) Exposed: Yes Necrotic Quality: Adherent Slough Tendon Exposed: No Muscle Exposed: No Joint Exposed: No Bone Exposed: No Treatment Notes Wound #3 (Foot) Wound Laterality: Left Cleanser Soap and Water Discharge Instruction: Gently cleanse wound with antibacterial soap, rinse and pat dry prior to dressing wounds Schutt, Shalandra L. ([id number]) Peri-Wound Care Topical Primary Dressing Xeroform-HBD 2x2 (in/in) Discharge Instruction: Inside and over wound-Apply Xeroform-HBD 2x2 (in/in) as directed Secondary Dressing ABD Pad 5x9 (in/in) Discharge Instruction: Cover with ABD pad Secured With Compression Wrap Profore Lite LF 3 Multilayer Compression Bandaging System Discharge Instruction: Apply 3 multi-layer wrap as prescribed. Compression Stockings Add-Ons Electronic Signature(s) Signed: 01/16/2021 2:51:09 PM By: Carlene Coria RN Entered By: Carlene Coria on 01/15/2021 11:55:42 Rueda, Cathryn L.  ([id number]) -------------------------------------------------------------------------------- Vitals Details  Patient Name: Langille, Prabhnoor L. Date of Service: 01/15/2021 11:15 AM Medical Record Number: [id number] Patient Account Number: [id number] Date of Birth/Sex: 14-Mar-1926 (87 y.o. F) Treating RN: Carlene Coria Primary Care Phuc Kluttz: [name] Other Clinician: Referring Kaedynce Tapp: [name] Treating Shams Fill/Extender: Skipper Cliche in Treatment: 29 Vital Signs Time Taken: 11:48 Temperature (F): 98.1 Height (in): 58 Pulse (bpm): 90 Weight (lbs): 137 Respiratory Rate (breaths/min): 18 Body Mass Index (BMI): 28.6 Blood Pressure (mmHg): 100/58 Reference Range: 80 - 120 mg / dl Electronic Signature(s) Signed: 01/16/2021 2:51:09 PM By: Carlene Coria RN Entered By: Carlene Coria on 01/15/2021 11:48:49

## 2021-01-22 ENCOUNTER — Other Ambulatory Visit: Payer: Self-pay

## 2021-01-22 ENCOUNTER — Encounter: Payer: PPO | Attending: Physician Assistant | Admitting: Physician Assistant

## 2021-01-22 DIAGNOSIS — E669 Obesity, unspecified: Secondary | ICD-10-CM | POA: Insufficient documentation

## 2021-01-22 DIAGNOSIS — Z87891 Personal history of nicotine dependence: Secondary | ICD-10-CM | POA: Insufficient documentation

## 2021-01-22 DIAGNOSIS — Z6828 Body mass index (BMI) 28.0-28.9, adult: Secondary | ICD-10-CM | POA: Insufficient documentation

## 2021-01-22 DIAGNOSIS — F0150 Vascular dementia without behavioral disturbance: Secondary | ICD-10-CM | POA: Insufficient documentation

## 2021-01-22 DIAGNOSIS — N183 Chronic kidney disease, stage 3 unspecified: Secondary | ICD-10-CM | POA: Insufficient documentation

## 2021-01-22 DIAGNOSIS — L89893 Pressure ulcer of other site, stage 3: Secondary | ICD-10-CM | POA: Insufficient documentation

## 2021-01-22 DIAGNOSIS — I2510 Atherosclerotic heart disease of native coronary artery without angina pectoris: Secondary | ICD-10-CM | POA: Diagnosis not present

## 2021-01-22 DIAGNOSIS — I120 Hypertensive chronic kidney disease with stage 5 chronic kidney disease or end stage renal disease: Secondary | ICD-10-CM | POA: Diagnosis not present

## 2021-01-22 NOTE — Progress Notes (Signed)
Conigliaro, Angeliki L. ([id number]) Visit Report for 01/22/2021 Arrival Information Details Patient Name: [name] [name], [name] Date of Service: 01/22/2021 11:15 AM Medical Record Number: [id number] Patient Account Number: [id number] Date of Birth/Sex: 06-Oct-1926 (87 y.o. F) Treating RN: Donnamarie Poag Primary Care Mackenna Kamer: [name] Other Clinician: Referring Syenna Nazir: [name] Treating Neville Pauls/Extender: Skipper Cliche in Treatment: 27 Visit Information History Since Last Visit Added or deleted any medications: No Patient Arrived: Wheel Chair Had a fall or experienced change in No Arrival Time: 11:33 activities of daily living that may affect Accompanied By: son risk of falls: Transfer Assistance: [employer] Patient Lift Hospitalized since last visit: No Patient Identification Verified: Yes Has Dressing in Place as Prescribed: Yes Secondary Verification Process Completed: Yes Has Compression in Place as Prescribed: Yes Patient Requires Transmission-Based No [name] [name] Present Now: No Precautions: Patient Has Alerts: Yes Patient Alerts: NOT diabetic ABI R1.18 L1.25 06/27/20 Electronic Signature(s) Signed: 01/22/2021 12:02:03 PM By: Donnamarie Poag Entered ByDonnamarie Poag on 01/22/2021 11:50:29 Sage, Kaedynce L. ([id number]) -------------------------------------------------------------------------------- Compression Therapy Details Patient Name: [name] [name], [name] Date of Service: 01/22/2021 11:15 AM Medical Record Number: [id number] Patient Account Number: [id number] Date of Birth/Sex: [birth year] (87 y.o. F) Treating RN: Donnamarie Poag Primary Care Letticia Bhattacharyya: [name] Other Clinician: Referring Kolson Chovanec: [name] Treating Recia Sons/Extender: Skipper Cliche in Treatment: 30 Compression Therapy Performed for Wound Assessment: Wound #3 Left Foot Performed By: Clinician Donnamarie Poag, RN Compression Type: Three Layer Post Procedure Diagnosis Same as Pre-procedure Electronic  Signature(s) Signed: 01/22/2021 12:02:03 PM By: Donnamarie Poag Entered By: Donnamarie Poag on 01/22/2021 11:53:30 Giebel, Edon ([id number]) -------------------------------------------------------------------------------- Encounter Discharge Information Details Patient Name: [name] [name], [name] Date of Service: 01/22/2021 11:15 AM Medical Record Number: [id number] Patient Account Number: [id number] Date of Birth/Sex: November 07, 1926 (87 y.o. F) Treating RN: Donnamarie Poag Primary Care Jerusalen Mateja: [name] Other Clinician: Referring Arsalan Brisbin: [name] Treating Paddy Neis/Extender: Skipper Cliche in Treatment: 30 Encounter Discharge Information Items Post Procedure Vitals Discharge Condition: Stable Temperature (F): 97.9 Ambulatory Status: Wheelchair Pulse (bpm): 84 Discharge Destination: Home Respiratory Rate (breaths/min): 16 Transportation: Private Auto Blood Pressure (mmHg): 137/66 Accompanied By: son Schedule Follow-up Appointment: Yes Clinical Summary of Care: Electronic Signature(s) Signed: 01/22/2021 12:02:03 PM By: Donnamarie Poag Entered ByDonnamarie Poag on 01/22/2021 11:56:08 Pfeifle, [place] ([id number]) -------------------------------------------------------------------------------- Lower Extremity Assessment Details Patient Name: [name] [name], [name] Date of Service: 01/22/2021 11:15 AM Medical Record Number: [id number] Patient Account Number: [id number] Date of Birth/Sex: Apr 15, 1926 (87 y.o. F) Treating RN: Donnamarie Poag Primary Care Arlynn Stare: [name] Other Clinician: Referring Afton Mikelson: [name] Treating Daylene Vandenbosch/Extender: Skipper Cliche in Treatment: 30 Edema Assessment Assessed: [Left: Yes] Patrice Paradise: No] [Left: Edema] [Right: :] Calf Left: Right: Point of Measurement: 32 cm From Medial Instep 30 cm Ankle Left: Right: Point of Measurement: 12 cm From Medial Instep 21 cm Vascular Assessment Pulses: Dorsalis Pedis Palpable: [Left:Yes] Electronic  Signature(s) Signed: 01/22/2021 12:02:03 PM By: Donnamarie Poag Entered ByDonnamarie Poag on 01/22/2021 11:52:19 Trudo, [place] ([id number]) -------------------------------------------------------------------------------- Multi Wound Chart Details Patient Name: [name] [name], [name] Date of Service: 01/22/2021 11:15 AM Medical Record Number: [id number] Patient Account Number: [id number] Date of Birth/Sex: 05/20/1926 (87 y.o. F) Treating RN: Donnamarie Poag Primary Care Placido Hangartner: [name] Other Clinician: Referring Colbi Staubs: [name] Treating Gwenyth Dingee/Extender: Skipper Cliche in Treatment: 30 Vital Signs Height(in): 58 Pulse(bpm): 84 Weight(lbs): 137 Blood Pressure(mmHg): 137/66 Body Mass Index(BMI): 29 Temperature(F): 97.9 Respiratory Rate(breaths/min): 16 Photos: [N/A:N/A] Wound Location: Left Foot N/A N/A Wounding Event: Pressure Injury N/A N/A Primary  Etiology: Pressure Ulcer N/A N/A Comorbid History: Cataracts, Anemia, Coronary Artery N/A N/A Disease, Hypertension, Myocardial Infarction, End Stage Renal Disease, Gout, Osteoarthritis, Dementia Date Acquired: 10/02/2020 N/A N/A Weeks of Treatment: 16 N/A N/A Wound Status: Open N/A N/A Measurements L x W x D (cm) 0.2x0.2x0.2 N/A N/A Area (cm) : 0.031 N/A N/A Volume (cm) : 0.006 N/A N/A % Reduction in Area: 90.10% N/A N/A % Reduction in Volume: 80.60% N/A N/A Starting Position 1 (o'clock): 11 Ending Position 1 (o'clock): 12 Maximum Distance 1 (cm): 0.1 Undermining: Yes N/A N/A Classification: Category/Stage III N/A N/A Exudate Amount: Medium N/A N/A Exudate Type: Serous N/A N/A Exudate Color: amber N/A N/A Wound Margin: Thickened N/A N/A Granulation Amount: Large (67-100%) N/A N/A Granulation Quality: Red, Pink N/A N/A Necrotic Amount: Small (1-33%) N/A N/A Exposed Structures: Fat Layer (Subcutaneous Tissue): N/A N/A Yes Fascia: No Tendon: No Muscle: No Joint: No Bone: No Epithelialization: None N/A  N/A Treatment Notes Electronic Signature(s) [name] [name], [name] ([id number]) Signed: 01/22/2021 12:02:03 PM By: Donnamarie Poag Entered ByDonnamarie Poag on 01/22/2021 11:53:07 [name] [name], [name] ([id number]) -------------------------------------------------------------------------------- [place] Details Patient Name: [name] [name], [name] Date of Service: 01/22/2021 11:15 AM Medical Record Number: [id number] Patient Account Number: [id number] Date of Birth/Sex: 06/02/1926 (87 y.o. F) Treating RN: Donnamarie Poag Primary Care Aviraj Kentner: [name] Other Clinician: Referring Jhonatan Lomeli: [name] Treating Jalyssa Fleisher/Extender: Skipper Cliche in Treatment: 30 Active Inactive Electronic Signature(s) Signed: 01/22/2021 12:02:03 PM By: Donnamarie Poag Entered ByDonnamarie Poag on 01/22/2021 11:52:26 [name] [name], [name] ([id number]) -------------------------------------------------------------------------------- [name] [name] Assessment Details Patient Name: [name] [name], [name] Date of Service: 01/22/2021 11:15 AM Medical Record Number: [id number] Patient Account Number: [id number] Date of Birth/Sex: 06-Nov-1926 (87 y.o. F) Treating RN: Donnamarie Poag Primary Care Mishti Swanton: [name] Other Clinician: Referring Saadiq Poche: [name] Treating Faun Mcqueen/Extender: Skipper Cliche in Treatment: 30 Active Problems Location of [name] [name] Severity and Description of [name] [name] Patient Has Paino No Site Locations Rate the [name] Current [name] [name] Level: 0 [name] [name] Management and Medication Current [name] [name] Management: Electronic Signature(s) Signed: 01/22/2021 12:02:03 PM By: Donnamarie Poag Entered By: Donnamarie Poag on 01/22/2021 11:51:10 Sabado, Nashya L. ([id number]) -------------------------------------------------------------------------------- Patient/Caregiver Education Details Patient Name: [name] [name], [name] Date of Service: 01/22/2021 11:15 AM Medical Record Number: [id number] Patient Account Number:  [id number] Date of Birth/Gender: 28-Mar-1926 (87 y.o. F) Treating RN: Donnamarie Poag Primary Care Physician: [name] Other Clinician: Referring Physician: [name] Treating Physician/Extender: Skipper Cliche in Treatment: 30 Education Assessment Education Provided To: Patient Education Topics Provided Wound Debridement: Wound/Skin Impairment: Electronic Signature(s) Signed: 01/22/2021 12:02:03 PM By: Donnamarie Poag Entered ByDonnamarie Poag on 01/22/2021 11:55:24 [name] [name], [name] ([id number]) -------------------------------------------------------------------------------- Wound Assessment Details Patient Name: [name] [name], Alexarae L. Date of Service: 01/22/2021 11:15 AM Medical Record Number: [id number] Patient Account Number: [id number] Date of Birth/Sex: 1926-08-26 (87 y.o. F) Treating RN: Donnamarie Poag Primary Care Mailey Landstrom: [name] Other Clinician: Referring Ambrosio Reuter: [name] Treating Lakeshia Dohner/Extender: Skipper Cliche in Treatment: 30 Wound Status Wound Number: 3 Primary Pressure Ulcer Etiology: Wound Location: Left Foot Wound Open Wounding Event: Pressure Injury Status: Date Acquired: 10/02/2020 Comorbid Cataracts, Anemia, Coronary Artery Disease, Weeks Of Treatment: 16 History: Hypertension, Myocardial Infarction, End Stage Renal Clustered Wound: No Disease, Gout, Osteoarthritis, Dementia Photos Wound Measurements Length: (cm) 0.2 % Red Width: (cm) 0.2 % Red Depth: (cm) 0.2 Epith Area: (cm) 0.031 Tunn Volume: (cm) 0.006 [address] En Ma uction in Area: 90.1% uction in Volume: 80.6% elialization: None eling: No rmining: Yes arting Position (o'clock): 11  ding Position (o'clock): 12 ximum Distance: (cm) 0.1 Wound Description Classification: Category/Stage III Foul Wound Margin: Thickened Sloug Exudate Amount: Medium Exudate Type: Serous Exudate Color: amber Odor After Cleansing: No h/Fibrino Yes Wound Bed Granulation Amount: Large  (67-100%) Exposed Structure Granulation Quality: Red, Pink Fascia Exposed: No Necrotic Amount: Small (1-33%) Fat Layer (Subcutaneous Tissue) Exposed: Yes Necrotic Quality: Adherent Slough Tendon Exposed: No Muscle Exposed: No Joint Exposed: No Bone Exposed: No Treatment Notes Wound #3 (Foot) Wound Laterality: Left Cleanser Gemmill, Phillip L. ([id number]) Soap and Water Discharge Instruction: Gently cleanse wound with antibacterial soap, rinse and pat dry prior to dressing wounds Peri-Wound Care Topical Primary Dressing Xeroform-HBD 2x2 (in/in) Discharge Instruction: Inside and over wound-Apply Xeroform-HBD 2x2 (in/in) as directed Secondary Dressing ABD Pad 5x9 (in/in) Discharge Instruction: Cover with ABD pad Secured With Compression Wrap Profore Lite LF 3 Multilayer Compression [place] Discharge Instruction: Apply 3 multi-layer wrap as prescribed. Compression Stockings Add-Ons Electronic Signature(s) Signed: 01/22/2021 12:02:03 PM By: Donnamarie Poag Entered By: Donnamarie Poag on 01/22/2021 11:51:57 Hodgkiss, Rimsha L. ([id number]) -------------------------------------------------------------------------------- Vitals Details Patient Name: Sagrero, Catrinia L. Date of Service: 01/22/2021 11:15 AM Medical Record Number: [id number] Patient Account Number: [id number] Date of Birth/Sex: February 27, 1926 (87 y.o. F) Treating RN: Donnamarie Poag Primary Care Voyd Groft: [name] Other Clinician: Referring Alvin Rubano: [name] Treating Jun Osment/Extender: Skipper Cliche in Treatment: 30 Vital Signs Time Taken: 11:33 Temperature (F): 97.9 Height (in): 58 Pulse (bpm): 84 Weight (lbs): 137 Respiratory Rate (breaths/min): 16 Body Mass Index (BMI): 28.6 Blood Pressure (mmHg): 137/66 Reference Range: 80 - 120 mg / dl Electronic Signature(s) Signed: 01/22/2021 12:02:03 PM By: Donnamarie Poag Entered ByDonnamarie Poag on 01/22/2021 11:50:57

## 2021-01-22 NOTE — Progress Notes (Addendum)
Shenberger, Ivanell L. ([id number]) Visit Report for 01/22/2021 Chief Complaint Document Details Patient Name: Spinner, [name] Date of Service: 01/22/2021 11:15 AM Medical Record Number: [id number] Patient Account Number: [id number] Date of Birth/Sex: 12/10/1926 (87 y.o. F) Treating RN: Donnamarie Poag Primary Care Provider: [name] Other Clinician: Referring Provider: [name] Treating Provider/Extender: Skipper Cliche in Treatment: 30 Information Obtained from: Patient Chief Complaint Left Heel Pressure Ulcer Electronic Signature(s) Signed: 01/22/2021 11:53:08 AM By: Worthy Keeler PA-C Entered By: Worthy Keeler on 01/22/2021 11:53:08 Byington, Nora L. ([id number]) -------------------------------------------------------------------------------- Debridement Details Patient Name: Bastin, Keyanah L. Date of Service: 01/22/2021 11:15 AM Medical Record Number: [id number] Patient Account Number: [id number] Date of Birth/Sex: Jun 09, 1926 (87 y.o. F) Treating RN: Cornell Barman Primary Care Provider: [name] Other Clinician: Referring Provider: [name] Treating Provider/Extender: Skipper Cliche in Treatment: 30 Debridement Performed for Wound #3 Left Foot Assessment: Performed By: Physician Tommie Sams., PA-C Debridement Type: Debridement Level of Consciousness (Pre- Awake and Alert procedure): Pre-procedure Verification/Time Out Yes - 11:45 Taken: Start Time: 11:48 Pain Control: Lidocaine Total Area Debrided (L x W): 0.3 (cm) x 0.3 (cm) = 0.09 (cm) Tissue and other material Viable, Non-Viable, Slough, Subcutaneous, Skin: Dermis , Slough debrided: Level: Skin/Subcutaneous Tissue Debridement Description: Excisional Instrument: Curette Bleeding: Minimum Hemostasis Achieved: Pressure Response to Treatment: Procedure was tolerated well Level of Consciousness (Post- Awake and Alert procedure): Post Debridement Measurements of Total Wound Length: (cm)  0.2 Stage: Category/Stage III Width: (cm) 0.2 Depth: (cm) 0.2 Volume: (cm) 0.006 Character of Wound/Ulcer Post Debridement: Improved Post Procedure Diagnosis Same as Pre-procedure Electronic Signature(s) Signed: 01/24/2021 11:40:21 AM By: Gretta Cool, BSN, RN, CWS, Kim RN, BSN Signed: 01/24/2021 6:23:09 PM By: Worthy Keeler PA-C Previous Signature: 01/22/2021 12:02:03 PM Version By: Donnamarie Poag Previous Signature: 01/22/2021 4:19:41 PM Version By: Worthy Keeler PA-C Entered By: Gretta Cool, BSN, RN, CWS, Kim on 01/24/2021 11:40:20 Leidy, [name] ([id number]) -------------------------------------------------------------------------------- HPI Details Patient Name: Wargo, Siarra L. Date of Service: 01/22/2021 11:15 AM Medical Record Number: [id number] Patient Account Number: [id number] Date of Birth/Sex: 02/14/1927 (87 y.o. F) Treating RN: Donnamarie Poag Primary Care Provider: [name] Other Clinician: Referring Provider: [name] Treating Provider/Extender: Skipper Cliche in Treatment: 67 History of Present Illness HPI Description: 87 year old patient was recently seen by the PCPs office for significant pain right great toe which has been going on since July. She was initially treated with Keflex which she did not complete and after the office visit this time she has been put on doxycycline. at the time of her visit she was found to have a ulcer on the plantar surface of the right great toe and also had a pyogenic granuloma over this area. X-ray of the right foot done 12/29/2014 -- IMPRESSION:Soft-tissue swelling and ulceration right great toe. No underlying bony lytic lesion identified. If osteomyelitis remains of clinical concern MRI can be obtained. Past medical history significant for anemia, chronic kidney disease stage III, obesity, varicose veins, coronary artery disease, gout, history of nicotine addiction given up smoking in 2002, hypertension, status post cardiac  catheterization, status post abdominal hysterectomy, cholecystectomy and tonsillectomy. hemoglobin A1c done in August was 5.8 01/22/2015 -- at this stage the [facility] Walking boat was going to cost them significant amount of money and they want to defer using that at the present time. 01/29/2015 -- she had a podiatry appointment and they have trimmed her toenails. She has not heard back from the vascular office regarding her venous duplex study  and I have asked them to call personally so that they can get the appointment soon. 02/06/2015 -- they have made contact with the vascular office and from what I understand that test has been done but the report is pending. 02/19/2014 -- the vascular test is scheduled for tomorrow Readmission: 06/26/2020 upon evaluation today patient appears for initial evaluation in her clinic that she has been here before in 2016 and has been quite sometime. She did have a fractured hip in February on the 23rd 2022. Subsequently this had to be pinned and she ended up with a pressure injury on her heel following when she was using her foot to help move her around in the bed. Subsequently this has led to the wound that she has been dealing with since that time. She lives at home with her daughter currently she does have dementia. 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 07/03/2020 upon evaluation today patient appears to be doing well with regard to her heel ulcer. She did have arterial studies they appear to be doing excellent it was premature normal across the board with TBI's in the 90s and ABIs well within normal range. Nonetheless there does not appear to be any signs of arterial insufficiency whatsoever. With that being said the patient does have also signs of improvement there is some necrotic tissue in the base of the wound number to try to  clear some of that away today I do believe the Iodoflex/Iodosorb is doing well. 5/24; difficult punched out wound on the left medial heel. She has been using Iodoflex 07/17/2020 upon evaluation today patient appears to be doing well at this point in regard to her wound. I do feel like this is a little bit deeper but again that is what is expected as we continue with the Iodoflex I think that this is just going to get deeper until we get to the base of the wound. With that being said I think we are getting much closer to the base of the wound where we can have healthier tissue that we get a be managing here which that will be awesome. In the meantime I am not surprised by what I am seeing and in fact the wound appears to be better as compared to previous findings. 07/24/2020 upon evaluation today patient appears to be doing well with regard to her wound. Overall I am extremely pleased with where things stand today. I do not see any signs of active infection which is great and overall I think that the patient is making good progress. There is good to be some need for sharp debridement today. 07/31/2020 upon evaluation today patient appears to be doing better in regard to her heel ulcer. She has been tolerating the dressing changes without complication. Fortunately there does not appear to be any signs of active infection which is great and overall very pleased in this regard. No fevers, chills, nausea, vomiting, or diarrhea. 08/14/2020 upon evaluation today patient appears to be doing better in regard to her heel ulcer. I am very pleased in that regard. Unfortunately she still has a slight deep tissue injury in regard to the medial portion of her foot over the same area. Unfortunately I think this is something that if were not careful it was can open up into the wound. That is my main concern here based on what I see. Fortunately there  does not appear to be any evidence of active infection which is great  news and overall very pleased with where things stand at this point. 08/21/2020 upon evaluation today patient appears to be doing well with regard to her wound. She has been tolerating the dressing changes without complication. Fortunately there does not appear to be any signs of active infection at this time. No fevers, chills, nausea, vomiting, or diarrhea. I do believe the compression wrap was beneficial for her. 08/28/2020 upon evaluation today patient appears to be doing well with regard to her wounds currently. Fortunately there does not appear to be any signs of active infection at this time. No fevers, chills, nausea, vomiting, or diarrhea. With that being said I think that her leg is doing quite well to be honest. 09/04/2020 upon evaluation today patient appears to be doing well with regard to her wound on the heel. This is showing signs of good epithelial growth although there is probably can it be an indention where this heals that is okay as long as we get it closed. Fortunately I do not see any evidence of infection at this point. 09/13/2020 upon evaluation today patient appears to be doing well with regard to her wounds. She has been tolerating the dressing changes Lehn, Charmel L. ([id number]) without complication. Fortunately he is actually doing extremely well and I think she is making great progress this is measuring smaller. I would recommend that such that we continue with the [facility] likely since he is doing so well. 09/18/2020 upon evaluation today patient appears to be doing well with regard to her heel ulcer. She is making good progress and I am very pleased with what we see today. I think the Hydrofera Blue is still doing excellent. 10/02/2020 upon evaluation today patient's wound actually appears to be showing signs of good improvement in regard to the heel. I am very pleased in this regard. With that being said I do believe that the area which is somewhat been stable and  dry is starting to lift up and beginning to clear this away as well that is on the foot. Subsequently I am going to have to perform some debridement here and we did actually go ahead and allow this as a wound today as before it was just more deep tissue injury and eschar covering now I think it is a little bit more than that to be honest. 10/09/2020 upon evaluation today patient appears to be doing decently well in regard to her wounds. I am actually very pleased with where things stand today. There does not appear to be any signs of active infection which is great news. No fevers, chills, nausea, vomiting, or diarrhea. She is going require some sharp debridement today. 10/16/2020 upon evaluation today patient appears to be doing decently well in regard to her heel as well as the foot. Both are showing signs of good improvement which is great news. In general and extremely pleased with where things stand at this point. She is tolerating the [facility] well although this is getting so small in the heel I think collagen may be a better option here based on what I am seeing. With regard to the foot the Iodoflex/Iodosorb is still probably the best method here. 10/26/2020 upon evaluation today patient actually appears to be doing well in some respects today. She has been tolerating the dressing changes without complication and this is great news. The Hydrofera Blue is done well for the heel this actually  appears to be healed today. With that being said in regard to the foot I think we are ready to switch to [facility] based on what I see at this point. 10/30/2020 upon evaluation today patient appears to be doing well with regard to his wound. He has been tolerating the dressing changes without complication. Fortunately there does not appear to be any signs of active infection systemically at this time which is great news and overall very pleased with where things stand today. I do think that she is  making progress although it somewhat slow still where showing signs of improvement little by little here. 11/06/2020 upon evaluation today patient appears to be doing decently well in regard to her wound. We will start and see better overall appearance of the base of the wound which is great news she still continues to have some abnormal fluorescence signals with a MolecuLight DX that will be detailed below. 11/20/2020 upon evaluation today patient's wound actually showing signs of improvement. There is good to be some need for sharp debridement today and that was discussed with the patient. I am going to go ahead and clean this up but I do believe the [facility] is doing a great job. 11/27/2020; the patient had eschar over the original heel wound which I removed with a #3 curette there was nothing open here. The area is on the lateral left foot foot. 12/04/2020 upon evaluation today patient appears to be doing well with regard to her wound. The wound bed actually appeared to be doing well after I remove the dry endoform from the wound bed. This I tracked little bit of fluid but nonetheless underneath this appears to be doing awesome. I am very pleased with where things stand today. No fevers, chills, nausea, vomiting, or diarrhea. 12/11/2020 upon evaluation today patient appears to be doing well with regard to her wound although it keeps getting dry and filled up with the endoform I am not seeing any signs of infection but we are going to have to clean this away in order to try and get things moving in an appropriate direction. I discussed that with patient's daughter today as well. She is in agreement with proceeding as such. 12/21/2020 upon evaluation today patient appears to be doing well with regard to her wound this did not seal off like it was with the collagen but nonetheless also did not really feeling quite as much as I would like to have seen. I do believe that we may need to go ahead and  see about doing a debridement today and I really feel like we may want to switch back to the [facility] which I felt like was doing better in the past. Good news is the base of the wound appears healthy with good granulation tissue. 12/28/2020 upon evaluation patient's wound actually showed signs of doing quite well in regard to her foot. I think that we getting very close to complete resolution and overall I am extremely happy with where things stand today. 01/04/2021 upon evaluation today patient appears to be doing well with regard to her wound on the foot. This is good to require little bit of debridement today but overall seems to be doing quite well. I am actually very pleased with where we stand currently. 01/08/2021 upon evaluation today patient's wound actually showing signs of some improvement although she still has some callus buildup around the edges of the wound unfortunately I think this is continuing to rub despite what  we are doing currently. I think that we may need to go ahead and see about switching up the dressing a little bit I Minna recommend collagen and then on top of the collagen we will get a use some foam double layered cut in a doughnut to try to take pressure off of this area we will see how this does over the next week. 01/15/2021 upon evaluation today patient's wound is actually showing signs of being completely dried over with the collagen. This is definitely not what we are looking for. I think that the wound dried out too quickly is the main issue that we are running into at this point and I really think we may need to do something else try to keep that from occurring. 01/22/2021 upon evaluation today patient's wound actually did stay open it did not callus over as its been doing in the past this is good news. Also feel like it may not be quite as deep as what we have previously seen. There is still not as much improvement he does what I would like to see  but nonetheless I think we are headed in the right direction. Electronic Signature(s) Signed: 01/22/2021 11:53:26 AM By: Worthy Keeler PA-C Entered By: Worthy Keeler on 01/22/2021 11:53:26 Spooner, Julann L. ([id number]) -------------------------------------------------------------------------------- Physical Exam Details Patient Name: Lakatos, Brittish L. Date of Service: 01/22/2021 11:15 AM Medical Record Number: [id number] Patient Account Number: [id number] Date of Birth/Sex: 05-18-1926 (87 y.o. F) Treating RN: Donnamarie Poag Primary Care Provider: [name] Other Clinician: Referring Provider: [name] Treating Provider/Extender: Skipper Cliche in Treatment: 30 Constitutional Well-nourished and well-hydrated in no acute distress. Respiratory normal breathing without difficulty. Psychiatric this patient is able to make decisions and demonstrates good insight into disease process. Alert and Oriented x 3. pleasant and cooperative. Notes Upon inspection patient's wound bed showed signs of good granulation epithelization at this point. Fortunately I do not see any evidence of active infection at this time which is great and overall I am extremely pleased with where we stand currently. Electronic Signature(s) Signed: 01/22/2021 11:53:39 AM By: Worthy Keeler PA-C Entered By: Worthy Keeler on 01/22/2021 11:53:38 Aydelott, Devann L. ([id number]) -------------------------------------------------------------------------------- Physician Orders Details Patient Name: Azure, Ayssa L. Date of Service: 01/22/2021 11:15 AM Medical Record Number: [id number] Patient Account Number: [id number] Date of Birth/Sex: 03-01-1926 (87 y.o. F) Treating RN: Donnamarie Poag Primary Care Provider: [name] Other Clinician: Referring Provider: [name] Treating Provider/Extender: Skipper Cliche in Treatment: 59 Verbal / Phone Orders: No Diagnosis Coding ICD-10 Coding Code  Description [phone] Pressure ulcer of other site, stage 3 I10 Essential (primary) hypertension F01.50 Vascular dementia without behavioral disturbance I25.10 Atherosclerotic heart disease of native coronary artery without angina pectoris Follow-up Appointments o Return Appointment in 1 week. o Nurse Visit as needed Bathing/ Shower/ Hygiene o May shower with wound dressing protected with water repellent cover or cast protector. o No tub bath. Anesthetic (Use 'Patient Medications' Section for Anesthetic Order Entry) o Lidocaine applied to wound bed Edema Control - Lymphedema / Segmental Compressive Device / Other o Optional: One layer of unna paste to top of compression wrap (to act as an anchor). o Patient to wear own compression stockings. Remove compression stockings every night before going to bed and put on every morning when getting up. - right leg o Elevate legs to the level of the heart and pump ankles as often as possible o Elevate leg(s) parallel to the floor when sitting. o  DO YOUR BEST to sleep in the bed at night. DO NOT sleep in your recliner. Long hours of sitting in a recliner leads to swelling of the legs and/or potential wounds on your backside. Off-Loading o Open toe surgical shoe - left o Other: - Offloading boots while in bed; Try pillow under sheet to keep it in place. Additional Orders / Instructions o Follow Nutritious Diet and Increase Protein Intake Wound Treatment Wound #3 - Foot Wound Laterality: Left Cleanser: Soap and Water 1 x Per Week/30 Days Discharge Instructions: Gently cleanse wound with antibacterial soap, rinse and pat dry prior to dressing wounds Primary Dressing: Xeroform-HBD 2x2 (in/in) 1 x Per Week/30 Days Discharge Instructions: Inside and over wound-Apply Xeroform-HBD 2x2 (in/in) as directed Secondary Dressing: ABD Pad 5x9 (in/in) 1 x Per Week/30 Days Discharge Instructions: Cover with ABD pad Compression Wrap:  Profore Lite LF 3 Multilayer Compression Bandaging System 1 x Per Week/30 Days Discharge Instructions: Apply 3 multi-layer wrap as prescribed. Electronic Signature(s) Signed: 01/22/2021 12:02:03 PM By: Donnamarie Poag Signed: 01/22/2021 4:19:41 PM By: Worthy Keeler PA-C Entered By: Donnamarie Poag on 01/22/2021 11:54:55 Winebarger, Adela L. ([id number]) Fritze, Encarnacion L. ([id number]) -------------------------------------------------------------------------------- Problem List Details Patient Name: Steptoe, Marchelle L. Date of Service: 01/22/2021 11:15 AM Medical Record Number: [id number] Patient Account Number: [id number] Date of Birth/Sex: 1926/05/24 (87 y.o. F) Treating RN: Donnamarie Poag Primary Care Provider: [name] Other Clinician: Referring Provider: [name] Treating Provider/Extender: Skipper Cliche in Treatment: 30 Active Problems ICD-10 Encounter Code Description Active Date MDM Diagnosis [phone] Pressure ulcer of other site, stage 3 10/02/2020 No Yes I10 Essential (primary) hypertension 06/26/2020 No Yes F01.50 Vascular dementia without behavioral disturbance 06/26/2020 No Yes I25.10 Atherosclerotic heart disease of native coronary artery without angina 06/26/2020 No Yes pectoris Inactive Problems Resolved Problems ICD-10 Code Description Active Date Resolved Date L89.623 Pressure ulcer of left heel, stage 3 06/26/2020 06/26/2020 Electronic Signature(s) Signed: 01/22/2021 11:53:05 AM By: Worthy Keeler PA-C Entered By: Worthy Keeler on 01/22/2021 11:53:05 Gotham, Lanyla L. ([id number]) -------------------------------------------------------------------------------- Progress Note Details Patient Name: Sparr, Kuuipo L. Date of Service: 01/22/2021 11:15 AM Medical Record Number: [id number] Patient Account Number: [id number] Date of Birth/Sex: 1926/09/19 (87 y.o. F) Treating RN: Donnamarie Poag Primary Care Provider: [name] Other Clinician: Referring Provider:  [name] Treating Provider/Extender: Skipper Cliche in Treatment: 30 Subjective Chief Complaint Information obtained from Patient Left Heel Pressure Ulcer History of Present Illness (HPI) 87 year old patient was recently seen by the PCPs office for significant pain right great toe which has been going on since July. She was initially treated with Keflex which she did not complete and after the office visit this time she has been put on doxycycline. at the time of her visit she was found to have a ulcer on the plantar surface of the right great toe and also had a pyogenic granuloma over this area. X-ray of the right foot done 12/29/2014 -- IMPRESSION:Soft-tissue swelling and ulceration right great toe. No underlying bony lytic lesion identified. If osteomyelitis remains of clinical concern MRI can be obtained. Past medical history significant for anemia, chronic kidney disease stage III, obesity, varicose veins, coronary artery disease, gout, history of nicotine addiction given up smoking in 2002, hypertension, status post cardiac catheterization, status post abdominal hysterectomy, cholecystectomy and tonsillectomy. hemoglobin A1c done in August was 5.8 01/22/2015 -- at this stage the [facility] Walking boat was going to cost them significant amount of money and they want to defer using that at the  present time. 01/29/2015 -- she had a podiatry appointment and they have trimmed her toenails. She has not heard back from the vascular office regarding her venous duplex study and I have asked them to call personally so that they can get the appointment soon. 02/06/2015 -- they have made contact with the vascular office and from what I understand that test has been done but the report is pending. 02/19/2014 -- the vascular test is scheduled for tomorrow Readmission: 06/26/2020 upon evaluation today patient appears for initial evaluation in her clinic that she has been here before in 2016 and has  been quite sometime. She did have a fractured hip in February on the 23rd 2022. Subsequently this had to be pinned and she ended up with a pressure injury on her heel following when she was using her foot to help move her around in the bed. Subsequently this has led to the wound that she has been dealing with since that time. She lives at home with her daughter currently she does have dementia. 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 07/03/2020 upon evaluation today patient appears to be doing well with regard to her heel ulcer. She did have arterial studies they appear to be doing excellent it was premature normal across the board with TBI's in the 90s and ABIs well within normal range. Nonetheless there does not appear to be any signs of arterial insufficiency whatsoever. With that being said the patient does have also signs of improvement there is some necrotic tissue in the base of the wound number to try to clear some of that away today I do believe the Iodoflex/Iodosorb is doing well. 5/24; difficult punched out wound on the left medial heel. She has been using Iodoflex 07/17/2020 upon evaluation today patient appears to be doing well at this point in regard to her wound. I do feel like this is a little bit deeper but again that is what is expected as we continue with the Iodoflex I think that this is just going to get deeper until we get to the base of the wound. With that being said I think we are getting much closer to the base of the wound where we can have healthier tissue that we get a be managing here which that will be awesome. In the meantime I am not surprised by what I am seeing and in fact the wound appears to be better as compared to previous findings. 07/24/2020 upon evaluation today patient appears to be doing well with regard to her wound. Overall I  am extremely pleased with where things stand today. I do not see any signs of active infection which is great and overall I think that the patient is making good progress. There is good to be some need for sharp debridement today. 07/31/2020 upon evaluation today patient appears to be doing better in regard to her heel ulcer. She has been tolerating the dressing changes without complication. Fortunately there does not appear to be any signs of active infection which is great and overall very pleased in this regard. No fevers, chills, nausea, vomiting, or diarrhea. 08/14/2020 upon evaluation today patient appears to be doing better in regard to her heel ulcer. I am very pleased in that regard. Unfortunately she still has a slight deep tissue injury in regard to the medial portion of her foot over the same area. Unfortunately I  think this is something that if were not careful it was can open up into the wound. That is my main concern here based on what I see. Fortunately there does not appear to be any evidence of active infection which is great news and overall very pleased with where things stand at this point. 08/21/2020 upon evaluation today patient appears to be doing well with regard to her wound. She has been tolerating the dressing changes without complication. Fortunately there does not appear to be any signs of active infection at this time. No fevers, chills, nausea, vomiting, or diarrhea. I do believe the compression wrap was beneficial for her. 08/28/2020 upon evaluation today patient appears to be doing well with regard to her wounds currently. Fortunately there does not appear to be any signs of active infection at this time. No fevers, chills, nausea, vomiting, or diarrhea. With that being said I think that her leg is doing quite well to be honest. Wicklund, Emanuela L. ([id number]) 09/04/2020 upon evaluation today patient appears to be doing well with regard to her wound on the heel. This is  showing signs of good epithelial growth although there is probably can it be an indention where this heals that is okay as long as we get it closed. Fortunately I do not see any evidence of infection at this point. 09/13/2020 upon evaluation today patient appears to be doing well with regard to her wounds. She has been tolerating the dressing changes without complication. Fortunately he is actually doing extremely well and I think she is making great progress this is measuring smaller. I would recommend that such that we continue with the [facility] likely since he is doing so well. 09/18/2020 upon evaluation today patient appears to be doing well with regard to her heel ulcer. She is making good progress and I am very pleased with what we see today. I think the Hydrofera Blue is still doing excellent. 10/02/2020 upon evaluation today patient's wound actually appears to be showing signs of good improvement in regard to the heel. I am very pleased in this regard. With that being said I do believe that the area which is somewhat been stable and dry is starting to lift up and beginning to clear this away as well that is on the foot. Subsequently I am going to have to perform some debridement here and we did actually go ahead and allow this as a wound today as before it was just more deep tissue injury and eschar covering now I think it is a little bit more than that to be honest. 10/09/2020 upon evaluation today patient appears to be doing decently well in regard to her wounds. I am actually very pleased with where things stand today. There does not appear to be any signs of active infection which is great news. No fevers, chills, nausea, vomiting, or diarrhea. She is going require some sharp debridement today. 10/16/2020 upon evaluation today patient appears to be doing decently well in regard to her heel as well as the foot. Both are showing signs of good improvement which is great news. In general and  extremely pleased with where things stand at this point. She is tolerating the [facility] well although this is getting so small in the heel I think collagen may be a better option here based on what I am seeing. With regard to the foot the Iodoflex/Iodosorb is still probably the best method here. 10/26/2020 upon evaluation today patient actually appears to be doing  well in some respects today. She has been tolerating the dressing changes without complication and this is great news. The Hydrofera Blue is done well for the heel this actually appears to be healed today. With that being said in regard to the foot I think we are ready to switch to [facility] based on what I see at this point. 10/30/2020 upon evaluation today patient appears to be doing well with regard to his wound. He has been tolerating the dressing changes without complication. Fortunately there does not appear to be any signs of active infection systemically at this time which is great news and overall very pleased with where things stand today. I do think that she is making progress although it somewhat slow still where showing signs of improvement little by little here. 11/06/2020 upon evaluation today patient appears to be doing decently well in regard to her wound. We will start and see better overall appearance of the base of the wound which is great news she still continues to have some abnormal fluorescence signals with a MolecuLight DX that will be detailed below. 11/20/2020 upon evaluation today patient's wound actually showing signs of improvement. There is good to be some need for sharp debridement today and that was discussed with the patient. I am going to go ahead and clean this up but I do believe the [facility] is doing a great job. 11/27/2020; the patient had eschar over the original heel wound which I removed with a #3 curette there was nothing open here. The area is on the lateral left foot foot. 12/04/2020  upon evaluation today patient appears to be doing well with regard to her wound. The wound bed actually appeared to be doing well after I remove the dry endoform from the wound bed. This I tracked little bit of fluid but nonetheless underneath this appears to be doing awesome. I am very pleased with where things stand today. No fevers, chills, nausea, vomiting, or diarrhea. 12/11/2020 upon evaluation today patient appears to be doing well with regard to her wound although it keeps getting dry and filled up with the endoform I am not seeing any signs of infection but we are going to have to clean this away in order to try and get things moving in an appropriate direction. I discussed that with patient's daughter today as well. She is in agreement with proceeding as such. 12/21/2020 upon evaluation today patient appears to be doing well with regard to her wound this did not seal off like it was with the collagen but nonetheless also did not really feeling quite as much as I would like to have seen. I do believe that we may need to go ahead and see about doing a debridement today and I really feel like we may want to switch back to the [facility] which I felt like was doing better in the past. Good news is the base of the wound appears healthy with good granulation tissue. 12/28/2020 upon evaluation patient's wound actually showed signs of doing quite well in regard to her foot. I think that we getting very close to complete resolution and overall I am extremely happy with where things stand today. 01/04/2021 upon evaluation today patient appears to be doing well with regard to her wound on the foot. This is good to require little bit of debridement today but overall seems to be doing quite well. I am actually very pleased with where we stand currently. 01/08/2021 upon evaluation today patient's wound  actually showing signs of some improvement although she still has some callus buildup around the  edges of the wound unfortunately I think this is continuing to rub despite what we are doing currently. I think that we may need to go ahead and see about switching up the dressing a little bit I Minna recommend collagen and then on top of the collagen we will get a use some foam double layered cut in a doughnut to try to take pressure off of this area we will see how this does over the next week. 01/15/2021 upon evaluation today patient's wound is actually showing signs of being completely dried over with the collagen. This is definitely not what we are looking for. I think that the wound dried out too quickly is the main issue that we are running into at this point and I really think we may need to do something else try to keep that from occurring. 01/22/2021 upon evaluation today patient's wound actually did stay open it did not callus over as its been doing in the past this is good news. Also feel like it may not be quite as deep as what we have previously seen. There is still not as much improvement he does what I would like to see but nonetheless I think we are headed in the right direction. Maggi, Jamina L. ([id number]) Objective Constitutional Well-nourished and well-hydrated in no acute distress. Vitals Time Taken: 11:33 AM, Height: 58 in, Weight: 137 lbs, BMI: 28.6, Temperature: 97.9 F, Pulse: 84 bpm, Respiratory Rate: 16 breaths/min, Blood Pressure: 137/66 mmHg. Respiratory normal breathing without difficulty. Psychiatric this patient is able to make decisions and demonstrates good insight into disease process. Alert and Oriented x 3. pleasant and cooperative. General Notes: Upon inspection patient's wound bed showed signs of good granulation epithelization at this point. Fortunately I do not see any evidence of active infection at this time which is great and overall I am extremely pleased with where we stand currently. Integumentary (Hair, Skin) Wound #3 status is Open. Original  cause of wound was Pressure Injury. The date acquired was: 10/02/2020. The wound has been in treatment 16 weeks. The wound is located on the Left Foot. The wound measures 0.2cm length x 0.2cm width x 0.2cm depth; 0.031cm^2 area and 0.006cm^3 volume. There is Fat Layer (Subcutaneous Tissue) exposed. There is no tunneling noted, however, there is undermining starting at 11:00 and ending at 12:00 with a maximum distance of 0.1cm. There is a medium amount of serous drainage noted. The wound margin is thickened. There is large (67-100%) red, pink granulation within the wound bed. There is a small (1-33%) amount of necrotic tissue within the wound bed including Adherent Slough. Assessment Active Problems ICD-10 Pressure ulcer of other site, stage 3 Essential (primary) hypertension Vascular dementia without behavioral disturbance Atherosclerotic heart disease of native coronary artery without angina pectoris Procedures Wound #3 Pre-procedure diagnosis of Wound #3 is a Pressure Ulcer located on the Left Foot . There was a Excisional Skin/Subcutaneous Tissue Debridement with a total area of 0.09 sq cm performed by Tommie Sams., PA-C. With the following instrument(s): Curette to remove Viable and Non-Viable tissue/material. Material removed includes Subcutaneous Tissue, Slough, and Skin: Dermis after achieving pain control using Lidocaine. A time out was conducted at 11:45, prior to the start of the procedure. A Minimum amount of bleeding was controlled with Pressure. The procedure was tolerated well. Post Debridement Measurements: 0.2cm length x 0.2cm width x 0.2cm depth; 0.006cm^3 volume. Post debridement  Stage noted as Category/Stage III. Character of Wound/Ulcer Post Debridement is improved. Post procedure Diagnosis Wound #3: Same as Pre-Procedure Pre-procedure diagnosis of Wound #3 is a Pressure Ulcer located on the Left Foot . There was a Three Layer Compression Therapy Procedure by Donnamarie Poag,  RN. Post procedure Diagnosis Wound #3: Same as Pre-Procedure Plan Follow-up Appointments: Return Appointment in 1 week. Finkbiner, Spring L. ([id number]) Nurse Visit as needed Bathing/ Shower/ Hygiene: May shower with wound dressing protected with water repellent cover or cast protector. No tub bath. Anesthetic (Use 'Patient Medications' Section for Anesthetic Order Entry): Lidocaine applied to wound bed Edema Control - Lymphedema / Segmental Compressive Device / Other: Optional: One layer of unna paste to top of compression wrap (to act as an anchor). Patient to wear own compression stockings. Remove compression stockings every night before going to bed and put on every morning when getting up. - right leg Elevate legs to the level of the heart and pump ankles as often as possible Elevate leg(s) parallel to the floor when sitting. DO YOUR BEST to sleep in the bed at night. DO NOT sleep in your recliner. Long hours of sitting in a recliner leads to swelling of the legs and/or potential wounds on your backside. Off-Loading: Open toe surgical shoe - left Other: - Offloading boots while in bed; Try pillow under sheet to keep it in place. Additional Orders / Instructions: Follow Nutritious Diet and Increase Protein Intake WOUND #3: - Foot Wound Laterality: Left Cleanser: Soap and Water 1 x Per Week/30 Days Discharge Instructions: Gently cleanse wound with antibacterial soap, rinse and pat dry prior to dressing wounds Primary Dressing: Xeroform-HBD 2x2 (in/in) 1 x Per Week/30 Days Discharge Instructions: Inside and over wound-Apply Xeroform-HBD 2x2 (in/in) as directed Secondary Dressing: ABD Pad 5x9 (in/in) 1 x Per Week/30 Days Discharge Instructions: Cover with ABD pad Compression Wrap: Profore Lite LF 3 Multilayer Compression Bandaging System 1 x Per Week/30 Days Discharge Instructions: Apply 3 multi-layer wrap as prescribed. 1. I would recommend that we go ahead and continue with the  wound care measures as before the patient is in agreement with plan. This includes the use of the Xeroform gauze dressing we will see how this continues to do for her. 2. I am also can recommend that we have the patient continue to use the compression wrapping which I feel like is doing a good job keeping edema under control. 3. We will also monitor for improvement if she is not having any signs of improvement by next week with regard to the appearance of the wound I may use just a little bit of collagen underneath a petroleum gauze and see if that will keep it moist while at the same time allowing this to heal more effectively and quickly. We will see patient back for reevaluation in 1 week here in the clinic. If anything worsens or changes patient will contact our office for additional recommendations. Electronic Signature(s) Signed: 01/24/2021 11:41:02 AM By: Gretta Cool, BSN, RN, CWS, Kim RN, BSN Signed: 01/24/2021 6:23:09 PM By: Worthy Keeler PA-C Previous Signature: 01/22/2021 4:18:44 PM Version By: Worthy Keeler PA-C Previous Signature: 01/22/2021 11:54:28 AM Version By: Worthy Keeler PA-C Entered By: Gretta Cool BSN, RN, CWS, Kim on 01/24/2021 11:41:01 Duch, Whitnee L. ([id number]) -------------------------------------------------------------------------------- SuperBill Details Patient Name: Sosa, Allure L. Date of Service: 01/22/2021 Medical Record Number: [id number] Patient Account Number: [id number] Date of Birth/Sex: [birth year] (87 y.o. F) Treating RN: Donnamarie Poag Primary Care Provider: [name] Other  Clinician: Referring Provider: [name] Treating Provider/Extender: Skipper Cliche in Treatment: 30 Diagnosis Coding ICD-10 Codes Code Description [id number] Pressure ulcer of other site, stage 3 I10 Essential (primary) hypertension F01.50 Vascular dementia without behavioral disturbance I25.10 Atherosclerotic heart disease of native coronary artery without angina  pectoris Facility Procedures CPT4 Code: 94320037 Description: 94446 - DEB SUBQ TISSUE 20 SQ CM/< Modifier: Quantity: 1 CPT4 Code: Description: ICD-10 Diagnosis Description L89.893 Pressure ulcer of other site, stage 3 Modifier: Quantity: Physician Procedures CPT4 Code: 1901222 Description: 11042 - WC PHYS SUBQ TISS 20 SQ CM Modifier: Quantity: 1 CPT4 Code: Description: ICD-10 Diagnosis Description L89.893 Pressure ulcer of other site, stage 3 Modifier: Quantity: Electronic Signature(s) Signed: 01/22/2021 11:54:36 AM By: Worthy Keeler PA-C Entered By: Worthy Keeler on 01/22/2021 11:54:35

## 2021-01-23 ENCOUNTER — Other Ambulatory Visit: Payer: Self-pay | Admitting: Family Medicine

## 2021-01-23 DIAGNOSIS — I82461 Acute embolism and thrombosis of right calf muscular vein: Secondary | ICD-10-CM

## 2021-01-29 ENCOUNTER — Encounter: Payer: PPO | Admitting: Physician Assistant

## 2021-01-29 ENCOUNTER — Other Ambulatory Visit: Payer: Self-pay

## 2021-01-29 DIAGNOSIS — L89893 Pressure ulcer of other site, stage 3: Secondary | ICD-10-CM | POA: Diagnosis not present

## 2021-01-29 NOTE — Progress Notes (Addendum)
[name], [name] ([id number]) Visit Report for 01/29/2021 Chief Complaint Document Details Patient Name: [name], [name] Date of Service: 01/29/2021 11:15 AM Medical Record Number: [id number] Patient Account Number: [id number] Date of Birth/Sex: Jun 23, 1926 (87 y.o. F) Treating RN: Donnamarie Poag Primary Care Provider: [name] Other Clinician: Referring Provider: [name] Treating Provider/Extender: Skipper Cliche in Treatment: 31 Information Obtained from: Patient Chief Complaint Left Heel Pressure Ulcer Electronic Signature(s) Signed: 01/29/2021 11:35:08 AM By: Worthy Keeler PA-C Entered By: Worthy Keeler on 01/29/2021 11:35:08 [name], [name] ([id number]) -------------------------------------------------------------------------------- Debridement Details Patient Name: [name], [name] Date of Service: 01/29/2021 11:15 AM Medical Record Number: [id number] Patient Account Number: [id number] Date of Birth/Sex: 1926-12-16 (87 y.o. F) Treating RN: Donnamarie Poag Primary Care Provider: [name] Other Clinician: Referring Provider: [name] Treating Provider/Extender: Skipper Cliche in Treatment: 31 Debridement Performed for Wound #3 Left Foot Assessment: Performed By: Physician Tommie Sams., PA-C Debridement Type: Debridement Level of Consciousness (Pre- Awake and Alert procedure): Pre-procedure Verification/Time Out Yes - 12:18 Taken: Start Time: 12:19 Pain Control: Lidocaine Total Area Debrided (L x W): 0.4 (cm) x 0.4 (cm) = 0.16 (cm) Tissue and other material Viable, Non-Viable, Slough, Subcutaneous, Slough debrided: Level: Skin/Subcutaneous Tissue Debridement Description: Excisional Instrument: Curette Bleeding: Minimum Hemostasis Achieved: Pressure Response to Treatment: Procedure was tolerated well Level of Consciousness (Post- Awake and Alert procedure): Post Debridement Measurements of Total Wound Length: (cm)  0.4 Stage: Category/Stage III Width: (cm) 0.4 Depth: (cm) 0.4 Volume: (cm) 0.05 Character of Wound/Ulcer Post Debridement: Improved Post Procedure Diagnosis Same as Pre-procedure Electronic Signature(s) Signed: 01/29/2021 4:15:42 PM By: Donnamarie Poag Signed: 01/29/2021 5:37:18 PM By: Worthy Keeler PA-C Entered By: Donnamarie Poag on 01/29/2021 12:20:33 [name], [name] ([id number]) -------------------------------------------------------------------------------- HPI Details Patient Name: [name], [name] Date of Service: 01/29/2021 11:15 AM Medical Record Number: [id number] Patient Account Number: [id number] Date of Birth/Sex: 12/02/1926 (87 y.o. F) Treating RN: Donnamarie Poag Primary Care Provider: [name] Other Clinician: Referring Provider: [name] Treating Provider/Extender: Skipper Cliche in Treatment: 37 History of Present Illness HPI Description: 87 year old patient was recently seen by the PCPs office for significant pain right great toe which has been going on since July. She was initially treated with Keflex which she did not complete and after the office visit this time she has been put on doxycycline. at the time of her visit she was found to have a ulcer on the plantar surface of the right great toe and also had a pyogenic granuloma over this area. X-ray of the right foot done 12/29/2014 -- IMPRESSION:Soft-tissue swelling and ulceration right great toe. No underlying bony lytic lesion identified. If osteomyelitis remains of clinical concern MRI can be obtained. Past medical history significant for anemia, chronic kidney disease stage III, obesity, varicose veins, coronary artery disease, gout, history of nicotine addiction given up smoking in 2002, hypertension, status post cardiac catheterization, status post abdominal hysterectomy, cholecystectomy and tonsillectomy. hemoglobin A1c done in August was 5.8 01/22/2015 -- at this stage the [facility] Walking boat was  going to cost them significant amount of money and they want to defer using that at the present time. 01/29/2015 -- she had a podiatry appointment and they have trimmed her toenails. She has not heard back from the vascular office regarding her venous duplex study and I have asked them to call personally so that they can get the appointment soon. 02/06/2015 -- they have made contact with the vascular office and from what I understand  that test has been done but the report is pending. 02/19/2014 -- the vascular test is scheduled for tomorrow Readmission: 06/26/2020 upon evaluation today patient appears for initial evaluation in her clinic that she has been here before in 2016 and has been quite sometime. She did have a fractured hip in February on the 23rd 2022. Subsequently this had to be pinned and she ended up with a pressure injury on her heel following when she was using her foot to help move her around in the bed. Subsequently this has led to the wound that she has been dealing with since that time. She lives at home with her daughter currently she does have dementia. 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 07/03/2020 upon evaluation today patient appears to be doing well with regard to her heel ulcer. She did have arterial studies they appear to be doing excellent it was premature normal across the board with TBI's in the 90s and ABIs well within normal range. Nonetheless there does not appear to be any signs of arterial insufficiency whatsoever. With that being said the patient does have also signs of improvement there is some necrotic tissue in the base of the wound number to try to clear some of that away today I do believe the Iodoflex/Iodosorb is doing well. 5/24; difficult punched out wound on the left medial heel. She has been using Iodoflex 07/17/2020  upon evaluation today patient appears to be doing well at this point in regard to her wound. I do feel like this is a little bit deeper but again that is what is expected as we continue with the Iodoflex I think that this is just going to get deeper until we get to the base of the wound. With that being said I think we are getting much closer to the base of the wound where we can have healthier tissue that we get a be managing here which that will be awesome. In the meantime I am not surprised by what I am seeing and in fact the wound appears to be better as compared to previous findings. 07/24/2020 upon evaluation today patient appears to be doing well with regard to her wound. Overall I am extremely pleased with where things stand today. I do not see any signs of active infection which is great and overall I think that the patient is making good progress. There is good to be some need for sharp debridement today. 07/31/2020 upon evaluation today patient appears to be doing better in regard to her heel ulcer. She has been tolerating the dressing changes without complication. Fortunately there does not appear to be any signs of active infection which is great and overall very pleased in this regard. No fevers, chills, nausea, vomiting, or diarrhea. 08/14/2020 upon evaluation today patient appears to be doing better in regard to her heel ulcer. I am very pleased in that regard. Unfortunately she still has a slight deep tissue injury in regard to the medial portion of her foot over the same area. Unfortunately I think this is something that if were not careful it was can open up into the wound. That is my main concern here based on what I see. Fortunately there does not appear to be any evidence of active infection which is great news and overall very pleased with where things stand at this point. 08/21/2020 upon evaluation today patient appears  to be doing well with regard to her wound. She has been  tolerating the dressing changes without complication. Fortunately there does not appear to be any signs of active infection at this time. No fevers, chills, nausea, vomiting, or diarrhea. I do believe the compression wrap was beneficial for her. 08/28/2020 upon evaluation today patient appears to be doing well with regard to her wounds currently. Fortunately there does not appear to be any signs of active infection at this time. No fevers, chills, nausea, vomiting, or diarrhea. With that being said I think that her leg is doing quite well to be honest. 09/04/2020 upon evaluation today patient appears to be doing well with regard to her wound on the heel. This is showing signs of good epithelial growth although there is probably can it be an indention where this heals that is okay as long as we get it closed. Fortunately I do not see any evidence of infection at this point. 09/13/2020 upon evaluation today patient appears to be doing well with regard to her wounds. She has been tolerating the dressing changes [name], [name] ([id number]) without complication. Fortunately he is actually doing extremely well and I think she is making great progress this is measuring smaller. I would recommend that such that we continue with the [facility] likely since he is doing so well. 09/18/2020 upon evaluation today patient appears to be doing well with regard to her heel ulcer. She is making good progress and I am very pleased with what we see today. I think the Hydrofera Blue is still doing excellent. 10/02/2020 upon evaluation today patient's wound actually appears to be showing signs of good improvement in regard to the heel. I am very pleased in this regard. With that being said I do believe that the area which is somewhat been stable and dry is starting to lift up and beginning to clear this away as well that is on the foot. Subsequently I am going to have to perform some debridement here and we did actually  go ahead and allow this as a wound today as before it was just more deep tissue injury and eschar covering now I think it is a little bit more than that to be honest. 10/09/2020 upon evaluation today patient appears to be doing decently well in regard to her wounds. I am actually very pleased with where things stand today. There does not appear to be any signs of active infection which is great news. No fevers, chills, nausea, vomiting, or diarrhea. She is going require some sharp debridement today. 10/16/2020 upon evaluation today patient appears to be doing decently well in regard to her heel as well as the foot. Both are showing signs of good improvement which is great news. In general and extremely pleased with where things stand at this point. She is tolerating the [facility] well although this is getting so small in the heel I think collagen may be a better option here based on what I am seeing. With regard to the foot the Iodoflex/Iodosorb is still probably the best method here. 10/26/2020 upon evaluation today patient actually appears to be doing well in some respects today. She has been tolerating the dressing changes without complication and this is great news. The Hydrofera Blue is done well for the heel this actually appears to be healed today. With that being said in regard to the foot I think we are ready to switch to [facility] based on what I see at this  point. 10/30/2020 upon evaluation today patient appears to be doing well with regard to his wound. He has been tolerating the dressing changes without complication. Fortunately there does not appear to be any signs of active infection systemically at this time which is great news and overall very pleased with where things stand today. I do think that she is making progress although it somewhat slow still where showing signs of improvement little by little here. 11/06/2020 upon evaluation today patient appears to be doing decently  well in regard to her wound. We will start and see better overall appearance of the base of the wound which is great news she still continues to have some abnormal fluorescence signals with a MolecuLight DX that will be detailed below. 11/20/2020 upon evaluation today patient's wound actually showing signs of improvement. There is good to be some need for sharp debridement today and that was discussed with the patient. I am going to go ahead and clean this up but I do believe the [facility] is doing a great job. 11/27/2020; the patient had eschar over the original heel wound which I removed with a #3 curette there was nothing open here. The area is on the lateral left foot foot. 12/04/2020 upon evaluation today patient appears to be doing well with regard to her wound. The wound bed actually appeared to be doing well after I remove the dry endoform from the wound bed. This I tracked little bit of fluid but nonetheless underneath this appears to be doing awesome. I am very pleased with where things stand today. No fevers, chills, nausea, vomiting, or diarrhea. 12/11/2020 upon evaluation today patient appears to be doing well with regard to her wound although it keeps getting dry and filled up with the endoform I am not seeing any signs of infection but we are going to have to clean this away in order to try and get things moving in an appropriate direction. I discussed that with patient's daughter today as well. She is in agreement with proceeding as such. 12/21/2020 upon evaluation today patient appears to be doing well with regard to her wound this did not seal off like it was with the collagen but nonetheless also did not really feeling quite as much as I would like to have seen. I do believe that we may need to go ahead and see about doing a debridement today and I really feel like we may want to switch back to the [facility] which I felt like was doing better in the past. Good news is the  base of the wound appears healthy with good granulation tissue. 12/28/2020 upon evaluation patient's wound actually showed signs of doing quite well in regard to her foot. I think that we getting very close to complete resolution and overall I am extremely happy with where things stand today. 01/04/2021 upon evaluation today patient appears to be doing well with regard to her wound on the foot. This is good to require little bit of debridement today but overall seems to be doing quite well. I am actually very pleased with where we stand currently. 01/08/2021 upon evaluation today patient's wound actually showing signs of some improvement although she still has some callus buildup around the edges of the wound unfortunately I think this is continuing to rub despite what we are doing currently. I think that we may need to go ahead and see about switching up the dressing a little bit I Minna recommend collagen and then on top  of the collagen we will get a use some foam double layered cut in a doughnut to try to take pressure off of this area we will see how this does over the next week. 01/15/2021 upon evaluation today patient's wound is actually showing signs of being completely dried over with the collagen. This is definitely not what we are looking for. I think that the wound dried out too quickly is the main issue that we are running into at this point and I really think we may need to do something else try to keep that from occurring. 01/22/2021 upon evaluation today patient's wound actually did stay open it did not callus over as its been doing in the past this is good news. Also feel like it may not be quite as deep as what we have previously seen. There is still not as much improvement he does what I would like to see but nonetheless I think we are headed in the right direction. 01/29/2021 upon evaluation today patient appears to be doing okay in regard to her wound is not too dry but  unfortunately is too wet the Xeroform just did not do what I was hoping that she made things a little bit worse as far as size wise I am actually going to try at this point considering this is more open but not as deep the Hydrofera Blue to see if this will be beneficial at this time. Again we seem to be cycling through things and it is becoming a little difficult to get this to completely close but in the past Hydrofera Blue is what she did the best with 1 week to keep it from closing up prematurely. Electronic Signature(s) Sollenberger, Yulitza L. ([id number]) Signed: 01/29/2021 5:07:13 PM By: Worthy Keeler PA-C Entered By: Worthy Keeler on 01/29/2021 17:07:13 [name], [name] ([id number]) -------------------------------------------------------------------------------- Physical Exam Details Patient Name: [name], [name] Date of Service: 01/29/2021 11:15 AM Medical Record Number: [id number] Patient Account Number: [id number] Date of Birth/Sex: 14-May-1926 (87 y.o. F) Treating RN: Cornell Barman Primary Care Provider: [name] Other Clinician: Referring Provider: [name] Treating Provider/Extender: Skipper Cliche in Treatment: 79 Constitutional Well-nourished and well-hydrated in no acute distress. Respiratory normal breathing without difficulty. Psychiatric this patient is able to make decisions and demonstrates good insight into disease process. Alert and Oriented x 3. pleasant and cooperative. Notes Upon inspection patient's wound bed actually showed signs of good granulation epithelization at this point. Fortunately I do not see any evidence of active infection locally nor systemically which is great news. She did have a little abrasion on the upper part of her leg but nothing too significant here. Electronic Signature(s) Signed: 01/29/2021 5:07:35 PM By: Worthy Keeler PA-C Entered By: Worthy Keeler on 01/29/2021 17:07:34 [name], [name]  ([id number]) -------------------------------------------------------------------------------- Physician Orders Details Patient Name: [name], [name] Date of Service: 01/29/2021 11:15 AM Medical Record Number: [id number] Patient Account Number: [id number] Date of Birth/Sex: Sep 29, 1926 (87 y.o. F) Treating RN: Donnamarie Poag Primary Care Provider: [name] Other Clinician: Referring Provider: [name] Treating Provider/Extender: Skipper Cliche in Treatment: 30 Verbal / Phone Orders: No Diagnosis Coding ICD-10 Coding Code Description [phone] Pressure ulcer of other site, stage 3 I10 Essential (primary) hypertension F01.50 Vascular dementia without behavioral disturbance I25.10 Atherosclerotic heart disease of native coronary artery without angina pectoris Follow-up Appointments o Return Appointment in 1 week. o Nurse Visit as needed Bathing/ Shower/ Hygiene o May shower with wound dressing protected with water repellent  cover or cast protector. o No tub bath. Anesthetic (Use 'Patient Medications' Section for Anesthetic Order Entry) o Lidocaine applied to wound bed Edema Control - Lymphedema / Segmental Compressive Device / Other o Optional: One layer of unna paste to top of compression wrap (to act as an anchor). o Patient to wear own compression stockings. Remove compression stockings every night before going to bed and put on every morning when getting up. - right leg o Elevate legs to the level of the heart and pump ankles as often as possible o Elevate leg(s) parallel to the floor when sitting. o DO YOUR BEST to sleep in the bed at night. DO NOT sleep in your recliner. Long hours of sitting in a recliner leads to swelling of the legs and/or potential wounds on your backside. Off-Loading o Open toe surgical shoe - left o Other: - Offloading boots while in bed; Try pillow under sheet to keep it in place. Additional Orders / Instructions o  Follow Nutritious Diet and Increase Protein Intake Wound Treatment Wound #3 - Foot Wound Laterality: Left Cleanser: Soap and Water 1 x Per Week/30 Days Discharge Instructions: Gently cleanse wound with antibacterial soap, rinse and pat dry prior to dressing wounds Primary Dressing: Hydrofera Blue Ready Transfer Foam, 2.5x2.5 (in/in) 1 x Per Week/30 Days Discharge Instructions: ONY ON TOP of wound only-Apply Hydrofera Blue Ready to wound bed as directed Secondary Dressing: ABD Pad 5x9 (in/in) 1 x Per Week/30 Days Discharge Instructions: Cover with ABD pad Compression Wrap: Profore Lite LF 3 Multilayer Compression Bandaging System 1 x Per Week/30 Days Discharge Instructions: Apply 3 multi-layer wrap as prescribed. Wound #4 - Lower Leg Wound Laterality: Left Cleanser: Soap and Water 1 x Per Week/30 Days Discharge Instructions: Gently cleanse wound with antibacterial soap, rinse and pat dry prior to dressing wounds Primary Dressing: Xeroform-HBD 2x2 (in/in) 1 x Per Week/30 Days Discharge Instructions: Apply Xeroform-HBD 2x2 (in/in) as directed [name], [name] ([id number]) Secondary Dressing: Zetuvit Plus Silicone Non-bordered 5x5 (in/in) 1 x Per Week/30 Days Compression Wrap: Profore Lite LF 3 Multilayer Compression Bandaging System 1 x Per Week/30 Days Discharge Instructions: Keep off of wound 2 Apply 3 multi-layer wrap as prescribed. Electronic Signature(s) Signed: 01/29/2021 4:15:42 PM By: Donnamarie Poag Signed: 01/29/2021 5:37:18 PM By: Worthy Keeler PA-C Entered By: Donnamarie Poag on 01/29/2021 12:22:14 [name], [name] ([id number]) -------------------------------------------------------------------------------- Problem List Details Patient Name: Pingleton, Kalayla L. Date of Service: 01/29/2021 11:15 AM Medical Record Number: [id number] Patient Account Number: [id number] Date of Birth/Sex: 16-Jul-1926 (87 y.o. F) Treating RN: Donnamarie Poag Primary Care Provider: [name] Other  Clinician: Referring Provider: [name] Treating Provider/Extender: Skipper Cliche in Treatment: 31 Active Problems ICD-10 Encounter Code Description Active Date MDM Diagnosis [phone] Pressure ulcer of other site, stage 3 10/02/2020 No Yes I10 Essential (primary) hypertension 06/26/2020 No Yes F01.50 Vascular dementia without behavioral disturbance 06/26/2020 No Yes I25.10 Atherosclerotic heart disease of native coronary artery without angina 06/26/2020 No Yes pectoris Inactive Problems Resolved Problems ICD-10 Code Description Active Date Resolved Date L89.623 Pressure ulcer of left heel, stage 3 06/26/2020 06/26/2020 Electronic Signature(s) Signed: 01/29/2021 11:35:03 AM By: Worthy Keeler PA-C Entered By: Worthy Keeler on 01/29/2021 11:35:02 Detamore, Navya L. ([id number]) -------------------------------------------------------------------------------- Progress Note Details Patient Name: Brailsford, Donica L. Date of Service: 01/29/2021 11:15 AM Medical Record Number: [id number] Patient Account Number: [id number] Date of Birth/Sex: 12/13/1926 (87 y.o. F) Treating RN: Cornell Barman Primary Care Provider: [name] Other Clinician: Referring Provider: [name] Treating  Provider/Extender: Jeri Cos Weeks in Treatment: 31 Subjective Chief Complaint Information obtained from Patient Left Heel Pressure Ulcer History of Present Illness (HPI) 87 year old patient was recently seen by the PCPs office for significant pain right great toe which has been going on since July. She was initially treated with Keflex which she did not complete and after the office visit this time she has been put on doxycycline. at the time of her visit she was found to have a ulcer on the plantar surface of the right great toe and also had a pyogenic granuloma over this area. X-ray of the right foot done 12/29/2014 -- IMPRESSION:Soft-tissue swelling and ulceration right great toe. No  underlying bony lytic lesion identified. If osteomyelitis remains of clinical concern MRI can be obtained. Past medical history significant for anemia, chronic kidney disease stage III, obesity, varicose veins, coronary artery disease, gout, history of nicotine addiction given up smoking in 2002, hypertension, status post cardiac catheterization, status post abdominal hysterectomy, cholecystectomy and tonsillectomy. hemoglobin A1c done in August was 5.8 01/22/2015 -- at this stage the [facility] Walking boat was going to cost them significant amount of money and they want to defer using that at the present time. 01/29/2015 -- she had a podiatry appointment and they have trimmed her toenails. She has not heard back from the vascular office regarding her venous duplex study and I have asked them to call personally so that they can get the appointment soon. 02/06/2015 -- they have made contact with the vascular office and from what I understand that test has been done but the report is pending. 02/19/2014 -- the vascular test is scheduled for tomorrow Readmission: 06/26/2020 upon evaluation today patient appears for initial evaluation in her clinic that she has been here before in 2016 and has been quite sometime. She did have a fractured hip in February on the 23rd 2022. Subsequently this had to be pinned and she ended up with a pressure injury on her heel following when she was using her foot to help move her around in the bed. Subsequently this has led to the wound that she has been dealing with since that time. She lives at home with her daughter currently she does have dementia. 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 07/03/2020 upon evaluation today patient appears to be doing well with regard to her heel ulcer. She did have arterial studies they appear to be doing  excellent it was premature normal across the board with TBI's in the 90s and ABIs well within normal range. Nonetheless there does not appear to be any signs of arterial insufficiency whatsoever. With that being said the patient does have also signs of improvement there is some necrotic tissue in the base of the wound number to try to clear some of that away today I do believe the Iodoflex/Iodosorb is doing well. 5/24; difficult punched out wound on the left medial heel. She has been using Iodoflex 07/17/2020 upon evaluation today patient appears to be doing well at this point in regard to her wound. I do feel like this is a little bit deeper but again that is what is expected as we continue with the Iodoflex I think that this is just going to get deeper until we get to the base of the wound. With that being said I think we are getting much closer to the base of the wound  where we can have healthier tissue that we get a be managing here which that will be awesome. In the meantime I am not surprised by what I am seeing and in fact the wound appears to be better as compared to previous findings. 07/24/2020 upon evaluation today patient appears to be doing well with regard to her wound. Overall I am extremely pleased with where things stand today. I do not see any signs of active infection which is great and overall I think that the patient is making good progress. There is good to be some need for sharp debridement today. 07/31/2020 upon evaluation today patient appears to be doing better in regard to her heel ulcer. She has been tolerating the dressing changes without complication. Fortunately there does not appear to be any signs of active infection which is great and overall very pleased in this regard. No fevers, chills, nausea, vomiting, or diarrhea. 08/14/2020 upon evaluation today patient appears to be doing better in regard to her heel ulcer. I am very pleased in that regard. Unfortunately she still  has a slight deep tissue injury in regard to the medial portion of her foot over the same area. Unfortunately I think this is something that if were not careful it was can open up into the wound. That is my main concern here based on what I see. Fortunately there does not appear to be any evidence of active infection which is great news and overall very pleased with where things stand at this point. 08/21/2020 upon evaluation today patient appears to be doing well with regard to her wound. She has been tolerating the dressing changes without complication. Fortunately there does not appear to be any signs of active infection at this time. No fevers, chills, nausea, vomiting, or diarrhea. I do believe the compression wrap was beneficial for her. 08/28/2020 upon evaluation today patient appears to be doing well with regard to her wounds currently. Fortunately there does not appear to be any signs of active infection at this time. No fevers, chills, nausea, vomiting, or diarrhea. With that being said I think that her leg is doing quite well to be honest. Diener, Zaidee L. ([id number]) 09/04/2020 upon evaluation today patient appears to be doing well with regard to her wound on the heel. This is showing signs of good epithelial growth although there is probably can it be an indention where this heals that is okay as long as we get it closed. Fortunately I do not see any evidence of infection at this point. 09/13/2020 upon evaluation today patient appears to be doing well with regard to her wounds. She has been tolerating the dressing changes without complication. Fortunately he is actually doing extremely well and I think she is making great progress this is measuring smaller. I would recommend that such that we continue with the [facility] likely since he is doing so well. 09/18/2020 upon evaluation today patient appears to be doing well with regard to her heel ulcer. She is making good progress and I am  very pleased with what we see today. I think the Hydrofera Blue is still doing excellent. 10/02/2020 upon evaluation today patient's wound actually appears to be showing signs of good improvement in regard to the heel. I am very pleased in this regard. With that being said I do believe that the area which is somewhat been stable and dry is starting to lift up and beginning to clear this away as well that is on the foot. Subsequently  I am going to have to perform some debridement here and we did actually go ahead and allow this as a wound today as before it was just more deep tissue injury and eschar covering now I think it is a little bit more than that to be honest. 10/09/2020 upon evaluation today patient appears to be doing decently well in regard to her wounds. I am actually very pleased with where things stand today. There does not appear to be any signs of active infection which is great news. No fevers, chills, nausea, vomiting, or diarrhea. She is going require some sharp debridement today. 10/16/2020 upon evaluation today patient appears to be doing decently well in regard to her heel as well as the foot. Both are showing signs of good improvement which is great news. In general and extremely pleased with where things stand at this point. She is tolerating the [facility] well although this is getting so small in the heel I think collagen may be a better option here based on what I am seeing. With regard to the foot the Iodoflex/Iodosorb is still probably the best method here. 10/26/2020 upon evaluation today patient actually appears to be doing well in some respects today. She has been tolerating the dressing changes without complication and this is great news. The Hydrofera Blue is done well for the heel this actually appears to be healed today. With that being said in regard to the foot I think we are ready to switch to [facility] based on what I see at this point. 10/30/2020 upon  evaluation today patient appears to be doing well with regard to his wound. He has been tolerating the dressing changes without complication. Fortunately there does not appear to be any signs of active infection systemically at this time which is great news and overall very pleased with where things stand today. I do think that she is making progress although it somewhat slow still where showing signs of improvement little by little here. 11/06/2020 upon evaluation today patient appears to be doing decently well in regard to her wound. We will start and see better overall appearance of the base of the wound which is great news she still continues to have some abnormal fluorescence signals with a MolecuLight DX that will be detailed below. 11/20/2020 upon evaluation today patient's wound actually showing signs of improvement. There is good to be some need for sharp debridement today and that was discussed with the patient. I am going to go ahead and clean this up but I do believe the [facility] is doing a great job. 11/27/2020; the patient had eschar over the original heel wound which I removed with a #3 curette there was nothing open here. The area is on the lateral left foot foot. 12/04/2020 upon evaluation today patient appears to be doing well with regard to her wound. The wound bed actually appeared to be doing well after I remove the dry endoform from the wound bed. This I tracked little bit of fluid but nonetheless underneath this appears to be doing awesome. I am very pleased with where things stand today. No fevers, chills, nausea, vomiting, or diarrhea. 12/11/2020 upon evaluation today patient appears to be doing well with regard to her wound although it keeps getting dry and filled up with the endoform I am not seeing any signs of infection but we are going to have to clean this away in order to try and get things moving in an appropriate direction. I discussed  that with patient's  daughter today as well. She is in agreement with proceeding as such. 12/21/2020 upon evaluation today patient appears to be doing well with regard to her wound this did not seal off like it was with the collagen but nonetheless also did not really feeling quite as much as I would like to have seen. I do believe that we may need to go ahead and see about doing a debridement today and I really feel like we may want to switch back to the [facility] which I felt like was doing better in the past. Good news is the base of the wound appears healthy with good granulation tissue. 12/28/2020 upon evaluation patient's wound actually showed signs of doing quite well in regard to her foot. I think that we getting very close to complete resolution and overall I am extremely happy with where things stand today. 01/04/2021 upon evaluation today patient appears to be doing well with regard to her wound on the foot. This is good to require little bit of debridement today but overall seems to be doing quite well. I am actually very pleased with where we stand currently. 01/08/2021 upon evaluation today patient's wound actually showing signs of some improvement although she still has some callus buildup around the edges of the wound unfortunately I think this is continuing to rub despite what we are doing currently. I think that we may need to go ahead and see about switching up the dressing a little bit I Minna recommend collagen and then on top of the collagen we will get a use some foam double layered cut in a doughnut to try to take pressure off of this area we will see how this does over the next week. 01/15/2021 upon evaluation today patient's wound is actually showing signs of being completely dried over with the collagen. This is definitely not what we are looking for. I think that the wound dried out too quickly is the main issue that we are running into at this point and I really think we may need to do  something else try to keep that from occurring. 01/22/2021 upon evaluation today patient's wound actually did stay open it did not callus over as its been doing in the past this is good news. Also feel like it may not be quite as deep as what we have previously seen. There is still not as much improvement he does what I would like to see but nonetheless I think we are headed in the right direction. 01/29/2021 upon evaluation today patient appears to be doing okay in regard to her wound is not too dry but unfortunately is too wet the Xeroform just did not do what I was hoping that she made things a little bit worse as far as size wise I am actually going to try at this point considering this is more open but not as deep the Hydrofera Blue to see if this will be beneficial at this time. Again we seem to be cycling through things and it is becoming a little difficult to get this to completely close but in the past Hydrofera Blue is what she did the best with 1 week to keep Ebers, Mylan L. ([id number]) it from closing up prematurely. Objective Constitutional Well-nourished and well-hydrated in no acute distress. Vitals Time Taken: 11:47 AM, Height: 58 in, Weight: 137 lbs, BMI: 28.6, Temperature: 97.5 F, Pulse: 79 bpm, Respiratory Rate: 16 breaths/min, Blood Pressure: 142/74 mmHg. Respiratory normal breathing without difficulty.  Psychiatric this patient is able to make decisions and demonstrates good insight into disease process. Alert and Oriented x 3. pleasant and cooperative. General Notes: Upon inspection patient's wound bed actually showed signs of good granulation epithelization at this point. Fortunately I do not see any evidence of active infection locally nor systemically which is great news. She did have a little abrasion on the upper part of her leg but nothing too significant here. Integumentary (Hair, Skin) Wound #3 status is Open. Original cause of wound was Pressure Injury. The  date acquired was: 10/02/2020. The wound has been in treatment 17 weeks. The wound is located on the Left Foot. The wound measures 0.4cm length x 0.4cm width x 0.4cm depth; 0.126cm^2 area and 0.05cm^3 volume. There is Fat Layer (Subcutaneous Tissue) exposed. There is no tunneling or undermining noted. There is a medium amount of serous drainage noted. The wound margin is thickened. There is medium (34-66%) red, pink granulation within the wound bed. There is a medium (34- 66%) amount of necrotic tissue within the wound bed including Adherent Slough. Wound #4 status is Open. Original cause of wound was Trauma. The date acquired was: 01/27/2021. The wound is located on the Left Lower Leg. The wound measures 1.5cm length x 0.6cm width x 0.1cm depth; 0.707cm^2 area and 0.071cm^3 volume. There is Fat Layer (Subcutaneous Tissue) exposed. There is no tunneling or undermining noted. There is a large amount of sanguinous drainage noted. The wound margin is indistinct and nonvisible. There is medium (34-66%) granulation within the wound bed. There is a small (1-33%) amount of necrotic tissue within the wound bed including Eschar. Assessment Active Problems ICD-10 Pressure ulcer of other site, stage 3 Essential (primary) hypertension Vascular dementia without behavioral disturbance Atherosclerotic heart disease of native coronary artery without angina pectoris Procedures Wound #3 Pre-procedure diagnosis of Wound #3 is a Pressure Ulcer located on the Left Foot . There was a Excisional Skin/Subcutaneous Tissue Debridement with a total area of 0.16 sq cm performed by Tommie Sams., PA-C. With the following instrument(s): Curette to remove Viable and Non-Viable tissue/material. Material removed includes Subcutaneous Tissue and Slough and after achieving pain control using Lidocaine. A time out was conducted at 12:18, prior to the start of the procedure. A Minimum amount of bleeding was controlled with  Pressure. The procedure was tolerated well. Post Debridement Measurements: 0.4cm length x 0.4cm width x 0.4cm depth; 0.05cm^3 volume. Post debridement Stage noted as Category/Stage III. Character of Wound/Ulcer Post Debridement is improved. Post procedure Diagnosis Wound #3: Same as Pre-Procedure Pre-procedure diagnosis of Wound #3 is a Pressure Ulcer located on the Left Foot . There was a Three Layer Compression Therapy Procedure by Haye, Dierdra L. ([id number] Poag, RN. Post procedure Diagnosis Wound #3: Same as Pre-Procedure Plan Follow-up Appointments: Return Appointment in 1 week. Nurse Visit as needed Bathing/ Shower/ Hygiene: May shower with wound dressing protected with water repellent cover or cast protector. No tub bath. Anesthetic (Use 'Patient Medications' Section for Anesthetic Order Entry): Lidocaine applied to wound bed Edema Control - Lymphedema / Segmental Compressive Device / Other: Optional: One layer of unna paste to top of compression wrap (to act as an anchor). Patient to wear own compression stockings. Remove compression stockings every night before going to bed and put on every morning when getting up. - right leg Elevate legs to the level of the heart and pump ankles as often as possible Elevate leg(s) parallel to the floor when sitting. DO YOUR BEST to sleep  in the bed at night. DO NOT sleep in your recliner. Long hours of sitting in a recliner leads to swelling of the legs and/or potential wounds on your backside. Off-Loading: Open toe surgical shoe - left Other: - Offloading boots while in bed; Try pillow under sheet to keep it in place. Additional Orders / Instructions: Follow Nutritious Diet and Increase Protein Intake WOUND #3: - Foot Wound Laterality: Left Cleanser: Soap and Water 1 x Per Week/30 Days Discharge Instructions: Gently cleanse wound with antibacterial soap, rinse and pat dry prior to dressing wounds Primary Dressing: Hydrofera  Blue Ready Transfer Foam, 2.5x2.5 (in/in) 1 x Per Week/30 Days Discharge Instructions: ONY ON TOP of wound only-Apply Hydrofera Blue Ready to wound bed as directed Secondary Dressing: ABD Pad 5x9 (in/in) 1 x Per Week/30 Days Discharge Instructions: Cover with ABD pad Compression Wrap: Profore Lite LF 3 Multilayer Compression Bandaging System 1 x Per Week/30 Days Discharge Instructions: Apply 3 multi-layer wrap as prescribed. WOUND #4: - Lower Leg Wound Laterality: Left Cleanser: Soap and Water 1 x Per Week/30 Days Discharge Instructions: Gently cleanse wound with antibacterial soap, rinse and pat dry prior to dressing wounds Primary Dressing: Xeroform-HBD 2x2 (in/in) 1 x Per Week/30 Days Discharge Instructions: Apply Xeroform-HBD 2x2 (in/in) as directed Secondary Dressing: Zetuvit Plus Silicone Non-bordered 5x5 (in/in) 1 x Per Week/30 Days Compression Wrap: Profore Lite LF 3 Multilayer Compression Bandaging System 1 x Per Week/30 Days Discharge Instructions: Keep off of wound 2 Apply 3 multi-layer wrap as prescribed. 1. We will switch back to [facility] for the foot I think this will be good. We can use some Xeroform on the abrasion on top hopefully this will be healed by next week. 2. I am also can recommend that we go ahead and continue with the 3 layer compression wrap which I think is doing the best job for her. We will see patient back for reevaluation in 1 week here in the clinic. If anything worsens or changes patient will contact our office for additional recommendations. Electronic Signature(s) Signed: 01/29/2021 5:08:10 PM By: Worthy Keeler PA-C Entered By: Worthy Keeler on 01/29/2021 17:08:10 Puffenbarger, Makayle L. ([id number]) -------------------------------------------------------------------------------- SuperBill Details Patient Name: Hogue, Ela L. Date of Service: 01/29/2021 Medical Record Number: [id number] Patient Account Number: [id number] Date of Birth/Sex:  [birth year] (87 y.o. F) Treating RN: Donnamarie Poag Primary Care Provider: [name] Other Clinician: Referring Provider: [name] Treating Provider/Extender: Skipper Cliche in Treatment: 31 Diagnosis Coding ICD-10 Codes Code Description [id number] Pressure ulcer of other site, stage 3 I10 Essential (primary) hypertension F01.50 Vascular dementia without behavioral disturbance I25.10 Atherosclerotic heart disease of native coronary artery without angina pectoris Facility Procedures CPT4 Code: 48889169 Description: 45038 - DEB SUBQ TISSUE 20 SQ CM/< Modifier: Quantity: 1 CPT4 Code: Description: ICD-10 Diagnosis Description L89.893 Pressure ulcer of other site, stage 3 Modifier: Quantity: Physician Procedures CPT4 Code: 8828003 Description: 11042 - WC PHYS SUBQ TISS 20 SQ CM Modifier: Quantity: 1 CPT4 Code: Description: ICD-10 Diagnosis Description L89.893 Pressure ulcer of other site, stage 3 Modifier: Quantity: Electronic Signature(s) Signed: 01/29/2021 5:08:48 PM By: Worthy Keeler PA-C Previous Signature: 01/29/2021 4:15:42 PM Version By: Donnamarie Poag Entered By: Worthy Keeler on 01/29/2021 17:08:48

## 2021-01-29 NOTE — Progress Notes (Addendum)
Woodhams, Hildy L. ([id number]) Visit Report for 01/29/2021 Arrival Information Details Patient Name: [name] [name], [name] Date of Service: 01/29/2021 11:15 AM Medical Record Number: [id number] Patient Account Number: [id number] Date of Birth/Sex: 12/16/1926 (87 y.o. F) Treating RN: Cornell Barman Primary Care Brenisha Tsui: [name] Other Clinician: Referring Yohann Curl: [name] Treating Kristyana Notte/Extender: Skipper Cliche in Treatment: 66 Visit Information History Since Last Visit Has Dressing in Place as Prescribed: Yes Patient Arrived: Wheel Chair Pain Present Now: No Arrival Time: 11:43 Accompanied By: son Transfer Assistance: Manual Patient Identification Verified: Yes Secondary Verification Process Completed: Yes Patient Requires Transmission-Based No Precautions: Patient Has Alerts: Yes Patient Alerts: NOT diabetic ABI R1.18 L1.25 06/27/20 Electronic Signature(s) Signed: 01/29/2021 5:36:28 PM By: Gretta Cool, BSN, RN, CWS, Kim RN, BSN Entered By: Gretta Cool, BSN, RN, CWS, Kim on 01/29/2021 11:47:44 Bartko, Soraida L. ([id number]) -------------------------------------------------------------------------------- Compression Therapy Details Patient Name: [name] [name], [name] Date of Service: 01/29/2021 11:15 AM Medical Record Number: [id number] Patient Account Number: [id number] Date of Birth/Sex: [birth year] (87 y.o. F) Treating RN: Donnamarie Poag Primary Care Emmalea Treanor: [name] Other Clinician: Referring Viggo Perko: [name] Treating Kadedra Vanaken/Extender: Skipper Cliche in Treatment: 31 Compression Therapy Performed for Wound Assessment: Wound #3 Left Foot Performed By: Clinician Donnamarie Poag, RN Compression Type: Three Layer Post Procedure Diagnosis Same as Pre-procedure Electronic Signature(s) Signed: 01/29/2021 4:15:42 PM By: Donnamarie Poag Entered By: Donnamarie Poag on 01/29/2021 12:04:43 Schmall, [place]  ([id number]) -------------------------------------------------------------------------------- Encounter Discharge Information Details Patient Name: [name] [name], [name] Date of Service: 01/29/2021 11:15 AM Medical Record Number: [id number] Patient Account Number: [id number] Date of Birth/Sex: [birth year] (87 y.o. F) Treating RN: Donnamarie Poag Primary Care Itali Mckendry: [name] Other Clinician: Referring Shawan Tosh: [name] Treating Dontrelle Mazon/Extender: Skipper Cliche in Treatment: 31 Encounter Discharge Information Items Post Procedure Vitals Discharge Condition: Stable Temperature (F): 97.5 Ambulatory Status: Wheelchair Pulse (bpm): 79 Discharge Destination: Home Respiratory Rate (breaths/min): 16 Transportation: Private Auto Blood Pressure (mmHg): 142/74 Accompanied By: son Schedule Follow-up Appointment: Yes Clinical Summary of Care: Electronic Signature(s) Signed: 01/29/2021 4:15:42 PM By: Donnamarie Poag Entered ByDonnamarie Poag on 01/29/2021 12:30:17 Viscardi, Skyra L. ([id number]) -------------------------------------------------------------------------------- Lower Extremity Assessment Details Patient Name: [name] [name], [name] Date of Service: 01/29/2021 11:15 AM Medical Record Number: [id number] Patient Account Number: [id number] Date of Birth/Sex: 03/03/1926 (87 y.o. F) Treating RN: Cornell Barman Primary Care Orly Quimby: [name] Other Clinician: Referring Wiletta Bermingham: [name] Treating Gloris Shiroma/Extender: Skipper Cliche in Treatment: 31 Edema Assessment Assessed: [Left: No] Patrice Paradise: No] [Left: Edema] [Right: :] Calf Left: Right: Point of Measurement: 32 cm From Medial Instep 30 cm Ankle Left: Right: Point of Measurement: 12 cm From Medial Instep 21 cm Vascular Assessment Pulses: Dorsalis Pedis Palpable: [Left:Yes] Posterior Tibial Palpable: [Left:Yes] Electronic Signature(s) Signed: 01/29/2021 5:36:28 PM By: Gretta Cool, BSN, RN, CWS, Kim RN, BSN Entered By:  Gretta Cool, BSN, RN, CWS, Kim on 01/29/2021 12:00:46 Stark, Arbell L. ([id number]) -------------------------------------------------------------------------------- Multi Wound Chart Details Patient Name: [name] [name], [name] Date of Service: 01/29/2021 11:15 AM Medical Record Number: [id number] Patient Account Number: [id number] Date of Birth/Sex: 05-18-1926 (87 y.o. F) Treating RN: Donnamarie Poag Primary Care Shakiara Lukic: [name] Other Clinician: Referring Jozeph Persing: [name] Treating Mandy Peeks/Extender: Skipper Cliche in Treatment: 31 Vital Signs Height(in): 58 Pulse(bpm): 5 Weight(lbs): 137 Blood Pressure(mmHg): 142/74 Body Mass Index(BMI): 29 Temperature(F): 97.5 Respiratory Rate(breaths/min): 16 Photos: [3:No Photos] [4:No Photos] [N/A:N/A] Wound Location: [3:Left Foot] [4:Left Lower Leg] [N/A:N/A] Wounding Event: [3:Pressure Injury] [4:Trauma] [N/A:N/A] Primary Etiology: [3:Pressure Ulcer] [4:Trauma, Other] [N/A:N/A] Comorbid History: [3:Cataracts, Anemia, Coronary Artery  Disease, Hypertension, Myocardial Infarction, End Stage Renal Disease, Gout, Osteoarthritis, Dementia] [4:Cataracts, Anemia, Coronary Artery Disease, Hypertension, Myocardial Infarction, End Stage  Renal Disease, Gout, Osteoarthritis, Dementia] [N/A:N/A] Date Acquired: [3:10/02/2020] [4:01/27/2021] [N/A:N/A] Weeks of Treatment: [3:17] [4:0] [N/A:N/A] Wound Status: [3:Open] [4:Open] [N/A:N/A] Measurements L x W x D (cm) [3:0.4x0.4x0.4] [4:1.5x0.6x0.1] [N/A:N/A] Area (cm) : [3:0.126] [9:5.621] [N/A:N/A] Volume (cm) : [3:0.05] [4:0.071] [N/A:N/A] % Reduction in Area: [3:59.90%] [4:0.00%] [N/A:N/A] % Reduction in Volume: [3:-61.30%] [4:0.00%] [N/A:N/A] Classification: [3:Category/Stage III] [4:Full Thickness Without Exposed Support Structures] [N/A:N/A] Exudate Amount: [3:Medium] [4:Large] [N/A:N/A] Exudate Type: [3:Serous] [4:Sanguinous] [N/A:N/A] Exudate Color: [3:amber] [4:red] [N/A:N/A] Wound Margin:  [3:Thickened] [4:Indistinct, nonvisible] [N/A:N/A] Granulation Amount: [3:Medium (34-66%)] [4:Medium (34-66%)] [N/A:N/A] Granulation Quality: [3:Red, Pink] [4:N/A] [N/A:N/A] Necrotic Amount: [3:Medium (34-66%)] [4:Small (1-33%)] [N/A:N/A] Necrotic Tissue: [3:Adherent Slough] [4:Eschar] [N/A:N/A] Exposed Structures: [3:Fat Layer (Subcutaneous Tissue): Yes Fascia: No Tendon: No Muscle: No Joint: No Bone: No None] [4:Fat Layer (Subcutaneous Tissue): Yes Fascia: No Tendon: No Muscle: No Joint: No Bone: No None] [N/A:N/A N/A] Treatment Notes Electronic Signature(s) Signed: 01/29/2021 4:15:42 PM By: Donnamarie Poag Entered ByDonnamarie Poag on 01/29/2021 12:04:09 [name] [name], [name] ([id number]) -------------------------------------------------------------------------------- Willard Details Patient Name: [name] [name], [name] Date of Service: 01/29/2021 11:15 AM Medical Record Number: [id number] Patient Account Number: [id number] Date of Birth/Sex: 07-15-1926 (87 y.o. F) Treating RN: Donnamarie Poag Primary Care Tonna Palazzi: [name] Other Clinician: Referring [place] Kinyon: [name] Treating Rexanna Louthan/Extender: Skipper Cliche in Treatment: 31 Active Inactive Electronic Signature(s) Signed: 01/29/2021 4:15:42 PM By: Donnamarie Poag Entered By: Donnamarie Poag on 01/29/2021 12:03:46 [name] [name], [name] ([id number]) -------------------------------------------------------------------------------- Pain Assessment Details Patient Name: [name] [name], [name] Date of Service: 01/29/2021 11:15 AM Medical Record Number: [id number] Patient Account Number: [id number] Date of Birth/Sex: [birth year] (87 y.o. F) Treating RN: Cornell Barman Primary Care Browning [place]: [name] Other Clinician: Referring Imir Brumbach: [name] Treating Paeton Latouche/Extender: Skipper Cliche in Treatment: 31 Active Problems Location of Pain Severity and Description of Pain Patient Has Paino No Site Locations Pain  Management and Medication Current Pain Management: Notes Patient denies pain at this time. Electronic Signature(s) Signed: 01/29/2021 5:36:28 PM By: Gretta Cool, BSN, RN, CWS, Kim RN, BSN Entered By: Gretta Cool, BSN, RN, CWS, Kim on 01/29/2021 11:48:42 [name] [name], [name] [name] ([id number]) -------------------------------------------------------------------------------- Patient/Caregiver Education Details Patient Name: [name] [name], [name] Date of Service: 01/29/2021 11:15 AM Medical Record Number: [id number] Patient Account Number: [id number] Date of Birth/Gender: 1926-11-06 (87 y.o. F) Treating RN: Donnamarie Poag Primary Care Physician: [name] Other Clinician: Referring Physician: [name] Treating Physician/Extender: Skipper Cliche in Treatment: 70 Education Assessment Education Provided To: Patient Education Topics Provided Basic Hygiene: Wound Debridement: Wound/Skin Impairment: Electronic Signature(s) Signed: 01/29/2021 4:15:42 PM By: Donnamarie Poag Entered By: Donnamarie Poag on 01/29/2021 12:29:29 [name] [name], [name] ([id number]) -------------------------------------------------------------------------------- Wound Assessment Details Patient Name: [name] [name], Marketia L. Date of Service: 01/29/2021 11:15 AM Medical Record Number: [id number] Patient Account Number: [id number] Date of Birth/Sex: 10-May-1926 (87 y.o. F) Treating RN: Cornell Barman Primary Care Shontia Gillooly: [name] Other Clinician: Referring Tenee Wish: [name] Treating Cortlynn Hollinsworth/Extender: Skipper Cliche in Treatment: 31 Wound Status Wound Number: 3 Primary Pressure Ulcer Etiology: Wound Location: Left Foot Wound Open Wounding Event: Pressure Injury Status: Date Acquired: 10/02/2020 Comorbid Cataracts, Anemia, Coronary Artery Disease, Weeks Of Treatment: 17 History: Hypertension, Myocardial Infarction, End Stage Renal Clustered Wound: No Disease, Gout, Osteoarthritis, Dementia Photos Photo Uploaded By: Gretta Cool,  BSN, RN, CWS, Kim on 01/29/2021 12:05:01 Wound Measurements Length: (cm) 0.4 Width: (cm) 0.4 Depth: (cm) 0.4 Area: (cm) 0.126 Volume: (  cm) 0.05 % Reduction in Area: 59.9% % Reduction in Volume: -61.3% Epithelialization: None Tunneling: No Undermining: No Wound Description Classification: Category/Stage III Wound Margin: Thickened Exudate Amount: Medium Exudate Type: Serous Exudate Color: amber Foul Odor After Cleansing: No Slough/Fibrino Yes Wound Bed Granulation Amount: Medium (34-66%) Exposed Structure Granulation Quality: Red, Pink Fascia Exposed: No Necrotic Amount: Medium (34-66%) Fat Layer (Subcutaneous Tissue) Exposed: Yes Necrotic Quality: Adherent Slough Tendon Exposed: No Muscle Exposed: No Joint Exposed: No Bone Exposed: No Treatment Notes Wound #3 (Foot) Wound Laterality: Left Cleanser Soap and Water Discharge Instruction: Gently cleanse wound with antibacterial soap, rinse and pat dry prior to dressing wounds Marcantel, Yesenia L. ([id number]) Peri-Wound Care Topical Primary Dressing Hydrofera Blue Ready Transfer Foam, 2.5x2.5 (in/in) Discharge Instruction: ONY ON TOP of wound only-Apply Hydrofera Blue Ready to wound bed as directed Secondary Dressing ABD Pad 5x9 (in/in) Discharge Instruction: Cover with ABD pad Secured With Compression Wrap Profore Lite LF 3 Multilayer Compression [place] Discharge Instruction: Apply 3 multi-layer wrap as prescribed. Compression Stockings Add-Ons Electronic Signature(s) Signed: 01/29/2021 5:36:28 PM By: Gretta Cool, BSN, RN, CWS, Kim RN, BSN Entered By: Gretta Cool, BSN, RN, CWS, Kim on 01/29/2021 11:58:07 Reinheimer, Breda L. ([id number]) -------------------------------------------------------------------------------- Wound Assessment Details Patient Name: Goral, Lysa L. Date of Service: 01/29/2021 11:15 AM Medical Record Number: [id number] Patient Account Number: [id number] Date of Birth/Sex: [birth year] (87 y.o.  F) Treating RN: Cornell Barman Primary Care Vanette Noguchi: [name] Other Clinician: Referring Celestine Prim: [name] Treating Wilkes Potvin/Extender: Skipper Cliche in Treatment: 31 Wound Status Wound Number: 4 Primary Trauma, Other Etiology: Wound Location: Left Lower Leg Wound Open Wounding Event: Trauma Status: Date Acquired: 01/27/2021 Comorbid Cataracts, Anemia, Coronary Artery Disease, Weeks Of Treatment: 0 History: Hypertension, Myocardial Infarction, End Stage Renal Clustered Wound: No Disease, Gout, Osteoarthritis, Dementia Photos Photo Uploaded By: Gretta Cool, BSN, RN, CWS, Kim on 01/29/2021 12:05:01 Wound Measurements Length: (cm) 1.5 Width: (cm) 0.6 Depth: (cm) 0.1 Area: (cm) 0.707 Volume: (cm) 0.071 % Reduction in Area: 0% % Reduction in Volume: 0% Epithelialization: None Tunneling: No Undermining: No Wound Description Classification: Full Thickness Without Exposed Support Structures Wound Margin: Indistinct, nonvisible Exudate Amount: Large Exudate Type: Sanguinous Exudate Color: red Foul Odor After Cleansing: No Slough/Fibrino No Wound Bed Granulation Amount: Medium (34-66%) Exposed Structure Necrotic Amount: Small (1-33%) Fascia Exposed: No Necrotic Quality: Eschar Fat Layer (Subcutaneous Tissue) Exposed: Yes Tendon Exposed: No Muscle Exposed: No Joint Exposed: No Bone Exposed: No Treatment Notes Wound #4 (Lower Leg) Wound Laterality: Left Cleanser Soap and Water Discharge Instruction: Gently cleanse wound with antibacterial soap, rinse and pat dry prior to dressing wounds Ware, Ovie L. ([id number]) Peri-Wound Care Topical Primary Dressing Xeroform-HBD 2x2 (in/in) Discharge Instruction: Apply Xeroform-HBD 2x2 (in/in) as directed Secondary Dressing Zetuvit Plus Silicone Non-bordered 5x5 (in/in) Secured With Compression Wrap Profore Lite LF 3 Multilayer Compression Bandaging System Discharge Instruction: Keep off of wound 2 Apply 3  multi-layer wrap as prescribed. Compression Stockings Add-Ons Electronic Signature(s) Signed: 01/29/2021 5:36:28 PM By: Gretta Cool, BSN, RN, CWS, Kim RN, BSN Entered By: Gretta Cool, BSN, RN, CWS, Kim on 01/29/2021 11:59:31 Menchaca, Imogean L. ([id number]) -------------------------------------------------------------------------------- Vitals Details Patient Name: Paulsen, Eliane L. Date of Service: 01/29/2021 11:15 AM Medical Record Number: [id number] Patient Account Number: [id number] Date of Birth/Sex: December 25, 1926 (87 y.o. F) Treating RN: Cornell Barman Primary Care Yasira Engelson: [name] Other Clinician: Referring Alija Riano: [name] Treating Priscille Shadduck/Extender: Skipper Cliche in Treatment: 31 Vital Signs Time Taken: 11:47 Temperature (F): 97.5 Height (in): 58 Pulse (bpm): 79 Weight (lbs): 137  Respiratory Rate (breaths/min): 16 Body Mass Index (BMI): 28.6 Blood Pressure (mmHg): 142/74 Reference Range: 80 - 120 mg / dl Electronic Signature(s) Signed: 01/29/2021 5:36:28 PM By: Gretta Cool, BSN, RN, CWS, Kim RN, BSN Entered By: Gretta Cool, BSN, RN, CWS, Kim on 01/29/2021 11:48:17

## 2021-02-05 ENCOUNTER — Other Ambulatory Visit: Payer: Self-pay

## 2021-02-05 ENCOUNTER — Encounter: Payer: PPO | Admitting: Physician Assistant

## 2021-02-05 DIAGNOSIS — L89893 Pressure ulcer of other site, stage 3: Secondary | ICD-10-CM | POA: Diagnosis not present

## 2021-02-05 NOTE — Progress Notes (Addendum)
Southers, Bowen L. ([id number]) Visit Report for 02/05/2021 Arrival Information Details Patient Name: [name] [name] BOSKO. Date of Service: 02/05/2021 11:15 AM Medical Record Number: [id number] Patient Account Number: [id number] Date of Birth/Sex: [birth year] (87 y.o. F) Treating RN: Donnamarie Poag Primary Care Tane Biegler: [name] Other Clinician: Referring Clorine Swing: [name] Treating Arlando Leisinger/Extender: Skipper Cliche in Treatment: 49 Visit Information History Since Last Visit Added or deleted any medications: No Patient Arrived: Wheel Chair Had a fall or experienced change in No Arrival Time: 11:52 activities of daily living that may affect Accompanied By: daughter risk of falls: Transfer Assistance: EasyP[employer] Patient Lift Hospitalized since last visit: No Patient Identification Verified: Yes Has Dressing in Place as Prescribed: Yes Secondary Verification Process Completed: Yes Has Compression in Place as Prescribed: Yes Patient Requires Transmission-Based No Pain Present Now: No Precautions: Patient Has Alerts: Yes Patient Alerts: NOT diabetic ABI R1.18 L1.25 06/27/20 Electronic Signature(s) Signed: 02/05/2021 4:09:47 PM By: Donnamarie Poag Entered ByDonnamarie Poag on 02/05/2021 11:55:10 [name] [name] L. ([id number]) -------------------------------------------------------------------------------- Encounter Discharge Information Details Patient Name: [name] [name] L. Date of Service: 02/05/2021 11:15 AM Medical Record Number: [id number] Patient Account Number: [id number] Date of Birth/Sex: 1926/04/30 (87 y.o. F) Treating RN: Donnamarie Poag Primary Care Lacy Taglieri: [name] Other Clinician: Referring Korbin Mapps: [name] Treating Aadarsh Cozort/Extender: Skipper Cliche in Treatment: 32 Encounter Discharge Information Items Post Procedure Vitals Discharge Condition: Stable Temperature (F): 97.3 Ambulatory Status: Wheelchair Pulse (bpm): 74 Discharge Destination:  Home Respiratory Rate (breaths/min): 16 Transportation: Private Auto Blood Pressure (mmHg): 122/76 Accompanied By: daughter Schedule Follow-up Appointment: Yes Clinical Summary of Care: Electronic Signature(s) Signed: 02/05/2021 4:09:47 PM By: Donnamarie Poag Entered ByDonnamarie Poag on 02/05/2021 12:16:23 [name] [name] L. ([id number]) -------------------------------------------------------------------------------- Lower Extremity Assessment Details Patient Name: [name] [name] L. Date of Service: 02/05/2021 11:15 AM Medical Record Number: [id number] Patient Account Number: [id number] Date of Birth/Sex: [birth year] (87 y.o. F) Treating RN: Donnamarie Poag Primary Care [place]: [name] Other Clinician: Referring Aleck Locklin: [name] Treating Jalexa Pifer/Extender: Skipper Cliche in Treatment: 32 Edema Assessment Assessed: [Left: Yes] Patrice Paradise: No] [Left: Edema] [Right: :] Calf Left: Right: Point of Measurement: 32 cm From Medial Instep 31 cm Ankle Left: Right: Point of Measurement: 12 cm From Medial Instep 21 cm Vascular Assessment Pulses: Dorsalis Pedis Palpable: [Left:Yes] Electronic Signature(s) Signed: 02/05/2021 4:09:47 PM By: Donnamarie Poag Entered ByDonnamarie Poag on 02/05/2021 12:05:40 Roarty, Kalandra L. ([id number]) -------------------------------------------------------------------------------- Multi Wound Chart Details Patient Name: [name] [name] L. Date of Service: 02/05/2021 11:15 AM Medical Record Number: [id number] Patient Account Number: [id number] Date of Birth/Sex: 1926/05/04 (87 y.o. F) Treating RN: Donnamarie Poag Primary Care Jairen Goldfarb: [name] Other Clinician: Referring Geraldyne Barraclough: [name] Treating Ison Wichmann/Extender: Skipper Cliche in Treatment: 32 Vital Signs Height(in): 74 Pulse(bpm): 97 Weight(lbs): 137 Blood Pressure(mmHg): 122/76 Body Mass Index(BMI): 29 Temperature(F): 97.8 Respiratory Rate(breaths/min): 16 Photos:  [N/A:N/A] Wound Location: Left Foot Left Lower Leg N/A Wounding Event: Pressure Injury Trauma N/A Primary Etiology: Pressure Ulcer Trauma, Other N/A Comorbid History: Cataracts, Anemia, Coronary Artery Cataracts, Anemia, Coronary Artery N/A Disease, Hypertension, Myocardial Disease, Hypertension, Myocardial Infarction, End Stage Renal Infarction, End Stage Renal Disease, Gout, Osteoarthritis, Disease, Gout, Osteoarthritis, Dementia Dementia Date Acquired: 10/02/2020 01/27/2021 N/A Weeks of Treatment: 18 1 N/A Wound Status: Open Open N/A Measurements L x W x D (cm) 0.5x0.5x0.3 0.3x0.2x0.1 N/A Area (cm) : 0.196 0.047 N/A Volume (cm) : 0.059 0.005 N/A % Reduction in Area: 37.60% 93.40% N/A % Reduction in Volume: -90.30% 93.00% N/A Classification: Category/Stage III Full Thickness Without  Exposed N/A Support Structures Exudate Amount: Medium None Present N/A Exudate Type: Serous N/A N/A Exudate Color: amber N/A N/A Wound Margin: Thickened Indistinct, nonvisible N/A Granulation Amount: Medium (34-66%) None Present (0%) N/A Granulation Quality: Red, Pink N/A N/A Necrotic Amount: Medium (34-66%) Large (67-100%) N/A Necrotic Tissue: Adherent Slough Eschar N/A Exposed Structures: Fat Layer (Subcutaneous Tissue): Fat Layer (Subcutaneous Tissue): N/A Yes Yes Fascia: No Fascia: No Tendon: No Tendon: No Muscle: No Muscle: No Joint: No Joint: No Bone: No Bone: No Epithelialization: None None N/A Treatment Notes Electronic Signature(s) Signed: 02/05/2021 4:09:47 PM By: Donnamarie Poag Entered By: Donnamarie Poag on 02/05/2021 12:06:13 [name] [name] [place] ([id number]) [name] [name] L. ([id number]) -------------------------------------------------------------------------------- [place] Details Patient Name: [name] [name] L. Date of Service: 02/05/2021 11:15 AM Medical Record Number: [id number] Patient Account Number: [id number] Date of Birth/Sex: 02/14/1927 (87 y.o.  F) Treating RN: Donnamarie Poag Primary Care Rashod Gougeon: [name] Other Clinician: Referring Cai Flott: [name] Treating Tauri Ethington/Extender: Skipper Cliche in Treatment: 32 Active Inactive Electronic Signature(s) Signed: 02/05/2021 4:09:47 PM By: Donnamarie Poag Entered By: Donnamarie Poag on 02/05/2021 12:06:02 [name] [name] L. ([id number]) -------------------------------------------------------------------------------- Pain Assessment Details Patient Name: Ilic, Daaiyah L. Date of Service: 02/05/2021 11:15 AM Medical Record Number: [id number] Patient Account Number: [id number] Date of Birth/Sex: 21-Dec-1926 (87 y.o. F) Treating RN: Donnamarie Poag Primary Care Eleasha Cataldo: [name] Other Clinician: Referring Tilak Oakley: [name] Treating Terrica Duecker/Extender: Skipper Cliche in Treatment: 32 Active Problems Location of Pain Severity and Description of Pain Patient Has Paino No Site Locations Rate the pain. Current Pain Level: 0 Pain Management and Medication Current Pain Management: Electronic Signature(s) Signed: 02/05/2021 4:09:47 PM By: Donnamarie Poag Entered By: Donnamarie Poag on 02/05/2021 11:55:35 [name] [name] L. ([id number]) -------------------------------------------------------------------------------- Patient/Caregiver Education Details Patient Name: [name] [name] L. Date of Service: 02/05/2021 11:15 AM Medical Record Number: [id number] Patient Account Number: [id number] Date of Birth/Gender: 1926/11/28 (87 y.o. F) Treating RN: Donnamarie Poag Primary Care Physician: [name] Other Clinician: Referring Physician: [name] Treating Physician/Extender: Skipper Cliche in Treatment: 53 Education Assessment Education Provided To: Patient Education Topics Provided Basic Hygiene: Wound Debridement: Wound/Skin Impairment: Electronic Signature(s) Signed: 02/05/2021 4:09:47 PM By: Donnamarie Poag Entered By: Donnamarie Poag on 02/05/2021 12:14:34 Akhtar,  Shanai L. ([id number]) -------------------------------------------------------------------------------- Wound Assessment Details Patient Name: Sandberg, Hevin L. Date of Service: 02/05/2021 11:15 AM Medical Record Number: [id number] Patient Account Number: [id number] Date of Birth/Sex: [birth year] (87 y.o. F) Treating RN: Donnamarie Poag Primary Care Clella Mckeel: [name] Other Clinician: Referring Harman Ferrin: [name] Treating Bengie Kaucher/Extender: Skipper Cliche in Treatment: 32 Wound Status Wound Number: 3 Primary Pressure Ulcer Etiology: Wound Location: Left Foot Wound Open Wounding Event: Pressure Injury Status: Date Acquired: 10/02/2020 Comorbid Cataracts, Anemia, Coronary Artery Disease, Weeks Of Treatment: 18 History: Hypertension, Myocardial Infarction, End Stage Renal Clustered Wound: No Disease, Gout, Osteoarthritis, Dementia Photos Wound Measurements Length: (cm) 0.5 Width: (cm) 0.5 Depth: (cm) 0.3 Area: (cm) 0.196 Volume: (cm) 0.059 % Reduction in Area: 37.6% % Reduction in Volume: -90.3% Epithelialization: None Tunneling: No Undermining: No Wound Description Classification: Category/Stage III Wound Margin: Thickened Exudate Amount: Medium Exudate Type: Serous Exudate Color: amber Foul Odor After Cleansing: No Slough/Fibrino Yes Wound Bed Granulation Amount: Medium (34-66%) Exposed Structure Granulation Quality: Red, Pink Fascia Exposed: No Necrotic Amount: Medium (34-66%) Fat Layer (Subcutaneous Tissue) Exposed: Yes Necrotic Quality: Adherent Slough Tendon Exposed: No Muscle Exposed: No Joint Exposed: No Bone Exposed: No Treatment Notes Wound #3 (Foot) Wound Laterality: Left Cleanser Soap and Water Discharge Instruction: Gently cleanse  wound with antibacterial soap, rinse and pat dry prior to dressing wounds Bondy, Kyilee L. ([id number]) Peri-Wound Care Topical Primary Dressing Hydrofera Blue Ready Transfer Foam, 2.5x2.5  (in/in) Discharge Instruction: ONY ON TOP of wound only-Apply Hydrofera Blue Ready to wound bed as directed Secondary Dressing Zetuvit Plus Silicone Border Dressing 4x4 (in/in) Secured With Tubigrip Size D, 3x10 (in/yd) Compression Wrap Compression Stockings Add-Ons Electronic Signature(s) Signed: 02/05/2021 4:09:47 PM By: Donnamarie Poag Entered ByDonnamarie Poag on 02/05/2021 12:02:22 Wittke, [place] ([id number]) -------------------------------------------------------------------------------- Wound Assessment Details Patient Name: Katzenberger, Climmie L. Date of Service: 02/05/2021 11:15 AM Medical Record Number: [id number] Patient Account Number: [id number] Date of Birth/Sex: [birth year] (87 y.o. F) Treating RN: Donnamarie Poag Primary Care Jeanice Dempsey: [name] Other Clinician: Referring Benn Tarver: [name] Treating Willa Brocks/Extender: Skipper Cliche in Treatment: 32 Wound Status Wound Number: 4 Primary Trauma, Other Etiology: Wound Location: Left Lower Leg Wound Healed - Epithelialized Wounding Event: Trauma Status: Date Acquired: 01/27/2021 Comorbid Cataracts, Anemia, Coronary Artery Disease, Weeks Of Treatment: 1 History: Hypertension, Myocardial Infarction, End Stage Renal Clustered Wound: No Disease, Gout, Osteoarthritis, Dementia Photos Wound Measurements Length: (cm) 0 Width: (cm) 0 Depth: (cm) 0 Area: (cm) Volume: (cm) % Reduction in Area: 100% % Reduction in Volume: 100% Epithelialization: None 0 Tunneling: No 0 Undermining: No Wound Description Classification: Full Thickness Without Exposed Support Structures Wound Margin: Indistinct, nonvisible Exudate Amount: None Present Foul Odor After Cleansing: No Slough/Fibrino Yes Wound Bed Granulation Amount: None Present (0%) Exposed Structure Necrotic Amount: Large (67-100%) Fascia Exposed: No Necrotic Quality: Eschar Fat Layer (Subcutaneous Tissue) Exposed: No Tendon Exposed: No Muscle Exposed:  No Joint Exposed: No Bone Exposed: No Electronic Signature(s) Signed: 02/05/2021 4:09:47 PM By: Donnamarie Poag Entered ByDonnamarie Poag on 02/05/2021 12:11:12 Winthrop, Ruffin ([id number]) -------------------------------------------------------------------------------- Vitals Details Patient Name: Depner, Quadasia L. Date of Service: 02/05/2021 11:15 AM Medical Record Number: [id number] Patient Account Number: [id number] Date of Birth/Sex: 1926-06-24 (87 y.o. F) Treating RN: Donnamarie Poag Primary Care Jermarcus Mcfadyen: [name] Other Clinician: Referring Aidaly Cordner: [name] Treating Taneasha Fuqua/Extender: Skipper Cliche in Treatment: 32 Vital Signs Time Taken: 11:54 Temperature (F): 97.8 Height (in): 58 Pulse (bpm): 74 Weight (lbs): 137 Respiratory Rate (breaths/min): 16 Body Mass Index (BMI): 28.6 Blood Pressure (mmHg): 122/76 Reference Range: 80 - 120 mg / dl Electronic Signature(s) Signed: 02/05/2021 4:09:47 PM By: Donnamarie Poag Entered ByDonnamarie Poag on 02/05/2021 11:55:27

## 2021-02-05 NOTE — Progress Notes (Addendum)
Rondon, Alicea L. ([id number]) Visit Report for 02/05/2021 Chief Complaint Document Details Patient Name: [name], [name] Date of Service: 02/05/2021 11:15 AM Medical Record Number: [id number] Patient Account Number: [id number] Date of Birth/Sex: [birth year] (87 y.o. F) Treating RN: Donnamarie Poag Primary Care Provider: [name] Other Clinician: Referring Provider: [name] Treating Provider/Extender: Skipper Cliche in Treatment: 25 Information Obtained from: Patient Chief Complaint Left Heel Pressure Ulcer Electronic Signature(s) Signed: 02/05/2021 11:40:10 AM By: Worthy Keeler PA-C Entered By: Worthy Keeler on 02/05/2021 11:40:10 Codner, Shereen L. ([id number]) -------------------------------------------------------------------------------- Debridement Details Patient Name: [name], [name] Date of Service: 02/05/2021 11:15 AM Medical Record Number: [id number] Patient Account Number: [id number] Date of Birth/Sex: 1926/02/24 (87 y.o. F) Treating RN: Donnamarie Poag Primary Care Provider: [name] Other Clinician: Referring Provider: [name] Treating Provider/Extender: Skipper Cliche in Treatment: 32 Debridement Performed for Wound #3 Left Foot Assessment: Performed By: Physician Tommie Sams., PA-C Debridement Type: Debridement Level of Consciousness (Pre- Awake and Alert procedure): Pre-procedure Verification/Time Out Yes - 12:09 Taken: Start Time: 12:10 Pain Control: Lidocaine Total Area Debrided (L x W): 0.5 (cm) x 0.52 (cm) = 0.26 (cm) Tissue and other material Viable, Non-Viable, Slough, Subcutaneous, Slough debrided: Level: Skin/Subcutaneous Tissue Debridement Description: Excisional Instrument: Curette Bleeding: Minimum Hemostasis Achieved: Pressure Response to Treatment: Procedure was tolerated well Level of Consciousness (Post- Awake and Alert procedure): Post Debridement Measurements of Total Wound Length: (cm)  0.5 Stage: Category/Stage III Width: (cm) 0.5 Depth: (cm) 0.3 Volume: (cm) 0.059 Character of Wound/Ulcer Post Debridement: Improved Post Procedure Diagnosis Same as Pre-procedure Electronic Signature(s) Signed: 02/05/2021 4:09:47 PM By: Donnamarie Poag Signed: 02/05/2021 4:40:54 PM By: Worthy Keeler PA-C Entered By: Donnamarie Poag on 02/05/2021 12:12:43 [name], [name] ([id number]) -------------------------------------------------------------------------------- HPI Details Patient Name: [name], [name] Date of Service: 02/05/2021 11:15 AM Medical Record Number: [id number] Patient Account Number: [id number] Date of Birth/Sex: 31-Aug-1926 (87 y.o. F) Treating RN: Donnamarie Poag Primary Care Provider: [name] Other Clinician: Referring Provider: [name] Treating Provider/Extender: Skipper Cliche in Treatment: 56 History of Present Illness HPI Description: 87 year old patient was recently seen by the PCPs office for significant pain right great toe which has been going on since July. She was initially treated with Keflex which she did not complete and after the office visit this time she has been put on doxycycline. at the time of her visit she was found to have a ulcer on the plantar surface of the right great toe and also had a pyogenic granuloma over this area. X-ray of the right foot done 12/29/2014 -- IMPRESSION:Soft-tissue swelling and ulceration right great toe. No underlying bony lytic lesion identified. If osteomyelitis remains of clinical concern MRI can be obtained. Past medical history significant for anemia, chronic kidney disease stage III, obesity, varicose veins, coronary artery disease, gout, history of nicotine addiction given up smoking in 2002, hypertension, status post cardiac catheterization, status post abdominal hysterectomy, cholecystectomy and tonsillectomy. hemoglobin A1c done in August was 5.8 01/22/2015 -- at this stage the [facility] Walking boat was  going to cost them significant amount of money and they want to defer using that at the present time. 01/29/2015 -- she had a podiatry appointment and they have trimmed her toenails. She has not heard back from the vascular office regarding her venous duplex study and I have asked them to call personally so that they can get the appointment soon. 02/06/2015 -- they have made contact with the vascular office and from what I understand  that test has been done but the report is pending. 02/19/2014 -- the vascular test is scheduled for tomorrow Readmission: 06/26/2020 upon evaluation today patient appears for initial evaluation in her clinic that she has been here before in 2016 and has been quite sometime. She did have a fractured hip in February on the 23rd 2022. Subsequently this had to be pinned and she ended up with a pressure injury on her heel following when she was using her foot to help move her around in the bed. Subsequently this has led to the wound that she has been dealing with since that time. She lives at home with her daughter currently she does have dementia. 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 07/03/2020 upon evaluation today patient appears to be doing well with regard to her heel ulcer. She did have arterial studies they appear to be doing excellent it was premature normal across the board with TBI's in the 90s and ABIs well within normal range. Nonetheless there does not appear to be any signs of arterial insufficiency whatsoever. With that being said the patient does have also signs of improvement there is some necrotic tissue in the base of the wound number to try to clear some of that away today I do believe the Iodoflex/Iodosorb is doing well. 5/24; difficult punched out wound on the left medial heel. She has been using Iodoflex 07/17/2020  upon evaluation today patient appears to be doing well at this point in regard to her wound. I do feel like this is a little bit deeper but again that is what is expected as we continue with the Iodoflex I think that this is just going to get deeper until we get to the base of the wound. With that being said I think we are getting much closer to the base of the wound where we can have healthier tissue that we get a be managing here which that will be awesome. In the meantime I am not surprised by what I am seeing and in fact the wound appears to be better as compared to previous findings. 07/24/2020 upon evaluation today patient appears to be doing well with regard to her wound. Overall I am extremely pleased with where things stand today. I do not see any signs of active infection which is great and overall I think that the patient is making good progress. There is good to be some need for sharp debridement today. 07/31/2020 upon evaluation today patient appears to be doing better in regard to her heel ulcer. She has been tolerating the dressing changes without complication. Fortunately there does not appear to be any signs of active infection which is great and overall very pleased in this regard. No fevers, chills, nausea, vomiting, or diarrhea. 08/14/2020 upon evaluation today patient appears to be doing better in regard to her heel ulcer. I am very pleased in that regard. Unfortunately she still has a slight deep tissue injury in regard to the medial portion of her foot over the same area. Unfortunately I think this is something that if were not careful it was can open up into the wound. That is my main concern here based on what I see. Fortunately there does not appear to be any evidence of active infection which is great news and overall very pleased with where things stand at this point. 08/21/2020 upon evaluation today patient appears  to be doing well with regard to her wound. She has been  tolerating the dressing changes without complication. Fortunately there does not appear to be any signs of active infection at this time. No fevers, chills, nausea, vomiting, or diarrhea. I do believe the compression wrap was beneficial for her. 08/28/2020 upon evaluation today patient appears to be doing well with regard to her wounds currently. Fortunately there does not appear to be any signs of active infection at this time. No fevers, chills, nausea, vomiting, or diarrhea. With that being said I think that her leg is doing quite well to be honest. 09/04/2020 upon evaluation today patient appears to be doing well with regard to her wound on the heel. This is showing signs of good epithelial growth although there is probably can it be an indention where this heals that is okay as long as we get it closed. Fortunately I do not see any evidence of infection at this point. 09/13/2020 upon evaluation today patient appears to be doing well with regard to her wounds. She has been tolerating the dressing changes Buckingham, Consuella L. ([id number]) without complication. Fortunately he is actually doing extremely well and I think she is making great progress this is measuring smaller. I would recommend that such that we continue with the [facility] likely since he is doing so well. 09/18/2020 upon evaluation today patient appears to be doing well with regard to her heel ulcer. She is making good progress and I am very pleased with what we see today. I think the Hydrofera Blue is still doing excellent. 10/02/2020 upon evaluation today patient's wound actually appears to be showing signs of good improvement in regard to the heel. I am very pleased in this regard. With that being said I do believe that the area which is somewhat been stable and dry is starting to lift up and beginning to clear this away as well that is on the foot. Subsequently I am going to have to perform some debridement here and we did actually  go ahead and allow this as a wound today as before it was just more deep tissue injury and eschar covering now I think it is a little bit more than that to be honest. 10/09/2020 upon evaluation today patient appears to be doing decently well in regard to her wounds. I am actually very pleased with where things stand today. There does not appear to be any signs of active infection which is great news. No fevers, chills, nausea, vomiting, or diarrhea. She is going require some sharp debridement today. 10/16/2020 upon evaluation today patient appears to be doing decently well in regard to her heel as well as the foot. Both are showing signs of good improvement which is great news. In general and extremely pleased with where things stand at this point. She is tolerating the [facility] well although this is getting so small in the heel I think collagen may be a better option here based on what I am seeing. With regard to the foot the Iodoflex/Iodosorb is still probably the best method here. 10/26/2020 upon evaluation today patient actually appears to be doing well in some respects today. She has been tolerating the dressing changes without complication and this is great news. The Hydrofera Blue is done well for the heel this actually appears to be healed today. With that being said in regard to the foot I think we are ready to switch to [facility] based on what I see at this  point. 10/30/2020 upon evaluation today patient appears to be doing well with regard to his wound. He has been tolerating the dressing changes without complication. Fortunately there does not appear to be any signs of active infection systemically at this time which is great news and overall very pleased with where things stand today. I do think that she is making progress although it somewhat slow still where showing signs of improvement little by little here. 11/06/2020 upon evaluation today patient appears to be doing decently  well in regard to her wound. We will start and see better overall appearance of the base of the wound which is great news she still continues to have some abnormal fluorescence signals with a MolecuLight DX that will be detailed below. 11/20/2020 upon evaluation today patient's wound actually showing signs of improvement. There is good to be some need for sharp debridement today and that was discussed with the patient. I am going to go ahead and clean this up but I do believe the [facility] is doing a great job. 11/27/2020; the patient had eschar over the original heel wound which I removed with a #3 curette there was nothing open here. The area is on the lateral left foot foot. 12/04/2020 upon evaluation today patient appears to be doing well with regard to her wound. The wound bed actually appeared to be doing well after I remove the dry endoform from the wound bed. This I tracked little bit of fluid but nonetheless underneath this appears to be doing awesome. I am very pleased with where things stand today. No fevers, chills, nausea, vomiting, or diarrhea. 12/11/2020 upon evaluation today patient appears to be doing well with regard to her wound although it keeps getting dry and filled up with the endoform I am not seeing any signs of infection but we are going to have to clean this away in order to try and get things moving in an appropriate direction. I discussed that with patient's daughter today as well. She is in agreement with proceeding as such. 12/21/2020 upon evaluation today patient appears to be doing well with regard to her wound this did not seal off like it was with the collagen but nonetheless also did not really feeling quite as much as I would like to have seen. I do believe that we may need to go ahead and see about doing a debridement today and I really feel like we may want to switch back to the [facility] which I felt like was doing better in the past. Good news is the  base of the wound appears healthy with good granulation tissue. 12/28/2020 upon evaluation patient's wound actually showed signs of doing quite well in regard to her foot. I think that we getting very close to complete resolution and overall I am extremely happy with where things stand today. 01/04/2021 upon evaluation today patient appears to be doing well with regard to her wound on the foot. This is good to require little bit of debridement today but overall seems to be doing quite well. I am actually very pleased with where we stand currently. 01/08/2021 upon evaluation today patient's wound actually showing signs of some improvement although she still has some callus buildup around the edges of the wound unfortunately I think this is continuing to rub despite what we are doing currently. I think that we may need to go ahead and see about switching up the dressing a little bit I Minna recommend collagen and then on top  of the collagen we will get a use some foam double layered cut in a doughnut to try to take pressure off of this area we will see how this does over the next week. 01/15/2021 upon evaluation today patient's wound is actually showing signs of being completely dried over with the collagen. This is definitely not what we are looking for. I think that the wound dried out too quickly is the main issue that we are running into at this point and I really think we may need to do something else try to keep that from occurring. 01/22/2021 upon evaluation today patient's wound actually did stay open it did not callus over as its been doing in the past this is good news. Also feel like it may not be quite as deep as what we have previously seen. There is still not as much improvement he does what I would like to see but nonetheless I think we are headed in the right direction. 01/29/2021 upon evaluation today patient appears to be doing okay in regard to her wound is not too dry but  unfortunately is too wet the Xeroform just did not do what I was hoping that she made things a little bit worse as far as size wise I am actually going to try at this point considering this is more open but not as deep the Hydrofera Blue to see if this will be beneficial at this time. Again we seem to be cycling through things and it is becoming a little difficult to get this to completely close but in the past Hydrofera Blue is what she did the best with 1 week to keep it from closing up prematurely. 02/05/2021 upon evaluation today patient's wound actually showing some signs of improvement which is great news. Fortunately I do not see any evidence of active infection locally nor systemically. It is definitely not too wet which is been the biggest issue we have seen up to this point over the past several weeks. [name], [name] ([id number]) Electronic Signature(s) Signed: 02/05/2021 2:25:38 PM By: Worthy Keeler PA-C Entered By: Worthy Keeler on 02/05/2021 14:25:37 [name], [name] ([id number]) -------------------------------------------------------------------------------- Physical Exam Details Patient Name: Outland, Lamika L. Date of Service: 02/05/2021 11:15 AM Medical Record Number: [id number] Patient Account Number: [id number] Date of Birth/Sex: 1926-11-18 (87 y.o. F) Treating RN: Donnamarie Poag Primary Care Provider: [name] Other Clinician: Referring Provider: [name] Treating Provider/Extender: Skipper Cliche in Treatment: 60 Constitutional Well-nourished and well-hydrated in no acute distress. Respiratory normal breathing without difficulty. Psychiatric this patient is able to make decisions and demonstrates good insight into disease process. Alert and Oriented x 3. pleasant and cooperative. Notes Upon inspection patient's wound bed showed signs of good granulation epithelization at this point. Fortunately I do not see any evidence of active infection  locally nor systemically currently which is also great news. Electronic Signature(s) Signed: 02/05/2021 2:25:51 PM By: Worthy Keeler PA-C Entered By: Worthy Keeler on 02/05/2021 14:25:51 [name], [name] ([id number]) -------------------------------------------------------------------------------- Physician Orders Details Patient Name: [name], [name] Date of Service: 02/05/2021 11:15 AM Medical Record Number: [id number] Patient Account Number: [id number] Date of Birth/Sex: 1926-09-26 (87 y.o. F) Treating RN: Donnamarie Poag Primary Care Provider: [name] Other Clinician: Referring Provider: [name] Treating Provider/Extender: Skipper Cliche in Treatment: 45 Verbal / Phone Orders: No Diagnosis Coding ICD-10 Coding Code Description [phone] Pressure ulcer of other site, stage 3 I10 Essential (primary) hypertension F01.50 Vascular dementia without behavioral disturbance I25.10  Atherosclerotic heart disease of native coronary artery without angina pectoris Follow-up Appointments o Return Appointment in 2 weeks. - due to out of town o Nurse Visit as needed [employer] o May shower with wound dressing protected with water repellent cover or cast protector. o No tub bath. Anesthetic (Use 'Patient Medications' Section for Anesthetic Order Entry) o Lidocaine applied to wound bed Edema Control - Lymphedema / Segmental Compressive Device / Other o Optional: One layer of unna paste to top of compression wrap (to act as an anchor). o Tubigrip single layer applied. - left leg size D o Patient to wear own compression stockings. Remove compression stockings every night before going to bed and put on every morning when getting up. - right leg o Elevate legs to the level of the heart and pump ankles as often as possible o Elevate leg(s) parallel to the floor when sitting. o DO YOUR BEST to sleep in the bed at night. DO NOT sleep in your  recliner. Long hours of sitting in a recliner leads to swelling of the legs and/or potential wounds on your backside. Off-Loading o Open toe surgical shoe - left o Other: - Offloading boots while in bed; Try pillow under sheet to keep it in place. Additional Orders / Instructions o Follow Nutritious Diet and Increase Protein Intake Wound Treatment Wound #3 - Foot Wound Laterality: Left Cleanser: Soap and Water 2 x Per Week/30 Days Discharge Instructions: Gently cleanse wound with antibacterial soap, rinse and pat dry prior to dressing wounds Primary Dressing: Hydrofera Blue Ready Transfer Foam, 2.5x2.5 (in/in) 2 x Per Week/30 Days Discharge Instructions: ONY ON TOP of wound only-Apply Hydrofera Blue Ready to wound bed as directed Secondary Dressing: Zetuvit Plus Silicone Border Dressing 4x4 (in/in) 2 x Per Week/30 Days Secured With: Tubigrip Size D, 3x10 (in/yd) 2 x Per Week/30 Days Electronic Signature(s) Signed: 02/05/2021 4:09:47 PM By: Donnamarie Poag Signed: 02/05/2021 4:40:54 PM By: Worthy Keeler PA-C Entered By: Donnamarie Poag on 02/05/2021 12:15:31 [name], [name] ([id number]) [name], [name] ([id number]) -------------------------------------------------------------------------------- Problem List Details Patient Name: Solorzano, Selena L. Date of Service: 02/05/2021 11:15 AM Medical Record Number: [id number] Patient Account Number: [id number] Date of Birth/Sex: 1926-08-27 (87 y.o. F) Treating RN: Donnamarie Poag Primary Care Provider: [name] Other Clinician: Referring Provider: [name] Treating Provider/Extender: Skipper Cliche in Treatment: 32 Active Problems ICD-10 Encounter Code Description Active Date MDM Diagnosis [phone] Pressure ulcer of other site, stage 3 10/02/2020 No Yes I10 Essential (primary) hypertension 06/26/2020 No Yes F01.50 Vascular dementia without behavioral disturbance 06/26/2020 No Yes I25.10 Atherosclerotic heart disease of native  coronary artery without angina 06/26/2020 No Yes pectoris Inactive Problems Resolved Problems ICD-10 Code Description Active Date Resolved Date L89.623 Pressure ulcer of left heel, stage 3 06/26/2020 06/26/2020 Electronic Signature(s) Signed: 02/05/2021 11:40:02 AM By: Worthy Keeler PA-C Entered By: Worthy Keeler on 02/05/2021 11:40:02 [name], [name] ([id number]) -------------------------------------------------------------------------------- Progress Note Details Patient Name: [name], [name] Date of Service: 02/05/2021 11:15 AM Medical Record Number: [id number] Patient Account Number: [id number] Date of Birth/Sex: 27-Jan-1927 (87 y.o. F) Treating RN: Donnamarie Poag Primary Care Provider: [name] Other Clinician: Referring Provider: [name] Treating Provider/Extender: Skipper Cliche in Treatment: 32 Subjective Chief Complaint Information obtained from Patient Left Heel Pressure Ulcer History of Present Illness (HPI) 87 year old patient was recently seen by the PCPs office for significant pain right great toe which has been going on since July. She was initially treated with Keflex which she did  not complete and after the office visit this time she has been put on doxycycline. at the time of her visit she was found to have a ulcer on the plantar surface of the right great toe and also had a pyogenic granuloma over this area. X-ray of the right foot done 12/29/2014 -- IMPRESSION:Soft-tissue swelling and ulceration right great toe. No underlying bony lytic lesion identified. If osteomyelitis remains of clinical concern MRI can be obtained. Past medical history significant for anemia, chronic kidney disease stage III, obesity, varicose veins, coronary artery disease, gout, history of nicotine addiction given up smoking in 2002, hypertension, status post cardiac catheterization, status post abdominal hysterectomy, cholecystectomy and tonsillectomy. hemoglobin A1c done  in August was 5.8 01/22/2015 -- at this stage the [facility] Walking boat was going to cost them significant amount of money and they want to defer using that at the present time. 01/29/2015 -- she had a podiatry appointment and they have trimmed her toenails. She has not heard back from the vascular office regarding her venous duplex study and I have asked them to call personally so that they can get the appointment soon. 02/06/2015 -- they have made contact with the vascular office and from what I understand that test has been done but the report is pending. 02/19/2014 -- the vascular test is scheduled for tomorrow Readmission: 06/26/2020 upon evaluation today patient appears for initial evaluation in her clinic that she has been here before in 2016 and has been quite sometime. She did have a fractured hip in February on the 23rd 2022. Subsequently this had to be pinned and she ended up with a pressure injury on her heel following when she was using her foot to help move her around in the bed. Subsequently this has led to the wound that she has been dealing with since that time. She lives at home with her daughter currently she does have dementia. 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 07/03/2020 upon evaluation today patient appears to be doing well with regard to her heel ulcer. She did have arterial studies they appear to be doing excellent it was premature normal across the board with TBI's in the 90s and ABIs well within normal range. Nonetheless there does not appear to be any signs of arterial insufficiency whatsoever. With that being said the patient does have also signs of improvement there is some necrotic tissue in the base of the wound number to try to clear some of that away today I do believe the Iodoflex/Iodosorb is doing well. 5/24; difficult punched out  wound on the left medial heel. She has been using Iodoflex 07/17/2020 upon evaluation today patient appears to be doing well at this point in regard to her wound. I do feel like this is a little bit deeper but again that is what is expected as we continue with the Iodoflex I think that this is just going to get deeper until we get to the base of the wound. With that being said I think we are getting much closer to the base of the wound where we can have healthier tissue that we get a be managing here which that will be awesome. In the meantime I am not surprised by what I am seeing and in fact the wound appears to be better as compared to previous findings. 07/24/2020 upon evaluation today patient appears to be doing well  with regard to her wound. Overall I am extremely pleased with where things stand today. I do not see any signs of active infection which is great and overall I think that the patient is making good progress. There is good to be some need for sharp debridement today. 07/31/2020 upon evaluation today patient appears to be doing better in regard to her heel ulcer. She has been tolerating the dressing changes without complication. Fortunately there does not appear to be any signs of active infection which is great and overall very pleased in this regard. No fevers, chills, nausea, vomiting, or diarrhea. 08/14/2020 upon evaluation today patient appears to be doing better in regard to her heel ulcer. I am very pleased in that regard. Unfortunately she still has a slight deep tissue injury in regard to the medial portion of her foot over the same area. Unfortunately I think this is something that if were not careful it was can open up into the wound. That is my main concern here based on what I see. Fortunately there does not appear to be any evidence of active infection which is great news and overall very pleased with where things stand at this point. 08/21/2020 upon evaluation today patient  appears to be doing well with regard to her wound. She has been tolerating the dressing changes without complication. Fortunately there does not appear to be any signs of active infection at this time. No fevers, chills, nausea, vomiting, or diarrhea. I do believe the compression wrap was beneficial for her. 08/28/2020 upon evaluation today patient appears to be doing well with regard to her wounds currently. Fortunately there does not appear to be any signs of active infection at this time. No fevers, chills, nausea, vomiting, or diarrhea. With that being said I think that her leg is doing quite well to be honest. Bezanson, [name] ([id number]) 09/04/2020 upon evaluation today patient appears to be doing well with regard to her wound on the heel. This is showing signs of good epithelial growth although there is probably can it be an indention where this heals that is okay as long as we get it closed. Fortunately I do not see any evidence of infection at this point. 09/13/2020 upon evaluation today patient appears to be doing well with regard to her wounds. She has been tolerating the dressing changes without complication. Fortunately he is actually doing extremely well and I think she is making great progress this is measuring smaller. I would recommend that such that we continue with the [facility] likely since he is doing so well. 09/18/2020 upon evaluation today patient appears to be doing well with regard to her heel ulcer. She is making good progress and I am very pleased with what we see today. I think the Hydrofera Blue is still doing excellent. 10/02/2020 upon evaluation today patient's wound actually appears to be showing signs of good improvement in regard to the heel. I am very pleased in this regard. With that being said I do believe that the area which is somewhat been stable and dry is starting to lift up and beginning to clear this away as well that is on the foot. Subsequently I am  going to have to perform some debridement here and we did actually go ahead and allow this as a wound today as before it was just more deep tissue injury and eschar covering now I think it is a little bit more than that to be honest. 10/09/2020 upon evaluation today patient  appears to be doing decently well in regard to her wounds. I am actually very pleased with where things stand today. There does not appear to be any signs of active infection which is great news. No fevers, chills, nausea, vomiting, or diarrhea. She is going require some sharp debridement today. 10/16/2020 upon evaluation today patient appears to be doing decently well in regard to her heel as well as the foot. Both are showing signs of good improvement which is great news. In general and extremely pleased with where things stand at this point. She is tolerating the [facility] well although this is getting so small in the heel I think collagen may be a better option here based on what I am seeing. With regard to the foot the Iodoflex/Iodosorb is still probably the best method here. 10/26/2020 upon evaluation today patient actually appears to be doing well in some respects today. She has been tolerating the dressing changes without complication and this is great news. The Hydrofera Blue is done well for the heel this actually appears to be healed today. With that being said in regard to the foot I think we are ready to switch to [facility] based on what I see at this point. 10/30/2020 upon evaluation today patient appears to be doing well with regard to his wound. He has been tolerating the dressing changes without complication. Fortunately there does not appear to be any signs of active infection systemically at this time which is great news and overall very pleased with where things stand today. I do think that she is making progress although it somewhat slow still where showing signs of improvement little by little  here. 11/06/2020 upon evaluation today patient appears to be doing decently well in regard to her wound. We will start and see better overall appearance of the base of the wound which is great news she still continues to have some abnormal fluorescence signals with a MolecuLight DX that will be detailed below. 11/20/2020 upon evaluation today patient's wound actually showing signs of improvement. There is good to be some need for sharp debridement today and that was discussed with the patient. I am going to go ahead and clean this up but I do believe the [facility] is doing a great job. 11/27/2020; the patient had eschar over the original heel wound which I removed with a #3 curette there was nothing open here. The area is on the lateral left foot foot. 12/04/2020 upon evaluation today patient appears to be doing well with regard to her wound. The wound bed actually appeared to be doing well after I remove the dry endoform from the wound bed. This I tracked little bit of fluid but nonetheless underneath this appears to be doing awesome. I am very pleased with where things stand today. No fevers, chills, nausea, vomiting, or diarrhea. 12/11/2020 upon evaluation today patient appears to be doing well with regard to her wound although it keeps getting dry and filled up with the endoform I am not seeing any signs of infection but we are going to have to clean this away in order to try and get things moving in an appropriate direction. I discussed that with patient's daughter today as well. She is in agreement with proceeding as such. 12/21/2020 upon evaluation today patient appears to be doing well with regard to her wound this did not seal off like it was with the collagen but nonetheless also did not really feeling quite as much as I would  like to have seen. I do believe that we may need to go ahead and see about doing a debridement today and I really feel like we may want to switch back to the  [facility] which I felt like was doing better in the past. Good news is the base of the wound appears healthy with good granulation tissue. 12/28/2020 upon evaluation patient's wound actually showed signs of doing quite well in regard to her foot. I think that we getting very close to complete resolution and overall I am extremely happy with where things stand today. 01/04/2021 upon evaluation today patient appears to be doing well with regard to her wound on the foot. This is good to require little bit of debridement today but overall seems to be doing quite well. I am actually very pleased with where we stand currently. 01/08/2021 upon evaluation today patient's wound actually showing signs of some improvement although she still has some callus buildup around the edges of the wound unfortunately I think this is continuing to rub despite what we are doing currently. I think that we may need to go ahead and see about switching up the dressing a little bit I Minna recommend collagen and then on top of the collagen we will get a use some foam double layered cut in a doughnut to try to take pressure off of this area we will see how this does over the next week. 01/15/2021 upon evaluation today patient's wound is actually showing signs of being completely dried over with the collagen. This is definitely not what we are looking for. I think that the wound dried out too quickly is the main issue that we are running into at this point and I really think we may need to do something else try to keep that from occurring. 01/22/2021 upon evaluation today patient's wound actually did stay open it did not callus over as its been doing in the past this is good news. Also feel like it may not be quite as deep as what we have previously seen. There is still not as much improvement he does what I would like to see but nonetheless I think we are headed in the right direction. 01/29/2021 upon evaluation today  patient appears to be doing okay in regard to her wound is not too dry but unfortunately is too wet the Xeroform just did not do what I was hoping that she made things a little bit worse as far as size wise I am actually going to try at this point considering this is more open but not as deep the Hydrofera Blue to see if this will be beneficial at this time. Again we seem to be cycling through things and it is becoming a little difficult to get this to completely close but in the past Hydrofera Blue is what she did the best with 1 week to keep Eaves, Reginna L. ([id number]) it from closing up prematurely. 02/05/2021 upon evaluation today patient's wound actually showing some signs of improvement which is great news. Fortunately I do not see any evidence of active infection locally nor systemically. It is definitely not too wet which is been the biggest issue we have seen up to this point over the past several weeks. Objective Constitutional Well-nourished and well-hydrated in no acute distress. Vitals Time Taken: 11:54 AM, Height: 58 in, Weight: 137 lbs, BMI: 28.6, Temperature: 97.8 F, Pulse: 74 bpm, Respiratory Rate: 16 breaths/min, Blood Pressure: 122/76 mmHg. Respiratory normal breathing without difficulty. Psychiatric  this patient is able to make decisions and demonstrates good insight into disease process. Alert and Oriented x 3. pleasant and cooperative. General Notes: Upon inspection patient's wound bed showed signs of good granulation epithelization at this point. Fortunately I do not see any evidence of active infection locally nor systemically currently which is also great news. Integumentary (Hair, Skin) Wound #3 status is Open. Original cause of wound was Pressure Injury. The date acquired was: 10/02/2020. The wound has been in treatment 18 weeks. The wound is located on the Left Foot. The wound measures 0.5cm length x 0.5cm width x 0.3cm depth; 0.196cm^2 area and  0.059cm^3 volume. There is Fat Layer (Subcutaneous Tissue) exposed. There is no tunneling or undermining noted. There is a medium amount of serous drainage noted. The wound margin is thickened. There is medium (34-66%) red, pink granulation within the wound bed. There is a medium (34- 66%) amount of necrotic tissue within the wound bed including Adherent Slough. Wound #4 status is Healed - Epithelialized. Original cause of wound was Trauma. The date acquired was: 01/27/2021. The wound has been in treatment 1 weeks. The wound is located on the Left Lower Leg. The wound measures 0cm length x 0cm width x 0cm depth; 0cm^2 area and 0cm^3 volume. There is no tunneling or undermining noted. There is a none present amount of drainage noted. The wound margin is indistinct and nonvisible. There is no granulation within the wound bed. There is a large (67-100%) amount of necrotic tissue within the wound bed including Eschar. Assessment Active Problems ICD-10 Pressure ulcer of other site, stage 3 Essential (primary) hypertension Vascular dementia without behavioral disturbance Atherosclerotic heart disease of native coronary artery without angina pectoris Procedures Wound #3 Pre-procedure diagnosis of Wound #3 is a Pressure Ulcer located on the Left Foot . There was a Excisional Skin/Subcutaneous Tissue Debridement with a total area of 0.26 sq cm performed by Tommie Sams., PA-C. With the following instrument(s): Curette to remove Viable and Non-Viable tissue/material. Material removed includes Subcutaneous Tissue and Slough and after achieving pain control using Lidocaine. A time out was conducted at 12:09, prior to the start of the procedure. A Minimum amount of bleeding was controlled with Pressure. The procedure was tolerated well. Post Debridement Measurements: 0.5cm length x 0.5cm width x 0.3cm depth; 0.059cm^3 volume. Post debridement Stage noted as Category/Stage III. Character of Wound/Ulcer  Post Debridement is improved. Post procedure Diagnosis Wound #3: Same as Pre-Procedure Rummell, Kameko L. ([id number]) Plan Follow-up Appointments: Return Appointment in 2 weeks. - due to out of town Nurse Visit as needed Midwife Hygiene: May shower with wound dressing protected with water repellent cover or cast protector. No tub bath. Anesthetic (Use 'Patient Medications' Section for Anesthetic Order Entry): Lidocaine applied to wound bed Edema Control - Lymphedema / Segmental Compressive Device / Other: Optional: One layer of unna paste to top of compression wrap (to act as an anchor). Tubigrip single layer applied. - left leg size D Patient to wear own compression stockings. Remove compression stockings every night before going to bed and put on every morning when getting up. - right leg Elevate legs to the level of the heart and pump ankles as often as possible Elevate leg(s) parallel to the floor when sitting. DO YOUR BEST to sleep in the bed at night. DO NOT sleep in your recliner. Long hours of sitting in a recliner leads to swelling of the legs and/or potential wounds on your backside. Off-Loading: Open toe surgical shoe -  left Other: - Offloading boots while in bed; Try pillow under sheet to keep it in place. Additional Orders / Instructions: Follow Nutritious Diet and Increase Protein Intake WOUND #3: - Foot Wound Laterality: Left Cleanser: Soap and Water 2 x Per Week/30 Days Discharge Instructions: Gently cleanse wound with antibacterial soap, rinse and pat dry prior to dressing wounds Primary Dressing: Hydrofera Blue Ready Transfer Foam, 2.5x2.5 (in/in) 2 x Per Week/30 Days Discharge Instructions: ONY ON TOP of wound only-Apply Hydrofera Blue Ready to wound bed as directed Secondary Dressing: Zetuvit Plus Silicone Border Dressing 4x4 (in/in) 2 x Per Week/30 Days Secured With: Tubigrip Size D, 3x10 (in/yd) 2 x Per Week/30 Days 1. Would recommend that we going  continue with the wound care measures as before and the patient is in agreement with the plan. This includes the use of the [facility] dressing which I think is doing a good job. 2. We are going to continue with the Zetuvit to cover for now followed by Tubigrip. We are doing this since the patient is actually going on vacation with her family and will not be back for 2 weeks. 3. I am also can recommend continued elevation of the leg to keep edema under good control. We will see patient back for reevaluation in 2 weeks here in the clinic. If anything worsens or changes patient will contact our office for additional recommendations. Electronic Signature(s) Signed: 02/05/2021 2:26:41 PM By: Worthy Keeler PA-C Entered By: Worthy Keeler on 02/05/2021 14:26:40 Emily, Idabell L. ([id number]) -------------------------------------------------------------------------------- SuperBill Details Patient Name: [name], Ardelle L. Date of Service: 02/05/2021 Medical Record Number: [id number] Patient Account Number: [id number] Date of Birth/Sex: 1926/07/07 (87 y.o. F) Treating RN: Donnamarie Poag Primary Care Provider: [name] Other Clinician: Referring Provider: [name] Treating Provider/Extender: Skipper Cliche in Treatment: 32 Diagnosis Coding ICD-10 Codes Code Description [phone] Pressure ulcer of other site, stage 3 I10 Essential (primary) hypertension F01.50 Vascular dementia without behavioral disturbance I25.10 Atherosclerotic heart disease of native coronary artery without angina pectoris Facility Procedures CPT4 Code: [id number] Description: 38882 - DEB SUBQ TISSUE 20 SQ CM/< Modifier: Quantity: 1 CPT4 Code: Description: ICD-10 Diagnosis Description L89.893 Pressure ulcer of other site, stage 3 Modifier: Quantity: Physician Procedures CPT4 Code: [id number] Description: 11042 - WC PHYS SUBQ TISS 20 SQ CM Modifier: Quantity: 1 CPT4 Code: Description: ICD-10 Diagnosis  Description L89.893 Pressure ulcer of other site, stage 3 Modifier: Quantity: Electronic Signature(s) Signed: 02/05/2021 2:27:19 PM By: Worthy Keeler PA-C Entered By: Worthy Keeler on 02/05/2021 14:27:18

## 2021-02-12 ENCOUNTER — Encounter: Payer: PPO | Admitting: Internal Medicine

## 2021-02-19 ENCOUNTER — Other Ambulatory Visit: Payer: Self-pay

## 2021-02-19 ENCOUNTER — Ambulatory Visit (INDEPENDENT_AMBULATORY_CARE_PROVIDER_SITE_OTHER): Payer: PPO | Admitting: Family Medicine

## 2021-02-19 ENCOUNTER — Encounter: Payer: Self-pay | Admitting: Family Medicine

## 2021-02-19 VITALS — BP 109/59 | HR 86 | Temp 98.7°F | Resp 14 | Wt 128.0 lb

## 2021-02-19 DIAGNOSIS — I10 Essential (primary) hypertension: Secondary | ICD-10-CM

## 2021-02-19 DIAGNOSIS — I82461 Acute embolism and thrombosis of right calf muscular vein: Secondary | ICD-10-CM

## 2021-02-19 DIAGNOSIS — I493 Ventricular premature depolarization: Secondary | ICD-10-CM

## 2021-02-19 NOTE — Progress Notes (Signed)
Established patient visit   Patient: [name] [name]   DOB: April 02, 1926   88 y.o. Female  MRN: [id number] Visit Date: 02/19/2021  Today's healthcare provider: [name], MD   Chief Complaint  Patient presents with   Hypertension   Subjective    HPI  Hypertension, follow-up  BP Readings from Last 3 Encounters:  02/19/21 (!) 109/59  12/03/20 132/81  11/29/20 140/82   Wt Readings from Last 3 Encounters:  02/19/21 128 lb (58.1 kg)  12/03/20 131 lb (59.4 kg)  05/28/20 137 lb (62.1 kg)     She was last seen for hypertension 6 months ago.  BP at that visit was 112/66. Management since that visit includes no changes consider reducing valsartan/hctz with next refill.  She reports good compliance with treatment. She is not having side effects.  She is following a Regular diet. She is not exercising. She does not smoke.  Use of agents associated with hypertension: thyroid hormones.   Outside blood pressures are 130/70. Symptoms: No chest pain No chest pressure  No palpitations No syncope  No dyspnea No orthopnea  No paroxysmal nocturnal dyspnea No lower extremity edema   Pertinent labs: Lab Results  Component Value Date   CHOL 173 07/01/2019   HDL 53 07/01/2019   LDLCALC 104 (H) 07/01/2019   TRIG 84 07/01/2019   CHOLHDL 3.3 07/01/2019   Lab Results  Component Value Date   NA 138 11/29/2020   K 4.0 11/29/2020   CREATININE 1.25 (H) 11/29/2020   GFRNONAA 40 (L) 11/29/2020   GLUCOSE 101 (H) 11/29/2020   TSH 4.240 09/10/2020     The ASCVD Risk score (Arnett DK, et al., 2019) failed to calculate for the following reasons:   The 2019 ASCVD risk score is only valid for ages 28 to 24   --------------------------------------------------------------------------------------------------- Hypothyroid, follow-up  Lab Results  Component Value Date   TSH 4.240 09/10/2020   TSH 10.600 (H) 01/10/2020   TSH 8.960 (H) 06/13/2019   FREET4 1.17 09/10/2020   FREET4  0.95 01/10/2020    Wt Readings from Last 3 Encounters:  02/19/21 128 lb (58.1 kg)  12/03/20 131 lb (59.4 kg)  05/28/20 137 lb (62.1 kg)    She was last seen for hypothyroid 5 months ago.  Management since that visit includes continue same dose of levothyroxine. She reports good compliance with treatment. She is not having side effects.   Symptoms: No change in energy level No constipation  No diarrhea No heat / cold intolerance  No nervousness No palpitations  No weight changes    -----------------------------------------------------------------------------------------   Also she is on her third month of Eliquis after developing DVT of right gastroc in October. Is still keeping compression on leg, but swelling is mostly resolved and is not painful.   Medications: Outpatient Medications Prior to Visit  Medication Sig   allopurinol (ZYLOPRIM) 100 MG tablet Take 2 tablets (200 mg total) by mouth daily.   amLODipine (NORVASC) 2.5 MG tablet TAKE ONE TABLET BY MOUTH EVERY EVENING   Calcium Carb-Cholecalciferol (CALCIUM 600 + D PO) Take 1 tablet by mouth daily.   docusate sodium (COLACE) 100 MG capsule Take 1 capsule (100 mg total) by mouth 2 (two) times daily. Hold if having loose or frequent BM's.   doxycycline (VIBRAMYCIN) 100 MG capsule Take 1 capsule (100 mg total) by mouth 2 (two) times daily.   ELIQUIS 5 MG TABS tablet TAKE ONE TABLET BY MOUTH TWICE DAILY   feeding  supplement (ENSURE ENLIVE / ENSURE PLUS) LIQD Take 237 mLs by mouth 2 (two) times daily between meals.   ferrous sulfate 325 (65 FE) MG tablet Take 325 mg by mouth daily with breakfast.   levothyroxine (SYNTHROID) 88 MCG tablet Take 1 tablet (88 mcg total) by mouth daily before breakfast.   Multiple Vitamins-Minerals (PRESERVISION AREDS 2 PO) Take 1 tablet by mouth 2 (two) times daily.   omeprazole (PRILOSEC) 20 MG capsule TAKE 1 CAPSULE BY MOUTH DAILY   valsartan-hydrochlorothiazide (DIOVAN-HCT) 320-12.5 MG tablet  Take 1 tablet by mouth daily.   [DISCONTINUED] alum & mag hydroxide-simeth (MAALOX/MYLANTA) 200-200-20 MG/5ML suspension Take 30 mLs by mouth every 4 (four) hours as needed for indigestion. (Patient not taking: Reported on 05/28/2020)   [DISCONTINUED] bisacodyl (DULCOLAX) 10 MG suppository Place 1 suppository (10 mg total) rectally daily as needed for moderate constipation. (Patient not taking: No sig reported)   No facility-administered medications prior to visit.    Review of Systems  Constitutional:  Negative for appetite change, chills, fatigue and fever.  Respiratory:  Negative for chest tightness and shortness of breath.   Cardiovascular:  Negative for chest pain and palpitations.  Gastrointestinal:  Negative for abdominal pain, nausea and vomiting.  Neurological:  Negative for dizziness and weakness.      Objective    BP (!) 109/59 (BP Location: Left Arm, Patient Position: Sitting, Cuff Size: Normal)    Pulse 86    Temp 98.7 F (37.1 C) (Oral)    Resp 14    Wt 128 lb (58.1 kg)    SpO2 98% Comment: room air   BMI 26.75 kg/m  {Show previous vital signs (optional):23777}  Physical Exam   General: Appearance:     Overweight female in no acute distress  Eyes:    PERRL, conjunctiva/corneas clear, EOM's intact       Lungs:     Clear to auscultation bilaterally, respirations unlabored  Heart:    Normal heart rate. Irregular. No murmurs, rubs, or gallops.    MS:   All extremities are intact.  Prominent varicosities both lower legs. No cords, no swelling, no tenderness, no erythema.   Neurologic:   Awake, alert, oriented x 3. No apparent focal neurological defect.          Assessment & Plan     1. Primary hypertension Well controlled.  Borderline hypotensive in office, but her daughter reports home blood pressures in the 130s/70s.  - EKG 12-Lead  2. Ventricular ectopic beats Asymptomatic.   3. Acute deep vein thrombosis (DVT) of calf muscle vein of right lower extremity  (HCC) Will finished 3 mos course of NOAC this month.   Future Appointments  Date Time Provider [place]  02/21/2021 11:30 AM Jeri Cos [place] III, PA-C [facility] None  02/28/2021 11:30 AM Jeri Cos Eday III, PA-C [facility] None  03/07/2021 11:30 AM Woodroe Chen III, PA-C [facility] None  03/14/2021 11:30 AM Woodroe Chen III, PA-C [facility] None  03/22/2021 11:15 AM Edrick Kins, DPM [facility]  06/21/2021 11:00 AM Birdie Sons, MD [facility]        The entirety of the information documented in the History of Present Illness, Review of Systems and Physical Exam were personally obtained by me. Portions of this information were initially documented by the CMA and reviewed by me for thoroughness and accuracy.     [name], MD  [facility] [phone] (phone) [phone] (fax)  [place]

## 2021-02-21 ENCOUNTER — Other Ambulatory Visit: Payer: Self-pay

## 2021-02-21 ENCOUNTER — Encounter: Payer: PPO | Attending: Physician Assistant | Admitting: Physician Assistant

## 2021-02-21 DIAGNOSIS — N183 Chronic kidney disease, stage 3 unspecified: Secondary | ICD-10-CM | POA: Diagnosis not present

## 2021-02-21 DIAGNOSIS — I2510 Atherosclerotic heart disease of native coronary artery without angina pectoris: Secondary | ICD-10-CM | POA: Diagnosis not present

## 2021-02-21 DIAGNOSIS — I129 Hypertensive chronic kidney disease with stage 1 through stage 4 chronic kidney disease, or unspecified chronic kidney disease: Secondary | ICD-10-CM | POA: Insufficient documentation

## 2021-02-21 DIAGNOSIS — L89893 Pressure ulcer of other site, stage 3: Secondary | ICD-10-CM | POA: Insufficient documentation

## 2021-02-21 DIAGNOSIS — F0154 Vascular dementia, unspecified severity, with anxiety: Secondary | ICD-10-CM | POA: Insufficient documentation

## 2021-02-21 NOTE — Progress Notes (Signed)
Stolze, Laveah L. ([id number]) Visit Report for 02/21/2021 Fall Risk Assessment Details Patient Name: [name] [name], [name] Date of Service: 02/21/2021 11:30 AM Medical Record Number: [id number] Patient Account Number: [id number] Date of Birth/Sex: 08/24/1926 (88 y.o. F) Treating RN: Donnamarie Poag Primary Care Mckell Riecke: [name] Other Clinician: Referring Bobbye Reinitz: [name] Treating Mayu Ronk/Extender: Skipper Cliche in Treatment: 34 Fall Risk Assessment Items Have you had 2 or more falls in the last 12 monthso 0 Yes Have you had any fall that resulted in injury in the last 12 monthso 0 No FALLS RISK SCREEN History of falling - immediate or within 3 months 25 Yes Secondary diagnosis (Do you have 2 or more medical diagnoseso) 0 No Ambulatory aid None/bed rest/wheelchair/nurse 0 No Crutches/cane/walker 15 Yes Furniture 0 No Intravenous therapy Access/Saline/Heparin Lock 0 No Gait/Transferring Normal/ bed rest/ wheelchair 0 No Weak (short steps with or without shuffle, stooped but able to lift head while walking, may 10 Yes seek support from furniture) Impaired (short steps with shuffle, may have difficulty arising from chair, head down, impaired 0 No balance) Mental Status Oriented to own ability 0 No Electronic Signature(s) Signed: 02/21/2021 1:13:48 PM By: Donnamarie Poag Entered ByDonnamarie Poag on 02/21/2021 13:13:48

## 2021-02-21 NOTE — Progress Notes (Signed)
Mazzola, Brenae L. ([id number]) Visit Report for 02/21/2021 Arrival Information Details Patient Name: Ramseyer, TIDA SANER. Date of Service: 02/21/2021 11:30 AM Medical Record Number: [id number] Patient Account Number: [id number] Date of Birth/Sex: 1927/01/13 (88 y.o. F) Treating RN: Donnamarie Poag Primary Care Madalyne Husk: [name] Other Clinician: Referring Tamanna Whitson: [name] Treating Mairlyn Tegtmeyer/Extender: Skipper Cliche in Treatment: 63 Visit Information History Since Last Visit Added or deleted any medications: No Patient Arrived: Wheel Chair Had a fall or experienced change in Yes Arrival Time: 11:30 activities of daily living that may affect Accompanied By: daughter risk of falls: Transfer Assistance: Manual Hospitalized since last visit: No Patient Identification Verified: Yes Has Dressing in Place as Prescribed: Yes Secondary Verification Process Completed: Yes Pain Present Now: No Patient Requires Transmission-Based No Precautions: Patient Has Alerts: Yes Patient Alerts: NOT diabetic ABI R1.18 L1.25 06/27/20 Electronic Signature(s) Signed: 02/21/2021 1:17:01 PM By: Donnamarie Poag Entered ByDonnamarie Poag on 02/21/2021 11:39:40 Goettel, Janica L. ([id number]) -------------------------------------------------------------------------------- Encounter Discharge Information Details Patient Name: Mines, Vedika L. Date of Service: 02/21/2021 11:30 AM Medical Record Number: [id number] Patient Account Number: [id number] Date of Birth/Sex: [birth year] (88 y.o. F) Treating RN: Donnamarie Poag Primary Care Shyheim Tanney: [name] Other Clinician: Referring Ariea Rochin: [name] Treating Gracie Gupta/Extender: Skipper Cliche in Treatment: 18 Encounter Discharge Information Items Post Procedure Vitals Discharge Condition: Stable Temperature (F): 98.6 Ambulatory Status: Wheelchair Pulse (bpm): 93 Discharge Destination: Home Respiratory Rate (breaths/min): 16 Transportation: Private  Auto Blood Pressure (mmHg): 114/71 Accompanied By: daughter Schedule Follow-up Appointment: Yes Clinical Summary of Care: Electronic Signature(s) Signed: 02/21/2021 1:17:01 PM By: Donnamarie Poag Entered ByDonnamarie Poag on 02/21/2021 12:24:06 Scholes, Madalin L. ([id number]) -------------------------------------------------------------------------------- Lower Extremity Assessment Details Patient Name: Constantine, Allyne L. Date of Service: 02/21/2021 11:30 AM Medical Record Number: [id number] Patient Account Number: [id number] Date of Birth/Sex: 03/27/1926 (88 y.o. F) Treating RN: Donnamarie Poag Primary Care Miyani Cronic: [name] Other Clinician: Referring Aiyana Stegmann: [name] Treating Trooper Olander/Extender: Skipper Cliche in Treatment: 34 Edema Assessment Assessed: [Left: Yes] [Right: No] Edema: [Left: N] [Right: o] Calf Left: Right: Point of Measurement: 32 cm From Medial Instep 31 cm Ankle Left: Right: Point of Measurement: 12 cm From Medial Instep 21 cm Electronic Signature(s) Signed: 02/21/2021 1:17:01 PM By: Donnamarie Poag Entered ByDonnamarie Poag on 02/21/2021 11:46:34 Macdonnell, Reydon ([id number]) -------------------------------------------------------------------------------- Multi Wound Chart Details Patient Name: Pawloski, Arlo L. Date of Service: 02/21/2021 11:30 AM Medical Record Number: [id number] Patient Account Number: [id number] Date of Birth/Sex: 09/23/1926 (88 y.o. F) Treating RN: Donnamarie Poag Primary Care Annika Selke: [name] Other Clinician: Referring Dartanion Teo: [name] Treating Jerris Fleer/Extender: Skipper Cliche in Treatment: 34 Vital Signs Height(in): 68 Pulse(bpm): 95 Weight(lbs): 137 Blood Pressure(mmHg): 114/71 Body Mass Index(BMI): 29 Temperature(F): 98.6 Respiratory Rate(breaths/min): 16 Photos: [N/A:N/A] Wound Location: Left Foot N/A N/A Wounding Event: Pressure Injury N/A N/A Primary Etiology: Pressure Ulcer N/A N/A Comorbid History:  Cataracts, Anemia, Coronary Artery N/A N/A Disease, Hypertension, Myocardial Infarction, End Stage Renal Disease, Gout, Osteoarthritis, Dementia Date Acquired: 10/02/2020 N/A N/A Weeks of Treatment: 20 N/A N/A Wound Status: Open N/A N/A Measurements L x W x D (cm) 0.4x0.3x0.3 N/A N/A Area (cm) : 0.094 N/A N/A Volume (cm) : 0.028 N/A N/A % Reduction in Area: 70.10% N/A N/A % Reduction in Volume: 9.70% N/A N/A Classification: Category/Stage III N/A N/A Exudate Amount: Medium N/A N/A Exudate Type: Serous N/A N/A Exudate Color: amber N/A N/A Wound Margin: Thickened N/A N/A Granulation Amount: Medium (34-66%) N/A N/A Granulation Quality: Red, Pink N/A N/A Necrotic  Amount: Medium (34-66%) N/A N/A Exposed Structures: Fat Layer (Subcutaneous Tissue): N/A N/A Yes Fascia: No Tendon: No Muscle: No Joint: No Bone: No Epithelialization: None N/A N/A Treatment Notes Electronic Signature(s) Signed: 02/21/2021 1:17:01 PM By: Donnamarie Poag Entered ByDonnamarie Poag on 02/21/2021 12:15:12 Ricco, Adrian. ([id number]) -------------------------------------------------------------------------------- Celina Details Patient Name: Bridgett, Onell L. Date of Service: 02/21/2021 11:30 AM Medical Record Number: [id number] Patient Account Number: [id number] Date of Birth/Sex: March 12, 1926 (88 y.o. F) Treating RN: Donnamarie Poag Primary Care Lusia Greis: [name] Other Clinician: Referring Hudson Majkowski: [name] Treating Shaunice Levitan/Extender: Skipper Cliche in Treatment: 34 Active Inactive Electronic Signature(s) Signed: 02/21/2021 1:17:01 PM By: Donnamarie Poag Entered By: Donnamarie Poag on 02/21/2021 11:46:44 Boening, Nichele L. ([id number]) -------------------------------------------------------------------------------- Pain Assessment Details Patient Name: Knepp, Samariah L. Date of Service: 02/21/2021 11:30 AM Medical Record Number: [id number] Patient Account Number: [id number] Date  of Birth/Sex: Oct 03, 1926 (88 y.o. F) Treating RN: Donnamarie Poag Primary Care Hallie Ishida: [name] Other Clinician: Referring Kasen Sako: [name] Treating Yeriel Mineo/Extender: Skipper Cliche in Treatment: 34 Active Problems Location of Pain Severity and Description of Pain Patient Has Paino No Site Locations Rate the pain. Current Pain Level: 0 Pain Management and Medication Current Pain Management: Electronic Signature(s) Signed: 02/21/2021 1:17:01 PM By: Donnamarie Poag Entered By: Donnamarie Poag on 02/21/2021 11:40:10 Seabolt, Romona L. ([id number]) -------------------------------------------------------------------------------- Patient/Caregiver Education Details Patient Name: Remlinger, Atisha L. Date of Service: 02/21/2021 11:30 AM Medical Record Number: [id number] Patient Account Number: [id number] Date of Birth/Gender: 01/07/1927 (88 y.o. F) Treating RN: Donnamarie Poag Primary Care Physician: [name] Other Clinician: Referring Physician: [name] Treating Physician/Extender: Skipper Cliche in Treatment: 93 Education Assessment Education Provided To: Patient and Caregiver Education Topics Provided Basic Hygiene: Wound/Skin Impairment: Electronic Signature(s) Signed: 02/21/2021 1:17:01 PM By: Donnamarie Poag Entered ByDonnamarie Poag on 02/21/2021 12:22:48 Meas, Bridney L. ([id number]) -------------------------------------------------------------------------------- Wound Assessment Details Patient Name: Wilds, Krystn L. Date of Service: 02/21/2021 11:30 AM Medical Record Number: [id number] Patient Account Number: [id number] Date of Birth/Sex: Jun 17, 1926 (88 y.o. F) Treating RN: Donnamarie Poag Primary Care Darrian Goodwill: [name] Other Clinician: Referring Zalman Hull: [name] Treating Shamon Cothran/Extender: Skipper Cliche in Treatment: 34 Wound Status Wound Number: 3 Primary Pressure Ulcer Etiology: Wound Location: Left Foot Wound Open Wounding Event: Pressure  Injury Status: Date Acquired: 10/02/2020 Comorbid Cataracts, Anemia, Coronary Artery Disease, Weeks Of Treatment: 20 History: Hypertension, Myocardial Infarction, End Stage Renal Clustered Wound: No Disease, Gout, Osteoarthritis, Dementia Photos Wound Measurements Length: (cm) 0.4 Width: (cm) 0.3 Depth: (cm) 0.3 Area: (cm) 0.094 Volume: (cm) 0.028 % Reduction in Area: 70.1% % Reduction in Volume: 9.7% Epithelialization: None Tunneling: No Undermining: No Wound Description Classification: Category/Stage III Wound Margin: Thickened Exudate Amount: Medium Exudate Type: Serous Exudate Color: amber Foul Odor After Cleansing: No Slough/Fibrino Yes Wound Bed Granulation Amount: Medium (34-66%) Exposed Structure Granulation Quality: Red, Pink Fascia Exposed: No Necrotic Amount: Medium (34-66%) Fat Layer (Subcutaneous Tissue) Exposed: Yes Necrotic Quality: Adherent Slough Tendon Exposed: No Muscle Exposed: No Joint Exposed: No Bone Exposed: No Treatment Notes Wound #3 (Foot) Wound Laterality: Left Cleanser Soap and Water Discharge Instruction: Gently cleanse wound with antibacterial soap, rinse and pat dry prior to dressing wounds Burnsworth, Latanja L. ([id number]) Peri-Wound Care Topical Primary Dressing Hydrofera Blue Ready Transfer Foam, 2.5x2.5 (in/in) Discharge Instruction: ONY ON TOP of wound only-Apply Hydrofera Blue Ready to wound bed as directed Secondary Dressing Zetuvit Plus Silicone Border Dressing 4x4 (in/in) Secured With Tubigrip Size D, 3x10 (in/yd) Compression Wrap Compression Stockings Add-Ons Electronic  Signature(s) Signed: 02/21/2021 1:17:01 PM By: Donnamarie Poag Entered By: Donnamarie Poag on 02/21/2021 11:44:51 Houdeshell, Vanderbilt ([id number]) -------------------------------------------------------------------------------- Vitals Details Patient Name: Brownrigg, Denise L. Date of Service: 02/21/2021 11:30 AM Medical Record Number: [id number] Patient Account  Number: [id number] Date of Birth/Sex: [birth year] (88 y.o. F) Treating RN: Donnamarie Poag Primary Care Debra Colon: [name] Other Clinician: Referring Deana Krock: [name] Treating Haron Beilke/Extender: Skipper Cliche in Treatment: 34 Vital Signs Time Taken: 11:35 Temperature (F): 98.6 Height (in): 58 Pulse (bpm): 93 Weight (lbs): 137 Respiratory Rate (breaths/min): 16 Body Mass Index (BMI): 28.6 Blood Pressure (mmHg): 114/71 Reference Range: 80 - 120 mg / dl Electronic Signature(s) Signed: 02/21/2021 1:17:01 PM By: Donnamarie Poag Entered ByDonnamarie Poag on 02/21/2021 11:40:00

## 2021-02-21 NOTE — Progress Notes (Addendum)
Belton, [name] ([id number]) Visit Report for 02/21/2021 Chief Complaint Document Details Patient Name: [name] [name], [name] Date of Service: 02/21/2021 11:30 AM Medical Record Number: [id number] Patient Account Number: [id number] Date of Birth/Sex: 1926/10/24 (88 y.o. F) Treating RN: [name] [name] Primary Care Provider: Lelon [name] Other Clinician: Referring Provider: Lelon [name] Treating Provider/Extender: [name] [name] in Treatment: 16 Information Obtained from: Patient Chief Complaint Left Heel Pressure Ulcer Electronic Signature(s) Signed: 02/21/2021 12:00:55 PM By: [name] Keeler PA-C Entered By: [name] [name] on 02/21/2021 12:00:55 Goodwill, [name] ([id number]) -------------------------------------------------------------------------------- Debridement Details Patient Name: [name] [name], [name] Date of Service: 02/21/2021 11:30 AM Medical Record Number: [id number] Patient Account Number: [id number] Date of Birth/Sex: May 08, 1926 (88 y.o. F) Treating RN: [name] [name] Primary Care Provider: Lelon [name] Other Clinician: Referring Provider: Lelon [name] Treating Provider/Extender: [name] [name] in Treatment: 34 Debridement Performed for Wound #3 Left Foot Assessment: Performed By: Physician [name] Sams., PA-C Debridement Type: Debridement Level of Consciousness (Pre- Awake and Alert procedure): Pre-procedure Verification/Time Out Yes - 12:14 Taken: Start Time: 12:15 Pain Control: Lidocaine Total Area Debrided (L x W): 0.5 (cm) x 0.4 (cm) = 0.2 (cm) Tissue and other material Viable, Non-Viable, Slough, Subcutaneous, Skin: Dermis , Slough debrided: Level: Skin/Subcutaneous Tissue Debridement Description: Excisional Instrument: Curette Bleeding: Moderate Hemostasis Achieved: Pressure Response to Treatment: Procedure was tolerated well Level of Consciousness (Post- Awake and Alert procedure): Post Debridement Measurements of Total Wound Length: (cm)  0.4 Stage: Category/Stage III Width: (cm) 0.3 Depth: (cm) 0.3 Volume: (cm) 0.028 Character of Wound/Ulcer Post Debridement: Improved Post Procedure Diagnosis Same as Pre-procedure Electronic Signature(s) Signed: 02/21/2021 1:17:01 PM By: [name] [name] Signed: 02/22/2021 5:21:10 PM By: [name] Keeler PA-C Entered By: [name] [name] on 02/21/2021 12:17:11 [name], [name] ([id number]) -------------------------------------------------------------------------------- HPI Details Patient Name: [name], [name] Date of Service: 02/21/2021 11:30 AM Medical Record Number: [id number] Patient Account Number: [id number] Date of Birth/Sex: 03-02-1926 (88 y.o. F) Treating RN: [name] [name] Primary Care Provider: Lelon [name] Other Clinician: Referring Provider: Lelon [name] Treating Provider/Extender: [name] [name] in Treatment: 63 History of Present Illness HPI Description: 88 year old patient was recently seen by the PCPs office for significant pain right great toe which has been going on since July. She was initially treated with Keflex which she did not complete and after the office visit this time she has been put on doxycycline. at the time of her visit she was found to have a ulcer on the plantar surface of the right great toe and also had a pyogenic granuloma over this area. X-ray of the right foot done 12/29/2014 -- IMPRESSION:Soft-tissue swelling and ulceration right great toe. No underlying bony lytic lesion identified. If osteomyelitis remains of clinical concern MRI can be obtained. Past medical history significant for anemia, chronic kidney disease stage III, obesity, varicose veins, coronary artery disease, gout, history of nicotine addiction given up smoking in 2002, hypertension, status post cardiac catheterization, status post abdominal hysterectomy, cholecystectomy and tonsillectomy. hemoglobin A1c done in August was 5.8 01/22/2015 -- at this stage the [facility] Walking boat was going  to cost them significant amount of money and they want to defer using that at the present time. 01/29/2015 -- she had a podiatry appointment and they have trimmed her toenails. She has not heard back from the vascular office regarding her venous duplex study and I have asked them to call personally so that they can get the appointment soon. 02/06/2015 -- they have made contact with the vascular office and from  what I understand that test has been done but the report is pending. 02/19/2014 -- the vascular test is scheduled for tomorrow Readmission: 06/26/2020 upon evaluation today patient appears for initial evaluation in her clinic that she has been here before in 2016 and has been quite sometime. She did have a fractured hip in February on the 23rd 2022. Subsequently this had to be pinned and she ended up with a pressure injury on her heel following when she was using her foot to help move her around in the bed. Subsequently this has led to the wound that she has been dealing with since that time. She lives at home with her daughter currently she does have dementia. 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 07/03/2020 upon evaluation today patient appears to be doing well with regard to her heel ulcer. She did have arterial studies they appear to be doing excellent it was premature normal across the board with TBI's in the 90s and ABIs well within normal range. Nonetheless there does not appear to be any signs of arterial insufficiency whatsoever. With that being said the patient does have also signs of improvement there is some necrotic tissue in the base of the wound number to try to clear some of that away today I do believe the Iodoflex/Iodosorb is doing well. 5/24; difficult punched out wound on the left medial heel. She has been using Iodoflex 07/17/2020 upon  evaluation today patient appears to be doing well at this point in regard to her wound. I do feel like this is a little bit deeper but again that is what is expected as we continue with the Iodoflex I think that this is just going to get deeper until we get to the base of the wound. With that being said I think we are getting much closer to the base of the wound where we can have healthier tissue that we get a be managing here which that will be awesome. In the meantime I am not surprised by what I am seeing and in fact the wound appears to be better as compared to previous findings. 07/24/2020 upon evaluation today patient appears to be doing well with regard to her wound. Overall I am extremely pleased with where things stand today. I do not see any signs of active infection which is great and overall I think that the patient is making good progress. There is good to be some need for sharp debridement today. 07/31/2020 upon evaluation today patient appears to be doing better in regard to her heel ulcer. She has been tolerating the dressing changes without complication. Fortunately there does not appear to be any signs of active infection which is great and overall very pleased in this regard. No fevers, chills, nausea, vomiting, or diarrhea. 08/14/2020 upon evaluation today patient appears to be doing better in regard to her heel ulcer. I am very pleased in that regard. Unfortunately she still has a slight deep tissue injury in regard to the medial portion of her foot over the same area. Unfortunately I think this is something that if were not careful it was can open up into the wound. That is my main concern here based on what I see. Fortunately there does not appear to be any evidence of active infection which is great news and overall very pleased with where things stand at this point. 08/21/2020 upon evaluation  today patient appears to be doing well with regard to her wound. She has been tolerating  the dressing changes without complication. Fortunately there does not appear to be any signs of active infection at this time. No fevers, chills, nausea, vomiting, or diarrhea. I do believe the compression wrap was beneficial for her. 08/28/2020 upon evaluation today patient appears to be doing well with regard to her wounds currently. Fortunately there does not appear to be any signs of active infection at this time. No fevers, chills, nausea, vomiting, or diarrhea. With that being said I think that her leg is doing quite well to be honest. 09/04/2020 upon evaluation today patient appears to be doing well with regard to her wound on the heel. This is showing signs of good epithelial growth although there is probably can it be an indention where this heals that is okay as long as we get it closed. Fortunately I do not see any evidence of infection at this point. 09/13/2020 upon evaluation today patient appears to be doing well with regard to her wounds. She has been tolerating the dressing changes Bienvenue, Irine L. ([id number]) without complication. Fortunately he is actually doing extremely well and I think she is making great progress this is measuring smaller. I would recommend that such that we continue with the [facility] likely since he is doing so well. 09/18/2020 upon evaluation today patient appears to be doing well with regard to her heel ulcer. She is making good progress and I am very pleased with what we see today. I think the Hydrofera Blue is still doing excellent. 10/02/2020 upon evaluation today patient's wound actually appears to be showing signs of good improvement in regard to the heel. I am very pleased in this regard. With that being said I do believe that the area which is somewhat been stable and dry is starting to lift up and beginning to clear this away as well that is on the foot. Subsequently I am going to have to perform some debridement here and we did actually go ahead  and allow this as a wound today as before it was just more deep tissue injury and eschar covering now I think it is a little bit more than that to be honest. 10/09/2020 upon evaluation today patient appears to be doing decently well in regard to her wounds. I am actually very pleased with where things stand today. There does not appear to be any signs of active infection which is great news. No fevers, chills, nausea, vomiting, or diarrhea. She is going require some sharp debridement today. 10/16/2020 upon evaluation today patient appears to be doing decently well in regard to her heel as well as the foot. Both are showing signs of good improvement which is great news. In general and extremely pleased with where things stand at this point. She is tolerating the [facility] well although this is getting so small in the heel I think collagen may be a better option here based on what I am seeing. With regard to the foot the Iodoflex/Iodosorb is still probably the best method here. 10/26/2020 upon evaluation today patient actually appears to be doing well in some respects today. She has been tolerating the dressing changes without complication and this is great news. The Hydrofera Blue is done well for the heel this actually appears to be healed today. With that being said in regard to the foot I think we are ready to switch to [facility] based on what I  see at this point. 10/30/2020 upon evaluation today patient appears to be doing well with regard to his wound. He has been tolerating the dressing changes without complication. Fortunately there does not appear to be any signs of active infection systemically at this time which is great news and overall very pleased with where things stand today. I do think that she is making progress although it somewhat slow still where showing signs of improvement little by little here. 11/06/2020 upon evaluation today patient appears to be doing decently well in  regard to her wound. We will start and see better overall appearance of the base of the wound which is great news she still continues to have some abnormal fluorescence signals with a MolecuLight DX that will be detailed below. 11/20/2020 upon evaluation today patient's wound actually showing signs of improvement. There is good to be some need for sharp debridement today and that was discussed with the patient. I am going to go ahead and clean this up but I do believe the [facility] is doing a great job. 11/27/2020; the patient had eschar over the original heel wound which I removed with a #3 curette there was nothing open here. The area is on the lateral left foot foot. 12/04/2020 upon evaluation today patient appears to be doing well with regard to her wound. The wound bed actually appeared to be doing well after I remove the dry endoform from the wound bed. This I tracked little bit of fluid but nonetheless underneath this appears to be doing awesome. I am very pleased with where things stand today. No fevers, chills, nausea, vomiting, or diarrhea. 12/11/2020 upon evaluation today patient appears to be doing well with regard to her wound although it keeps getting dry and filled up with the endoform I am not seeing any signs of infection but we are going to have to clean this away in order to try and get things moving in an appropriate direction. I discussed that with patient's daughter today as well. She is in agreement with proceeding as such. 12/21/2020 upon evaluation today patient appears to be doing well with regard to her wound this did not seal off like it was with the collagen but nonetheless also did not really feeling quite as much as I would like to have seen. I do believe that we may need to go ahead and see about doing a debridement today and I really feel like we may want to switch back to the [facility] which I felt like was doing better in the past. Good news is the base of  the wound appears healthy with good granulation tissue. 12/28/2020 upon evaluation patient's wound actually showed signs of doing quite well in regard to her foot. I think that we getting very close to complete resolution and overall I am extremely happy with where things stand today. 01/04/2021 upon evaluation today patient appears to be doing well with regard to her wound on the foot. This is good to require little bit of debridement today but overall seems to be doing quite well. I am actually very pleased with where we stand currently. 01/08/2021 upon evaluation today patient's wound actually showing signs of some improvement although she still has some callus buildup around the edges of the wound unfortunately I think this is continuing to rub despite what we are doing currently. I think that we may need to go ahead and see about switching up the dressing a little bit I Minna recommend collagen and  then on top of the collagen we will get a use some foam double layered cut in a doughnut to try to take pressure off of this area we will see how this does over the next week. 01/15/2021 upon evaluation today patient's wound is actually showing signs of being completely dried over with the collagen. This is definitely not what we are looking for. I think that the wound dried out too quickly is the main issue that we are running into at this point and I really think we may need to do something else try to keep that from occurring. 01/22/2021 upon evaluation today patient's wound actually did stay open it did not callus over as its been doing in the past this is good news. Also feel like it may not be quite as deep as what we have previously seen. There is still not as much improvement he does what I would like to see but nonetheless I think we are headed in the right direction. 01/29/2021 upon evaluation today patient appears to be doing okay in regard to her wound is not too dry but unfortunately is too  wet the Xeroform just did not do what I was hoping that she made things a little bit worse as far as size wise I am actually going to try at this point considering this is more open but not as deep the Hydrofera Blue to see if this will be beneficial at this time. Again we seem to be cycling through things and it is becoming a little difficult to get this to completely close but in the past Hydrofera Blue is what she did the best with 1 week to keep it from closing up prematurely. 02/05/2021 upon evaluation today patient's wound actually showing some signs of improvement which is great news. Fortunately I do not see any evidence of active infection locally nor systemically. It is definitely not too wet which is been the biggest issue we have seen up to this point over the past several weeks. Mastropietro, [name] ([id number]) 02/21/2021 upon evaluation today patient appears to be doing excellent in regard to her wound. This actually appears much improved compared to last time I saw her which is great news as well. No fevers, chills, nausea, vomiting, or diarrhea. Electronic Signature(s) Signed: 02/21/2021 1:52:25 PM By: [name] Keeler PA-C Entered By: [name] [name] on 02/21/2021 13:52:25 Coss, [name] ([id number]) -------------------------------------------------------------------------------- Physical Exam Details Patient Name: Mullinax, [name] Date of Service: 02/21/2021 11:30 AM Medical Record Number: [id number] Patient Account Number: [id number] Date of Birth/Sex: 06-18-1926 (88 y.o. F) Treating RN: [name] [name] Primary Care Provider: Lelon [name] Other Clinician: Referring Provider: Lelon [name] Treating Provider/Extender: [name] [name] in Treatment: 34 Constitutional Well-nourished and well-hydrated in no acute distress. Respiratory normal breathing without difficulty. Psychiatric this patient is able to make decisions and demonstrates good insight into disease process. Alert  and Oriented x 3. pleasant and cooperative. Notes Patient's wound bed did require some sharp debridement to clear away some of the necrotic debris she tolerated that debridement today without complication and postdebridement the wound bed appears to be doing much better. I am actually very pleased with how things appear currently. Electronic Signature(s) Signed: 02/21/2021 1:52:41 PM By: [name] Keeler PA-C Entered By: [name] [name] on 02/21/2021 13:52:41 Aung, [name] ([id number]) -------------------------------------------------------------------------------- Physician Orders Details Patient Name: Miranda, [name] Date of Service: 02/21/2021 11:30 AM Medical Record Number: [id number] Patient Account Number: [id number] Date of Birth/Sex: December 12, 1926 (88 y.o.  F) Treating RN: [name] [name] Primary Care Provider: Lelon [name] Other Clinician: Referring Provider: Lelon [name] Treating Provider/Extender: [name] [name] in Treatment: 47 Verbal / Phone Orders: No Diagnosis Coding ICD-10 Coding Code Description [phone] Pressure ulcer of other site, stage 3 I10 Essential (primary) hypertension F01.50 Vascular dementia without behavioral disturbance I25.10 Atherosclerotic heart disease of native coronary artery without angina pectoris Follow-up Appointments o Return Appointment in 1 week. o Nurse Visit as needed Bathing/ Shower/ Hygiene o May shower with wound dressing protected with water repellent cover or cast protector. o No tub bath. Anesthetic (Use 'Patient Medications' Section for Anesthetic Order Entry) o Lidocaine applied to wound bed Edema Control - Lymphedema / Segmental Compressive Device / Other o Optional: One layer of unna paste to top of compression wrap (to act as an anchor). o Tubigrip single layer applied. - left leg size D o Patient to wear own compression stockings. Remove compression stockings every night before going to bed and put on every  morning when getting up. - right leg o Elevate legs to the level of the heart and pump ankles as often as possible o Elevate leg(s) parallel to the floor when sitting. o DO YOUR BEST to sleep in the bed at night. DO NOT sleep in your recliner. Long hours of sitting in a recliner leads to swelling of the legs and/or potential wounds on your backside. Off-Loading o Open toe surgical shoe - left o Other: - Offloading boots while in bed; Try pillow under sheet to keep it in place. Additional Orders / Instructions o Follow Nutritious Diet and Increase Protein Intake Wound Treatment Wound #3 - Foot Wound Laterality: Left Cleanser: Soap and Water 2 x Per Week/30 Days Discharge Instructions: Gently cleanse wound with antibacterial soap, rinse and pat dry prior to dressing wounds Primary Dressing: Hydrofera Blue Ready Transfer Foam, 2.5x2.5 (in/in) 2 x Per Week/30 Days Discharge Instructions: ONY ON TOP of wound only-Apply Hydrofera Blue Ready to wound bed as directed Secondary Dressing: Zetuvit Plus Silicone Border Dressing 4x4 (in/in) 2 x Per Week/30 Days Secured With: Tubigrip Size D, 3x10 (in/yd) 2 x Per Week/30 Days Electronic Signature(s) Signed: 02/21/2021 1:17:01 PM By: [name] [name] Signed: 02/22/2021 5:21:10 PM By: [name] Keeler PA-C Entered By: [name] [name] on 02/21/2021 12:18:14 Lemus, Tylynn L. ([id number]) Colonna, [name] ([id number]) -------------------------------------------------------------------------------- Problem List Details Patient Name: Havrilla, [name] Date of Service: 02/21/2021 11:30 AM Medical Record Number: [id number] Patient Account Number: [id number] Date of Birth/Sex: 1926/06/28 (88 y.o. F) Treating RN: [name] [name] Primary Care Provider: Lelon [name] Other Clinician: Referring Provider: Lelon [name] Treating Provider/Extender: [name] [name] in Treatment: 75 Active Problems ICD-10 Encounter Code Description Active Date  MDM Diagnosis [phone] Pressure ulcer of other site, stage 3 10/02/2020 No Yes I10 Essential (primary) hypertension 06/26/2020 No Yes F01.50 Vascular dementia without behavioral disturbance 06/26/2020 No Yes I25.10 Atherosclerotic heart disease of native coronary artery without angina 06/26/2020 No Yes pectoris Inactive Problems Resolved Problems ICD-10 Code Description Active Date Resolved Date L89.623 Pressure ulcer of left heel, stage 3 06/26/2020 06/26/2020 Electronic Signature(s) Signed: 02/21/2021 12:00:39 PM By: [name] Keeler PA-C Entered By: [name] [name] on 02/21/2021 12:00:39 Detienne, [name] ([id number]) -------------------------------------------------------------------------------- Progress Note Details Patient Name: Jacob, [name] Date of Service: 02/21/2021 11:30 AM Medical Record Number: [id number] Patient Account Number: [id number] Date of Birth/Sex: 17-Aug-1926 (88 y.o. F) Treating RN: [name] [name] Primary Care Provider: Lelon [name] Other Clinician: Referring Provider: Lelon [name] Treating Provider/Extender: [name] [name] in Treatment:  34 Subjective Chief Complaint Information obtained from Patient Left Heel Pressure Ulcer History of Present Illness (HPI) 88 year old patient was recently seen by the PCPs office for significant pain right great toe which has been going on since July. She was initially treated with Keflex which she did not complete and after the office visit this time she has been put on doxycycline. at the time of her visit she was found to have a ulcer on the plantar surface of the right great toe and also had a pyogenic granuloma over this area. X-ray of the right foot done 12/29/2014 -- IMPRESSION:Soft-tissue swelling and ulceration right great toe. No underlying bony lytic lesion identified. If osteomyelitis remains of clinical concern MRI can be obtained. Past medical history significant for anemia, chronic kidney disease stage III,  obesity, varicose veins, coronary artery disease, gout, history of nicotine addiction given up smoking in 2002, hypertension, status post cardiac catheterization, status post abdominal hysterectomy, cholecystectomy and tonsillectomy. hemoglobin A1c done in August was 5.8 01/22/2015 -- at this stage the [facility] Walking boat was going to cost them significant amount of money and they want to defer using that at the present time. 01/29/2015 -- she had a podiatry appointment and they have trimmed her toenails. She has not heard back from the vascular office regarding her venous duplex study and I have asked them to call personally so that they can get the appointment soon. 02/06/2015 -- they have made contact with the vascular office and from what I understand that test has been done but the report is pending. 02/19/2014 -- the vascular test is scheduled for tomorrow Readmission: 06/26/2020 upon evaluation today patient appears for initial evaluation in her clinic that she has been here before in 2016 and has been quite sometime. She did have a fractured hip in February on the 23rd 2022. Subsequently this had to be pinned and she ended up with a pressure injury on her heel following when she was using her foot to help move her around in the bed. Subsequently this has led to the wound that she has been dealing with since that time. She lives at home with her daughter currently she does have dementia. 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 07/03/2020 upon evaluation today patient appears to be doing well with regard to her heel ulcer. She did have arterial studies they appear to be doing excellent it was premature normal across the board with TBI's in the 90s and ABIs well within normal range. Nonetheless there does not appear to be any signs of arterial insufficiency  whatsoever. With that being said the patient does have also signs of improvement there is some necrotic tissue in the base of the wound number to try to clear some of that away today I do believe the Iodoflex/Iodosorb is doing well. 5/24; difficult punched out wound on the left medial heel. She has been using Iodoflex 07/17/2020 upon evaluation today patient appears to be doing well at this point in regard to her wound. I do feel like this is a little bit deeper but again that is what is expected as we continue with the Iodoflex I think that this is just going to get deeper until we get to the base of the wound. With that being said I think we are getting much closer to the base of the wound where we can have healthier tissue  that we get a be managing here which that will be awesome. In the meantime I am not surprised by what I am seeing and in fact the wound appears to be better as compared to previous findings. 07/24/2020 upon evaluation today patient appears to be doing well with regard to her wound. Overall I am extremely pleased with where things stand today. I do not see any signs of active infection which is great and overall I think that the patient is making good progress. There is good to be some need for sharp debridement today. 07/31/2020 upon evaluation today patient appears to be doing better in regard to her heel ulcer. She has been tolerating the dressing changes without complication. Fortunately there does not appear to be any signs of active infection which is great and overall very pleased in this regard. No fevers, chills, nausea, vomiting, or diarrhea. 08/14/2020 upon evaluation today patient appears to be doing better in regard to her heel ulcer. I am very pleased in that regard. Unfortunately she still has a slight deep tissue injury in regard to the medial portion of her foot over the same area. Unfortunately I think this is something that if were not careful it was can open up  into the wound. That is my main concern here based on what I see. Fortunately there does not appear to be any evidence of active infection which is great news and overall very pleased with where things stand at this point. 08/21/2020 upon evaluation today patient appears to be doing well with regard to her wound. She has been tolerating the dressing changes without complication. Fortunately there does not appear to be any signs of active infection at this time. No fevers, chills, nausea, vomiting, or diarrhea. I do believe the compression wrap was beneficial for her. 08/28/2020 upon evaluation today patient appears to be doing well with regard to her wounds currently. Fortunately there does not appear to be any signs of active infection at this time. No fevers, chills, nausea, vomiting, or diarrhea. With that being said I think that her leg is doing quite well to be honest. Batterman, [name] ([id number]) 09/04/2020 upon evaluation today patient appears to be doing well with regard to her wound on the heel. This is showing signs of good epithelial growth although there is probably can it be an indention where this heals that is okay as long as we get it closed. Fortunately I do not see any evidence of infection at this point. 09/13/2020 upon evaluation today patient appears to be doing well with regard to her wounds. She has been tolerating the dressing changes without complication. Fortunately he is actually doing extremely well and I think she is making great progress this is measuring smaller. I would recommend that such that we continue with the [facility] likely since he is doing so well. 09/18/2020 upon evaluation today patient appears to be doing well with regard to her heel ulcer. She is making good progress and I am very pleased with what we see today. I think the Hydrofera Blue is still doing excellent. 10/02/2020 upon evaluation today patient's wound actually appears to be showing signs of  good improvement in regard to the heel. I am very pleased in this regard. With that being said I do believe that the area which is somewhat been stable and dry is starting to lift up and beginning to clear this away as well that is on the foot. Subsequently I am going to have to  perform some debridement here and we did actually go ahead and allow this as a wound today as before it was just more deep tissue injury and eschar covering now I think it is a little bit more than that to be honest. 10/09/2020 upon evaluation today patient appears to be doing decently well in regard to her wounds. I am actually very pleased with where things stand today. There does not appear to be any signs of active infection which is great news. No fevers, chills, nausea, vomiting, or diarrhea. She is going require some sharp debridement today. 10/16/2020 upon evaluation today patient appears to be doing decently well in regard to her heel as well as the foot. Both are showing signs of good improvement which is great news. In general and extremely pleased with where things stand at this point. She is tolerating the [facility] well although this is getting so small in the heel I think collagen may be a better option here based on what I am seeing. With regard to the foot the Iodoflex/Iodosorb is still probably the best method here. 10/26/2020 upon evaluation today patient actually appears to be doing well in some respects today. She has been tolerating the dressing changes without complication and this is great news. The Hydrofera Blue is done well for the heel this actually appears to be healed today. With that being said in regard to the foot I think we are ready to switch to [facility] based on what I see at this point. 10/30/2020 upon evaluation today patient appears to be doing well with regard to his wound. He has been tolerating the dressing changes without complication. Fortunately there does not appear to be  any signs of active infection systemically at this time which is great news and overall very pleased with where things stand today. I do think that she is making progress although it somewhat slow still where showing signs of improvement little by little here. 11/06/2020 upon evaluation today patient appears to be doing decently well in regard to her wound. We will start and see better overall appearance of the base of the wound which is great news she still continues to have some abnormal fluorescence signals with a MolecuLight DX that will be detailed below. 11/20/2020 upon evaluation today patient's wound actually showing signs of improvement. There is good to be some need for sharp debridement today and that was discussed with the patient. I am going to go ahead and clean this up but I do believe the [facility] is doing a great job. 11/27/2020; the patient had eschar over the original heel wound which I removed with a #3 curette there was nothing open here. The area is on the lateral left foot foot. 12/04/2020 upon evaluation today patient appears to be doing well with regard to her wound. The wound bed actually appeared to be doing well after I remove the dry endoform from the wound bed. This I tracked little bit of fluid but nonetheless underneath this appears to be doing awesome. I am very pleased with where things stand today. No fevers, chills, nausea, vomiting, or diarrhea. 12/11/2020 upon evaluation today patient appears to be doing well with regard to her wound although it keeps getting dry and filled up with the endoform I am not seeing any signs of infection but we are going to have to clean this away in order to try and get things moving in an appropriate direction. I discussed that with patient's daughter today as  well. She is in agreement with proceeding as such. 12/21/2020 upon evaluation today patient appears to be doing well with regard to her wound this did not seal off like it  was with the collagen but nonetheless also did not really feeling quite as much as I would like to have seen. I do believe that we may need to go ahead and see about doing a debridement today and I really feel like we may want to switch back to the [facility] which I felt like was doing better in the past. Good news is the base of the wound appears healthy with good granulation tissue. 12/28/2020 upon evaluation patient's wound actually showed signs of doing quite well in regard to her foot. I think that we getting very close to complete resolution and overall I am extremely happy with where things stand today. 01/04/2021 upon evaluation today patient appears to be doing well with regard to her wound on the foot. This is good to require little bit of debridement today but overall seems to be doing quite well. I am actually very pleased with where we stand currently. 01/08/2021 upon evaluation today patient's wound actually showing signs of some improvement although she still has some callus buildup around the edges of the wound unfortunately I think this is continuing to rub despite what we are doing currently. I think that we may need to go ahead and see about switching up the dressing a little bit I Minna recommend collagen and then on top of the collagen we will get a use some foam double layered cut in a doughnut to try to take pressure off of this area we will see how this does over the next week. 01/15/2021 upon evaluation today patient's wound is actually showing signs of being completely dried over with the collagen. This is definitely not what we are looking for. I think that the wound dried out too quickly is the main issue that we are running into at this point and I really think we may need to do something else try to keep that from occurring. 01/22/2021 upon evaluation today patient's wound actually did stay open it did not callus over as its been doing in the past this is good news.  Also feel like it may not be quite as deep as what we have previously seen. There is still not as much improvement he does what I would like to see but nonetheless I think we are headed in the right direction. 01/29/2021 upon evaluation today patient appears to be doing okay in regard to her wound is not too dry but unfortunately is too wet the Xeroform just did not do what I was hoping that she made things a little bit worse as far as size wise I am actually going to try at this point considering this is more open but not as deep the Hydrofera Blue to see if this will be beneficial at this time. Again we seem to be cycling through things and it is becoming a little difficult to get this to completely close but in the past Hydrofera Blue is what she did the best with 1 week to keep Rabon, [name] ([id number]) it from closing up prematurely. 02/05/2021 upon evaluation today patient's wound actually showing some signs of improvement which is great news. Fortunately I do not see any evidence of active infection locally nor systemically. It is definitely not too wet which is been the biggest issue we have seen up to this  point over the past several weeks. 02/21/2021 upon evaluation today patient appears to be doing excellent in regard to her wound. This actually appears much improved compared to last time I saw her which is great news as well. No fevers, chills, nausea, vomiting, or diarrhea. Objective Constitutional Well-nourished and well-hydrated in no acute distress. Vitals Time Taken: 11:35 AM, Height: 58 in, Weight: 137 lbs, BMI: 28.6, Temperature: 98.6 F, Pulse: 93 bpm, Respiratory Rate: 16 breaths/min, Blood Pressure: 114/71 mmHg. Respiratory normal breathing without difficulty. Psychiatric this patient is able to make decisions and demonstrates good insight into disease process. Alert and Oriented x 3. pleasant and cooperative. General Notes: Patient's wound bed did require some sharp  debridement to clear away some of the necrotic debris she tolerated that debridement today without complication and postdebridement the wound bed appears to be doing much better. I am actually very pleased with how things appear currently. Integumentary (Hair, Skin) Wound #3 status is Open. Original cause of wound was Pressure Injury. The date acquired was: 10/02/2020. The wound has been in treatment 20 weeks. The wound is located on the Left Foot. The wound measures 0.4cm length x 0.3cm width x 0.3cm depth; 0.094cm^2 area and 0.028cm^3 volume. There is Fat Layer (Subcutaneous Tissue) exposed. There is no tunneling or undermining noted. There is a medium amount of serous drainage noted. The wound margin is thickened. There is medium (34-66%) red, pink granulation within the wound bed. There is a medium (34- 66%) amount of necrotic tissue within the wound bed including Adherent Slough. Assessment Active Problems ICD-10 Pressure ulcer of other site, stage 3 Essential (primary) hypertension Vascular dementia without behavioral disturbance Atherosclerotic heart disease of native coronary artery without angina pectoris Procedures Wound #3 Pre-procedure diagnosis of Wound #3 is a Pressure Ulcer located on the Left Foot . There was a Excisional Skin/Subcutaneous Tissue Debridement with a total area of 0.2 sq cm performed by [name] Sams., PA-C. With the following instrument(s): Curette to remove Viable and Non-Viable tissue/material. Material removed includes Subcutaneous Tissue, Slough, and Skin: Dermis after achieving pain control using Lidocaine. A time out was conducted at 12:14, prior to the start of the procedure. A Moderate amount of bleeding was controlled with Pressure. The procedure was tolerated well. Post Debridement Measurements: 0.4cm length x 0.3cm width x 0.3cm depth; 0.028cm^3 volume. Post debridement Stage noted as Category/Stage III. Character of Wound/Ulcer Post Debridement is  improved. Post procedure Diagnosis Wound #3: Same as Pre-Procedure Ostrander, [name] ([id number]) Plan Follow-up Appointments: Return Appointment in 1 week. Nurse Visit as needed Bathing/ Shower/ Hygiene: May shower with wound dressing protected with water repellent cover or cast protector. No tub bath. Anesthetic (Use 'Patient Medications' Section for Anesthetic Order Entry): Lidocaine applied to wound bed Edema Control - Lymphedema / Segmental Compressive Device / Other: Optional: One layer of unna paste to top of compression wrap (to act as an anchor). Tubigrip single layer applied. - left leg size D Patient to wear own compression stockings. Remove compression stockings every night before going to bed and put on every morning when getting up. - right leg Elevate legs to the level of the heart and pump ankles as often as possible Elevate leg(s) parallel to the floor when sitting. DO YOUR BEST to sleep in the bed at night. DO NOT sleep in your recliner. Long hours of sitting in a recliner leads to swelling of the legs and/or potential wounds on your backside. Off-Loading: Open toe surgical shoe - left Other: -  Offloading boots while in bed; Try pillow under sheet to keep it in place. Additional Orders / Instructions: Follow Nutritious Diet and Increase Protein Intake WOUND #3: - Foot Wound Laterality: Left Cleanser: Soap and Water 2 x Per Week/30 Days Discharge Instructions: Gently cleanse wound with antibacterial soap, rinse and pat dry prior to dressing wounds Primary Dressing: Hydrofera Blue Ready Transfer Foam, 2.5x2.5 (in/in) 2 x Per Week/30 Days Discharge Instructions: ONY ON TOP of wound only-Apply Hydrofera Blue Ready to wound bed as directed Secondary Dressing: Zetuvit Plus Silicone Border Dressing 4x4 (in/in) 2 x Per Week/30 Days Secured With: Tubigrip Size D, 3x10 (in/yd) 2 x Per Week/30 Days 1. I am going to suggest we continue with the [facility] as I feel like  that still doing a great job. 2. I am also can recommend that the patient continue with the Tubigrip I think I am much happier with the way that looks and in fact I am wondering if the compression wrap was putting some pressure on her foot as well that could have been causing some problems here. We will see patient back for reevaluation in 1 week here in the clinic. If anything worsens or changes patient will contact our office for additional recommendations. Electronic Signature(s) Signed: 02/21/2021 1:54:26 PM By: [name] Keeler PA-C Entered By: [name] [name] on 02/21/2021 13:54:26 Mersereau, [name] ([id number]) -------------------------------------------------------------------------------- SuperBill Details Patient Name: Wainright, [name] Date of Service: 02/21/2021 Medical Record Number: [id number] Patient Account Number: [id number] Date of Birth/Sex: 1926-06-23 (88 y.o. F) Treating RN: [name] [name] Primary Care Provider: Lelon [name] Other Clinician: Referring Provider: Lelon [name] Treating Provider/Extender: [name] [name] in Treatment: 34 Diagnosis Coding ICD-10 Codes Code Description [phone] Pressure ulcer of other site, stage 3 I10 Essential (primary) hypertension I25.10 Atherosclerotic heart disease of native coronary artery without angina pectoris F01.B0 Vascular dementia, moderate, without behavioral disturbance, psychotic disturbance, mood disturbance, and anxiety Facility Procedures CPT4 Code: [id number] Description: 03888 - DEB SUBQ TISSUE 20 SQ CM/< Modifier: Quantity: 1 CPT4 Code: Description: ICD-10 Diagnosis Description L89.893 Pressure ulcer of other site, stage 3 Modifier: Quantity: Physician Procedures CPT4 Code: [id number] Description: 17915 - WC PHYS SUBQ TISS 20 SQ CM Modifier: Quantity: 1 CPT4 Code: Description: ICD-10 Diagnosis Description L89.893 Pressure ulcer of other site, stage 3 Modifier: Quantity: Electronic Signature(s) Signed:  02/21/2021 1:54:50 PM By: [name] Keeler PA-C Previous Signature: 02/21/2021 1:15:36 PM Version By: [name] [name] Previous Signature: 02/21/2021 1:15:00 PM Version By: [name] [name] Previous Signature: 02/21/2021 1:14:23 PM Version By: [name] [name] Entered By: [name] [name] on 02/21/2021 13:54:50

## 2021-02-28 ENCOUNTER — Encounter: Payer: PPO | Admitting: Physician Assistant

## 2021-02-28 ENCOUNTER — Other Ambulatory Visit: Payer: Self-pay

## 2021-02-28 DIAGNOSIS — L89893 Pressure ulcer of other site, stage 3: Secondary | ICD-10-CM | POA: Diagnosis not present

## 2021-02-28 DIAGNOSIS — I10 Essential (primary) hypertension: Secondary | ICD-10-CM | POA: Diagnosis not present

## 2021-02-28 DIAGNOSIS — I2510 Atherosclerotic heart disease of native coronary artery without angina pectoris: Secondary | ICD-10-CM | POA: Diagnosis not present

## 2021-02-28 NOTE — Progress Notes (Addendum)
Talton, Ladeana L. ([id number]) Visit Report for 02/28/2021 Arrival Information Details Patient Name: [name], [name] Date of Service: 02/28/2021 11:30 AM Medical Record Number: [id number] Patient Account Number: [id number] Date of Birth/Sex: 1926-09-05 (88 y.o. F) Treating RN: Donnamarie Poag Primary Care Lekita Kerekes: [name] Other Clinician: Referring Ray Gervasi: [name] Treating Kaymarie Wynn/Extender: Skipper Cliche in Treatment: 2 Visit Information History Since Last Visit Added or deleted any medications: No Patient Arrived: Wheel Chair Had a fall or experienced change in No Arrival Time: 11:33 activities of daily living that may affect Accompanied By: daughter risk of falls: Transfer Assistance: EasyPivot Patient Lift Hospitalized since last visit: No Patient Identification Verified: Yes Has Dressing in Place as Prescribed: Yes Secondary Verification Process Completed: Yes Pain Present Now: No Patient Requires Transmission-Based No Precautions: Patient Has Alerts: Yes Patient Alerts: NOT diabetic ABI R1.18 L1.25 06/27/20 Electronic Signature(s) Signed: 03/01/2021 4:02:12 PM By: Donnamarie Poag Entered ByDonnamarie Poag on 02/28/2021 11:35:51 [name], [name] ([id number]) -------------------------------------------------------------------------------- Clinic Level of Care Assessment Details Patient Name: [name], [name] Date of Service: 02/28/2021 11:30 AM Medical Record Number: [id number] Patient Account Number: [id number] Date of Birth/Sex: February 19, 1926 (88 y.o. F) Treating RN: Donnamarie Poag Primary Care Teaghan Formica: [name] Other Clinician: Referring Azalynn Maxim: [name] Treating Aleen Marston/Extender: Skipper Cliche in Treatment: 35 Clinic Level of Care Assessment Items TOOL 4 Quantity Score []  - Use when only an EandM is performed on FOLLOW-UP visit 0 ASSESSMENTS - Nursing Assessment / Reassessment []  - Reassessment of Co-morbidities (includes updates in  patient status) 0 []  - 0 Reassessment of Adherence to Treatment Plan ASSESSMENTS - Wound and Skin Assessment / Reassessment X - Simple Wound Assessment / Reassessment - one wound 1 5 []  - 0 Complex Wound Assessment / Reassessment - multiple wounds []  - 0 Dermatologic / Skin Assessment (not related to wound area) ASSESSMENTS - Focused Assessment []  - Circumferential Edema Measurements - multi extremities 0 []  - 0 Nutritional Assessment / Counseling / Intervention []  - 0 Lower Extremity Assessment (monofilament, tuning fork, pulses) []  - 0 Peripheral Arterial Disease Assessment (using hand held doppler) ASSESSMENTS - Ostomy and/or Continence Assessment and Care []  - Incontinence Assessment and Management 0 []  - 0 Ostomy Care Assessment and Management (repouching, etc.) PROCESS - Coordination of Care X - Simple Patient / Family Education for ongoing care 1 15 []  - 0 Complex (extensive) Patient / Family Education for ongoing care []  - 0 Staff obtains Programmer, systems, Records, Test Results / Process Orders []  - 0 Staff telephones HHA, Nursing Homes / Clarify orders / etc []  - 0 Routine Transfer to another Facility (non-emergent condition) []  - 0 Routine Hospital Admission (non-emergent condition) []  - 0 New Admissions / Biomedical engineer / Ordering NPWT, Apligraf, etc. []  - 0 Emergency Hospital Admission (emergent condition) X- 1 10 Simple Discharge Coordination []  - 0 Complex (extensive) Discharge Coordination PROCESS - Special Needs []  - Pediatric / Minor Patient Management 0 []  - 0 Isolation Patient Management []  - 0 Hearing / Language / Visual special needs []  - 0 Assessment of Community assistance (transportation, D/C planning, etc.) []  - 0 Additional assistance / Altered mentation []  - 0 Support Surface(s) Assessment (bed, cushion, seat, etc.) INTERVENTIONS - Wound Cleansing / Measurement [name], [name] ([id number]) X- 1 5 Simple Wound Cleansing - one  wound []  - 0 Complex Wound Cleansing - multiple wounds X- 1 5 Wound Imaging (photographs - any number of wounds) []  - 0 Wound Tracing (instead of photographs) X- 1 5 Simple Wound  Measurement - one wound []  - 0 Complex Wound Measurement - multiple wounds INTERVENTIONS - Wound Dressings X - Small Wound Dressing one or multiple wounds 1 10 []  - 0 Medium Wound Dressing one or multiple wounds []  - 0 Large Wound Dressing one or multiple wounds X- 1 5 Application of Medications - topical []  - 0 Application of Medications - injection INTERVENTIONS - Miscellaneous []  - External ear exam 0 []  - 0 Specimen Collection (cultures, biopsies, blood, body fluids, etc.) []  - 0 Specimen(s) / Culture(s) sent or taken to Lab for analysis []  - 0 Patient Transfer (multiple staff / Civil Service fast streamer / Similar devices) []  - 0 Simple Staple / Suture removal (25 or less) []  - 0 Complex Staple / Suture removal (26 or more) []  - 0 Hypo / Hyperglycemic Management (close monitor of Blood Glucose) []  - 0 Ankle / Brachial Index (ABI) - do not check if billed separately X- 1 5 Vital Signs Has the patient been seen at the hospital within the last three years: Yes Total Score: 65 Level Of Care: New/Established - Level 2 Electronic Signature(s) Signed: 03/01/2021 4:02:12 PM By: Donnamarie Poag Entered ByDonnamarie Poag on 02/28/2021 11:48:36 [name], [name] ([id number]) -------------------------------------------------------------------------------- Encounter Discharge Information Details Patient Name: [name], [name] Date of Service: 02/28/2021 11:30 AM Medical Record Number: [id number] Patient Account Number: [id number] Date of Birth/Sex: 08/06/1926 (88 y.o. F) Treating RN: Donnamarie Poag Primary Care Jacari Iannello: [name] Other Clinician: Referring Khyler Eschmann: [name] Treating Archit Leger/Extender: Skipper Cliche in Treatment: 44 Encounter Discharge Information Items Discharge Condition:  Stable Ambulatory Status: Wheelchair Discharge Destination: Home Transportation: Private Auto Accompanied By: daughter Schedule Follow-up Appointment: Yes Clinical Summary of Care: Electronic Signature(s) Signed: 03/01/2021 4:02:12 PM By: Donnamarie Poag Entered By: Donnamarie Poag on 02/28/2021 13:04:43 Wooden, [place]. ([id number]) -------------------------------------------------------------------------------- Lower Extremity Assessment Details Patient Name: [name], [name] Date of Service: 02/28/2021 11:30 AM Medical Record Number: [id number] Patient Account Number: [id number] Date of Birth/Sex: 12-11-1926 (88 y.o. F) Treating RN: Donnamarie Poag Primary Care Vondell Babers: [name] Other Clinician: Referring Jaclyne Haverstick: [name] Treating Bion Todorov/Extender: Skipper Cliche in Treatment: 35 Edema Assessment Assessed: Shirlyn Goltz: Yes] [Right: No] Edema: [Left: N] [Right: o] Vascular Assessment Pulses: Dorsalis Pedis Palpable: [Left:Yes] Electronic Signature(s) Signed: 03/01/2021 4:02:12 PM By: Donnamarie Poag Entered By: Donnamarie Poag on 02/28/2021 11:40:38 Bowland, [name] ([id number]) -------------------------------------------------------------------------------- Multi Wound Chart Details Patient Name: [name], [name] Date of Service: 02/28/2021 11:30 AM Medical Record Number: [id number] Patient Account Number: [id number] Date of Birth/Sex: [birth year] (88 y.o. F) Treating RN: Donnamarie Poag Primary Care Jaymeson Mengel: [name] Other Clinician: Referring Ahmadou Bolz: [name] Treating Koa Zoeller/Extender: Skipper Cliche in Treatment: 35 Vital Signs Height(in): 28 Pulse(bpm): 64 Weight(lbs): 137 Blood Pressure(mmHg): 128/81 Body Mass Index(BMI): 29 Temperature(F): 98.4 Respiratory Rate(breaths/min): 16 Photos: [N/A:N/A] Wound Location: Left Foot N/A N/A Wounding Event: Pressure Injury N/A N/A Primary Etiology: Pressure Ulcer N/A N/A Comorbid History: Cataracts, Anemia,  Coronary Artery N/A N/A Disease, Hypertension, Myocardial Infarction, End Stage Renal Disease, Gout, Osteoarthritis, Dementia Date Acquired: 10/02/2020 N/A N/A Weeks of Treatment: 21 N/A N/A Wound Status: Open N/A N/A Measurements L x W x D (cm) 0.2x0.2x0.3 N/A N/A Area (cm) : 0.031 N/A N/A Volume (cm) : 0.009 N/A N/A % Reduction in Area: 90.10% N/A N/A % Reduction in Volume: 71.00% N/A N/A Classification: Category/Stage III N/A N/A Exudate Amount: Medium N/A N/A Exudate Type: Serous N/A N/A Exudate Color: amber N/A N/A Wound Margin: Thickened N/A N/A Granulation Amount: Large (67-100%) N/A N/A  Granulation Quality: Red, Pink N/A N/A Necrotic Amount: Small (1-33%) N/A N/A Exposed Structures: Fat Layer (Subcutaneous Tissue): N/A N/A Yes Fascia: No Tendon: No Muscle: No Joint: No Bone: No Epithelialization: None N/A N/A Treatment Notes Electronic Signature(s) Signed: 03/01/2021 4:02:12 PM By: Donnamarie Poag Entered ByDonnamarie Poag on 02/28/2021 11:41:37 Mennella, [place] ([id number]) -------------------------------------------------------------------------------- [place] Details Patient Name: [name], [name] Date of Service: 02/28/2021 11:30 AM Medical Record Number: [id number] Patient Account Number: [id number] Date of Birth/Sex: 1926-07-16 (88 y.o. F) Treating RN: Donnamarie Poag Primary Care Elton Heid: [name] Other Clinician: Referring Sayf Kerner: [name] Treating Jameela Michna/Extender: Skipper Cliche in Treatment: 35 Active Inactive Electronic Signature(s) Signed: 03/01/2021 4:02:12 PM By: Donnamarie Poag Entered ByDonnamarie Poag on 02/28/2021 11:41:06 Quitman, [place] ([id number]) -------------------------------------------------------------------------------- Pain Assessment Details Patient Name: [name], [name] Date of Service: 02/28/2021 11:30 AM Medical Record Number: [id number] Patient Account Number: [id number] Date of Birth/Sex:  25-Sep-1926 (88 y.o. F) Treating RN: Donnamarie Poag Primary Care Heavenly Christine: [name] Other Clinician: Referring Perrie Ragin: [name] Treating Corda Shutt/Extender: Skipper Cliche in Treatment: 35 Active Problems Location of Pain Severity and Description of Pain Patient Has Paino No Site Locations Rate the pain. Current Pain Level: 0 Pain Management and Medication Current Pain Management: Electronic Signature(s) Signed: 03/01/2021 4:02:12 PM By: Donnamarie Poag Entered By: Donnamarie Poag on 02/28/2021 11:36:50 [name], [name] ([id number]) -------------------------------------------------------------------------------- Patient/Caregiver Education Details Patient Name: [name], [name] Date of Service: 02/28/2021 11:30 AM Medical Record Number: [id number] Patient Account Number: [id number] Date of Birth/Gender: 1926-11-09 (88 y.o. F) Treating RN: Donnamarie Poag Primary Care Physician: [name] Other Clinician: Referring Physician: [name] Treating Physician/Extender: Skipper Cliche in Treatment: 14 Education Assessment Education Provided To: Caregiver Education Topics Provided Basic Hygiene: Wound/Skin Impairment: Electronic Signature(s) Signed: 03/01/2021 4:02:12 PM By: Donnamarie Poag Entered ByDonnamarie Poag on 02/28/2021 11:46:53 [place], [place] ([id number]) -------------------------------------------------------------------------------- Wound Assessment Details Patient Name: [name], [name] Date of Service: 02/28/2021 11:30 AM Medical Record Number: [id number] Patient Account Number: [id number] Date of Birth/Sex: 1926-05-27 (88 y.o. F) Treating RN: Donnamarie Poag Primary Care Max Nuno: [name] Other Clinician: Referring Elias Bordner: [name] Treating Wyoma Genson/Extender: Skipper Cliche in Treatment: 35 Wound Status Wound Number: 3 Primary Pressure Ulcer Etiology: Wound Location: Left Foot Wound Open Wounding Event: Pressure Injury Status: Date  Acquired: 10/02/2020 Comorbid Cataracts, Anemia, Coronary Artery Disease, Weeks Of Treatment: 21 History: Hypertension, Myocardial Infarction, End Stage Renal Clustered Wound: No Disease, Gout, Osteoarthritis, Dementia Photos Wound Measurements Length: (cm) 0.2 Width: (cm) 0.2 Depth: (cm) 0.3 Area: (cm) 0.031 Volume: (cm) 0.009 % Reduction in Area: 90.1% % Reduction in Volume: 71% Epithelialization: None Tunneling: No Undermining: No Wound Description Classification: Category/Stage III Wound Margin: Thickened Exudate Amount: Medium Exudate Type: Serous Exudate Color: amber Foul Odor After Cleansing: No Slough/Fibrino Yes Wound Bed Granulation Amount: Large (67-100%) Exposed Structure Granulation Quality: Red, Pink Fascia Exposed: No Necrotic Amount: Small (1-33%) Fat Layer (Subcutaneous Tissue) Exposed: Yes Necrotic Quality: Adherent Slough Tendon Exposed: No Muscle Exposed: No Joint Exposed: No Bone Exposed: No Treatment Notes Wound #3 (Foot) Wound Laterality: Left Cleanser Soap and Water Discharge Instruction: Gently cleanse wound with antibacterial soap, rinse and pat dry prior to dressing wounds [name], [name] ([id number]) Peri-Wound Care Topical Primary Dressing Hydrofera Blue Ready Transfer Foam, 2.5x2.5 (in/in) Discharge Instruction: ONY ON TOP of wound only-Apply Hydrofera Blue Ready to wound bed as directed Secondary Dressing Zetuvit Plus Silicone Border Dressing 4x4 (in/in) Secured With Tubigrip Size D, 3x10 (in/yd) Compression  Wrap Compression Stockings Add-Ons Electronic Signature(s) Signed: 03/01/2021 4:02:12 PM By: Donnamarie Poag Entered By: Donnamarie Poag on 02/28/2021 11:40:11 [name], [name] ([id number]) -------------------------------------------------------------------------------- Vitals Details Patient Name: [name], [name] Date of Service: 02/28/2021 11:30 AM Medical Record Number: [id number] Patient Account Number: [id number] Date  of Birth/Sex: August 19, 1926 (88 y.o. F) Treating RN: Donnamarie Poag Primary Care Donald Jacque: [name] Other Clinician: Referring Kishan Wachsmuth: [name] Treating Navdeep Halt/Extender: Skipper Cliche in Treatment: 35 Vital Signs Time Taken: 11:35 Temperature (F): 98.4 Height (in): 58 Pulse (bpm): 92 Weight (lbs): 137 Respiratory Rate (breaths/min): 16 Body Mass Index (BMI): 28.6 Blood Pressure (mmHg): 128/81 Reference Range: 80 - 120 mg / dl Electronic Signature(s) Signed: 03/01/2021 4:02:12 PM By: Donnamarie Poag Entered ByDonnamarie Poag on 02/28/2021 11:36:05

## 2021-02-28 NOTE — Progress Notes (Addendum)
Kistner, Elbia L. ([id number]) Visit Report for 02/28/2021 Chief Complaint Document Details Patient Name: [name], [name] Date of Service: 02/28/2021 11:30 AM Medical Record Number: [id number] Patient Account Number: [id number] Date of Birth/Sex: 12-22-1926 (88 y.o. F) Treating RN: Donnamarie Poag Primary Care Provider: [name] Other Clinician: Referring Provider: [name] Treating Provider/Extender: Skipper Cliche in Treatment: 12 Information Obtained from: Patient Chief Complaint Left Heel Pressure Ulcer Electronic Signature(s) Signed: 02/28/2021 11:30:14 AM By: Worthy Keeler PA-C Entered By: Worthy Keeler on 02/28/2021 11:30:14 [name], [name] ([id number]) -------------------------------------------------------------------------------- HPI Details Patient Name: [name], [name] Date of Service: 02/28/2021 11:30 AM Medical Record Number: [id number] Patient Account Number: [id number] Date of Birth/Sex: February 03, 1927 (88 y.o. F) Treating RN: Donnamarie Poag Primary Care Provider: [name] Other Clinician: Referring Provider: [name] Treating Provider/Extender: Skipper Cliche in Treatment: 35 History of Present Illness HPI Description: 88 year old patient was recently seen by the PCPs office for significant pain right great toe which has been going on since July. She was initially treated with Keflex which she did not complete and after the office visit this time she has been put on doxycycline. at the time of her visit she was found to have a ulcer on the plantar surface of the right great toe and also had a pyogenic granuloma over this area. X-ray of the right foot done 12/29/2014 -- IMPRESSION:Soft-tissue swelling and ulceration right great toe. No underlying bony lytic lesion identified. If osteomyelitis remains of clinical concern MRI can be obtained. Past medical history significant for anemia, chronic kidney disease stage III, obesity, varicose veins,  coronary artery disease, gout, history of nicotine addiction given up smoking in 2002, hypertension, status post cardiac catheterization, status post abdominal hysterectomy, cholecystectomy and tonsillectomy. hemoglobin A1c done in August was 5.8 01/22/2015 -- at this stage the [facility] Walking boat was going to cost them significant amount of money and they want to defer using that at the present time. 01/29/2015 -- she had a podiatry appointment and they have trimmed her toenails. She has not heard back from the vascular office regarding her venous duplex study and I have asked them to call personally so that they can get the appointment soon. 02/06/2015 -- they have made contact with the vascular office and from what I understand that test has been done but the report is pending. 02/19/2014 -- the vascular test is scheduled for tomorrow Readmission: 06/26/2020 upon evaluation today patient appears for initial evaluation in her clinic that she has been here before in 2016 and has been quite sometime. She did have a fractured hip in February on the 23rd 2022. Subsequently this had to be pinned and she ended up with a pressure injury on her heel following when she was using her foot to help move her around in the bed. Subsequently this has led to the wound that she has been dealing with since that time. She lives at home with her daughter currently she does have dementia. 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 07/03/2020 upon evaluation today patient appears to be doing well with regard to her heel ulcer. She did have arterial studies they appear to be doing excellent it was premature normal across the board with TBI's in the 90s and ABIs well within normal range. Nonetheless there does not appear to be any signs of arterial insufficiency whatsoever. With that being said  the patient does have also signs of improvement there is some necrotic tissue in the base of the wound number to try to clear some of that away today I do believe the Iodoflex/Iodosorb is doing well. 5/24; difficult punched out wound on the left medial heel. She has been using Iodoflex 07/17/2020 upon evaluation today patient appears to be doing well at this point in regard to her wound. I do feel like this is a little bit deeper but again that is what is expected as we continue with the Iodoflex I think that this is just going to get deeper until we get to the base of the wound. With that being said I think we are getting much closer to the base of the wound where we can have healthier tissue that we get a be managing here which that will be awesome. In the meantime I am not surprised by what I am seeing and in fact the wound appears to be better as compared to previous findings. 07/24/2020 upon evaluation today patient appears to be doing well with regard to her wound. Overall I am extremely pleased with where things stand today. I do not see any signs of active infection which is great and overall I think that the patient is making good progress. There is good to be some need for sharp debridement today. 07/31/2020 upon evaluation today patient appears to be doing better in regard to her heel ulcer. She has been tolerating the dressing changes without complication. Fortunately there does not appear to be any signs of active infection which is great and overall very pleased in this regard. No fevers, chills, nausea, vomiting, or diarrhea. 08/14/2020 upon evaluation today patient appears to be doing better in regard to her heel ulcer. I am very pleased in that regard. Unfortunately she still has a slight deep tissue injury in regard to the medial portion of her foot over the same area. Unfortunately I think this is something that if were not careful it was can open up into the wound. That is my main  concern here based on what I see. Fortunately there does not appear to be any evidence of active infection which is great news and overall very pleased with where things stand at this point. 08/21/2020 upon evaluation today patient appears to be doing well with regard to her wound. She has been tolerating the dressing changes without complication. Fortunately there does not appear to be any signs of active infection at this time. No fevers, chills, nausea, vomiting, or diarrhea. I do believe the compression wrap was beneficial for her. 08/28/2020 upon evaluation today patient appears to be doing well with regard to her wounds currently. Fortunately there does not appear to be any signs of active infection at this time. No fevers, chills, nausea, vomiting, or diarrhea. With that being said I think that her leg is doing quite well to be honest. 09/04/2020 upon evaluation today patient appears to be doing well with regard to her wound on the heel. This is showing signs of good epithelial growth although there is probably can it be an indention where this heals that is okay as long as we get it closed. Fortunately I do not see any evidence of infection at this point. 09/13/2020 upon evaluation today patient appears to be doing well with regard to her wounds. She has been tolerating the dressing changes [name], [name] ([id number]) without complication. Fortunately he is actually doing extremely well and I think she is making  great progress this is measuring smaller. I would recommend that such that we continue with the [facility] likely since he is doing so well. 09/18/2020 upon evaluation today patient appears to be doing well with regard to her heel ulcer. She is making good progress and I am very pleased with what we see today. I think the Hydrofera Blue is still doing excellent. 10/02/2020 upon evaluation today patient's wound actually appears to be showing signs of good improvement in regard to the  heel. I am very pleased in this regard. With that being said I do believe that the area which is somewhat been stable and dry is starting to lift up and beginning to clear this away as well that is on the foot. Subsequently I am going to have to perform some debridement here and we did actually go ahead and allow this as a wound today as before it was just more deep tissue injury and eschar covering now I think it is a little bit more than that to be honest. 10/09/2020 upon evaluation today patient appears to be doing decently well in regard to her wounds. I am actually very pleased with where things stand today. There does not appear to be any signs of active infection which is great news. No fevers, chills, nausea, vomiting, or diarrhea. She is going require some sharp debridement today. 10/16/2020 upon evaluation today patient appears to be doing decently well in regard to her heel as well as the foot. Both are showing signs of good improvement which is great news. In general and extremely pleased with where things stand at this point. She is tolerating the [facility] well although this is getting so small in the heel I think collagen may be a better option here based on what I am seeing. With regard to the foot the Iodoflex/Iodosorb is still probably the best method here. 10/26/2020 upon evaluation today patient actually appears to be doing well in some respects today. She has been tolerating the dressing changes without complication and this is great news. The Hydrofera Blue is done well for the heel this actually appears to be healed today. With that being said in regard to the foot I think we are ready to switch to [facility] based on what I see at this point. 10/30/2020 upon evaluation today patient appears to be doing well with regard to his wound. He has been tolerating the dressing changes without complication. Fortunately there does not appear to be any signs of active infection  systemically at this time which is great news and overall very pleased with where things stand today. I do think that she is making progress although it somewhat slow still where showing signs of improvement little by little here. 11/06/2020 upon evaluation today patient appears to be doing decently well in regard to her wound. We will start and see better overall appearance of the base of the wound which is great news she still continues to have some abnormal fluorescence signals with a MolecuLight DX that will be detailed below. 11/20/2020 upon evaluation today patient's wound actually showing signs of improvement. There is good to be some need for sharp debridement today and that was discussed with the patient. I am going to go ahead and clean this up but I do believe the [facility] is doing a great job. 11/27/2020; the patient had eschar over the original heel wound which I removed with a #3 curette there was nothing open here. The area is on  the lateral left foot foot. 12/04/2020 upon evaluation today patient appears to be doing well with regard to her wound. The wound bed actually appeared to be doing well after I remove the dry endoform from the wound bed. This I tracked little bit of fluid but nonetheless underneath this appears to be doing awesome. I am very pleased with where things stand today. No fevers, chills, nausea, vomiting, or diarrhea. 12/11/2020 upon evaluation today patient appears to be doing well with regard to her wound although it keeps getting dry and filled up with the endoform I am not seeing any signs of infection but we are going to have to clean this away in order to try and get things moving in an appropriate direction. I discussed that with patient's daughter today as well. She is in agreement with proceeding as such. 12/21/2020 upon evaluation today patient appears to be doing well with regard to her wound this did not seal off like it was with the collagen  but nonetheless also did not really feeling quite as much as I would like to have seen. I do believe that we may need to go ahead and see about doing a debridement today and I really feel like we may want to switch back to the [facility] which I felt like was doing better in the past. Good news is the base of the wound appears healthy with good granulation tissue. 12/28/2020 upon evaluation patient's wound actually showed signs of doing quite well in regard to her foot. I think that we getting very close to complete resolution and overall I am extremely happy with where things stand today. 01/04/2021 upon evaluation today patient appears to be doing well with regard to her wound on the foot. This is good to require little bit of debridement today but overall seems to be doing quite well. I am actually very pleased with where we stand currently. 01/08/2021 upon evaluation today patient's wound actually showing signs of some improvement although she still has some callus buildup around the edges of the wound unfortunately I think this is continuing to rub despite what we are doing currently. I think that we may need to go ahead and see about switching up the dressing a little bit I Minna recommend collagen and then on top of the collagen we will get a use some foam double layered cut in a doughnut to try to take pressure off of this area we will see how this does over the next week. 01/15/2021 upon evaluation today patient's wound is actually showing signs of being completely dried over with the collagen. This is definitely not what we are looking for. I think that the wound dried out too quickly is the main issue that we are running into at this point and I really think we may need to do something else try to keep that from occurring. 01/22/2021 upon evaluation today patient's wound actually did stay open it did not callus over as its been doing in the past this is good news. Also feel like it may  not be quite as deep as what we have previously seen. There is still not as much improvement he does what I would like to see but nonetheless I think we are headed in the right direction. 01/29/2021 upon evaluation today patient appears to be doing okay in regard to her wound is not too dry but unfortunately is too wet the Xeroform just did not do what I was hoping that she made  things a little bit worse as far as size wise I am actually going to try at this point considering this is more open but not as deep the Hydrofera Blue to see if this will be beneficial at this time. Again we seem to be cycling through things and it is becoming a little difficult to get this to completely close but in the past Hydrofera Blue is what she did the best with 1 week to keep it from closing up prematurely. 02/05/2021 upon evaluation today patient's wound actually showing some signs of improvement which is great news. Fortunately I do not see any evidence of active infection locally nor systemically. It is definitely not too wet which is been the biggest issue we have seen up to this point over the past several weeks. [name], [name] ([id number]) 02/21/2021 upon evaluation today patient appears to be doing excellent in regard to her wound. This actually appears much improved compared to last time I saw her which is great news as well. No fevers, chills, nausea, vomiting, or diarrhea. 02/28/2021 upon evaluation today patient's wound on her foot actually appears to be doing quite well. I am very pleased with where things stand today. I do not see any evidence of active infection locally nor systemically at this time. No fevers, chills, nausea, vomiting, or diarrhea. Electronic Signature(s) Signed: 02/28/2021 1:12:52 PM By: Worthy Keeler PA-C Entered By: Worthy Keeler on 02/28/2021 13:12:52 [name], [name] ([id number]) -------------------------------------------------------------------------------- Physical  Exam Details Patient Name: Coppin, Ladajah L. Date of Service: 02/28/2021 11:30 AM Medical Record Number: [id number] Patient Account Number: [id number] Date of Birth/Sex: 21-Dec-1926 (88 y.o. F) Treating RN: Donnamarie Poag Primary Care Provider: [name] Other Clinician: Referring Provider: [name] Treating Provider/Extender: Skipper Cliche in Treatment: 73 Constitutional Well-nourished and well-hydrated in no acute distress. Respiratory normal breathing without difficulty. Psychiatric this patient is able to make decisions and demonstrates good insight into disease process. Alert and Oriented x 3. pleasant and cooperative. Notes Patient's wound actually showing signs of excellent improvement and I am very pleased with what we see currently. I do not see any evidence of active infection locally nor systemically which is great news. I think that she does not even need any debridement today which is actually very very good news. Electronic Signature(s) Signed: 02/28/2021 1:13:11 PM By: Worthy Keeler PA-C Entered By: Worthy Keeler on 02/28/2021 13:13:11 [name], [name] ([id number]) -------------------------------------------------------------------------------- Physician Orders Details Patient Name: [name], [name] Date of Service: 02/28/2021 11:30 AM Medical Record Number: [id number] Patient Account Number: [id number] Date of Birth/Sex: February 12, 1927 (88 y.o. F) Treating RN: Donnamarie Poag Primary Care Provider: [name] Other Clinician: Referring Provider: [name] Treating Provider/Extender: Skipper Cliche in Treatment: 34 Verbal / Phone Orders: No Diagnosis Coding ICD-10 Coding Code Description [phone] Pressure ulcer of other site, stage 3 I10 Essential (primary) hypertension F01.50 Vascular dementia without behavioral disturbance I25.10 Atherosclerotic heart disease of native coronary artery without angina pectoris Follow-up Appointments o Return  Appointment in 1 week. o Nurse Visit as needed Bathing/ Shower/ Hygiene o May shower with wound dressing protected with water repellent cover or cast protector. o No tub bath. Anesthetic (Use 'Patient Medications' Section for Anesthetic Order Entry) o Lidocaine applied to wound bed Edema Control - Lymphedema / Segmental Compressive Device / Other o Optional: One layer of unna paste to top of compression wrap (to act as an anchor). o Tubigrip single layer applied. - left leg size D o Patient to  wear own compression stockings. Remove compression stockings every night before going to bed and put on every morning when getting up. - right leg o Elevate legs to the level of the heart and pump ankles as often as possible o Elevate leg(s) parallel to the floor when sitting. o DO YOUR BEST to sleep in the bed at night. DO NOT sleep in your recliner. Long hours of sitting in a recliner leads to swelling of the legs and/or potential wounds on your backside. Off-Loading o Open toe surgical shoe - left o Other: - Offloading boots while in bed; Try pillow under sheet to keep it in place. Additional Orders / Instructions o Follow Nutritious Diet and Increase Protein Intake Wound Treatment Wound #3 - Foot Wound Laterality: Left Cleanser: Soap and Water 2 x Per Week/30 Days Discharge Instructions: Gently cleanse wound with antibacterial soap, rinse and pat dry prior to dressing wounds Primary Dressing: Hydrofera Blue Ready Transfer Foam, 2.5x2.5 (in/in) 2 x Per Week/30 Days Discharge Instructions: ONY ON TOP of wound only-Apply Hydrofera Blue Ready to wound bed as directed Secondary Dressing: Zetuvit Plus Silicone Border Dressing 4x4 (in/in) 2 x Per Week/30 Days Secured With: Tubigrip Size D, 3x10 (in/yd) 2 x Per Week/30 Days Electronic Signature(s) Signed: 03/01/2021 9:05:45 AM By: Worthy Keeler PA-C Signed: 03/01/2021 4:02:12 PM By: Donnamarie Poag Entered By: Donnamarie Poag on  02/28/2021 11:48:13 [name], [name] ([id number]) [name], [name] ([id number]) -------------------------------------------------------------------------------- Problem List Details Patient Name: [name], [name] Date of Service: 02/28/2021 11:30 AM Medical Record Number: [id number] Patient Account Number: [id number] Date of Birth/Sex: 1926-10-05 (88 y.o. F) Treating RN: Donnamarie Poag Primary Care Provider: [name] Other Clinician: Referring Provider: [name] Treating Provider/Extender: Skipper Cliche in Treatment: 74 Active Problems ICD-10 Encounter Code Description Active Date MDM Diagnosis [phone] Pressure ulcer of other site, stage 3 10/02/2020 No Yes I10 Essential (primary) hypertension 06/26/2020 No Yes F01.50 Vascular dementia without behavioral disturbance 06/26/2020 No Yes I25.10 Atherosclerotic heart disease of native coronary artery without angina 06/26/2020 No Yes pectoris Inactive Problems Resolved Problems ICD-10 Code Description Active Date Resolved Date L89.623 Pressure ulcer of left heel, stage 3 06/26/2020 06/26/2020 Electronic Signature(s) Signed: 02/28/2021 11:30:09 AM By: Worthy Keeler PA-C Entered By: Worthy Keeler on 02/28/2021 11:30:09 [name], [name] ([id number]) -------------------------------------------------------------------------------- Progress Note Details Patient Name: [name], [name] Date of Service: 02/28/2021 11:30 AM Medical Record Number: [id number] Patient Account Number: [id number] Date of Birth/Sex: 11-Aug-1926 (88 y.o. F) Treating RN: Donnamarie Poag Primary Care Provider: [name] Other Clinician: Referring Provider: [name] Treating Provider/Extender: Skipper Cliche in Treatment: 35 Subjective Chief Complaint Information obtained from Patient Left Heel Pressure Ulcer History of Present Illness (HPI) 88 year old patient was recently seen by the PCPs office for significant pain right great toe which  has been going on since July. She was initially treated with Keflex which she did not complete and after the office visit this time she has been put on doxycycline. at the time of her visit she was found to have a ulcer on the plantar surface of the right great toe and also had a pyogenic granuloma over this area. X-ray of the right foot done 12/29/2014 -- IMPRESSION:Soft-tissue swelling and ulceration right great toe. No underlying bony lytic lesion identified. If osteomyelitis remains of clinical concern MRI can be obtained. Past medical history significant for anemia, chronic kidney disease stage III, obesity, varicose veins, coronary artery disease, gout, history of nicotine addiction given up smoking in 2002, hypertension,  status post cardiac catheterization, status post abdominal hysterectomy, cholecystectomy and tonsillectomy. hemoglobin A1c done in August was 5.8 01/22/2015 -- at this stage the [facility] Walking boat was going to cost them significant amount of money and they want to defer using that at the present time. 01/29/2015 -- she had a podiatry appointment and they have trimmed her toenails. She has not heard back from the vascular office regarding her venous duplex study and I have asked them to call personally so that they can get the appointment soon. 02/06/2015 -- they have made contact with the vascular office and from what I understand that test has been done but the report is pending. 02/19/2014 -- the vascular test is scheduled for tomorrow Readmission: 06/26/2020 upon evaluation today patient appears for initial evaluation in her clinic that she has been here before in 2016 and has been quite sometime. She did have a fractured hip in February on the 23rd 2022. Subsequently this had to be pinned and she ended up with a pressure injury on her heel following when she was using her foot to help move her around in the bed. Subsequently this has led to the wound that she has been  dealing with since that time. She lives at home with her daughter currently she does have dementia. 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 07/03/2020 upon evaluation today patient appears to be doing well with regard to her heel ulcer. She did have arterial studies they appear to be doing excellent it was premature normal across the board with TBI's in the 90s and ABIs well within normal range. Nonetheless there does not appear to be any signs of arterial insufficiency whatsoever. With that being said the patient does have also signs of improvement there is some necrotic tissue in the base of the wound number to try to clear some of that away today I do believe the Iodoflex/Iodosorb is doing well. 5/24; difficult punched out wound on the left medial heel. She has been using Iodoflex 07/17/2020 upon evaluation today patient appears to be doing well at this point in regard to her wound. I do feel like this is a little bit deeper but again that is what is expected as we continue with the Iodoflex I think that this is just going to get deeper until we get to the base of the wound. With that being said I think we are getting much closer to the base of the wound where we can have healthier tissue that we get a be managing here which that will be awesome. In the meantime I am not surprised by what I am seeing and in fact the wound appears to be better as compared to previous findings. 07/24/2020 upon evaluation today patient appears to be doing well with regard to her wound. Overall I am extremely pleased with where things stand today. I do not see any signs of active infection which is great and overall I think that the patient is making good progress. There is good to be some need for sharp debridement today. 07/31/2020 upon evaluation today patient appears to be doing better  in regard to her heel ulcer. She has been tolerating the dressing changes without complication. Fortunately there does not appear to be any signs of active infection which is great and overall very pleased in this regard. No fevers, chills, nausea, vomiting, or diarrhea. 08/14/2020 upon  evaluation today patient appears to be doing better in regard to her heel ulcer. I am very pleased in that regard. Unfortunately she still has a slight deep tissue injury in regard to the medial portion of her foot over the same area. Unfortunately I think this is something that if were not careful it was can open up into the wound. That is my main concern here based on what I see. Fortunately there does not appear to be any evidence of active infection which is great news and overall very pleased with where things stand at this point. 08/21/2020 upon evaluation today patient appears to be doing well with regard to her wound. She has been tolerating the dressing changes without complication. Fortunately there does not appear to be any signs of active infection at this time. No fevers, chills, nausea, vomiting, or diarrhea. I do believe the compression wrap was beneficial for her. 08/28/2020 upon evaluation today patient appears to be doing well with regard to her wounds currently. Fortunately there does not appear to be any signs of active infection at this time. No fevers, chills, nausea, vomiting, or diarrhea. With that being said I think that her leg is doing quite well to be honest. Chalfin, [name] [name] L. ([id number]) 09/04/2020 upon evaluation today patient appears to be doing well with regard to her wound on the heel. This is showing signs of good epithelial growth although there is probably can it be an indention where this heals that is okay as long as we get it closed. Fortunately I do not see any evidence of infection at this point. 09/13/2020 upon evaluation today patient appears to be doing well with regard to her  wounds. She has been tolerating the dressing changes without complication. Fortunately he is actually doing extremely well and I think she is making great progress this is measuring smaller. I would recommend that such that we continue with the [facility] likely since he is doing so well. 09/18/2020 upon evaluation today patient appears to be doing well with regard to her heel ulcer. She is making good progress and I am very pleased with what we see today. I think the Hydrofera Blue is still doing excellent. 10/02/2020 upon evaluation today patient's wound actually appears to be showing signs of good improvement in regard to the heel. I am very pleased in this regard. With that being said I do believe that the area which is somewhat been stable and dry is starting to lift up and beginning to clear this away as well that is on the foot. Subsequently I am going to have to perform some debridement here and we did actually go ahead and allow this as a wound today as before it was just more deep tissue injury and eschar covering now I think it is a little bit more than that to be honest. 10/09/2020 upon evaluation today patient appears to be doing decently well in regard to her wounds. I am actually very pleased with where things stand today. There does not appear to be any signs of active infection which is great news. No fevers, chills, nausea, vomiting, or diarrhea. She is going require some sharp debridement today. 10/16/2020 upon evaluation today patient appears to be doing decently well in regard to her heel as well as the foot. Both are showing signs of good improvement which is great news. In general and extremely pleased with where things stand at this point. She is tolerating the [facility] well although this is getting  so small in the heel I think collagen may be a better option here based on what I am seeing. With regard to the foot the Iodoflex/Iodosorb is still probably the best method  here. 10/26/2020 upon evaluation today patient actually appears to be doing well in some respects today. She has been tolerating the dressing changes without complication and this is great news. The Hydrofera Blue is done well for the heel this actually appears to be healed today. With that being said in regard to the foot I think we are ready to switch to [facility] based on what I see at this point. 10/30/2020 upon evaluation today patient appears to be doing well with regard to his wound. He has been tolerating the dressing changes without complication. Fortunately there does not appear to be any signs of active infection systemically at this time which is great news and overall very pleased with where things stand today. I do think that she is making progress although it somewhat slow still where showing signs of improvement little by little here. 11/06/2020 upon evaluation today patient appears to be doing decently well in regard to her wound. We will start and see better overall appearance of the base of the wound which is great news she still continues to have some abnormal fluorescence signals with a MolecuLight DX that will be detailed below. 11/20/2020 upon evaluation today patient's wound actually showing signs of improvement. There is good to be some need for sharp debridement today and that was discussed with the patient. I am going to go ahead and clean this up but I do believe the [facility] is doing a great job. 11/27/2020; the patient had eschar over the original heel wound which I removed with a #3 curette there was nothing open here. The area is on the lateral left foot foot. 12/04/2020 upon evaluation today patient appears to be doing well with regard to her wound. The wound bed actually appeared to be doing well after I remove the dry endoform from the wound bed. This I tracked little bit of fluid but nonetheless underneath this appears to be doing awesome. I am very pleased  with where things stand today. No fevers, chills, nausea, vomiting, or diarrhea. 12/11/2020 upon evaluation today patient appears to be doing well with regard to her wound although it keeps getting dry and filled up with the endoform I am not seeing any signs of infection but we are going to have to clean this away in order to try and get things moving in an appropriate direction. I discussed that with patient's daughter today as well. She is in agreement with proceeding as such. 12/21/2020 upon evaluation today patient appears to be doing well with regard to her wound this did not seal off like it was with the collagen but nonetheless also did not really feeling quite as much as I would like to have seen. I do believe that we may need to go ahead and see about doing a debridement today and I really feel like we may want to switch back to the [facility] which I felt like was doing better in the past. Good news is the base of the wound appears healthy with good granulation tissue. 12/28/2020 upon evaluation patient's wound actually showed signs of doing quite well in regard to her foot. I think that we getting very close to complete resolution and overall I am extremely happy with where things stand today. 01/04/2021 upon evaluation today patient appears to  be doing well with regard to her wound on the foot. This is good to require little bit of debridement today but overall seems to be doing quite well. I am actually very pleased with where we stand currently. 01/08/2021 upon evaluation today patient's wound actually showing signs of some improvement although she still has some callus buildup around the edges of the wound unfortunately I think this is continuing to rub despite what we are doing currently. I think that we may need to go ahead and see about switching up the dressing a little bit I Minna recommend collagen and then on top of the collagen we will get a use some foam double layered cut  in a doughnut to try to take pressure off of this area we will see how this does over the next week. 01/15/2021 upon evaluation today patient's wound is actually showing signs of being completely dried over with the collagen. This is definitely not what we are looking for. I think that the wound dried out too quickly is the main issue that we are running into at this point and I really think we may need to do something else try to keep that from occurring. 01/22/2021 upon evaluation today patient's wound actually did stay open it did not callus over as its been doing in the past this is good news. Also feel like it may not be quite as deep as what we have previously seen. There is still not as much improvement he does what I would like to see but nonetheless I think we are headed in the right direction. 01/29/2021 upon evaluation today patient appears to be doing okay in regard to her wound is not too dry but unfortunately is too wet the Xeroform just did not do what I was hoping that she made things a little bit worse as far as size wise I am actually going to try at this point considering this is more open but not as deep the Hydrofera Blue to see if this will be beneficial at this time. Again we seem to be cycling through things and it is becoming a little difficult to get this to completely close but in the past Hydrofera Blue is what she did the best with 1 week to keep Ramaswamy, Conleigh L. ([id number]) it from closing up prematurely. 02/05/2021 upon evaluation today patient's wound actually showing some signs of improvement which is great news. Fortunately I do not see any evidence of active infection locally nor systemically. It is definitely not too wet which is been the biggest issue we have seen up to this point over the past several weeks. 02/21/2021 upon evaluation today patient appears to be doing excellent in regard to her wound. This actually appears much improved compared to last time I  saw her which is great news as well. No fevers, chills, nausea, vomiting, or diarrhea. 02/28/2021 upon evaluation today patient's wound on her foot actually appears to be doing quite well. I am very pleased with where things stand today. I do not see any evidence of active infection locally nor systemically at this time. No fevers, chills, nausea, vomiting, or diarrhea. Objective Constitutional Well-nourished and well-hydrated in no acute distress. Vitals Time Taken: 11:35 AM, Height: 58 in, Weight: 137 lbs, BMI: 28.6, Temperature: 98.4 F, Pulse: 92 bpm, Respiratory Rate: 16 breaths/min, Blood Pressure: 128/81 mmHg. Respiratory normal breathing without difficulty. Psychiatric this patient is able to make decisions and demonstrates good insight into disease process. Alert and Oriented x  3. pleasant and cooperative. General Notes: Patient's wound actually showing signs of excellent improvement and I am very pleased with what we see currently. I do not see any evidence of active infection locally nor systemically which is great news. I think that she does not even need any debridement today which is actually very very good news. Integumentary (Hair, Skin) Wound #3 status is Open. Original cause of wound was Pressure Injury. The date acquired was: 10/02/2020. The wound has been in treatment 21 weeks. The wound is located on the Left Foot. The wound measures 0.2cm length x 0.2cm width x 0.3cm depth; 0.031cm^2 area and 0.009cm^3 volume. There is Fat Layer (Subcutaneous Tissue) exposed. There is no tunneling or undermining noted. There is a medium amount of serous drainage noted. The wound margin is thickened. There is large (67-100%) red, pink granulation within the wound bed. There is a small (1-33%) amount of necrotic tissue within the wound bed including Adherent Slough. Assessment Active Problems ICD-10 Pressure ulcer of other site, stage 3 Essential (primary) hypertension Vascular dementia  without behavioral disturbance Atherosclerotic heart disease of native coronary artery without angina pectoris Plan Follow-up Appointments: Return Appointment in 1 week. Nurse Visit as needed Bathing/ Shower/ Hygiene: May shower with wound dressing protected with water repellent cover or cast protector. No tub bath. Anesthetic (Use 'Patient Medications' Section for Anesthetic Order Entry): Lidocaine applied to wound bed Sherrin, Sharie L. ([id number]) Edema Control - Lymphedema / Segmental Compressive Device / Other: Optional: One layer of unna paste to top of compression wrap (to act as an anchor). Tubigrip single layer applied. - left leg size D Patient to wear own compression stockings. Remove compression stockings every night before going to bed and put on every morning when getting up. - right leg Elevate legs to the level of the heart and pump ankles as often as possible Elevate leg(s) parallel to the floor when sitting. DO YOUR BEST to sleep in the bed at night. DO NOT sleep in your recliner. Long hours of sitting in a recliner leads to swelling of the legs and/or potential wounds on your backside. Off-Loading: Open toe surgical shoe - left Other: - Offloading boots while in bed; Try pillow under sheet to keep it in place. Additional Orders / Instructions: Follow Nutritious Diet and Increase Protein Intake WOUND #3: - Foot Wound Laterality: Left Cleanser: Soap and Water 2 x Per Week/30 Days Discharge Instructions: Gently cleanse wound with antibacterial soap, rinse and pat dry prior to dressing wounds Primary Dressing: Hydrofera Blue Ready Transfer Foam, 2.5x2.5 (in/in) 2 x Per Week/30 Days Discharge Instructions: ONY ON TOP of wound only-Apply Hydrofera Blue Ready to wound bed as directed Secondary Dressing: Zetuvit Plus Silicone Border Dressing 4x4 (in/in) 2 x Per Week/30 Days Secured With: Tubigrip Size D, 3x10 (in/yd) 2 x Per Week/30 Days 1. I would recommend currently  that we go ahead and initiate a continuation of therapy with the [facility] which I think is doing a good job. 2. I am also can recommend that we continue with the Zetuvit border foam dressing to cover. 3. We will also continue with the Tubigrip size D. We will see patient back for reevaluation in 1 week here in the clinic. If anything worsens or changes patient will contact our office for additional recommendations. Electronic Signature(s) Signed: 02/28/2021 1:13:31 PM By: Worthy Keeler PA-C Entered By: Worthy Keeler on 02/28/2021 13:13:30 Funches, Sheyna L. ([id number]) -------------------------------------------------------------------------------- SuperBill Details Patient Name: Kundinger, Shanta L. Date of  Service: 02/28/2021 Medical Record Number: [id number] Patient Account Number: [id number] Date of Birth/Sex: 11/18/1926 (88 y.o. F) Treating RN: Donnamarie Poag Primary Care Provider: [name] Other Clinician: Referring Provider: [name] Treating Provider/Extender: Skipper Cliche in Treatment: 35 Diagnosis Coding ICD-10 Codes Code Description [phone] Pressure ulcer of other site, stage 3 I10 Essential (primary) hypertension I25.10 Atherosclerotic heart disease of native coronary artery without angina pectoris Vascular dementia, unspecified severity, without behavioral disturbance, psychotic disturbance, mood disturbance, and F01.50 anxiety Facility Procedures CPT4 Code: [id number] Description: [phone] - WOUND CARE VISIT-LEV 2 EST PT Modifier: Quantity: 1 Physician Procedures CPT4: Description Modifier Quantity Code [id number] - WC PHYS LEVEL 3 - EST PT 1 CPT4: ICD-10 Diagnosis Description L89.893 Pressure ulcer of other site, stage 3 I10 Essential (primary) hypertension I25.10 Atherosclerotic heart disease of native coronary artery without angina pectoris F01.50 Vascular dementia, unspecified severity,  without behavioral disturbance, psychotic disturbance, mood  disturbance, and anxiety Electronic Signature(s) Signed: 02/28/2021 1:13:53 PM By: Worthy Keeler PA-C Entered By: Worthy Keeler on 02/28/2021 13:13:52

## 2021-03-07 ENCOUNTER — Other Ambulatory Visit: Payer: Self-pay

## 2021-03-07 DIAGNOSIS — L89893 Pressure ulcer of other site, stage 3: Secondary | ICD-10-CM | POA: Diagnosis not present

## 2021-03-07 NOTE — Progress Notes (Signed)
[name], [name] ([id number]) Visit Report for 03/07/2021 Arrival Information Details Patient Name: [name] [name], [name] Date of Service: 03/07/2021 11:30 AM Medical Record Number: [id number] Patient Account Number: [id number] Date of Birth/Sex: 1926-09-10 (88 y.o. F) Treating RN: Donnamarie Poag Primary Care Rowyn Spilde: [name] Other Clinician: Referring Dmarcus Decicco: [name] Treating Mithran Strike/Extender: Skipper Cliche in Treatment: 97 Visit Information History Since Last Visit Added or deleted any medications: No Patient Arrived: Wheel Chair Had a fall or experienced change in No Arrival Time: 11:23 activities of daily living that may affect Accompanied By: daughter risk of falls: Transfer Assistance: EasyPivot Patient Lift Hospitalized since last visit: No Patient Identification Verified: Yes Has Dressing in Place as Prescribed: Yes Secondary Verification Process Completed: Yes Pain Present Now: No Patient Requires Transmission-Based No Precautions: Patient Has Alerts: Yes Patient Alerts: NOT diabetic ABI R1.18 L1.25 06/27/20 Electronic Signature(s) Signed: 03/07/2021 2:32:27 PM By: Donnamarie Poag Entered ByDonnamarie Poag on 03/07/2021 11:27:26 [name], [name] ([id number]) -------------------------------------------------------------------------------- Clinic Level of Care Assessment Details Patient Name: [name], [name] Date of Service: 03/07/2021 11:30 AM Medical Record Number: [id number] Patient Account Number: [id number] Date of Birth/Sex: February 07, 1927 (88 y.o. F) Treating RN: Donnamarie Poag Primary Care Boykin Baetz: [name] Other Clinician: Referring Jamelle Noy: [name] Treating Semaya Vida/Extender: Skipper Cliche in Treatment: 36 Clinic Level of Care Assessment Items TOOL 4 Quantity Score []  - Use when only an EandM is performed on FOLLOW-UP visit 0 ASSESSMENTS - Nursing Assessment / Reassessment []  - Reassessment of Co-morbidities (includes updates in  patient status) 0 []  - 0 Reassessment of Adherence to Treatment Plan ASSESSMENTS - Wound and Skin Assessment / Reassessment X - Simple Wound Assessment / Reassessment - one wound 1 5 []  - 0 Complex Wound Assessment / Reassessment - multiple wounds []  - 0 Dermatologic / Skin Assessment (not related to wound area) ASSESSMENTS - Focused Assessment []  - Circumferential Edema Measurements - multi extremities 0 []  - 0 Nutritional Assessment / Counseling / Intervention []  - 0 Lower Extremity Assessment (monofilament, tuning fork, pulses) []  - 0 Peripheral Arterial Disease Assessment (using hand held doppler) ASSESSMENTS - Ostomy and/or Continence Assessment and Care []  - Incontinence Assessment and Management 0 []  - 0 Ostomy Care Assessment and Management (repouching, etc.) PROCESS - Coordination of Care X - Simple Patient / Family Education for ongoing care 1 15 []  - 0 Complex (extensive) Patient / Family Education for ongoing care []  - 0 Staff obtains Programmer, systems, Records, Test Results / Process Orders []  - 0 Staff telephones HHA, Nursing Homes / Clarify orders / etc []  - 0 Routine Transfer to another Facility (non-emergent condition) []  - 0 Routine Hospital Admission (non-emergent condition) []  - 0 New Admissions / Biomedical engineer / Ordering NPWT, Apligraf, etc. []  - 0 Emergency Hospital Admission (emergent condition) X- 1 10 Simple Discharge Coordination []  - 0 Complex (extensive) Discharge Coordination PROCESS - Special Needs []  - Pediatric / Minor Patient Management 0 []  - 0 Isolation Patient Management []  - 0 Hearing / Language / Visual special needs []  - 0 Assessment of Community assistance (transportation, D/C planning, etc.) []  - 0 Additional assistance / Altered mentation []  - 0 Support Surface(s) Assessment (bed, cushion, seat, etc.) INTERVENTIONS - Wound Cleansing / Measurement [name], [name] ([id number]) X- 1 5 Simple Wound Cleansing - one  wound []  - 0 Complex Wound Cleansing - multiple wounds []  - 0 Wound Imaging (photographs - any number of wounds) []  - 0 Wound Tracing (instead of photographs) X- 1 5 Simple Wound  Measurement - one wound []  - 0 Complex Wound Measurement - multiple wounds INTERVENTIONS - Wound Dressings X - Small Wound Dressing one or multiple wounds 1 10 []  - 0 Medium Wound Dressing one or multiple wounds []  - 0 Large Wound Dressing one or multiple wounds []  - 0 Application of Medications - topical []  - 0 Application of Medications - injection INTERVENTIONS - Miscellaneous []  - External ear exam 0 []  - 0 Specimen Collection (cultures, biopsies, blood, body fluids, etc.) []  - 0 Specimen(s) / Culture(s) sent or taken to Lab for analysis []  - 0 Patient Transfer (multiple staff / Civil Service fast streamer / Similar devices) []  - 0 Simple Staple / Suture removal (25 or less) []  - 0 Complex Staple / Suture removal (26 or more) []  - 0 Hypo / Hyperglycemic Management (close monitor of Blood Glucose) []  - 0 Ankle / Brachial Index (ABI) - do not check if billed separately []  - 0 Vital Signs Has the patient been seen at the hospital within the last three years: Yes Total Score: 50 Level Of Care: New/Established - Level 2 Electronic Signature(s) Signed: 03/07/2021 2:32:27 PM By: Donnamarie Poag Entered ByDonnamarie Poag on 03/07/2021 11:53:24 [name], [name] ([id number]) -------------------------------------------------------------------------------- Encounter Discharge Information Details Patient Name: [name], [name] Date of Service: 03/07/2021 11:30 AM Medical Record Number: [id number] Patient Account Number: [id number] Date of Birth/Sex: [birth year] (88 y.o. F) Treating RN: Donnamarie Poag Primary Care Jasmine Mcbeth: [name] Other Clinician: Referring Catlynn Grondahl: [name] Treating Deniss Wormley/Extender: Skipper Cliche in Treatment: 36 Encounter Discharge Information Items Discharge Condition:  Stable Ambulatory Status: Wheelchair Discharge Destination: Home Transportation: Private Auto Accompanied By: daughter Schedule Follow-up Appointment: Yes Clinical Summary of Care: Electronic Signature(s) Signed: 03/07/2021 11:52:59 AM By: Donnamarie Poag Entered By: Donnamarie Poag on 03/07/2021 11:52:58 [name], [name] ([id number]) -------------------------------------------------------------------------------- Wound Assessment Details Patient Name: [name], [name] Date of Service: 03/07/2021 11:30 AM Medical Record Number: [id number] Patient Account Number: [id number] Date of Birth/Sex: 06/15/1926 (88 y.o. F) Treating RN: Donnamarie Poag Primary Care Hal Norrington: [name] Other Clinician: Referring Danie Diehl: [name] Treating Arvind Mexicano/Extender: Skipper Cliche in Treatment: 36 Wound Status Wound Number: 3 Primary Pressure Ulcer Etiology: Wound Location: Left Foot Wound Open Wounding Event: Pressure Injury Status: Date Acquired: 10/02/2020 Comorbid Cataracts, Anemia, Coronary Artery Disease, Weeks Of Treatment: 22 History: Hypertension, Myocardial Infarction, End Stage Renal Clustered Wound: No Disease, Gout, Osteoarthritis, Dementia Wound Measurements Length: (cm) 0.2 Width: (cm) 0.2 Depth: (cm) 0.3 Area: (cm) 0.031 Volume: (cm) 0.009 % Reduction in Area: 90.1% % Reduction in Volume: 71% Epithelialization: None Wound Description Classification: Category/Stage III Wound Margin: Thickened Exudate Amount: Medium Exudate Type: Serous Exudate Color: amber Foul Odor After Cleansing: No Slough/Fibrino Yes Wound Bed Granulation Amount: Large (67-100%) Exposed Structure Granulation Quality: Red, Pink Fascia Exposed: No Necrotic Amount: Small (1-33%) Fat Layer (Subcutaneous Tissue) Exposed: Yes Necrotic Quality: Adherent Slough Tendon Exposed: No Muscle Exposed: No Joint Exposed: No Bone Exposed: No Treatment Notes Wound #3 (Foot) Wound Laterality:  Left Cleanser Soap and Water Discharge Instruction: Gently cleanse wound with antibacterial soap, rinse and pat dry prior to dressing wounds Peri-Wound Care Topical Primary Dressing Hydrofera Blue Ready Transfer Foam, 2.5x2.5 (in/in) Discharge Instruction: ONY ON TOP of wound only-Apply Hydrofera Blue Ready to wound bed as directed Secondary Dressing Zetuvit Plus Silicone Border Dressing 4x4 (in/in) Secured With Tubigrip Size D, 3x10 (in/yd) Compression Wrap Compression Stockings [name], [name] ([id number]) Add-Ons Electronic Signature(s) Signed: 03/07/2021 2:32:27 PM By: Donnamarie Poag Entered By: Donnamarie Poag on  03/07/2021 11:30:14 °

## 2021-03-07 NOTE — Progress Notes (Signed)
Regis, Hareem L. ([id number]) Visit Report for 03/07/2021 Physician Orders Details Patient Name: Schneller, [name] Date of Service: 03/07/2021 11:30 AM Medical Record Number: [id number] Patient Account Number: [id number] Date of Birth/Sex: 04-23-1926 (88 y.o. F) Treating RN: Donnamarie Poag Primary Care Provider: [name] Other Clinician: Referring Provider: [name] Treating Provider/Extender: Skipper Cliche in Treatment: 101 Verbal / Phone Orders: No Diagnosis Coding Follow-up Appointments o Return Appointment in 1 week. o Nurse Visit as needed Bathing/ Shower/ Hygiene o May shower with wound dressing protected with water repellent cover or cast protector. o No tub bath. Anesthetic (Use 'Patient Medications' Section for Anesthetic Order Entry) o Lidocaine applied to wound bed Edema Control - Lymphedema / Segmental Compressive Device / Other o Optional: One layer of unna paste to top of compression wrap (to act as an anchor). o Tubigrip single layer applied. - left leg size D o Patient to wear own compression stockings. Remove compression stockings every night before going to bed and put on every morning when getting up. - right leg o Elevate legs to the level of the heart and pump ankles as often as possible o Elevate leg(s) parallel to the floor when sitting. o DO YOUR BEST to sleep in the bed at night. DO NOT sleep in your recliner. Long hours of sitting in a recliner leads to swelling of the legs and/or potential wounds on your backside. Off-Loading o Open toe surgical shoe - left o Other: - Offloading boots while in bed; Try pillow under sheet to keep it in place. Additional Orders / Instructions o Follow Nutritious Diet and Increase Protein Intake Wound Treatment Wound #3 - Foot Wound Laterality: Left Cleanser: Soap and Water 2 x Per Week/30 Days Discharge Instructions: Gently cleanse wound with antibacterial soap, rinse and pat dry  prior to dressing wounds Primary Dressing: Hydrofera Blue Ready Transfer Foam, 2.5x2.5 (in/in) 2 x Per Week/30 Days Discharge Instructions: ONY ON TOP of wound only-Apply Hydrofera Blue Ready to wound bed as directed Secondary Dressing: Zetuvit Plus Silicone Border Dressing 4x4 (in/in) 2 x Per Week/30 Days Secured With: Tubigrip Size D, 3x10 (in/yd) 2 x Per Week/30 Days Electronic Signature(s) Signed: 03/07/2021 2:08:42 PM By: Worthy Keeler PA-C Signed: 03/07/2021 2:32:27 PM By: Donnamarie Poag Entered By: Donnamarie Poag on 03/07/2021 11:40:44 Paiz, Ruta L. ([id number]) -------------------------------------------------------------------------------- SuperBill Details Patient Name: Delapena, [name] Date of Service: 03/07/2021 Medical Record Number: [id number] Patient Account Number: [id number] Date of Birth/Sex: [birth year] (88 y.o. F) Treating RN: Donnamarie Poag Primary Care Provider: [name] Other Clinician: Referring Provider: [name] Treating Provider/Extender: Skipper Cliche in Treatment: 36 Diagnosis Coding ICD-10 Codes Code Description [phone] Pressure ulcer of other site, stage 3 I10 Essential (primary) hypertension I25.10 Atherosclerotic heart disease of native coronary artery without angina pectoris Vascular dementia, unspecified severity, without behavioral disturbance, psychotic disturbance, mood disturbance, and F01.50 anxiety Facility Procedures CPT4 Code: [id number] Description: [phone] - WOUND CARE VISIT-LEV 2 EST PT Modifier: Quantity: 1 Electronic Signature(s) Signed: 03/07/2021 11:53:30 AM By: Donnamarie Poag Signed: 03/07/2021 2:08:42 PM By: Worthy Keeler PA-C Entered By: Donnamarie Poag on 03/07/2021 11:53:30

## 2021-03-11 ENCOUNTER — Encounter: Payer: Self-pay | Admitting: Family Medicine

## 2021-03-14 ENCOUNTER — Other Ambulatory Visit: Payer: Self-pay

## 2021-03-14 ENCOUNTER — Encounter: Payer: PPO | Admitting: Physician Assistant

## 2021-03-14 DIAGNOSIS — L89893 Pressure ulcer of other site, stage 3: Secondary | ICD-10-CM | POA: Diagnosis not present

## 2021-03-14 NOTE — Progress Notes (Addendum)
Hammad, Lynsi L. ([id number]) Visit Report for 03/14/2021 Chief Complaint Document Details Patient Name: [name], [name] Date of Service: 03/14/2021 11:30 AM Medical Record Number: [id number] Patient Account Number: [id number] Date of Birth/Sex: 12-24-1926 (88 y.o. F) Treating RN: Donnamarie Poag Primary Care Provider: [name] Other Clinician: Referring Provider: [name] Treating Provider/Extender: Skipper Cliche in Treatment: 70 Information Obtained from: Patient Chief Complaint Left Heel Pressure Ulcer Electronic Signature(s) Signed: 03/14/2021 11:52:56 AM By: Worthy Keeler PA-C Entered By: Worthy Keeler on 03/14/2021 11:52:55 [name], [name] ([id number]) -------------------------------------------------------------------------------- Debridement Details Patient Name: [name], [name] Date of Service: 03/14/2021 11:30 AM Medical Record Number: [id number] Patient Account Number: [id number] Date of Birth/Sex: [birth year] (88 y.o. F) Treating RN: Donnamarie Poag Primary Care Provider: [name] Other Clinician: Referring Provider: [name] Treating Provider/Extender: Skipper Cliche in Treatment: 37 Debridement Performed for Wound #3 Left Foot Assessment: Performed By: Physician [name] Sams., PA-C Debridement Type: Debridement Level of Consciousness (Pre- Awake and Alert procedure): Pre-procedure Verification/Time Out Yes - 11:54 Taken: Start Time: 11:55 Pain Control: Lidocaine Total Area Debrided (L x W): 0.2 (cm) x 0.2 (cm) = 0.04 (cm) Tissue and other material Viable, Non-Viable, Slough, Subcutaneous, Slough debrided: Level: Skin/Subcutaneous Tissue Debridement Description: Excisional Instrument: Curette Bleeding: Minimum Hemostasis Achieved: Pressure Response to Treatment: Procedure was tolerated well Level of Consciousness (Post- Awake and Alert procedure): Post Debridement Measurements of Total Wound Length: (cm) 0.3 Stage:  Category/Stage III Width: (cm) 0.2 Depth: (cm) 0.3 Volume: (cm) 0.014 Character of Wound/Ulcer Post Debridement: Improved Post Procedure Diagnosis Same as Pre-procedure Electronic Signature(s) Signed: 03/14/2021 4:40:19 PM By: Donnamarie Poag Signed: 03/14/2021 6:09:14 PM By: Worthy Keeler PA-C Entered By: Donnamarie Poag on 03/14/2021 11:55:36 [name], [name] ([id number]) -------------------------------------------------------------------------------- HPI Details Patient Name: [name], [name] Date of Service: 03/14/2021 11:30 AM Medical Record Number: [id number] Patient Account Number: [id number] Date of Birth/Sex: 02/15/1927 (88 y.o. F) Treating RN: Donnamarie Poag Primary Care Provider: [name] Other Clinician: Referring Provider: [name] Treating Provider/Extender: Skipper Cliche in Treatment: 34 History of Present Illness HPI Description: 88 year old patient was recently seen by the PCPs office for significant pain right great toe which has been going on since July. She was initially treated with Keflex which she did not complete and after the office visit this time she has been put on doxycycline. at the time of her visit she was found to have a ulcer on the plantar surface of the right great toe and also had a pyogenic granuloma over this area. X-ray of the right foot done 12/29/2014 -- IMPRESSION:Soft-tissue swelling and ulceration right great toe. No underlying bony lytic lesion identified. If osteomyelitis remains of clinical concern MRI can be obtained. Past medical history significant for anemia, chronic kidney disease stage III, obesity, varicose veins, coronary artery disease, gout, history of nicotine addiction given up smoking in 2002, hypertension, status post cardiac catheterization, status post abdominal hysterectomy, cholecystectomy and tonsillectomy. hemoglobin A1c done in August was 5.8 01/22/2015 -- at this stage the [facility] Walking boat was going to cost  them significant amount of money and they want to defer using that at the present time. 01/29/2015 -- she had a podiatry appointment and they have trimmed her toenails. She has not heard back from the vascular office regarding her venous duplex study and I have asked them to call personally so that they can get the appointment soon. 02/06/2015 -- they have made contact with the vascular office and from what I understand  that test has been done but the report is pending. 02/19/2014 -- the vascular test is scheduled for tomorrow Readmission: 06/26/2020 upon evaluation today patient appears for initial evaluation in her clinic that she has been here before in 2016 and has been quite sometime. She did have a fractured hip in February on the 23rd 2022. Subsequently this had to be pinned and she ended up with a pressure injury on her heel following when she was using her foot to help move her around in the bed. Subsequently this has led to the wound that she has been dealing with since that time. She lives at home with her daughter currently she does have dementia. 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 07/03/2020 upon evaluation today patient appears to be doing well with regard to her heel ulcer. She did have arterial studies they appear to be doing excellent it was premature normal across the board with TBI's in the 90s and ABIs well within normal range. Nonetheless there does not appear to be any signs of arterial insufficiency whatsoever. With that being said the patient does have also signs of improvement there is some necrotic tissue in the base of the wound number to try to clear some of that away today I do believe the Iodoflex/Iodosorb is doing well. 5/24; difficult punched out wound on the left medial heel. She has been using Iodoflex 07/17/2020 upon evaluation  today patient appears to be doing well at this point in regard to her wound. I do feel like this is a little bit deeper but again that is what is expected as we continue with the Iodoflex I think that this is just going to get deeper until we get to the base of the wound. With that being said I think we are getting much closer to the base of the wound where we can have healthier tissue that we get a be managing here which that will be awesome. In the meantime I am not surprised by what I am seeing and in fact the wound appears to be better as compared to previous findings. 07/24/2020 upon evaluation today patient appears to be doing well with regard to her wound. Overall I am extremely pleased with where things stand today. I do not see any signs of active infection which is great and overall I think that the patient is making good progress. There is good to be some need for sharp debridement today. 07/31/2020 upon evaluation today patient appears to be doing better in regard to her heel ulcer. She has been tolerating the dressing changes without complication. Fortunately there does not appear to be any signs of active infection which is great and overall very pleased in this regard. No fevers, chills, nausea, vomiting, or diarrhea. 08/14/2020 upon evaluation today patient appears to be doing better in regard to her heel ulcer. I am very pleased in that regard. Unfortunately she still has a slight deep tissue injury in regard to the medial portion of her foot over the same area. Unfortunately I think this is something that if were not careful it was can open up into the wound. That is my main concern here based on what I see. Fortunately there does not appear to be any evidence of active infection which is great news and overall very pleased with where things stand at this point. 08/21/2020 upon evaluation today patient appears  to be doing well with regard to her wound. She has been tolerating the dressing  changes without complication. Fortunately there does not appear to be any signs of active infection at this time. No fevers, chills, nausea, vomiting, or diarrhea. I do believe the compression wrap was beneficial for her. 08/28/2020 upon evaluation today patient appears to be doing well with regard to her wounds currently. Fortunately there does not appear to be any signs of active infection at this time. No fevers, chills, nausea, vomiting, or diarrhea. With that being said I think that her leg is doing quite well to be honest. 09/04/2020 upon evaluation today patient appears to be doing well with regard to her wound on the heel. This is showing signs of good epithelial growth although there is probably can it be an indention where this heals that is okay as long as we get it closed. Fortunately I do not see any evidence of infection at this point. 09/13/2020 upon evaluation today patient appears to be doing well with regard to her wounds. She has been tolerating the dressing changes Suniga, Ksenia L. ([id number]) without complication. Fortunately he is actually doing extremely well and I think she is making great progress this is measuring smaller. I would recommend that such that we continue with the [facility] likely since he is doing so well. 09/18/2020 upon evaluation today patient appears to be doing well with regard to her heel ulcer. She is making good progress and I am very pleased with what we see today. I think the Hydrofera Blue is still doing excellent. 10/02/2020 upon evaluation today patient's wound actually appears to be showing signs of good improvement in regard to the heel. I am very pleased in this regard. With that being said I do believe that the area which is somewhat been stable and dry is starting to lift up and beginning to clear this away as well that is on the foot. Subsequently I am going to have to perform some debridement here and we did actually go ahead and allow  this as a wound today as before it was just more deep tissue injury and eschar covering now I think it is a little bit more than that to be honest. 10/09/2020 upon evaluation today patient appears to be doing decently well in regard to her wounds. I am actually very pleased with where things stand today. There does not appear to be any signs of active infection which is great news. No fevers, chills, nausea, vomiting, or diarrhea. She is going require some sharp debridement today. 10/16/2020 upon evaluation today patient appears to be doing decently well in regard to her heel as well as the foot. Both are showing signs of good improvement which is great news. In general and extremely pleased with where things stand at this point. She is tolerating the [facility] well although this is getting so small in the heel I think collagen may be a better option here based on what I am seeing. With regard to the foot the Iodoflex/Iodosorb is still probably the best method here. 10/26/2020 upon evaluation today patient actually appears to be doing well in some respects today. She has been tolerating the dressing changes without complication and this is great news. The Hydrofera Blue is done well for the heel this actually appears to be healed today. With that being said in regard to the foot I think we are ready to switch to [facility] based on what I see at this  point. 10/30/2020 upon evaluation today patient appears to be doing well with regard to his wound. He has been tolerating the dressing changes without complication. Fortunately there does not appear to be any signs of active infection systemically at this time which is great news and overall very pleased with where things stand today. I do think that she is making progress although it somewhat slow still where showing signs of improvement little by little here. 11/06/2020 upon evaluation today patient appears to be doing decently well in regard to her  wound. We will start and see better overall appearance of the base of the wound which is great news she still continues to have some abnormal fluorescence signals with a MolecuLight DX that will be detailed below. 11/20/2020 upon evaluation today patient's wound actually showing signs of improvement. There is good to be some need for sharp debridement today and that was discussed with the patient. I am going to go ahead and clean this up but I do believe the [facility] is doing a great job. 11/27/2020; the patient had eschar over the original heel wound which I removed with a #3 curette there was nothing open here. The area is on the lateral left foot foot. 12/04/2020 upon evaluation today patient appears to be doing well with regard to her wound. The wound bed actually appeared to be doing well after I remove the dry endoform from the wound bed. This I tracked little bit of fluid but nonetheless underneath this appears to be doing awesome. I am very pleased with where things stand today. No fevers, chills, nausea, vomiting, or diarrhea. 12/11/2020 upon evaluation today patient appears to be doing well with regard to her wound although it keeps getting dry and filled up with the endoform I am not seeing any signs of infection but we are going to have to clean this away in order to try and get things moving in an appropriate direction. I discussed that with patient's daughter today as well. She is in agreement with proceeding as such. 12/21/2020 upon evaluation today patient appears to be doing well with regard to her wound this did not seal off like it was with the collagen but nonetheless also did not really feeling quite as much as I would like to have seen. I do believe that we may need to go ahead and see about doing a debridement today and I really feel like we may want to switch back to the [facility] which I felt like was doing better in the past. Good news is the base of the wound  appears healthy with good granulation tissue. 12/28/2020 upon evaluation patient's wound actually showed signs of doing quite well in regard to her foot. I think that we getting very close to complete resolution and overall I am extremely happy with where things stand today. 01/04/2021 upon evaluation today patient appears to be doing well with regard to her wound on the foot. This is good to require little bit of debridement today but overall seems to be doing quite well. I am actually very pleased with where we stand currently. 01/08/2021 upon evaluation today patient's wound actually showing signs of some improvement although she still has some callus buildup around the edges of the wound unfortunately I think this is continuing to rub despite what we are doing currently. I think that we may need to go ahead and see about switching up the dressing a little bit I Minna recommend collagen and then on top  of the collagen we will get a use some foam double layered cut in a doughnut to try to take pressure off of this area we will see how this does over the next week. 01/15/2021 upon evaluation today patient's wound is actually showing signs of being completely dried over with the collagen. This is definitely not what we are looking for. I think that the wound dried out too quickly is the main issue that we are running into at this point and I really think we may need to do something else try to keep that from occurring. 01/22/2021 upon evaluation today patient's wound actually did stay open it did not callus over as its been doing in the past this is good news. Also feel like it may not be quite as deep as what we have previously seen. There is still not as much improvement he does what I would like to see but nonetheless I think we are headed in the right direction. 01/29/2021 upon evaluation today patient appears to be doing okay in regard to her wound is not too dry but unfortunately is too wet  the Xeroform just did not do what I was hoping that she made things a little bit worse as far as size wise I am actually going to try at this point considering this is more open but not as deep the Hydrofera Blue to see if this will be beneficial at this time. Again we seem to be cycling through things and it is becoming a little difficult to get this to completely close but in the past Hydrofera Blue is what she did the best with 1 week to keep it from closing up prematurely. 02/05/2021 upon evaluation today patient's wound actually showing some signs of improvement which is great news. Fortunately I do not see any evidence of active infection locally nor systemically. It is definitely not too wet which is been the biggest issue we have seen up to this point over the past several weeks. [name], [name] ([id number]) 02/21/2021 upon evaluation today patient appears to be doing excellent in regard to her wound. This actually appears much improved compared to last time I saw her which is great news as well. No fevers, chills, nausea, vomiting, or diarrhea. 02/28/2021 upon evaluation today patient's wound on her foot actually appears to be doing quite well. I am very pleased with where things stand today. I do not see any evidence of active infection locally nor systemically at this time. No fevers, chills, nausea, vomiting, or diarrhea. 03/14/2021 upon evaluation today patient appears to be doing well with regard to her wound. There is a little bit of a limp on the plantar edge of the wound opening which I Minna see about trying to clear away today. Fortunately I think other than this everything seems to be showing signs of improvement measurements still are roughly the same. It is definitely not as deep however. Electronic Signature(s) Signed: 03/14/2021 11:58:20 AM By: Worthy Keeler PA-C Entered By: Worthy Keeler on 03/14/2021 11:58:20 [name], [name]  ([id number]) -------------------------------------------------------------------------------- Physical Exam Details Patient Name: [name], [name] Date of Service: 03/14/2021 11:30 AM Medical Record Number: [id number] Patient Account Number: [id number] Date of Birth/Sex: Mar 06, 1926 (88 y.o. F) Treating RN: Donnamarie Poag Primary Care Provider: [name] Other Clinician: Referring Provider: [name] Treating Provider/Extender: Skipper Cliche in Treatment: 80 Constitutional Well-nourished and well-hydrated in no acute distress. Respiratory normal breathing without difficulty. Psychiatric this patient is able to make decisions  and demonstrates good insight into disease process. Alert and Oriented x 3. pleasant and cooperative. Notes Upon inspection patient's wound bed showed signs of good granulation and epithelization at this point. Fortunately I do not see any evidence of active infection locally nor systemically at this time and overall I think that we are headed in the right direction. Electronic Signature(s) Signed: 03/14/2021 11:58:35 AM By: Worthy Keeler PA-C Entered By: Worthy Keeler on 03/14/2021 11:58:35 [name], [name] ([id number]) -------------------------------------------------------------------------------- Physician Orders Details Patient Name: [name], [name] Date of Service: 03/14/2021 11:30 AM Medical Record Number: [id number] Patient Account Number: [id number] Date of Birth/Sex: 07-19-1926 (88 y.o. F) Treating RN: Donnamarie Poag Primary Care Provider: [name] Other Clinician: Referring Provider: [name] Treating Provider/Extender: Skipper Cliche in Treatment: [phone] Verbal / Phone Orders: No Diagnosis Coding ICD-10 Coding Code Description [phone] Pressure ulcer of other site, stage 3 I10 Essential (primary) hypertension F01.50 Vascular dementia without behavioral disturbance I25.10 Atherosclerotic heart disease of native coronary  artery without angina pectoris Follow-up Appointments o Return Appointment in 1 week. o Nurse Visit as needed Bathing/ Shower/ Hygiene o May shower with wound dressing protected with water repellent cover or cast protector. o No tub bath. Anesthetic (Use 'Patient Medications' Section for Anesthetic Order Entry) o Lidocaine applied to wound bed Edema Control - Lymphedema / Segmental Compressive Device / Other o Optional: One layer of unna paste to top of compression wrap (to act as an anchor). o Tubigrip single layer applied. - left leg size D o Patient to wear own compression stockings. Remove compression stockings every night before going to bed and put on every morning when getting up. - right leg o Elevate legs to the level of the heart and pump ankles as often as possible o Elevate leg(s) parallel to the floor when sitting. o DO YOUR BEST to sleep in the bed at night. DO NOT sleep in your recliner. Long hours of sitting in a recliner leads to swelling of the legs and/or potential wounds on your backside. Off-Loading o Other: - Offloading boots while in bed; Try pillow under sheet to keep it in place. Additional Orders / Instructions o Follow Nutritious Diet and Increase Protein Intake Wound Treatment Wound #3 - Foot Wound Laterality: Left Cleanser: Soap and Water 2 x Per Week/30 Days Discharge Instructions: Gently cleanse wound with antibacterial soap, rinse and pat dry prior to dressing wounds Primary Dressing: Hydrofera Blue Ready Transfer Foam, 2.5x2.5 (in/in) 2 x Per Week/30 Days Discharge Instructions: ONY ON TOP of wound only-Apply Hydrofera Blue Ready to wound bed as directed Secondary Dressing: Zetuvit Plus Silicone Border Dressing 4x4 (in/in) 2 x Per Week/30 Days Secured With: Tubigrip Size D, 3x10 (in/yd) 2 x Per Week/30 Days Electronic Signature(s) Signed: 03/14/2021 4:40:19 PM By: Donnamarie Poag Signed: 03/14/2021 6:09:14 PM By: Worthy Keeler  PA-C Entered By: Donnamarie Poag on 03/14/2021 11:54:39 [name], [name] ([id number]) -------------------------------------------------------------------------------- Problem List Details Patient Name: [name], [name] Date of Service: 03/14/2021 11:30 AM Medical Record Number: [id number] Patient Account Number: [id number] Date of Birth/Sex: 11-17-1926 (88 y.o. F) Treating RN: Donnamarie Poag Primary Care Provider: [name] Other Clinician: Referring Provider: [name] Treating Provider/Extender: Skipper Cliche in Treatment: 37 Active Problems ICD-10 Encounter Code Description Active Date MDM Diagnosis L89.893 Pressure ulcer of other site, stage 3 10/02/2020 No Yes I10 Essential (primary) hypertension 06/26/2020 No Yes F01.50 Vascular dementia without behavioral disturbance 06/26/2020 No Yes I25.10 Atherosclerotic heart disease of native coronary artery without angina  06/26/2020 No Yes pectoris Inactive Problems Resolved Problems ICD-10 Code Description Active Date Resolved Date L89.623 Pressure ulcer of left heel, stage 3 06/26/2020 06/26/2020 Electronic Signature(s) Signed: 03/14/2021 11:52:43 AM By: Worthy Keeler PA-C Entered By: Worthy Keeler on 03/14/2021 11:52:43 [name], [name] ([id number]) -------------------------------------------------------------------------------- Progress Note Details Patient Name: Ingalls, [name] [name] L. Date of Service: 03/14/2021 11:30 AM Medical Record Number: [id number] Patient Account Number: [id number] Date of Birth/Sex: November 29, 1926 (88 y.o. F) Treating RN: Donnamarie Poag Primary Care Provider: [name] Other Clinician: Referring Provider: [name] Treating Provider/Extender: Skipper Cliche in Treatment: 37 Subjective Chief Complaint Information obtained from Patient Left Heel Pressure Ulcer History of Present Illness (HPI) 88 year old patient was recently seen by the PCPs office for significant pain right great toe which  has been going on since July. She was initially treated with Keflex which she did not complete and after the office visit this time she has been put on doxycycline. at the time of her visit she was found to have a ulcer on the plantar surface of the right great toe and also had a pyogenic granuloma over this area. X-ray of the right foot done 12/29/2014 -- IMPRESSION:Soft-tissue swelling and ulceration right great toe. No underlying bony lytic lesion identified. If osteomyelitis remains of clinical concern MRI can be obtained. Past medical history significant for anemia, chronic kidney disease stage III, obesity, varicose veins, coronary artery disease, gout, history of nicotine addiction given up smoking in 2002, hypertension, status post cardiac catheterization, status post abdominal hysterectomy, cholecystectomy and tonsillectomy. hemoglobin A1c done in August was 5.8 01/22/2015 -- at this stage the [facility] boat was going to cost them significant amount of money and they want to defer using that at the present time. 01/29/2015 -- she had a podiatry appointment and they have trimmed her toenails. She has not heard back from the vascular office regarding her venous duplex study and I have asked them to call personally so that they can get the appointment soon. 02/06/2015 -- they have made contact with the vascular office and from what I understand that test has been done but the report is pending. 02/19/2014 -- the vascular test is scheduled for tomorrow Readmission: 06/26/2020 upon evaluation today patient appears for initial evaluation in her clinic that she has been here before in 2016 and has been quite sometime. She did have a fractured hip in February on the 23rd 2022. Subsequently this had to be pinned and she ended up with a pressure injury on her heel following when she was using her foot to help move her around in the bed. Subsequently this has led to the wound that she has been  dealing with since that time. She lives at home with her daughter currently she does have dementia. 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 07/03/2020 upon evaluation today patient appears to be doing well with regard to her heel ulcer. She did have arterial studies they appear to be doing excellent it was premature normal across the board with TBI's in the 90s and ABIs well within normal range. Nonetheless there does not appear to be any signs of arterial insufficiency whatsoever. With that being said the patient does have also signs of improvement there is some necrotic tissue in the base of the wound number to try to clear some of that away today I do believe the Iodoflex/Iodosorb is  doing well. 5/24; difficult punched out wound on the left medial heel. She has been using Iodoflex 07/17/2020 upon evaluation today patient appears to be doing well at this point in regard to her wound. I do feel like this is a little bit deeper but again that is what is expected as we continue with the Iodoflex I think that this is just going to get deeper until we get to the base of the wound. With that being said I think we are getting much closer to the base of the wound where we can have healthier tissue that we get a be managing here which that will be awesome. In the meantime I am not surprised by what I am seeing and in fact the wound appears to be better as compared to previous findings. 07/24/2020 upon evaluation today patient appears to be doing well with regard to her wound. Overall I am extremely pleased with where things stand today. I do not see any signs of active infection which is great and overall I think that the patient is making good progress. There is good to be some need for sharp debridement today. 07/31/2020 upon evaluation today patient appears to be doing better  in regard to her heel ulcer. She has been tolerating the dressing changes without complication. Fortunately there does not appear to be any signs of active infection which is great and overall very pleased in this regard. No fevers, chills, nausea, vomiting, or diarrhea. 08/14/2020 upon evaluation today patient appears to be doing better in regard to her heel ulcer. I am very pleased in that regard. Unfortunately she still has a slight deep tissue injury in regard to the medial portion of her foot over the same area. Unfortunately I think this is something that if were not careful it was can open up into the wound. That is my main concern here based on what I see. Fortunately there does not appear to be any evidence of active infection which is great news and overall very pleased with where things stand at this point. 08/21/2020 upon evaluation today patient appears to be doing well with regard to her wound. She has been tolerating the dressing changes without complication. Fortunately there does not appear to be any signs of active infection at this time. No fevers, chills, nausea, vomiting, or diarrhea. I do believe the compression wrap was beneficial for her. 08/28/2020 upon evaluation today patient appears to be doing well with regard to her wounds currently. Fortunately there does not appear to be any signs of active infection at this time. No fevers, chills, nausea, vomiting, or diarrhea. With that being said I think that her leg is doing quite well to be honest. Crites, Yahira L. ([id number]) 09/04/2020 upon evaluation today patient appears to be doing well with regard to her wound on the heel. This is showing signs of good epithelial growth although there is probably can it be an indention where this heals that is okay as long as we get it closed. Fortunately I do not see any evidence of infection at this point. 09/13/2020 upon evaluation today patient appears to be doing well with regard to her  wounds. She has been tolerating the dressing changes without complication. Fortunately he is actually doing extremely well and I think she is making great progress this is measuring smaller. I would recommend that such that we continue with the [facility] likely since he is doing so well. 09/18/2020 upon evaluation today patient appears to be doing  well with regard to her heel ulcer. She is making good progress and I am very pleased with what we see today. I think the Hydrofera Blue is still doing excellent. 10/02/2020 upon evaluation today patient's wound actually appears to be showing signs of good improvement in regard to the heel. I am very pleased in this regard. With that being said I do believe that the area which is somewhat been stable and dry is starting to lift up and beginning to clear this away as well that is on the foot. Subsequently I am going to have to perform some debridement here and we did actually go ahead and allow this as a wound today as before it was just more deep tissue injury and eschar covering now I think it is a little bit more than that to be honest. 10/09/2020 upon evaluation today patient appears to be doing decently well in regard to her wounds. I am actually very pleased with where things stand today. There does not appear to be any signs of active infection which is great news. No fevers, chills, nausea, vomiting, or diarrhea. She is going require some sharp debridement today. 10/16/2020 upon evaluation today patient appears to be doing decently well in regard to her heel as well as the foot. Both are showing signs of good improvement which is great news. In general and extremely pleased with where things stand at this point. She is tolerating the [facility] well although this is getting so small in the heel I think collagen may be a better option here based on what I am seeing. With regard to the foot the Iodoflex/Iodosorb is still probably the best method  here. 10/26/2020 upon evaluation today patient actually appears to be doing well in some respects today. She has been tolerating the dressing changes without complication and this is great news. The Hydrofera Blue is done well for the heel this actually appears to be healed today. With that being said in regard to the foot I think we are ready to switch to [facility] based on what I see at this point. 10/30/2020 upon evaluation today patient appears to be doing well with regard to his wound. He has been tolerating the dressing changes without complication. Fortunately there does not appear to be any signs of active infection systemically at this time which is great news and overall very pleased with where things stand today. I do think that she is making progress although it somewhat slow still where showing signs of improvement little by little here. 11/06/2020 upon evaluation today patient appears to be doing decently well in regard to her wound. We will start and see better overall appearance of the base of the wound which is great news she still continues to have some abnormal fluorescence signals with a MolecuLight DX that will be detailed below. 11/20/2020 upon evaluation today patient's wound actually showing signs of improvement. There is good to be some need for sharp debridement today and that was discussed with the patient. I am going to go ahead and clean this up but I do believe the [facility] is doing a great job. 11/27/2020; the patient had eschar over the original heel wound which I removed with a #3 curette there was nothing open here. The area is on the lateral left foot foot. 12/04/2020 upon evaluation today patient appears to be doing well with regard to her wound. The wound bed actually appeared to be doing well after I remove the dry endoform  from the wound bed. This I tracked little bit of fluid but nonetheless underneath this appears to be doing awesome. I am very pleased  with where things stand today. No fevers, chills, nausea, vomiting, or diarrhea. 12/11/2020 upon evaluation today patient appears to be doing well with regard to her wound although it keeps getting dry and filled up with the endoform I am not seeing any signs of infection but we are going to have to clean this away in order to try and get things moving in an appropriate direction. I discussed that with patient's daughter today as well. She is in agreement with proceeding as such. 12/21/2020 upon evaluation today patient appears to be doing well with regard to her wound this did not seal off like it was with the collagen but nonetheless also did not really feeling quite as much as I would like to have seen. I do believe that we may need to go ahead and see about doing a debridement today and I really feel like we may want to switch back to the [facility] which I felt like was doing better in the past. Good news is the base of the wound appears healthy with good granulation tissue. 12/28/2020 upon evaluation patient's wound actually showed signs of doing quite well in regard to her foot. I think that we getting very close to complete resolution and overall I am extremely happy with where things stand today. 01/04/2021 upon evaluation today patient appears to be doing well with regard to her wound on the foot. This is good to require little bit of debridement today but overall seems to be doing quite well. I am actually very pleased with where we stand currently. 01/08/2021 upon evaluation today patient's wound actually showing signs of some improvement although she still has some callus buildup around the edges of the wound unfortunately I think this is continuing to rub despite what we are doing currently. I think that we may need to go ahead and see about switching up the dressing a little bit I Minna recommend collagen and then on top of the collagen we will get a use some foam double layered cut  in a doughnut to try to take pressure off of this area we will see how this does over the next week. 01/15/2021 upon evaluation today patient's wound is actually showing signs of being completely dried over with the collagen. This is definitely not what we are looking for. I think that the wound dried out too quickly is the main issue that we are running into at this point and I really think we may need to do something else try to keep that from occurring. 01/22/2021 upon evaluation today patient's wound actually did stay open it did not callus over as its been doing in the past this is good news. Also feel like it may not be quite as deep as what we have previously seen. There is still not as much improvement he does what I would like to see but nonetheless I think we are headed in the right direction. 01/29/2021 upon evaluation today patient appears to be doing okay in regard to her wound is not too dry but unfortunately is too wet the Xeroform just did not do what I was hoping that she made things a little bit worse as far as size wise I am actually going to try at this point considering this is more open but not as deep the Hydrofera Blue to see if this  will be beneficial at this time. Again we seem to be cycling through things and it is becoming a little difficult to get this to completely close but in the past Hydrofera Blue is what she did the best with 1 week to keep Favata, Evolett L. ([id number]) it from closing up prematurely. 02/05/2021 upon evaluation today patient's wound actually showing some signs of improvement which is great news. Fortunately I do not see any evidence of active infection locally nor systemically. It is definitely not too wet which is been the biggest issue we have seen up to this point over the past several weeks. 02/21/2021 upon evaluation today patient appears to be doing excellent in regard to her wound. This actually appears much improved compared to last time I  saw her which is great news as well. No fevers, chills, nausea, vomiting, or diarrhea. 02/28/2021 upon evaluation today patient's wound on her foot actually appears to be doing quite well. I am very pleased with where things stand today. I do not see any evidence of active infection locally nor systemically at this time. No fevers, chills, nausea, vomiting, or diarrhea. 03/14/2021 upon evaluation today patient appears to be doing well with regard to her wound. There is a little bit of a limp on the plantar edge of the wound opening which I Minna see about trying to clear away today. Fortunately I think other than this everything seems to be showing signs of improvement measurements still are roughly the same. It is definitely not as deep however. Objective Constitutional Well-nourished and well-hydrated in no acute distress. Vitals Time Taken: 11:40 AM, Height: 58 in, Weight: 137 lbs, BMI: 28.6, Temperature: 98.3 F, Pulse: 92 bpm, Respiratory Rate: 16 breaths/min, Blood Pressure: 124/70 mmHg. Respiratory normal breathing without difficulty. Psychiatric this patient is able to make decisions and demonstrates good insight into disease process. Alert and Oriented x 3. pleasant and cooperative. General Notes: Upon inspection patient's wound bed showed signs of good granulation and epithelization at this point. Fortunately I do not see any evidence of active infection locally nor systemically at this time and overall I think that we are headed in the right direction. Integumentary (Hair, Skin) Wound #3 status is Open. Original cause of wound was Pressure Injury. The date acquired was: 10/02/2020. The wound has been in treatment 23 weeks. The wound is located on the Left Foot. The wound measures 0.2cm length x 0.2cm width x 0.3cm depth; 0.031cm^2 area and 0.009cm^3 volume. There is Fat Layer (Subcutaneous Tissue) exposed. There is no tunneling or undermining noted. There is a medium amount of  serous drainage noted. The wound margin is thickened. There is large (67-100%) red, pink granulation within the wound bed. There is a small (1-33%) amount of necrotic tissue within the wound bed including Adherent Slough. Assessment Active Problems ICD-10 Pressure ulcer of other site, stage 3 Essential (primary) hypertension Vascular dementia without behavioral disturbance Atherosclerotic heart disease of native coronary artery without angina pectoris Procedures Wound #3 Pre-procedure diagnosis of Wound #3 is a Pressure Ulcer located on the Left Foot . There was a Excisional Skin/Subcutaneous Tissue Debridement with a total area of 0.04 sq cm performed by [name] Sams., PA-C. With the following instrument(s): Curette to remove Viable and Non-Viable tissue/material. Material removed includes Subcutaneous Tissue and Slough and after achieving pain control using Lidocaine. A time out was conducted at 11:54, prior to the start of the procedure. A Minimum amount of bleeding was controlled with Pressure. The procedure was tolerated Nile,  Anuradha L. ([id number]) well. Post Debridement Measurements: 0.3cm length x 0.2cm width x 0.3cm depth; 0.014cm^3 volume. Post debridement Stage noted as Category/Stage III. Character of Wound/Ulcer Post Debridement is improved. Post procedure Diagnosis Wound #3: Same as Pre-Procedure Plan Follow-up Appointments: Return Appointment in 1 week. Nurse Visit as needed Bathing/ Shower/ Hygiene: May shower with wound dressing protected with water repellent cover or cast protector. No tub bath. Anesthetic (Use 'Patient Medications' Section for Anesthetic Order Entry): Lidocaine applied to wound bed Edema Control - Lymphedema / Segmental Compressive Device / Other: Optional: One layer of unna paste to top of compression wrap (to act as an anchor). Tubigrip single layer applied. - left leg size D Patient to wear own compression stockings. Remove compression  stockings every night before going to bed and put on every morning when getting up. - right leg Elevate legs to the level of the heart and pump ankles as often as possible Elevate leg(s) parallel to the floor when sitting. DO YOUR BEST to sleep in the bed at night. DO NOT sleep in your recliner. Long hours of sitting in a recliner leads to swelling of the legs and/or potential wounds on your backside. Off-Loading: Other: - Offloading boots while in bed; Try pillow under sheet to keep it in place. Additional Orders / Instructions: Follow Nutritious Diet and Increase Protein Intake WOUND #3: - Foot Wound Laterality: Left Cleanser: Soap and Water 2 x Per Week/30 Days Discharge Instructions: Gently cleanse wound with antibacterial soap, rinse and pat dry prior to dressing wounds Primary Dressing: Hydrofera Blue Ready Transfer Foam, 2.5x2.5 (in/in) 2 x Per Week/30 Days Discharge Instructions: ONY ON TOP of wound only-Apply Hydrofera Blue Ready to wound bed as directed Secondary Dressing: Zetuvit Plus Silicone Border Dressing 4x4 (in/in) 2 x Per Week/30 Days Secured With: Tubigrip Size D, 3x10 (in/yd) 2 x Per Week/30 Days 1. I would recommend that we go ahead and continue with the previous wound care measures. I think that the patient has been doing excellent and I do feel like that the [facility] is still doing a good job we will continue as such. 2. We will continue with the Zetuvit border foam dressing to cover. 3. I am also going to suggest we continue with a Tubigrip size D which I think is doing a great job for her. We will see patient back for reevaluation in 1 week here in the clinic. If anything worsens or changes patient will contact our office for additional recommendations. Electronic Signature(s) Signed: 03/14/2021 11:59:05 AM By: Worthy Keeler PA-C Entered By: Worthy Keeler on 03/14/2021 11:59:04 Hummel, Karely L.  ([id number]) -------------------------------------------------------------------------------- SuperBill Details Patient Name: Suchecki, Elke L. Date of Service: 03/14/2021 Medical Record Number: [id number] Patient Account Number: [id number] Date of Birth/Sex: 09/25/1926 (88 y.o. F) Treating RN: Donnamarie Poag Primary Care Provider: [name] Other Clinician: Referring Provider: [name] Treating Provider/Extender: Skipper Cliche in Treatment: 37 Diagnosis Coding ICD-10 Codes Code Description [phone] Pressure ulcer of other site, stage 3 I10 Essential (primary) hypertension I25.10 Atherosclerotic heart disease of native coronary artery without angina pectoris Vascular dementia, unspecified severity, without behavioral disturbance, psychotic disturbance, mood disturbance, and F01.50 anxiety Facility Procedures CPT4 Code: [id number] Description: 11042 - DEB SUBQ TISSUE 20 SQ CM/< Modifier: Quantity: 1 CPT4 Code: Description: ICD-10 Diagnosis Description L89.893 Pressure ulcer of other site, stage 3 Modifier: Quantity: Physician Procedures CPT4 Code: [id number] Description: 11042 - WC PHYS SUBQ TISS 20 SQ CM Modifier: Quantity: 1 CPT4 Code: Description: ICD-10  Diagnosis Description L89.893 Pressure ulcer of other site, stage 3 Modifier: Quantity: Electronic Signature(s) Signed: 03/14/2021 11:59:14 AM By: Worthy Keeler PA-C Entered By: Worthy Keeler on 03/14/2021 11:59:13

## 2021-03-14 NOTE — Progress Notes (Signed)
Kuhrt, Alpa L. ([id number]) Visit Report for 03/14/2021 Arrival Information Details Patient Name: [name] [name], [name] Date of Service: 03/14/2021 11:30 AM Medical Record Number: [id number] Patient Account Number: [id number] Date of Birth/Sex: [birth year] (88 y.o. F) Treating RN: [name] [name] Primary Care [name] [name]: [name] [name] Other Clinician: Referring [name] [name]: [name] [name] Treating [name] [name]/Extender: [name] [name] in Treatment: 78 Visit Information History Since Last Visit Added or deleted any medications: No Patient Arrived: Ambulatory Had a fall or experienced change in No Arrival Time: 11:40 activities of daily living that may affect Accompanied By: daughter risk of falls: Transfer Assistance: Easy[employer] Patient Lift Hospitalized since last visit: No Patient Identification Verified: Yes Has Dressing in Place as Prescribed: Yes Secondary Verification Process Completed: Yes Pain Present Now: No Patient Requires Transmission-Based No Precautions: Patient Has Alerts: Yes Patient Alerts: NOT diabetic ABI R1.18 L1.25 06/27/20 Electronic Signature(s) Signed: 03/14/2021 4:40:19 PM By: [name] [name] Entered ByDonnamarie [name] on 03/14/2021 11:43:21 Phenix, [name] ([id number]) -------------------------------------------------------------------------------- Encounter Discharge Information Details Patient Name: [name], [name] Date of Service: 03/14/2021 11:30 AM Medical Record Number: [id number] Patient Account Number: [id number] Date of Birth/Sex: 1926/12/08 (88 y.o. F) Treating RN: [name] [name] Primary Care Mozelle Remlinger: [name] [name] Other Clinician: Referring Majorie Santee: [name] [name] Treating [name] [name]/Extender: [name] [name] in Treatment: 37 Encounter Discharge Information Items Post Procedure Vitals Discharge Condition: Stable Temperature (F): 98.3 Ambulatory Status: Wheelchair Pulse (bpm): 92 Discharge Destination: Home Respiratory Rate (breaths/min):  16 Transportation: Private Auto Blood Pressure (mmHg): 124/70 Accompanied By: daughter Schedule Follow-up Appointment: Yes Clinical Summary of Care: Electronic Signature(s) Signed: 03/14/2021 4:40:19 PM By: [name] [name] Entered ByDonnamarie [name] on 03/14/2021 12:02:42 [name], [name] ([id number]) -------------------------------------------------------------------------------- Lower Extremity Assessment Details Patient Name: Cure, [name] Date of Service: 03/14/2021 11:30 AM Medical Record Number: [id number] Patient Account Number: [id number] Date of Birth/Sex: 09/25/1926 (88 y.o. F) Treating RN: [name] [name] Primary Care Shadi Sessler: [name] [name] Other Clinician: Referring [name] [name]: [name] [name] Treating Latausha Flamm/Extender: [name] [name] in Treatment: 37 Edema Assessment Assessed: [name] [name]: Yes] [name] [name]: No] Edema: [Left: N] [Right: o] Vascular Assessment Pulses: Dorsalis Pedis Palpable: [Left:Yes] Electronic Signature(s) Signed: 03/14/2021 4:40:19 PM By: [name] [name] Entered By: [name] [name] on 03/14/2021 11:48:39 [name] [name] ([id number]) -------------------------------------------------------------------------------- Multi Wound Chart Details Patient Name: Oh, [name] Date of Service: 03/14/2021 11:30 AM Medical Record Number: [id number] Patient Account Number: [id number] Date of Birth/Sex: [birth year] (88 y.o. F) Treating RN: [name] [name] Primary Care Thursa Emme: [name] [name] Other Clinician: Referring Nikiyah Fackler: [name] [name] Treating Jaqlyn Gruenhagen/Extender: [name] [name] in Treatment: 37 Vital Signs Height(in): 58 Pulse(bpm): 30 Weight(lbs): 137 Blood Pressure(mmHg): 124/70 Body Mass Index(BMI): 28.6 Temperature(F): 98.3 Respiratory Rate(breaths/min): 16 Photos: [N/A:N/A] Wound Location: Left Foot N/A N/A Wounding Event: Pressure Injury N/A N/A Primary Etiology: Pressure Ulcer N/A N/A Comorbid History: Cataracts, Anemia, Coronary Artery N/A  N/A Disease, Hypertension, Myocardial Infarction, End Stage Renal Disease, Gout, Osteoarthritis, Dementia Date Acquired: 10/02/2020 N/A N/A Weeks of Treatment: 23 N/A N/A Wound Status: Open N/A N/A Wound Recurrence: No N/A N/A Measurements L x W x D (cm) 0.2x0.2x0.3 N/A N/A Area (cm) : 0.031 N/A N/A Volume (cm) : 0.009 N/A N/A % Reduction in Area: 90.10% N/A N/A % Reduction in Volume: 71.00% N/A N/A Classification: Category/Stage III N/A N/A Exudate Amount: Medium N/A N/A Exudate Type: Serous N/A N/A Exudate Color: amber N/A N/A Wound Margin: Thickened N/A N/A Granulation Amount: Large (67-100%) N/A N/A Granulation Quality: Red, Pink N/A N/A Necrotic Amount: Small (1-33%) N/A N/A Exposed Structures: Fat Layer (Subcutaneous Tissue): N/A N/A  Yes Fascia: No Tendon: No Muscle: No Joint: No Bone: No Epithelialization: None N/A N/A Treatment Notes Electronic Signature(s) Signed: 03/14/2021 4:40:19 PM By: [name] [name] Entered By: [name] [name] on 03/14/2021 11:49:03 [name] [name] ([id number]) [name], [name] ([id number]) -------------------------------------------------------------------------------- [place] Details Patient Name: [name], [name] Date of Service: 03/14/2021 11:30 AM Medical Record Number: [id number] Patient Account Number: [id number] Date of Birth/Sex: 1926/04/16 (88 y.o. F) Treating RN: [name] [name] Primary Care [name] [name]: [name] [name] Other Clinician: Referring [name] [name]: [name] [name] Treating [name] [name]/Extender: [name] [name] in Treatment: 37 Active Inactive Electronic Signature(s) Signed: 03/14/2021 4:40:19 PM By: [name] [name] Entered ByDonnamarie [name] on 03/14/2021 11:48:48 [name], [name] ([id number]) -------------------------------------------------------------------------------- Pain Assessment Details Patient Name: [name], [name] Date of Service: 03/14/2021 11:30 AM Medical Record Number: [id number] Patient  Account Number: [id number] Date of Birth/Sex: 04/30/1926 (88 y.o. F) Treating RN: [name] [name] Primary Care Hend Mccarrell: [name] [name] Other Clinician: Referring [name] [name]: [name] [name] Treating [name] [name]/Extender: [name] [name] in Treatment: 37 Active Problems Location of Pain Severity and Description of Pain Patient Has Paino No Site Locations Rate the pain. Current Pain Level: 0 Pain Management and Medication Current Pain Management: Electronic Signature(s) Signed: 03/14/2021 4:40:19 PM By: [name] [name] Entered By: [name] [name] on 03/14/2021 11:46:53 Angelos, [name] [name] ([id number]) -------------------------------------------------------------------------------- Patient/Caregiver Education Details Patient Name: [name] [name], [name] Date of Service: 03/14/2021 11:30 AM Medical Record Number: [id number] Patient Account Number: [id number] Date of Birth/Gender: 02-27-1926 (88 y.o. F) Treating RN: [name] [name] Primary Care Physician: [name] [name] Other Clinician: Referring Physician: Lelon [name] Treating Physician/Extender: [name] [name] in Treatment: 52 Education Assessment Education Provided To: Caregiver Education Topics Provided Basic Hygiene: Wound/Skin Impairment: Electronic Signature(s) Signed: 03/14/2021 4:40:19 PM By: [name] [name] Entered ByDonnamarie [name] on 03/14/2021 11:53:36 Rhinesmith, [name] ([id number]) -------------------------------------------------------------------------------- Wound Assessment Details Patient Name: Herringshaw, [name] Date of Service: 03/14/2021 11:30 AM Medical Record Number: [id number] Patient Account Number: [id number] Date of Birth/Sex: 06-May-1926 (88 y.o. F) Treating RN: [name] [name] Primary Care Cedric Mcclaine: [name] [name] Other Clinician: Referring Jamiya Nims: [name] [name] Treating Viana Sleep/Extender: [name] [name] in Treatment: 37 Wound Status Wound Number: 3 Primary Pressure Ulcer Etiology: Wound Location: Left Foot Wound  Open Wounding Event: Pressure Injury Status: Date Acquired: 10/02/2020 Comorbid Cataracts, Anemia, Coronary Artery Disease, Weeks Of Treatment: 23 History: Hypertension, Myocardial Infarction, End Stage Renal Clustered Wound: No Disease, Gout, Osteoarthritis, Dementia Photos Wound Measurements Length: (cm) 0.2 Width: (cm) 0.2 Depth: (cm) 0.3 Area: (cm) 0.031 Volume: (cm) 0.009 % Reduction in Area: 90.1% % Reduction in Volume: 71% Epithelialization: None Tunneling: No Undermining: No Wound Description Classification: Category/Stage III Wound Margin: Thickened Exudate Amount: Medium Exudate Type: Serous Exudate Color: amber Foul Odor After Cleansing: No Slough/Fibrino Yes Wound Bed Granulation Amount: Large (67-100%) Exposed Structure Granulation Quality: Red, Pink Fascia Exposed: No Necrotic Amount: Small (1-33%) Fat Layer (Subcutaneous Tissue) Exposed: Yes Necrotic Quality: Adherent Slough Tendon Exposed: No Muscle Exposed: No Joint Exposed: No Bone Exposed: No Treatment Notes Wound #3 (Foot) Wound Laterality: Left Cleanser Soap and Water Discharge Instruction: Gently cleanse wound with antibacterial soap, rinse and pat dry prior to dressing wounds Coia, [name] ([id number]) Peri-Wound Care Topical Primary Dressing Hydrofera Blue Ready Transfer Foam, 2.5x2.5 (in/in) Discharge Instruction: ONY ON TOP of wound only-Apply Hydrofera Blue Ready to wound bed as directed Secondary Dressing Zetuvit Plus Silicone Border Dressing 4x4 (in/in) Secured With Tubigrip Size D, 3x10 (in/yd) Compression Wrap Compression Stockings Add-Ons Electronic Signature(s) Signed: 03/14/2021 4:40:19 PM By: [name] [name] Entered ByLyndel Safe,  Joy on 03/14/2021 11:48:21 Blacksher, [name] [name] ([id number]) -------------------------------------------------------------------------------- Vitals Details Patient Name: Haeberle, [name] Date of Service: 03/14/2021 11:30 AM Medical Record  Number: [id number] Patient Account Number: [id number] Date of Birth/Sex: 19-Sep-1926 (88 y.o. F) Treating RN: [name] [name] Primary Care Kordae Buonocore: [name] [name] Other Clinician: Referring Danelia Snodgrass: [name] [name] Treating Vishal Sandlin/Extender: [name] [name] in Treatment: 37 Vital Signs Time Taken: 11:40 Temperature (F): 98.3 Height (in): 58 Pulse (bpm): 92 Weight (lbs): 137 Respiratory Rate (breaths/min): 16 Body Mass Index (BMI): 28.6 Blood Pressure (mmHg): 124/70 Reference Range: 80 - 120 mg / dl Electronic Signature(s) Signed: 03/14/2021 4:40:19 PM By: [name] [name] Entered ByDonnamarie [name] on 03/14/2021 11:43:53

## 2021-03-22 ENCOUNTER — Other Ambulatory Visit: Payer: Self-pay

## 2021-03-22 ENCOUNTER — Encounter: Payer: Self-pay | Admitting: Podiatry

## 2021-03-22 ENCOUNTER — Ambulatory Visit: Payer: PPO | Admitting: Podiatry

## 2021-03-22 DIAGNOSIS — B351 Tinea unguium: Secondary | ICD-10-CM

## 2021-03-22 DIAGNOSIS — M79676 Pain in unspecified toe(s): Secondary | ICD-10-CM

## 2021-03-22 DIAGNOSIS — L989 Disorder of the skin and subcutaneous tissue, unspecified: Secondary | ICD-10-CM

## 2021-03-22 NOTE — Progress Notes (Signed)
° °  SUBJECTIVE Patient presents to office today complaining of elongated, thickened nails that cause pain while ambulating in shoes.  Patient is unable to trim their own nails.  Dressings left intact to the left lower extremity.  Patient is here for further evaluation and treatment.  Past Medical History:  Diagnosis Date   Hyperlipidemia    Hypertension    Myocardial infarction [facility])     OBJECTIVE General Patient is awake, alert, and oriented x 3 and in no acute distress. Derm Skin is dry and supple bilateral. Negative open lesions or macerations. Remaining integument unremarkable. Nails are tender, long, thickened and dystrophic with subungual debris, consistent with onychomycosis, 1-5 bilateral. No signs of infection noted.  Hyperkeratotic preulcerative callus tissue also noted to the bilateral feet, specifically the right hallux and left posterior heel Vasc capillary refill immediate.  Temperature gradient within normal limits.  Neuro Epicritic and protective threshold sensation grossly intact bilaterally.  Musculoskeletal Exam No symptomatic pedal deformities noted bilateral. Muscular strength within normal limits.  Associated tenderness to palpation at the preulcerative callus lesions  ASSESSMENT 1.  Pain due to onychomycosis of toenails both 2.  Benign preulcerative callus lesions bilateral  PLAN OF CARE 1. Patient evaluated today.  2. Instructed to maintain good pedal hygiene and foot care.  3. Mechanical debridement of nails 1-5 bilaterally performed using a nail nipper.  When I did clip the toenail to the fifth digit left foot there was a small area of pinpoint bleeding after debridement.  Antibiotic cream and a Band-Aid was applied.  This should heal uneventfully 4.  Continue management at the [facility] wound care center  5.  Excisional debridement of the hyperkeratotic callus tissue was performed using a 312 scalpel without incident or bleeding  6.  Return to clinic in 3 mos.     Edrick Kins, DPM [facility]  Dr. Edrick Kins, DPM    [address], [place] [address]                Office [phone]  Fax [phone]

## 2021-03-24 ENCOUNTER — Other Ambulatory Visit: Payer: Self-pay | Admitting: Family Medicine

## 2021-03-24 DIAGNOSIS — I10 Essential (primary) hypertension: Secondary | ICD-10-CM

## 2021-03-28 ENCOUNTER — Other Ambulatory Visit: Payer: Self-pay

## 2021-03-28 ENCOUNTER — Encounter: Payer: PPO | Attending: Physician Assistant | Admitting: Physician Assistant

## 2021-03-28 DIAGNOSIS — I10 Essential (primary) hypertension: Secondary | ICD-10-CM | POA: Diagnosis not present

## 2021-03-28 DIAGNOSIS — F0150 Vascular dementia without behavioral disturbance: Secondary | ICD-10-CM | POA: Diagnosis not present

## 2021-03-28 DIAGNOSIS — L89893 Pressure ulcer of other site, stage 3: Secondary | ICD-10-CM | POA: Diagnosis not present

## 2021-03-28 DIAGNOSIS — E669 Obesity, unspecified: Secondary | ICD-10-CM | POA: Insufficient documentation

## 2021-03-28 DIAGNOSIS — I2510 Atherosclerotic heart disease of native coronary artery without angina pectoris: Secondary | ICD-10-CM | POA: Insufficient documentation

## 2021-03-28 DIAGNOSIS — N183 Chronic kidney disease, stage 3 unspecified: Secondary | ICD-10-CM | POA: Diagnosis not present

## 2021-03-28 DIAGNOSIS — I129 Hypertensive chronic kidney disease with stage 1 through stage 4 chronic kidney disease, or unspecified chronic kidney disease: Secondary | ICD-10-CM | POA: Diagnosis not present

## 2021-03-28 DIAGNOSIS — F0154 Vascular dementia, unspecified severity, with anxiety: Secondary | ICD-10-CM | POA: Diagnosis not present

## 2021-03-28 NOTE — Progress Notes (Addendum)
Nine, Doreather L. ([id number]) Visit Report for 03/28/2021 Arrival Information Details Patient Name: [name] [name], [name] Date of Service: 03/28/2021 11:30 AM Medical Record Number: [id number] Patient Account Number: [id number] Date of Birth/Sex: 06-Feb-1927 (88 y.o. F) Treating RN: Donnamarie Poag Primary Care Asaph Serena: [name] Other Clinician: Referring Josefa [place]: [name] Treating Roddrick Sharron/Extender: Skipper Cliche in Treatment: 39 Visit Information History Since Last Visit Added or deleted any medications: No Patient Arrived: Wheel Chair Had a fall or experienced change in No Arrival Time: 11:36 activities of daily living that may affect Accompanied By: daughter risk of falls: Transfer Assistance: None Hospitalized since last visit: No Patient Identification Verified: Yes Has Dressing in Place as Prescribed: Yes Secondary Verification Process Completed: Yes Pain Present Now: No Patient Requires Transmission-Based No Precautions: Patient Has Alerts: Yes Patient Alerts: NOT diabetic ABI R1.18 L1.25 06/27/20 Electronic Signature(s) Signed: 03/28/2021 3:00:07 PM By: Donnamarie Poag Entered ByDonnamarie Poag on 03/28/2021 11:38:59 Reta, Korynne L. ([id number]) -------------------------------------------------------------------------------- Clinic Level of Care Assessment Details Patient Name: [name] [name], [name] Date of Service: 03/28/2021 11:30 AM Medical Record Number: [id number] Patient Account Number: [id number] Date of Birth/Sex: 1926/06/20 (88 y.o. F) Treating RN: Donnamarie Poag Primary Care Arlicia Paquette: [name] Other Clinician: Referring Shandricka Monroy: [name] Treating Maleigh Bagot/Extender: Skipper Cliche in Treatment: 39 Clinic Level of Care Assessment Items TOOL 4 Quantity Score []  - Use when only an EandM is performed on FOLLOW-UP visit 0 ASSESSMENTS - Nursing Assessment / Reassessment []  - Reassessment of Co-morbidities (includes updates in patient status) 0 []  -  0 Reassessment of Adherence to Treatment Plan ASSESSMENTS - Wound and Skin Assessment / Reassessment X - Simple Wound Assessment / Reassessment - one wound 1 5 []  - 0 Complex Wound Assessment / Reassessment - multiple wounds []  - 0 Dermatologic / Skin Assessment (not related to wound area) ASSESSMENTS - Focused Assessment []  - Circumferential Edema Measurements - multi extremities 0 []  - 0 Nutritional Assessment / Counseling / Intervention []  - 0 Lower Extremity Assessment (monofilament, tuning fork, pulses) []  - 0 Peripheral Arterial Disease Assessment (using hand held doppler) ASSESSMENTS - Ostomy and/or Continence Assessment and Care []  - Incontinence Assessment and Management 0 []  - 0 Ostomy Care Assessment and Management (repouching, etc.) PROCESS - Coordination of Care X - Simple Patient / Family Education for ongoing care 1 15 []  - 0 Complex (extensive) Patient / Family Education for ongoing care []  - 0 Staff obtains Programmer, systems, Records, Test Results / Process Orders []  - 0 Staff telephones HHA, Nursing Homes / Clarify orders / etc []  - 0 Routine Transfer to another Facility (non-emergent condition) []  - 0 Routine Hospital Admission (non-emergent condition) []  - 0 New Admissions / Biomedical engineer / Ordering NPWT, Apligraf, etc. []  - 0 Emergency Hospital Admission (emergent condition) X- 1 10 Simple Discharge Coordination []  - 0 Complex (extensive) Discharge Coordination PROCESS - Special Needs []  - Pediatric / Minor Patient Management 0 []  - 0 Isolation Patient Management []  - 0 Hearing / Language / Visual special needs []  - 0 Assessment of Community assistance (transportation, D/C planning, etc.) []  - 0 Additional assistance / Altered mentation []  - 0 Support Surface(s) Assessment (bed, cushion, seat, etc.) INTERVENTIONS - Wound Cleansing / Measurement Koc, Alvia L. ([id number]) X- 1 5 Simple Wound Cleansing - one wound []  - 0 Complex Wound  Cleansing - multiple wounds X- 1 5 Wound Imaging (photographs - any number of wounds) []  - 0 Wound Tracing (instead of photographs) X- 1 5 Simple Wound Measurement -  one wound []  - 0 Complex Wound Measurement - multiple wounds INTERVENTIONS - Wound Dressings X - Small Wound Dressing one or multiple wounds 1 10 []  - 0 Medium Wound Dressing one or multiple wounds []  - 0 Large Wound Dressing one or multiple wounds X- 1 5 Application of Medications - topical []  - 0 Application of Medications - injection INTERVENTIONS - Miscellaneous []  - External ear exam 0 []  - 0 Specimen Collection (cultures, biopsies, blood, body fluids, etc.) []  - 0 Specimen(s) / Culture(s) sent or taken to Lab for analysis []  - 0 Patient Transfer (multiple staff / Civil Service fast streamer / Similar devices) []  - 0 Simple Staple / Suture removal (25 or less) []  - 0 Complex Staple / Suture removal (26 or more) []  - 0 Hypo / Hyperglycemic Management (close monitor of Blood Glucose) []  - 0 Ankle / Brachial Index (ABI) - do not check if billed separately X- 1 5 Vital Signs Has the patient been seen at the hospital within the last three years: Yes Total Score: 65 Level Of Care: New/Established - Level 2 Electronic Signature(s) Signed: 03/28/2021 3:00:07 PM By: Donnamarie Poag Entered ByDonnamarie Poag on 03/28/2021 11:53:25 [name] [name], [name] ([id number]) -------------------------------------------------------------------------------- Encounter Discharge Information Details Patient Name: [name] [name], [name] Date of Service: 03/28/2021 11:30 AM Medical Record Number: [id number] Patient Account Number: [id number] Date of Birth/Sex: [birth year] (88 y.o. F) Treating RN: Donnamarie Poag Primary Care Kadynce Bonds: [name] Other Clinician: Referring Andreia Gandolfi: [name] Treating Yasheka Fossett/Extender: Skipper Cliche in Treatment: 39 Encounter Discharge Information Items Discharge Condition: Stable Ambulatory Status:  Walker Discharge Destination: Home Transportation: Private Auto Accompanied By: daughter Schedule Follow-up Appointment: Yes Clinical Summary of Care: Electronic Signature(s) Signed: 03/28/2021 3:00:07 PM By: Donnamarie Poag Entered ByDonnamarie Poag on 03/28/2021 12:01:45 Fournier, Jacob L. ([id number]) -------------------------------------------------------------------------------- Lower Extremity Assessment Details Patient Name: [name] [name], [name] Date of Service: 03/28/2021 11:30 AM Medical Record Number: [id number] Patient Account Number: [id number] Date of Birth/Sex: Jan 15, 1927 (88 y.o. F) Treating RN: Donnamarie Poag Primary Care Nitara Szczerba: [name] Other Clinician: Referring Breana Litts: [name] Treating Renne Platts/Extender: Skipper Cliche in Treatment: 39 Edema Assessment Assessed: Shirlyn Goltz: Yes] [Right: No] Edema: [Left: N] [Right: o] Vascular Assessment Pulses: Dorsalis Pedis Palpable: [Left:Yes] Electronic Signature(s) Signed: 03/28/2021 3:00:07 PM By: Donnamarie Poag Entered By: Donnamarie Poag on 03/28/2021 11:46:03 Bohan, Katarina L. ([id number]) -------------------------------------------------------------------------------- Multi Wound Chart Details Patient Name: [name] [name], [name] Date of Service: 03/28/2021 11:30 AM Medical Record Number: [id number] Patient Account Number: [id number] Date of Birth/Sex: [birth year] (88 y.o. F) Treating RN: Donnamarie Poag Primary Care Yashira Offenberger: [name] Other Clinician: Referring Brigham Cobbins: [name] Treating Masyn Fullam/Extender: Skipper Cliche in Treatment: 39 Vital Signs Height(in): 57 Pulse(bpm): 38 Weight(lbs): 137 Blood Pressure(mmHg): 133/76 Body Mass Index(BMI): 28.6 Temperature(F): 98.2 Respiratory Rate(breaths/min): 16 Photos: [N/A:N/A] Wound Location: Left Foot N/A N/A Wounding Event: Pressure Injury N/A N/A Primary Etiology: Pressure Ulcer N/A N/A Comorbid History: Cataracts, Anemia, Coronary Artery N/A N/A Disease,  Hypertension, Myocardial Infarction, End Stage Renal Disease, Gout, Osteoarthritis, Dementia Date Acquired: 10/02/2020 N/A N/A Weeks of Treatment: 25 N/A N/A Wound Status: Open N/A N/A Wound Recurrence: No N/A N/A Measurements L x W x D (cm) 0.2x0.3x0.2 N/A N/A Area (cm) : 0.047 N/A N/A Volume (cm) : 0.009 N/A N/A % Reduction in Area: 85.00% N/A N/A % Reduction in Volume: 71.00% N/A N/A Classification: Category/Stage III N/A N/A Exudate Amount: Medium N/A N/A Exudate Type: Serous N/A N/A Exudate Color: amber N/A N/A Wound Margin: Thickened N/A N/A Granulation Amount: Large (  67-100%) N/A N/A Granulation Quality: Red, Pink N/A N/A Necrotic Amount: Small (1-33%) N/A N/A Exposed Structures: Fat Layer (Subcutaneous Tissue): N/A N/A Yes Fascia: No Tendon: No Muscle: No Joint: No Bone: No Epithelialization: None N/A N/A Treatment Notes Electronic Signature(s) Signed: 03/28/2021 3:00:07 PM By: Donnamarie Poag Entered ByDonnamarie Poag on 03/28/2021 11:46:37 [name] [name], [name] [name] ([id number]) [name] [name], [name] ([id number]) -------------------------------------------------------------------------------- [place] Details Patient Name: [name] [name], [name] Date of Service: 03/28/2021 11:30 AM Medical Record Number: [id number] Patient Account Number: [id number] Date of Birth/Sex: [birth year] (88 y.o. F) Treating RN: Donnamarie Poag Primary Care Gionna Polak: [name] Other Clinician: Referring Zan Triska: [name] Treating Kaitrin Seybold/Extender: Skipper Cliche in Treatment: 45 Active Inactive Electronic Signature(s) Signed: 03/28/2021 3:00:07 PM By: Donnamarie Poag Entered ByDonnamarie Poag on 03/28/2021 11:46:18 [name] [name], [name] [name] ([id number]) -------------------------------------------------------------------------------- Pain Assessment Details Patient Name: [name] [name], [name] Date of Service: 03/28/2021 11:30 AM Medical Record Number: [id number] Patient Account Number:  [id number] Date of Birth/Sex: January 15, 1927 (88 y.o. F) Treating RN: Donnamarie Poag Primary Care Caree Wolpert: [name] Other Clinician: Referring Jancarlo Biermann: [name] Treating Marvel Sapp/Extender: Skipper Cliche in Treatment: 39 Active Problems Location of Pain Severity and Description of Pain Patient Has Paino No Site Locations Rate the pain. Current Pain Level: 0 Pain Management and Medication Current Pain Management: Electronic Signature(s) Signed: 03/28/2021 3:00:07 PM By: Donnamarie Poag Entered By: Donnamarie Poag on 03/28/2021 11:40:26 Everitt, Yalexa L. ([id number]) -------------------------------------------------------------------------------- Patient/Caregiver Education Details Patient Name: [name] [name], [name] Date of Service: 03/28/2021 11:30 AM Medical Record Number: [id number] Patient Account Number: [id number] Date of Birth/Gender: 1926-02-29 (88 y.o. F) Treating RN: Donnamarie Poag Primary Care Physician: [name] Other Clinician: Referring Physician: [name] Treating Physician/Extender: Skipper Cliche in Treatment: 59 Education Assessment Education Provided To: Caregiver Education Topics Provided Basic Hygiene: Venous: Wound Debridement: Wound/Skin Impairment: Electronic Signature(s) Signed: 03/28/2021 3:00:07 PM By: Donnamarie Poag Entered ByDonnamarie Poag on 03/28/2021 11:51:58 Blancett, Bruce L. ([id number]) -------------------------------------------------------------------------------- Wound Assessment Details Patient Name: [name] [name], [name] Date of Service: 03/28/2021 11:30 AM Medical Record Number: [id number] Patient Account Number: [id number] Date of Birth/Sex: [birth year] (88 y.o. F) Treating RN: Donnamarie Poag Primary Care Tashanda Fuhrer: [name] Other Clinician: Referring Chung Chagoya: [name] Treating Mouhamad Teed/Extender: Skipper Cliche in Treatment: 39 Wound Status Wound Number: 3 Primary Pressure Ulcer Etiology: Wound Location: Left  Foot Wound Open Wounding Event: Pressure Injury Status: Date Acquired: 10/02/2020 Comorbid Cataracts, Anemia, Coronary Artery Disease, Weeks Of Treatment: 25 History: Hypertension, Myocardial Infarction, End Stage Renal Clustered Wound: No Disease, Gout, Osteoarthritis, Dementia Photos Wound Measurements Length: (cm) 0.2 Width: (cm) 0.3 Depth: (cm) 0.2 Area: (cm) 0.047 Volume: (cm) 0.009 % Reduction in Area: 85% % Reduction in Volume: 71% Epithelialization: None Tunneling: No Undermining: No Wound Description Classification: Category/Stage III Wound Margin: Thickened Exudate Amount: Medium Exudate Type: Serous Exudate Color: amber Foul Odor After Cleansing: No Slough/Fibrino Yes Wound Bed Granulation Amount: Large (67-100%) Exposed Structure Granulation Quality: Red, Pink Fascia Exposed: No Necrotic Amount: Small (1-33%) Fat Layer (Subcutaneous Tissue) Exposed: Yes Necrotic Quality: Adherent Slough Tendon Exposed: No Muscle Exposed: No Joint Exposed: No Bone Exposed: No Treatment Notes Wound #3 (Foot) Wound Laterality: Left Cleanser Soap and Water Discharge Instruction: Gently cleanse wound with antibacterial soap, rinse and pat dry prior to dressing wounds Denardo, Camyah L. ([id number]) Peri-Wound Care Topical Primary Dressing Hydrofera Blue Ready Transfer Foam, 2.5x2.5 (in/in) Discharge Instruction: ONY ON TOP of wound only-Apply Hydrofera Blue Ready to wound bed as directed Secondary Dressing Zetuvit Plus Silicone Border Dressing  4x4 (in/in) Secured With Tubigrip Size D, 3x10 (in/yd) Compression Wrap Compression Stockings Add-Ons Electronic Signature(s) Signed: 03/28/2021 3:00:07 PM By: Donnamarie Poag Entered By: Donnamarie Poag on 03/28/2021 11:43:29 Chauncey, Lanyiah L. ([id number]) -------------------------------------------------------------------------------- Vitals Details Patient Name: Cowles, Andrianna L. Date of Service: 03/28/2021 11:30 AM Medical  Record Number: [id number] Patient Account Number: [id number] Date of Birth/Sex: November 30, 1926 (88 y.o. F) Treating RN: Donnamarie Poag Primary Care Babe Clenney: [name] Other Clinician: Referring Jeramy Dimmick: [name] Treating Ozzy Bohlken/Extender: Skipper Cliche in Treatment: 39 Vital Signs Time Taken: 11:38 Temperature (F): 98.2 Height (in): 58 Pulse (bpm): 87 Weight (lbs): 137 Respiratory Rate (breaths/min): 16 Body Mass Index (BMI): 28.6 Blood Pressure (mmHg): 133/76 Reference Range: 80 - 120 mg / dl Electronic Signature(s) Signed: 03/28/2021 3:00:07 PM By: Donnamarie Poag Entered ByDonnamarie Poag on 03/28/2021 11:40:11

## 2021-03-28 NOTE — Progress Notes (Addendum)
Overall, [name] ([id number]) Visit Report for 03/28/2021 Chief Complaint Document Details Patient Name: [name] [name], [name] Date of Service: 03/28/2021 11:30 AM Medical Record Number: [id number] Patient Account Number: [id number] Date of Birth/Sex: 1926/08/19 (88 y.o. F) Treating RN: Donnamarie Poag Primary Care Provider: Lelon [name] Other Clinician: Referring Provider: Lelon [name] Treating Provider/Extender: Skipper Cliche in Treatment: 39 Information Obtained from: Patient Chief Complaint Left Heel Pressure Ulcer Electronic Signature(s) Signed: 03/28/2021 11:28:44 AM By: Worthy Keeler PA-C Entered By: Worthy Keeler on 03/28/2021 11:28:44 [name], [name] ([id number]) -------------------------------------------------------------------------------- HPI Details Patient Name: [name] [name], [name] Date of Service: 03/28/2021 11:30 AM Medical Record Number: [id number] Patient Account Number: [id number] Date of Birth/Sex: 11-10-1926 (88 y.o. F) Treating RN: Donnamarie Poag Primary Care Provider: Lelon [name] Other Clinician: Referring Provider: Lelon [name] Treating Provider/Extender: Skipper Cliche in Treatment: 39 History of Present Illness HPI Description: 88 year old patient was recently seen by the PCPs office for significant pain right great toe which has been going on since July. She was initially treated with Keflex which she did not complete and after the office visit this time she has been put on doxycycline. at the time of her visit she was found to have a ulcer on the plantar surface of the right great toe and also had a pyogenic granuloma over this area. X-ray of the right foot done 12/29/2014 -- IMPRESSION:Soft-tissue swelling and ulceration right great toe. No underlying bony lytic lesion identified. If osteomyelitis remains of clinical concern MRI can be obtained. Past medical history significant for anemia, chronic kidney disease stage III, obesity, varicose veins,  coronary artery disease, gout, history of nicotine addiction given up smoking in 2002, hypertension, status post cardiac catheterization, status post abdominal hysterectomy, cholecystectomy and tonsillectomy. hemoglobin A1c done in August was 5.8 01/22/2015 -- at this stage the [facility] Walking boat was going to cost them significant amount of money and they want to defer using that at the present time. 01/29/2015 -- she had a podiatry appointment and they have trimmed her toenails. She has not heard back from the vascular office regarding her venous duplex study and I have asked them to call personally so that they can get the appointment soon. 02/06/2015 -- they have made contact with the vascular office and from what I understand that test has been done but the report is pending. 02/19/2014 -- the vascular test is scheduled for tomorrow Readmission: 06/26/2020 upon evaluation today patient appears for initial evaluation in her clinic that she has been here before in 2016 and has been quite sometime. She did have a fractured hip in February on the 23rd 2022. Subsequently this had to be pinned and she ended up with a pressure injury on her heel following when she was using her foot to help move her around in the bed. Subsequently this has led to the wound that she has been dealing with since that time. She lives at home with her daughter currently she does have dementia. 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 07/03/2020 upon evaluation today patient appears to be doing well with regard to her heel ulcer. She did have arterial studies they appear to be doing excellent it was premature normal across the board with TBI's in the 90s and ABIs well within normal range. Nonetheless there does not appear to be any signs of arterial insufficiency whatsoever. With that being said  the patient does have also signs of improvement there is some necrotic tissue in the base of the wound number to try to clear some of that away today I do believe the Iodoflex/Iodosorb is doing well. 5/24; difficult punched out wound on the left medial heel. She has been using Iodoflex 07/17/2020 upon evaluation today patient appears to be doing well at this point in regard to her wound. I do feel like this is a little bit deeper but again that is what is expected as we continue with the Iodoflex I think that this is just going to get deeper until we get to the base of the wound. With that being said I think we are getting much closer to the base of the wound where we can have healthier tissue that we get a be managing here which that will be awesome. In the meantime I am not surprised by what I am seeing and in fact the wound appears to be better as compared to previous findings. 07/24/2020 upon evaluation today patient appears to be doing well with regard to her wound. Overall I am extremely pleased with where things stand today. I do not see any signs of active infection which is great and overall I think that the patient is making good progress. There is good to be some need for sharp debridement today. 07/31/2020 upon evaluation today patient appears to be doing better in regard to her heel ulcer. She has been tolerating the dressing changes without complication. Fortunately there does not appear to be any signs of active infection which is great and overall very pleased in this regard. No fevers, chills, nausea, vomiting, or diarrhea. 08/14/2020 upon evaluation today patient appears to be doing better in regard to her heel ulcer. I am very pleased in that regard. Unfortunately she still has a slight deep tissue injury in regard to the medial portion of her foot over the same area. Unfortunately I think this is something that if were not careful it was can open up into the wound. That is my main  concern here based on what I see. Fortunately there does not appear to be any evidence of active infection which is great news and overall very pleased with where things stand at this point. 08/21/2020 upon evaluation today patient appears to be doing well with regard to her wound. She has been tolerating the dressing changes without complication. Fortunately there does not appear to be any signs of active infection at this time. No fevers, chills, nausea, vomiting, or diarrhea. I do believe the compression wrap was beneficial for her. 08/28/2020 upon evaluation today patient appears to be doing well with regard to her wounds currently. Fortunately there does not appear to be any signs of active infection at this time. No fevers, chills, nausea, vomiting, or diarrhea. With that being said I think that her leg is doing quite well to be honest. 09/04/2020 upon evaluation today patient appears to be doing well with regard to her wound on the heel. This is showing signs of good epithelial growth although there is probably can it be an indention where this heals that is okay as long as we get it closed. Fortunately I do not see any evidence of infection at this point. 09/13/2020 upon evaluation today patient appears to be doing well with regard to her wounds. She has been tolerating the dressing changes Jungman, Marybel L. ([id number]) without complication. Fortunately he is actually doing extremely well and I think she is making  great progress this is measuring smaller. I would recommend that such that we continue with the [facility] likely since he is doing so well. 09/18/2020 upon evaluation today patient appears to be doing well with regard to her heel ulcer. She is making good progress and I am very pleased with what we see today. I think the Hydrofera Blue is still doing excellent. 10/02/2020 upon evaluation today patient's wound actually appears to be showing signs of good improvement in regard to the  heel. I am very pleased in this regard. With that being said I do believe that the area which is somewhat been stable and dry is starting to lift up and beginning to clear this away as well that is on the foot. Subsequently I am going to have to perform some debridement here and we did actually go ahead and allow this as a wound today as before it was just more deep tissue injury and eschar covering now I think it is a little bit more than that to be honest. 10/09/2020 upon evaluation today patient appears to be doing decently well in regard to her wounds. I am actually very pleased with where things stand today. There does not appear to be any signs of active infection which is great news. No fevers, chills, nausea, vomiting, or diarrhea. She is going require some sharp debridement today. 10/16/2020 upon evaluation today patient appears to be doing decently well in regard to her heel as well as the foot. Both are showing signs of good improvement which is great news. In general and extremely pleased with where things stand at this point. She is tolerating the [facility] well although this is getting so small in the heel I think collagen may be a better option here based on what I am seeing. With regard to the foot the Iodoflex/Iodosorb is still probably the best method here. 10/26/2020 upon evaluation today patient actually appears to be doing well in some respects today. She has been tolerating the dressing changes without complication and this is great news. The Hydrofera Blue is done well for the heel this actually appears to be healed today. With that being said in regard to the foot I think we are ready to switch to [facility] based on what I see at this point. 10/30/2020 upon evaluation today patient appears to be doing well with regard to his wound. He has been tolerating the dressing changes without complication. Fortunately there does not appear to be any signs of active infection  systemically at this time which is great news and overall very pleased with where things stand today. I do think that she is making progress although it somewhat slow still where showing signs of improvement little by little here. 11/06/2020 upon evaluation today patient appears to be doing decently well in regard to her wound. We will start and see better overall appearance of the base of the wound which is great news she still continues to have some abnormal fluorescence signals with a MolecuLight DX that will be detailed below. 11/20/2020 upon evaluation today patient's wound actually showing signs of improvement. There is good to be some need for sharp debridement today and that was discussed with the patient. I am going to go ahead and clean this up but I do believe the [facility] is doing a great job. 11/27/2020; the patient had eschar over the original heel wound which I removed with a #3 curette there was nothing open here. The area is on  the lateral left foot foot. 12/04/2020 upon evaluation today patient appears to be doing well with regard to her wound. The wound bed actually appeared to be doing well after I remove the dry endoform from the wound bed. This I tracked little bit of fluid but nonetheless underneath this appears to be doing awesome. I am very pleased with where things stand today. No fevers, chills, nausea, vomiting, or diarrhea. 12/11/2020 upon evaluation today patient appears to be doing well with regard to her wound although it keeps getting dry and filled up with the endoform I am not seeing any signs of infection but we are going to have to clean this away in order to try and get things moving in an appropriate direction. I discussed that with patient's daughter today as well. She is in agreement with proceeding as such. 12/21/2020 upon evaluation today patient appears to be doing well with regard to her wound this did not seal off like it was with the collagen  but nonetheless also did not really feeling quite as much as I would like to have seen. I do believe that we may need to go ahead and see about doing a debridement today and I really feel like we may want to switch back to the [facility] which I felt like was doing better in the past. Good news is the base of the wound appears healthy with good granulation tissue. 12/28/2020 upon evaluation patient's wound actually showed signs of doing quite well in regard to her foot. I think that we getting very close to complete resolution and overall I am extremely happy with where things stand today. 01/04/2021 upon evaluation today patient appears to be doing well with regard to her wound on the foot. This is good to require little bit of debridement today but overall seems to be doing quite well. I am actually very pleased with where we stand currently. 01/08/2021 upon evaluation today patient's wound actually showing signs of some improvement although she still has some callus buildup around the edges of the wound unfortunately I think this is continuing to rub despite what we are doing currently. I think that we may need to go ahead and see about switching up the dressing a little bit I Minna recommend collagen and then on top of the collagen we will get a use some foam double layered cut in a doughnut to try to take pressure off of this area we will see how this does over the next week. 01/15/2021 upon evaluation today patient's wound is actually showing signs of being completely dried over with the collagen. This is definitely not what we are looking for. I think that the wound dried out too quickly is the main issue that we are running into at this point and I really think we may need to do something else try to keep that from occurring. 01/22/2021 upon evaluation today patient's wound actually did stay open it did not callus over as its been doing in the past this is good news. Also feel like it may  not be quite as deep as what we have previously seen. There is still not as much improvement he does what I would like to see but nonetheless I think we are headed in the right direction. 01/29/2021 upon evaluation today patient appears to be doing okay in regard to her wound is not too dry but unfortunately is too wet the Xeroform just did not do what I was hoping that she made  things a little bit worse as far as size wise I am actually going to try at this point considering this is more open but not as deep the Hydrofera Blue to see if this will be beneficial at this time. Again we seem to be cycling through things and it is becoming a little difficult to get this to completely close but in the past Hydrofera Blue is what she did the best with 1 week to keep it from closing up prematurely. 02/05/2021 upon evaluation today patient's wound actually showing some signs of improvement which is great news. Fortunately I do not see any evidence of active infection locally nor systemically. It is definitely not too wet which is been the biggest issue we have seen up to this point over the past several weeks. [name] [name], [name] ([id number]) 02/21/2021 upon evaluation today patient appears to be doing excellent in regard to her wound. This actually appears much improved compared to last time I saw her which is great news as well. No fevers, chills, nausea, vomiting, or diarrhea. 02/28/2021 upon evaluation today patient's wound on her foot actually appears to be doing quite well. I am very pleased with where things stand today. I do not see any evidence of active infection locally nor systemically at this time. No fevers, chills, nausea, vomiting, or diarrhea. 03/14/2021 upon evaluation today patient appears to be doing well with regard to her wound. There is a little bit of a limp on the plantar edge of the wound opening which I Minna see about trying to clear away today. Fortunately I think other than this  everything seems to be showing signs of improvement measurements still are roughly the same. It is definitely not as deep however. 03/28/2021 upon evaluation today patient appears to be doing well with regard to her wound. There is still some slough noted in the base of the wound. Fortunately there does not appear to be any evidence of active infection locally nor systemically at this time which is good news. Electronic Signature(s) Signed: 03/28/2021 1:20:25 PM By: Worthy Keeler PA-C Entered By: Worthy Keeler on 03/28/2021 13:20:25 [name] [name], [name] ([id number]) -------------------------------------------------------------------------------- Physical Exam Details Patient Name: Hillmann, Erin L. Date of Service: 03/28/2021 11:30 AM Medical Record Number: [id number] Patient Account Number: [id number] Date of Birth/Sex: 03-24-1926 (88 y.o. F) Treating RN: Donnamarie Poag Primary Care Provider: Lelon [name] Other Clinician: Referring Provider: Lelon [name] Treating Provider/Extender: Skipper Cliche in Treatment: 29 Constitutional Well-nourished and well-hydrated in no acute distress. Respiratory normal breathing without difficulty. Psychiatric this patient is able to make decisions and demonstrates good insight into disease process. Alert and Oriented x 3. pleasant and cooperative. Notes Patient's wound bed showed signs of fairly good granulation and epithelization at this point. There was a minimal amount of slough noted I cleaned this away with saline and gauze no sharp debridement necessary today. Electronic Signature(s) Signed: 03/28/2021 1:20:38 PM By: Worthy Keeler PA-C Entered By: Worthy Keeler on 03/28/2021 13:20:38 [name] [name], [name] ([id number]) -------------------------------------------------------------------------------- Physician Orders Details Patient Name: Machorro, Clemencia L. Date of Service: 03/28/2021 11:30 AM Medical Record Number: [id number] Patient Account Number:  [id number] Date of Birth/Sex: 1926-12-21 (88 y.o. F) Treating RN: Donnamarie Poag Primary Care Provider: Lelon [name] Other Clinician: Referring Provider: Lelon [name] Treating Provider/Extender: Skipper Cliche in Treatment: 15 Verbal / Phone Orders: No Diagnosis Coding ICD-10 Coding Code Description [phone] Pressure ulcer of other site, stage 3 I10 Essential (primary) hypertension F01.50 Vascular dementia without behavioral disturbance  I25.10 Atherosclerotic heart disease of native coronary artery without angina pectoris Follow-up Appointments o Return Appointment in 1 week. o Nurse Visit as needed Bathing/ Shower/ Hygiene o May shower with wound dressing protected with water repellent cover or cast protector. o No tub bath. Anesthetic (Use 'Patient Medications' Section for Anesthetic Order Entry) o Lidocaine applied to wound bed Edema Control - Lymphedema / Segmental Compressive Device / Other o Optional: One layer of unna paste to top of compression wrap (to act as an anchor). o Tubigrip single layer applied. - left leg size D o Patient to wear own compression stockings. Remove compression stockings every night before going to bed and put on every morning when getting up. - right leg o Elevate legs to the level of the heart and pump ankles as often as possible o Elevate leg(s) parallel to the floor when sitting. o DO YOUR BEST to sleep in the bed at night. DO NOT sleep in your recliner. Long hours of sitting in a recliner leads to swelling of the legs and/or potential wounds on your backside. Off-Loading o Other: - Offloading boots while in bed; Try pillow under sheet to keep it in place. Additional Orders / Instructions o Follow Nutritious Diet and Increase Protein Intake Wound Treatment Wound #3 - Foot Wound Laterality: Left Cleanser: Soap and Water 2 x Per Week/30 Days Discharge Instructions: Gently cleanse wound with antibacterial soap, rinse  and pat dry prior to dressing wounds Primary Dressing: Hydrofera Blue Ready Transfer Foam, 2.5x2.5 (in/in) 2 x Per Week/30 Days Discharge Instructions: ONY ON TOP of wound only-Apply Hydrofera Blue Ready to wound bed as directed Secondary Dressing: Zetuvit Plus Silicone Border Dressing 4x4 (in/in) 2 x Per Week/30 Days Secured With: Tubigrip Size D, 3x10 (in/yd) 2 x Per Week/30 Days Electronic Signature(s) Signed: 03/28/2021 3:00:07 PM By: Donnamarie Poag Signed: 03/28/2021 5:14:54 PM By: Worthy Keeler PA-C Entered By: Donnamarie Poag on 03/28/2021 11:52:59 [name] [name], [name] ([id number]) -------------------------------------------------------------------------------- Problem List Details Patient Name: [name] [name], [name] Date of Service: 03/28/2021 11:30 AM Medical Record Number: [id number] Patient Account Number: [id number] Date of Birth/Sex: [birth year] (88 y.o. F) Treating RN: Donnamarie Poag Primary Care Provider: Lelon [name] Other Clinician: Referring Provider: Lelon [name] Treating Provider/Extender: Skipper Cliche in Treatment: 39 Active Problems ICD-10 Encounter Code Description Active Date MDM Diagnosis L89.893 Pressure ulcer of other site, stage 3 10/02/2020 No Yes I10 Essential (primary) hypertension 06/26/2020 No Yes F01.50 Vascular dementia without behavioral disturbance 06/26/2020 No Yes I25.10 Atherosclerotic heart disease of native coronary artery without angina 06/26/2020 No Yes pectoris Inactive Problems Resolved Problems ICD-10 Code Description Active Date Resolved Date L89.623 Pressure ulcer of left heel, stage 3 06/26/2020 06/26/2020 Electronic Signature(s) Signed: 03/28/2021 11:28:33 AM By: Worthy Keeler PA-C Entered By: Worthy Keeler on 03/28/2021 11:28:32 [name] [name], [name] ([id number]) -------------------------------------------------------------------------------- Progress Note Details Patient Name: [name] [name], [name] Date of Service: 03/28/2021 11:30 AM Medical  Record Number: [id number] Patient Account Number: [id number] Date of Birth/Sex: [birth year] (88 y.o. F) Treating RN: Donnamarie Poag Primary Care Provider: Lelon [name] Other Clinician: Referring Provider: Lelon [name] Treating Provider/Extender: Skipper Cliche in Treatment: 39 Subjective Chief Complaint Information obtained from Patient Left Heel Pressure Ulcer History of Present Illness (HPI) 88 year old patient was recently seen by the PCPs office for significant pain right great toe which has been going on since July. She was initially treated with Keflex which she did not complete and after the office visit this time she has been put on doxycycline. at  the time of her visit she was found to have a ulcer on the plantar surface of the right great toe and also had a pyogenic granuloma over this area. X-ray of the right foot done 12/29/2014 -- IMPRESSION:Soft-tissue swelling and ulceration right great toe. No underlying bony lytic lesion identified. If osteomyelitis remains of clinical concern MRI can be obtained. Past medical history significant for anemia, chronic kidney disease stage III, obesity, varicose veins, coronary artery disease, gout, history of nicotine addiction given up smoking in 2002, hypertension, status post cardiac catheterization, status post abdominal hysterectomy, cholecystectomy and tonsillectomy. hemoglobin A1c done in August was 5.8 01/22/2015 -- at this stage the [facility] Walking boat was going to cost them significant amount of money and they want to defer using that at the present time. 01/29/2015 -- she had a podiatry appointment and they have trimmed her toenails. She has not heard back from the vascular office regarding her venous duplex study and I have asked them to call personally so that they can get the appointment soon. 02/06/2015 -- they have made contact with the vascular office and from what I understand that test has been done but the report is  pending. 02/19/2014 -- the vascular test is scheduled for tomorrow Readmission: 06/26/2020 upon evaluation today patient appears for initial evaluation in her clinic that she has been here before in 2016 and has been quite sometime. She did have a fractured hip in February on the 23rd 2022. Subsequently this had to be pinned and she ended up with a pressure injury on her heel following when she was using her foot to help move her around in the bed. Subsequently this has led to the wound that she has been dealing with since that time. She lives at home with her daughter currently she does have dementia. 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 07/03/2020 upon evaluation today patient appears to be doing well with regard to her heel ulcer. She did have arterial studies they appear to be doing excellent it was premature normal across the board with TBI's in the 90s and ABIs well within normal range. Nonetheless there does not appear to be any signs of arterial insufficiency whatsoever. With that being said the patient does have also signs of improvement there is some necrotic tissue in the base of the wound number to try to clear some of that away today I do believe the Iodoflex/Iodosorb is doing well. 5/24; difficult punched out wound on the left medial heel. She has been using Iodoflex 07/17/2020 upon evaluation today patient appears to be doing well at this point in regard to her wound. I do feel like this is a little bit deeper but again that is what is expected as we continue with the Iodoflex I think that this is just going to get deeper until we get to the base of the wound. With that being said I think we are getting much closer to the base of the wound where we can have healthier tissue that we get a be managing here which that will be awesome. In the meantime I  am not surprised by what I am seeing and in fact the wound appears to be better as compared to previous findings. 07/24/2020 upon evaluation today patient appears to be doing well with regard to her wound. Overall I am extremely pleased with where things stand today. I  do not see any signs of active infection which is great and overall I think that the patient is making good progress. There is good to be some need for sharp debridement today. 07/31/2020 upon evaluation today patient appears to be doing better in regard to her heel ulcer. She has been tolerating the dressing changes without complication. Fortunately there does not appear to be any signs of active infection which is great and overall very pleased in this regard. No fevers, chills, nausea, vomiting, or diarrhea. 08/14/2020 upon evaluation today patient appears to be doing better in regard to her heel ulcer. I am very pleased in that regard. Unfortunately she still has a slight deep tissue injury in regard to the medial portion of her foot over the same area. Unfortunately I think this is something that if were not careful it was can open up into the wound. That is my main concern here based on what I see. Fortunately there does not appear to be any evidence of active infection which is great news and overall very pleased with where things stand at this point. 08/21/2020 upon evaluation today patient appears to be doing well with regard to her wound. She has been tolerating the dressing changes without complication. Fortunately there does not appear to be any signs of active infection at this time. No fevers, chills, nausea, vomiting, or diarrhea. I do believe the compression wrap was beneficial for her. 08/28/2020 upon evaluation today patient appears to be doing well with regard to her wounds currently. Fortunately there does not appear to be any signs of active infection at this time. No fevers, chills, nausea, vomiting, or diarrhea. With  that being said I think that her leg is doing quite well to be honest. [name] [name], [name] ([id number]) 09/04/2020 upon evaluation today patient appears to be doing well with regard to her wound on the heel. This is showing signs of good epithelial growth although there is probably can it be an indention where this heals that is okay as long as we get it closed. Fortunately I do not see any evidence of infection at this point. 09/13/2020 upon evaluation today patient appears to be doing well with regard to her wounds. She has been tolerating the dressing changes without complication. Fortunately he is actually doing extremely well and I think she is making great progress this is measuring smaller. I would recommend that such that we continue with the [facility] likely since he is doing so well. 09/18/2020 upon evaluation today patient appears to be doing well with regard to her heel ulcer. She is making good progress and I am very pleased with what we see today. I think the Hydrofera Blue is still doing excellent. 10/02/2020 upon evaluation today patient's wound actually appears to be showing signs of good improvement in regard to the heel. I am very pleased in this regard. With that being said I do believe that the area which is somewhat been stable and dry is starting to lift up and beginning to clear this away as well that is on the foot. Subsequently I am going to have to perform some debridement here and we did actually go ahead and allow this as a wound today as before it was just more deep tissue injury and eschar covering now I think it is a little bit more than that to be honest. 10/09/2020 upon evaluation today patient appears to be doing decently well in regard to her wounds. I am actually very pleased with  where things stand today. There does not appear to be any signs of active infection which is great news. No fevers, chills, nausea, vomiting, or diarrhea. She is going require some sharp  debridement today. 10/16/2020 upon evaluation today patient appears to be doing decently well in regard to her heel as well as the foot. Both are showing signs of good improvement which is great news. In general and extremely pleased with where things stand at this point. She is tolerating the [facility] well although this is getting so small in the heel I think collagen may be a better option here based on what I am seeing. With regard to the foot the Iodoflex/Iodosorb is still probably the best method here. 10/26/2020 upon evaluation today patient actually appears to be doing well in some respects today. She has been tolerating the dressing changes without complication and this is great news. The Hydrofera Blue is done well for the heel this actually appears to be healed today. With that being said in regard to the foot I think we are ready to switch to [facility] based on what I see at this point. 10/30/2020 upon evaluation today patient appears to be doing well with regard to his wound. He has been tolerating the dressing changes without complication. Fortunately there does not appear to be any signs of active infection systemically at this time which is great news and overall very pleased with where things stand today. I do think that she is making progress although it somewhat slow still where showing signs of improvement little by little here. 11/06/2020 upon evaluation today patient appears to be doing decently well in regard to her wound. We will start and see better overall appearance of the base of the wound which is great news she still continues to have some abnormal fluorescence signals with a MolecuLight DX that will be detailed below. 11/20/2020 upon evaluation today patient's wound actually showing signs of improvement. There is good to be some need for sharp debridement today and that was discussed with the patient. I am going to go ahead and clean this up but I do believe the  [facility] is doing a great job. 11/27/2020; the patient had eschar over the original heel wound which I removed with a #3 curette there was nothing open here. The area is on the lateral left foot foot. 12/04/2020 upon evaluation today patient appears to be doing well with regard to her wound. The wound bed actually appeared to be doing well after I remove the dry endoform from the wound bed. This I tracked little bit of fluid but nonetheless underneath this appears to be doing awesome. I am very pleased with where things stand today. No fevers, chills, nausea, vomiting, or diarrhea. 12/11/2020 upon evaluation today patient appears to be doing well with regard to her wound although it keeps getting dry and filled up with the endoform I am not seeing any signs of infection but we are going to have to clean this away in order to try and get things moving in an appropriate direction. I discussed that with patient's daughter today as well. She is in agreement with proceeding as such. 12/21/2020 upon evaluation today patient appears to be doing well with regard to her wound this did not seal off like it was with the collagen but nonetheless also did not really feeling quite as much as I would like to have seen. I do believe that we may need to go ahead and see  about doing a debridement today and I really feel like we may want to switch back to the [facility] which I felt like was doing better in the past. Good news is the base of the wound appears healthy with good granulation tissue. 12/28/2020 upon evaluation patient's wound actually showed signs of doing quite well in regard to her foot. I think that we getting very close to complete resolution and overall I am extremely happy with where things stand today. 01/04/2021 upon evaluation today patient appears to be doing well with regard to her wound on the foot. This is good to require little bit of debridement today but overall seems to be doing  quite well. I am actually very pleased with where we stand currently. 01/08/2021 upon evaluation today patient's wound actually showing signs of some improvement although she still has some callus buildup around the edges of the wound unfortunately I think this is continuing to rub despite what we are doing currently. I think that we may need to go ahead and see about switching up the dressing a little bit I Minna recommend collagen and then on top of the collagen we will get a use some foam double layered cut in a doughnut to try to take pressure off of this area we will see how this does over the next week. 01/15/2021 upon evaluation today patient's wound is actually showing signs of being completely dried over with the collagen. This is definitely not what we are looking for. I think that the wound dried out too quickly is the main issue that we are running into at this point and I really think we may need to do something else try to keep that from occurring. 01/22/2021 upon evaluation today patient's wound actually did stay open it did not callus over as its been doing in the past this is good news. Also feel like it may not be quite as deep as what we have previously seen. There is still not as much improvement he does what I would like to see but nonetheless I think we are headed in the right direction. 01/29/2021 upon evaluation today patient appears to be doing okay in regard to her wound is not too dry but unfortunately is too wet the Xeroform just did not do what I was hoping that she made things a little bit worse as far as size wise I am actually going to try at this point considering this is more open but not as deep the Hydrofera Blue to see if this will be beneficial at this time. Again we seem to be cycling through things and it is becoming a little difficult to get this to completely close but in the past Hydrofera Blue is what she did the best with 1 week to keep [name] [name], [name]  ([id number]) it from closing up prematurely. 02/05/2021 upon evaluation today patient's wound actually showing some signs of improvement which is great news. Fortunately I do not see any evidence of active infection locally nor systemically. It is definitely not too wet which is been the biggest issue we have seen up to this point over the past several weeks. 02/21/2021 upon evaluation today patient appears to be doing excellent in regard to her wound. This actually appears much improved compared to last time I saw her which is great news as well. No fevers, chills, nausea, vomiting, or diarrhea. 02/28/2021 upon evaluation today patient's wound on her foot actually appears to be doing quite well. I am  very pleased with where things stand today. I do not see any evidence of active infection locally nor systemically at this time. No fevers, chills, nausea, vomiting, or diarrhea. 03/14/2021 upon evaluation today patient appears to be doing well with regard to her wound. There is a little bit of a limp on the plantar edge of the wound opening which I Minna see about trying to clear away today. Fortunately I think other than this everything seems to be showing signs of improvement measurements still are roughly the same. It is definitely not as deep however. 03/28/2021 upon evaluation today patient appears to be doing well with regard to her wound. There is still some slough noted in the base of the wound. Fortunately there does not appear to be any evidence of active infection locally nor systemically at this time which is good news. Objective Constitutional Well-nourished and well-hydrated in no acute distress. Vitals Time Taken: 11:38 AM, Height: 58 in, Weight: 137 lbs, BMI: 28.6, Temperature: 98.2 F, Pulse: 87 bpm, Respiratory Rate: 16 breaths/min, Blood Pressure: 133/76 mmHg. Respiratory normal breathing without difficulty. Psychiatric this patient is able to make decisions and demonstrates good  insight into disease process. Alert and Oriented x 3. pleasant and cooperative. General Notes: Patient's wound bed showed signs of fairly good granulation and epithelization at this point. There was a minimal amount of slough noted I cleaned this away with saline and gauze no sharp debridement necessary today. Integumentary (Hair, Skin) Wound #3 status is Open. Original cause of wound was Pressure Injury. The date acquired was: 10/02/2020. The wound has been in treatment 25 weeks. The wound is located on the Left Foot. The wound measures 0.2cm length x 0.3cm width x 0.2cm depth; 0.047cm^2 area and 0.009cm^3 volume. There is Fat Layer (Subcutaneous Tissue) exposed. There is no tunneling or undermining noted. There is a medium amount of serous drainage noted. The wound margin is thickened. There is large (67-100%) red, pink granulation within the wound bed. There is a small (1-33%) amount of necrotic tissue within the wound bed including Adherent Slough. Assessment Active Problems ICD-10 Pressure ulcer of other site, stage 3 Essential (primary) hypertension Vascular dementia without behavioral disturbance Atherosclerotic heart disease of native coronary artery without angina pectoris Plan Follow-up Appointments: Return Appointment in 1 week. [name] [name], [name] ([id number]) Nurse Visit as needed Bathing/ Shower/ Hygiene: May shower with wound dressing protected with water repellent cover or cast protector. No tub bath. Anesthetic (Use 'Patient Medications' Section for Anesthetic Order Entry): Lidocaine applied to wound bed Edema Control - Lymphedema / Segmental Compressive Device / Other: Optional: One layer of unna paste to top of compression wrap (to act as an anchor). Tubigrip single layer applied. - left leg size D Patient to wear own compression stockings. Remove compression stockings every night before going to bed and put on every morning when getting up. - right leg Elevate legs to  the level of the heart and pump ankles as often as possible Elevate leg(s) parallel to the floor when sitting. DO YOUR BEST to sleep in the bed at night. DO NOT sleep in your recliner. Long hours of sitting in a recliner leads to swelling of the legs and/or potential wounds on your backside. Off-Loading: Other: - Offloading boots while in bed; Try pillow under sheet to keep it in place. Additional Orders / Instructions: Follow Nutritious Diet and Increase Protein Intake WOUND #3: - Foot Wound Laterality: Left Cleanser: Soap and Water 2 x Per Week/30 Days Discharge Instructions: Gently  cleanse wound with antibacterial soap, rinse and pat dry prior to dressing wounds Primary Dressing: Hydrofera Blue Ready Transfer Foam, 2.5x2.5 (in/in) 2 x Per Week/30 Days Discharge Instructions: ONY ON TOP of wound only-Apply Hydrofera Blue Ready to wound bed as directed Secondary Dressing: Zetuvit Plus Silicone Border Dressing 4x4 (in/in) 2 x Per Week/30 Days Secured With: Tubigrip Size D, 3x10 (in/yd) 2 x Per Week/30 Days 1. Would recommend currently that we go to continue with the wound care measures as before and the patient is in agreement with plan. This includes the use of the [facility] which I think is doing a good job. 2. Also can recommend that we have the patient continue with the Zetuvit border foam dressing. 3. We will also continue with the Tubigrip size D to cover. We will see patient back for reevaluation in 1 week here in the clinic. If anything worsens or changes patient will contact our office for additional recommendations. Electronic Signature(s) Signed: 03/28/2021 1:22:08 PM By: Worthy Keeler PA-C Entered By: Worthy Keeler on 03/28/2021 13:22:08 [name] [name], Cressie L. ([id number]) -------------------------------------------------------------------------------- SuperBill Details Patient Name: Rhatigan, Sowmya L. Date of Service: 03/28/2021 Medical Record Number: [id number] Patient  Account Number: [id number] Date of Birth/Sex: 1926/11/09 (88 y.o. F) Treating RN: Donnamarie Poag Primary Care Provider: Lelon [name] Other Clinician: Referring Provider: Lelon [name] Treating Provider/Extender: Skipper Cliche in Treatment: 39 Diagnosis Coding ICD-10 Codes Code Description [phone] Pressure ulcer of other site, stage 3 I10 Essential (primary) hypertension I25.10 Atherosclerotic heart disease of native coronary artery without angina pectoris Vascular dementia, unspecified severity, without behavioral disturbance, psychotic disturbance, mood disturbance, and F01.50 anxiety Facility Procedures CPT4 Code: [id number] Description: [phone] - WOUND CARE VISIT-LEV 2 EST PT Modifier: Quantity: 1 Physician Procedures CPT4: Description Modifier Quantity Code [id number] - WC PHYS LEVEL 3 - EST PT 1 CPT4: ICD-10 Diagnosis Description L89.893 Pressure ulcer of other site, stage 3 I10 Essential (primary) hypertension I25.10 Atherosclerotic heart disease of native coronary artery without angina pectoris F01.50 Vascular dementia, unspecified severity,  without behavioral disturbance, psychotic disturbance, mood disturbance, and anxiety Electronic Signature(s) Signed: 03/28/2021 1:22:26 PM By: Worthy Keeler PA-C Entered By: Worthy Keeler on 03/28/2021 13:22:26

## 2021-04-04 ENCOUNTER — Telehealth: Payer: Self-pay | Admitting: Family Medicine

## 2021-04-04 ENCOUNTER — Other Ambulatory Visit: Payer: Self-pay

## 2021-04-04 DIAGNOSIS — L89893 Pressure ulcer of other site, stage 3: Secondary | ICD-10-CM | POA: Diagnosis not present

## 2021-04-04 NOTE — Progress Notes (Signed)
Minihan, Lakera L. ([id number]) Visit Report for 04/04/2021 Arrival Information Details Patient Name: [name] [name], [name] Date of Service: 04/04/2021 11:30 AM Medical Record Number: [id number] Patient Account Number: [id number] Date of Birth/Sex: 1926-07-03 (88 y.o. F) Treating RN: Donnamarie Poag Primary Care Estanislao Harmon: [name] Other Clinician: Referring Rhilee Currin: [name] Treating Ottilia Pippenger/Extender: Skipper Cliche in Treatment: 61 Visit Information History Since Last Visit Added or deleted any medications: No Patient Arrived: Wheel Chair Had a fall or experienced change in No Arrival Time: 11:15 activities of daily living that may affect Accompanied By: daughter risk of falls: Transfer Assistance: EasyPivot Patient Lift Hospitalized since last visit: No Patient Identification Verified: Yes Has Dressing in Place as Prescribed: Yes Secondary Verification Process Completed: Yes Pain Present Now: No Patient Requires Transmission-Based No Precautions: Patient Has Alerts: Yes Patient Alerts: NOT diabetic ABI R1.18 L1.25 06/27/20 Electronic Signature(s) Signed: 04/04/2021 3:56:17 PM By: Donnamarie Poag Entered ByDonnamarie Poag on 04/04/2021 11:17:36 Mcglamery, Breezie L. ([id number]) -------------------------------------------------------------------------------- Clinic Level of Care Assessment Details Patient Name: [name] [name], [name] Date of Service: 04/04/2021 11:30 AM Medical Record Number: [id number] Patient Account Number: [id number] Date of Birth/Sex: May 30, 1926 (88 y.o. F) Treating RN: Donnamarie Poag Primary Care Karrington Mccravy: [name] Other Clinician: Referring Treyvone Chelf: [name] Treating Raneen Jaffer/Extender: Skipper Cliche in Treatment: 40 Clinic Level of Care Assessment Items TOOL 4 Quantity Score []  - Use when only an EandM is performed on FOLLOW-UP visit 0 ASSESSMENTS - Nursing Assessment / Reassessment []  - Reassessment of Co-morbidities (includes updates in  patient status) 0 []  - 0 Reassessment of Adherence to Treatment Plan ASSESSMENTS - Wound and Skin Assessment / Reassessment X - Simple Wound Assessment / Reassessment - one wound 1 5 []  - 0 Complex Wound Assessment / Reassessment - multiple wounds []  - 0 Dermatologic / Skin Assessment (not related to wound area) ASSESSMENTS - Focused Assessment []  - Circumferential Edema Measurements - multi extremities 0 []  - 0 Nutritional Assessment / Counseling / Intervention []  - 0 Lower Extremity Assessment (monofilament, tuning fork, pulses) []  - 0 Peripheral Arterial Disease Assessment (using hand held doppler) ASSESSMENTS - Ostomy and/or Continence Assessment and Care []  - Incontinence Assessment and Management 0 []  - 0 Ostomy Care Assessment and Management (repouching, etc.) PROCESS - Coordination of Care X - Simple Patient / Family Education for ongoing care 1 15 []  - 0 Complex (extensive) Patient / Family Education for ongoing care []  - 0 Staff obtains Programmer, systems, Records, Test Results / Process Orders []  - 0 Staff telephones HHA, Nursing Homes / Clarify orders / etc []  - 0 Routine Transfer to another Facility (non-emergent condition) []  - 0 Routine Hospital Admission (non-emergent condition) []  - 0 New Admissions / Biomedical engineer / Ordering NPWT, Apligraf, etc. []  - 0 Emergency Hospital Admission (emergent condition) X- 1 10 Simple Discharge Coordination []  - 0 Complex (extensive) Discharge Coordination PROCESS - Special Needs []  - Pediatric / Minor Patient Management 0 []  - 0 Isolation Patient Management []  - 0 Hearing / Language / Visual special needs []  - 0 Assessment of Community assistance (transportation, D/C planning, etc.) []  - 0 Additional assistance / Altered mentation []  - 0 Support Surface(s) Assessment (bed, cushion, seat, etc.) INTERVENTIONS - Wound Cleansing / Measurement [name] [name], [name] ([id number]) X- 1 5 Simple Wound Cleansing - one  wound []  - 0 Complex Wound Cleansing - multiple wounds []  - 0 Wound Imaging (photographs - any number of wounds) []  - 0 Wound Tracing (instead of photographs) X- 1 5 Simple Wound  Measurement - one wound []  - 0 Complex Wound Measurement - multiple wounds INTERVENTIONS - Wound Dressings X - Small Wound Dressing one or multiple wounds 1 10 []  - 0 Medium Wound Dressing one or multiple wounds []  - 0 Large Wound Dressing one or multiple wounds []  - 0 Application of Medications - topical []  - 0 Application of Medications - injection INTERVENTIONS - Miscellaneous []  - External ear exam 0 []  - 0 Specimen Collection (cultures, biopsies, blood, body fluids, etc.) []  - 0 Specimen(s) / Culture(s) sent or taken to Lab for analysis []  - 0 Patient Transfer (multiple staff / Civil Service fast streamer / Similar devices) []  - 0 Simple Staple / Suture removal (25 or less) []  - 0 Complex Staple / Suture removal (26 or more) []  - 0 Hypo / Hyperglycemic Management (close monitor of Blood Glucose) []  - 0 Ankle / Brachial Index (ABI) - do not check if billed separately []  - 0 Vital Signs Has the patient been seen at the hospital within the last three years: Yes Total Score: 50 Level Of Care: New/Established - Level 2 Electronic Signature(s) Signed: 04/04/2021 3:56:17 PM By: Donnamarie Poag Entered ByDonnamarie Poag on 04/04/2021 11:28:24 [name] [name], [name] ([id number]) -------------------------------------------------------------------------------- Encounter Discharge Information Details Patient Name: [name] [name], [name] Date of Service: 04/04/2021 11:30 AM Medical Record Number: [id number] Patient Account Number: [id number] Date of Birth/Sex: 05-14-1926 (88 y.o. F) Treating RN: Donnamarie Poag Primary Care Dalis Beers: [name] Other Clinician: Referring Merica Prell: [name] Treating Nikko Quast/Extender: Skipper Cliche in Treatment: 47 Encounter Discharge Information Items Discharge Condition:  Stable Ambulatory Status: Wheelchair Discharge Destination: Home Transportation: Private Auto Accompanied By: daughter Schedule Follow-up Appointment: Yes Clinical Summary of Care: Electronic Signature(s) Signed: 04/04/2021 3:56:17 PM By: Donnamarie Poag Entered By: Donnamarie Poag on 04/04/2021 11:28:04 [name] [name], [name] ([id number]) -------------------------------------------------------------------------------- Wound Assessment Details Patient Name: [name] [name], [name] Date of Service: 04/04/2021 11:30 AM Medical Record Number: [id number] Patient Account Number: [id number] Date of Birth/Sex: November 01, 1926 (88 y.o. F) Treating RN: Donnamarie Poag Primary Care Adeel Guiffre: [name] Other Clinician: Referring Masiyah Engen: [name] Treating Zian Mohamed/Extender: Skipper Cliche in Treatment: 40 Wound Status Wound Number: 3 Primary Pressure Ulcer Etiology: Wound Location: Left Foot Wound Open Wounding Event: Pressure Injury Status: Date Acquired: 10/02/2020 Comorbid Cataracts, Anemia, Coronary Artery Disease, Weeks Of Treatment: 26 History: Hypertension, Myocardial Infarction, End Stage Renal Clustered Wound: No Disease, Gout, Osteoarthritis, Dementia Wound Measurements Length: (cm) 0.2 Width: (cm) 0.3 Depth: (cm) 0.2 Area: (cm) 0.047 Volume: (cm) 0.009 % Reduction in Area: 85% % Reduction in Volume: 71% Epithelialization: None Wound Description Classification: Category/Stage III Wound Margin: Thickened Exudate Amount: Medium Exudate Type: Serous Exudate Color: amber Foul Odor After Cleansing: No Slough/Fibrino Yes Wound Bed Granulation Amount: Large (67-100%) Exposed Structure Granulation Quality: Red, Pink Fascia Exposed: No Necrotic Amount: Small (1-33%) Fat Layer (Subcutaneous Tissue) Exposed: Yes Necrotic Quality: Adherent Slough Tendon Exposed: No Muscle Exposed: No Joint Exposed: No Bone Exposed: No Treatment Notes Wound #3 (Foot) Wound Laterality:  Left Cleanser Soap and Water Discharge Instruction: Gently cleanse wound with antibacterial soap, rinse and pat dry prior to dressing wounds Peri-Wound Care Topical Primary Dressing Hydrofera Blue Ready Transfer Foam, 2.5x2.5 (in/in) Discharge Instruction: ONY ON TOP of wound only-Apply Hydrofera Blue Ready to wound bed as directed Secondary Dressing Zetuvit Plus Silicone Border Dressing 4x4 (in/in) Secured With Tubigrip Size D, 3x10 (in/yd) Compression Wrap Compression Stockings [name] [name], [name] ([id number]) Add-Ons Electronic Signature(s) Signed: 04/04/2021 3:56:17 PM By: Donnamarie Poag Entered By: Donnamarie Poag on  04/04/2021 11:26:41 °

## 2021-04-04 NOTE — Telephone Encounter (Signed)
Copied from [place] [phone]. Topic: Medicare AWV >> Apr 04, 2021  2:38 PM Cher Nakai R wrote: Reason for CRM:  Left message for patient to call back and schedule Medicare Annual Wellness Visit (AWV) in office.   If not able to come in office, please offer to do virtually or by telephone.   Last AWV:  08/09/2019  Please schedule at anytime with [facility] Health Advisor.  If any questions, please contact me at [phone]

## 2021-04-04 NOTE — Progress Notes (Signed)
[name] [name], Oaklynn L. ([id number]) Visit Report for 04/04/2021 Physician Orders Details Patient Name: [name], [name] Date of Service: 04/04/2021 11:30 AM Medical Record Number: [id number] Patient Account Number: [id number] Date of Birth/Sex: 04-17-1926 (88 y.o. F) Treating RN: Donnamarie Poag Primary Care Provider: [name] Other Clinician: Referring Provider: [name] Treating Provider/Extender: Skipper Cliche in Treatment: 59 Verbal / Phone Orders: No Diagnosis Coding Follow-up Appointments o Return Appointment in 1 week. o Nurse Visit as needed Bathing/ Shower/ Hygiene o May shower with wound dressing protected with water repellent cover or cast protector. o No tub bath. Anesthetic (Use 'Patient Medications' Section for Anesthetic Order Entry) o Lidocaine applied to wound bed Edema Control - Lymphedema / Segmental Compressive Device / Other o Optional: One layer of unna paste to top of compression wrap (to act as an anchor). o Tubigrip single layer applied. - left leg size D o Patient to wear own compression stockings. Remove compression stockings every night before going to bed and put on every morning when getting up. - right leg o Elevate legs to the level of the heart and pump ankles as often as possible o Elevate leg(s) parallel to the floor when sitting. o DO YOUR BEST to sleep in the bed at night. DO NOT sleep in your recliner. Long hours of sitting in a recliner leads to swelling of the legs and/or potential wounds on your backside. Off-Loading o Other: - Offloading boots while in bed; Try pillow under sheet to keep it in place. Additional Orders / Instructions o Follow Nutritious Diet and Increase Protein Intake Wound Treatment Wound #3 - Foot Wound Laterality: Left Cleanser: Soap and Water 2 x Per Week/30 Days Discharge Instructions: Gently cleanse wound with antibacterial soap, rinse and pat dry prior to dressing wounds Primary  Dressing: Hydrofera Blue Ready Transfer Foam, 2.5x2.5 (in/in) 2 x Per Week/30 Days Discharge Instructions: ONY ON TOP of wound only-Apply Hydrofera Blue Ready to wound bed as directed Secondary Dressing: Zetuvit Plus Silicone Border Dressing 4x4 (in/in) 2 x Per Week/30 Days Secured With: Tubigrip Size D, 3x10 (in/yd) 2 x Per Week/30 Days Electronic Signature(s) Signed: 04/04/2021 2:30:26 PM By: Worthy Keeler PA-C Signed: 04/04/2021 3:56:17 PM By: Donnamarie Poag Entered By: Donnamarie Poag on 04/04/2021 11:27:37 Dewing, Neema L. ([id number]) -------------------------------------------------------------------------------- SuperBill Details Patient Name: [name], [name] Date of Service: 04/04/2021 Medical Record Number: [id number] Patient Account Number: [id number] Date of Birth/Sex: 1926/09/30 (88 y.o. F) Treating RN: Donnamarie Poag Primary Care Provider: [name] Other Clinician: Referring Provider: [name] Treating Provider/Extender: Skipper Cliche in Treatment: 40 Diagnosis Coding ICD-10 Codes Code Description [id number] Pressure ulcer of other site, stage 3 I10 Essential (primary) hypertension I25.10 Atherosclerotic heart disease of native coronary artery without angina pectoris Vascular dementia, unspecified severity, without behavioral disturbance, psychotic disturbance, mood disturbance, and F01.50 anxiety Facility Procedures CPT4 Code: [id number] Description: [phone] - WOUND CARE VISIT-LEV 2 EST PT Modifier: Quantity: 1 Electronic Signature(s) Signed: 04/04/2021 2:30:26 PM By: Worthy Keeler PA-C Signed: 04/04/2021 3:56:17 PM By: Donnamarie Poag Entered ByDonnamarie Poag on 04/04/2021 11:28:29

## 2021-04-11 ENCOUNTER — Other Ambulatory Visit: Payer: Self-pay

## 2021-04-11 ENCOUNTER — Encounter: Payer: PPO | Admitting: Physician Assistant

## 2021-04-11 DIAGNOSIS — L89893 Pressure ulcer of other site, stage 3: Secondary | ICD-10-CM | POA: Diagnosis not present

## 2021-04-11 DIAGNOSIS — L97522 Non-pressure chronic ulcer of other part of left foot with fat layer exposed: Secondary | ICD-10-CM | POA: Diagnosis not present

## 2021-04-11 DIAGNOSIS — L89620 Pressure ulcer of left heel, unstageable: Secondary | ICD-10-CM | POA: Diagnosis not present

## 2021-04-11 NOTE — Progress Notes (Signed)
Rhee, Tomeshia L. ([id number]) Visit Report for 04/11/2021 Arrival Information Details Patient Name: [name] [name], [name] Date of Service: 04/11/2021 11:15 AM Medical Record Number: [id number] Patient Account Number: [id number] Date of Birth/Sex: 02/29/1926 (88 y.o. F) Treating RN: Levora Dredge Primary Care Adiya Selmer: [name] Other Clinician: Referring Cornella [name]: [name] Treating Janiqua Friscia/Extender: Skipper Cliche in Treatment: 54 Visit Information History Since Last Visit Added or deleted any medications: No Patient Arrived: Wheel Chair Any new allergies or adverse reactions: No Arrival Time: 11:22 Had a fall or experienced change in No Accompanied By: daughter activities of daily living that may affect Transfer Assistance: EasyPivot Patient Lift risk of falls: Patient Identification Verified: Yes Hospitalized since last visit: No Secondary Verification Process Completed: Yes Has Dressing in Place as Prescribed: Yes Patient Requires Transmission-Based No Pain Present Now: No Precautions: Patient Has Alerts: Yes Patient Alerts: NOT diabetic ABI R1.18 L1.25 06/27/20 Electronic Signature(s) Signed: 04/11/2021 4:04:42 PM By: Levora Dredge Entered By: Levora Dredge on 04/11/2021 11:25:08 [name] [name], [name] ([id number]) -------------------------------------------------------------------------------- Clinic Level of Care Assessment Details Patient Name: [name], [name] Date of Service: 04/11/2021 11:15 AM Medical Record Number: [id number] Patient Account Number: [id number] Date of Birth/Sex: 1927-02-17 (88 y.o. F) Treating RN: Levora Dredge Primary Care Zakariya Knickerbocker: [name] Other Clinician: Referring Alexx Giambra: [name] Treating Ranell Skibinski/Extender: Skipper Cliche in Treatment: 41 Clinic Level of Care Assessment Items TOOL 4 Quantity Score []  - Use when only an EandM is performed on FOLLOW-UP visit 0 ASSESSMENTS - Nursing Assessment /  Reassessment []  - Reassessment of Co-morbidities (includes updates in patient status) 0 X- 1 5 Reassessment of Adherence to Treatment Plan ASSESSMENTS - Wound and Skin Assessment / Reassessment X - Simple Wound Assessment / Reassessment - one wound 1 5 []  - 0 Complex Wound Assessment / Reassessment - multiple wounds []  - 0 Dermatologic / Skin Assessment (not related to wound area) ASSESSMENTS - Focused Assessment []  - Circumferential Edema Measurements - multi extremities 0 []  - 0 Nutritional Assessment / Counseling / Intervention []  - 0 Lower Extremity Assessment (monofilament, tuning fork, pulses) []  - 0 Peripheral Arterial Disease Assessment (using hand held doppler) ASSESSMENTS - Ostomy and/or Continence Assessment and Care []  - Incontinence Assessment and Management 0 []  - 0 Ostomy Care Assessment and Management (repouching, etc.) PROCESS - Coordination of Care X - Simple Patient / Family Education for ongoing care 1 15 []  - 0 Complex (extensive) Patient / Family Education for ongoing care []  - 0 Staff obtains Programmer, systems, Records, Test Results / Process Orders []  - 0 Staff telephones HHA, Nursing Homes / Clarify orders / etc []  - 0 Routine Transfer to another Facility (non-emergent condition) []  - 0 Routine Hospital Admission (non-emergent condition) []  - 0 New Admissions / Biomedical engineer / Ordering NPWT, Apligraf, etc. []  - 0 Emergency Hospital Admission (emergent condition) X- 1 10 Simple Discharge Coordination []  - 0 Complex (extensive) Discharge Coordination PROCESS - Special Needs []  - Pediatric / Minor Patient Management 0 []  - 0 Isolation Patient Management []  - 0 Hearing / Language / Visual special needs []  - 0 Assessment of Community assistance (transportation, D/C planning, etc.) []  - 0 Additional assistance / Altered mentation []  - 0 Support Surface(s) Assessment (bed, cushion, seat, etc.) INTERVENTIONS - Wound Cleansing /  Measurement [name], [name] ([id number]) X- 1 5 Simple Wound Cleansing - one wound []  - 0 Complex Wound Cleansing - multiple wounds X- 1 5 Wound Imaging (photographs - any number of wounds) []  - 0 Wound Tracing (instead  of photographs) X- 1 5 Simple Wound Measurement - one wound []  - 0 Complex Wound Measurement - multiple wounds INTERVENTIONS - Wound Dressings X - Small Wound Dressing one or multiple wounds 1 10 []  - 0 Medium Wound Dressing one or multiple wounds []  - 0 Large Wound Dressing one or multiple wounds X- 1 5 Application of Medications - topical []  - 0 Application of Medications - injection INTERVENTIONS - Miscellaneous []  - External ear exam 0 []  - 0 Specimen Collection (cultures, biopsies, blood, body fluids, etc.) []  - 0 Specimen(s) / Culture(s) sent or taken to Lab for analysis []  - 0 Patient Transfer (multiple staff / Civil Service fast streamer / Similar devices) []  - 0 Simple Staple / Suture removal (25 or less) []  - 0 Complex Staple / Suture removal (26 or more) []  - 0 Hypo / Hyperglycemic Management (close monitor of Blood Glucose) []  - 0 Ankle / Brachial Index (ABI) - do not check if billed separately X- 1 5 Vital Signs Has the patient been seen at the hospital within the last three years: Yes Total Score: 70 Level Of Care: New/Established - Level 2 Electronic Signature(s) Signed: 04/11/2021 4:04:42 PM By: Levora Dredge Entered By: Levora Dredge on 04/11/2021 11:57:34 [name] [name], [name] ([id number]) -------------------------------------------------------------------------------- Encounter Discharge Information Details Patient Name: [name], [name] Date of Service: 04/11/2021 11:15 AM Medical Record Number: [id number] Patient Account Number: [id number] Date of Birth/Sex: [birth year] (88 y.o. F) Treating RN: Levora Dredge Primary Care [place] Baldwin: [name] Other Clinician: Referring Damarien Nyman: [name] Treating Fleming Prill/Extender: Skipper Cliche in Treatment: 60 Encounter Discharge Information Items Discharge Condition: Stable Ambulatory Status: Wheelchair Discharge Destination: Home Transportation: Private Auto Accompanied By: daughter Schedule Follow-up Appointment: Yes Clinical Summary of Care: Electronic Signature(s) Signed: 04/11/2021 4:04:42 PM By: Levora Dredge Entered By: Levora Dredge on 04/11/2021 11:58:38 [name] [name], [name] ([id number]) -------------------------------------------------------------------------------- Lower Extremity Assessment Details Patient Name: [name] [name], [name] Date of Service: 04/11/2021 11:15 AM Medical Record Number: [id number] Patient Account Number: [id number] Date of Birth/Sex: 08/02/1926 (88 y.o. F) Treating RN: Levora Dredge Primary Care Kelita Wallis: [name] Other Clinician: Referring Mieczyslaw Stamas: [name] Treating Luchiano Viscomi/Extender: Skipper Cliche in Treatment: [phone] Vascular Assessment Pulses: Dorsalis Pedis Palpable: [Left:Yes] Posterior Tibial Palpable: [Left:Yes] Electronic Signature(s) Signed: 04/11/2021 4:04:42 PM By: Levora Dredge Entered By: Levora Dredge on 04/11/2021 11:32:10 [place], [place] ([id number]) -------------------------------------------------------------------------------- Multi Wound Chart Details Patient Name: Hersman, Kaytee L. Date of Service: 04/11/2021 11:15 AM Medical Record Number: [id number] Patient Account Number: [id number] Date of Birth/Sex: 28-Jun-1926 (88 y.o. F) Treating RN: Levora Dredge Primary Care Dilon Lank: [name] Other Clinician: Referring Joie Reamer: [name] Treating Illianna Paschal/Extender: Skipper Cliche in Treatment: 41 Vital Signs Height(in): 58 Pulse(bpm): 34 Weight(lbs): 137 Blood Pressure(mmHg): 110/60 Body Mass Index(BMI): 28.6 Temperature(F): 97.5 Respiratory Rate(breaths/min): 18 Photos: [N/A:N/A] Wound Location: Left Foot N/A N/A Wounding Event: Pressure Injury N/A  N/A Primary Etiology: Pressure Ulcer N/A N/A Comorbid History: Cataracts, Anemia, Coronary Artery N/A N/A Disease, Hypertension, Myocardial Infarction, End Stage Renal Disease, Gout, Osteoarthritis, Dementia Date Acquired: 10/02/2020 N/A N/A Weeks of Treatment: 27 N/A N/A Wound Status: Open N/A N/A Wound Recurrence: No N/A N/A Measurements L x W x D (cm) 0.2x0.3x0.2 N/A N/A Area (cm) : 0.047 N/A N/A Volume (cm) : 0.009 N/A N/A % Reduction in Area: 85.00% N/A N/A % Reduction in Volume: 71.00% N/A N/A Classification: Category/Stage III N/A N/A Exudate Amount: Medium N/A N/A Exudate Type: Serous N/A N/A Exudate Color: amber N/A N/A Wound Margin: Thickened N/A N/A Granulation Amount:  Large (67-100%) N/A N/A Granulation Quality: Pale N/A N/A Necrotic Amount: Small (1-33%) N/A N/A Exposed Structures: Fat Layer (Subcutaneous Tissue): N/A N/A Yes Fascia: No Tendon: No Muscle: No Joint: No Bone: No Epithelialization: Small (1-33%) N/A N/A Treatment Notes Electronic Signature(s) Signed: 04/11/2021 4:04:42 PM By: Levora Dredge Entered By: Levora Dredge on 04/11/2021 11:49:35 [name] [name], [name] ([id number]) [name] [name], [name] ([id number]) -------------------------------------------------------------------------------- [place] Details Patient Name: [name] [name], [name] Date of Service: 04/11/2021 11:15 AM Medical Record Number: [id number] Patient Account Number: [id number] Date of Birth/Sex: [birth year] (88 y.o. F) Treating RN: Levora Dredge Primary Care Eswin Worrell: [name] Other Clinician: Referring Alfard Cochrane: [name] Treating Jarious Lyon/Extender: Skipper Cliche in Treatment: 57 Active Inactive Electronic Signature(s) Signed: 04/11/2021 4:04:42 PM By: Levora Dredge Entered By: Levora Dredge on 04/11/2021 11:49:21 [name] [name], [name] [name] ([id number]) -------------------------------------------------------------------------------- Pain Assessment  Details Patient Name: [name] [name], [name] Date of Service: 04/11/2021 11:15 AM Medical Record Number: [id number] Patient Account Number: [id number] Date of Birth/Sex: September 07, 1926 (88 y.o. F) Treating RN: Levora Dredge Primary Care Monnica Saltsman: [name] Other Clinician: Referring Kincade Granberg: [name] Treating Chick Cousins/Extender: Skipper Cliche in Treatment: 41 Active Problems Location of Pain Severity and Description of Pain Patient Has Paino No Site Locations Rate the pain. Current Pain Level: 0 Pain Management and Medication Current Pain Management: Electronic Signature(s) Signed: 04/11/2021 4:04:42 PM By: Levora Dredge Entered By: Levora Dredge on 04/11/2021 11:27:32 [name] [name], [name] ([id number]) -------------------------------------------------------------------------------- Patient/Caregiver Education Details Patient Name: Levenhagen, Damoni L. Date of Service: 04/11/2021 11:15 AM Medical Record Number: [id number] Patient Account Number: [id number] Date of Birth/Gender: 1926/12/06 (88 y.o. F) Treating RN: Levora Dredge Primary Care Physician: [name] Other Clinician: Referring Physician: [name] Treating Physician/Extender: Skipper Cliche in Treatment: 69 Education Assessment Education Provided To: Patient and Caregiver Education Topics Provided Wound/Skin Impairment: Handouts: Caring for Your Ulcer Methods: Explain/Verbal Responses: State content correctly Electronic Signature(s) Signed: 04/11/2021 4:04:42 PM By: Levora Dredge Entered By: Levora Dredge on 04/11/2021 11:58:01 [name] [name], [name] [name] ([id number]) -------------------------------------------------------------------------------- Wound Assessment Details Patient Name: [name] [name], [name] [name] L. Date of Service: 04/11/2021 11:15 AM Medical Record Number: [id number] Patient Account Number: [id number] Date of Birth/Sex: 01-20-1927 (88 y.o. F) Treating RN: Levora Dredge Primary Care Giulian Goldring:  [name] Other Clinician: Referring Jaymarie Yeakel: [name] Treating Coolidge Gossard/Extender: Skipper Cliche in Treatment: 41 Wound Status Wound Number: 3 Primary Pressure Ulcer Etiology: Wound Location: Left Foot Wound Open Wounding Event: Pressure Injury Status: Date Acquired: 10/02/2020 Comorbid Cataracts, Anemia, Coronary Artery Disease, Weeks Of Treatment: 27 History: Hypertension, Myocardial Infarction, End Stage Renal Clustered Wound: No Disease, Gout, Osteoarthritis, Dementia Photos Wound Measurements Length: (cm) 0.2 Width: (cm) 0.3 Depth: (cm) 0.2 Area: (cm) 0.047 Volume: (cm) 0.009 % Reduction in Area: 85% % Reduction in Volume: 71% Epithelialization: Small (1-33%) Tunneling: No Undermining: No Wound Description Classification: Category/Stage III Wound Margin: Thickened Exudate Amount: Medium Exudate Type: Serous Exudate Color: amber Foul Odor After Cleansing: No Slough/Fibrino Yes Wound Bed Granulation Amount: Large (67-100%) Exposed Structure Granulation Quality: Pale Fascia Exposed: No Necrotic Amount: Small (1-33%) Fat Layer (Subcutaneous Tissue) Exposed: Yes Necrotic Quality: Adherent Slough Tendon Exposed: No Muscle Exposed: No Joint Exposed: No Bone Exposed: No Treatment Notes Wound #3 (Foot) Wound Laterality: Left Cleanser Soap and Water Discharge Instruction: Gently cleanse wound with antibacterial soap, rinse and pat dry prior to dressing wounds Harney, Gracin L. ([id number]) Peri-Wound Care Topical Primary Dressing Hydrofera Blue Ready Transfer Foam, 2.5x2.5 (in/in) Discharge Instruction: ONY ON TOP of wound only-Apply Hydrofera Blue Ready to wound bed  as directed Secondary Dressing Zetuvit Plus Silicone Border Dressing 4x4 (in/in) Secured With Tubigrip Size D, 3x10 (in/yd) Compression Wrap Compression Stockings Add-Ons Electronic Signature(s) Signed: 04/11/2021 4:04:42 PM By: Levora Dredge Entered By: Levora Dredge on  04/11/2021 11:31:36 Rosiak, Hisako L. ([id number]) -------------------------------------------------------------------------------- Vitals Details Patient Name: Grigoryan, Narcissa L. Date of Service: 04/11/2021 11:15 AM Medical Record Number: [id number] Patient Account Number: [id number] Date of Birth/Sex: October 23, 1926 (88 y.o. F) Treating RN: Levora Dredge Primary Care Avrie Kedzierski: [name] Other Clinician: Referring Khalon Cansler: [name] Treating Jaeceon Michelin/Extender: Skipper Cliche in Treatment: 41 Vital Signs Time Taken: 11:25 Temperature (F): 97.5 Height (in): 58 Pulse (bpm): 76 Weight (lbs): 137 Respiratory Rate (breaths/min): 18 Body Mass Index (BMI): 28.6 Blood Pressure (mmHg): 110/60 Reference Range: 80 - 120 mg / dl Electronic Signature(s) Signed: 04/11/2021 4:04:42 PM By: Levora Dredge Entered By: Levora Dredge on 04/11/2021 11:27:15

## 2021-04-11 NOTE — Progress Notes (Addendum)
Bachmann, Dedra L. ([id number]) Visit Report for 04/11/2021 Chief Complaint Document Details Patient Name: [name] [name], [name] Date of Service: 04/11/2021 11:15 AM Medical Record Number: [id number] Patient Account Number: [id number] Date of Birth/Sex: [birth year] (88 [name] [name].o. F) Treating RN: Levora Dredge Primary Care Provider: [name] Other Clinician: Referring Provider: [name] Treating Provider/Extender: Skipper Cliche in Treatment: 4 Information Obtained from: Patient Chief Complaint Left Heel Pressure Ulcer Electronic Signature(s) Signed: 04/11/2021 11:48:58 AM By: Worthy Keeler PA-C Entered By: Worthy Keeler on 04/11/2021 11:48:58 [name] [name], [name] ([id number]) -------------------------------------------------------------------------------- HPI Details Patient Name: [name] [name], [name] Date of Service: 04/11/2021 11:15 AM Medical Record Number: [id number] Patient Account Number: [id number] Date of Birth/Sex: 05-Nov-1926 (88 [name] [name].o. F) Treating RN: Levora Dredge Primary Care Provider: [name] Other Clinician: Referring Provider: [name] Treating Provider/Extender: Skipper Cliche in Treatment: 57 History of Present Illness HPI Description: 88 year old patient was recently seen by the PCPs office for significant pain right great toe which has been going on since July. She was initially treated with Keflex which she did not complete and after the office visit this time she has been put on doxycycline. at the time of her visit she was found to have a ulcer on the plantar surface of the right great toe and also had a pyogenic granuloma over this area. X-ray of the right foot done 12/29/2014 -- IMPRESSION:Soft-tissue swelling and ulceration right great toe. No underlying bony lytic lesion identified. If osteomyelitis remains of clinical concern MRI can be obtained. Past medical history significant for anemia, chronic kidney disease stage III, obesity,  varicose veins, coronary artery disease, gout, history of nicotine addiction given up smoking in 2002, hypertension, status post cardiac catheterization, status post abdominal hysterectomy, cholecystectomy and tonsillectomy. hemoglobin A1c done in August was 5.8 01/22/2015 -- at this stage the [facility] Walking boat was going to cost them significant amount of money and they want to defer using that at the present time. 01/29/2015 -- she had a podiatry appointment and they have trimmed her toenails. She has not heard back from the vascular office regarding her venous duplex study and I have asked them to call personally so that they can get the appointment soon. 02/06/2015 -- they have made contact with the vascular office and from what I understand that test has been done but the report is pending. 02/19/2014 -- the vascular test is scheduled for tomorrow Readmission: 06/26/2020 upon evaluation today patient appears for initial evaluation in her clinic that she has been here before in 2016 and has been quite sometime. She did have a fractured hip in February on the 23rd 2022. Subsequently this had to be pinned and she ended up with a pressure injury on her heel following when she was using her foot to help move her around in the bed. Subsequently this has led to the wound that she has been dealing with since that time. She lives at home with her daughter currently she does have dementia. 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 07/03/2020 upon evaluation today patient appears to be doing well with regard to her heel ulcer. She did have arterial studies they appear to be doing excellent it was premature normal across the board with TBI's in the 90s and ABIs well within normal range. Nonetheless there does not appear to be any signs of arterial insufficiency whatsoever. With  that being said  the patient does have also signs of improvement there is some necrotic tissue in the base of the wound number to try to clear some of that away today I do believe the Iodoflex/Iodosorb is doing well. 5/24; difficult punched out wound on the left medial heel. She has been using Iodoflex 07/17/2020 upon evaluation today patient appears to be doing well at this point in regard to her wound. I do feel like this is a little bit deeper but again that is what is expected as we continue with the Iodoflex I think that this is just going to get deeper until we get to the base of the wound. With that being said I think we are getting much closer to the base of the wound where we can have healthier tissue that we get a be managing here which that will be awesome. In the meantime I am not surprised by what I am seeing and in fact the wound appears to be better as compared to previous findings. 07/24/2020 upon evaluation today patient appears to be doing well with regard to her wound. Overall I am extremely pleased with where things stand today. I do not see any signs of active infection which is great and overall I think that the patient is making good progress. There is good to be some need for sharp debridement today. 07/31/2020 upon evaluation today patient appears to be doing better in regard to her heel ulcer. She has been tolerating the dressing changes without complication. Fortunately there does not appear to be any signs of active infection which is great and overall very pleased in this regard. No fevers, chills, nausea, vomiting, or diarrhea. 08/14/2020 upon evaluation today patient appears to be doing better in regard to her heel ulcer. I am very pleased in that regard. Unfortunately she still has a slight deep tissue injury in regard to the medial portion of her foot over the same area. Unfortunately I think this is something that if were not careful it was can open up into the wound. That  is my main concern here based on what I see. Fortunately there does not appear to be any evidence of active infection which is great news and overall very pleased with where things stand at this point. 08/21/2020 upon evaluation today patient appears to be doing well with regard to her wound. She has been tolerating the dressing changes without complication. Fortunately there does not appear to be any signs of active infection at this time. No fevers, chills, nausea, vomiting, or diarrhea. I do believe the compression wrap was beneficial for her. 08/28/2020 upon evaluation today patient appears to be doing well with regard to her wounds currently. Fortunately there does not appear to be any signs of active infection at this time. No fevers, chills, nausea, vomiting, or diarrhea. With that being said I think that her leg is doing quite well to be honest. 09/04/2020 upon evaluation today patient appears to be doing well with regard to her wound on the heel. This is showing signs of good epithelial growth although there is probably can it be an indention where this heals that is okay as long as we get it closed. Fortunately I do not see any evidence of infection at this point. 09/13/2020 upon evaluation today patient appears to be doing well with regard to her wounds. She has been tolerating the dressing changes Sahakian, Takeyla L. ([id number]) without complication. Fortunately he is actually doing extremely well and I think she is making  great progress this is measuring smaller. I would recommend that such that we continue with the [facility] likely since he is doing so well. 09/18/2020 upon evaluation today patient appears to be doing well with regard to her heel ulcer. She is making good progress and I am very pleased with what we see today. I think the Hydrofera Blue is still doing excellent. 10/02/2020 upon evaluation today patient's wound actually appears to be showing signs of good improvement in  regard to the heel. I am very pleased in this regard. With that being said I do believe that the area which is somewhat been stable and dry is starting to lift up and beginning to clear this away as well that is on the foot. Subsequently I am going to have to perform some debridement here and we did actually go ahead and allow this as a wound today as before it was just more deep tissue injury and eschar covering now I think it is a little bit more than that to be honest. 10/09/2020 upon evaluation today patient appears to be doing decently well in regard to her wounds. I am actually very pleased with where things stand today. There does not appear to be any signs of active infection which is great news. No fevers, chills, nausea, vomiting, or diarrhea. She is going require some sharp debridement today. 10/16/2020 upon evaluation today patient appears to be doing decently well in regard to her heel as well as the foot. Both are showing signs of good improvement which is great news. In general and extremely pleased with where things stand at this point. She is tolerating the [facility] well although this is getting so small in the heel I think collagen may be a better option here based on what I am seeing. With regard to the foot the Iodoflex/Iodosorb is still probably the best method here. 10/26/2020 upon evaluation today patient actually appears to be doing well in some respects today. She has been tolerating the dressing changes without complication and this is great news. The Hydrofera Blue is done well for the heel this actually appears to be healed today. With that being said in regard to the foot I think we are ready to switch to [facility] based on what I see at this point. 10/30/2020 upon evaluation today patient appears to be doing well with regard to his wound. He has been tolerating the dressing changes without complication. Fortunately there does not appear to be any signs of active  infection systemically at this time which is great news and overall very pleased with where things stand today. I do think that she is making progress although it somewhat slow still where showing signs of improvement little by little here. 11/06/2020 upon evaluation today patient appears to be doing decently well in regard to her wound. We will start and see better overall appearance of the base of the wound which is great news she still continues to have some abnormal fluorescence signals with a MolecuLight DX that will be detailed below. 11/20/2020 upon evaluation today patient's wound actually showing signs of improvement. There is good to be some need for sharp debridement today and that was discussed with the patient. I am going to go ahead and clean this up but I do believe the [facility] is doing a great job. 11/27/2020; the patient had eschar over the original heel wound which I removed with a #3 curette there was nothing open here. The area is on  the lateral left foot foot. 12/04/2020 upon evaluation today patient appears to be doing well with regard to her wound. The wound bed actually appeared to be doing well after I remove the dry endoform from the wound bed. This I tracked little bit of fluid but nonetheless underneath this appears to be doing awesome. I am very pleased with where things stand today. No fevers, chills, nausea, vomiting, or diarrhea. 12/11/2020 upon evaluation today patient appears to be doing well with regard to her wound although it keeps getting dry and filled up with the endoform I am not seeing any signs of infection but we are going to have to clean this away in order to try and get things moving in an appropriate direction. I discussed that with patient's daughter today as well. She is in agreement with proceeding as such. 12/21/2020 upon evaluation today patient appears to be doing well with regard to her wound this did not seal off like it was with the  collagen but nonetheless also did not really feeling quite as much as I would like to have seen. I do believe that we may need to go ahead and see about doing a debridement today and I really feel like we may want to switch back to the [facility] which I felt like was doing better in the past. Good news is the base of the wound appears healthy with good granulation tissue. 12/28/2020 upon evaluation patient's wound actually showed signs of doing quite well in regard to her foot. I think that we getting very close to complete resolution and overall I am extremely happy with where things stand today. 01/04/2021 upon evaluation today patient appears to be doing well with regard to her wound on the foot. This is good to require little bit of debridement today but overall seems to be doing quite well. I am actually very pleased with where we stand currently. 01/08/2021 upon evaluation today patient's wound actually showing signs of some improvement although she still has some callus buildup around the edges of the wound unfortunately I think this is continuing to rub despite what we are doing currently. I think that we may need to go ahead and see about switching up the dressing a little bit I Minna recommend collagen and then on top of the collagen we will get a use some foam double layered cut in a doughnut to try to take pressure off of this area we will see how this does over the next week. 01/15/2021 upon evaluation today patient's wound is actually showing signs of being completely dried over with the collagen. This is definitely not what we are looking for. I think that the wound dried out too quickly is the main issue that we are running into at this point and I really think we may need to do something else try to keep that from occurring. 01/22/2021 upon evaluation today patient's wound actually did stay open it did not callus over as its been doing in the past this is good news. Also feel  like it may not be quite as deep as what we have previously seen. There is still not as much improvement he does what I would like to see but nonetheless I think we are headed in the right direction. 01/29/2021 upon evaluation today patient appears to be doing okay in regard to her wound is not too dry but unfortunately is too wet the Xeroform just did not do what I was hoping that she made  things a little bit worse as far as size wise I am actually going to try at this point considering this is more open but not as deep the Hydrofera Blue to see if this will be beneficial at this time. Again we seem to be cycling through things and it is becoming a little difficult to get this to completely close but in the past Hydrofera Blue is what she did the best with 1 week to keep it from closing up prematurely. 02/05/2021 upon evaluation today patient's wound actually showing some signs of improvement which is great news. Fortunately I do not see any evidence of active infection locally nor systemically. It is definitely not too wet which is been the biggest issue we have seen up to this point over the past several weeks. [name], Zaylia L. ([id number]) 02/21/2021 upon evaluation today patient appears to be doing excellent in regard to her wound. This actually appears much improved compared to last time I saw her which is great news as well. No fevers, chills, nausea, vomiting, or diarrhea. 02/28/2021 upon evaluation today patient's wound on her foot actually appears to be doing quite well. I am very pleased with where things stand today. I do not see any evidence of active infection locally nor systemically at this time. No fevers, chills, nausea, vomiting, or diarrhea. 03/14/2021 upon evaluation today patient appears to be doing well with regard to her wound. There is a little bit of a limp on the plantar edge of the wound opening which I Minna see about trying to clear away today. Fortunately I think other  than this everything seems to be showing signs of improvement measurements still are roughly the same. It is definitely not as deep however. 03/28/2021 upon evaluation today patient appears to be doing well with regard to her wound. There is still some slough noted in the base of the wound. Fortunately there does not appear to be any evidence of active infection locally nor systemically at this time which is good news. 04/11/2021 upon evaluation today patient appears to be doing decently well in regard to her foot ulcer. This is showing a little bit of moisture buildup but fortunately nothing too significant I think the wound is looking better. This is just very slow to heal. Fortunately I do not see any evidence of active infection at this time. Electronic Signature(s) Signed: 04/11/2021 1:10:58 PM By: Worthy Keeler PA-C Entered By: Worthy Keeler on 04/11/2021 13:10:58 [name] [name], [name] ([id number]) -------------------------------------------------------------------------------- Physical Exam Details Patient Name: Masden, Tritia L. Date of Service: 04/11/2021 11:15 AM Medical Record Number: [id number] Patient Account Number: [id number] Date of Birth/Sex: 1927/01/05 (88 [name] [name].o. F) Treating RN: Levora Dredge Primary Care Provider: [name] Other Clinician: Referring Provider: [name] Treating Provider/Extender: Skipper Cliche in Treatment: 42 Constitutional Well-nourished and well-hydrated in no acute distress. Respiratory normal breathing without difficulty. Psychiatric this patient is able to make decisions and demonstrates good insight into disease process. Alert and Oriented x 3. pleasant and cooperative. Notes Upon inspection patient's wound bed showed evidence of good granulation epithelization at this point. Fortunately I do not see any evidence of active infection locally or systemically which is great news and overall I am extremely pleased with where we  stand. Electronic Signature(s) Signed: 04/11/2021 1:11:18 PM By: Worthy Keeler PA-C Entered By: Worthy Keeler on 04/11/2021 13:11:18 [name] [name], [name] ([id number]) -------------------------------------------------------------------------------- Physician Orders Details Patient Name: [name] [name], [name] Date of Service: 04/11/2021 11:15 AM Medical Record Number:  [id number] Patient Account Number: [id number] Date of Birth/Sex: 08-Oct-1926 (88 [name] [name].o. F) Treating RN: Levora Dredge Primary Care Provider: [name] Other Clinician: Referring Provider: [name] Treating Provider/Extender: Skipper Cliche in Treatment: 63 Verbal / Phone Orders: No Diagnosis Coding ICD-10 Coding Code Description [id number] Pressure ulcer of other site, stage 3 I10 Essential (primary) hypertension F01.50 Vascular dementia without behavioral disturbance I25.10 Atherosclerotic heart disease of native coronary artery without angina pectoris Follow-up Appointments o Return Appointment in 1 week. o Nurse Visit as needed Bathing/ Shower/ Hygiene o May shower with wound dressing protected with water repellent cover or cast protector. o No tub bath. Anesthetic (Use 'Patient Medications' Section for Anesthetic Order Entry) o Lidocaine applied to wound bed Edema Control - Lymphedema / Segmental Compressive Device / Other o Optional: One layer of unna paste to top of compression wrap (to act as an anchor). o Tubigrip single layer applied. - left leg size D o Patient to wear own compression stockings. Remove compression stockings every night before going to bed and put on every morning when getting up. - right leg o Elevate legs to the level of the heart and pump ankles as often as possible o Elevate leg(s) parallel to the floor when sitting. o DO YOUR BEST to sleep in the bed at night. DO NOT sleep in your recliner. Long hours of sitting in a recliner leads to swelling of the legs  and/or potential wounds on your backside. Off-Loading o Other: - Offloading boots while in bed; Try pillow under sheet to keep it in place. Additional Orders / Instructions o Follow Nutritious Diet and Increase Protein Intake Wound Treatment Wound #3 - Foot Wound Laterality: Left Cleanser: Soap and Water 2 x Per Week/30 Days Discharge Instructions: Gently cleanse wound with antibacterial soap, rinse and pat dry prior to dressing wounds Primary Dressing: Hydrofera Blue Ready Transfer Foam, 2.5x2.5 (in/in) 2 x Per Week/30 Days Discharge Instructions: ONY ON TOP of wound only-Apply Hydrofera Blue Ready to wound bed as directed Secondary Dressing: Zetuvit Plus Silicone Border Dressing 4x4 (in/in) 2 x Per Week/30 Days Secured With: Tubigrip Size D, 3x10 (in/yd) 2 x Per Week/30 Days Electronic Signature(s) Signed: 04/11/2021 4:04:42 PM By: Levora Dredge Signed: 04/12/2021 4:05:24 PM By: Worthy Keeler PA-C Entered By: Levora Dredge on 04/11/2021 11:52:13 [name] [name], [name] ([id number]) -------------------------------------------------------------------------------- Problem List Details Patient Name: Pinkett, Dagny L. Date of Service: 04/11/2021 11:15 AM Medical Record Number: [id number] Patient Account Number: [id number] Date of Birth/Sex: 25-May-1926 (88 [name] [name].o. F) Treating RN: Levora Dredge Primary Care Provider: [name] Other Clinician: Referring Provider: [name] Treating Provider/Extender: Skipper Cliche in Treatment: 29 Active Problems ICD-10 Encounter Code Description Active Date MDM Diagnosis [phone] Pressure ulcer of other site, stage 3 10/02/2020 No Yes I10 Essential (primary) hypertension 06/26/2020 No Yes F01.50 Vascular dementia without behavioral disturbance 06/26/2020 No Yes I25.10 Atherosclerotic heart disease of native coronary artery without angina 06/26/2020 No Yes pectoris Inactive Problems Resolved Problems ICD-10 Code Description Active Date  Resolved Date L89.623 Pressure ulcer of left heel, stage 3 06/26/2020 06/26/2020 Electronic Signature(s) Signed: 04/11/2021 11:34:02 AM By: Worthy Keeler PA-C Entered By: Worthy Keeler on 04/11/2021 11:34:02 [name] [name], [name] ([id number]) -------------------------------------------------------------------------------- Progress Note Details Patient Name: [name] [name], [name] Date of Service: 04/11/2021 11:15 AM Medical Record Number: [id number] Patient Account Number: [id number] Date of Birth/Sex: 1926-03-11 (88 [name] [name].o. F) Treating RN: Levora Dredge Primary Care Provider: [name] Other Clinician: Referring Provider: [name] Treating Provider/Extender: Skipper Cliche in Treatment:  41 Subjective Chief Complaint Information obtained from Patient Left Heel Pressure Ulcer History of Present Illness (HPI) 88 year old patient was recently seen by the PCPs office for significant pain right great toe which has been going on since July. She was initially treated with Keflex which she did not complete and after the office visit this time she has been put on doxycycline. at the time of her visit she was found to have a ulcer on the plantar surface of the right great toe and also had a pyogenic granuloma over this area. X-ray of the right foot done 12/29/2014 -- IMPRESSION:Soft-tissue swelling and ulceration right great toe. No underlying bony lytic lesion identified. If osteomyelitis remains of clinical concern MRI can be obtained. Past medical history significant for anemia, chronic kidney disease stage III, obesity, varicose veins, coronary artery disease, gout, history of nicotine addiction given up smoking in 2002, hypertension, status post cardiac catheterization, status post abdominal hysterectomy, cholecystectomy and tonsillectomy. hemoglobin A1c done in August was 5.8 01/22/2015 -- at this stage the [facility] Walking boat was going to cost them significant amount of money and they want  to defer using that at the present time. 01/29/2015 -- she had a podiatry appointment and they have trimmed her toenails. She has not heard back from the vascular office regarding her venous duplex study and I have asked them to call personally so that they can get the appointment soon. 02/06/2015 -- they have made contact with the vascular office and from what I understand that test has been done but the report is pending. 02/19/2014 -- the vascular test is scheduled for tomorrow Readmission: 06/26/2020 upon evaluation today patient appears for initial evaluation in her clinic that she has been here before in 2016 and has been quite sometime. She did have a fractured hip in February on the 23rd 2022. Subsequently this had to be pinned and she ended up with a pressure injury on her heel following when she was using her foot to help move her around in the bed. Subsequently this has led to the wound that she has been dealing with since that time. She lives at home with her daughter currently she does have dementia. 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 07/03/2020 upon evaluation today patient appears to be doing well with regard to her heel ulcer. She did have arterial studies they appear to be doing excellent it was premature normal across the board with TBI's in the 90s and ABIs well within normal range. Nonetheless there does not appear to be any signs of arterial insufficiency whatsoever. With that being said the patient does have also signs of improvement there is some necrotic tissue in the base of the wound number to try to clear some of that away today I do believe the Iodoflex/Iodosorb is doing well. 5/24; difficult punched out wound on the left medial heel. She has been using Iodoflex 07/17/2020 upon evaluation today patient appears to be doing well at this  point in regard to her wound. I do feel like this is a little bit deeper but again that is what is expected as we continue with the Iodoflex I think that this is just going to get deeper until we get to the base of the wound. With that being said I think we are getting much closer to the base of the wound where we can have healthier tissue  that we get a be managing here which that will be awesome. In the meantime I am not surprised by what I am seeing and in fact the wound appears to be better as compared to previous findings. 07/24/2020 upon evaluation today patient appears to be doing well with regard to her wound. Overall I am extremely pleased with where things stand today. I do not see any signs of active infection which is great and overall I think that the patient is making good progress. There is good to be some need for sharp debridement today. 07/31/2020 upon evaluation today patient appears to be doing better in regard to her heel ulcer. She has been tolerating the dressing changes without complication. Fortunately there does not appear to be any signs of active infection which is great and overall very pleased in this regard. No fevers, chills, nausea, vomiting, or diarrhea. 08/14/2020 upon evaluation today patient appears to be doing better in regard to her heel ulcer. I am very pleased in that regard. Unfortunately she still has a slight deep tissue injury in regard to the medial portion of her foot over the same area. Unfortunately I think this is something that if were not careful it was can open up into the wound. That is my main concern here based on what I see. Fortunately there does not appear to be any evidence of active infection which is great news and overall very pleased with where things stand at this point. 08/21/2020 upon evaluation today patient appears to be doing well with regard to her wound. She has been tolerating the dressing changes without complication. Fortunately  there does not appear to be any signs of active infection at this time. No fevers, chills, nausea, vomiting, or diarrhea. I do believe the compression wrap was beneficial for her. 08/28/2020 upon evaluation today patient appears to be doing well with regard to her wounds currently. Fortunately there does not appear to be any signs of active infection at this time. No fevers, chills, nausea, vomiting, or diarrhea. With that being said I think that her leg is doing quite well to be honest. [name] [name], [name] ([id number]) 09/04/2020 upon evaluation today patient appears to be doing well with regard to her wound on the heel. This is showing signs of good epithelial growth although there is probably can it be an indention where this heals that is okay as long as we get it closed. Fortunately I do not see any evidence of infection at this point. 09/13/2020 upon evaluation today patient appears to be doing well with regard to her wounds. She has been tolerating the dressing changes without complication. Fortunately he is actually doing extremely well and I think she is making great progress this is measuring smaller. I would recommend that such that we continue with the [facility] likely since he is doing so well. 09/18/2020 upon evaluation today patient appears to be doing well with regard to her heel ulcer. She is making good progress and I am very pleased with what we see today. I think the Hydrofera Blue is still doing excellent. 10/02/2020 upon evaluation today patient's wound actually appears to be showing signs of good improvement in regard to the heel. I am very pleased in this regard. With that being said I do believe that the area which is somewhat been stable and dry is starting to lift up and beginning to clear this away as well that is on the foot. Subsequently I am going to have to perform  some debridement here and we did actually go ahead and allow this as a wound today as before it was just  more deep tissue injury and eschar covering now I think it is a little bit more than that to be honest. 10/09/2020 upon evaluation today patient appears to be doing decently well in regard to her wounds. I am actually very pleased with where things stand today. There does not appear to be any signs of active infection which is great news. No fevers, chills, nausea, vomiting, or diarrhea. She is going require some sharp debridement today. 10/16/2020 upon evaluation today patient appears to be doing decently well in regard to her heel as well as the foot. Both are showing signs of good improvement which is great news. In general and extremely pleased with where things stand at this point. She is tolerating the [facility] well although this is getting so small in the heel I think collagen may be a better option here based on what I am seeing. With regard to the foot the Iodoflex/Iodosorb is still probably the best method here. 10/26/2020 upon evaluation today patient actually appears to be doing well in some respects today. She has been tolerating the dressing changes without complication and this is great news. The Hydrofera Blue is done well for the heel this actually appears to be healed today. With that being said in regard to the foot I think we are ready to switch to [facility] based on what I see at this point. 10/30/2020 upon evaluation today patient appears to be doing well with regard to his wound. He has been tolerating the dressing changes without complication. Fortunately there does not appear to be any signs of active infection systemically at this time which is great news and overall very pleased with where things stand today. I do think that she is making progress although it somewhat slow still where showing signs of improvement little by little here. 11/06/2020 upon evaluation today patient appears to be doing decently well in regard to her wound. We will start and see better  overall appearance of the base of the wound which is great news she still continues to have some abnormal fluorescence signals with a MolecuLight DX that will be detailed below. 11/20/2020 upon evaluation today patient's wound actually showing signs of improvement. There is good to be some need for sharp debridement today and that was discussed with the patient. I am going to go ahead and clean this up but I do believe the [facility] is doing a great job. 11/27/2020; the patient had eschar over the original heel wound which I removed with a #3 curette there was nothing open here. The area is on the lateral left foot foot. 12/04/2020 upon evaluation today patient appears to be doing well with regard to her wound. The wound bed actually appeared to be doing well after I remove the dry endoform from the wound bed. This I tracked little bit of fluid but nonetheless underneath this appears to be doing awesome. I am very pleased with where things stand today. No fevers, chills, nausea, vomiting, or diarrhea. 12/11/2020 upon evaluation today patient appears to be doing well with regard to her wound although it keeps getting dry and filled up with the endoform I am not seeing any signs of infection but we are going to have to clean this away in order to try and get things moving in an appropriate direction. I discussed that with patient's daughter today as  well. She is in agreement with proceeding as such. 12/21/2020 upon evaluation today patient appears to be doing well with regard to her wound this did not seal off like it was with the collagen but nonetheless also did not really feeling quite as much as I would like to have seen. I do believe that we may need to go ahead and see about doing a debridement today and I really feel like we may want to switch back to the [facility] which I felt like was doing better in the past. Good news is the base of the wound appears healthy with good granulation  tissue. 12/28/2020 upon evaluation patient's wound actually showed signs of doing quite well in regard to her foot. I think that we getting very close to complete resolution and overall I am extremely happy with where things stand today. 01/04/2021 upon evaluation today patient appears to be doing well with regard to her wound on the foot. This is good to require little bit of debridement today but overall seems to be doing quite well. I am actually very pleased with where we stand currently. 01/08/2021 upon evaluation today patient's wound actually showing signs of some improvement although she still has some callus buildup around the edges of the wound unfortunately I think this is continuing to rub despite what we are doing currently. I think that we may need to go ahead and see about switching up the dressing a little bit I Minna recommend collagen and then on top of the collagen we will get a use some foam double layered cut in a doughnut to try to take pressure off of this area we will see how this does over the next week. 01/15/2021 upon evaluation today patient's wound is actually showing signs of being completely dried over with the collagen. This is definitely not what we are looking for. I think that the wound dried out too quickly is the main issue that we are running into at this point and I really think we may need to do something else try to keep that from occurring. 01/22/2021 upon evaluation today patient's wound actually did stay open it did not callus over as its been doing in the past this is good news. Also feel like it may not be quite as deep as what we have previously seen. There is still not as much improvement he does what I would like to see but nonetheless I think we are headed in the right direction. 01/29/2021 upon evaluation today patient appears to be doing okay in regard to her wound is not too dry but unfortunately is too wet the Xeroform just did not do what I was  hoping that she made things a little bit worse as far as size wise I am actually going to try at this point considering this is more open but not as deep the Hydrofera Blue to see if this will be beneficial at this time. Again we seem to be cycling through things and it is becoming a little difficult to get this to completely close but in the past Hydrofera Blue is what she did the best with 1 week to keep [name] [name], [name] ([id number]) it from closing up prematurely. 02/05/2021 upon evaluation today patient's wound actually showing some signs of improvement which is great news. Fortunately I do not see any evidence of active infection locally nor systemically. It is definitely not too wet which is been the biggest issue we have seen up to this  point over the past several weeks. 02/21/2021 upon evaluation today patient appears to be doing excellent in regard to her wound. This actually appears much improved compared to last time I saw her which is great news as well. No fevers, chills, nausea, vomiting, or diarrhea. 02/28/2021 upon evaluation today patient's wound on her foot actually appears to be doing quite well. I am very pleased with where things stand today. I do not see any evidence of active infection locally nor systemically at this time. No fevers, chills, nausea, vomiting, or diarrhea. 03/14/2021 upon evaluation today patient appears to be doing well with regard to her wound. There is a little bit of a limp on the plantar edge of the wound opening which I Minna see about trying to clear away today. Fortunately I think other than this everything seems to be showing signs of improvement measurements still are roughly the same. It is definitely not as deep however. 03/28/2021 upon evaluation today patient appears to be doing well with regard to her wound. There is still some slough noted in the base of the wound. Fortunately there does not appear to be any evidence of active infection locally nor  systemically at this time which is good news. 04/11/2021 upon evaluation today patient appears to be doing decently well in regard to her foot ulcer. This is showing a little bit of moisture buildup but fortunately nothing too significant I think the wound is looking better. This is just very slow to heal. Fortunately I do not see any evidence of active infection at this time. Objective Constitutional Well-nourished and well-hydrated in no acute distress. Vitals Time Taken: 11:25 AM, Height: 58 in, Weight: 137 lbs, BMI: 28.6, Temperature: 97.5 F, Pulse: 76 bpm, Respiratory Rate: 18 breaths/min, Blood Pressure: 110/60 mmHg. Respiratory normal breathing without difficulty. Psychiatric this patient is able to make decisions and demonstrates good insight into disease process. Alert and Oriented x 3. pleasant and cooperative. General Notes: Upon inspection patient's wound bed showed evidence of good granulation epithelization at this point. Fortunately I do not see any evidence of active infection locally or systemically which is great news and overall I am extremely pleased with where we stand. Integumentary (Hair, Skin) Wound #3 status is Open. Original cause of wound was Pressure Injury. The date acquired was: 10/02/2020. The wound has been in treatment 27 weeks. The wound is located on the Left Foot. The wound measures 0.2cm length x 0.3cm width x 0.2cm depth; 0.047cm^2 area and 0.009cm^3 volume. There is Fat Layer (Subcutaneous Tissue) exposed. There is no tunneling or undermining noted. There is a medium amount of serous drainage noted. The wound margin is thickened. There is large (67-100%) pale granulation within the wound bed. There is a small (1-33%) amount of necrotic tissue within the wound bed including Adherent Slough. Assessment Active Problems ICD-10 Pressure ulcer of other site, stage 3 Essential (primary) hypertension Vascular dementia without behavioral  disturbance Atherosclerotic heart disease of native coronary artery without angina pectoris [name] [name], [name] ([id number]) Plan Follow-up Appointments: Return Appointment in 1 week. Nurse Visit as needed Bathing/ Shower/ Hygiene: May shower with wound dressing protected with water repellent cover or cast protector. No tub bath. Anesthetic (Use 'Patient Medications' Section for Anesthetic Order Entry): Lidocaine applied to wound bed Edema Control - Lymphedema / Segmental Compressive Device / Other: Optional: One layer of unna paste to top of compression wrap (to act as an anchor). Tubigrip single layer applied. - left leg size D Patient to wear own  compression stockings. Remove compression stockings every night before going to bed and put on every morning when getting up. - right leg Elevate legs to the level of the heart and pump ankles as often as possible Elevate leg(s) parallel to the floor when sitting. DO YOUR BEST to sleep in the bed at night. DO NOT sleep in your recliner. Long hours of sitting in a recliner leads to swelling of the legs and/or potential wounds on your backside. Off-Loading: Other: - Offloading boots while in bed; Try pillow under sheet to keep it in place. Additional Orders / Instructions: Follow Nutritious Diet and Increase Protein Intake WOUND #3: - Foot Wound Laterality: Left Cleanser: Soap and Water 2 x Per Week/30 Days Discharge Instructions: Gently cleanse wound with antibacterial soap, rinse and pat dry prior to dressing wounds Primary Dressing: Hydrofera Blue Ready Transfer Foam, 2.5x2.5 (in/in) 2 x Per Week/30 Days Discharge Instructions: ONY ON TOP of wound only-Apply Hydrofera Blue Ready to wound bed as directed Secondary Dressing: Zetuvit Plus Silicone Border Dressing 4x4 (in/in) 2 x Per Week/30 Days Secured With: Tubigrip Size D, 3x10 (in/yd) 2 x Per Week/30 Days 1. Would recommend that we going continue with the wound care measures as before  using the [facility] which I think is doing a decently good job. 2. I am also can recommend that we have the patient continue to monitor for any signs of worsening infection if anything changes she should let me know. We will see patient back for reevaluation in 1 week here in the clinic. If anything worsens or changes patient will contact our office for additional recommendations. Electronic Signature(s) Signed: 04/11/2021 1:11:44 PM By: Worthy Keeler PA-C Entered By: Worthy Keeler on 04/11/2021 13:11:44 [name] [name], Yelena L. ([id number]) -------------------------------------------------------------------------------- SuperBill Details Patient Name: Grippi, Jarica L. Date of Service: 04/11/2021 Medical Record Number: [id number] Patient Account Number: [id number] Date of Birth/Sex: 04-08-1926 (88 [name] [name].o. F) Treating RN: Levora Dredge Primary Care Provider: [name] Other Clinician: Referring Provider: [name] Treating Provider/Extender: Skipper Cliche in Treatment: 41 Diagnosis Coding ICD-10 Codes Code Description [phone] Pressure ulcer of other site, stage 3 I10 Essential (primary) hypertension I25.10 Atherosclerotic heart disease of native coronary artery without angina pectoris Facility Procedures CPT4 Code: [id number] Description: [phone] - WOUND CARE VISIT-LEV 2 EST PT Modifier: Quantity: 1 Physician Procedures CPT4 Code: [id number] Description: 99214 - WC PHYS LEVEL 4 - EST PT Modifier: Quantity: 1 CPT4 Code: Description: ICD-10 Diagnosis Description L89.893 Pressure ulcer of other site, stage 3 I10 Essential (primary) hypertension I25.10 Atherosclerotic heart disease of native coronary artery without angina Modifier: pectoris Quantity: Electronic Signature(s) Signed: 04/11/2021 1:12:11 PM By: Worthy Keeler PA-C Entered By: Worthy Keeler on 04/11/2021 13:12:11

## 2021-04-18 ENCOUNTER — Encounter: Payer: PPO | Attending: Physician Assistant | Admitting: Physician Assistant

## 2021-04-18 ENCOUNTER — Other Ambulatory Visit: Payer: Self-pay

## 2021-04-18 DIAGNOSIS — F0150 Vascular dementia without behavioral disturbance: Secondary | ICD-10-CM | POA: Insufficient documentation

## 2021-04-18 DIAGNOSIS — N183 Chronic kidney disease, stage 3 unspecified: Secondary | ICD-10-CM | POA: Insufficient documentation

## 2021-04-18 DIAGNOSIS — L89893 Pressure ulcer of other site, stage 3: Secondary | ICD-10-CM | POA: Diagnosis not present

## 2021-04-18 DIAGNOSIS — I120 Hypertensive chronic kidney disease with stage 5 chronic kidney disease or end stage renal disease: Secondary | ICD-10-CM | POA: Diagnosis not present

## 2021-04-18 DIAGNOSIS — I2510 Atherosclerotic heart disease of native coronary artery without angina pectoris: Secondary | ICD-10-CM | POA: Insufficient documentation

## 2021-04-18 DIAGNOSIS — L97522 Non-pressure chronic ulcer of other part of left foot with fat layer exposed: Secondary | ICD-10-CM | POA: Diagnosis not present

## 2021-04-18 DIAGNOSIS — Z6828 Body mass index (BMI) 28.0-28.9, adult: Secondary | ICD-10-CM | POA: Insufficient documentation

## 2021-04-18 DIAGNOSIS — Z87891 Personal history of nicotine dependence: Secondary | ICD-10-CM | POA: Diagnosis not present

## 2021-04-18 DIAGNOSIS — I10 Essential (primary) hypertension: Secondary | ICD-10-CM | POA: Diagnosis not present

## 2021-04-18 DIAGNOSIS — E669 Obesity, unspecified: Secondary | ICD-10-CM | POA: Insufficient documentation

## 2021-04-18 NOTE — Progress Notes (Addendum)
Vassey, Dawsyn L. ([id number]) Visit Report for 04/18/2021 Chief Complaint Document Details Patient Name: [name] [name], [name] Date of Service: 04/18/2021 1:15 PM Medical Record Number: [id number] Patient Account Number: [id number] Date of Birth/Sex: Feb 06, 1927 (88 y.o. F) Treating RN: Carlene Coria Primary Care Provider: [name] Other Clinician: Referring Provider: [name] Treating Provider/Extender: Skipper Cliche in Treatment: 68 Information Obtained from: Patient Chief Complaint Left Heel Pressure Ulcer Electronic Signature(s) Signed: 04/18/2021 1:20:26 PM By: Worthy Keeler PA-C Entered By: Worthy Keeler on 04/18/2021 13:20:26 [name] [name], [name] ([id number]) -------------------------------------------------------------------------------- HPI Details Patient Name: [name] [name], [name] Date of Service: 04/18/2021 1:15 PM Medical Record Number: [id number] Patient Account Number: [id number] Date of Birth/Sex: [birth year] (88 y.o. F) Treating RN: Carlene Coria Primary Care Provider: [name] Other Clinician: Referring Provider: [name] Treating Provider/Extender: Skipper Cliche in Treatment: 21 History of Present Illness HPI Description: 88 year old patient was recently seen by the PCPs office for significant pain right great toe which has been going on since July. She was initially treated with Keflex which she did not complete and after the office visit this time she has been put on doxycycline. at the time of her visit she was found to have a ulcer on the plantar surface of the right great toe and also had a pyogenic granuloma over this area. X-ray of the right foot done 12/29/2014 -- IMPRESSION:Soft-tissue swelling and ulceration right great toe. No underlying bony lytic lesion identified. If osteomyelitis remains of clinical concern MRI can be obtained. Past medical history significant for anemia, chronic kidney disease stage III, obesity, varicose veins,  coronary artery disease, gout, history of nicotine addiction given up smoking in 2002, hypertension, status post cardiac catheterization, status post abdominal hysterectomy, cholecystectomy and tonsillectomy. hemoglobin A1c done in August was 5.8 01/22/2015 -- at this stage the [facility] Walking boat was going to cost them significant amount of money and they want to defer using that at the present time. 01/29/2015 -- she had a podiatry appointment and they have trimmed her toenails. She has not heard back from the vascular office regarding her venous duplex study and I have asked them to call personally so that they can get the appointment soon. 02/06/2015 -- they have made contact with the vascular office and from what I understand that test has been done but the report is pending. 02/19/2014 -- the vascular test is scheduled for tomorrow Readmission: 06/26/2020 upon evaluation today patient appears for initial evaluation in her clinic that she has been here before in 2016 and has been quite sometime. She did have a fractured hip in February on the 23rd 2022. Subsequently this had to be pinned and she ended up with a pressure injury on her heel following when she was using her foot to help move her around in the bed. Subsequently this has led to the wound that she has been dealing with since that time. She lives at home with her daughter currently she does have dementia. 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 07/03/2020 upon evaluation today patient appears to be doing well with regard to her heel ulcer. She did have arterial studies they appear to be doing excellent it was premature normal across the board with TBI's in the 90s and ABIs well within normal range. Nonetheless there does not appear to be any signs of arterial insufficiency whatsoever. With that being said  the patient does have also signs of improvement there is some necrotic tissue in the base of the wound number to try to clear some of that away today I do believe the Iodoflex/Iodosorb is doing well. 5/24; difficult punched out wound on the left medial heel. She has been using Iodoflex 07/17/2020 upon evaluation today patient appears to be doing well at this point in regard to her wound. I do feel like this is a little bit deeper but again that is what is expected as we continue with the Iodoflex I think that this is just going to get deeper until we get to the base of the wound. With that being said I think we are getting much closer to the base of the wound where we can have healthier tissue that we get a be managing here which that will be awesome. In the meantime I am not surprised by what I am seeing and in fact the wound appears to be better as compared to previous findings. 07/24/2020 upon evaluation today patient appears to be doing well with regard to her wound. Overall I am extremely pleased with where things stand today. I do not see any signs of active infection which is great and overall I think that the patient is making good progress. There is good to be some need for sharp debridement today. 07/31/2020 upon evaluation today patient appears to be doing better in regard to her heel ulcer. She has been tolerating the dressing changes without complication. Fortunately there does not appear to be any signs of active infection which is great and overall very pleased in this regard. No fevers, chills, nausea, vomiting, or diarrhea. 08/14/2020 upon evaluation today patient appears to be doing better in regard to her heel ulcer. I am very pleased in that regard. Unfortunately she still has a slight deep tissue injury in regard to the medial portion of her foot over the same area. Unfortunately I think this is something that if were not careful it was can open up into the wound. That is my main  concern here based on what I see. Fortunately there does not appear to be any evidence of active infection which is great news and overall very pleased with where things stand at this point. 08/21/2020 upon evaluation today patient appears to be doing well with regard to her wound. She has been tolerating the dressing changes without complication. Fortunately there does not appear to be any signs of active infection at this time. No fevers, chills, nausea, vomiting, or diarrhea. I do believe the compression wrap was beneficial for her. 08/28/2020 upon evaluation today patient appears to be doing well with regard to her wounds currently. Fortunately there does not appear to be any signs of active infection at this time. No fevers, chills, nausea, vomiting, or diarrhea. With that being said I think that her leg is doing quite well to be honest. 09/04/2020 upon evaluation today patient appears to be doing well with regard to her wound on the heel. This is showing signs of good epithelial growth although there is probably can it be an indention where this heals that is okay as long as we get it closed. Fortunately I do not see any evidence of infection at this point. 09/13/2020 upon evaluation today patient appears to be doing well with regard to her wounds. She has been tolerating the dressing changes Scrivens, Alliyah L. ([id number]) without complication. Fortunately he is actually doing extremely well and I think she is making  great progress this is measuring smaller. I would recommend that such that we continue with the [facility] likely since he is doing so well. 09/18/2020 upon evaluation today patient appears to be doing well with regard to her heel ulcer. She is making good progress and I am very pleased with what we see today. I think the Hydrofera Blue is still doing excellent. 10/02/2020 upon evaluation today patient's wound actually appears to be showing signs of good improvement in regard to the  heel. I am very pleased in this regard. With that being said I do believe that the area which is somewhat been stable and dry is starting to lift up and beginning to clear this away as well that is on the foot. Subsequently I am going to have to perform some debridement here and we did actually go ahead and allow this as a wound today as before it was just more deep tissue injury and eschar covering now I think it is a little bit more than that to be honest. 10/09/2020 upon evaluation today patient appears to be doing decently well in regard to her wounds. I am actually very pleased with where things stand today. There does not appear to be any signs of active infection which is great news. No fevers, chills, nausea, vomiting, or diarrhea. She is going require some sharp debridement today. 10/16/2020 upon evaluation today patient appears to be doing decently well in regard to her heel as well as the foot. Both are showing signs of good improvement which is great news. In general and extremely pleased with where things stand at this point. She is tolerating the [facility] well although this is getting so small in the heel I think collagen may be a better option here based on what I am seeing. With regard to the foot the Iodoflex/Iodosorb is still probably the best method here. 10/26/2020 upon evaluation today patient actually appears to be doing well in some respects today. She has been tolerating the dressing changes without complication and this is great news. The Hydrofera Blue is done well for the heel this actually appears to be healed today. With that being said in regard to the foot I think we are ready to switch to [facility] based on what I see at this point. 10/30/2020 upon evaluation today patient appears to be doing well with regard to his wound. He has been tolerating the dressing changes without complication. Fortunately there does not appear to be any signs of active infection  systemically at this time which is great news and overall very pleased with where things stand today. I do think that she is making progress although it somewhat slow still where showing signs of improvement little by little here. 11/06/2020 upon evaluation today patient appears to be doing decently well in regard to her wound. We will start and see better overall appearance of the base of the wound which is great news she still continues to have some abnormal fluorescence signals with a MolecuLight DX that will be detailed below. 11/20/2020 upon evaluation today patient's wound actually showing signs of improvement. There is good to be some need for sharp debridement today and that was discussed with the patient. I am going to go ahead and clean this up but I do believe the [facility] is doing a great job. 11/27/2020; the patient had eschar over the original heel wound which I removed with a #3 curette there was nothing open here. The area is on  the lateral left foot foot. 12/04/2020 upon evaluation today patient appears to be doing well with regard to her wound. The wound bed actually appeared to be doing well after I remove the dry endoform from the wound bed. This I tracked little bit of fluid but nonetheless underneath this appears to be doing awesome. I am very pleased with where things stand today. No fevers, chills, nausea, vomiting, or diarrhea. 12/11/2020 upon evaluation today patient appears to be doing well with regard to her wound although it keeps getting dry and filled up with the endoform I am not seeing any signs of infection but we are going to have to clean this away in order to try and get things moving in an appropriate direction. I discussed that with patient's daughter today as well. She is in agreement with proceeding as such. 12/21/2020 upon evaluation today patient appears to be doing well with regard to her wound this did not seal off like it was with the collagen  but nonetheless also did not really feeling quite as much as I would like to have seen. I do believe that we may need to go ahead and see about doing a debridement today and I really feel like we may want to switch back to the [facility] which I felt like was doing better in the past. Good news is the base of the wound appears healthy with good granulation tissue. 12/28/2020 upon evaluation patient's wound actually showed signs of doing quite well in regard to her foot. I think that we getting very close to complete resolution and overall I am extremely happy with where things stand today. 01/04/2021 upon evaluation today patient appears to be doing well with regard to her wound on the foot. This is good to require little bit of debridement today but overall seems to be doing quite well. I am actually very pleased with where we stand currently. 01/08/2021 upon evaluation today patient's wound actually showing signs of some improvement although she still has some callus buildup around the edges of the wound unfortunately I think this is continuing to rub despite what we are doing currently. I think that we may need to go ahead and see about switching up the dressing a little bit I Minna recommend collagen and then on top of the collagen we will get a use some foam double layered cut in a doughnut to try to take pressure off of this area we will see how this does over the next week. 01/15/2021 upon evaluation today patient's wound is actually showing signs of being completely dried over with the collagen. This is definitely not what we are looking for. I think that the wound dried out too quickly is the main issue that we are running into at this point and I really think we may need to do something else try to keep that from occurring. 01/22/2021 upon evaluation today patient's wound actually did stay open it did not callus over as its been doing in the past this is good news. Also feel like it may  not be quite as deep as what we have previously seen. There is still not as much improvement he does what I would like to see but nonetheless I think we are headed in the right direction. 01/29/2021 upon evaluation today patient appears to be doing okay in regard to her wound is not too dry but unfortunately is too wet the Xeroform just did not do what I was hoping that she made  things a little bit worse as far as size wise I am actually going to try at this point considering this is more open but not as deep the Hydrofera Blue to see if this will be beneficial at this time. Again we seem to be cycling through things and it is becoming a little difficult to get this to completely close but in the past Hydrofera Blue is what she did the best with 1 week to keep it from closing up prematurely. 02/05/2021 upon evaluation today patient's wound actually showing some signs of improvement which is great news. Fortunately I do not see any evidence of active infection locally nor systemically. It is definitely not too wet which is been the biggest issue we have seen up to this point over the past several weeks. [name] [name], [name] ([id number]) 02/21/2021 upon evaluation today patient appears to be doing excellent in regard to her wound. This actually appears much improved compared to last time I saw her which is great news as well. No fevers, chills, nausea, vomiting, or diarrhea. 02/28/2021 upon evaluation today patient's wound on her foot actually appears to be doing quite well. I am very pleased with where things stand today. I do not see any evidence of active infection locally nor systemically at this time. No fevers, chills, nausea, vomiting, or diarrhea. 03/14/2021 upon evaluation today patient appears to be doing well with regard to her wound. There is a little bit of a limp on the plantar edge of the wound opening which I Minna see about trying to clear away today. Fortunately I think other than this  everything seems to be showing signs of improvement measurements still are roughly the same. It is definitely not as deep however. 03/28/2021 upon evaluation today patient appears to be doing well with regard to her wound. There is still some slough noted in the base of the wound. Fortunately there does not appear to be any evidence of active infection locally nor systemically at this time which is good news. 04/11/2021 upon evaluation today patient appears to be doing decently well in regard to her foot ulcer. This is showing a little bit of moisture buildup but fortunately nothing too significant I think the wound is looking better. This is just very slow to heal. Fortunately I do not see any evidence of active infection at this time. 04/18/2021 upon evaluation today patient appears to be doing well with regard to her ulcer. In fact this is getting very close to completely resolved. I am actually extremely pleased with where things stand today and how this appears. Electronic Signature(s) Signed: 04/18/2021 1:57:51 PM By: Worthy Keeler PA-C Entered By: Worthy Keeler on 04/18/2021 13:57:51 [name] [name], [name] ([id number]) -------------------------------------------------------------------------------- Physical Exam Details Patient Name: [name] [name], [name] Date of Service: 04/18/2021 1:15 PM Medical Record Number: [id number] Patient Account Number: [id number] Date of Birth/Sex: 12-Mar-1926 (88 y.o. F) Treating RN: Carlene Coria Primary Care Provider: [name] Other Clinician: Referring Provider: [name] Treating Provider/Extender: Skipper Cliche in Treatment: 60 Constitutional Well-nourished and well-hydrated in no acute distress. Respiratory normal breathing without difficulty. Psychiatric this patient is able to make decisions and demonstrates good insight into disease process. Alert and Oriented x 3. pleasant and cooperative. Notes Fortunately there does not appear to be any  evidence of active infection locally or systemically which is great news and overall I am extremely pleased with where we stand. No fevers, chills, nausea, vomiting, or diarrhea. Electronic Signature(s) Signed: 04/18/2021 1:58:09 PM By:  Melburn Hake, Xavyer Steenson PA-C Entered By: Worthy Keeler on 04/18/2021 13:58:08 [name] [name], [name] ([id number]) -------------------------------------------------------------------------------- Physician Orders Details Patient Name: Olesen, Zarina L. Date of Service: 04/18/2021 1:15 PM Medical Record Number: [id number] Patient Account Number: [id number] Date of Birth/Sex: [birth year] (88 y.o. F) Treating RN: Carlene Coria Primary Care Provider: [name] Other Clinician: Referring Provider: [name] Treating Provider/Extender: Skipper Cliche in Treatment: 38 Verbal / Phone Orders: No Diagnosis Coding ICD-10 Coding Code Description [phone] Pressure ulcer of other site, stage 3 I10 Essential (primary) hypertension F01.50 Vascular dementia without behavioral disturbance I25.10 Atherosclerotic heart disease of native coronary artery without angina pectoris Follow-up Appointments o Return Appointment in 2 weeks. Bathing/ Shower/ Hygiene o May shower with wound dressing protected with water repellent cover or cast protector. o No tub bath. Anesthetic (Use 'Patient Medications' Section for Anesthetic Order Entry) o Lidocaine applied to wound bed Edema Control - Lymphedema / Segmental Compressive Device / Other o Optional: One layer of unna paste to top of compression wrap (to act as an anchor). o Tubigrip single layer applied. - left leg size D o Patient to wear own compression stockings. Remove compression stockings every night before going to bed and put on every morning when getting up. - right leg o Elevate legs to the level of the heart and pump ankles as often as possible o Elevate leg(s) parallel to the floor when sitting. o DO  YOUR BEST to sleep in the bed at night. DO NOT sleep in your recliner. Long hours of sitting in a recliner leads to swelling of the legs and/or potential wounds on your backside. Off-Loading o Other: - Offloading boots while in bed; Try pillow under sheet to keep it in place. Additional Orders / Instructions o Follow Nutritious Diet and Increase Protein Intake Wound Treatment Wound #3 - Foot Wound Laterality: Left Cleanser: Soap and Water 2 x Per Week/30 Days Discharge Instructions: Gently cleanse wound with antibacterial soap, rinse and pat dry prior to dressing wounds Primary Dressing: Hydrofera Blue Ready Transfer Foam, 2.5x2.5 (in/in) 2 x Per Week/30 Days Discharge Instructions: ONY ON TOP of wound only-Apply Hydrofera Blue Ready to wound bed as directed Secondary Dressing: Zetuvit Plus Silicone Border Dressing 4x4 (in/in) 2 x Per Week/30 Days Secured With: Tubigrip Size D, 3x10 (in/yd) 2 x Per Week/30 Days Electronic Signature(s) Signed: 04/18/2021 5:22:07 PM By: Worthy Keeler PA-C Signed: 04/19/2021 1:46:31 PM By: Carlene Coria RN Entered By: Carlene Coria on 04/18/2021 13:49:53 [name] [name], [name] ([id number]) -------------------------------------------------------------------------------- Problem List Details Patient Name: [name] [name], [name] Date of Service: 04/18/2021 1:15 PM Medical Record Number: [id number] Patient Account Number: [id number] Date of Birth/Sex: [birth year] (88 y.o. F) Treating RN: Carlene Coria Primary Care Provider: [name] Other Clinician: Referring Provider: [name] Treating Provider/Extender: Skipper Cliche in Treatment: 42 Active Problems ICD-10 Encounter Code Description Active Date MDM Diagnosis [id number] Pressure ulcer of other site, stage 3 10/02/2020 No Yes I10 Essential (primary) hypertension 06/26/2020 No Yes F01.50 Vascular dementia without behavioral disturbance 06/26/2020 No Yes I25.10 Atherosclerotic heart disease of native coronary  artery without angina 06/26/2020 No Yes pectoris Inactive Problems Resolved Problems ICD-10 Code Description Active Date Resolved Date L89.623 Pressure ulcer of left heel, stage 3 06/26/2020 06/26/2020 Electronic Signature(s) Signed: 04/18/2021 1:20:21 PM By: Worthy Keeler PA-C Entered By: Worthy Keeler on 04/18/2021 13:20:21 [name] [name], [name] [name] ([id number]) -------------------------------------------------------------------------------- Progress Note Details Patient Name: [name] [name], [name] Date of Service: 04/18/2021 1:15 PM Medical Record Number: [id number] Patient Account Number:  [id number] Date of Birth/Sex: [birth year] (88 y.o. F) Treating RN: Carlene Coria Primary Care Provider: [name] Other Clinician: Referring Provider: [name] Treating Provider/Extender: Skipper Cliche in Treatment: 42 Subjective Chief Complaint Information obtained from Patient Left Heel Pressure Ulcer History of Present Illness (HPI) 88 year old patient was recently seen by the PCPs office for significant pain right great toe which has been going on since July. She was initially treated with Keflex which she did not complete and after the office visit this time she has been put on doxycycline. at the time of her visit she was found to have a ulcer on the plantar surface of the right great toe and also had a pyogenic granuloma over this area. X-ray of the right foot done 12/29/2014 -- IMPRESSION:Soft-tissue swelling and ulceration right great toe. No underlying bony lytic lesion identified. If osteomyelitis remains of clinical concern MRI can be obtained. Past medical history significant for anemia, chronic kidney disease stage III, obesity, varicose veins, coronary artery disease, gout, history of nicotine addiction given up smoking in 2002, hypertension, status post cardiac catheterization, status post abdominal hysterectomy, cholecystectomy and tonsillectomy. hemoglobin A1c done in August was  5.8 01/22/2015 -- at this stage the [facility] Walking boat was going to cost them significant amount of money and they want to defer using that at the present time. 01/29/2015 -- she had a podiatry appointment and they have trimmed her toenails. She has not heard back from the vascular office regarding her venous duplex study and I have asked them to call personally so that they can get the appointment soon. 02/06/2015 -- they have made contact with the vascular office and from what I understand that test has been done but the report is pending. 02/19/2014 -- the vascular test is scheduled for tomorrow Readmission: 06/26/2020 upon evaluation today patient appears for initial evaluation in her clinic that she has been here before in 2016 and has been quite sometime. She did have a fractured hip in February on the 23rd 2022. Subsequently this had to be pinned and she ended up with a pressure injury on her heel following when she was using her foot to help move her around in the bed. Subsequently this has led to the wound that she has been dealing with since that time. She lives at home with her daughter currently she does have dementia. 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 07/03/2020 upon evaluation today patient appears to be doing well with regard to her heel ulcer. She did have arterial studies they appear to be doing excellent it was premature normal across the board with TBI's in the 90s and ABIs well within normal range. Nonetheless there does not appear to be any signs of arterial insufficiency whatsoever. With that being said the patient does have also signs of improvement there is some necrotic tissue in the base of the wound number to try to clear some of that away today I do believe the Iodoflex/Iodosorb is doing well. 5/24; difficult punched out wound on the  left medial heel. She has been using Iodoflex 07/17/2020 upon evaluation today patient appears to be doing well at this point in regard to her wound. I do feel like this is a little bit deeper but again that is what is expected as we continue with the Iodoflex I think that this is just going to get deeper until we  get to the base of the wound. With that being said I think we are getting much closer to the base of the wound where we can have healthier tissue that we get a be managing here which that will be awesome. In the meantime I am not surprised by what I am seeing and in fact the wound appears to be better as compared to previous findings. 07/24/2020 upon evaluation today patient appears to be doing well with regard to her wound. Overall I am extremely pleased with where things stand today. I do not see any signs of active infection which is great and overall I think that the patient is making good progress. There is good to be some need for sharp debridement today. 07/31/2020 upon evaluation today patient appears to be doing better in regard to her heel ulcer. She has been tolerating the dressing changes without complication. Fortunately there does not appear to be any signs of active infection which is great and overall very pleased in this regard. No fevers, chills, nausea, vomiting, or diarrhea. 08/14/2020 upon evaluation today patient appears to be doing better in regard to her heel ulcer. I am very pleased in that regard. Unfortunately she still has a slight deep tissue injury in regard to the medial portion of her foot over the same area. Unfortunately I think this is something that if were not careful it was can open up into the wound. That is my main concern here based on what I see. Fortunately there does not appear to be any evidence of active infection which is great news and overall very pleased with where things stand at this point. 08/21/2020 upon evaluation today patient appears to be  doing well with regard to her wound. She has been tolerating the dressing changes without complication. Fortunately there does not appear to be any signs of active infection at this time. No fevers, chills, nausea, vomiting, or diarrhea. I do believe the compression wrap was beneficial for her. 08/28/2020 upon evaluation today patient appears to be doing well with regard to her wounds currently. Fortunately there does not appear to be any signs of active infection at this time. No fevers, chills, nausea, vomiting, or diarrhea. With that being said I think that her leg is doing quite well to be honest. Bodmer, Krizia L. ([id number]) 09/04/2020 upon evaluation today patient appears to be doing well with regard to her wound on the heel. This is showing signs of good epithelial growth although there is probably can it be an indention where this heals that is okay as long as we get it closed. Fortunately I do not see any evidence of infection at this point. 09/13/2020 upon evaluation today patient appears to be doing well with regard to her wounds. She has been tolerating the dressing changes without complication. Fortunately he is actually doing extremely well and I think she is making great progress this is measuring smaller. I would recommend that such that we continue with the [facility] likely since he is doing so well. 09/18/2020 upon evaluation today patient appears to be doing well with regard to her heel ulcer. She is making good progress and I am very pleased with what we see today. I think the Hydrofera Blue is still doing excellent. 10/02/2020 upon evaluation today patient's wound actually appears to be showing signs of good improvement in regard to the heel. I am very pleased in this regard. With that being said I do believe that the area which is somewhat  been stable and dry is starting to lift up and beginning to clear this away as well that is on the foot. Subsequently I am going to have to  perform some debridement here and we did actually go ahead and allow this as a wound today as before it was just more deep tissue injury and eschar covering now I think it is a little bit more than that to be honest. 10/09/2020 upon evaluation today patient appears to be doing decently well in regard to her wounds. I am actually very pleased with where things stand today. There does not appear to be any signs of active infection which is great news. No fevers, chills, nausea, vomiting, or diarrhea. She is going require some sharp debridement today. 10/16/2020 upon evaluation today patient appears to be doing decently well in regard to her heel as well as the foot. Both are showing signs of good improvement which is great news. In general and extremely pleased with where things stand at this point. She is tolerating the [facility] well although this is getting so small in the heel I think collagen may be a better option here based on what I am seeing. With regard to the foot the Iodoflex/Iodosorb is still probably the best method here. 10/26/2020 upon evaluation today patient actually appears to be doing well in some respects today. She has been tolerating the dressing changes without complication and this is great news. The Hydrofera Blue is done well for the heel this actually appears to be healed today. With that being said in regard to the foot I think we are ready to switch to [facility] based on what I see at this point. 10/30/2020 upon evaluation today patient appears to be doing well with regard to his wound. He has been tolerating the dressing changes without complication. Fortunately there does not appear to be any signs of active infection systemically at this time which is great news and overall very pleased with where things stand today. I do think that she is making progress although it somewhat slow still where showing signs of improvement little by little here. 11/06/2020 upon  evaluation today patient appears to be doing decently well in regard to her wound. We will start and see better overall appearance of the base of the wound which is great news she still continues to have some abnormal fluorescence signals with a MolecuLight DX that will be detailed below. 11/20/2020 upon evaluation today patient's wound actually showing signs of improvement. There is good to be some need for sharp debridement today and that was discussed with the patient. I am going to go ahead and clean this up but I do believe the [facility] is doing a great job. 11/27/2020; the patient had eschar over the original heel wound which I removed with a #3 curette there was nothing open here. The area is on the lateral left foot foot. 12/04/2020 upon evaluation today patient appears to be doing well with regard to her wound. The wound bed actually appeared to be doing well after I remove the dry endoform from the wound bed. This I tracked little bit of fluid but nonetheless underneath this appears to be doing awesome. I am very pleased with where things stand today. No fevers, chills, nausea, vomiting, or diarrhea. 12/11/2020 upon evaluation today patient appears to be doing well with regard to her wound although it keeps getting dry and filled up with the endoform I am not seeing any signs of infection  but we are going to have to clean this away in order to try and get things moving in an appropriate direction. I discussed that with patient's daughter today as well. She is in agreement with proceeding as such. 12/21/2020 upon evaluation today patient appears to be doing well with regard to her wound this did not seal off like it was with the collagen but nonetheless also did not really feeling quite as much as I would like to have seen. I do believe that we may need to go ahead and see about doing a debridement today and I really feel like we may want to switch back to the [facility] which I felt  like was doing better in the past. Good news is the base of the wound appears healthy with good granulation tissue. 12/28/2020 upon evaluation patient's wound actually showed signs of doing quite well in regard to her foot. I think that we getting very close to complete resolution and overall I am extremely happy with where things stand today. 01/04/2021 upon evaluation today patient appears to be doing well with regard to her wound on the foot. This is good to require little bit of debridement today but overall seems to be doing quite well. I am actually very pleased with where we stand currently. 01/08/2021 upon evaluation today patient's wound actually showing signs of some improvement although she still has some callus buildup around the edges of the wound unfortunately I think this is continuing to rub despite what we are doing currently. I think that we may need to go ahead and see about switching up the dressing a little bit I Minna recommend collagen and then on top of the collagen we will get a use some foam double layered cut in a doughnut to try to take pressure off of this area we will see how this does over the next week. 01/15/2021 upon evaluation today patient's wound is actually showing signs of being completely dried over with the collagen. This is definitely not what we are looking for. I think that the wound dried out too quickly is the main issue that we are running into at this point and I really think we may need to do something else try to keep that from occurring. 01/22/2021 upon evaluation today patient's wound actually did stay open it did not callus over as its been doing in the past this is good news. Also feel like it may not be quite as deep as what we have previously seen. There is still not as much improvement he does what I would like to see but nonetheless I think we are headed in the right direction. 01/29/2021 upon evaluation today patient appears to be doing okay  in regard to her wound is not too dry but unfortunately is too wet the Xeroform just did not do what I was hoping that she made things a little bit worse as far as size wise I am actually going to try at this point considering this is more open but not as deep the Hydrofera Blue to see if this will be beneficial at this time. Again we seem to be cycling through things and it is becoming a little difficult to get this to completely close but in the past Hydrofera Blue is what she did the best with 1 week to keep [name] [name], [name] ([id number]) it from closing up prematurely. 02/05/2021 upon evaluation today patient's wound actually showing some signs of improvement which is great news. Fortunately  I do not see any evidence of active infection locally nor systemically. It is definitely not too wet which is been the biggest issue we have seen up to this point over the past several weeks. 02/21/2021 upon evaluation today patient appears to be doing excellent in regard to her wound. This actually appears much improved compared to last time I saw her which is great news as well. No fevers, chills, nausea, vomiting, or diarrhea. 02/28/2021 upon evaluation today patient's wound on her foot actually appears to be doing quite well. I am very pleased with where things stand today. I do not see any evidence of active infection locally nor systemically at this time. No fevers, chills, nausea, vomiting, or diarrhea. 03/14/2021 upon evaluation today patient appears to be doing well with regard to her wound. There is a little bit of a limp on the plantar edge of the wound opening which I Minna see about trying to clear away today. Fortunately I think other than this everything seems to be showing signs of improvement measurements still are roughly the same. It is definitely not as deep however. 03/28/2021 upon evaluation today patient appears to be doing well with regard to her wound. There is still some slough noted in  the base of the wound. Fortunately there does not appear to be any evidence of active infection locally nor systemically at this time which is good news. 04/11/2021 upon evaluation today patient appears to be doing decently well in regard to her foot ulcer. This is showing a little bit of moisture buildup but fortunately nothing too significant I think the wound is looking better. This is just very slow to heal. Fortunately I do not see any evidence of active infection at this time. 04/18/2021 upon evaluation today patient appears to be doing well with regard to her ulcer. In fact this is getting very close to completely resolved. I am actually extremely pleased with where things stand today and how this appears. Objective Constitutional Well-nourished and well-hydrated in no acute distress. Vitals Time Taken: 1:14 PM, Height: 58 in, Weight: 137 lbs, BMI: 28.6, Temperature: 98 F, Pulse: 69 bpm, Respiratory Rate: 18 breaths/min, Blood Pressure: 118/65 mmHg. Respiratory normal breathing without difficulty. Psychiatric this patient is able to make decisions and demonstrates good insight into disease process. Alert and Oriented x 3. pleasant and cooperative. General Notes: Fortunately there does not appear to be any evidence of active infection locally or systemically which is great news and overall I am extremely pleased with where we stand. No fevers, chills, nausea, vomiting, or diarrhea. Integumentary (Hair, Skin) Wound #3 status is Open. Original cause of wound was Pressure Injury. The date acquired was: 10/02/2020. The wound has been in treatment 28 weeks. The wound is located on the Left Foot. The wound measures 0.2cm length x 0.3cm width x 0.2cm depth; 0.047cm^2 area and 0.009cm^3 volume. There is Fat Layer (Subcutaneous Tissue) exposed. There is no tunneling or undermining noted. There is a medium amount of serous drainage noted. The wound margin is thickened. There is large (67-100%) pale  granulation within the wound bed. There is a small (1-33%) amount of necrotic tissue within the wound bed including Adherent Slough. Assessment Active Problems ICD-10 Pressure ulcer of other site, stage 3 Essential (primary) hypertension Vascular dementia without behavioral disturbance Atherosclerotic heart disease of native coronary artery without angina pectoris [name] [name], [name] ([id number]) Plan Follow-up Appointments: Return Appointment in 2 weeks. Bathing/ Shower/ Hygiene: May shower with wound dressing protected with water repellent  cover or cast protector. No tub bath. Anesthetic (Use 'Patient Medications' Section for Anesthetic Order Entry): Lidocaine applied to wound bed Edema Control - Lymphedema / Segmental Compressive Device / Other: Optional: One layer of unna paste to top of compression wrap (to act as an anchor). Tubigrip single layer applied. - left leg size D Patient to wear own compression stockings. Remove compression stockings every night before going to bed and put on every morning when getting up. - right leg Elevate legs to the level of the heart and pump ankles as often as possible Elevate leg(s) parallel to the floor when sitting. DO YOUR BEST to sleep in the bed at night. DO NOT sleep in your recliner. Long hours of sitting in a recliner leads to swelling of the legs and/or potential wounds on your backside. Off-Loading: Other: - Offloading boots while in bed; Try pillow under sheet to keep it in place. Additional Orders / Instructions: Follow Nutritious Diet and Increase Protein Intake WOUND #3: - Foot Wound Laterality: Left Cleanser: Soap and Water 2 x Per Week/30 Days Discharge Instructions: Gently cleanse wound with antibacterial soap, rinse and pat dry prior to dressing wounds Primary Dressing: Hydrofera Blue Ready Transfer Foam, 2.5x2.5 (in/in) 2 x Per Week/30 Days Discharge Instructions: ONY ON TOP of wound only-Apply Hydrofera Blue Ready to wound  bed as directed Secondary Dressing: Zetuvit Plus Silicone Border Dressing 4x4 (in/in) 2 x Per Week/30 Days Secured With: Tubigrip Size D, 3x10 (in/yd) 2 x Per Week/30 Days 1. Would recommend that we continue with the [facility] as I feel like that is doing an awesome job. 2. We will get a continue with the border foam dressing to cover as well. 3. I am also going to suggest that we have the patient continue to monitor for any signs of worsening or infection. We will see patient back for reevaluation in 2 weeks here in the clinic. If anything worsens or changes patient will contact our office for additional recommendations. This is due to the fact that I am out of the office next week and her daughter states she is able to change the dressings and does not need to come in for nurse visit. Electronic Signature(s) Signed: 04/18/2021 1:59:05 PM By: Worthy Keeler PA-C Entered By: Worthy Keeler on 04/18/2021 13:59:05 Hucker, Rindy L. ([id number]) -------------------------------------------------------------------------------- SuperBill Details Patient Name: Totherow, Rema L. Date of Service: 04/18/2021 Medical Record Number: [id number] Patient Account Number: [id number] Date of Birth/Sex: 11-04-1926 (88 y.o. F) Treating RN: Carlene Coria Primary Care Provider: [name] Other Clinician: Referring Provider: [name] Treating Provider/Extender: Skipper Cliche in Treatment: 42 Diagnosis Coding ICD-10 Codes Code Description [phone] Pressure ulcer of other site, stage 3 I10 Essential (primary) hypertension I25.10 Atherosclerotic heart disease of native coronary artery without angina pectoris Facility Procedures CPT4 Code: [id number] Description: [phone] - WOUND CARE VISIT-LEV 2 EST PT Modifier: Quantity: 1 Physician Procedures CPT4 Code: [id number] Description: 99214 - WC PHYS LEVEL 4 - EST PT Modifier: Quantity: 1 CPT4 Code: Description: ICD-10 Diagnosis Description L89.893  Pressure ulcer of other site, stage 3 I10 Essential (primary) hypertension I25.10 Atherosclerotic heart disease of native coronary artery without angina Modifier: pectoris Quantity: Electronic Signature(s) Signed: 04/18/2021 2:03:44 PM By: Worthy Keeler PA-C Entered By: Worthy Keeler on 04/18/2021 14:03:44

## 2021-04-19 NOTE — Progress Notes (Signed)
Chea, Gwynn L. ([id number]) Visit Report for 04/18/2021 Arrival Information Details Patient Name: [name] [name] SCHALL. Date of Service: 04/18/2021 1:15 PM Medical Record Number: [id number] Patient Account Number: [id number] Date of Birth/Sex: 20-May-1926 (88 y.o. F) Treating RN: Carlene Coria Primary Care Amyrie Illingworth: [name] Other Clinician: Referring Ronelle Smallman: [name] Treating Elzabeth Mcquerry/Extender: Skipper Cliche in Treatment: 79 Visit Information History Since Last Visit All ordered tests and consults were completed: No Patient Arrived: Wheel Chair Added or deleted any medications: No Arrival Time: 13:10 Any new allergies or adverse reactions: No Accompanied By: daughter Had a fall or experienced change in No Transfer Assistance: None activities of daily living that may affect Patient Identification Verified: Yes risk of falls: Secondary Verification Process Completed: Yes Signs or symptoms of abuse/neglect since last visito No Patient Requires Transmission-Based No Hospitalized since last visit: No Precautions: Implantable device outside of the clinic excluding No Patient Has Alerts: Yes cellular tissue based products placed in the center Patient Alerts: NOT diabetic since last visit: ABI R1.18 L1.25 Has Dressing in Place as Prescribed: Yes 06/27/20 Pain Present Now: No Electronic Signature(s) Signed: 04/19/2021 1:46:31 PM By: Carlene Coria RN Entered By: Carlene Coria on 04/18/2021 13:14:23 [name] [name] L. ([id number]) -------------------------------------------------------------------------------- Clinic Level of Care Assessment Details Patient Name: [name] [name] L. Date of Service: 04/18/2021 1:15 PM Medical Record Number: [id number] Patient Account Number: [id number] Date of Birth/Sex: [birth year] (88 y.o. F) Treating RN: Carlene Coria Primary Care Shaleigh Laubscher: [name] Other Clinician: Referring Kaileb Monsanto: [name] Treating Nickalus Thornsberry/Extender: Skipper Cliche in Treatment: 42 Clinic Level of Care Assessment Items TOOL 4 Quantity Score X - Use when only an EandM is performed on FOLLOW-UP visit 1 0 ASSESSMENTS - Nursing Assessment / Reassessment X - Reassessment of Co-morbidities (includes updates in patient status) 1 10 X- 1 5 Reassessment of Adherence to Treatment Plan ASSESSMENTS - Wound and Skin Assessment / Reassessment X - Simple Wound Assessment / Reassessment - one wound 1 5 []  - 0 Complex Wound Assessment / Reassessment - multiple wounds []  - 0 Dermatologic / Skin Assessment (not related to wound area) ASSESSMENTS - Focused Assessment []  - Circumferential Edema Measurements - multi extremities 0 []  - 0 Nutritional Assessment / Counseling / Intervention []  - 0 Lower Extremity Assessment (monofilament, tuning fork, pulses) []  - 0 Peripheral Arterial Disease Assessment (using hand held doppler) ASSESSMENTS - Ostomy and/or Continence Assessment and Care []  - Incontinence Assessment and Management 0 []  - 0 Ostomy Care Assessment and Management (repouching, etc.) PROCESS - Coordination of Care X - Simple Patient / Family Education for ongoing care 1 15 []  - 0 Complex (extensive) Patient / Family Education for ongoing care []  - 0 Staff obtains Programmer, systems, Records, Test Results / Process Orders []  - 0 Staff telephones HHA, Nursing Homes / Clarify orders / etc []  - 0 Routine Transfer to another Facility (non-emergent condition) []  - 0 Routine Hospital Admission (non-emergent condition) []  - 0 New Admissions / Biomedical engineer / Ordering NPWT, Apligraf, etc. []  - 0 Emergency Hospital Admission (emergent condition) X- 1 10 Simple Discharge Coordination []  - 0 Complex (extensive) Discharge Coordination PROCESS - Special Needs []  - Pediatric / Minor Patient Management 0 []  - 0 Isolation Patient Management []  - 0 Hearing / Language / Visual special needs []  - 0 Assessment of Community assistance  (transportation, D/C planning, etc.) []  - 0 Additional assistance / Altered mentation []  - 0 Support Surface(s) Assessment (bed, cushion, seat, etc.) INTERVENTIONS - Wound Cleansing / Measurement [name] [name]  L. ([id number]) X- 1 5 Simple Wound Cleansing - one wound []  - 0 Complex Wound Cleansing - multiple wounds X- 1 5 Wound Imaging (photographs - any number of wounds) []  - 0 Wound Tracing (instead of photographs) X- 1 5 Simple Wound Measurement - one wound []  - 0 Complex Wound Measurement - multiple wounds INTERVENTIONS - Wound Dressings X - Small Wound Dressing one or multiple wounds 1 10 []  - 0 Medium Wound Dressing one or multiple wounds []  - 0 Large Wound Dressing one or multiple wounds []  - 0 Application of Medications - topical []  - 0 Application of Medications - injection INTERVENTIONS - Miscellaneous []  - External ear exam 0 []  - 0 Specimen Collection (cultures, biopsies, blood, body fluids, etc.) []  - 0 Specimen(s) / Culture(s) sent or taken to Lab for analysis []  - 0 Patient Transfer (multiple staff / Civil Service fast streamer / Similar devices) []  - 0 Simple Staple / Suture removal (25 or less) []  - 0 Complex Staple / Suture removal (26 or more) []  - 0 Hypo / Hyperglycemic Management (close monitor of Blood Glucose) []  - 0 Ankle / Brachial Index (ABI) - do not check if billed separately X- 1 5 Vital Signs Has the patient been seen at the hospital within the last three years: Yes Total Score: 75 Level Of Care: New/Established - Level 2 Electronic Signature(s) Signed: 04/19/2021 1:46:31 PM By: Carlene Coria RN Entered By: Carlene Coria on 04/18/2021 13:50:24 [name] [name] L. ([id number]) -------------------------------------------------------------------------------- Encounter Discharge Information Details Patient Name: [name] [name] [name] L. Date of Service: 04/18/2021 1:15 PM Medical Record Number: [id number] Patient Account Number: [id number] Date of Birth/Sex:  04-Oct-1926 (88 y.o. F) Treating RN: Carlene Coria Primary Care Lylla Eifler: [name] Other Clinician: Referring Sydney Hasten: [name] Treating Nishat Livingston/Extender: Skipper Cliche in Treatment: 13 Encounter Discharge Information Items Discharge Condition: Stable Ambulatory Status: Wheelchair Discharge Destination: Home Transportation: Private Auto Accompanied By: daughter Schedule Follow-up Appointment: Yes Clinical Summary of Care: Patient Declined Electronic Signature(s) Signed: 04/19/2021 1:46:31 PM By: Carlene Coria RN Entered By: Carlene Coria on 04/18/2021 13:52:20 [name] [name] L. ([id number]) -------------------------------------------------------------------------------- Lower Extremity Assessment Details Patient Name: [name] [name] L. Date of Service: 04/18/2021 1:15 PM Medical Record Number: [id number] Patient Account Number: [id number] Date of Birth/Sex: 1926/12/16 (88 y.o. F) Treating RN: Carlene Coria Primary Care Tran Arzuaga: [name] Other Clinician: Referring Yasmene Salomone: [name] Treating Khole Arterburn/Extender: Skipper Cliche in Treatment: 42 Vascular Assessment Pulses: Dorsalis Pedis Palpable: [Left:Yes] Electronic Signature(s) Signed: 04/19/2021 1:46:31 PM By: Carlene Coria RN Entered By: Carlene Coria on 04/18/2021 13:17:00 [name] [name] L. ([id number]) -------------------------------------------------------------------------------- Multi Wound Chart Details Patient Name: [name] [name] L. Date of Service: 04/18/2021 1:15 PM Medical Record Number: [id number] Patient Account Number: [id number] Date of Birth/Sex: 03/15/1926 (88 y.o. F) Treating RN: Carlene Coria Primary Care Radiance Deady: [name] Other Clinician: Referring Takiya Belmares: [name] Treating Amarrah Meinhart/Extender: Skipper Cliche in Treatment: 42 Vital Signs Height(in): 58 Pulse(bpm): 59 Weight(lbs): 137 Blood Pressure(mmHg): 118/65 Body Mass Index(BMI): 28.6 Temperature(F):  98 Respiratory Rate(breaths/min): 18 Photos: [N/A:N/A] Wound Location: Left Foot N/A N/A Wounding Event: Pressure Injury N/A N/A Primary Etiology: Pressure Ulcer N/A N/A Comorbid History: Cataracts, Anemia, Coronary Artery N/A N/A Disease, Hypertension, Myocardial Infarction, End Stage Renal Disease, Gout, Osteoarthritis, Dementia Date Acquired: 10/02/2020 N/A N/A Weeks of Treatment: 28 N/A N/A Wound Status: Open N/A N/A Wound Recurrence: No N/A N/A Measurements L x W x D (cm) 0.2x0.3x0.2 N/A N/A Area (cm) : 0.047 N/A N/A Volume (cm) : 0.009 N/A N/A % Reduction in  Area: 85.00% N/A N/A % Reduction in Volume: 71.00% N/A N/A Classification: Category/Stage III N/A N/A Exudate Amount: Medium N/A N/A Exudate Type: Serous N/A N/A Exudate Color: amber N/A N/A Wound Margin: Thickened N/A N/A Granulation Amount: Large (67-100%) N/A N/A Granulation Quality: Pale N/A N/A Necrotic Amount: Small (1-33%) N/A N/A Exposed Structures: Fat Layer (Subcutaneous Tissue): N/A N/A Yes Fascia: No Tendon: No Muscle: No Joint: No Bone: No Epithelialization: Small (1-33%) N/A N/A Treatment Notes Electronic Signature(s) Signed: 04/19/2021 1:46:31 PM By: Carlene Coria RN Entered By: Carlene Coria on 04/18/2021 13:17:27 Appelhans, Neeta L. ([id number]) Muhl, Rosilyn L. ([id number]) -------------------------------------------------------------------------------- [place] Details Patient Name: [name] [name] L. Date of Service: 04/18/2021 1:15 PM Medical Record Number: [id number] Patient Account Number: [id number] Date of Birth/Sex: May 08, 1926 (88 y.o. F) Treating RN: Carlene Coria Primary Care Sylis Ketchum: [name] Other Clinician: Referring Allyna Pittsley: [name] Treating Ronia Hazelett/Extender: Skipper Cliche in Treatment: 17 Active Inactive Electronic Signature(s) Signed: 04/19/2021 1:46:31 PM By: Carlene Coria RN Entered By: Carlene Coria on 04/18/2021 13:17:12 [name],  [name] L. ([id number]) -------------------------------------------------------------------------------- Pain Assessment Details Patient Name: [name] [name] L. Date of Service: 04/18/2021 1:15 PM Medical Record Number: [id number] Patient Account Number: [id number] Date of Birth/Sex: [birth year] (88 y.o. F) Treating RN: Carlene Coria Primary Care Walda Hertzog: [name] Other Clinician: Referring Cynde Menard: [name] Treating Jniya Madara/Extender: Skipper Cliche in Treatment: 42 Active Problems Location of Pain Severity and Description of Pain Patient Has Paino No Site Locations Pain Management and Medication Current Pain Management: Electronic Signature(s) Signed: 04/19/2021 1:46:31 PM By: Carlene Coria RN Entered By: Carlene Coria on 04/18/2021 13:14:49 Mrozek, [name] L. ([id number]) -------------------------------------------------------------------------------- Patient/Caregiver Education Details Patient Name: Messenger, Loralei L. Date of Service: 04/18/2021 1:15 PM Medical Record Number: [id number] Patient Account Number: [id number] Date of Birth/Gender: [birth year] (88 y.o. F) Treating RN: Carlene Coria Primary Care Physician: [name] Other Clinician: Referring Physician: [name] Treating Physician/Extender: Skipper Cliche in Treatment: 21 Education Assessment Education Provided To: Patient Education Topics Provided Wound/Skin Impairment: Methods: Explain/Verbal Responses: State content correctly Electronic Signature(s) Signed: 04/19/2021 1:46:31 PM By: Carlene Coria RN Entered By: Carlene Coria on 04/18/2021 13:50:50 Sentell, Jarvis L. ([id number]) -------------------------------------------------------------------------------- Wound Assessment Details Patient Name: Marrazzo, Malayla L. Date of Service: 04/18/2021 1:15 PM Medical Record Number: [id number] Patient Account Number: [id number] Date of Birth/Sex: 20-Jun-1926 (88 y.o. F) Treating RN: Carlene Coria Primary  Care Javaris Wigington: [name] Other Clinician: Referring Kandas Oliveto: [name] Treating Joshuan Bolander/Extender: Skipper Cliche in Treatment: 42 Wound Status Wound Number: 3 Primary Pressure Ulcer Etiology: Wound Location: Left Foot Wound Open Wounding Event: Pressure Injury Status: Date Acquired: 10/02/2020 Comorbid Cataracts, Anemia, Coronary Artery Disease, Weeks Of Treatment: 28 History: Hypertension, Myocardial Infarction, End Stage Renal Clustered Wound: No Disease, Gout, Osteoarthritis, Dementia Photos Wound Measurements Length: (cm) 0.2 Width: (cm) 0.3 Depth: (cm) 0.2 Area: (cm) 0.047 Volume: (cm) 0.009 % Reduction in Area: 85% % Reduction in Volume: 71% Epithelialization: Small (1-33%) Tunneling: No Undermining: No Wound Description Classification: Category/Stage III Wound Margin: Thickened Exudate Amount: Medium Exudate Type: Serous Exudate Color: amber Foul Odor After Cleansing: No Slough/Fibrino Yes Wound Bed Granulation Amount: Large (67-100%) Exposed Structure Granulation Quality: Pale Fascia Exposed: No Necrotic Amount: Small (1-33%) Fat Layer (Subcutaneous Tissue) Exposed: Yes Necrotic Quality: Adherent Slough Tendon Exposed: No Muscle Exposed: No Joint Exposed: No Bone Exposed: No Treatment Notes Wound #3 (Foot) Wound Laterality: Left Cleanser Soap and Water Discharge Instruction: Gently cleanse wound with antibacterial soap, rinse and pat dry prior to dressing wounds Crounse, Kanetra  L. ([id number]) Peri-Wound Care Topical Primary Dressing Hydrofera Blue Ready Transfer Foam, 2.5x2.5 (in/in) Discharge Instruction: ONY ON TOP of wound only-Apply Hydrofera Blue Ready to wound bed as directed Secondary Dressing Zetuvit Plus Silicone Border Dressing 4x4 (in/in) Secured With Tubigrip Size D, 3x10 (in/yd) Compression Wrap Compression Stockings Add-Ons Electronic Signature(s) Signed: 04/19/2021 1:46:31 PM By: Carlene Coria RN Entered By: Carlene Coria on 04/18/2021 13:16:35 Dosanjh, Solangel L. ([id number]) -------------------------------------------------------------------------------- Vitals Details Patient Name: Aprea, Daryana L. Date of Service: 04/18/2021 1:15 PM Medical Record Number: [id number] Patient Account Number: [id number] Date of Birth/Sex: 19-Jan-1927 (88 y.o. F) Treating RN: Carlene Coria Primary Care Nohemy Koop: [name] Other Clinician: Referring Kao Conry: [name] Treating Ruven Corradi/Extender: Skipper Cliche in Treatment: 42 Vital Signs Time Taken: 13:14 Temperature (F): 98 Height (in): 58 Pulse (bpm): 69 Weight (lbs): 137 Respiratory Rate (breaths/min): 18 Body Mass Index (BMI): 28.6 Blood Pressure (mmHg): 118/65 Reference Range: 80 - 120 mg / dl Electronic Signature(s) Signed: 04/19/2021 1:46:31 PM By: Carlene Coria RN Entered By: Carlene Coria on 04/18/2021 13:14:41

## 2021-05-02 ENCOUNTER — Encounter: Payer: PPO | Admitting: Physician Assistant

## 2021-05-02 ENCOUNTER — Other Ambulatory Visit: Payer: Self-pay

## 2021-05-02 ENCOUNTER — Encounter: Payer: Self-pay | Admitting: Family Medicine

## 2021-05-02 DIAGNOSIS — R0989 Other specified symptoms and signs involving the circulatory and respiratory systems: Secondary | ICD-10-CM

## 2021-05-02 DIAGNOSIS — L89893 Pressure ulcer of other site, stage 3: Secondary | ICD-10-CM | POA: Diagnosis not present

## 2021-05-02 DIAGNOSIS — L97511 Non-pressure chronic ulcer of other part of right foot limited to breakdown of skin: Secondary | ICD-10-CM

## 2021-05-02 DIAGNOSIS — R531 Weakness: Secondary | ICD-10-CM

## 2021-05-02 DIAGNOSIS — R600 Localized edema: Secondary | ICD-10-CM

## 2021-05-02 NOTE — Progress Notes (Addendum)
Matsumura, Evalene L. ([id number]) ?Visit Report for 05/02/2021 ?Chief Complaint Document Details ?Patient Name: [name] [name], [name] [name] ?Date of Service: 05/02/2021 1:00 PM ?Medical Record Number: [id number] ?Patient Account Number: [id number] ?Date of Birth/Sex: 07-25-1926 (88 y.o. F) ?Treating RN: Donnamarie Poag ?Primary Care Provider: [name] Other Clinician: ?Referring Provider: [name] ?Treating Provider/Extender: [name] [name] ?Weeks in Treatment: 44 ?Information Obtained from: Patient ?Chief Complaint ?Left Heel Pressure Ulcer ?Electronic Signature(s) ?Signed: 05/02/2021 1:27:09 PM By: Worthy Keeler PA-C ?Entered By: Worthy Keeler on 05/02/2021 13:27:09 ?[name] [name], [name] ([id number]) ?-------------------------------------------------------------------------------- ?Debridement Details ?Patient Name: [name] [name], [name] [name] ?Date of Service: 05/02/2021 1:00 PM ?Medical Record Number: [id number] ?Patient Account Number: [id number] ?Date of Birth/Sex: [birth year] (88 y.o. F) ?Treating RN: Donnamarie Poag ?Primary Care Provider: [name] Other Clinician: ?Referring Provider: [name] ?Treating Provider/Extender: [name] [name] ?Weeks in Treatment: 44 ?Debridement Performed for ?Wound #3 Left Foot ?Assessment: ?Performed By: Physician [name] Sams., PA-C ?Debridement Type: Debridement ?Level of Consciousness (Pre- ?Awake and Alert ?procedure): ?Pre-procedure Verification/Time Out ?Yes - 13:47 ?Taken: ?Start Time: 13:48 ?Pain Control: Lidocaine ?Total Area Debrided (L x W): 0.4 (cm) x 0.6 (cm) = 0.24 (cm?) ?Tissue and other material ?Viable, Non-Viable, Slough, Subcutaneous, Skin: Dermis , Slough ?debrided: ?Level: Skin/Subcutaneous Tissue ?Debridement Description: Excisional ?Instrument: Curette ?Bleeding: Minimum ?Hemostasis Achieved: Pressure ?Response to Treatment: Procedure was tolerated well ?Level of Consciousness (Post- ?Awake and Alert ?procedure): ?Post Debridement Measurements of Total Wound ?Length: (cm)  0.4 ?Stage: Category/Stage III ?Width: (cm) 0.4 ?Depth: (cm) 0.4 ?Volume: (cm?) 0.05 ?Character of Wound/Ulcer Post Debridement: Improved ?Post Procedure Diagnosis ?Same as Pre-procedure ?Electronic Signature(s) ?Signed: 05/02/2021 3:44:29 PM By: Donnamarie Poag ?Signed: 05/03/2021 5:45:40 PM By: Worthy Keeler PA-C ?Entered ByDonnamarie Poag on 05/02/2021 13:50:54 ?[name] [name], [name] ([id number]) ?-------------------------------------------------------------------------------- ?HPI Details ?Patient Name: [name] [name], [name] [name] ?Date of Service: 05/02/2021 1:00 PM ?Medical Record Number: [id number] ?Patient Account Number: [id number] ?Date of Birth/Sex: [birth year] (88 y.o. F) ?Treating RN: Donnamarie Poag ?Primary Care Provider: [name] Other Clinician: ?Referring Provider: [name] ?Treating Provider/Extender: [name] [name] ?Weeks in Treatment: 44 ?History of Present Illness ?HPI Description: 88 year old patient was recently seen by the PCPs office for significant pain right great toe which has been going on since July. ?She was initially treated with Keflex which she did not complete and after the office visit this time she has been put on doxycycline. at the time of ?her visit she was found to have a ulcer on the plantar surface of the right great toe and also had a pyogenic granuloma over this area. ?X-ray of the right foot done 12/29/2014 -- IMPRESSION:Soft-tissue swelling and ulceration right great toe. No underlying ?bony lytic lesion identified. If osteomyelitis remains of clinical concern MRI can be obtained. ?Past medical history significant for anemia, chronic kidney disease stage III, obesity, varicose veins, coronary artery disease, gout, history of ?nicotine addiction given up smoking in 2002, hypertension, status post cardiac catheterization, status post abdominal hysterectomy, ?cholecystectomy and tonsillectomy. ?hemoglobin A1c done in August was 5.8 ?01/22/2015 -- at this stage the [facility] Walking boat was going  to cost them significant amount of money and they want to defer using that at the ?present time. ?01/29/2015 -- she had a podiatry appointment and they have trimmed her toenails. She has not heard back from the vascular office regarding her ?venous duplex study and I have asked them to call personally so that they can get the appointment soon. ?02/06/2015 -- they have made contact with the vascular office and from  what I understand that test has been done but the report is pending. ?02/19/2014 -- the vascular test is scheduled for tomorrow ?Readmission: ?06/26/2020 upon evaluation today patient appears for initial evaluation in her clinic that she has been here before in 2016 and has been quite ?sometime. She did have a fractured hip in February on the 23rd 2022. Subsequently this had to be pinned and she ended up with a pressure ?injury on her heel following when she was using her foot to help move her around in the bed. Subsequently this has led to the wound that she has ?been dealing with since that time. She lives at home with her daughter currently she does have dementia. 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 ?07/03/2020 upon evaluation today patient appears to be doing well with regard to her heel ulcer. She did have arterial studies they appear to be ?doing excellent it was premature normal across the board with TBI's in the 90s and ABIs well within normal range. Nonetheless there does not ?appear to be any signs of arterial insufficiency whatsoever. With that being said the patient does have also signs of improvement there is some ?necrotic tissue in the base of the wound number to try to clear some of that away today I do believe the Iodoflex/Iodosorb is doing well. ?5/24; difficult punched out wound on the left medial heel. She has been using Iodoflex ?07/17/2020 upon  evaluation today patient appears to be doing well at this point in regard to her wound. I do feel like this is a little bit deeper but ?again that is what is expected as we continue with the Iodoflex I think that this is just going to get deeper until we get to the base of the wound. ?With that being said I think we are getting much closer to the base of the wound where we can have healthier tissue that we get a be managing ?here which that will be awesome. In the meantime I am not surprised by what I am seeing and in fact the wound appears to be better as ?compared to previous findings. ?07/24/2020 upon evaluation today patient appears to be doing well with regard to her wound. Overall I am extremely pleased with where things ?stand today. I do not see any signs of active infection which is great and overall I think that the patient is making good progress. There is good to ?be some need for sharp debridement today. ?07/31/2020 upon evaluation today patient appears to be doing better in regard to her heel ulcer. She has been tolerating the dressing changes ?without complication. Fortunately there does not appear to be any signs of active infection which is great and overall very pleased in this regard. ?No fevers, chills, nausea, vomiting, or diarrhea. ?08/14/2020 upon evaluation today patient appears to be doing better in regard to her heel ulcer. I am very pleased in that regard. Unfortunately ?she still has a slight deep tissue injury in regard to the medial portion of her foot over the same area. Unfortunately I think this is something that ?if were not careful it was can open up into the wound. That is my main concern here based on what I see. Fortunately there does not appear to be ?any evidence of active infection which is great news and overall very pleased with where things stand at this point. ?08/21/2020 upon evaluation  today patient appears to be doing well with regard to her wound. She has been tolerating  the dressing changes without ?complication. Fortunately there does not appear to be any signs of active infection at this time. No fevers, chills, nausea, vomiting, or diarrhea. I ?do believe the compression

## 2021-05-02 NOTE — Progress Notes (Addendum)
Hosman, Kady L. ([id number]) ?Visit Report for 05/02/2021 ?Arrival Information Details ?Patient Name: [name] [name], [name] [name] ?Date of Service: 05/02/2021 1:00 PM ?Medical Record Number: [id number] ?Patient Account Number: [id number] ?Date of Birth/Sex: Feb 04, 1927 (88 y.o. F) ?Treating RN: Donnamarie Poag ?Primary Care Joana Nolton: [name] Other Clinician: ?Referring Arianna Haydon: [name] ?Treating Mahitha Hickling/Extender: Jeri Cos ?Weeks in Treatment: 44 ?Visit Information History Since Last Visit ?Added or deleted any medications: No ?Patient Arrived: Wheel Chair ?Had a fall or experienced change in No ?Arrival Time: 13:21 ?activities of daily living that may affect ?Accompanied By: daughter ?risk of falls: ?Transfer Assistance: [employer] Patient Lift ?Hospitalized since last visit: No ?Patient Identification Verified: Yes ?Has Dressing in Place as Prescribed: Yes ?Secondary Verification Process Completed: Yes ?Pain Present Now: No ?Patient Requires Transmission-Based No ?Precautions: ?Patient Has Alerts: Yes ?Patient Alerts: NOT diabetic ?ABI R1.18 L1.25 ?06/27/20 ?Electronic Signature(s) ?Signed: 05/02/2021 3:44:29 PM By: Donnamarie Poag ?Entered ByDonnamarie Poag on 05/02/2021 13:27:17 ?Stump, Jinnifer L. ([id number]) ?-------------------------------------------------------------------------------- ?Encounter Discharge Information Details ?Patient Name: [name] [name], [name] [name] ?Date of Service: 05/02/2021 1:00 PM ?Medical Record Number: [id number] ?Patient Account Number: [id number] ?Date of Birth/Sex: Jun 22, 1926 (88 y.o. F) ?Treating RN: Donnamarie Poag ?Primary Care Mohmed Farver: [name] Other Clinician: ?Referring Dugan Vanhoesen: [name] ?Treating Edem Tiegs/Extender: Jeri Cos ?Weeks in Treatment: 44 ?Encounter Discharge Information Items Post Procedure Vitals ?Discharge Condition: Stable ?Temperature (?F): 97.6 ?Ambulatory Status: Wheelchair ?Pulse (bpm): 81 ?Discharge Destination: Home ?Respiratory Rate (breaths/min):  16 ?Transportation: Private Auto ?Blood Pressure (mmHg): 113/60 ?Accompanied By: daughter ?Schedule Follow-up Appointment: Yes ?Clinical Summary of Care: ?Electronic Signature(s) ?Signed: 05/02/2021 3:44:29 PM By: Donnamarie Poag ?Entered ByDonnamarie Poag on 05/02/2021 13:56:40 ?Guerette, Kaithlyn L. ([id number]) ?-------------------------------------------------------------------------------- ?Lower Extremity Assessment Details ?Patient Name: [name] [name], [name] [name] ?Date of Service: 05/02/2021 1:00 PM ?Medical Record Number: [id number] ?Patient Account Number: [id number] ?Date of Birth/Sex: Oct 31, 1926 (88 y.o. F) ?Treating RN: Donnamarie Poag ?Primary Care Caeli Linehan: [name] Other Clinician: ?Referring Enza Shone: [name] ?Treating Reeve Mallo/Extender: Jeri Cos ?Weeks in Treatment: 44 ?Edema Assessment ?Assessed: [Left: Yes] [Right: No] ?Edema: [Left: N] [Right: o] ?Ankle ?Left: Right: ?Point of Measurement: From Medial Instep 21 cm ?Vascular Assessment ?Pulses: ?Dorsalis Pedis ?Palpable: [Left:Yes] ?Electronic Signature(s) ?Signed: 05/02/2021 3:44:29 PM By: Donnamarie Poag ?Entered ByDonnamarie Poag on 05/02/2021 13:35:26 ?Macmurray, Dekisha L. ([id number]) ?-------------------------------------------------------------------------------- ?Multi Wound Chart Details ?Patient Name: [name] [name], [name] [name] ?Date of Service: 05/02/2021 1:00 PM ?Medical Record Number: [id number] ?Patient Account Number: [id number] ?Date of Birth/Sex: 1926-08-05 (88 y.o. F) ?Treating RN: Donnamarie Poag ?Primary Care Gianny Sabino: [name] Other Clinician: ?Referring Angeliyah Kirkey: [name] ?Treating Mikaylah Libbey/Extender: Jeri Cos ?Weeks in Treatment: 44 ?Vital Signs ?Height(in): 58 ?Pulse(bpm): 81 ?Weight(lbs): 137 ?Blood Pressure(mmHg): 113/60 ?Body Mass Index(BMI): 28.6 ?Temperature(??F): 97.6 ?Respiratory Rate(breaths/min): 16 ?Photos: [N/A:N/A] ?Wound Location: Left Foot N/A N/A ?Wounding Event: Pressure Injury N/A N/A ?Primary Etiology: Pressure Ulcer N/A  N/A ?Comorbid History: Cataracts, Anemia, Coronary Artery N/A N/A ?Disease, Hypertension, Myocardial ?Infarction, End Stage Renal ?Disease, Gout, Osteoarthritis, ?Dementia ?Date Acquired: 10/02/2020 N/A N/A ?Weeks of Treatment: 30 N/A N/A ?Wound Status: Open N/A N/A ?Wound Recurrence: No N/A N/A ?Measurements L x W x D (cm) 0.2x0.4x0.3 N/A N/A ?Area (cm?) : 0.063 N/A N/A ?Volume (cm?) : 0.019 N/A N/A ?% Reduction in Area: 79.90% N/A N/A ?% Reduction in Volume: 38.70% N/A N/A ?Classification: Category/Stage III N/A N/A ?Exudate Amount: Large N/A N/A ?Exudate Type: Serous N/A N/A ?Exudate Color: amber N/A N/A ?Wound Margin: Thickened N/A N/A ?Granulation Amount: Large (67-100%) N/A N/A ?Granulation Quality: Pale N/A N/A ?Necrotic Amount: Small (  1-33%) N/A N/A ?Exposed Structures: ?Fat Layer (Subcutaneous Tissue): N/A N/A ?Yes ?Fascia: No ?Tendon: No ?Muscle: No ?Joint: No ?Bone: No ?Epithelialization: Small (1-33%) N/A N/A ?Treatment Notes ?Electronic Signature(s) ?Signed: 05/02/2021 3:44:29 PM By: Donnamarie Poag ?Entered ByDonnamarie Poag on 05/02/2021 13:42:03 ?Lindenberger, Lyrah L. ([id number]) ?Sanfilippo, Lizet L. ([id number]) ?-------------------------------------------------------------------------------- ?Multi-Disciplinary Care Plan Details ?Patient Name: [name] [name], [name] [name] ?Date of Service: 05/02/2021 1:00 PM ?Medical Record Number: [id number] ?Patient Account Number: [id number] ?Date of Birth/Sex: [birth year] (88 y.o. F) ?Treating RN: Donnamarie Poag ?Primary Care Danyela Posas: [name] Other Clinician: ?Referring Donetta Isaza: [name] ?Treating Codey Burling/Extender: Jeri Cos ?Weeks in Treatment: 44 ?Active Inactive ?Electronic Signature(s) ?Signed: 05/02/2021 3:44:29 PM By: Donnamarie Poag ?Entered ByDonnamarie Poag on 05/02/2021 13:35:41 ?Amsden, Monty L. ([id number]) ?-------------------------------------------------------------------------------- ?Non-Wound Condition Assessment Details ?Patient Name: [name] [name], [name] [name] ?Date  of Service: 05/02/2021 1:00 PM ?Medical Record Number: [id number] ?Patient Account Number: [id number] ?Date of Birth/Sex: 1926/09/21 (88 y.o. F) ?Treating RN: Donnamarie Poag ?Primary Care Juriel Cid: [name] Other Clinician: ?Referring Marylynne Keelin: [name] ?Treating Christin Mccreedy/Extender: Jeri Cos ?Weeks in Treatment: 44 ?Non-Wound Condition: ?Condition: Suspected Deep Tissue Injury ?Location: Foot ?Side: Left ?Notes: distal great L toe ?Photos ?Electronic Signature(s) ?Signed: 05/02/2021 3:44:29 PM By: Donnamarie Poag ?Entered ByDonnamarie Poag on 05/02/2021 13:34:41 ?Geralds, Jett L. ([id number]) ?-------------------------------------------------------------------------------- ?Non-Wound Condition Assessment Details ?Patient Name: [name] [name], [name] [name] ?Date of Service: 05/02/2021 1:00 PM ?Medical Record Number: [id number] ?Patient Account Number: [id number] ?Date of Birth/Sex: 08-08-1926 (88 y.o. F) ?Treating RN: Donnamarie Poag ?Primary Care Merit Gadsby: [name] Other Clinician: ?Referring Uriyah Raska: [name] ?Treating Rodrigus Kilker/Extender: Jeri Cos ?Weeks in Treatment: 44 ?Non-Wound Condition: ?Condition: Suspected Deep Tissue Injury ?Location: Foot ?Side: Left ?Notes: Left medial foot ?Photos ?Electronic Signature(s) ?Signed: 05/02/2021 3:44:29 PM By: Donnamarie Poag ?Entered ByDonnamarie Poag on 05/02/2021 13:48:31 ?Weingart, Madie L. ([id number]) ?-------------------------------------------------------------------------------- ?Pain Assessment Details ?Patient Name: [name] [name], [name] [name] ?Date of Service: 05/02/2021 1:00 PM ?Medical Record Number: [id number] ?Patient Account Number: [id number] ?Date of Birth/Sex: 1926-12-15 (88 y.o. F) ?Treating RN: Donnamarie Poag ?Primary Care Punam Broussard: [name] Other Clinician: ?Referring Lakisa Lotz: [name] ?Treating Riddhi Grether/Extender: Jeri Cos ?Weeks in Treatment: 44 ?Active Problems ?Location of Pain Severity and Description of Pain ?Patient Has Paino No ?Site Locations ?Rate the  pain. ?Current Pain Level: 0 ?Pain Management and Medication ?Current Pain Management: ?Electronic Signature(s) ?Signed: 05/02/2021 3:44:29 PM By: Donnamarie Poag ?Entered ByDonnamarie Poag on 05/02/2021 13:32:40 ?Eng, INGEB

## 2021-05-09 ENCOUNTER — Other Ambulatory Visit: Payer: Self-pay

## 2021-05-09 ENCOUNTER — Encounter: Payer: PPO | Admitting: Physician Assistant

## 2021-05-09 DIAGNOSIS — L89893 Pressure ulcer of other site, stage 3: Secondary | ICD-10-CM | POA: Diagnosis not present

## 2021-05-09 DIAGNOSIS — I10 Essential (primary) hypertension: Secondary | ICD-10-CM | POA: Diagnosis not present

## 2021-05-09 DIAGNOSIS — F0150 Vascular dementia without behavioral disturbance: Secondary | ICD-10-CM | POA: Diagnosis not present

## 2021-05-09 NOTE — Progress Notes (Addendum)
Jeffrey, Evangela L. ([id number]) ?Visit Report for 05/09/2021 ?Chief Complaint Document Details ?Patient Name: [name] [name], [name] [name] ?Date of Service: 05/09/2021 3:15 PM ?Medical Record Number: [id number] ?Patient Account Number: [id number] ?Date of Birth/Sex: 1926/10/06 (88 y.o. F) ?Treating RN: Donnamarie Poag ?Primary Care Provider: [name] Other Clinician: ?Referring Provider: [name] ?Treating Provider/Extender: Jeri Cos ?Weeks in Treatment: 31 ?Information Obtained from: Patient ?Chief Complaint ?Left Heel Pressure Ulcer ?Electronic Signature(s) ?Signed: 05/09/2021 3:42:42 PM By: Worthy Keeler PA-C ?Entered By: Worthy Keeler on 05/09/2021 15:42:42 ?[name] [name], [name] ([id number]) ?-------------------------------------------------------------------------------- ?Debridement Details ?Patient Name: [name] [name], [name] [name] ?Date of Service: 05/09/2021 3:15 PM ?Medical Record Number: [id number] ?Patient Account Number: [id number] ?Date of Birth/Sex: 1927/01/06 (88 y.o. F) ?Treating RN: Donnamarie Poag ?Primary Care Provider: [name] Other Clinician: ?Referring Provider: [name] ?Treating Provider/Extender: Jeri Cos ?Weeks in Treatment: 105 ?Debridement Performed for ?Wound #3 Left Foot ?Assessment: ?Performed By: Physician Tommie Sams., PA-C ?Debridement Type: Debridement ?Level of Consciousness (Pre- ?Awake and Alert ?procedure): ?Pre-procedure Verification/Time Out ?Yes - 13:54 ?Taken: ?Start Time: 13:55 ?Pain Control: Lidocaine ?Total Area Debrided (L x W): 0.6 (cm) x 0.6 (cm) = 0.36 (cm?) ?Tissue and other material ?Viable, Non-Viable, Slough, Subcutaneous, Skin: Dermis , Slough ?debrided: ?Level: Skin/Subcutaneous Tissue ?Debridement Description: Excisional ?Instrument: Curette ?Bleeding: Minimum ?Hemostasis Achieved: Pressure ?Response to Treatment: Procedure was tolerated well ?Level of Consciousness (Post- ?Awake and Alert ?procedure): ?Post Debridement Measurements of Total Wound ?Length: (cm)  0.6 ?Stage: Category/Stage III ?Width: (cm) 0.6 ?Depth: (cm) 0.3 ?Volume: (cm?) 0.085 ?Character of Wound/Ulcer Post Debridement: Improved ?Post Procedure Diagnosis ?Same as Pre-procedure ?Electronic Signature(s) ?Signed: 05/09/2021 4:16:55 PM By: Donnamarie Poag ?Signed: 05/10/2021 5:58:01 PM By: Worthy Keeler PA-C ?Entered ByDonnamarie Poag on 05/09/2021 15:58:59 ?[name] [name], [name] ([id number]) ?-------------------------------------------------------------------------------- ?HPI Details ?Patient Name: [name] [name], [name] [name] ?Date of Service: 05/09/2021 3:15 PM ?Medical Record Number: [id number] ?Patient Account Number: [id number] ?Date of Birth/Sex: Dec 12, 1926 (88 y.o. F) ?Treating RN: Donnamarie Poag ?Primary Care Provider: [name] Other Clinician: ?Referring Provider: [name] ?Treating Provider/Extender: Jeri Cos ?Weeks in Treatment: 61 ?History of Present Illness ?HPI Description: 88 year old patient was recently seen by the PCPs office for significant pain right great toe which has been going on since July. ?She was initially treated with Keflex which she did not complete and after the office visit this time she has been put on doxycycline. at the time of ?her visit she was found to have a ulcer on the plantar surface of the right great toe and also had a pyogenic granuloma over this area. ?X-ray of the right foot done 12/29/2014 -- IMPRESSION:Soft-tissue swelling and ulceration right great toe. No underlying ?bony lytic lesion identified. If osteomyelitis remains of clinical concern MRI can be obtained. ?Past medical history significant for anemia, chronic kidney disease stage III, obesity, varicose veins, coronary artery disease, gout, history of ?nicotine addiction given up smoking in 2002, hypertension, status post cardiac catheterization, status post abdominal hysterectomy, ?cholecystectomy and tonsillectomy. ?hemoglobin A1c done in August was 5.8 ?01/22/2015 -- at this stage the [facility] Walking boat was going  to cost them significant amount of money and they want to defer using that at the ?present time. ?01/29/2015 -- she had a podiatry appointment and they have trimmed her toenails. She has not heard back from the vascular office regarding her ?venous duplex study and I have asked them to call personally so that they can get the appointment soon. ?02/06/2015 -- they have made contact with the vascular office and from  what I understand that test has been done but the report is pending. ?02/19/2014 -- the vascular test is scheduled for tomorrow ?Readmission: ?06/26/2020 upon evaluation today patient appears for initial evaluation in her clinic that she has been here before in 2016 and has been quite ?sometime. She did have a fractured hip in February on the 23rd 2022. Subsequently this had to be pinned and she ended up with a pressure ?injury on her heel following when she was using her foot to help move her around in the bed. Subsequently this has led to the wound that she has ?been dealing with since that time. She lives at home with her daughter currently she does have dementia. 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 ?07/03/2020 upon evaluation today patient appears to be doing well with regard to her heel ulcer. She did have arterial studies they appear to be ?doing excellent it was premature normal across the board with TBI's in the 90s and ABIs well within normal range. Nonetheless there does not ?appear to be any signs of arterial insufficiency whatsoever. With that being said the patient does have also signs of improvement there is some ?necrotic tissue in the base of the wound number to try to clear some of that away today I do believe the Iodoflex/Iodosorb is doing well. ?5/24; difficult punched out wound on the left medial heel. She has been using Iodoflex ?07/17/2020 upon  evaluation today patient appears to be doing well at this point in regard to her wound. I do feel like this is a little bit deeper but ?again that is what is expected as we continue with the Iodoflex I think that this is just going to get deeper until we get to the base of the wound. ?With that being said I think we are getting much closer to the base of the wound where we can have healthier tissue that we get a be managing ?here which that will be awesome. In the meantime I am not surprised by what I am seeing and in fact the wound appears to be better as ?compared to previous findings. ?07/24/2020 upon evaluation today patient appears to be doing well with regard to her wound. Overall I am extremely pleased with where things ?stand today. I do not see any signs of active infection which is great and overall I think that the patient is making good progress. There is good to ?be some need for sharp debridement today. ?07/31/2020 upon evaluation today patient appears to be doing better in regard to her heel ulcer. She has been tolerating the dressing changes ?without complication. Fortunately there does not appear to be any signs of active infection which is great and overall very pleased in this regard. ?No fevers, chills, nausea, vomiting, or diarrhea. ?08/14/2020 upon evaluation today patient appears to be doing better in regard to her heel ulcer. I am very pleased in that regard. Unfortunately ?she still has a slight deep tissue injury in regard to the medial portion of her foot over the same area. Unfortunately I think this is something that ?if were not careful it was can open up into the wound. That is my main concern here based on what I see. Fortunately there does not appear to be ?any evidence of active infection which is great news and overall very pleased with where things stand at this point. ?08/21/2020 upon evaluation  today patient appears to be doing well with regard to her wound. She has been tolerating  the dressing changes without ?complication. Fortunately there does not appear to be any signs of active infection at this time. No fevers, chills, nausea, vomiting, or diarrhea. I ?do believe the compressio

## 2021-05-09 NOTE — Progress Notes (Signed)
Joshua, Vetra L. ([id number]) ?Visit Report for 05/09/2021 ?Arrival Information Details ?Patient Name: [name] [name], [name] [name] ?Date of Service: 05/09/2021 3:15 PM ?Medical Record Number: [id number] ?Patient Account Number: [id number] ?Date of Birth/Sex: 1927-01-29 (88 y.o. F) ?Treating RN: Donnamarie Poag ?Primary Care Shakela Donati: [name] Other Clinician: ?Referring Karmen Altamirano: [name] ?Treating Marvel Mcphillips/Extender: Jeri Cos ?Weeks in Treatment: 47 ?Visit Information History Since Last Visit ?Added or deleted any medications: No ?Patient Arrived: Ambulatory ?Had a fall or experienced change in No ?Arrival Time: 15:38 ?activities of daily living that may affect ?Accompanied By: daughter ?risk of falls: ?Transfer Assistance: Manual ?Hospitalized since last visit: No ?Patient Identification Verified: Yes ?Has Dressing in Place as Prescribed: Yes ?Secondary Verification Process Completed: Yes ?Pain Present Now: No ?Patient Requires Transmission-Based No ?Precautions: ?Patient Has Alerts: Yes ?Patient Alerts: NOT diabetic ?ABI R1.18 L1.25 ?06/27/20 ?Electronic Signature(s) ?Signed: 05/09/2021 4:16:55 PM By: Donnamarie Poag ?Entered ByDonnamarie Poag on 05/09/2021 15:38:50 ?Christianson, Demia L. ([id number]) ?-------------------------------------------------------------------------------- ?Encounter Discharge Information Details ?Patient Name: [name] [name], [name] [name] ?Date of Service: 05/09/2021 3:15 PM ?Medical Record Number: [id number] ?Patient Account Number: [id number] ?Date of Birth/Sex: 04-Jun-1926 (88 y.o. F) ?Treating RN: Donnamarie Poag ?Primary Care Tasharra Nodine: [name] Other Clinician: ?Referring Jonessa Triplett: [name] ?Treating Deerica Waszak/Extender: Jeri Cos ?Weeks in Treatment: 76 ?Encounter Discharge Information Items Post Procedure Vitals ?Discharge Condition: Stable ?Temperature (?F): 98.1 ?Ambulatory Status: Wheelchair ?Pulse (bpm): 86 ?Discharge Destination: Home ?Respiratory Rate (breaths/min): 16 ?Transportation: Private  Auto ?Blood Pressure (mmHg): 78/48 ?Accompanied By: daughter ?Schedule Follow-up Appointment: Yes ?Clinical Summary of Care: ?Electronic Signature(s) ?Signed: 05/09/2021 4:16:55 PM By: Donnamarie Poag ?Entered ByDonnamarie Poag on 05/09/2021 16:07:54 ?Bara, Tujuana L. ([id number]) ?-------------------------------------------------------------------------------- ?Lower Extremity Assessment Details ?Patient Name: [name] [name], [name] [name] ?Date of Service: 05/09/2021 3:15 PM ?Medical Record Number: [id number] ?Patient Account Number: [id number] ?Date of Birth/Sex: [birth year] (88 y.o. F) ?Treating RN: Donnamarie Poag ?Primary Care Amarilis Belflower: [name] Other Clinician: ?Referring Eriel Doyon: [name] ?Treating Tamber Burtch/Extender: Jeri Cos ?Weeks in Treatment: 48 ?Edema Assessment ?Assessed: [Left: Yes] [Right: No] ?[Left: Edema] [Right: :] ?Ankle ?Left: Right: ?Point of Measurement: From Medial Instep 21 cm ?Electronic Signature(s) ?Signed: 05/09/2021 4:16:55 PM By: Donnamarie Poag ?Entered ByDonnamarie Poag on 05/09/2021 15:46:22 ?Rankin, Aivy L. ([id number]) ?-------------------------------------------------------------------------------- ?Multi Wound Chart Details ?Patient Name: [name] [name], [name] [name] ?Date of Service: 05/09/2021 3:15 PM ?Medical Record Number: [id number] ?Patient Account Number: [id number] ?Date of Birth/Sex: Apr 19, 1926 (88 y.o. F) ?Treating RN: Donnamarie Poag ?Primary Care Micah Galeno: [name] Other Clinician: ?Referring Caitlinn Klinker: [name] ?Treating Elier Zellars/Extender: Jeri Cos ?Weeks in Treatment: 29 ?Vital Signs ?Height(in): 58 ?Pulse(bpm): 86 ?Weight(lbs): 137 ?Blood Pressure(mmHg): 78/48 ?Body Mass Index(BMI): 28.6 ?Temperature(??F): 98.1 ?Respiratory Rate(breaths/min): 16 ?Photos: [N/A:N/A] ?Wound Location: Left Foot N/A N/A ?Wounding Event: Pressure Injury N/A N/A ?Primary Etiology: Pressure Ulcer N/A N/A ?Comorbid History: Cataracts, Anemia, Coronary Artery N/A N/A ?Disease, Hypertension,  Myocardial ?Infarction, End Stage Renal ?Disease, Gout, Osteoarthritis, ?Dementia ?Date Acquired: 10/02/2020 N/A N/A ?Weeks of Treatment: 31 N/A N/A ?Wound Status: Open N/A N/A ?Wound Recurrence: No N/A N/A ?Measurements L x W x D (cm) 0.2x0.4x0.3 N/A N/A ?Area (cm?) : 0.063 N/A N/A ?Volume (cm?) : 0.019 N/A N/A ?% Reduction in Area: 79.90% N/A N/A ?% Reduction in Volume: 38.70% N/A N/A ?Starting Position 1 (o'clock): 3 ?Ending Position 1 (o'clock): 10 ?Maximum Distance 1 (cm): 0.6 ?Undermining: Yes N/A N/A ?Classification: Category/Stage III N/A N/A ?Exudate Amount: Medium N/A N/A ?Exudate Type: Serosanguineous N/A N/A ?Exudate Color: red, brown N/A N/A ?Wound Margin: Thickened N/A N/A ?Granulation Amount: Large (67-100%) N/A  N/A ?Granulation Quality: Red N/A N/A ?Necrotic Amount: Small (1-33%) N/A N/A ?Exposed Structures: ?Fat Layer (Subcutaneous Tissue): N/A N/A ?Yes ?Fascia: No ?Tendon: No ?Muscle: No ?Joint: No ?Bone: No ?Epithelialization: Small (1-33%) N/A N/A ?Treatment Notes ?Kole, Summers L. ([id number]) ?Electronic Signature(s) ?Signed: 05/09/2021 4:16:55 PM By: Donnamarie Poag ?Entered ByDonnamarie Poag on 05/09/2021 15:55:37 ?Lang, Jadalynn L. ([id number]) ?-------------------------------------------------------------------------------- ?Multi-Disciplinary Care Plan Details ?Patient Name: [name] [name], [name] [name] ?Date of Service: 05/09/2021 3:15 PM ?Medical Record Number: [id number] ?Patient Account Number: [id number] ?Date of Birth/Sex: 12/22/1926 (88 y.o. F) ?Treating RN: Donnamarie Poag ?Primary Care Divinity Kyler: [name] Other Clinician: ?Referring Teagan Ozawa: [name] ?Treating Cashel Bellina/Extender: Jeri Cos ?Weeks in Treatment: 49 ?Active Inactive ?Electronic Signature(s) ?Signed: 05/09/2021 4:16:55 PM By: Donnamarie Poag ?Entered ByDonnamarie Poag on 05/09/2021 15:50:23 ?Mcmartin, Daphane L. ([id number]) ?-------------------------------------------------------------------------------- ?Non-Wound Condition Assessment  Details ?Patient Name: [name] [name], [name] [name] ?Date of Service: 05/09/2021 3:15 PM ?Medical Record Number: [id number] ?Patient Account Number: [id number] ?Date of Birth/Sex: [birth year] (88 y.o. F) ?Treating RN: Donnamarie Poag ?Primary Care Delila Kuklinski: [name] Other Clinician: ?Referring Haruye Lainez: [name] ?Treating Idy Rawling/Extender: Jeri Cos ?Weeks in Treatment: 45 ?Non-Wound Condition: ?Condition: Suspected Deep Tissue Injury ?Location: Foot ?Side: Left ?Notes: Left medial foot ?Photos ?Electronic Signature(s) ?Signed: 05/09/2021 4:16:55 PM By: Donnamarie Poag ?Entered ByDonnamarie Poag on 05/09/2021 15:45:36 ?Renninger, Alexiya L. ([id number]) ?-------------------------------------------------------------------------------- ?Non-Wound Condition Assessment Details ?Patient Name: [name] [name], [name] [name] ?Date of Service: 05/09/2021 3:15 PM ?Medical Record Number: [id number] ?Patient Account Number: [id number] ?Date of Birth/Sex: October 07, 1926 (88 y.o. F) ?Treating RN: Donnamarie Poag ?Primary Care Arion Morgan: [name] Other Clinician: ?Referring Phelan Schadt: [name] ?Treating Chondra Boyde/Extender: Jeri Cos ?Weeks in Treatment: 45 ?Non-Wound Condition: ?Condition: Suspected Deep Tissue Injury ?Location: Foot ?Side: Left ?Notes: distal great L toe ?Photos ?Electronic Signature(s) ?Signed: 05/09/2021 4:16:55 PM By: Donnamarie Poag ?Entered ByDonnamarie Poag on 05/09/2021 15:45:56 ?Lantry, Sareen L. ([id number]) ?-------------------------------------------------------------------------------- ?Pain Assessment Details ?Patient Name: [name] [name], [name] [name] ?Date of Service: 05/09/2021 3:15 PM ?Medical Record Number: [id number] ?Patient Account Number: [id number] ?Date of Birth/Sex: 1927-01-28 (88 y.o. F) ?Treating RN: Donnamarie Poag ?Primary Care Yailin Biederman: [name] Other Clinician: ?Referring Emanuelle Bastos: [name] ?Treating Juley Giovanetti/Extender: Jeri Cos ?Weeks in Treatment: 57 ?Active Problems ?Location of Pain Severity and Description of  Pain ?Patient Has Paino No ?Site Locations ?Rate the pain. ?Current Pain Level: 0 ?Pain Management and Medication ?Current Pain Management: ?Electronic Signature(s) ?Signed: 05/09/2021 4:16:55 PM By: Donnamarie Poag ?Entered By: B

## 2021-05-10 ENCOUNTER — Telehealth: Payer: Self-pay | Admitting: Student

## 2021-05-10 NOTE — Telephone Encounter (Signed)
RTC to patient's daughter Noreene Larsson and we discussed the Palliative referral/services and all questions were answered and she was in agreement with scheduling visit.  I have scheduled a MyChart Palliative Consult for 05/21/21 @ 2 PM ?

## 2021-05-10 NOTE — Telephone Encounter (Signed)
Attempted to contact patient's daughter Nada Libman to offer to schedule a Palliative Consult, no answer - left message with reason for call requesting a return call to schedule visit. ?

## 2021-05-16 ENCOUNTER — Encounter: Payer: PPO | Admitting: Physician Assistant

## 2021-05-21 ENCOUNTER — Telehealth: Payer: PPO | Admitting: Student

## 2021-05-21 DIAGNOSIS — L89899 Pressure ulcer of other site, unspecified stage: Secondary | ICD-10-CM | POA: Diagnosis not present

## 2021-05-21 DIAGNOSIS — Z515 Encounter for palliative care: Secondary | ICD-10-CM

## 2021-05-21 DIAGNOSIS — I10 Essential (primary) hypertension: Secondary | ICD-10-CM | POA: Diagnosis not present

## 2021-05-21 DIAGNOSIS — R531 Weakness: Secondary | ICD-10-CM | POA: Diagnosis not present

## 2021-05-21 DIAGNOSIS — H1132 Conjunctival hemorrhage, left eye: Secondary | ICD-10-CM

## 2021-05-23 ENCOUNTER — Encounter: Payer: PPO | Attending: Physician Assistant | Admitting: Physician Assistant

## 2021-05-23 DIAGNOSIS — D649 Anemia, unspecified: Secondary | ICD-10-CM | POA: Diagnosis not present

## 2021-05-23 DIAGNOSIS — Z9049 Acquired absence of other specified parts of digestive tract: Secondary | ICD-10-CM | POA: Diagnosis not present

## 2021-05-23 DIAGNOSIS — N183 Chronic kidney disease, stage 3 unspecified: Secondary | ICD-10-CM | POA: Diagnosis not present

## 2021-05-23 DIAGNOSIS — Z6828 Body mass index (BMI) 28.0-28.9, adult: Secondary | ICD-10-CM | POA: Diagnosis not present

## 2021-05-23 DIAGNOSIS — I120 Hypertensive chronic kidney disease with stage 5 chronic kidney disease or end stage renal disease: Secondary | ICD-10-CM | POA: Diagnosis not present

## 2021-05-23 DIAGNOSIS — L980 Pyogenic granuloma: Secondary | ICD-10-CM | POA: Diagnosis not present

## 2021-05-23 DIAGNOSIS — I10 Essential (primary) hypertension: Secondary | ICD-10-CM | POA: Diagnosis not present

## 2021-05-23 DIAGNOSIS — L89893 Pressure ulcer of other site, stage 3: Secondary | ICD-10-CM | POA: Insufficient documentation

## 2021-05-23 DIAGNOSIS — I2510 Atherosclerotic heart disease of native coronary artery without angina pectoris: Secondary | ICD-10-CM | POA: Diagnosis not present

## 2021-05-23 DIAGNOSIS — Z87891 Personal history of nicotine dependence: Secondary | ICD-10-CM | POA: Diagnosis not present

## 2021-05-23 DIAGNOSIS — L97519 Non-pressure chronic ulcer of other part of right foot with unspecified severity: Secondary | ICD-10-CM | POA: Insufficient documentation

## 2021-05-23 DIAGNOSIS — F0150 Vascular dementia without behavioral disturbance: Secondary | ICD-10-CM | POA: Insufficient documentation

## 2021-05-23 DIAGNOSIS — M109 Gout, unspecified: Secondary | ICD-10-CM | POA: Insufficient documentation

## 2021-05-23 DIAGNOSIS — E669 Obesity, unspecified: Secondary | ICD-10-CM | POA: Insufficient documentation

## 2021-05-23 NOTE — Progress Notes (Signed)
[name], [name] ([id number]) ?Visit Report for 05/23/2021 ?Chief Complaint Document Details ?Patient Name: [name] [name], [name] [name] ?Date of Service: 05/23/2021 1:00 PM ?Medical Record Number: [id number] ?Patient Account Number: [id number] ?Date of Birth/Sex: [birth year] (88 y.o. F) ?Treating RN: Donnamarie Poag ?Primary Care Provider: [name] Other Clinician: ?Referring Provider: [name] ?Treating Provider/Extender: Jeri Cos ?Weeks in Treatment: 47 ?Information Obtained from: Patient ?Chief Complaint ?Left Heel Pressure Ulcer ?Electronic Signature(s) ?Signed: 05/23/2021 1:20:47 PM By: Worthy Keeler PA-C ?Entered By: Worthy Keeler on 05/23/2021 13:20:47 ?[name], [name] ([id number]) ?-------------------------------------------------------------------------------- ?HPI Details ?Patient Name: [name] [name], [name] [name] ?Date of Service: 05/23/2021 1:00 PM ?Medical Record Number: [id number] ?Patient Account Number: [id number] ?Date of Birth/Sex: 30-Aug-1926 (88 y.o. F) ?Treating RN: Donnamarie Poag ?Primary Care Provider: [name] Other Clinician: ?Referring Provider: [name] ?Treating Provider/Extender: Jeri Cos ?Weeks in Treatment: 47 ?History of Present Illness ?HPI Description: 88 year old patient was recently seen by the PCPs office for significant pain right great toe which has been going on since July. ?She was initially treated with Keflex which she did not complete and after the office visit this time she has been put on doxycycline. at the time of ?her visit she was found to have a ulcer on the plantar surface of the right great toe and also had a pyogenic granuloma over this area. ?X-ray of the right foot done 12/29/2014 -- IMPRESSION:Soft-tissue swelling and ulceration right great toe. No underlying ?bony lytic lesion identified. If osteomyelitis remains of clinical concern MRI can be obtained. ?Past medical history significant for anemia, chronic kidney disease stage III, obesity, varicose veins,  coronary artery disease, gout, history of ?nicotine addiction given up smoking in 2002, hypertension, status post cardiac catheterization, status post abdominal hysterectomy, ?cholecystectomy and tonsillectomy. ?hemoglobin A1c done in August was 5.8 ?01/22/2015 -- at this stage the [facility] Walking boat was going to cost them significant amount of money and they want to defer using that at the ?present time. ?01/29/2015 -- she had a podiatry appointment and they have trimmed her toenails. She has not heard back from the vascular office regarding her ?venous duplex study and I have asked them to call personally so that they can get the appointment soon. ?02/06/2015 -- they have made contact with the vascular office and from what I understand that test has been done but the report is pending. ?02/19/2014 -- the vascular test is scheduled for tomorrow ?Readmission: ?06/26/2020 upon evaluation today patient appears for initial evaluation in her clinic that she has been here before in 2016 and has been quite ?sometime. She did have a fractured hip in February on the 23rd 2022. Subsequently this had to be pinned and she ended up with a pressure ?injury on her heel following when she was using her foot to help move her around in the bed. Subsequently this has led to the wound that she has ?been dealing with since that time. She lives at home with her daughter currently she does have dementia. 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 ?07/03/2020 upon evaluation today patient appears to be doing well with regard to her heel ulcer. She did have arterial studies they appear to be ?doing excellent it was premature normal across the board with TBI's in the 90s and ABIs well within normal range. Nonetheless there does not ?appear to be any signs of arterial insufficiency whatsoever. With that being said  the patient does have also signs of improvement there is some ?necrotic tissue in the base of the wound number to try to clear some of that away today I do believe the Iodoflex/Iodosorb is doing well. ?5/24; difficult punched out wound on the left medial heel. She has been using Iodoflex ?07/17/2020 upon evaluation today patient appears to be doing well at this point in regard to her wound. I do feel like this is a little bit deeper but ?again that is what is expected as we continue with the Iodoflex I think that this is just going to get deeper until we get to the base of the wound. ?With that being said I think we are getting much closer to the base of the wound where we can have healthier tissue that we get a be managing ?here which that will be awesome. In the meantime I am not surprised by what I am seeing and in fact the wound appears to be better as ?compared to previous findings. ?07/24/2020 upon evaluation today patient appears to be doing well with regard to her wound. Overall I am extremely pleased with where things ?stand today. I do not see any signs of active infection which is great and overall I think that the patient is making good progress. There is good to ?be some need for sharp debridement today. ?07/31/2020 upon evaluation today patient appears to be doing better in regard to her heel ulcer. She has been tolerating the dressing changes ?without complication. Fortunately there does not appear to be any signs of active infection which is great and overall very pleased in this regard. ?No fevers, chills, nausea, vomiting, or diarrhea. ?08/14/2020 upon evaluation today patient appears to be doing better in regard to her heel ulcer. I am very pleased in that regard. Unfortunately ?she still has a slight deep tissue injury in regard to the medial portion of her foot over the same area. Unfortunately I think this is something that ?if were not careful it was can open up into the wound. That is my main  concern here based on what I see. Fortunately there does not appear to be ?any evidence of active infection which is great news and overall very pleased with where things stand at this point. ?08/21/2020 upon evaluation today patient appears to be doing well with regard to her wound. She has been tolerating the dressing changes without ?complication. Fortunately there does not appear to be any signs of active infection at this time. No fevers, chills, nausea, vomiting, or diarrhea. I ?do believe the compression wrap was beneficial for her. ?08/28/2020 upon evaluation today patient appears to be doing well with regard to her wounds currently. Fortunately there does not appear to be ?any signs of active infection at this time. No fevers, chills, nausea, vomiting, or diarrhea. With that being said I think that her leg is doing quite ?well to be honest. ?09/04/2020 upon evaluation today patient appears to be doing well with regard to her wound on the heel. This is showing signs of good epithelial ?growth although there is probably can it be an indention where this heals that is okay as long as we get it closed. Fortunately I do not see any ?evidence of infection at this point. ?09/13/2020 upon evaluation today patient appears to be doing well with regard to her wounds. She has been tolerating the dressing changes ?Rinks, Gerardo L. ([id number]) ?without complication. Fortunately he is actually doing extremely well and I think she is making  great progress this is measuring smaller. I would ?recommend that such that we continue with the [facility] likely since he is doing so well. ?09/18/2020 upon evaluation today patient appears to be doing well with regard to her heel ulcer. She is making good progress and I am very ?pleased with what we see today. I think the Hydrofera Blue is still doing excellent. ?10/02/2020 upon evaluation today patient's wound actually appears to be showing signs of good improvement in regard to the  heel. I am very ?pleased in this regard. With that being said I do believe that the area which is somewhat been stable and dry is starting to lift up and beginning to ?clear this away as well that is on the foo

## 2021-05-23 NOTE — Progress Notes (Signed)
[name] [name], [name] ([id number]) ?Visit Report for 05/23/2021 ?Arrival Information Details ?Patient Name: [name] [name], [name] [name] ?Date of Service: 05/23/2021 1:00 PM ?Medical Record Number: [id number] ?Patient Account Number: [id number] ?Date of Birth/Sex: 1926-03-16 (88 y.o. F) ?Treating RN: Donnamarie Poag ?Primary Care Janos Shampine: [name] Other Clinician: ?Referring Newman Waren: [name] ?Treating Xochitl Egle/Extender: Jeri Cos ?Weeks in Treatment: 47 ?Visit Information History Since Last Visit ?Added or deleted any medications: No ?Patient Arrived: Wheel Chair ?Had a fall or experienced change in No ?Arrival Time: 13:06 ?activities of daily living that may affect ?Accompanied By: daughter ?risk of falls: ?Transfer Assistance: None ?Hospitalized since last visit: No ?Patient Identification Verified: Yes ?Has Dressing in Place as Prescribed: Yes ?Secondary Verification Process Completed: Yes ?Pain Present Now: No ?Patient Requires Transmission-Based No ?Precautions: ?Patient Has Alerts: Yes ?Patient Alerts: NOT diabetic ?ABI R1.18 L1.25 ?06/27/20 ?Electronic Signature(s) ?Signed: 05/23/2021 4:38:12 PM By: Donnamarie Poag ?Entered ByDonnamarie Poag on 05/23/2021 13:10:25 ?[name] [name], [name] ([id number]) ?-------------------------------------------------------------------------------- ?Clinic Level of Care Assessment Details ?Patient Name: [name] [name], [name] [name] ?Date of Service: 05/23/2021 1:00 PM ?Medical Record Number: [id number] ?Patient Account Number: [id number] ?Date of Birth/Sex: [birth year] (88 y.o. F) ?Treating RN: Donnamarie Poag ?Primary Care Jatavian Calica: [name] Other Clinician: ?Referring Alonza Knisley: [name] ?Treating Ren Aspinall/Extender: Jeri Cos ?Weeks in Treatment: 47 ?Clinic Level of Care Assessment Items ?TOOL 4 Quantity Score ?[]  - Use when only an EandM is performed on FOLLOW-UP visit 0 ?ASSESSMENTS - Nursing Assessment / Reassessment ?[]  - Reassessment of Co-morbidities (includes updates in patient status) 0 ?[]  -  0 ?Reassessment of Adherence to Treatment Plan ?ASSESSMENTS - Wound and Skin Assessment / Reassessment ?X - Simple Wound Assessment / Reassessment - one wound 1 5 ?[]  - 0 ?Complex Wound Assessment / Reassessment - multiple wounds ?[]  - 0 ?Dermatologic / Skin Assessment (not related to wound area) ?ASSESSMENTS - Focused Assessment ?[]  - Circumferential Edema Measurements - multi extremities 0 ?[]  - 0 ?Nutritional Assessment / Counseling / Intervention ?[]  - 0 ?Lower Extremity Assessment (monofilament, tuning fork, pulses) ?[]  - 0 ?Peripheral Arterial Disease Assessment (using hand held doppler) ?ASSESSMENTS - Ostomy and/or Continence Assessment and Care ?[]  - Incontinence Assessment and Management 0 ?[]  - 0 ?Ostomy Care Assessment and Management (repouching, etc.) ?PROCESS - Coordination of Care ?X - Simple Patient / Family Education for ongoing care 1 15 ?[]  - 0 ?Complex (extensive) Patient / Family Education for ongoing care ?[]  - 0 ?Staff obtains Consents, Records, Test Results / Process Orders ?[]  - 0 ?Staff telephones HHA, Nursing Homes / Clarify orders / etc ?[]  - 0 ?Routine Transfer to another Facility (non-emergent condition) ?[]  - 0 ?Routine Hospital Admission (non-emergent condition) ?[]  - 0 ?New Admissions / Biomedical engineer / Ordering NPWT, Apligraf, etc. ?[]  - 0 ?Emergency Hospital Admission (emergent condition) ?X- 1 10 ?Simple Discharge Coordination ?[]  - 0 ?Complex (extensive) Discharge Coordination ?PROCESS - Special Needs ?[]  - Pediatric / Minor Patient Management 0 ?[]  - 0 ?Isolation Patient Management ?[]  - 0 ?Hearing / Language / Visual special needs ?[]  - 0 ?Assessment of Community assistance (transportation, D/C planning, etc.) ?[]  - 0 ?Additional assistance / Altered mentation ?[]  - 0 ?Support Surface(s) Assessment (bed, cushion, seat, etc.) ?INTERVENTIONS - Wound Cleansing / Measurement ?[name] [name], [name] ([id number]) ?X- 1 5 ?Simple Wound Cleansing - one wound ?[]  - 0 ?Complex Wound  Cleansing - multiple wounds ?X- 1 5 ?Wound Imaging (photographs - any number of wounds) ?[]  - 0 ?Wound Tracing (instead of photographs) ?X- 1 5 ?Simple Wound Measurement -  one wound ?[]  - 0 ?Complex Wound Measurement - multiple wounds ?INTERVENTIONS - Wound Dressings ?X - Small Wound Dressing one or multiple wounds 1 10 ?[]  - 0 ?Medium Wound Dressing one or multiple wounds ?[]  - 0 ?Large Wound Dressing one or multiple wounds ?X- 1 5 ?Application of Medications - topical ?[]  - 0 ?Application of Medications - injection ?INTERVENTIONS - Miscellaneous ?[]  - External ear exam 0 ?[]  - 0 ?Specimen Collection (cultures, biopsies, blood, body fluids, etc.) ?[]  - 0 ?Specimen(s) / Culture(s) sent or taken to Lab for analysis ?[]  - 0 ?Patient Transfer (multiple staff / Civil Service fast streamer / Similar devices) ?[]  - 0 ?Simple Staple / Suture removal (25 or less) ?[]  - 0 ?Complex Staple / Suture removal (26 or more) ?[]  - 0 ?Hypo / Hyperglycemic Management (close monitor of Blood Glucose) ?[]  - 0 ?Ankle / Brachial Index (ABI) - do not check if billed separately ?X- 1 5 ?Vital Signs ?Has the patient been seen at the hospital within the last three years: Yes ?Total Score: 65 ?Level Of Care: New/Established - Level ?2 ?Electronic Signature(s) ?Signed: 05/23/2021 4:38:12 PM By: Donnamarie Poag ?Entered ByDonnamarie Poag on 05/23/2021 13:36:35 ?[name] [name], [name] ([id number]) ?-------------------------------------------------------------------------------- ?Encounter Discharge Information Details ?Patient Name: [name] [name], [name] [name] ?Date of Service: 05/23/2021 1:00 PM ?Medical Record Number: [id number] ?Patient Account Number: [id number] ?Date of Birth/Sex: 05-01-1926 (88 y.o. F) ?Treating RN: Donnamarie Poag ?Primary Care Lakin Romer: [name] Other Clinician: ?Referring Govani Radloff: [name] ?Treating Breya Cass/Extender: Jeri Cos ?Weeks in Treatment: 47 ?Encounter Discharge Information Items ?Discharge Condition: Stable ?Ambulatory Status:  Wheelchair ?Discharge Destination: Home ?Transportation: Private Auto ?Accompanied By: daughter ?Schedule Follow-up Appointment: Yes ?Clinical Summary of Care: ?Electronic Signature(s) ?Signed: 05/23/2021 4:38:12 PM By: Donnamarie Poag ?Entered ByDonnamarie Poag on 05/23/2021 13:39:06 ?[name] [name], [name] ([id number]) ?-------------------------------------------------------------------------------- ?Lower Extremity Assessment Details ?Patient Name: [name] [name], [name] [name] ?Date of Service: 05/23/2021 1:00 PM ?Medical Record Number: [id number] ?Patient Account Number: [id number] ?Date of Birth/Sex: 1926/08/04 (88 y.o. F) ?Treating RN: Donnamarie Poag ?Primary Care Jamy Cleckler: [name] Other Clinician: ?Referring Hatcher Froning: [name] ?Treating Kyllie Pettijohn/Extender: Jeri Cos ?Weeks in Treatment: 47 ?Edema Assessment ?Assessed: [Left: Yes] [Right: No] ?Edema: [Left: N] [Right: o] ?Ankle ?Left: Right: ?Point of Measurement: From Medial Instep 22 cm ?Electronic Signature(s) ?Signed: 05/23/2021 4:38:12 PM By: Donnamarie Poag ?Entered ByDonnamarie Poag on 05/23/2021 13:18:43 ?[name] [name], [name] ([id number]) ?-------------------------------------------------------------------------------- ?Multi Wound Chart Details ?Patient Name: [name] [name], [name] [name] ?Date of Service: 05/23/2021 1:00 PM ?Medical Record Number: [id number] ?Patient Account Number: [id number] ?Date of Birth/Sex: 09-25-1926 (88 y.o. F) ?Treating RN: Donnamarie Poag ?Primary Care Sister Carbone: [name] Other Clinician: ?Referring Rosa Wyly: [name] ?Treating Cord Wilczynski/Extender: Jeri Cos ?Weeks in Treatment: 47 ?Vital Signs ?Height(in): 58 ?Pulse(bpm): 88 ?Weight(lbs): 137 ?Blood Pressure(mmHg): 131/81 ?Body Mass Index(BMI): 28.6 ?Temperature(??F): 98.9 ?Respiratory Rate(breaths/min): 16 ?Photos: [N/A:N/A] ?Wound Location: Left Foot N/A N/A ?Wounding Event: Pressure Injury N/A N/A ?Primary Etiology: Pressure Ulcer N/A N/A ?Comorbid History: Cataracts, Anemia, Coronary Artery N/A  N/A ?Disease, Hypertension, Myocardial ?Infarction, End Stage Renal ?Disease, Gout, Osteoarthritis, ?Dementia ?Date Acquired: 10/02/2020 N/A N/A ?Weeks of Treatment: 70 N/A N/A ?Wound Status: Open N/A N/A ?Wound Recurrence: No N

## 2021-05-29 ENCOUNTER — Other Ambulatory Visit: Payer: PPO | Admitting: Student

## 2021-05-29 DIAGNOSIS — Z515 Encounter for palliative care: Secondary | ICD-10-CM | POA: Diagnosis not present

## 2021-05-29 DIAGNOSIS — H1132 Conjunctival hemorrhage, left eye: Secondary | ICD-10-CM

## 2021-05-29 DIAGNOSIS — R630 Anorexia: Secondary | ICD-10-CM | POA: Diagnosis not present

## 2021-05-29 DIAGNOSIS — L89899 Pressure ulcer of other site, unspecified stage: Secondary | ICD-10-CM

## 2021-05-29 DIAGNOSIS — R531 Weakness: Secondary | ICD-10-CM | POA: Diagnosis not present

## 2021-05-29 DIAGNOSIS — K5900 Constipation, unspecified: Secondary | ICD-10-CM | POA: Diagnosis not present

## 2021-05-29 NOTE — Progress Notes (Signed)
? ? ?Manufacturing engineer ?Community Palliative Care Consult Note ?Telephone: [phone]  ?Fax: [phone]  ? ? ?Date of encounter: 05/29/21 ?1:35 PM ?PATIENT NAME: [name] [name] ?[address] ?[place] [address]   ?[phone] (home)  ?DOB: [birth year] ?MRN: [id number] ?PRIMARY CARE PROVIDER:    ?Birdie Sons, MD,  ?[address] ?[place] [address] ?[phone] ? ?REFERRING PROVIDER:   ?Birdie Sons, MD ?[address] ?[address] ?[place],  [place] [address] ?[phone] ? ?RESPONSIBLE PARTY:    ?Contact Information   ? ? Name Relation Home Work Mobile  ? [name],[name] Daughter [phone]  [phone]  ? ?  ? ? ? ?I met face to face with patient and family in the home. Palliative Care was asked to follow this patient by consultation request of  Fisher, Kirstie Peri, MD to address advance care planning and complex medical decision making. This is a follow up visit. ? ?                                 ASSESSMENT AND PLAN / RECOMMENDATIONS:  ? ?Advance Care Planning/Goals of Care: Goals include to maximize quality of life and symptom management. Patient/health care surrogate gave his/her permission to discuss. ?Our advance care planning conversation included a discussion about:    ?The value and importance of advance care planning  ?Experiences with loved ones who have been seriously ill or have died  ?Exploration of personal, cultural or spiritual beliefs that might influence medical decisions  ?Exploration of goals of care in the event of a sudden injury or illness  ?CODE STATUS: DNR ? ?Education on Palliative Medicine vs. Hospice services. Recommendations given on in home caregivers, day programs. We also discussed VA benefits for spouse to see if patient would qualify.  ? ?Symptom Management/Plan: ? ?Generalized weakness- family to continue assisting with daily care needs. Encourage use of w/c for locomotion. Declines therapy at this time. Monitor for falls/safety.  Recommend in home caregivers for additional support.  ? ?Constipation-start senna s one tab daily; encourage increasing fluid intake, adequate fiber in diet.  ? ?Subconjunctival hemorrhage-improving. Patient denies any concerns.  ? ?Appetite-fair appetite endorsed. Encourage foods patient enjoys. Monitor for weight loss.  ? ?Wounds to Left foot-continue wound care as directed by wound clinic. Follow up with wound clinic as scheduled.  ? ?Follow up Palliative Care Visit: Palliative care will continue to follow for complex medical decision making, advance care planning, and clarification of goals. Return in 4-6 weeks or prn. ? ? ?This visit was coded based on medical decision making (MDM). ? ?PPS: 50% ? ?HOSPICE ELIGIBILITY/DIAGNOSIS: TBD ? ?Chief Complaint: Palliative Medicine follow up visit.  ? ?HISTORY OF PRESENT ILLNESS:  [name] [name] is a 88 y.o. year old female  with CAD, hypertension, PVD, DVT of right lower extremity- no longer on Eliquis, GERD, hypothyroidism, CKD 4, gout.  ? ?Patient resides at home with family. Daughter states that patient's wound to left foot had worsened over a week. She continues to go to wound center weekly; dressing being changed twice a week. No falls since last week. W/c being used more for locomotion. Appetite is fair. Daughter is doing smaller portions but she eating meals, softer foods. Drinking around 24 ounces a day. Blood pressure fluctuates.Sleeping good at night; naps intermittently during the day. Patient was a Primary school teacher. Husband was stationed in [place], Scientist, research (life sciences). Husband passed in 2007; She  has lived with daughter since 2010. ? ?History obtained from review of EMR, discussion with primary team, and interview with family, facility staff/caregiver and/or [name]  ?I reviewed available labs, medications, imaging, studies and related documents from the EMR.  Records reviewed and summarized above.  ? ? ?Physical Exam: ? ?Constitutional: NAD ?General: frail  appearing, thin ?EYES: subconjunctival hemorrhage improving ?ENMT: intact hearing, oral mucous membranes moist ?CV: S1S2, RRR, no LE edema ?Pulmonary: LCTA, no increased work of breathing, no cough, room air ?Abdomen: normo-active BS + 4 quadrants, soft and non tender, no ascites ?GU: deferred ?MSK: moves all extremities, ambulatory short distances ?Skin: warm and dry, poor turgor, no rashes, dressing to left foot CDI ?Neuro: + generalized weakness,  A & O x 2 ?Psych: non-anxious affect, pleasant ?Hem/lymph/immuno: no widespread bruising ? ? ?Thank you for the opportunity to participate in the care of Ms. [name]  The palliative care team will continue to follow. Please call our office at [phone] if we can be of additional assistance.  ? ? S , NP  ? ?COVID-19 PATIENT SCREENING TOOL ?Asked and negative response unless otherwise noted:  ? ?Have you had symptoms of covid, tested positive or been in contact with someone with symptoms/positive test in the past 5-10 days? No ? ?

## 2021-05-29 NOTE — Progress Notes (Signed)
? ? ?Manufacturing engineer ?Community Palliative Care Consult Note ?Telephone: [phone]  ?Fax: [phone]  ? ?Date of encounter: 05/21/2021 ?1:09 PM ?PATIENT NAME: [name] [name] ?[address] ?[place] [address]   ?[phone] (home)  ?DOB: Mar 11, 1926 ?MRN: [id number] ?PRIMARY CARE PROVIDER:    ?Birdie Sons, MD,  ?[address] ?[place] [address] ?[phone] ? ?REFERRING PROVIDER:   ?Birdie Sons, MD ?[address] ?[address] ?[place],  [place] [address] ?[phone] ? ?RESPONSIBLE PARTY:    ?Contact Information   ? ? Name Relation Home Work Mobile  ? Reich,Jeanie Daughter [phone]  [phone]  ? ?  ? ? ? ?Due to the COVID-19 crisis, this visit was done via telemedicine from my office and it was initiated and consent by this patient and or family. ? ?I connected with  [name] [name] OR PROXY on 05/21/2021 by a video enabled telemedicine application and verified that I am speaking with the correct person using two identifiers. ?  ?I discussed the limitations of evaluation and management by telemedicine. The patient expressed understanding and agreed to proceed.  ? ? ?                                 ASSESSMENT AND PLAN / RECOMMENDATIONS:  ? ?Advance Care Planning/Goals of Care: Goals include to maximize quality of life and symptom management. Patient/health care surrogate gave his/her permission to discuss.Our advance care planning conversation included a discussion about:    ?The value and importance of advance care planning  ?Experiences with loved ones who have been seriously ill or have died  ?Exploration of personal, cultural or spiritual beliefs that might influence medical decisions  ?Exploration of goals of care in the event of a sudden injury or illness  ?CODE STATUS: DNR ? ?Education on palliative medicine. Daughter would like for patient to remain in the home, monitor for changes and declines. Palliative Medicine will continue to provide supportive care,  provide symptom management.  ? ?Symptom Management/Plan: ? ?Generalized weakness- family to continue assisting with daily care needs. Encourage use of w/c for locomotion. Declines therapy at this time. Monitor for falls/safety. ? ?Hypertension-blood pressure medications are currently being held due to low blood pressures. Family to check blood pressures routinely. Education provided on fluid intake.  ? ?Wounds to Left foot-continue wound care as directed by wound clinic. Follow up with wound clinic as scheduled.  ? ?Subconjunctival hemorrhage-discussed possible causes; patient is status post fall out of bed although it is unsure if she hit her face. Daughter to monitor for worsening.  ? ?Follow up Palliative Care Visit: Palliative care will continue to follow for complex medical decision making, advance care planning, and clarification of goals. Return in 2 weeks or prn. ? ? ?This visit was coded based on medical decision making (MDM). ? ?PPS: 40% ? ?HOSPICE ELIGIBILITY/DIAGNOSIS: TBD ? ?Chief Complaint: Palliative Medicine initial consult.  ? ?HISTORY OF PRESENT ILLNESS:  [name] [name] is a 88 y.o. year old female  with CAD, hypertension, PVD, DVT of right lower extremity, GERD, hypothyroidism, CKD 4, gout. ? ?Patient resides at home with family. Daughter reports patient with functional decline since the beginning of the year. Patient seen in wound clinic weekly due to chronic wounds to left foot.  Patient requires assistance with all ADLs. Unsteady gait, ambulates very short distances or uses wheelchair most days. Fair appetite endorsed; she is mostly eating  soft foods, having more trouble chewing due to only having top dentures. Patient is status post fall out of bed on Saturday. Patient has redness to conjunctiva; patient and daughter are unsure if she hit her face/head. Patient denies pain; no bruising present. Daughter reports she does become dizzy at times, lightheaded.  Her blood pressure medicines were  recently stopped due to low blood pressures. Daughter reports blood pressure 86/38. Most recent blood pressure is 113/65 per daughter. ? ?History obtained from review of EMR, discussion with primary team, and interview with family, facility staff/caregiver and/or Ms. Walck.  ?I reviewed available labs, medications, imaging, studies and related documents from the EMR.  Records reviewed and summarized above.  ? ?ROS ? ?General: NAD ?EYES: impaired vision ?ENMT: denies dysphagia ?Cardiovascular: denies chest pain, DOE ?Pulmonary: denies cough ?Abdomen: denies constipation ?GU: denies dysuria, endorses incontinence of urine ?MSK:  + weakness ?Skin: denies rashes; wounds to left foot ?Neurological: denies pain, denies insomnia ?Psych: Endorses stable mood ?Heme/lymph/immuno: denies bruises, abnormal bleeding ? ?Physical Exam: ?Constitutional: NAD ?General: frail appearing, thin ?EYES: left conjunctiva hemorrhage, lids intact, no discharge  ?ENMT: intact hearing ?CV: no LE edema ?Pulmonary: no increased work of breathing, no cough, room air ?GU: deferred ?MSK: moves all extremities, ambulatory ?Skin: no rashes or wounds on visible skin; wounds to foot not visualized ?Neuro: +generalized weakness, A & O to person, place, familiars ?Psych: non-anxious affect, pleasant ?Hem/lymph/immuno: no widespread bruising ?CURRENT PROBLEM LIST:  ?Patient Active Problem List  ? Diagnosis Date Noted  ? Acute deep vein thrombosis (DVT) of calf muscle vein of right lower extremity ([facility]) 12/03/2020  ? Closed displaced fracture of right femoral neck ([facility]) 03/27/2020  ? CKD (chronic kidney disease) stage 4, GFR 15-29 ml/min ([facility]) 03/27/2020  ? Cellulitis 03/27/2020  ? Localized edema 01/10/2020  ? Peripheral vascular disease ([facility]) 06/14/2019  ? Female bladder prolapse 05/13/2019  ? Constipation 08/11/2018  ? Right foot ulcer ([facility]) 01/24/2015  ? Esophageal reflux 08/16/2014  ? Obesity 08/16/2014  ? Osteopenia 08/16/2014  ? Varicose veins of  both legs with edema 08/16/2014  ? Prediabetes 10/02/2008  ? CAD in native artery 09/26/2008  ? Gout 09/26/2008  ? History of tobacco use 09/26/2008  ? Hypertension 09/26/2008  ? Hypothyroid 09/26/2008  ? Hypercholesterolemia without hypertriglyceridemia 09/26/2008  ? ?PAST MEDICAL HISTORY:  ?Active Ambulatory Problems  ?  Diagnosis Date Noted  ? CAD in native artery 09/26/2008  ? Esophageal reflux 08/16/2014  ? Gout 09/26/2008  ? History of tobacco use 09/26/2008  ? Hypertension 09/26/2008  ? Hypothyroid 09/26/2008  ? Obesity 08/16/2014  ? Osteopenia 08/16/2014  ? Prediabetes 10/02/2008  ? Hypercholesterolemia without hypertriglyceridemia 09/26/2008  ? Varicose veins of both legs with edema 08/16/2014  ? Right foot ulcer ([facility]) 01/24/2015  ? Constipation 08/11/2018  ? Female bladder prolapse 05/13/2019  ? Peripheral vascular disease ([facility]) 06/14/2019  ? Localized edema 01/10/2020  ? Closed displaced fracture of right femoral neck ([place] [facility]) 03/27/2020  ? CKD (chronic kidney disease) stage 4, GFR 15-29 ml/min ([facility]) 03/27/2020  ? Cellulitis 03/27/2020  ? Acute deep vein thrombosis (DVT) of calf muscle vein of right lower extremity ([place] [facility]) 12/03/2020  ? ?Resolved Ambulatory Problems  ?  Diagnosis Date Noted  ? Anemia 08/16/2014  ? Chronic kidney disease (CKD), stage III (moderate) ([facility]) 08/16/2014  ? H/O acute myocardial infarction 08/16/2014  ? ?Past Medical History:  ?Diagnosis Date  ? Hyperlipidemia   ? Myocardial infarction [facility])   ? ?SOCIAL HX:  ?Social History  ? ?  Tobacco Use  ? Smoking status: Former  ?  Types: Cigarettes  ?  Quit date: 02/18/2000  ?  Years since quitting: 21.2  ? Smokeless tobacco: Never  ?Substance Use Topics  ? Alcohol use: No  ?  Alcohol/week: 0.0 standard drinks  ? ?FAMILY HX:  ?Family History  ?Problem Relation Age of Onset  ? Ovarian cancer Mother   ? Alcohol abuse Sister   ? Alzheimer's disease Sister   ?   ? ?ALLERGIES:  ?Allergies  ?Allergen Reactions  ? Atorvastatin Other (See Comments)  ?   myalgias/arthralgias.  ? Lovastatin   ?  Other reaction(s): Muscle Pain  ? Simvastatin   ?  Elevated CK  ?   ?PERTINENT MEDICATIONS:  ?Outpatient Encounter Medications as of 05/21/2021  ?Medication Sig  ? all

## 2021-05-30 ENCOUNTER — Encounter: Payer: PPO | Admitting: Physician Assistant

## 2021-06-06 ENCOUNTER — Encounter: Payer: PPO | Admitting: Physician Assistant

## 2021-06-06 DIAGNOSIS — L89893 Pressure ulcer of other site, stage 3: Secondary | ICD-10-CM | POA: Diagnosis not present

## 2021-06-06 NOTE — Progress Notes (Addendum)
Titzer, Anapaola L. ([id number]) ?Visit Report for 06/06/2021 ?Chief Complaint Document Details ?Patient Name: [name] [name], [name] [name] ?Date of Service: 06/06/2021 1:00 PM ?Medical Record Number: [id number] ?Patient Account Number: [id number] ?Date of Birth/Sex: July 27, 1926 (88 y.o. F) ?Treating RN: Cornell Barman ?Primary Care Provider: [name] Other Clinician: ?Referring Provider: [name] ?Treating Provider/Extender: Jeri Cos ?Weeks in Treatment: 49 ?Information Obtained from: Patient ?Chief Complaint ?Left Heel Pressure Ulcer ?Electronic Signature(s) ?Signed: 06/06/2021 12:52:21 PM By: Worthy Keeler PA-C ?Entered By: Worthy Keeler on 06/06/2021 12:52:21 ?[name] [name], [name] ([id number]) ?-------------------------------------------------------------------------------- ?HPI Details ?Patient Name: [name] [name], [name] [name] ?Date of Service: 06/06/2021 1:00 PM ?Medical Record Number: [id number] ?Patient Account Number: [id number] ?Date of Birth/Sex: 1926/12/14 (88 y.o. F) ?Treating RN: Cornell Barman ?Primary Care Provider: [name] Other Clinician: ?Referring Provider: [name] ?Treating Provider/Extender: Jeri Cos ?Weeks in Treatment: 49 ?History of Present Illness ?HPI Description: 88 year old patient was recently seen by the PCPs office for significant pain right great toe which has been going on since July. ?She was initially treated with Keflex which she did not complete and after the office visit this time she has been put on doxycycline. at the time of ?her visit she was found to have a ulcer on the plantar surface of the right great toe and also had a pyogenic granuloma over this area. ?X-ray of the right foot done 12/29/2014 -- IMPRESSION:Soft-tissue swelling and ulceration right great toe. No underlying ?bony lytic lesion identified. If osteomyelitis remains of clinical concern MRI can be obtained. ?Past medical history significant for anemia, chronic kidney disease stage III, obesity, varicose veins,  coronary artery disease, gout, history of ?nicotine addiction given up smoking in 2002, hypertension, status post cardiac catheterization, status post abdominal hysterectomy, ?cholecystectomy and tonsillectomy. ?hemoglobin A1c done in August was 5.8 ?01/22/2015 -- at this stage the [facility] Walking boat was going to cost them significant amount of money and they want to defer using that at the ?present time. ?01/29/2015 -- she had a podiatry appointment and they have trimmed her toenails. She has not heard back from the vascular office regarding her ?venous duplex study and I have asked them to call personally so that they can get the appointment soon. ?02/06/2015 -- they have made contact with the vascular office and from what I understand that test has been done but the report is pending. ?02/19/2014 -- the vascular test is scheduled for tomorrow ?Readmission: ?06/26/2020 upon evaluation today patient appears for initial evaluation in her clinic that she has been here before in 2016 and has been quite ?sometime. She did have a fractured hip in February on the 23rd 2022. Subsequently this had to be pinned and she ended up with a pressure ?injury on her heel following when she was using her foot to help move her around in the bed. Subsequently this has led to the wound that she has ?been dealing with since that time. She lives at home with her daughter currently she does have dementia. 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 ?07/03/2020 upon evaluation today patient appears to be doing well with regard to her heel ulcer. She did have arterial studies they appear to be ?doing excellent it was premature normal across the board with TBI's in the 90s and ABIs well within normal range. Nonetheless there does not ?appear to be any signs of arterial insufficiency whatsoever. With that being said  the patient does have also signs of improvement there is some ?necrotic tissue in the base of the wound number to try to clear some of that away today I do believe the Iodoflex/Iodosorb is doing well. ?5/24; difficult punched out wound on the left medial heel. She has been using Iodoflex ?07/17/2020 upon evaluation today patient appears to be doing well at this point in regard to her wound. I do feel like this is a little bit deeper but ?again that is what is expected as we continue with the Iodoflex I think that this is just going to get deeper until we get to the base of the wound. ?With that being said I think we are getting much closer to the base of the wound where we can have healthier tissue that we get a be managing ?here which that will be awesome. In the meantime I am not surprised by what I am seeing and in fact the wound appears to be better as ?compared to previous findings. ?07/24/2020 upon evaluation today patient appears to be doing well with regard to her wound. Overall I am extremely pleased with where things ?stand today. I do not see any signs of active infection which is great and overall I think that the patient is making good progress. There is good to ?be some need for sharp debridement today. ?07/31/2020 upon evaluation today patient appears to be doing better in regard to her heel ulcer. She has been tolerating the dressing changes ?without complication. Fortunately there does not appear to be any signs of active infection which is great and overall very pleased in this regard. ?No fevers, chills, nausea, vomiting, or diarrhea. ?08/14/2020 upon evaluation today patient appears to be doing better in regard to her heel ulcer. I am very pleased in that regard. Unfortunately ?she still has a slight deep tissue injury in regard to the medial portion of her foot over the same area. Unfortunately I think this is something that ?if were not careful it was can open up into the wound. That is my main  concern here based on what I see. Fortunately there does not appear to be ?any evidence of active infection which is great news and overall very pleased with where things stand at this point. ?08/21/2020 upon evaluation today patient appears to be doing well with regard to her wound. She has been tolerating the dressing changes without ?complication. Fortunately there does not appear to be any signs of active infection at this time. No fevers, chills, nausea, vomiting, or diarrhea. I ?do believe the compression wrap was beneficial for her. ?08/28/2020 upon evaluation today patient appears to be doing well with regard to her wounds currently. Fortunately there does not appear to be ?any signs of active infection at this time. No fevers, chills, nausea, vomiting, or diarrhea. With that being said I think that her leg is doing quite ?well to be honest. ?09/04/2020 upon evaluation today patient appears to be doing well with regard to her wound on the heel. This is showing signs of good epithelial ?growth although there is probably can it be an indention where this heals that is okay as long as we get it closed. Fortunately I do not see any ?evidence of infection at this point. ?09/13/2020 upon evaluation today patient appears to be doing well with regard to her wounds. She has been tolerating the dressing changes ?[name] [name], [name] ([id number]) ?without complication. Fortunately he is actually doing extremely well and I think she is making  great progress this is measuring smaller. I would ?recommend that such that we continue with the [facility] likely since he is doing so well. ?09/18/2020 upon evaluation today patient appears to be doing well with regard to her heel ulcer. She is making good progress and I am very ?pleased with what we see today. I think the Hydrofera Blue is still doing excellent. ?10/02/2020 upon evaluation today patient's wound actually appears to be showing signs of good improvement in regard to the  heel. I am very ?pleased in this regard. With that being said I do believe that the area which is somewhat been stable and dry is starting to lift up and beginning to ?clear this away as well that is on the

## 2021-06-06 NOTE — Progress Notes (Addendum)
Seelig, Keirston L. ([id number]) ?Visit Report for 06/06/2021 ?Arrival Information Details ?Patient Name: [name] [name], [name] [name] ?Date of Service: 06/06/2021 1:00 PM ?Medical Record Number: [id number] ?Patient Account Number: [id number] ?Date of Birth/Sex: 02-28-1926 (88 y.o. F) ?Treating RN: Cornell Barman ?Primary Care Takira Sherrin: [name] Other Clinician: ?Referring Hibo Blasdell: [name] ?Treating Shadavia Dampier/Extender: Jeri Cos ?Weeks in Treatment: 49 ?Visit Information History Since Last Visit ?Added or deleted any medications: No ?Patient Arrived: Wheel Chair ?Has Dressing in Place as Prescribed: Yes ?Arrival Time: 12:52 ?Pain Present Now: No ?Accompanied By: daughter ?Transfer Assistance: Manual ?Patient Identification Verified: Yes ?Secondary Verification Process Completed: Yes ?Patient Requires Transmission-Based No ?Precautions: ?Patient Has Alerts: Yes ?Patient Alerts: NOT diabetic ?ABI R1.18 L1.25 ?06/27/20 ?Electronic Signature(s) ?Signed: 06/06/2021 4:12:25 PM By: Gretta Cool, BSN, RN, CWS, Kim RN, BSN ?Entered By: Gretta Cool, BSN, RN, CWS, Kim on 06/06/2021 12:56:15 ?Capasso, Maeghan L. ([id number]) ?-------------------------------------------------------------------------------- ?Clinic Level of Care Assessment Details ?Patient Name: [name] [name], [name] [name] ?Date of Service: 06/06/2021 1:00 PM ?Medical Record Number: [id number] ?Patient Account Number: [id number] ?Date of Birth/Sex: Feb 15, 1927 (88 y.o. F) ?Treating RN: Cornell Barman ?Primary Care Brynnly Bonet: [name] Other Clinician: ?Referring Gualberto Wahlen: [name] ?Treating Britiney Blahnik/Extender: Jeri Cos ?Weeks in Treatment: 49 ?Clinic Level of Care Assessment Items ?TOOL 4 Quantity Score ?[]  - Use when only an EandM is performed on FOLLOW-UP visit 0 ?ASSESSMENTS - Nursing Assessment / Reassessment ?X - Reassessment of Co-morbidities (includes updates in patient status) 1 10 ?X- 1 5 ?Reassessment of Adherence to Treatment Plan ?ASSESSMENTS - Wound and Skin Assessment /  Reassessment ?[]  - Simple Wound Assessment / Reassessment - one wound 0 ?X- 2 5 ?Complex Wound Assessment / Reassessment - multiple wounds ?[]  - 0 ?Dermatologic / Skin Assessment (not related to wound area) ?ASSESSMENTS - Focused Assessment ?[]  - Circumferential Edema Measurements - multi extremities 0 ?[]  - 0 ?Nutritional Assessment / Counseling / Intervention ?[]  - 0 ?Lower Extremity Assessment (monofilament, tuning fork, pulses) ?[]  - 0 ?Peripheral Arterial Disease Assessment (using hand held doppler) ?ASSESSMENTS - Ostomy and/or Continence Assessment and Care ?[]  - Incontinence Assessment and Management 0 ?[]  - 0 ?Ostomy Care Assessment and Management (repouching, etc.) ?PROCESS - Coordination of Care ?X - Simple Patient / Family Education for ongoing care 1 15 ?[]  - 0 ?Complex (extensive) Patient / Family Education for ongoing care ?X- 1 10 ?Staff obtains Consents, Records, Test Results / Process Orders ?[]  - 0 ?Staff telephones [place], Nursing Homes / Clarify orders / etc ?[]  - 0 ?Routine Transfer to another Facility (non-emergent condition) ?[]  - 0 ?Routine Hospital Admission (non-emergent condition) ?[]  - 0 ?New Admissions / Biomedical engineer / Ordering NPWT, Apligraf, etc. ?[]  - 0 ?Emergency Hospital Admission (emergent condition) ?X- 1 10 ?Simple Discharge Coordination ?[]  - 0 ?Complex (extensive) Discharge Coordination ?PROCESS - Special Needs ?[]  - Pediatric / Minor Patient Management 0 ?[]  - 0 ?Isolation Patient Management ?[]  - 0 ?Hearing / Language / Visual special needs ?[]  - 0 ?Assessment of Community assistance (transportation, D/C planning, etc.) ?[]  - 0 ?Additional assistance / Altered mentation ?[]  - 0 ?Support Surface(s) Assessment (bed, cushion, seat, etc.) ?INTERVENTIONS - Wound Cleansing / Measurement ?Barbour, Jamillia L. ([id number]) ?[]  - 0 ?Simple Wound Cleansing - one wound ?X- 2 5 ?Complex Wound Cleansing - multiple wounds ?X- 1 5 ?Wound Imaging (photographs - any number of  wounds) ?[]  - 0 ?Wound Tracing (instead of photographs) ?[]  - 0 ?Simple Wound Measurement - one wound ?X- 2 5 ?Complex Wound Measurement - multiple wounds ?INTERVENTIONS - Wound Dressings ?[]  -  Small Wound Dressing one or multiple wounds 0 ?[]  - 0 ?Medium Wound Dressing one or multiple wounds ?X- 1 20 ?Large Wound Dressing one or multiple wounds ?[]  - 0 ?Application of Medications - topical ?[]  - 0 ?Application of Medications - injection ?INTERVENTIONS - Miscellaneous ?[]  - External ear exam 0 ?[]  - 0 ?Specimen Collection (cultures, biopsies, blood, body fluids, etc.) ?[]  - 0 ?Specimen(s) / Culture(s) sent or taken to Lab for analysis ?[]  - 0 ?Patient Transfer (multiple staff / Civil Service fast streamer / Similar devices) ?[]  - 0 ?Simple Staple / Suture removal (25 or less) ?[]  - 0 ?Complex Staple / Suture removal (26 or more) ?[]  - 0 ?Hypo / Hyperglycemic Management (close monitor of Blood Glucose) ?[]  - 0 ?Ankle / Brachial Index (ABI) - do not check if billed separately ?X- 1 5 ?Vital Signs ?Has the patient been seen at the hospital within the last three years: Yes ?Total Score: 110 ?Level Of Care: New/Established - Level ?3 ?Electronic Signature(s) ?Signed: 06/06/2021 4:12:25 PM By: Gretta Cool, BSN, RN, CWS, Kim RN, BSN ?Entered By: Gretta Cool, BSN, RN, CWS, Kim on 06/06/2021 13:26:38 ?Eastman, Naomie L. ([id number]) ?-------------------------------------------------------------------------------- ?Encounter Discharge Information Details ?Patient Name: [name] [name], [name] [name] ?Date of Service: 06/06/2021 1:00 PM ?Medical Record Number: [id number] ?Patient Account Number: [id number] ?Date of Birth/Sex: May 12, 1926 (88 y.o. F) ?Treating RN: Cornell Barman ?Primary Care Kaisa Wofford: [name] Other Clinician: ?Referring Sheelah Ritacco: [name] ?Treating Tishana Clinkenbeard/Extender: Jeri Cos ?Weeks in Treatment: 49 ?Encounter Discharge Information Items ?Discharge Condition: Stable ?Ambulatory Status: Wheelchair ?Discharge Destination: Home ?Transportation:  Private Auto ?Accompanied By: self ?Schedule Follow-up Appointment: Yes ?Clinical Summary of Care: ?Electronic Signature(s) ?Signed: 06/06/2021 4:12:25 PM By: Gretta Cool, BSN, RN, CWS, Kim RN, BSN ?Entered By: Gretta Cool, BSN, RN, CWS, Kim on 06/06/2021 13:28:03 ?Kaiser, Raahi L. ([id number]) ?-------------------------------------------------------------------------------- ?Lower Extremity Assessment Details ?Patient Name: [name] [name], [name] [name] ?Date of Service: 06/06/2021 1:00 PM ?Medical Record Number: [id number] ?Patient Account Number: [id number] ?Date of Birth/Sex: Apr 02, 1926 (88 y.o. F) ?Treating RN: Cornell Barman ?Primary Care Rosalene Wardrop: [name] Other Clinician: ?Referring Daniela Hernan: [name] ?Treating Valleri Hendricksen/Extender: Jeri Cos ?Weeks in Treatment: 49 ?Vascular Assessment ?Pulses: ?Dorsalis Pedis ?Palpable: [Left:Yes] ?Electronic Signature(s) ?Signed: 06/06/2021 4:12:25 PM By: Gretta Cool, BSN, RN, CWS, Kim RN, BSN ?Entered By: Gretta Cool, BSN, RN, CWS, Kim on 06/06/2021 13:04:16 ?Gage, Anaijah L. ([id number]) ?-------------------------------------------------------------------------------- ?Multi Wound Chart Details ?Patient Name: [name] [name], [name] [name] ?Date of Service: 06/06/2021 1:00 PM ?Medical Record Number: [id number] ?Patient Account Number: [id number] ?Date of Birth/Sex: [birth year] (88 y.o. F) ?Treating RN: Cornell Barman ?Primary Care [place] Flight: [name] Other Clinician: ?Referring Vibhav Waddill: [name] ?Treating Patrici Minnis/Extender: Jeri Cos ?Weeks in Treatment: 49 ?Vital Signs ?Height(in): 58 ?Pulse(bpm): 96 ?Weight(lbs): 137 ?Blood Pressure(mmHg): 120/70 ?Body Mass Index(BMI): 28.6 ?Temperature(??F): 98.0 ?Respiratory Rate(breaths/min): 16 ?Photos: [N/A:N/A] ?Wound Location: Left Foot Left Toe Great N/A ?Wounding Event: Pressure Injury Pressure Injury N/A ?Primary Etiology: Pressure Ulcer Pressure Ulcer N/A ?Comorbid History: Cataracts, Anemia, Coronary Artery Cataracts, Anemia, Coronary Artery N/A ?Disease,  Hypertension, Myocardial Disease, Hypertension, Myocardial ?Infarction, End Stage Renal Infarction, End Stage Renal ?Disease, Gout, Osteoarthritis, Disease, Gout, Osteoarthritis, ?Dementia Dementia ?Date Acquired: 10/02/2020 4

## 2021-06-10 IMAGING — CR DG CHEST 1V
1 series · 1 of 1 positions shown · non-contrast
Comparison: None.

CLINICAL DATA: Preoperative evaluation, former smoker

EXAM:
CHEST  1 VIEW

[dg chest 1 view]
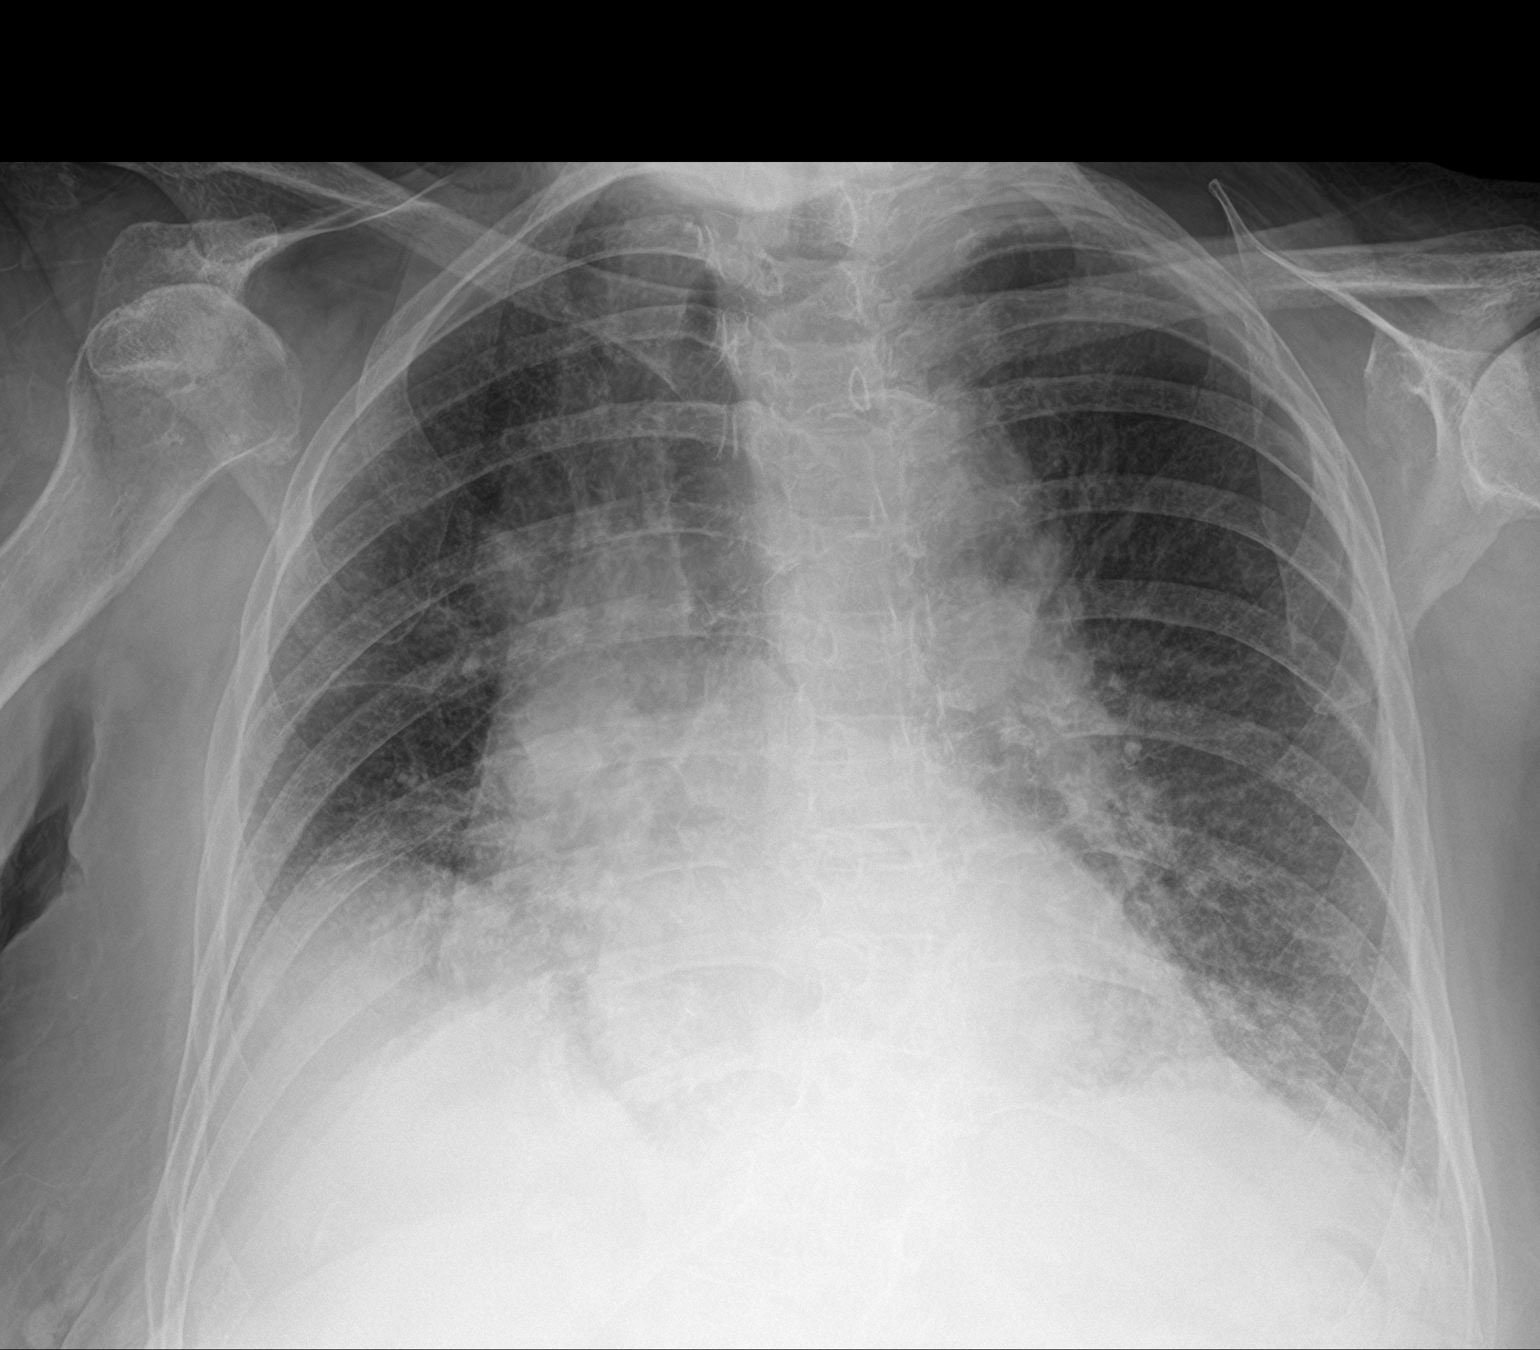

[1 of 1 positions shown; findings below may reference images not displayed]

FINDINGS: Interstitial prominence, greatest at the lung bases. No significant
pleural effusion. Cardiomediastinal contours are within normal
limits.
IMPRESSION: Interstitial prominence, greatest at the lung bases. In the absence
of acute symptoms, may reflect chronic changes of COPD or fibrosis.

## 2021-06-13 ENCOUNTER — Encounter: Payer: PPO | Admitting: Internal Medicine

## 2021-06-13 DIAGNOSIS — L89893 Pressure ulcer of other site, stage 3: Secondary | ICD-10-CM | POA: Diagnosis not present

## 2021-06-13 DIAGNOSIS — L89892 Pressure ulcer of other site, stage 2: Secondary | ICD-10-CM | POA: Diagnosis not present

## 2021-06-13 NOTE — Progress Notes (Signed)
Canto, Alea L. ([id number]) ?Visit Report for 06/13/2021 ?Arrival Information Details ?Patient Name: [name] [name], [name] [name] ?Date of Service: 06/13/2021 1:00 PM ?Medical Record Number: [id number] ?Patient Account Number: [id number] ?Date of Birth/Sex: 11/02/1926 (88 y.o. F) ?Treating RN: Donnamarie Poag ?Primary Care Dean Wonder: [name] Other Clinician: ?Referring Sharifah Champine: [name] ?Treating Uriel Horkey/Extender: Ricard Dillon ?Weeks in Treatment: 50 ?Visit Information History Since Last Visit ?Added or deleted any medications: No ?Patient Arrived: Wheel Chair ?Had a fall or experienced change in No ?Arrival Time: 12:58 ?activities of daily living that may affect ?Accompanied By: daughter ?risk of falls: ?Transfer Assistance: Manual ?Hospitalized since last visit: No ?Patient Identification Verified: Yes ?Has Dressing in Place as Prescribed: Yes ?Secondary Verification Process Completed: Yes ?Pain Present Now: No ?Patient Requires Transmission-Based No ?Precautions: ?Patient Has Alerts: Yes ?Patient Alerts: NOT diabetic ?ABI R1.18 L1.25 ?06/27/20 ?Electronic Signature(s) ?Signed: 06/13/2021 4:31:34 PM By: Donnamarie Poag ?Entered ByDonnamarie Poag on 06/13/2021 12:59:10 ?Topping, Izzabella L. ([id number]) ?-------------------------------------------------------------------------------- ?Encounter Discharge Information Details ?Patient Name: [name] [name], [name] [name] ?Date of Service: 06/13/2021 1:00 PM ?Medical Record Number: [id number] ?Patient Account Number: [id number] ?Date of Birth/Sex: 1926-11-30 (88 y.o. F) ?Treating RN: Donnamarie Poag ?Primary Care Macklen Wilhoite: [name] Other Clinician: ?Referring Remonia Otte: [name] ?Treating Solimar Maiden/Extender: Ricard Dillon ?Weeks in Treatment: 50 ?Encounter Discharge Information Items Post Procedure Vitals ?Discharge Condition: Stable ?Temperature (?F): 98.1 ?Ambulatory Status: Wheelchair ?Pulse (bpm): 73 ?Discharge Destination: Home ?Respiratory Rate (breaths/min):  16 ?Transportation: Private Auto ?Blood Pressure (mmHg): 110/65 ?Accompanied By: daughter ?Schedule Follow-up Appointment: Yes ?Clinical Summary of Care: ?Electronic Signature(s) ?Signed: 06/13/2021 4:31:34 PM By: Donnamarie Poag ?Entered ByDonnamarie Poag on 06/13/2021 13:33:38 ?Pisarski, Matsue L. ([id number]) ?-------------------------------------------------------------------------------- ?Lower Extremity Assessment Details ?Patient Name: [name] [name], [name] [name] ?Date of Service: 06/13/2021 1:00 PM ?Medical Record Number: [id number] ?Patient Account Number: [id number] ?Date of Birth/Sex: 07-20-1926 (88 y.o. F) ?Treating RN: Donnamarie Poag ?Primary Care Oluwateniola Leitch: [name] Other Clinician: ?Referring Cheyan Frees: [name] ?Treating Elo Marmolejos/Extender: Ricard Dillon ?Weeks in Treatment: 50 ?Edema Assessment ?Assessed: [Left: Yes] [Right: No] ?[Left: Edema] [Right: :] ?Electronic Signature(s) ?Signed: 06/13/2021 4:31:34 PM By: Donnamarie Poag ?Entered ByDonnamarie Poag on 06/13/2021 13:08:49 ?Eblin, Lashica L. ([id number]) ?-------------------------------------------------------------------------------- ?Multi Wound Chart Details ?Patient Name: [name] [name], [name] [name] ?Date of Service: 06/13/2021 1:00 PM ?Medical Record Number: [id number] ?Patient Account Number: [id number] ?Date of Birth/Sex: [birth year] (88 y.o. F) ?Treating RN: Donnamarie Poag ?Primary Care Coretta Leisey: [name] Other Clinician: ?Referring Ronne Savoia: [name] ?Treating Camilla Skeen/Extender: Ricard Dillon ?Weeks in Treatment: 50 ?Vital Signs ?Height(in): 58 ?Pulse(bpm): 73 ?Weight(lbs): 137 ?Blood Pressure(mmHg): 110/65 ?Body Mass Index(BMI): 28.6 ?Temperature(??F): 98.1 ?Respiratory Rate(breaths/min): 16 ?Photos: [N/A:N/A] ?Wound Location: Left Foot Left Toe Great N/A ?Wounding Event: Pressure Injury Pressure Injury N/A ?Primary Etiology: Pressure Ulcer Pressure Ulcer N/A ?Comorbid History: Cataracts, Anemia, Coronary Artery Cataracts, Anemia, Coronary Artery  N/A ?Disease, Hypertension, Myocardial Disease, Hypertension, Myocardial ?Infarction, End Stage Renal Infarction, End Stage Renal ?Disease, Gout, Osteoarthritis, Disease, Gout, Osteoarthritis, ?Dementia Dementia ?Date Acquired: 10/02/2020 06/03/2021 N/A ?Weeks of Treatment: 36 1 N/A ?Wound Status: Open Open N/A ?Wound Recurrence: No No N/A ?Measurements L x W x D (cm) 0.9x1x0.2 1x1x0.1 N/A ?Area (cm?) : 0.707 0.785 N/A ?Volume (cm?) : 0.141 0.079 N/A ?% Reduction in Area: -125.20% 40.50% N/A ?% Reduction in Volume: -354.80% 40.20% N/A ?Classification: Category/Stage III Category/Stage II N/A ?Exudate Amount: Medium Medium N/A ?Exudate Type: Serosanguineous Serous N/A ?Exudate Color: red, brown amber N/A ?Wound Margin: Thickened Flat and Intact N/A ?Granulation Amount: Medium (34-66%) None Present (0%) N/A ?Necrotic  Amount: Medium (34-66%) Large (67-100%) N/A ?Necrotic Tissue: Adherent Slough Eschar N/A ?Exposed Structures: ?Fat Layer (Subcutaneous Tissue): ?Fat Layer (Subcutaneous Tissue): N/A ?Yes Yes ?Fascia: No ?Fascia: No ?Tendon: No ?Tendon: No ?Muscle: No ?Muscle: No ?Joint: No ?Joint: No ?Bone: No ?Bone: No ?Epithelialization: Small (1-33%) None N/A ?Treatment Notes ?Electronic Signature(s) ?Signed: 06/13/2021 4:31:34 PM By: Donnamarie Poag ?Entered ByDonnamarie Poag on 06/13/2021 13:09:46 ?Vinas, Staphanie L. ([id number]) ?Garron, Amri L. ([id number]) ?-------------------------------------------------------------------------------- ?Multi-Disciplinary Care Plan Details ?Patient Name: [name] [name], [name] [name] ?Date of Service: 06/13/2021 1:00 PM ?Medical Record Number: [id number] ?Patient Account Number: [id number] ?Date of Birth/Sex: 1926/03/21 (88 y.o. F) ?Treating RN: Donnamarie Poag ?Primary Care Vincentina Sollers: [name] Other Clinician: ?Referring Tomeca Helm: [name] ?Treating Saahir Prude/Extender: Ricard Dillon ?Weeks in Treatment: 50 ?Active Inactive ?Electronic Signature(s) ?Signed: 06/13/2021 4:31:34 PM By: Donnamarie Poag ?Entered ByDonnamarie Poag on 06/13/2021 13:09:29 ?Stogner, Breniya L. ([id number]) ?-------------------------------------------------------------------------------- ?Non-Wound Condition Assessment Details ?Patient Name: [name] [name], [name] [name] ?Date of Service: 06/13/2021 1:00 PM ?Medical Record Number: [id number] ?Patient Account Number: [id number] ?Date of Birth/Sex: 1926/04/17 (88 y.o. F) ?Treating RN: Donnamarie Poag ?Primary Care Moussa Wiegand: [name] Other Clinician: ?Referring Shyleigh Daughtry: [name] ?Treating Jazelle Achey/Extender: Ricard Dillon ?Weeks in Treatment: 50 ?Non-Wound Condition: ?Condition: Suspected Deep Tissue Injury ?Location: Foot ?Side: Left ?Notes: Left medial foot ?Photos ?Electronic Signature(s) ?Signed: 06/13/2021 4:31:34 PM By: Donnamarie Poag ?Entered ByDonnamarie Poag on 06/13/2021 13:08:30 ?Villanueva, Jonique L. ([id number]) ?-------------------------------------------------------------------------------- ?Pain Assessment Details ?Patient Name: [name] [name], [name] [name] ?Date of Service: 06/13/2021 1:00 PM ?Medical Record Number: [id number] ?Patient Account Number: [id number] ?Date of Birth/Sex: July 23, 1926 (88 y.o. F) ?Treating RN: Donnamarie Poag ?Primary Care Kaelen Brennan: [name] Other Clinician: ?Referring Fard Borunda: [name] ?Treating Kamerin Grumbine/Extender: Ricard Dillon ?Weeks in Treatment: 50 ?Active Problems ?Location of Pain Severity and Description of Pain ?Patient Has Paino No ?Site Locations ?Rate the pain. ?Current Pain Level: 0 ?Pain Management and Medication ?Current Pain Management: ?Electronic Signature(s) ?Signed: 06/13/2021 4:31:34 PM By: Donnamarie Poag ?Entered ByDonnamarie Poag on 06/13/2021 13:04:59 ?Kerschner, Iyari L. ([id number]) ?-------------------------------------------------------------------------------- ?Patient/Caregiver Education Details ?Patient Name: [name] [name], [name] [name] ?Date of Service: 06/13/2021 1:00 PM ?Medical Record Number: [id number] ?Patient Account Number: [id number] ?Date of  Birth/Gender: 06/16/1926 (88 y.o. F) ?Treating RN: Donnamarie Poag ?Primary Care Physician: [name] Other Clinician: ?Referring Physician: [name] ?Treating Physician/Extender: Ricard Dillon ?Weeks in Treatment:

## 2021-06-13 NOTE — Progress Notes (Signed)
Laughridge, Deshea L. ([id number]) ?Visit Report for 06/13/2021 ?Debridement Details ?Patient Name: [name] [name], [name] [name] ?Date of Service: 06/13/2021 1:00 PM ?Medical Record Number: [id number] ?Patient Account Number: [id number] ?Date of Birth/Sex: 1926/03/12 (88 y.o. F) ?Treating RN: Donnamarie Poag ?Primary Care Provider: [name] Other Clinician: ?Referring Provider: [name] ?Treating Provider/Extender: Ricard Dillon ?Weeks in Treatment: 50 ?Debridement Performed for ?Wound #3 Left Foot ?Assessment: ?Performed By: Physician Ricard Dillon, MD ?Debridement Type: Debridement ?Level of Consciousness (Pre- ?Awake and Alert ?procedure): ?Pre-procedure Verification/Time Out ?Yes - 13:19 ?Taken: ?Start Time: 13:22 ?Pain Control: Lidocaine ?Total Area Debrided (L x W): 0.9 (cm) x 1 (cm) = 0.9 (cm?) ?Tissue and other material ?Viable, Non-Viable, Subcutaneous ?debrided: ?Level: Skin/Subcutaneous Tissue ?Debridement Description: Excisional ?Instrument: Curette ?Bleeding: Minimum ?Hemostasis Achieved: Pressure ?Response to Treatment: Procedure was tolerated well ?Level of Consciousness (Post- ?Awake and Alert ?procedure): ?Post Debridement Measurements of Total Wound ?Length: (cm) 0.9 ?Stage: Category/Stage III ?Width: (cm) 1 ?Depth: (cm) 0.2 ?Volume: (cm?) 0.141 ?Character of Wound/Ulcer Post Debridement: Improved ?Post Procedure Diagnosis ?Same as Pre-procedure ?Electronic Signature(s) ?Signed: 06/13/2021 4:18:52 PM By: Linton Ham MD ?Signed: 06/13/2021 4:31:34 PM By: Donnamarie Poag ?Entered By: Linton Ham on 06/13/2021 13:28:13 ?Kayes, [name] ([id number]) ?-------------------------------------------------------------------------------- ?Debridement Details ?Patient Name: [name] [name], [name] [name] ?Date of Service: 06/13/2021 1:00 PM ?Medical Record Number: [id number] ?Patient Account Number: [id number] ?Date of Birth/Sex: February 06, 1927 (88 y.o. F) ?Treating RN: Donnamarie Poag ?Primary Care Provider: [name] Other  Clinician: ?Referring Provider: [name] ?Treating Provider/Extender: Ricard Dillon ?Weeks in Treatment: 50 ?Debridement Performed for ?Wound #5 Left Toe Great ?Assessment: ?Performed By: Physician Ricard Dillon, MD ?Debridement Type: Debridement ?Level of Consciousness (Pre- ?Awake and Alert ?procedure): ?Pre-procedure Verification/Time Out ?Yes - 13:19 ?Taken: ?Start Time: 13:20 ?Pain Control: Lidocaine ?Total Area Debrided (L x W): 1 (cm) x 1 (cm) = 1 (cm?) ?Tissue and other material ?Viable, Non-Viable, Eschar, Subcutaneous ?debrided: ?Level: Skin/Subcutaneous Tissue ?Debridement Description: Excisional ?Instrument: Curette ?Bleeding: Minimum ?Hemostasis Achieved: Pressure ?Response to Treatment: Procedure was tolerated well ?Level of Consciousness (Post- ?Awake and Alert ?procedure): ?Post Debridement Measurements of Total Wound ?Length: (cm) 1 ?Stage: Category/Stage II ?Width: (cm) 1 ?Depth: (cm) 0.1 ?Volume: (cm?) 0.079 ?Character of Wound/Ulcer Post Debridement: Improved ?Post Procedure Diagnosis ?Same as Pre-procedure ?Electronic Signature(s) ?Signed: 06/13/2021 4:18:52 PM By: Linton Ham MD ?Signed: 06/13/2021 4:31:34 PM By: Donnamarie Poag ?Entered By: Linton Ham on 06/13/2021 60:10:93 ?Rossin, [name] ([id number]) ?-------------------------------------------------------------------------------- ?HPI Details ?Patient Name: [name] [name], [name] [name] ?Date of Service: 06/13/2021 1:00 PM ?Medical Record Number: [id number] ?Patient Account Number: [id number] ?Date of Birth/Sex: 25-Jul-1926 (88 y.o. F) ?Treating RN: Donnamarie Poag ?Primary Care Provider: [name] Other Clinician: ?Referring Provider: [name] ?Treating Provider/Extender: Ricard Dillon ?Weeks in Treatment: 50 ?History of Present Illness ?HPI Description: 88 year old patient was recently seen by the PCPs office for significant pain right great toe which has been going on since July. ?She was initially treated with Keflex  which she did not complete and after the office visit this time she has been put on doxycycline. at the time of ?her visit she was found to have a ulcer on the plantar surface of the right great toe and also had a pyogenic granuloma over this area. ?X-ray of the right foot done 12/29/2014 -- IMPRESSION:Soft-tissue swelling and ulceration right great toe. No underlying ?bony lytic lesion identified. If osteomyelitis remains of clinical concern MRI can be obtained. ?Past medical history significant for anemia, chronic kidney disease stage III, obesity, varicose veins, coronary artery  disease, gout, history of ?nicotine addiction given up smoking in 2002, hypertension, status post cardiac catheterization, status post abdominal hysterectomy, ?cholecystectomy and tonsillectomy. ?hemoglobin A1c done in August was 5.8 ?01/22/2015 -- at this stage the [facility] Walking boat was going to cost them significant amount of money and they want to defer using that at the ?present time. ?01/29/2015 -- she had a podiatry appointment and they have trimmed her toenails. She has not heard back from the vascular office regarding her ?venous duplex study and I have asked them to call personally so that they can get the appointment soon. ?02/06/2015 -- they have made contact with the vascular office and from what I understand that test has been done but the report is pending. ?02/19/2014 -- the vascular test is scheduled for tomorrow ?Readmission: ?06/26/2020 upon evaluation today patient appears for initial evaluation in her clinic that she has been here before in 2016 and has been quite ?sometime. She did have a fractured hip in February on the 23rd 2022. Subsequently this had to be pinned and she ended up with a pressure ?injury on her heel following when she was using her foot to help move her around in the bed. Subsequently this has led to the wound that she has ?been dealing with since that time. She lives at home with her daughter  currently she does have dementia. 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 ?07/03/2020 upon evaluation today patient appears to be doing well with regard to her heel ulcer. She did have arterial studies they appear to be ?doing excellent it was premature normal across the board with TBI's in the 90s and ABIs well within normal range. Nonetheless there does not ?appear to be any signs of arterial insufficiency whatsoever. With that being said the patient does have also signs of improvement there is some ?necrotic tissue in the base of the wound number to try to clear some of that away today I do believe the Iodoflex/Iodosorb is doing well. ?5/24; difficult punched out wound on the left medial heel. She has been using Iodoflex ?07/17/2020 upon evaluation today patient appears to be doing well at this point in regard to her wound. I do feel like this is a little bit deeper but ?again that is what is expected as we continue with the Iodoflex I think that this is just going to get deeper until we get to the base of the wound. ?With that being said I think we are getting much closer to the base of the wound where we can have healthier tissue that we get a be managing ?here which that will be awesome. In the meantime I am not surprised by what I am seeing and in fact the wound appears to be better as ?compared to previous findings. ?07/24/2020 upon evaluation today patient appears to be doing well with regard to her wound. Overall I am extremely pleased with where things ?stand today. I do not see any signs of active infection which is great and overall I think that the patient is making good progress. There is good to ?be some need for sharp debridement today. ?07/31/2020 upon evaluation today patient appears to be doing better in regard to her heel ulcer. She has been tolerating the dressing  changes ?without complication. Fortunately there does not appear to be any signs of active infection which is great and overall very pleased  in this regard. ?No fevers, chills, nausea, vomiting, or diarrhea. ?6/28/

## 2021-06-15 IMAGING — CT CT ABD-PELV W/ CM
2 of 5 series · 16 of 46 positions shown, 18 images · IV contrast (APPLIED)
Comparison: None.

CLINICAL DATA: Firm bulge in the abdomen.

EXAM:
CT ABDOMEN AND PELVIS WITH CONTRAST
TECHNIQUE: Multidetector CT imaging of the abdomen and pelvis was performed
using the standard protocol following bolus administration of
intravenous contrast.
CONTRAST:  100mL OMNIPAQUE IOHEXOL 300 MG/ML  SOLN

[Series 2: routine abd/pel with · axial · 0.79mm/px · z∈[-1017,-642]mm · 13 of 86 slices shown, 15 images]
[im 6/86  soft-tissue]
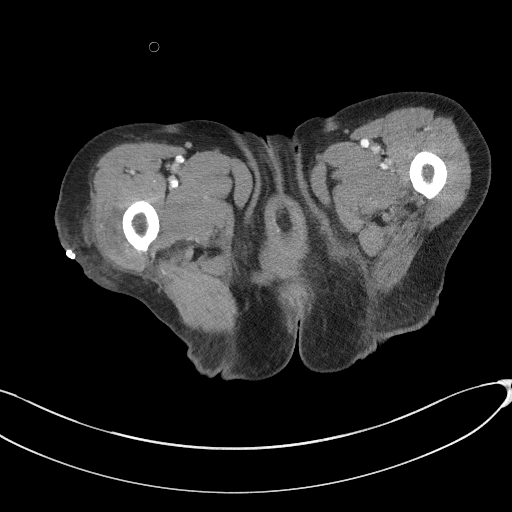
[im 6/86  bone]
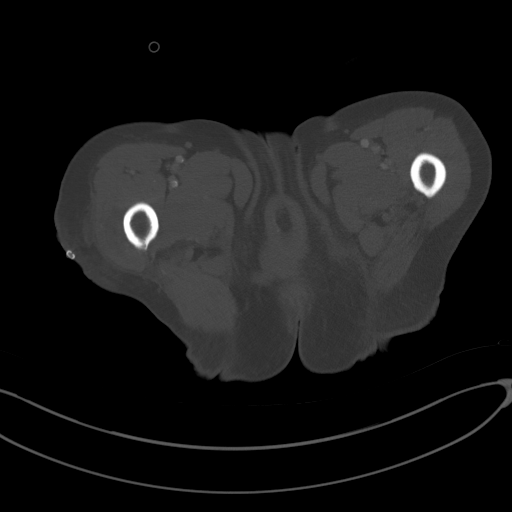
[im 11/86  soft-tissue]
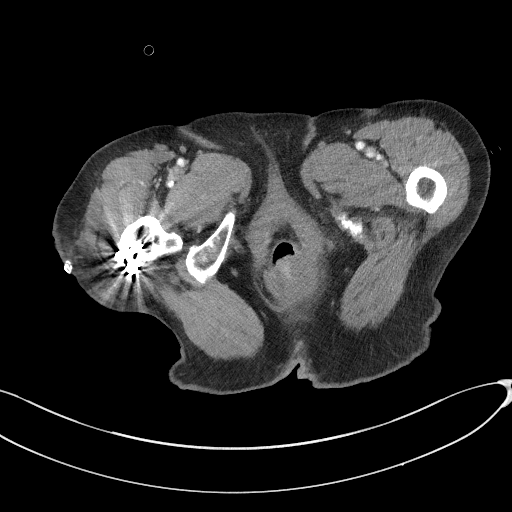
[im 21/86  soft-tissue]
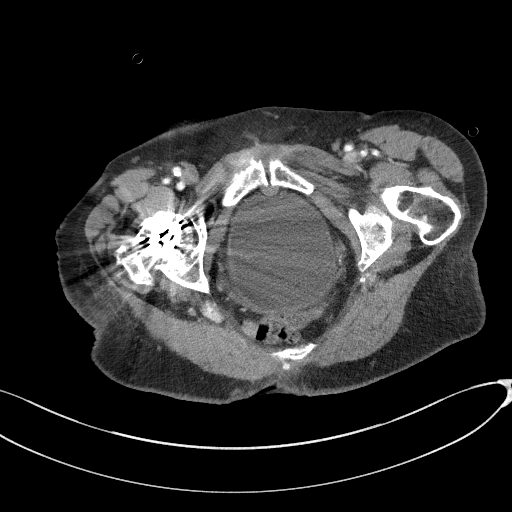
[im 26/86  soft-tissue]
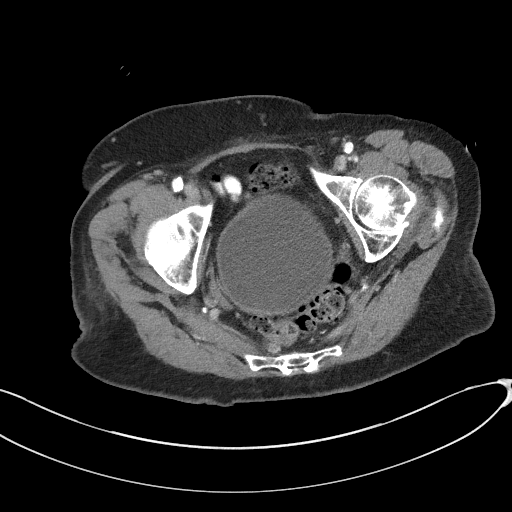
[im 31/86  soft-tissue]
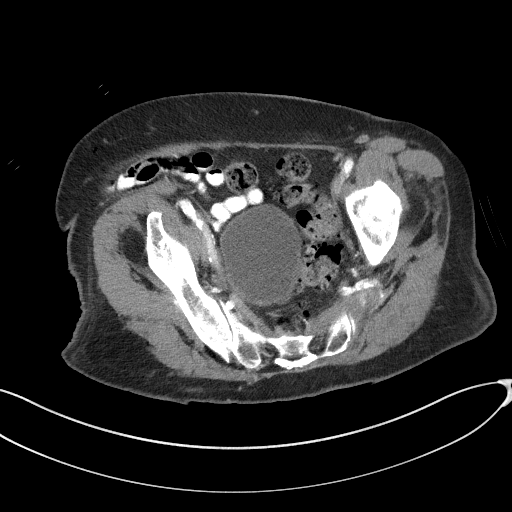
[im 36/86  soft-tissue]
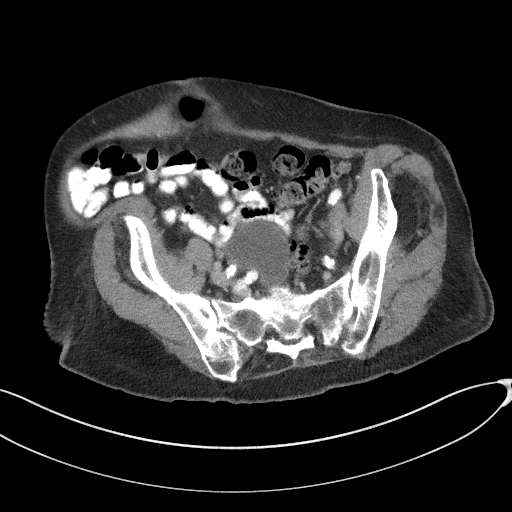
[im 46/86  soft-tissue]
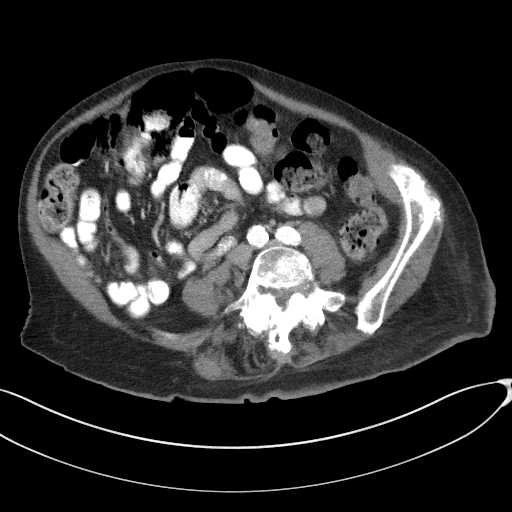
[im 51/86  soft-tissue]
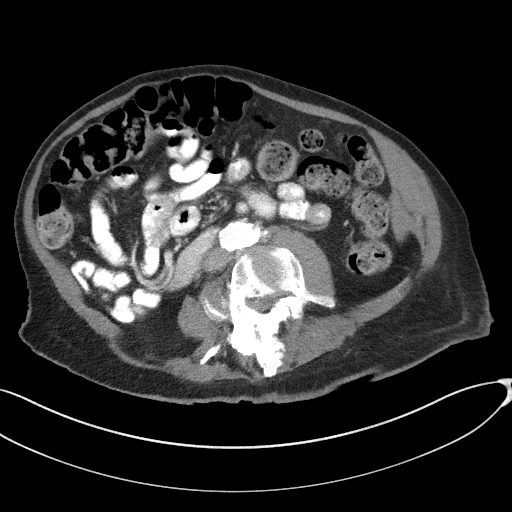
[im 56/86  soft-tissue]
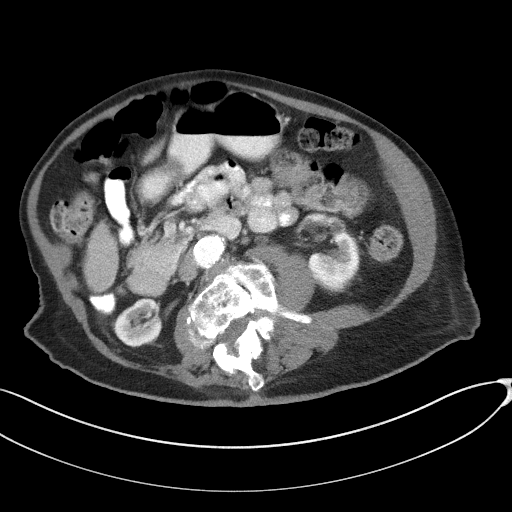
[im 56/86  bone]
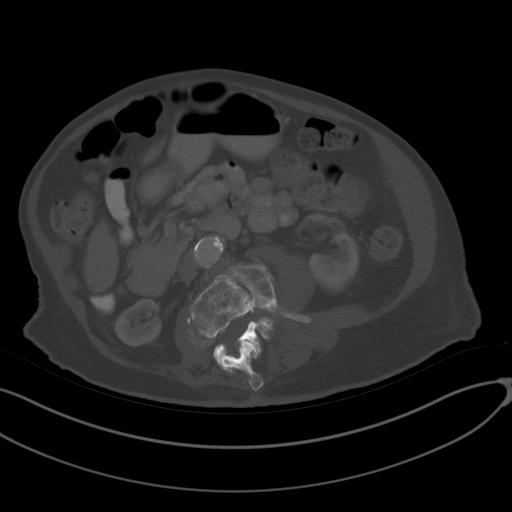
[im 61/86  soft-tissue]
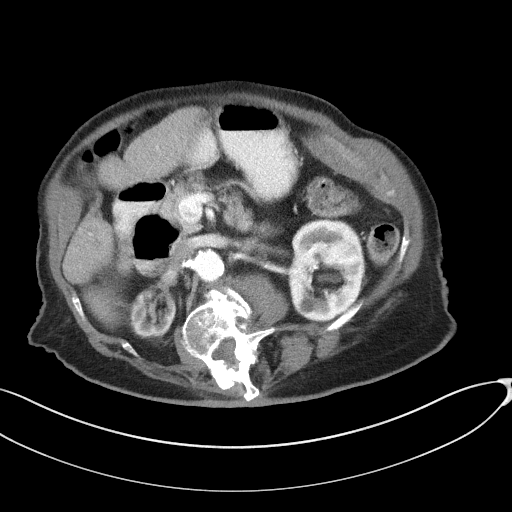
[im 66/86  soft-tissue]
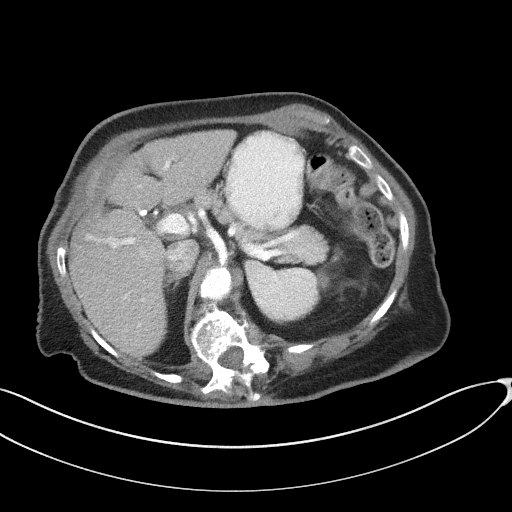
[im 76/86  soft-tissue]
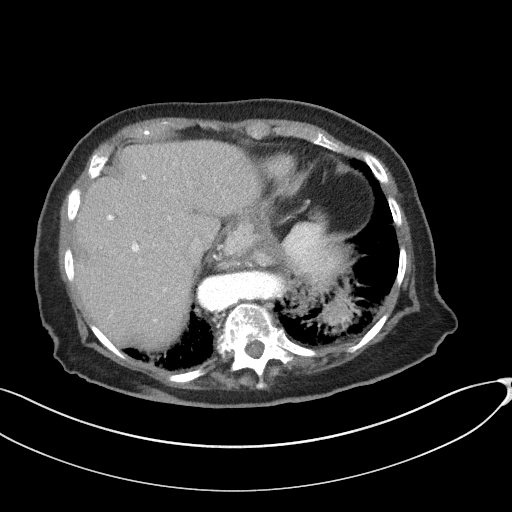
[im 81/86  soft-tissue]
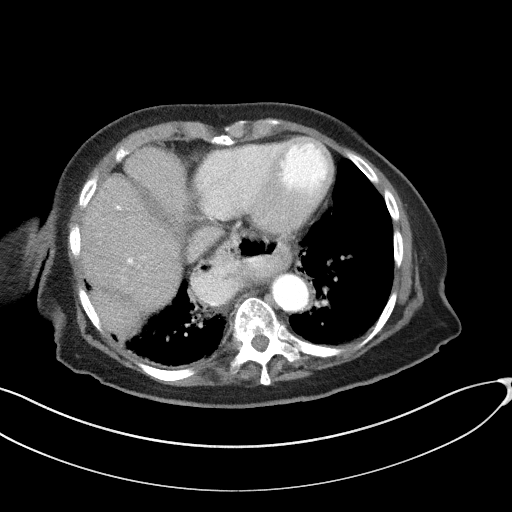

[Series 5: coronal st · coronal · 0.74mm/px · 3 of 95 slices shown]
[im 32/95  soft-tissue]
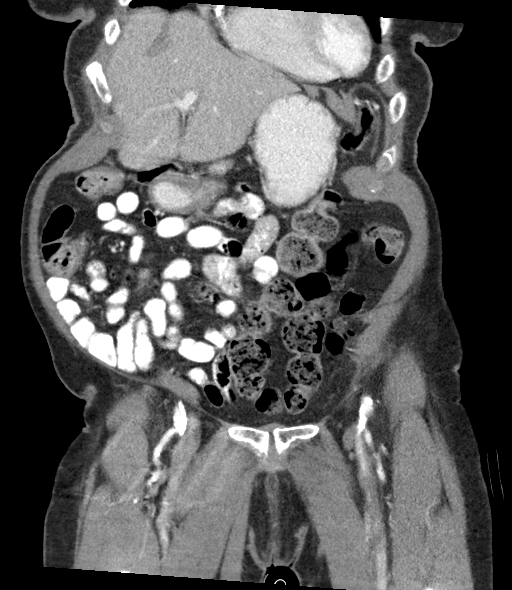
[im 42/95  soft-tissue]
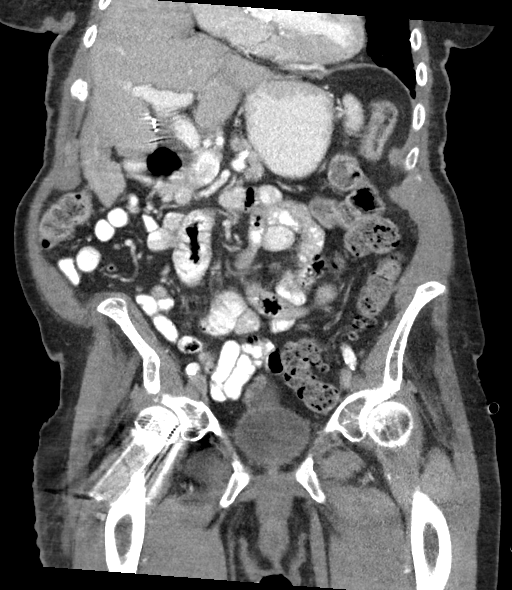
[im 53/95  soft-tissue]
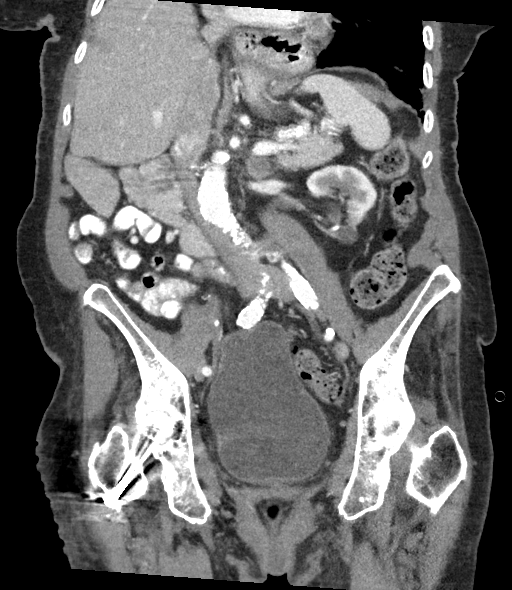

[16 of 46 positions shown; findings below may reference images not displayed]

FINDINGS: Lower chest: There is atelectasis versus scarring at the lung
bases.The heart is enlarged.

Hepatobiliary: The liver is normal. Status post
cholecystectomy.There is no biliary ductal dilation.

Pancreas: Normal contours without ductal dilatation. No
peripancreatic fluid collection.

Spleen: Unremarkable.

Adrenals/Urinary Tract:

--Adrenal glands: There is a benign appearing left adrenal adenoma.

--Right kidney/ureter: The right kidney is atrophic.

--Left kidney/ureter: There is atrophy of the lower pole left kidney
with multiple nonobstructing stones measuring up to approximately 6
mm.

--Urinary bladder: The urinary bladder is distended.

Stomach/Bowel:

--Stomach/Duodenum: There is a large hiatal hernia.

--Small bowel: Unremarkable.

--Colon: Unremarkable.

--Appendix: Normal.

Vascular/Lymphatic: Atherosclerotic calcification is present within
the non-aneurysmal abdominal aorta, without hemodynamically
significant stenosis.

--No retroperitoneal lymphadenopathy.

--No mesenteric lymphadenopathy.

--No pelvic or inguinal lymphadenopathy.

Reproductive: Status post hysterectomy. No adnexal mass.

Other: No ascites or free air. There is pelvic floor laxity with
evidence for a rectocele.

Musculoskeletal. No acute displaced fractures.
IMPRESSION: 1. Large hiatal hernia.
2. Pelvic floor laxity with evidence for a rectocele.

Aortic Atherosclerosis ([id number]).

## 2021-06-17 ENCOUNTER — Ambulatory Visit (INDEPENDENT_AMBULATORY_CARE_PROVIDER_SITE_OTHER): Payer: PPO

## 2021-06-17 VITALS — Wt 128.0 lb

## 2021-06-17 DIAGNOSIS — Z0000 Encounter for general adult medical examination without abnormal findings: Secondary | ICD-10-CM

## 2021-06-17 NOTE — Progress Notes (Signed)
Virtual Visit via Telephone Note  I connected with  [name] [name] on 06/17/21 at 11:30 AM EDT by telephone and verified that I am speaking with the correct person using two identifiers.  Location: Patient: home Provider: [facility] Persons participating in the virtual visit: patient/Nurse Health Advisor   I discussed the limitations, risks, security and privacy concerns of performing an evaluation and management service by telephone and the availability of in person appointments. The patient expressed understanding and agreed to proceed.  Interactive audio and video telecommunications were attempted between this nurse and patient, however failed, due to patient having technical difficulties OR patient did not have access to video capability.  We continued and completed visit with audio only.  Some vital signs may be absent or patient reported.   Hal Hope, LPN  Subjective:   [name] [name] is a 88 y.o. female who presents for Medicare Annual (Subsequent) preventive examination.  Review of Systems           Objective:    There were no vitals filed for this visit. There is no height or weight on file to calculate BMI.     04/01/2020    1:00 PM 03/27/2020    5:00 PM 03/27/2020    4:31 PM 03/27/2020    9:15 AM 08/09/2019    9:48 AM 08/05/2016   10:57 AM 10/09/2014    8:16 AM  Advanced Directives  Does Patient Have a Medical Advance Directive?   Yes Yes Yes Yes Yes  Type of Estate agent of [employer] Power of [place];Living will Healthcare Power of [place];Living will Living will;Healthcare Power of [employer] Power of [place];Living will Healthcare Power of [place];Living will Healthcare Power of [place];Living will  Does patient want to make changes to medical advance directive?       No - Patient declined  Copy of Healthcare Power of Attorney in Chart?  No - copy requested   No - copy requested No - copy requested No - copy requested     Current Medications (verified) Outpatient Encounter Medications as of 06/17/2021  Medication Sig   allopurinol (ZYLOPRIM) 100 MG tablet Take 2 tablets (200 mg total) by mouth daily.   amLODipine (NORVASC) 10 MG tablet amlodipine 10 mg tablet   amLODipine (NORVASC) 2.5 MG tablet TAKE ONE TABLET BY MOUTH EVERY EVENING   amoxicillin (AMOXIL) 500 MG capsule amoxicillin 500 mg capsule   amoxicillin-clavulanate (AUGMENTIN) 875-125 MG tablet amoxicillin 875 mg-potassium clavulanate 125 mg tablet   Calcium Carb-Cholecalciferol (CALCIUM 600 + D PO) Take 1 tablet by mouth daily.   cephALEXin (KEFLEX) 250 MG capsule cephalexin 250 mg capsule   clarithromycin (BIAXIN) 500 MG tablet clarithromycin 500 mg tablet   docusate sodium (COLACE) 100 MG capsule Take 1 capsule (100 mg total) by mouth 2 (two) times daily. Hold if having loose or frequent BM's.   ELIQUIS 5 MG TABS tablet TAKE ONE TABLET BY MOUTH TWICE DAILY (Patient not taking: Reported on 05/29/2021)   feeding supplement (ENSURE ENLIVE / ENSURE PLUS) LIQD Take 237 mLs by mouth 2 (two) times daily between meals.   ferrous sulfate 325 (65 FE) MG tablet Take 325 mg by mouth daily with breakfast.   gentamicin cream (GARAMYCIN) 0.1 % gentamicin 0.1 % topical cream   gentamicin cream (GARAMYCIN) 0.1 % Apply topically 3 (three) times a week.   levothyroxine (SYNTHROID) 75 MCG tablet levothyroxine 75 mcg tablet   levothyroxine (SYNTHROID) 88 MCG tablet Take 1 tablet (88 mcg  total) by mouth daily before breakfast.   Multiple Vitamins-Minerals (PRESERVISION AREDS 2 PO) Take 1 tablet by mouth 2 (two) times daily.   nystatin (MYCOSTATIN/NYSTOP) powder SMARTSIG:Packet(s) Topical Twice Daily   omeprazole (PRILOSEC) 20 MG capsule TAKE 1 CAPSULE BY MOUTH DAILY   SENEXON-S 8.6-50 MG tablet Take 1 tablet by mouth daily.   simvastatin (ZOCOR) 20 MG tablet simvastatin 20 mg tablet   valsartan-hydrochlorothiazide (DIOVAN-HCT) 320-12.5 MG tablet TAKE ONE TABLET BY  MOUTH EVERY DAY   No facility-administered encounter medications on file as of 06/17/2021.    Allergies (verified) Atorvastatin, Lovastatin, and Simvastatin   History: Past Medical History:  Diagnosis Date   Hyperlipidemia    Hypertension    Myocardial infarction [facility])    Past Surgical History:  Procedure Laterality Date   ABDOMINAL HYSTERECTOMY  1995   abdominal-uterine prolapse, 1 ovary removed, bladder tact   CARDIAC CATHETERIZATION     CHOLECYSTECTOMY     HIP PINNING,CANNULATED Right 03/28/2020   Procedure: CANNULATED HIP PINNING;  Surgeon: Juanell Fairly, MD;  Location: [facility] ORS;  Service: Orthopedics;  Laterality: Right;   TONSILLECTOMY AND ADENOIDECTOMY     Family History  Problem Relation Age of Onset   Ovarian cancer Mother    Alcohol abuse Sister    Alzheimer's disease Sister    Social History   Socioeconomic History   Marital status: Widowed    Spouse name: Not on file   Number of children: 5   Years of education: Not on file   Highest education level: Some college, no degree  Occupational History   Occupation: Retired  Tobacco Use   Smoking status: Former    Types: Cigarettes    Quit date: 02/18/2000    Years since quitting: 21.3   Smokeless tobacco: Never  Vaping Use   Vaping Use: Never used  Substance and Sexual Activity   Alcohol use: No    Alcohol/week: 0.0 standard drinks   Drug use: No   Sexual activity: Never  Other Topics Concern   Not on file  Social History Narrative   Not on file   Social Determinants of Health   Financial Resource Strain: Not on file  Food Insecurity: Not on file  Transportation Needs: Not on file  Physical Activity: Not on file  Stress: Not on file  Social Connections: Not on file    Tobacco Counseling Counseling given: Not Answered   Clinical Intake:  Pre-visit preparation completed: Yes  Pain : No/denies pain     Nutritional Risks: None Diabetes: No  How often do you need to have someone help  you when you read instructions, pamphlets, or other written materials from your doctor or pharmacy?: 1 - Never  Diabetic?no  Interpreter Needed?: No  Information entered by :: Kennedy Bucker, LPN   Activities of Daily Living    06/15/2021    7:01 PM  In your present state of health, do you have any difficulty performing the following activities:  Hearing? 1  Vision? 1  Difficulty concentrating or making decisions? 1  Walking or climbing stairs? 1  Dressing or bathing? 1  Doing errands, shopping? 1  Preparing Food and eating ? Y  Using the Toilet? Y  In the past six months, have you accidently leaked urine? Y  Do you have problems with loss of bowel control? Y  Managing your Medications? Y  Managing your Finances? Y  Housekeeping or managing your Housekeeping? Y    Patient Care Team: Malva Limes, MD as  PCP - General (Family Medicine) Lockie Mola, MD as Referring Physician (Ophthalmology) Felecia Shelling, DPM as Consulting Physician (Podiatry)  Indicate any recent Medical Services you may have received from other than [facility] providers in the past year (date may be approximate).     Assessment:   This is a routine wellness examination for [name]  Hearing/Vision screen No results found.  Dietary issues and exercise activities discussed:     Goals Addressed   None    Depression Screen    05/28/2020   11:27 AM 05/28/2020   11:14 AM 01/10/2020   11:06 AM 08/09/2019    9:43 AM 06/13/2019    1:38 PM 04/05/2018    8:49 AM 07/28/2017    9:00 AM  PHQ 2/9 Scores  PHQ - 2 Score 0 0 0 0 0 0 0  PHQ- 9 Score 1 0 1        Fall Risk    06/15/2021    7:01 PM 02/19/2021   11:09 AM 05/28/2020   11:14 AM 01/10/2020   11:06 AM 08/09/2019    9:49 AM  Fall Risk   Falls in the past year? 1 1  1 1   Number falls in past yr: 1 1 1 1 1   Comment    3 falls   Injury with Fall? 0 1 1 0 0  Comment     only bruising  Risk for fall due to :  History of fall(s) History of  fall(s)  Impaired balance/gait;Impaired mobility;Impaired vision  Follow up  Falls prevention discussed Falls prevention discussed Falls evaluation completed Falls prevention discussed    FALL RISK PREVENTION PERTAINING TO THE HOME:  Any stairs in or around the home? No  If so, are there any without handrails? No  Home free of loose throw rugs in walkways, pet beds, electrical cords, etc? Yes  Adequate lighting in your home to reduce risk of falls? Yes   ASSISTIVE DEVICES UTILIZED TO PREVENT FALLS:  Life alert? No  Use of a cane, walker or w/c? No  Grab bars in the bathroom? Yes  Shower chair or bench in shower? Yes  Elevated toilet seat or a handicapped toilet? Yes   Cognitive Function:        08/05/2016   11:04 AM  6CIT Screen  What Year? 0 points  What month? 0 points  What time? 0 points  Count back from 20 0 points  Months in reverse 4 points  Repeat phrase 2 points  Total Score 6 points    Immunizations Immunization History  Administered Date(s) Administered   Fluad Quad(high Dose 65+) 12/03/2020   Influenza Split 01/22/2015   Influenza, High Dose Seasonal PF 12/21/2015, 12/23/2016, 11/30/2017, 11/25/2018, 11/25/2019   PFIZER(Purple Top)SARS-COV-2 Vaccination 03/14/2019, 04/04/2019, 11/25/2019   Pneumococcal Conjugate-13 04/10/2014   Pneumococcal Polysaccharide-23 12/28/2008   Tdap 04/29/2010   Zoster, Live 04/29/2010    TDAP status: Due, Education has been provided regarding the importance of this vaccine. Advised may receive this vaccine at local pharmacy or Health Dept. Aware to provide a copy of the vaccination record if obtained from local pharmacy or Health Dept. Verbalized acceptance and understanding.  Flu Vaccine status: Declined, Education has been provided regarding the importance of this vaccine but patient still declined. Advised may receive this vaccine at local pharmacy or Health Dept. Aware to provide a copy of the vaccination record if obtained  from local pharmacy or Health Dept. Verbalized acceptance and understanding.  Pneumococcal vaccine status: Up to  date  Covid-19 vaccine status: Completed vaccines  Qualifies for Shingles Vaccine? Yes   Zostavax completed Yes   Shingrix Completed?: No.    Education has been provided regarding the importance of this vaccine. Patient has been advised to call insurance company to determine out of pocket expense if they have not yet received this vaccine. Advised may also receive vaccine at local pharmacy or Health Dept. Verbalized acceptance and understanding.  Screening Tests Health Maintenance  Topic Date Due   Zoster Vaccines- Shingrix (1 of 2) Never done   DEXA SCAN  10/16/2018   COVID-19 Vaccine (4 - Booster for Pfizer series) 01/20/2020   TETANUS/TDAP  04/28/2020   INFLUENZA VACCINE  09/17/2021   Pneumonia Vaccine 13+ Years old  Completed   HPV VACCINES  Aged Out    Health Maintenance  Health Maintenance Due  Topic Date Due   Zoster Vaccines- Shingrix (1 of 2) Never done   DEXA SCAN  10/16/2018   COVID-19 Vaccine (4 - Booster for Pfizer series) 01/20/2020   TETANUS/TDAP  04/28/2020    Colorectal cancer screening: No longer required.   Mammogram status: No longer required due to age.    Lung Cancer Screening: (Low Dose CT Chest recommended if Age 55-80 years, 30 pack-year currently smoking OR have quit w/in 15years.) does not qualify.   Additional Screening:  Hepatitis C Screening: does not qualify; Completed no  Vision Screening: Recommended annual ophthalmology exams for early detection of glaucoma and other disorders of the eye. Is the patient up to date with their annual eye exam?  Yes  Who is the provider or what is the name of the office in which the patient attends annual eye exams? [facility] If pt is not established with a provider, would they like to be referred to a provider to establish care? No .   Dental Screening: Recommended annual dental  exams for proper oral hygiene  Community Resource Referral / Chronic Care Management: CRR required this visit?  No   CCM required this visit?  No      Plan:     I have personally reviewed and noted the following in the patient's chart:   Medical and social history Use of alcohol, tobacco or illicit drugs  Current medications and supplements including opioid prescriptions.  Functional ability and status Nutritional status Physical activity Advanced directives List of other physicians Hospitalizations, surgeries, and ER visits in previous 12 months Vitals Screenings to include cognitive, depression, and falls Referrals and appointments  In addition, I have reviewed and discussed with patient certain preventive protocols, quality metrics, and best practice recommendations. A written personalized care plan for preventive services as well as general preventive health recommendations were provided to patient.     Hal Hope, LPN   10/23/452   Nurse Notes: none

## 2021-06-17 NOTE — Patient Instructions (Signed)
[name] [name] , ?Thank you for taking time to come for your Medicare Wellness Visit. I appreciate your ongoing commitment to your health goals. Please review the following plan we discussed and let me know if I can assist you in the future.  ? ?Screening recommendations/referrals: ?Colonoscopy: aged out ?Mammogram: aged out ?Bone Density: aged out ?Recommended yearly ophthalmology/optometry visit for glaucoma screening and checkup ?Recommended yearly dental visit for hygiene and checkup ? ?Vaccinations: ?Influenza vaccine: n/d ?Pneumococcal vaccine: 04/10/14  ?Tdap vaccine: 04/29/10, due ?Shingles vaccine: Zostavax 04/29/10   ?Covid-19:03/14/19, 04/04/19, 11/25/19 ? ?Advanced directives: no ? ?Conditions/risks identified: none ? ?Next appointment: Follow up in one year for your annual wellness visit 06/19/22 @ 10:30am by phone ? ? ?Preventive Care 65 Years and Older, Female ?Preventive care refers to lifestyle choices and visits with your health care provider that can promote health and wellness. ?What does preventive care include? ?A yearly physical exam. This is also called an annual well check. ?Dental exams once or twice a year. ?Routine eye exams. Ask your health care provider how often you should have your eyes checked. ?Personal lifestyle choices, including: ?Daily care of your teeth and gums. ?Regular physical activity. ?Eating a healthy diet. ?Avoiding tobacco and drug use. ?Limiting alcohol use. ?Practicing safe sex. ?Taking low-dose aspirin every day. ?Taking vitamin and mineral supplements as recommended by your health care provider. ?What happens during an annual well check? ?The services and screenings done by your health care provider during your annual well check will depend on your age, overall health, lifestyle risk factors, and family history of disease. ?Counseling  ?Your health care provider may ask you questions about your: ?Alcohol use. ?Tobacco use. ?Drug use. ?Emotional well-being. ?Home and  relationship well-being. ?Sexual activity. ?Eating habits. ?History of falls. ?Memory and ability to understand (cognition). ?Work and work Statistician. ?Reproductive health. ?Screening  ?You may have the following tests or measurements: ?Height, weight, and BMI. ?Blood pressure. ?Lipid and cholesterol levels. These may be checked every 5 years, or more frequently if you are over 52 years old. ?Skin check. ?Lung cancer screening. You may have this screening every year starting at age 61 if you have a 30-pack-year history of smoking and currently smoke or have quit within the past 15 years. ?Fecal occult blood test (FOBT) of the stool. You may have this test every year starting at age 38. ?Flexible sigmoidoscopy or colonoscopy. You may have a sigmoidoscopy every 5 years or a colonoscopy every 10 years starting at age 66. ?Hepatitis C blood test. ?Hepatitis B blood test. ?Sexually transmitted disease (STD) testing. ?Diabetes screening. This is done by checking your blood sugar (glucose) after you have not eaten for a while (fasting). You may have this done every 1-3 years. ?Bone density scan. This is done to screen for osteoporosis. You may have this done starting at age 86. ?Mammogram. This may be done every 1-2 years. Talk to your health care provider about how often you should have regular mammograms. ?Talk with your health care provider about your test results, treatment options, and if necessary, the need for more tests. ?Vaccines  ?Your health care provider may recommend certain vaccines, such as: ?Influenza vaccine. This is recommended every year. ?Tetanus, diphtheria, and acellular pertussis (Tdap, Td) vaccine. You may need a Td booster every 10 years. ?Zoster vaccine. You may need this after age 32. ?Pneumococcal 13-valent conjugate (PCV13) vaccine. One dose is recommended after age 25. ?Pneumococcal polysaccharide (PPSV23) vaccine. One dose is recommended after age 59. ?  Talk to your health care provider  about which screenings and vaccines you need and how often you need them. ?This information is not intended to replace advice given to you by your health care provider. Make sure you discuss any questions you have with your health care provider. ?Document Released: 03/02/2015 Document Revised: 10/24/2015 Document Reviewed: 12/05/2014 ?Elsevier Interactive Patient Education ? 2017 [place]. ? ?Fall Prevention in the Home ?Falls can cause injuries. They can happen to people of all ages. There are many things you can do to make your home safe and to help prevent falls. ?What can I do on the outside of my home? ?Regularly fix the edges of walkways and driveways and fix any cracks. ?Remove anything that might make you trip as you walk through a door, such as a raised step or threshold. ?Trim any bushes or trees on the path to your home. ?Use bright outdoor lighting. ?Clear any walking paths of anything that might make someone trip, such as rocks or tools. ?Regularly check to see if handrails are loose or broken. Make sure that both sides of any steps have handrails. ?Any raised decks and porches should have guardrails on the edges. ?Have any leaves, snow, or ice cleared regularly. ?Use sand or salt on walking paths during winter. ?Clean up any spills in your garage right away. This includes oil or grease spills. ?What can I do in the bathroom? ?Use night lights. ?Install grab bars by the toilet and in the tub and shower. Do not use towel bars as grab bars. ?Use non-skid mats or decals in the tub or shower. ?If you need to sit down in the shower, use a plastic, non-slip stool. ?Keep the floor dry. Clean up any water that spills on the floor as soon as it happens. ?Remove soap buildup in the tub or shower regularly. ?Attach bath mats securely with double-sided non-slip rug tape. ?Do not have throw rugs and other things on the floor that can make you trip. ?What can I do in the bedroom? ?Use night lights. ?Make sure  that you have a light by your bed that is easy to reach. ?Do not use any sheets or blankets that are too big for your bed. They should not hang down onto the floor. ?Have a firm chair that has side arms. You can use this for support while you get dressed. ?Do not have throw rugs and other things on the floor that can make you trip. ?What can I do in the kitchen? ?Clean up any spills right away. ?Avoid walking on wet floors. ?Keep items that you use a lot in easy-to-reach places. ?If you need to reach something above you, use a strong step stool that has a grab bar. ?Keep electrical cords out of the way. ?Do not use floor polish or wax that makes floors slippery. If you must use wax, use non-skid floor wax. ?Do not have throw rugs and other things on the floor that can make you trip. ?What can I do with my stairs? ?Do not leave any items on the stairs. ?Make sure that there are handrails on both sides of the stairs and use them. Fix handrails that are broken or loose. Make sure that handrails are as long as the stairways. ?Check any carpeting to make sure that it is firmly attached to the stairs. Fix any carpet that is loose or worn. ?Avoid having throw rugs at the top or bottom of the stairs. If you do have throw  rugs, attach them to the floor with carpet tape. ?Make sure that you have a light switch at the top of the stairs and the bottom of the stairs. If you do not have them, ask someone to add them for you. ?What else can I do to help prevent falls? ?Wear shoes that: ?Do not have high heels. ?Have rubber bottoms. ?Are comfortable and fit you well. ?Are closed at the toe. Do not wear sandals. ?If you use a stepladder: ?Make sure that it is fully opened. Do not climb a closed stepladder. ?Make sure that both sides of the stepladder are locked into place. ?Ask someone to hold it for you, if possible. ?Clearly mark and make sure that you can see: ?Any grab bars or handrails. ?First and last steps. ?Where the edge of  each step is. ?Use tools that help you move around (mobility aids) if they are needed. These include: ?Canes. ?Walkers. ?Scooters. ?Crutches. ?Turn on the lights when you go into a dark area. Replace any light bulbs a

## 2021-06-19 NOTE — Progress Notes (Signed)
Established patient visit    I,Roshena L Chambers,acting as a scribe for [name], MD.,have documented all relevant documentation on the behalf of [name], MD,as directed by  [name], MD while in the presence of [name], MD.    Patient: [name] [name]   DOB: 1926/03/04   88 y.o. Female  MRN: [id number] Visit Date: 06/21/2021  Today's healthcare provider: [name], MD   Chief Complaint  Patient presents with   Hypertension   Hypothyroidism   Subjective    HPI  Hypothyroid, follow-up  Lab Results  Component Value Date   TSH 4.240 09/10/2020   TSH 10.600 (H) 01/10/2020   TSH 8.960 (H) 06/13/2019   FREET4 1.17 09/10/2020   FREET4 0.95 01/10/2020    Wt Readings from Last 3 Encounters:  06/17/21 128 lb (58.1 kg)  02/19/21 128 lb (58.1 kg)  12/03/20 131 lb (59.4 kg)    She was last seen for hypothyroid 10 months ago.  Management since that visit includes continue same medication. She reports good compliance with treatment. She is not having side effects.   Symptoms: Yes change in energy level No constipation  No diarrhea No heat / cold intolerance  No nervousness No palpitations  No weight changes    -----------------------------------------------------------------------------------------   Hypertension, follow-up  BP Readings from Last 3 Encounters:  06/21/21 127/74  02/19/21 (!) 109/59  12/03/20 132/81   Wt Readings from Last 3 Encounters:  06/17/21 128 lb (58.1 kg)  02/19/21 128 lb (58.1 kg)  12/03/20 131 lb (59.4 kg)     She was last seen for hypertension 4 months ago.  BP at that visit was 109/59. Management since that visit includes continue same medication.  She reports poor compliance with treatment. Stopped taking blood pressure medications 1 month ago due to low home readings She is not having side effects.  She is following a Regular diet. She is not exercising. She does not smoke.  Use of agents  associated with hypertension: none.   Outside blood pressures are 91-130/42 73. Symptoms: No chest pain No chest pressure  No palpitations No syncope  No dyspnea No orthopnea  No paroxysmal nocturnal dyspnea No lower extremity edema   Pertinent labs Lab Results  Component Value Date   CHOL 173 07/01/2019   HDL 53 07/01/2019   LDLCALC 104 (H) 07/01/2019   TRIG 84 07/01/2019   CHOLHDL 3.3 07/01/2019   Lab Results  Component Value Date   NA 138 11/29/2020   K 4.0 11/29/2020   CREATININE 1.25 (H) 11/29/2020   GFRNONAA 40 (L) 11/29/2020   GLUCOSE 101 (H) 11/29/2020   TSH 4.240 09/10/2020     The ASCVD Risk score (Arnett DK, et al., 2019) failed to calculate for the following reasons:   The 2019 ASCVD risk score is only valid for ages 52 to 57  ---------------------------------------------------------------------------------------------------   Medications: Outpatient Medications Prior to Visit  Medication Sig   allopurinol (ZYLOPRIM) 100 MG tablet Take 2 tablets (200 mg total) by mouth daily.   Calcium Carb-Cholecalciferol (CALCIUM 600 + D PO) Take 1 tablet by mouth daily.   gentamicin cream (GARAMYCIN) 0.1 %    gentamicin cream (GARAMYCIN) 0.1 % Apply topically 3 (three) times a week.   levothyroxine (SYNTHROID) 88 MCG tablet Take 1 tablet (88 mcg total) by mouth daily before breakfast.   amLODipine (NORVASC) 10 MG tablet amlodipine 10 mg tablet (Patient not taking: Reported on 06/21/2021)   amLODipine (NORVASC) 2.5  MG tablet TAKE ONE TABLET BY MOUTH EVERY EVENING (Patient not taking: Reported on 06/21/2021)   docusate sodium (COLACE) 100 MG capsule Take 1 capsule (100 mg total) by mouth 2 (two) times daily. Hold if having loose or frequent BM's. (Patient not taking: Reported on 06/21/2021)   ELIQUIS 5 MG TABS tablet TAKE ONE TABLET BY MOUTH TWICE DAILY (Patient not taking: Reported on 06/21/2021)   feeding supplement (ENSURE ENLIVE / ENSURE PLUS) LIQD Take 237 mLs by mouth 2 (two)  times daily between meals. (Patient not taking: Reported on 06/21/2021)   ferrous sulfate 325 (65 FE) MG tablet Take 325 mg by mouth daily with breakfast. (Patient not taking: Reported on 06/21/2021)   Multiple Vitamins-Minerals (PRESERVISION AREDS 2 PO) Take 1 tablet by mouth 2 (two) times daily. (Patient not taking: Reported on 06/21/2021)   nystatin (MYCOSTATIN/NYSTOP) powder SMARTSIG:Packet(s) Topical Twice Daily (Patient not taking: Reported on 06/21/2021)   omeprazole (PRILOSEC) 20 MG capsule TAKE 1 CAPSULE BY MOUTH DAILY (Patient not taking: Reported on 06/21/2021)   SENEXON-S 8.6-50 MG tablet Take 1 tablet by mouth daily. (Patient not taking: Reported on 06/21/2021)   simvastatin (ZOCOR) 20 MG tablet simvastatin 20 mg tablet (Patient not taking: Reported on 06/21/2021)   valsartan-hydrochlorothiazide (DIOVAN-HCT) 320-12.5 MG tablet TAKE ONE TABLET BY MOUTH EVERY DAY (Patient not taking: Reported on 06/21/2021)   [DISCONTINUED] amoxicillin (AMOXIL) 500 MG capsule amoxicillin 500 mg capsule (Patient not taking: Reported on 06/21/2021)   [DISCONTINUED] amoxicillin-clavulanate (AUGMENTIN) 875-125 MG tablet amoxicillin 875 mg-potassium clavulanate 125 mg tablet (Patient not taking: Reported on 06/21/2021)   [DISCONTINUED] cephALEXin (KEFLEX) 250 MG capsule cephalexin 250 mg capsule (Patient not taking: Reported on 06/21/2021)   [DISCONTINUED] clarithromycin (BIAXIN) 500 MG tablet clarithromycin 500 mg tablet (Patient not taking: Reported on 06/21/2021)   [DISCONTINUED] levothyroxine (SYNTHROID) 75 MCG tablet levothyroxine 75 mcg tablet (Patient not taking: Reported on 06/21/2021)   No facility-administered medications prior to visit.    Review of Systems  Constitutional:  Positive for fatigue. Negative for appetite change, chills and fever.  Respiratory:  Negative for chest tightness and shortness of breath.   Cardiovascular:  Negative for chest pain and palpitations.  Gastrointestinal:  Negative for abdominal pain,  nausea and vomiting.  Neurological:  Negative for dizziness and weakness.      Objective    BP 127/74 (BP Location: Right Arm, Patient Position: Sitting, Cuff Size: Normal)   Pulse 88   Temp 98.7 F (37.1 C) (Oral)   Resp 14   SpO2 99% Comment: room air   Physical Exam   General: Appearance:     Well developed, well nourished female in no acute distress  Eyes:    PERRL, conjunctiva/corneas clear, EOM's intact       Lungs:     Clear to auscultation bilaterally, respirations unlabored  Heart:    Normal heart rate. Normal rhythm. No murmurs, rubs, or gallops.    MS:   All extremities are intact.    Neurologic:   Awake, alert, oriented x 3. No apparent focal neurological defect.         Assessment & Plan     1. Primary hypertension Off amlodipine and valsartan/hctz for nearly a month due to low blood pressures. Advised can take amlodipine 2.5 only as needed if home BP is unusually high.   2. CKD (chronic kidney disease) stage 4, GFR 15-29 ml/min (HCC)  - Comprehensive metabolic panel  3. Hypothyroidism, unspecified type  - T4, free - TSH  4.  Hypercholesterolemia without hypertriglyceridemia She is tolerating simvastatin well with no adverse effects.   - Lipid panel         The entirety of the information documented in the History of Present Illness, Review of Systems and Physical Exam were personally obtained by me. Portions of this information were initially documented by the CMA and reviewed by me for thoroughness and accuracy.     [name], MD  [facility] [phone] (phone) [phone] (fax)  [facility]

## 2021-06-21 ENCOUNTER — Ambulatory Visit (INDEPENDENT_AMBULATORY_CARE_PROVIDER_SITE_OTHER): Payer: PPO | Admitting: Family Medicine

## 2021-06-21 ENCOUNTER — Encounter: Payer: Self-pay | Admitting: Family Medicine

## 2021-06-21 VITALS — BP 127/74 | HR 88 | Temp 98.7°F | Resp 14

## 2021-06-21 DIAGNOSIS — E7800 Pure hypercholesterolemia, unspecified: Secondary | ICD-10-CM | POA: Diagnosis not present

## 2021-06-21 DIAGNOSIS — E039 Hypothyroidism, unspecified: Secondary | ICD-10-CM

## 2021-06-21 DIAGNOSIS — I10 Essential (primary) hypertension: Secondary | ICD-10-CM | POA: Diagnosis not present

## 2021-06-21 DIAGNOSIS — N184 Chronic kidney disease, stage 4 (severe): Secondary | ICD-10-CM

## 2021-06-21 MED ORDER — AMLODIPINE BESYLATE 2.5 MG PO TABS: 2.5000 mg | ORAL_TABLET | Freq: Every evening | ORAL | 1 refills | Status: DC

## 2021-06-24 ENCOUNTER — Encounter: Payer: PPO | Attending: Internal Medicine | Admitting: Internal Medicine

## 2021-06-24 DIAGNOSIS — I120 Hypertensive chronic kidney disease with stage 5 chronic kidney disease or end stage renal disease: Secondary | ICD-10-CM | POA: Diagnosis not present

## 2021-06-24 DIAGNOSIS — I2510 Atherosclerotic heart disease of native coronary artery without angina pectoris: Secondary | ICD-10-CM | POA: Diagnosis not present

## 2021-06-24 DIAGNOSIS — F0154 Vascular dementia, unspecified severity, with anxiety: Secondary | ICD-10-CM | POA: Insufficient documentation

## 2021-06-24 DIAGNOSIS — L89893 Pressure ulcer of other site, stage 3: Secondary | ICD-10-CM | POA: Insufficient documentation

## 2021-06-24 DIAGNOSIS — I10 Essential (primary) hypertension: Secondary | ICD-10-CM | POA: Insufficient documentation

## 2021-06-24 DIAGNOSIS — L89892 Pressure ulcer of other site, stage 2: Secondary | ICD-10-CM | POA: Diagnosis not present

## 2021-06-24 DIAGNOSIS — N186 End stage renal disease: Secondary | ICD-10-CM | POA: Diagnosis not present

## 2021-06-24 NOTE — Progress Notes (Signed)
Kaucher, Ritika L. ([id number]) ?Visit Report for 06/24/2021 ?Arrival Information Details ?Patient Name: [name] [name], [name] [name] ?Date of Service: 06/24/2021 1:45 PM ?Medical Record Number: [id number] ?Patient Account Number: [id number] ?Date of Birth/Sex: 12-Jan-1927 (88 y.o. F) ?Treating RN: Donnamarie Poag ?Primary Care Neesa Knapik: [name] Other Clinician: ?Referring Tion Tse: [name] ?Treating Deshun Sedivy/Extender: Ricard Dillon ?Weeks in Treatment: 51 ?Visit Information History Since Last Visit ?Added or deleted any medications: No ?Patient Arrived: Wheel Chair ?Had a fall or experienced change in No ?Arrival Time: 13:46 ?activities of daily living that may affect ?Accompanied By: daughter ?risk of falls: ?Transfer Assistance: Manual ?Hospitalized since last visit: No ?Patient Identification Verified: Yes ?Has Dressing in Place as Prescribed: Yes ?Secondary Verification Process Completed: Yes ?Pain Present Now: No ?Patient Requires Transmission-Based No ?Precautions: ?Patient Has Alerts: Yes ?Patient Alerts: NOT diabetic ?ABI R1.18 L1.25 ?06/27/20 ?Electronic Signature(s) ?Signed: 06/24/2021 4:56:25 PM By: Donnamarie Poag ?Entered ByDonnamarie Poag on 06/24/2021 13:47:13 ?Eyman, Dejae L. ([id number]) ?-------------------------------------------------------------------------------- ?Encounter Discharge Information Details ?Patient Name: [name] [name] ?Date of Service: 06/24/2021 1:45 PM ?Medical Record Number: [id number] ?Patient Account Number: [id number] ?Date of Birth/Sex: May 15, 1926 (88 y.o. F) ?Treating RN: Donnamarie Poag ?Primary Care Florance Paolillo: [name] Other Clinician: ?Referring Reeanna Acri: [name] ?Treating Jatorian Renault/Extender: Ricard Dillon ?Weeks in Treatment: 51 ?Encounter Discharge Information Items Post Procedure Vitals ?Discharge Condition: Stable ?Temperature (?F): 98.3 ?Ambulatory Status: Wheelchair ?Pulse (bpm): 94 ?Discharge Destination: Home ?Respiratory Rate (breaths/min): 16 ?Transportation:  Private Auto ?Blood Pressure (mmHg): 123/76 ?Accompanied By: daughter ?Schedule Follow-up Appointment: Yes ?Clinical Summary of Care: ?Electronic Signature(s) ?Signed: 06/24/2021 4:56:25 PM By: Donnamarie Poag ?Entered ByDonnamarie Poag on 06/24/2021 14:25:33 ?Biancardi, Kitt L. ([id number]) ?-------------------------------------------------------------------------------- ?Lower Extremity Assessment Details ?Patient Name: [name] [name] ?Date of Service: 06/24/2021 1:45 PM ?Medical Record Number: [id number] ?Patient Account Number: [id number] ?Date of Birth/Sex: April 20, 1926 (88 y.o. F) ?Treating RN: Donnamarie Poag ?Primary Care Holleigh Crihfield: [name] Other Clinician: ?Referring Laurel Smeltz: [name] ?Treating Edahi Kroening/Extender: Ricard Dillon ?Weeks in Treatment: 51 ?Edema Assessment ?Assessed: [Left: Yes] [Right: No] ?Edema: [Left: Ye] [Right: s] ?Calf ?Left: Right: ?Point of Measurement: 36 cm From Medial Instep 35 cm ?Ankle ?Left: Right: ?Point of Measurement: 9 cm From Medial Instep 24.5 cm ?Electronic Signature(s) ?Signed: 06/24/2021 4:56:25 PM By: Donnamarie Poag ?Entered ByDonnamarie Poag on 06/24/2021 13:57:43 ?Hornak, Kaydynce L. ([id number]) ?-------------------------------------------------------------------------------- ?Multi Wound Chart Details ?Patient Name: [name] [name] ?Date of Service: 06/24/2021 1:45 PM ?Medical Record Number: [id number] ?Patient Account Number: [id number] ?Date of Birth/Sex: 07/02/1926 (88 y.o. F) ?Treating RN: Donnamarie Poag ?Primary Care Indya Oliveria: [name] Other Clinician: ?Referring Kayn Haymore: [name] ?Treating Katja Blue/Extender: Ricard Dillon ?Weeks in Treatment: 51 ?Vital Signs ?Height(in): 58 ?Pulse(bpm): 94 ?Weight(lbs): 137 ?Blood Pressure(mmHg): 123/76 ?Body Mass Index(BMI): 28.6 ?Temperature(??F): 98.3 ?Respiratory Rate(breaths/min): 16 ?Photos: ?Wound Location: Left Foot Left Toe Great Left, Lateral Foot ?Wounding Event: Pressure Injury Pressure Injury Gradually  Appeared ?Primary Etiology: Pressure Ulcer Pressure Ulcer Pressure Ulcer ?Comorbid History: Cataracts, Anemia, Coronary Artery Cataracts, Anemia, Coronary Artery Cataracts, Anemia, Coronary Artery ?Disease, Hypertension, Myocardial Disease, Hypertension, Myocardial Disease, Hypertension, Myocardial ?Infarction, End Stage Renal Infarction, End Stage Renal Infarction, End Stage Renal ?Disease, Gout, Osteoarthritis, Disease, Gout, Osteoarthritis, Disease, Gout, Osteoarthritis, ?Dementia Dementia Dementia ?Date Acquired: 10/02/2020 06/03/2021 06/24/2021 ?Weeks of Treatment: 37 2 0 ?Wound Status: Open Open Open ?Wound Recurrence: No No No ?Measurements L x W x D (cm) 0.8x0.8x0.2 1x0.8x0.1 0.4x0.2x0.1 ?Area (cm?) : 0.503 0.628 0.063 ?Volume (cm?) : 0.101 0.063 0.006 ?% Reduction in Area: -60.20% 52.40% N/A ?%  Reduction in Volume: -225.80% 52.30% N/A ?Classification: Category/Stage III Category/Stage II Category/Stage II ?Exudate Amount: Medium Medium Medium ?Exudate Type: Serosanguineous Serous Serosanguineous ?Exudate Color: red, brown amber red, brown ?Wound Margin: Thickened Flat and Intact N/A ?Granulation Amount: Medium (34-66%) None Present (0%) Large (67-100%) ?Granulation Quality: N/A N/A Pink ?Necrotic Amount: Medium (34-66%) Large (67-100%) N/A ?Necrotic Tissue: Adherent Slough Eschar N/A ?Exposed Structures: ?Fat Layer (Subcutaneous Tissue): ?Fat Layer (Subcutaneous Tissue): N/A ?Yes Yes ?Fascia: No ?Fascia: No ?Tendon: No ?Tendon: No ?Muscle: No ?Muscle: No ?Joint: No ?Joint: No ?Bone: No ?Bone: No ?Epithelialization: Small (1-33%) None N/A ?Treatment Notes ?Electronic Signature(s) ?Signed: 06/24/2021 4:56:25 PM By: Donnamarie Poag ?Weisinger, Elexis L. ([id number]) ?Entered ByDonnamarie Poag on 06/24/2021 13:58:52 ?Edinger, Lorely L. ([id number]) ?-------------------------------------------------------------------------------- ?Multi-Disciplinary Care Plan Details ?Patient Name: [name] [name] ?Date of Service: 06/24/2021  1:45 PM ?Medical Record Number: [id number] ?Patient Account Number: [id number] ?Date of Birth/Sex: 1926/08/12 (88 y.o. F) ?Treating RN: Donnamarie Poag ?Primary Care Chananya Canizalez: [name] Other Clinician: ?Referring Ebonye Reade: [name] ?Treating Johnatan Baskette/Extender: Ricard Dillon ?Weeks in Treatment: 51 ?Active Inactive ?Electronic Signature(s) ?Signed: 06/24/2021 4:56:25 PM By: Donnamarie Poag ?Entered ByDonnamarie Poag on 06/24/2021 13:58:44 ?Wiltse, Nylene L. ([id number]) ?-------------------------------------------------------------------------------- ?Pain Assessment Details ?Patient Name: OCTIVIA, CANION ?Date of Service: 06/24/2021 1:45 PM ?Medical Record Number: [id number] ?Patient Account Number: [id number] ?Date of Birth/Sex: 28-Sep-1926 (88 y.o. F) ?Treating RN: Donnamarie Poag ?Primary Care Jamarien Rodkey: [name] Other Clinician: ?Referring Sarissa Dern: [name] ?Treating Jdyn Parkerson/Extender: Ricard Dillon ?Weeks in Treatment: 51 ?Active Problems ?Location of Pain Severity and Description of Pain ?Patient Has Paino No ?Site Locations ?Rate the pain. ?Current Pain Level: 0 ?Pain Management and Medication ?Current Pain Management: ?Notes ?no c/o pain unless wound is touched ?Electronic Signature(s) ?Signed: 06/24/2021 4:56:25 PM By: Donnamarie Poag ?Entered ByDonnamarie Poag on 06/24/2021 13:49:40 ?Spratlin, Jenica L. ([id number]) ?-------------------------------------------------------------------------------- ?Patient/Caregiver Education Details ?Patient Name: GAYTHA, RAYBOURN ?Date of Service: 06/24/2021 1:45 PM ?Medical Record Number: [id number] ?Patient Account Number: [id number] ?Date of Birth/Gender: Dec 09, 1926 (88 y.o. F) ?Treating RN: Donnamarie Poag ?Primary Care Physician: [name] Other Clinician: ?Referring Physician: [name] ?Treating Physician/Extender: Ricard Dillon ?Weeks in Treatment: 51 ?Education Assessment ?Education Provided To: ?Caregiver ?Education Topics Provided ?Basic  Hygiene: ?Pressure: ?Wound Debridement: ?Wound/Skin Impairment: ?Electronic Signature(s) ?Signed: 06/24/2021 4:56:25 PM By: Donnamarie Poag ?Entered ByDonnamarie Poag on 06/24/2021 14:13:50 ?Tarte, Ranee L. ([id number]) ?---------

## 2021-06-25 ENCOUNTER — Ambulatory Visit: Payer: PPO | Admitting: Podiatry

## 2021-06-25 ENCOUNTER — Encounter: Payer: Self-pay | Admitting: Podiatry

## 2021-06-25 DIAGNOSIS — B351 Tinea unguium: Secondary | ICD-10-CM

## 2021-06-25 DIAGNOSIS — M79676 Pain in unspecified toe(s): Secondary | ICD-10-CM

## 2021-06-25 NOTE — Progress Notes (Signed)
Bialecki, Luiza L. ([id number]) ?Visit Report for 06/24/2021 ?Debridement Details ?Patient Name: [name] [name], [name] [name] ?Date of Service: 06/24/2021 1:45 PM ?Medical Record Number: [id number] ?Patient Account Number: [id number] ?Date of Birth/Sex: 1926/05/10 (88 y.o. F) ?Treating RN: Donnamarie Poag ?Primary Care Provider: [name] Other Clinician: ?Referring Provider: [name] ?Treating Provider/Extender: Ricard Dillon ?Weeks in Treatment: 51 ?Debridement Performed for ?Wound #5 Left Toe Great ?Assessment: ?Performed By: Physician Ricard Dillon, MD ?Debridement Type: Debridement ?Level of Consciousness (Pre- ?Awake and Alert ?procedure): ?Pre-procedure Verification/Time Out ?Yes - 14:13 ?Taken: ?Start Time: 14:14 ?Pain Control: Lidocaine ?Total Area Debrided (L x W): 1 (cm) x 0.8 (cm) = 0.8 (cm?) ?Tissue and other material ?Viable, Non-Viable, Eschar, Subcutaneous ?debrided: ?Level: Skin/Subcutaneous Tissue ?Debridement Description: Excisional ?Instrument: Curette ?Bleeding: Minimum ?Hemostasis Achieved: Pressure ?Response to Treatment: Procedure was tolerated well ?Level of Consciousness (Post- ?Awake and Alert ?procedure): ?Post Debridement Measurements of Total Wound ?Length: (cm) 1 ?Stage: Category/Stage II ?Width: (cm) 0.8 ?Depth: (cm) 0.1 ?Volume: (cm?) 0.063 ?Character of Wound/Ulcer Post Debridement: Improved ?Post Procedure Diagnosis ?Same as Pre-procedure ?Electronic Signature(s) ?Signed: 06/24/2021 2:25:23 PM By: Linton Ham MD ?Signed: 06/24/2021 4:56:25 PM By: Donnamarie Poag ?Entered By: Linton Ham on 06/24/2021 14:25:23 ?[name] [name], [name] ([id number]) ?-------------------------------------------------------------------------------- ?HPI Details ?Patient Name: [name] [name], [name] [name] ?Date of Service: 06/24/2021 1:45 PM ?Medical Record Number: [id number] ?Patient Account Number: [id number] ?Date of Birth/Sex: 30-Nov-1926 (88 y.o. F) ?Treating RN: Donnamarie Poag ?Primary Care Provider: [name] Other  Clinician: ?Referring Provider: [name] ?Treating Provider/Extender: Ricard Dillon ?Weeks in Treatment: 51 ?History of Present Illness ?HPI Description: 88 year old patient was recently seen by the PCPs office for significant pain right great toe which has been going on since July. ?She was initially treated with Keflex which she did not complete and after the office visit this time she has been put on doxycycline. at the time of ?her visit she was found to have a ulcer on the plantar surface of the right great toe and also had a pyogenic granuloma over this area. ?X-ray of the right foot done 12/29/2014 -- IMPRESSION:Soft-tissue swelling and ulceration right great toe. No underlying ?bony lytic lesion identified. If osteomyelitis remains of clinical concern MRI can be obtained. ?Past medical history significant for anemia, chronic kidney disease stage III, obesity, varicose veins, coronary artery disease, gout, history of ?nicotine addiction given up smoking in 2002, hypertension, status post cardiac catheterization, status post abdominal hysterectomy, ?cholecystectomy and tonsillectomy. ?hemoglobin A1c done in August was 5.8 ?01/22/2015 -- at this stage the [facility] Walking boat was going to cost them significant amount of money and they want to defer using that at the ?present time. ?01/29/2015 -- she had a podiatry appointment and they have trimmed her toenails. She has not heard back from the vascular office regarding her ?venous duplex study and I have asked them to call personally so that they can get the appointment soon. ?02/06/2015 -- they have made contact with the vascular office and from what I understand that test has been done but the report is pending. ?02/19/2014 -- the vascular test is scheduled for tomorrow ?Readmission: ?06/26/2020 upon evaluation today patient appears for initial evaluation in her clinic that she has been here before in 2016 and has been quite ?sometime. She did have a  fractured hip in February on the 23rd 2022. Subsequently this had to be pinned and she ended up with a pressure ?injury on her heel following when she was using her foot to help move her around  in the bed. Subsequently this has led to the wound that she has ?been dealing with since that time. She lives at home with her daughter currently she does have dementia. 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 ?07/03/2020 upon evaluation today patient appears to be doing well with regard to her heel ulcer. She did have arterial studies they appear to be ?doing excellent it was premature normal across the board with TBI's in the 90s and ABIs well within normal range. Nonetheless there does not ?appear to be any signs of arterial insufficiency whatsoever. With that being said the patient does have also signs of improvement there is some ?necrotic tissue in the base of the wound number to try to clear some of that away today I do believe the Iodoflex/Iodosorb is doing well. ?5/24; difficult punched out wound on the left medial heel. She has been using Iodoflex ?07/17/2020 upon evaluation today patient appears to be doing well at this point in regard to her wound. I do feel like this is a little bit deeper but ?again that is what is expected as we continue with the Iodoflex I think that this is just going to get deeper until we get to the base of the wound. ?With that being said I think we are getting much closer to the base of the wound where we can have healthier tissue that we get a be managing ?here which that will be awesome. In the meantime I am not surprised by what I am seeing and in fact the wound appears to be better as ?compared to previous findings. ?07/24/2020 upon evaluation today patient appears to be doing well with regard to her wound. Overall I am extremely pleased with where  things ?stand today. I do not see any signs of active infection which is great and overall I think that the patient is making good progress. There is good to ?be some need for sharp debridement today. ?07/31/2020 upon evaluation today patient appears to be doing better in regard to her heel ulcer. She has been tolerating the dressing changes ?without complication. Fortunately there does not appear to be any signs of active infection which is great and overall very pleased in this regard. ?No fevers, chills, nausea, vomiting, or diarrhea. ?08/14/2020 upon evaluation today patient appears to be doing better in regard to her heel ulcer. I am very pleased in that regard. Unfortunately ?she still has a slight deep tissue injury in regard to the medial portion of her foot over the same area. Unfortunately I think this is something that ?if were not careful it was can open up into the wound. That is my main concern here based on what I see. Fortunately there does not appear to be ?any evidence of active infection which is great news and overall very pleased with where things stand at this point. ?08/21/2020 upon evaluation today patient appears to be doing well with regard to her wound. She has been tolerating the dressing changes without ?complication. Fortunately there does not appear to be any signs of active infection at this time. No fevers, chills, nausea, vomiting, or diarrhea. I ?do believe the compression wrap was beneficial for her. ?08/28/2020 upon evaluation today patient appears to be doing well with regard to her wounds currently. Fortunately there does not appear to be ?any signs of active infection at this time. No fevers, chills, nausea, vomiting,  or diarrhea. With that being said I think that her leg is doing quite ?well to be honest. ?09/04/2020 upon evaluation today patient appears to be doing well with regard to her wound on the heel. This is showing signs of good epithelial ?growth although there is  probably can it be an indention where this heals that is okay as long as we get it closed. Fortunately I do not see any ?evidence of infection at this point. ?09/13/2020 upon evaluation today patient appears to be doing w

## 2021-06-25 NOTE — Progress Notes (Signed)
? ?  SUBJECTIVE ?Patient presents to office today complaining of elongated, thickened nails that cause pain while ambulating in shoes.  Patient is unable to trim their own nails.  Patient does have chronic ulcers to the bilateral feet that is being managed at the wound care center weekly.  Dressings were left intact today.  Patient is here for routine foot care ? ?Past Medical History:  ?Diagnosis Date  ? Hyperlipidemia   ? Hypertension   ? Myocardial infarction [facility])   ? ? ?OBJECTIVE ?General Patient is awake, alert, and oriented x 3 and in no acute distress. ?Derm Skin is dry and supple bilateral. Negative open lesions or macerations. Remaining integument unremarkable. Nails are tender, long, thickened and dystrophic with subungual debris, consistent with onychomycosis, 1-5 bilateral. No signs of infection noted.  Dressings to the bilateral wounds were left intact today. ?Vasc  DP and PT pedal pulses palpable bilaterally. Temperature gradient within normal limits.  ?Neuro Epicritic and protective threshold sensation grossly intact bilaterally.  ?Musculoskeletal Exam No symptomatic pedal deformities noted bilateral. Muscular strength within normal limits. ? ?ASSESSMENT ?1.  Pain due to onychomycosis of toenails both ?2.  Chronic wounds bilateral feet being managed at the wound care center ? ?PLAN OF CARE ?1. Patient evaluated today.  ?2. Instructed to maintain good pedal hygiene and foot care.  ?3. Mechanical debridement of nails 1-5 bilaterally performed using a nail nipper. Filed with dremel without incident.  ?4. Return to clinic in 3 mos.  ? ? ?Edrick Kins, DPM ?[place] ? ?Dr. Edrick Kins, DPM  ?  ?[address]                                     ?[place], [place] [address]                ?Office [phone]  ?Fax [phone] ? ? ? ? ?

## 2021-06-26 DIAGNOSIS — N184 Chronic kidney disease, stage 4 (severe): Secondary | ICD-10-CM | POA: Diagnosis not present

## 2021-06-26 DIAGNOSIS — E039 Hypothyroidism, unspecified: Secondary | ICD-10-CM | POA: Diagnosis not present

## 2021-06-26 DIAGNOSIS — E7800 Pure hypercholesterolemia, unspecified: Secondary | ICD-10-CM | POA: Diagnosis not present

## 2021-06-27 ENCOUNTER — Encounter: Payer: Self-pay | Admitting: Family Medicine

## 2021-06-27 LAB — LIPID PANEL
Chol/HDL Ratio: 3.8 ratio (ref 0.0–4.4)
Cholesterol, Total: 162 mg/dL (ref 100–199)
HDL: 43 mg/dL (ref >39)
LDL Chol Calc (NIH): 100 mg/dL — ABNORMAL HIGH (ref 0–99)
Triglycerides: 105 mg/dL (ref 0–149)
VLDL Cholesterol Cal: 19 mg/dL (ref 5–40)

## 2021-06-27 LAB — T4, FREE: Free T4: 1.35 ng/dL (ref 0.82–1.77)

## 2021-06-27 LAB — COMPREHENSIVE METABOLIC PANEL
ALT: 6 IU/L (ref 0–32)
AST: 12 IU/L (ref 0–40)
Albumin/Globulin Ratio: 1 — ABNORMAL LOW (ref 1.2–2.2)
Albumin: 3.1 g/dL — ABNORMAL LOW (ref 3.5–4.6)
Alkaline Phosphatase: 94 IU/L (ref 44–121)
BUN/Creatinine Ratio: 18 (ref 12–28)
BUN: 21 mg/dL (ref 10–36)
Bilirubin Total: 0.3 mg/dL (ref 0.0–1.2)
CO2: 23 mmol/L (ref 20–29)
Calcium: 9 mg/dL (ref 8.7–10.3)
Chloride: 105 mmol/L (ref 96–106)
Creatinine, Ser: 1.15 mg/dL — ABNORMAL HIGH (ref 0.57–1.00)
Globulin, Total: 3.2 g/dL (ref 1.5–4.5)
Glucose: 98 mg/dL (ref 70–99)
Potassium: 4.1 mmol/L (ref 3.5–5.2)
Sodium: 141 mmol/L (ref 134–144)
Total Protein: 6.3 g/dL (ref 6.0–8.5)
eGFR: 45 mL/min/{1.73_m2} — ABNORMAL LOW (ref >59)

## 2021-06-27 LAB — TSH: TSH: 0.465 u[IU]/mL (ref 0.450–4.500)

## 2021-07-01 ENCOUNTER — Other Ambulatory Visit: Payer: PPO | Admitting: Student

## 2021-07-01 ENCOUNTER — Encounter: Payer: PPO | Admitting: Physician Assistant

## 2021-07-01 DIAGNOSIS — I10 Essential (primary) hypertension: Secondary | ICD-10-CM | POA: Diagnosis not present

## 2021-07-01 DIAGNOSIS — L89893 Pressure ulcer of other site, stage 3: Secondary | ICD-10-CM | POA: Diagnosis not present

## 2021-07-01 DIAGNOSIS — L97522 Non-pressure chronic ulcer of other part of left foot with fat layer exposed: Secondary | ICD-10-CM | POA: Diagnosis not present

## 2021-07-01 NOTE — Progress Notes (Addendum)
Nichol, Khushbu L. ([id number]) ?Visit Report for 07/01/2021 ?Chief Complaint Document Details ?Patient Name: [name] [name], [name] [name] ?Date of Service: 07/01/2021 2:30 PM ?Medical Record Number: [id number] ?Patient Account Number: [id number] ?Date of Birth/Sex: 05/25/1926 (88 y.o. F) ?Treating RN: Donnamarie Poag ?Primary Care Provider: [name] Other Clinician: ?Referring Provider: [name] ?Treating Provider/Extender: Jeri Cos ?Weeks in Treatment: 52 ?Information Obtained from: Patient ?Chief Complaint ?Left Heel Pressure Ulcer ?Electronic Signature(s) ?Signed: 07/01/2021 2:38:09 PM By: Worthy Keeler PA-C ?Entered By: Worthy Keeler on 07/01/2021 14:38:09 ?Schewe, Manali L. ([id number]) ?-------------------------------------------------------------------------------- ?HPI Details ?Patient Name: [name] [name], [name] [name] ?Date of Service: 07/01/2021 2:30 PM ?Medical Record Number: [id number] ?Patient Account Number: [id number] ?Date of Birth/Sex: May 14, 1926 (88 y.o. F) ?Treating RN: Donnamarie Poag ?Primary Care Provider: [name] Other Clinician: ?Referring Provider: [name] ?Treating Provider/Extender: Jeri Cos ?Weeks in Treatment: 52 ?History of Present Illness ?HPI Description: 88 year old patient was recently seen by the PCPs office for significant pain right great toe which has been going on since July. ?She was initially treated with Keflex which she did not complete and after the office visit this time she has been put on doxycycline. at the time of ?her visit she was found to have a ulcer on the plantar surface of the right great toe and also had a pyogenic granuloma over this area. ?X-ray of the right foot done 12/29/2014 -- IMPRESSION:Soft-tissue swelling and ulceration right great toe. No underlying ?bony lytic lesion identified. If osteomyelitis remains of clinical concern MRI can be obtained. ?Past medical history significant for anemia, chronic kidney disease stage III, obesity, varicose veins,  coronary artery disease, gout, history of ?nicotine addiction given up smoking in 2002, hypertension, status post cardiac catheterization, status post abdominal hysterectomy, ?cholecystectomy and tonsillectomy. ?hemoglobin A1c done in August was 5.8 ?01/22/2015 -- at this stage the [facility] Walking boat was going to cost them significant amount of money and they want to defer using that at the ?present time. ?01/29/2015 -- she had a podiatry appointment and they have trimmed her toenails. She has not heard back from the vascular office regarding her ?venous duplex study and I have asked them to call personally so that they can get the appointment soon. ?02/06/2015 -- they have made contact with the vascular office and from what I understand that test has been done but the report is pending. ?02/19/2014 -- the vascular test is scheduled for tomorrow ?Readmission: ?06/26/2020 upon evaluation today patient appears for initial evaluation in her clinic that she has been here before in 2016 and has been quite ?sometime. She did have a fractured hip in February on the 23rd 2022. Subsequently this had to be pinned and she ended up with a pressure ?injury on her heel following when she was using her foot to help move her around in the bed. Subsequently this has led to the wound that she has ?been dealing with since that time. She lives at home with her daughter currently she does have dementia. 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 ?07/03/2020 upon evaluation today patient appears to be doing well with regard to her heel ulcer. She did have arterial studies they appear to be ?doing excellent it was premature normal across the board with TBI's in the 90s and ABIs well within normal range. Nonetheless there does not ?appear to be any signs of arterial insufficiency whatsoever. With that being said  the patient does have also signs of improvement there is some ?necrotic tissue in the base of the wound number to try to clear some of that away today I do believe the Iodoflex/Iodosorb is doing well. ?5/24; difficult punched out wound on the left medial heel. She has been using Iodoflex ?07/17/2020 upon evaluation today patient appears to be doing well at this point in regard to her wound. I do feel like this is a little bit deeper but ?again that is what is expected as we continue with the Iodoflex I think that this is just going to get deeper until we get to the base of the wound. ?With that being said I think we are getting much closer to the base of the wound where we can have healthier tissue that we get a be managing ?here which that will be awesome. In the meantime I am not surprised by what I am seeing and in fact the wound appears to be better as ?compared to previous findings. ?07/24/2020 upon evaluation today patient appears to be doing well with regard to her wound. Overall I am extremely pleased with where things ?stand today. I do not see any signs of active infection which is great and overall I think that the patient is making good progress. There is good to ?be some need for sharp debridement today. ?07/31/2020 upon evaluation today patient appears to be doing better in regard to her heel ulcer. She has been tolerating the dressing changes ?without complication. Fortunately there does not appear to be any signs of active infection which is great and overall very pleased in this regard. ?No fevers, chills, nausea, vomiting, or diarrhea. ?08/14/2020 upon evaluation today patient appears to be doing better in regard to her heel ulcer. I am very pleased in that regard. Unfortunately ?she still has a slight deep tissue injury in regard to the medial portion of her foot over the same area. Unfortunately I think this is something that ?if were not careful it was can open up into the wound. That is my main  concern here based on what I see. Fortunately there does not appear to be ?any evidence of active infection which is great news and overall very pleased with where things stand at this point. ?08/21/2020 upon evaluation today patient appears to be doing well with regard to her wound. She has been tolerating the dressing changes without ?complication. Fortunately there does not appear to be any signs of active infection at this time. No fevers, chills, nausea, vomiting, or diarrhea. I ?do believe the compression wrap was beneficial for her. ?08/28/2020 upon evaluation today patient appears to be doing well with regard to her wounds currently. Fortunately there does not appear to be ?any signs of active infection at this time. No fevers, chills, nausea, vomiting, or diarrhea. With that being said I think that her leg is doing quite ?well to be honest. ?09/04/2020 upon evaluation today patient appears to be doing well with regard to her wound on the heel. This is showing signs of good epithelial ?growth although there is probably can it be an indention where this heals that is okay as long as we get it closed. Fortunately I do not see any ?evidence of infection at this point. ?09/13/2020 upon evaluation today patient appears to be doing well with regard to her wounds. She has been tolerating the dressing changes ?Reeves, Simrit L. ([id number]) ?without complication. Fortunately he is actually doing extremely well and I think she is making  great progress this is measuring smaller. I would ?recommend that such that we continue with the [facility] likely since he is doing so well. ?09/18/2020 upon evaluation today patient appears to be doing well with regard to her heel ulcer. She is making good progress and I am very ?pleased with what we see today. I think the Hydrofera Blue is still doing excellent. ?10/02/2020 upon evaluation today patient's wound actually appears to be showing signs of good improvement in regard to the  heel. I am very ?pleased in this regard. With that being said I do believe that the area which is somewhat been stable and dry is starting to lift up and beginning to ?clear this away as well that is on the

## 2021-07-01 NOTE — Progress Notes (Addendum)
Aylward, Lasonya L. ([id number]) ?Visit Report for 07/01/2021 ?Arrival Information Details ?Patient Name: [name] [name], [name] [name] ?Date of Service: 07/01/2021 2:30 PM ?Medical Record Number: [id number] ?Patient Account Number: [id number] ?Date of Birth/Sex: February 24, 1926 (88 y.o. F) ?Treating RN: Donnamarie Poag ?Primary Care Desmund Elman: [name] Other Clinician: ?Referring Fedra Lanter: [name] ?Treating Rhealyn Cullen/Extender: Jeri Cos ?Weeks in Treatment: 52 ?Visit Information History Since Last Visit ?Added or deleted any medications: No ?Patient Arrived: Wheel Chair ?Had a fall or experienced change in No ?Arrival Time: 14:55 ?activities of daily living that may affect ?Accompanied By: self ?risk of falls: ?Transfer Assistance: None ?Hospitalized since last visit: No ?Patient Identification Verified: Yes ?Has Dressing in Place as Prescribed: Yes ?Secondary Verification Process Completed: Yes ?Pain Present Now: No ?Patient Requires Transmission-Based No ?Precautions: ?Patient Has Alerts: Yes ?Patient Alerts: NOT diabetic ?ABI R1.18 L1.25 ?06/27/20 ?Electronic Signature(s) ?Signed: 07/01/2021 4:36:01 PM By: Donnamarie Poag ?Entered ByDonnamarie Poag on 07/01/2021 14:55:41 ?Senteno, Monseratt L. ([id number]) ?-------------------------------------------------------------------------------- ?Clinic Level of Care Assessment Details ?Patient Name: [name] [name], [name] [name] ?Date of Service: 07/01/2021 2:30 PM ?Medical Record Number: [id number] ?Patient Account Number: [id number] ?Date of Birth/Sex: 07/18/1926 (88 y.o. F) ?Treating RN: Donnamarie Poag ?Primary Care Lewis Keats: [name] Other Clinician: ?Referring Piper Hassebrock: [name] ?Treating Shaine Mount/Extender: Jeri Cos ?Weeks in Treatment: 52 ?Clinic Level of Care Assessment Items ?TOOL 4 Quantity Score ?[]  - Use when only an EandM is performed on FOLLOW-UP visit 0 ?ASSESSMENTS - Nursing Assessment / Reassessment ?[]  - Reassessment of Co-morbidities (includes updates in patient status) 0 ?[]  -  0 ?Reassessment of Adherence to Treatment Plan ?ASSESSMENTS - Wound and Skin Assessment / Reassessment ?[]  - Simple Wound Assessment / Reassessment - one wound 0 ?X- 3 5 ?Complex Wound Assessment / Reassessment - multiple wounds ?[]  - 0 ?Dermatologic / Skin Assessment (not related to wound area) ?ASSESSMENTS - Focused Assessment ?[]  - Circumferential Edema Measurements - multi extremities 0 ?[]  - 0 ?Nutritional Assessment / Counseling / Intervention ?[]  - 0 ?Lower Extremity Assessment (monofilament, tuning fork, pulses) ?[]  - 0 ?Peripheral Arterial Disease Assessment (using hand held doppler) ?ASSESSMENTS - Ostomy and/or Continence Assessment and Care ?[]  - Incontinence Assessment and Management 0 ?[]  - 0 ?Ostomy Care Assessment and Management (repouching, etc.) ?PROCESS - Coordination of Care ?X - Simple Patient / Family Education for ongoing care 1 15 ?[]  - 0 ?Complex (extensive) Patient / Family Education for ongoing care ?[]  - 0 ?Staff obtains Consents, Records, Test Results / Process Orders ?[]  - 0 ?Staff telephones HHA, Nursing Homes / Clarify orders / etc ?[]  - 0 ?Routine Transfer to another Facility (non-emergent condition) ?[]  - 0 ?Routine Hospital Admission (non-emergent condition) ?[]  - 0 ?New Admissions / Biomedical engineer / Ordering NPWT, Apligraf, etc. ?[]  - 0 ?Emergency Hospital Admission (emergent condition) ?X- 1 10 ?Simple Discharge Coordination ?[]  - 0 ?Complex (extensive) Discharge Coordination ?PROCESS - Special Needs ?[]  - Pediatric / Minor Patient Management 0 ?[]  - 0 ?Isolation Patient Management ?[]  - 0 ?Hearing / Language / Visual special needs ?[]  - 0 ?Assessment of Community assistance (transportation, D/C planning, etc.) ?[]  - 0 ?Additional assistance / Altered mentation ?[]  - 0 ?Support Surface(s) Assessment (bed, cushion, seat, etc.) ?INTERVENTIONS - Wound Cleansing / Measurement ?Kooyman, Tryniti L. ([id number]) ?[]  - 0 ?Simple Wound Cleansing - one wound ?X- 3 5 ?Complex Wound  Cleansing - multiple wounds ?X- 1 5 ?Wound Imaging (photographs - any number of wounds) ?[]  - 0 ?Wound Tracing (instead of photographs) ?[]  - 0 ?Simple Wound Measurement - one  wound ?X- 3 5 ?Complex Wound Measurement - multiple wounds ?INTERVENTIONS - Wound Dressings ?X - Small Wound Dressing one or multiple wounds 3 10 ?[]  - 0 ?Medium Wound Dressing one or multiple wounds ?[]  - 0 ?Large Wound Dressing one or multiple wounds ?X- 1 5 ?Application of Medications - topical ?[]  - 0 ?Application of Medications - injection ?INTERVENTIONS - Miscellaneous ?[]  - External ear exam 0 ?[]  - 0 ?Specimen Collection (cultures, biopsies, blood, body fluids, etc.) ?[]  - 0 ?Specimen(s) / Culture(s) sent or taken to Lab for analysis ?[]  - 0 ?Patient Transfer (multiple staff / Civil Service fast streamer / Similar devices) ?[]  - 0 ?Simple Staple / Suture removal (25 or less) ?[]  - 0 ?Complex Staple / Suture removal (26 or more) ?[]  - 0 ?Hypo / Hyperglycemic Management (close monitor of Blood Glucose) ?[]  - 0 ?Ankle / Brachial Index (ABI) - do not check if billed separately ?X- 1 5 ?Vital Signs ?Has the patient been seen at the hospital within the last three years: Yes ?Total Score: 115 ?Level Of Care: New/Established - Level ?3 ?Electronic Signature(s) ?Signed: 07/01/2021 4:36:01 PM By: Donnamarie Poag ?Entered ByDonnamarie Poag on 07/01/2021 15:20:04 ?Bauman, Tove L. ([id number]) ?-------------------------------------------------------------------------------- ?Encounter Discharge Information Details ?Patient Name: [name] [name], [name] [name] ?Date of Service: 07/01/2021 2:30 PM ?Medical Record Number: [id number] ?Patient Account Number: [id number] ?Date of Birth/Sex: 1926-08-28 (88 y.o. F) ?Treating RN: Donnamarie Poag ?Primary Care Charniece Venturino: [name] Other Clinician: ?Referring Lillianne Eick: [name] ?Treating Laquon Emel/Extender: Jeri Cos ?Weeks in Treatment: 52 ?Encounter Discharge Information Items ?Discharge Condition: Stable ?Ambulatory Status:  Wheelchair ?Discharge Destination: Home ?Transportation: Private Auto ?Accompanied By: daughter ?Schedule Follow-up Appointment: Yes ?Clinical Summary of Care: ?Electronic Signature(s) ?Signed: 07/01/2021 4:36:01 PM By: Donnamarie Poag ?Entered ByDonnamarie Poag on 07/01/2021 15:24:25 ?Harmsen, Bridey L. ([id number]) ?-------------------------------------------------------------------------------- ?Lower Extremity Assessment Details ?Patient Name: [name] [name], [name] [name] ?Date of Service: 07/01/2021 2:30 PM ?Medical Record Number: [id number] ?Patient Account Number: [id number] ?Date of Birth/Sex: [birth year] (88 y.o. F) ?Treating RN: Donnamarie Poag ?Primary Care Breanna Shorkey: [name] Other Clinician: ?Referring Furman Trentman: [name] ?Treating Kelsea Mousel/Extender: Jeri Cos ?Weeks in Treatment: 52 ?Edema Assessment ?Assessed: [Left: Yes] [Right: No] ?[Left: Edema] [Right: :] ?Calf ?Left: Right: ?Point of Measurement: 36 cm From Medial Instep ?Ankle ?Left: Right: ?Point of Measurement: 9 cm From Medial Instep 23 cm ?Electronic Signature(s) ?Signed: 07/01/2021 4:36:01 PM By: Donnamarie Poag ?Entered ByDonnamarie Poag on 07/01/2021 15:04:25 ?Galiano, Lilana L. ([id number]) ?-------------------------------------------------------------------------------- ?Multi Wound Chart Details ?Patient Name: [name] [name], [name] [name] ?Date of Service: 07/01/2021 2:30 PM ?Medical Record Number: [id number] ?Patient Account Number: [id number] ?Date of Birth/Sex: 1926/05/29 (88 y.o. F) ?Treating RN: Donnamarie Poag ?Primary Care Nadina Fomby: [name] Other Clinician: ?Referring Kieran Nachtigal: [name] ?Treating Freddie Nghiem/Extender: Jeri Cos ?Weeks in Treatment: 52 ?Vital Signs ?Height(in): 58 ?Pulse(bpm): 99 ?Weight(lbs): 137 ?Blood Pressure(mmHg): 120/65 ?Body Mass Index(BMI): 28.6 ?Temperature(??F): 98.4 ?Respiratory Rate(breaths/min): 16 ?Photos: ?Wound Location: Left, Midline Foot Left Toe Great Left, Lateral Foot ?Wounding Event: Pressure Injury Pressure Injury  Gradually Appeared ?Primary Etiology: Pressure Ulcer Pressure Ulcer Pressure Ulcer ?Comorbid History: Cataracts, Anemia, Coronary Artery Cataracts, Anemia, Coronary Artery Cataracts, Anemia, Coronary Artery ?Disease, H

## 2021-07-02 DIAGNOSIS — L89623 Pressure ulcer of left heel, stage 3: Secondary | ICD-10-CM | POA: Diagnosis not present

## 2021-07-09 ENCOUNTER — Ambulatory Visit: Payer: PPO | Admitting: Physician Assistant

## 2021-07-16 ENCOUNTER — Ambulatory Visit: Payer: PPO | Admitting: Physician Assistant

## 2021-07-22 ENCOUNTER — Encounter: Payer: PPO | Attending: Physician Assistant | Admitting: Physician Assistant

## 2021-07-22 DIAGNOSIS — E669 Obesity, unspecified: Secondary | ICD-10-CM | POA: Insufficient documentation

## 2021-07-22 DIAGNOSIS — F0150 Vascular dementia without behavioral disturbance: Secondary | ICD-10-CM | POA: Insufficient documentation

## 2021-07-22 DIAGNOSIS — Z87891 Personal history of nicotine dependence: Secondary | ICD-10-CM | POA: Insufficient documentation

## 2021-07-22 DIAGNOSIS — I2510 Atherosclerotic heart disease of native coronary artery without angina pectoris: Secondary | ICD-10-CM | POA: Diagnosis not present

## 2021-07-22 DIAGNOSIS — L97818 Non-pressure chronic ulcer of other part of right lower leg with other specified severity: Secondary | ICD-10-CM | POA: Diagnosis not present

## 2021-07-22 DIAGNOSIS — I120 Hypertensive chronic kidney disease with stage 5 chronic kidney disease or end stage renal disease: Secondary | ICD-10-CM | POA: Diagnosis not present

## 2021-07-22 DIAGNOSIS — N183 Chronic kidney disease, stage 3 unspecified: Secondary | ICD-10-CM | POA: Insufficient documentation

## 2021-07-22 DIAGNOSIS — I10 Essential (primary) hypertension: Secondary | ICD-10-CM | POA: Diagnosis not present

## 2021-07-22 DIAGNOSIS — Z6828 Body mass index (BMI) 28.0-28.9, adult: Secondary | ICD-10-CM | POA: Diagnosis not present

## 2021-07-22 DIAGNOSIS — L89893 Pressure ulcer of other site, stage 3: Secondary | ICD-10-CM | POA: Insufficient documentation

## 2021-07-22 NOTE — Progress Notes (Addendum)
Montalban, Mikaelah L. ([id number]) Visit Report for 07/22/2021 Chief Complaint Document Details Patient Name: [name] [name], [name] Date of Service: 07/22/2021 2:30 PM Medical Record Number: [id number] Patient Account Number: [id number] Date of Birth/Sex: [birth year] (88 y.o. F) Treating RN: Levora Dredge Primary Care Provider: [name] Other Clinician: Referring Provider: [name] Treating Provider/Extender: Skipper Cliche in Treatment: 73 Information Obtained from: Patient Chief Complaint Left Heel Pressure Ulcer Electronic Signature(s) Signed: 07/22/2021 2:53:23 PM By: Worthy Keeler PA-[name] [name] Entered By: Worthy Keeler on 07/22/2021 14:53:23 [name] [name], [name] ([id number]) -------------------------------------------------------------------------------- HPI Details Patient Name: Vanwinkle, Lahna L. Date of Service: 07/22/2021 2:30 PM Medical Record Number: [id number] Patient Account Number: [id number] Date of Birth/Sex: Mar 28, 1926 (88 y.o. F) Treating RN: Levora Dredge Primary Care Provider: [name] Other Clinician: Referring Provider: [name] Treating Provider/Extender: Skipper Cliche in Treatment: 1 History of Present Illness HPI Description: 88 year old patient was recently seen by the PCPs office for significant pain right great toe which has been going on since July. She was initially treated with Keflex which she did not complete and after the office visit this time she has been put on doxycycline. at the time of her visit she was found to have a ulcer on the plantar surface of the right great toe and also had a pyogenic granuloma over this area. X-ray of the right foot done 12/29/2014 -- IMPRESSION:Soft-tissue swelling and ulceration right great toe. No underlying bony lytic lesion identified. If osteomyelitis remains of clinical concern MRI can be obtained. Past medical history significant for anemia, chronic kidney disease stage III, obesity, varicose  veins, coronary artery disease, gout, history of nicotine addiction given up smoking in 2002, hypertension, status post cardiac catheterization, status post abdominal hysterectomy, cholecystectomy and tonsillectomy. hemoglobin A1c done in August was 5.8 01/22/2015 -- at this stage the [facility] Walking boat was going to cost them significant amount of money and they want to defer using that at the present time. 01/29/2015 -- she had a podiatry appointment and they have trimmed her toenails. She has not heard back from the vascular office regarding her venous duplex study and I have asked them to call personally so that they can get the appointment soon. 02/06/2015 -- they have made contact with the vascular office and from what I understand that test has been done but the report is pending. 02/19/2014 -- the vascular test is scheduled for tomorrow Readmission: 06/26/2020 upon evaluation today patient appears for initial evaluation in her clinic that she has been here before in 2016 and has been quite sometime. She did have a fractured hip in February on the 23rd 2022. Subsequently this had to be pinned and she ended up with a pressure injury on her heel following when she was using her foot to help move her around in the bed. Subsequently this has led to the wound that she has been dealing with since that time. She lives at home with her daughter currently she does have dementia. 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 07/03/2020 upon evaluation today patient appears to be doing well with regard to her heel ulcer. She did have arterial studies they appear to be doing excellent it was premature normal across the board with TBI's in the 90s and ABIs well within normal range. Nonetheless there does not appear to be any signs of arterial insufficiency whatsoever. With that  being said  the patient does have also signs of improvement there is some necrotic tissue in the base of the wound number to try to clear some of that away today I do believe the Iodoflex/Iodosorb is doing well. 5/24; difficult punched out wound on the left medial heel. She has been using Iodoflex 07/17/2020 upon evaluation today patient appears to be doing well at this point in regard to her wound. I do feel like this is a little bit deeper but again that is what is expected as we continue with the Iodoflex I think that this is just going to get deeper until we get to the base of the wound. With that being said I think we are getting much closer to the base of the wound where we can have healthier tissue that we get a be managing here which that will be awesome. In the meantime I am not surprised by what I am seeing and in fact the wound appears to be better as compared to previous findings. 07/24/2020 upon evaluation today patient appears to be doing well with regard to her wound. Overall I am extremely pleased with where things stand today. I do not see any signs of active infection which is great and overall I think that the patient is making good progress. There is good to be some need for sharp debridement today. 07/31/2020 upon evaluation today patient appears to be doing better in regard to her heel ulcer. She has been tolerating the dressing changes without complication. Fortunately there does not appear to be any signs of active infection which is great and overall very pleased in this regard. No fevers, chills, nausea, vomiting, or diarrhea. 08/14/2020 upon evaluation today patient appears to be doing better in regard to her heel ulcer. I am very pleased in that regard. Unfortunately she still has a slight deep tissue injury in regard to the medial portion of her foot over the same area. Unfortunately I think this is something that if were not careful it was can open up into the wound. That is  my main concern here based on what I see. Fortunately there does not appear to be any evidence of active infection which is great news and overall very pleased with where things stand at this point. 08/21/2020 upon evaluation today patient appears to be doing well with regard to her wound. She has been tolerating the dressing changes without complication. Fortunately there does not appear to be any signs of active infection at this time. No fevers, chills, nausea, vomiting, or diarrhea. I do believe the compression wrap was beneficial for her. 08/28/2020 upon evaluation today patient appears to be doing well with regard to her wounds currently. Fortunately there does not appear to be any signs of active infection at this time. No fevers, chills, nausea, vomiting, or diarrhea. With that being said I think that her leg is doing quite well to be honest. 09/04/2020 upon evaluation today patient appears to be doing well with regard to her wound on the heel. This is showing signs of good epithelial growth although there is probably can it be an indention where this heals that is okay as long as we get it closed. Fortunately I do not see any evidence of infection at this point. 09/13/2020 upon evaluation today patient appears to be doing well with regard to her wounds. She has been tolerating the dressing changes Chaikin, Bryson L. ([id number]) without complication. Fortunately he is actually doing extremely well and I think she is making  great progress this is measuring smaller. I would recommend that such that we continue with the [facility] likely since he is doing so well. 09/18/2020 upon evaluation today patient appears to be doing well with regard to her heel ulcer. She is making good progress and I am very pleased with what we see today. I think the Hydrofera Blue is still doing excellent. 10/02/2020 upon evaluation today patient's wound actually appears to be showing signs of good improvement in regard  to the heel. I am very pleased in this regard. With that being said I do believe that the area which is somewhat been stable and dry is starting to lift up and beginning to clear this away as well that is on the foot. Subsequently I am going to have to perform some debridement here and we did actually go ahead and allow this as a wound today as before it was just more deep tissue injury and eschar covering now I think it is a little bit more than that to be honest. 10/09/2020 upon evaluation today patient appears to be doing decently well in regard to her wounds. I am actually very pleased with where things stand today. There does not appear to be any signs of active infection which is great news. No fevers, chills, nausea, vomiting, or diarrhea. She is going require some sharp debridement today. 10/16/2020 upon evaluation today patient appears to be doing decently well in regard to her heel as well as the foot. Both are showing signs of good improvement which is great news. In general and extremely pleased with where things stand at this point. She is tolerating the [facility] well although this is getting so small in the heel I think collagen may be a better option here based on what I am seeing. With regard to the foot the Iodoflex/Iodosorb is still probably the best method here. 10/26/2020 upon evaluation today patient actually appears to be doing well in some respects today. She has been tolerating the dressing changes without complication and this is great news. The Hydrofera Blue is done well for the heel this actually appears to be healed today. With that being said in regard to the foot I think we are ready to switch to [facility] based on what I see at this point. 10/30/2020 upon evaluation today patient appears to be doing well with regard to his wound. He has been tolerating the dressing changes without complication. Fortunately there does not appear to be any signs of active infection  systemically at this time which is great news and overall very pleased with where things stand today. I do think that she is making progress although it somewhat slow still where showing signs of improvement little by little here. 11/06/2020 upon evaluation today patient appears to be doing decently well in regard to her wound. We will start and see better overall appearance of the base of the wound which is great news she still continues to have some abnormal fluorescence signals with a MolecuLight DX that will be detailed below. 11/20/2020 upon evaluation today patient's wound actually showing signs of improvement. There is good to be some need for sharp debridement today and that was discussed with the patient. I am going to go ahead and clean this up but I do believe the [facility] is doing a great job. 11/27/2020; the patient had eschar over the original heel wound which I removed with a #3 curette there was nothing open here. The area is on  the lateral left foot foot. 12/04/2020 upon evaluation today patient appears to be doing well with regard to her wound. The wound bed actually appeared to be doing well after I remove the dry endoform from the wound bed. This I tracked little bit of fluid but nonetheless underneath this appears to be doing awesome. I am very pleased with where things stand today. No fevers, chills, nausea, vomiting, or diarrhea. 12/11/2020 upon evaluation today patient appears to be doing well with regard to her wound although it keeps getting dry and filled up with the endoform I am not seeing any signs of infection but we are going to have to clean this away in order to try and get things moving in an appropriate direction. I discussed that with patient's daughter today as well. She is in agreement with proceeding as such. 12/21/2020 upon evaluation today patient appears to be doing well with regard to her wound this did not seal off like it was with the collagen  but nonetheless also did not really feeling quite as much as I would like to have seen. I do believe that we may need to go ahead and see about doing a debridement today and I really feel like we may want to switch back to the [facility] which I felt like was doing better in the past. Good news is the base of the wound appears healthy with good granulation tissue. 12/28/2020 upon evaluation patient's wound actually showed signs of doing quite well in regard to her foot. I think that we getting very close to complete resolution and overall I am extremely happy with where things stand today. 01/04/2021 upon evaluation today patient appears to be doing well with regard to her wound on the foot. This is good to require little bit of debridement today but overall seems to be doing quite well. I am actually very pleased with where we stand currently. 01/08/2021 upon evaluation today patient's wound actually showing signs of some improvement although she still has some callus buildup around the edges of the wound unfortunately I think this is continuing to rub despite what we are doing currently. I think that we may need to go ahead and see about switching up the dressing a little bit I Minna recommend collagen and then on top of the collagen we will get a use some foam double layered cut in a doughnut to try to take pressure off of this area we will see how this does over the next week. 01/15/2021 upon evaluation today patient's wound is actually showing signs of being completely dried over with the collagen. This is definitely not what we are looking for. I think that the wound dried out too quickly is the main issue that we are running into at this point and I really think we may need to do something else try to keep that from occurring. 01/22/2021 upon evaluation today patient's wound actually did stay open it did not callus over as its been doing in the past this is good news. Also feel like it may  not be quite as deep as what we have previously seen. There is still not as much improvement he does what I would like to see but nonetheless I think we are headed in the right direction. 01/29/2021 upon evaluation today patient appears to be doing okay in regard to her wound is not too dry but unfortunately is too wet the Xeroform just did not do what I was hoping that she made  things a little bit worse as far as size wise I am actually going to try at this point considering this is more open but not as deep the Hydrofera Blue to see if this will be beneficial at this time. Again we seem to be cycling through things and it is becoming a little difficult to get this to completely close but in the past Hydrofera Blue is what she did the best with 1 week to keep it from closing up prematurely. 02/05/2021 upon evaluation today patient's wound actually showing some signs of improvement which is great news. Fortunately I do not see any evidence of active infection locally nor systemically. It is definitely not too wet which is been the biggest issue we have seen up to this point over the past several weeks. [name] [name], [name] ([id number]) 02/21/2021 upon evaluation today patient appears to be doing excellent in regard to her wound. This actually appears much improved compared to last time I saw her which is great news as well. No fevers, chills, nausea, vomiting, or diarrhea. 02/28/2021 upon evaluation today patient's wound on her foot actually appears to be doing quite well. I am very pleased with where things stand today. I do not see any evidence of active infection locally nor systemically at this time. No fevers, chills, nausea, vomiting, or diarrhea. 03/14/2021 upon evaluation today patient appears to be doing well with regard to her wound. There is a little bit of a limp on the plantar edge of the wound opening which I Minna see about trying to clear away today. Fortunately I think other than this  everything seems to be showing signs of improvement measurements still are roughly the same. It is definitely not as deep however. 03/28/2021 upon evaluation today patient appears to be doing well with regard to her wound. There is still some slough noted in the base of the wound. Fortunately there does not appear to be any evidence of active infection locally nor systemically at this time which is good news. 04/11/2021 upon evaluation today patient appears to be doing decently well in regard to her foot ulcer. This is showing a little bit of moisture buildup but fortunately nothing too significant I think the wound is looking better. This is just very slow to heal. Fortunately I do not see any evidence of active infection at this time. 04/18/2021 upon evaluation today patient appears to be doing well with regard to her ulcer. In fact this is getting very close to completely resolved. I am actually extremely pleased with where things stand today and how this appears. 05/02/2021 upon evaluation patient appears to be doing really about the same in regard to her wound I do not see any signs of significant worsening which is great news and overall very pleased in that regard. With that being said I do feel like that the patient is continuing to have really stalling of the wound she also has a couple areas that appear to be deep tissue injuries that are somewhat unfortunate to be perfectly honest. With that being said I do see evidence here in general of improvement which is good with regard to the depth of the wound we just need to try to protect her legs and hopefully her feet while allowing this last little bit to heal. Patient's daughter is doing an excellent job taking care of her and I definitely do not see any issues in that regard at all she is very diligent in trying to keep pressure off  as far as the feet are concerned. Nonetheless I do believe the patient still despite all of this is still getting  several areas of deep tissue injury which raises the question as to why is this a blood flow issue or something else in the past she really has done well and had no significant blood flow limitations with her ABIs of May 2020 to be excellent and normal. We will see how things progress and if we need to repeat this we will definitely do so. 05/09/2021 upon evaluation today patient unfortunately appears to not be doing quite as well as what I previously saw. In fact her feet seem to be showing signs of breakdown at several locations as well as the wound. In general she has not been eating or drinking well I am kind of concerned that she may be shutting down to some degree. 05-23-2021 on evaluation today patient appears to be doing well all things considered with regard to her wound. She does have a little bit of erythema around the medial foot ulcer none of the other deep tissue injuries have open which is good news. Fortunately I see no evidence of active infection locally nor systemically at this time which is also great news. No fevers, chills, nausea, vomiting, or diarrhea. 06-06-2021 upon evaluation today patient appears to be doing better in regard to her foot ulcers. Everything is actually showing signs of improvement which is great news. Fortunately I do not see any signs of active infection locally nor systemically which is great news. In fact her overall coloring of her foot seems better and she is also not having as much pain as she was. Her blood pressure is also more stable and had been dropping very low obviously I think that that is probably a big part of why things are looking better. 4/27; using Hydrofera Blue and 2 wounds on the left foot on the tip of the left great toe and on the left medial foot 5/8; the patient however has 3 wounds on the left foot. 1 at the tip of the left great toe, 1 medially on the left foot and another on the lateral fifth metatarsal head. Using Hydrofera Blue in  all wounds. She does not complain of pain. 07-01-2021 upon evaluation today patient appears to be doing well with regard to her wounds everything is appearing to do well no signs of infection in general and very pleased with where we stand today. There does not appear to be any signs of active infection locally or systemically which is great news. No fevers, chills, nausea, vomiting, or diarrhea. 07-22-2021 upon evaluation today patient appears to be doing well in general in regard to her wound she does have a new wound on the right shin this is due to a trauma that is a small skin tear. This happened in the past week. With that being said overall she seems to be doing quite well I do not see any evidence of a infection which is great news. No fevers, chills, nausea, vomiting, or diarrhea. Electronic Signature(s) Signed: 07/22/2021 3:34:53 PM By: Worthy Keeler PA-[name] [name] Entered By: Worthy Keeler on 07/22/2021 15:34:53 [name] [name], [name] ([id number]) -------------------------------------------------------------------------------- Physical Exam Details Patient Name: Albritton, Lilliona L. Date of Service: 07/22/2021 2:30 PM Medical Record Number: [id number] Patient Account Number: [id number] Date of Birth/Sex: 04/18/1926 (88 y.o. F) Treating RN: Levora Dredge Primary Care Provider: [name] Other Clinician: Referring Provider: [name] Treating Provider/Extender: Skipper Cliche in Treatment: [phone]  Constitutional Well-nourished and well-hydrated in no acute distress. Respiratory normal breathing without difficulty. Psychiatric this patient is able to make decisions and demonstrates good insight into disease process. Alert and Oriented x 3. pleasant and cooperative. Notes Upon inspection patient's wound bed actually showed signs of good granulation epithelization at this point. Fortunately I do not see any evidence of worsening overall and this is great news. No fevers, chills, nausea,  vomiting, or diarrhea. In fact I think a lot of the wounds are actually looking a lot better. Electronic Signature(s) Signed: 07/22/2021 3:35:15 PM By: Worthy Keeler PA-[name] [name] Entered By: Worthy Keeler on 07/22/2021 15:35:15 [name] [name], [name] ([id number]) -------------------------------------------------------------------------------- Physician Orders Details Patient Name: [name] [name], [name] Date of Service: 07/22/2021 2:30 PM Medical Record Number: [id number] Patient Account Number: [id number] Date of Birth/Sex: 1926-03-05 (88 y.o. F) Treating RN: Levora Dredge Primary Care Provider: [name] Other Clinician: Referring Provider: [name] Treating Provider/Extender: Skipper Cliche in Treatment: 8 Verbal / Phone Orders: No Diagnosis Coding ICD-10 Coding Code Description [phone] Pressure ulcer of other site, stage 3 I10 Essential (primary) hypertension F01.50 Vascular dementia without behavioral disturbance I25.10 Atherosclerotic heart disease of native coronary artery without angina pectoris Follow-up Appointments o Return Appointment in 3 weeks. - due to holiday Bathing/ Shower/ Hygiene o May shower with wound dressing protected with water repellent cover or cast protector. o No tub bath. Anesthetic (Use 'Patient Medications' Section for Anesthetic Order Entry) o Lidocaine applied to wound bed Edema Control - Lymphedema / Segmental Compressive Device / Other o Tubigrip single layer applied. - left leg size D o Patient to wear own compression stockings. Remove compression stockings every night before going to bed and put on every morning when getting up. - right leg o Elevate legs to the level of the heart and pump ankles as often as possible o Elevate leg(s) parallel to the floor when sitting. o DO YOUR BEST to sleep in the bed at night. DO NOT sleep in your recliner. Long hours of sitting in a recliner leads to swelling of the legs and/or potential  wounds on your backside. Non-Wound Condition o Additional non-wound orders/instructions: - may continue the Betadine to the pressure areas left foot Off-Loading o Other: - Offloading boots while in bed; Try pillow under sheet to keep it in place. Additional Orders / Instructions o Follow Nutritious Diet and Increase Protein Intake Wound Treatment Wound #3 - Foot Wound Laterality: Left, Midline Cleanser: Normal Saline Every Other Day/30 Days Discharge Instructions: Wash your hands with soap and water. Remove old dressing, discard into plastic bag and place into trash. Cleanse the wound with Normal Saline prior to applying a clean dressing using gauze sponges, not tissues or cotton balls. Do not scrub or use excessive force. Pat dry using gauze sponges, not tissue or cotton balls. Primary Dressing: Hydrofera Blue Ready Transfer Foam, 2.5x2.5 (in/in) (Generic) Every Other Day/30 Days Discharge Instructions: Apply Hydrofera Blue Ready to wound bed as directed Secondary Dressing: (SILICONE BORDER) Zetuvit Plus SILICONE BORDER Dressing 4x4 (in/in) Every Other Day/30 Days Discharge Instructions: Please do not put silicone bordered dressings under wraps. Use non-bordered dressing only. Secured With: Tubigrip Size D, 3x10 (in/yd) Every Other Day/30 Days Wound #5 - Toe Great Wound Laterality: Left Cleanser: Normal Saline Every Other Day/30 Days Wooley, Alton L. ([id number]) Discharge Instructions: Wash your hands with soap and water. Remove old dressing, discard into plastic bag and place into trash. Cleanse the wound with Normal Saline prior to applying a clean  dressing using gauze sponges, not tissues or cotton balls. Do not scrub or use excessive force. Pat dry using gauze sponges, not tissue or cotton balls. Cleanser: Soap and Water Every Other Day/30 Days Discharge Instructions: Gently cleanse wound with antibacterial soap, rinse and pat dry prior to dressing wounds Cleanser: Wound  Cleanser Every Other Day/30 Days Discharge Instructions: Wash your hands with soap and water. Remove old dressing, discard into plastic bag and place into trash. Cleanse the wound with Wound Cleanser prior to applying a clean dressing using gauze sponges, not tissues or cotton balls. Do not scrub or use excessive force. Pat dry using gauze sponges, not tissue or cotton balls. Primary Dressing: Hydrofera Blue Ready Transfer Foam, 2.5x2.5 (in/in) Every Other Day/30 Days Discharge Instructions: Apply Hydrofera Blue Ready to wound bed as directed Secondary Dressing: Gauze Every Other Day/30 Days Secured With: Medipore Tape - 20M Medipore H Soft Cloth Surgical Tape, 2x2 (in/yd) Every Other Day/30 Days Wound #6 - Foot Wound Laterality: Left, Lateral Cleanser: Normal Saline Every Other Day/30 Days Discharge Instructions: Wash your hands with soap and water. Remove old dressing, discard into plastic bag and place into trash. Cleanse the wound with Normal Saline prior to applying a clean dressing using gauze sponges, not tissues or cotton balls. Do not scrub or use excessive force. Pat dry using gauze sponges, not tissue or cotton balls. Primary Dressing: Hydrofera Blue Ready Transfer Foam, 2.5x2.5 (in/in) Every Other Day/30 Days Discharge Instructions: Apply Hydrofera Blue Ready to wound bed as directed Secondary Dressing: (SILICONE BORDER) Zetuvit Plus SILICONE BORDER Dressing 4x4 (in/in) Every Other Day/30 Days Discharge Instructions: Please do not put silicone bordered dressings under wraps. Use non-bordered dressing only. Secured With: Tubigrip Size D, 3x10 (in/yd) Every Other Day/30 Days Wound #7 - Lower Leg Wound Laterality: Right Cleanser: Soap and Water Every Other Day/30 Days Discharge Instructions: Gently cleanse wound with antibacterial soap, rinse and pat dry prior to dressing wounds Primary Dressing: Xeroform 5x9-HBD (in/in) Every Other Day/30 Days Discharge Instructions: Apply Xeroform  5x9-HBD (in/in) as directed Secondary Dressing: ABD Pad 5x9 (in/in) Every Other Day/30 Days Discharge Instructions: Cover with ABD pad Secured With: Tubigrip Size D, 3x10 (in/yd) Every Other Day/30 Days Electronic Signature(s) Signed: 07/22/2021 4:19:54 PM By: Worthy Keeler PA-[name] [name] Signed: 07/22/2021 4:41:59 PM By: Levora Dredge Entered By: Levora Dredge on 07/22/2021 15:30:37 [name] [name], [name] ([id number]) -------------------------------------------------------------------------------- Problem List Details Patient Name: [name] [name], [name] Date of Service: 07/22/2021 2:30 PM Medical Record Number: [id number] Patient Account Number: [id number] Date of Birth/Sex: 12-16-1926 (88 y.o. F) Treating RN: Levora Dredge Primary Care Provider: [name] Other Clinician: Referring Provider: [name] Treating Provider/Extender: Skipper Cliche in Treatment: 3 Active Problems ICD-10 Encounter Code Description Active Date MDM Diagnosis L89.893 Pressure ulcer of other site, stage 3 10/02/2020 No Yes S81.811A Laceration without foreign body, right lower leg, initial encounter 07/22/2021 No Yes I10 Essential (primary) hypertension 06/26/2020 No Yes F01.50 Vascular dementia without behavioral disturbance 06/26/2020 No Yes I25.10 Atherosclerotic heart disease of native coronary artery without angina 06/26/2020 No Yes pectoris Inactive Problems Resolved Problems ICD-10 Code Description Active Date Resolved Date L89.623 Pressure ulcer of left heel, stage 3 06/26/2020 06/26/2020 Electronic Signature(s) Signed: 07/22/2021 3:34:44 PM By: Worthy Keeler PA-[name] [name] Previous Signature: 07/22/2021 2:53:18 PM Version By: Worthy Keeler PA-[name] [name] Entered By: Worthy Keeler on 07/22/2021 15:34:44 [name] [name], [name] ([id number]) -------------------------------------------------------------------------------- Progress Note Details Patient Name: [name] [name], [name] Date of Service: 07/22/2021 2:30 PM Medical Record  Number: [id number] Patient  Account Number: [id number] Date of Birth/Sex: 1926/12/22 (88 y.o. F) Treating RN: Levora Dredge Primary Care Provider: [name] Other Clinician: Referring Provider: [name] Treating Provider/Extender: Skipper Cliche in Treatment: 55 Subjective Chief Complaint Information obtained from Patient Left Heel Pressure Ulcer History of Present Illness (HPI) 88 year old patient was recently seen by the PCPs office for significant pain right great toe which has been going on since July. She was initially treated with Keflex which she did not complete and after the office visit this time she has been put on doxycycline. at the time of her visit she was found to have a ulcer on the plantar surface of the right great toe and also had a pyogenic granuloma over this area. X-ray of the right foot done 12/29/2014 -- IMPRESSION:Soft-tissue swelling and ulceration right great toe. No underlying bony lytic lesion identified. If osteomyelitis remains of clinical concern MRI can be obtained. Past medical history significant for anemia, chronic kidney disease stage III, obesity, varicose veins, coronary artery disease, gout, history of nicotine addiction given up smoking in 2002, hypertension, status post cardiac catheterization, status post abdominal hysterectomy, cholecystectomy and tonsillectomy. hemoglobin A1c done in August was 5.8 01/22/2015 -- at this stage the [facility] Walking boat was going to cost them significant amount of money and they want to defer using that at the present time. 01/29/2015 -- she had a podiatry appointment and they have trimmed her toenails. She has not heard back from the vascular office regarding her venous duplex study and I have asked them to call personally so that they can get the appointment soon. 02/06/2015 -- they have made contact with the vascular office and from what I understand that test has been done but the report is  pending. 02/19/2014 -- the vascular test is scheduled for tomorrow Readmission: 06/26/2020 upon evaluation today patient appears for initial evaluation in her clinic that she has been here before in 2016 and has been quite sometime. She did have a fractured hip in February on the 23rd 2022. Subsequently this had to be pinned and she ended up with a pressure injury on her heel following when she was using her foot to help move her around in the bed. Subsequently this has led to the wound that she has been dealing with since that time. She lives at home with her daughter currently she does have dementia. 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 07/03/2020 upon evaluation today patient appears to be doing well with regard to her heel ulcer. She did have arterial studies they appear to be doing excellent it was premature normal across the board with TBI's in the 90s and ABIs well within normal range. Nonetheless there does not appear to be any signs of arterial insufficiency whatsoever. With that being said the patient does have also signs of improvement there is some necrotic tissue in the base of the wound number to try to clear some of that away today I do believe the Iodoflex/Iodosorb is doing well. 5/24; difficult punched out wound on the left medial heel. She has been using Iodoflex 07/17/2020 upon evaluation today patient appears to be doing well at this point in regard to her wound. I do feel like this is a little bit deeper but again that is what is expected as we continue with the Iodoflex I think that this is just going to get deeper until  we get to the base of the wound. With that being said I think we are getting much closer to the base of the wound where we can have healthier tissue that we get a be managing here which that will be awesome. In the meantime I  am not surprised by what I am seeing and in fact the wound appears to be better as compared to previous findings. 07/24/2020 upon evaluation today patient appears to be doing well with regard to her wound. Overall I am extremely pleased with where things stand today. I do not see any signs of active infection which is great and overall I think that the patient is making good progress. There is good to be some need for sharp debridement today. 07/31/2020 upon evaluation today patient appears to be doing better in regard to her heel ulcer. She has been tolerating the dressing changes without complication. Fortunately there does not appear to be any signs of active infection which is great and overall very pleased in this regard. No fevers, chills, nausea, vomiting, or diarrhea. 08/14/2020 upon evaluation today patient appears to be doing better in regard to her heel ulcer. I am very pleased in that regard. Unfortunately she still has a slight deep tissue injury in regard to the medial portion of her foot over the same area. Unfortunately I think this is something that if were not careful it was can open up into the wound. That is my main concern here based on what I see. Fortunately there does not appear to be any evidence of active infection which is great news and overall very pleased with where things stand at this point. 08/21/2020 upon evaluation today patient appears to be doing well with regard to her wound. She has been tolerating the dressing changes without complication. Fortunately there does not appear to be any signs of active infection at this time. No fevers, chills, nausea, vomiting, or diarrhea. I do believe the compression wrap was beneficial for her. 08/28/2020 upon evaluation today patient appears to be doing well with regard to her wounds currently. Fortunately there does not appear to be any signs of active infection at this time. No fevers, chills, nausea, vomiting, or diarrhea. With  that being said I think that her leg is doing quite well to be honest. [name] [name], [name] ([id number]) 09/04/2020 upon evaluation today patient appears to be doing well with regard to her wound on the heel. This is showing signs of good epithelial growth although there is probably can it be an indention where this heals that is okay as long as we get it closed. Fortunately I do not see any evidence of infection at this point. 09/13/2020 upon evaluation today patient appears to be doing well with regard to her wounds. She has been tolerating the dressing changes without complication. Fortunately he is actually doing extremely well and I think she is making great progress this is measuring smaller. I would recommend that such that we continue with the [facility] likely since he is doing so well. 09/18/2020 upon evaluation today patient appears to be doing well with regard to her heel ulcer. She is making good progress and I am very pleased with what we see today. I think the Hydrofera Blue is still doing excellent. 10/02/2020 upon evaluation today patient's wound actually appears to be showing signs of good improvement in regard to the heel. I am very pleased in this regard. With that being said I do believe that the area which  is somewhat been stable and dry is starting to lift up and beginning to clear this away as well that is on the foot. Subsequently I am going to have to perform some debridement here and we did actually go ahead and allow this as a wound today as before it was just more deep tissue injury and eschar covering now I think it is a little bit more than that to be honest. 10/09/2020 upon evaluation today patient appears to be doing decently well in regard to her wounds. I am actually very pleased with where things stand today. There does not appear to be any signs of active infection which is great news. No fevers, chills, nausea, vomiting, or diarrhea. She is going require some sharp  debridement today. 10/16/2020 upon evaluation today patient appears to be doing decently well in regard to her heel as well as the foot. Both are showing signs of good improvement which is great news. In general and extremely pleased with where things stand at this point. She is tolerating the [facility] well although this is getting so small in the heel I think collagen may be a better option here based on what I am seeing. With regard to the foot the Iodoflex/Iodosorb is still probably the best method here. 10/26/2020 upon evaluation today patient actually appears to be doing well in some respects today. She has been tolerating the dressing changes without complication and this is great news. The Hydrofera Blue is done well for the heel this actually appears to be healed today. With that being said in regard to the foot I think we are ready to switch to [facility] based on what I see at this point. 10/30/2020 upon evaluation today patient appears to be doing well with regard to his wound. He has been tolerating the dressing changes without complication. Fortunately there does not appear to be any signs of active infection systemically at this time which is great news and overall very pleased with where things stand today. I do think that she is making progress although it somewhat slow still where showing signs of improvement little by little here. 11/06/2020 upon evaluation today patient appears to be doing decently well in regard to her wound. We will start and see better overall appearance of the base of the wound which is great news she still continues to have some abnormal fluorescence signals with a Molecu[employer] DX that will be detailed below. 11/20/2020 upon evaluation today patient's wound actually showing signs of improvement. There is good to be some need for sharp debridement today and that was discussed with the patient. I am going to go ahead and clean this up but I do believe the  [facility] is doing a great job. 11/27/2020; the patient had eschar over the original heel wound which I removed with a #3 curette there was nothing open here. The area is on the lateral left foot foot. 12/04/2020 upon evaluation today patient appears to be doing well with regard to her wound. The wound bed actually appeared to be doing well after I remove the dry endoform from the wound bed. This I tracked little bit of fluid but nonetheless underneath this appears to be doing awesome. I am very pleased with where things stand today. No fevers, chills, nausea, vomiting, or diarrhea. 12/11/2020 upon evaluation today patient appears to be doing well with regard to her wound although it keeps getting dry and filled up with the endoform I am not seeing any signs  of infection but we are going to have to clean this away in order to try and get things moving in an appropriate direction. I discussed that with patient's daughter today as well. She is in agreement with proceeding as such. 12/21/2020 upon evaluation today patient appears to be doing well with regard to her wound this did not seal off like it was with the collagen but nonetheless also did not really feeling quite as much as I would like to have seen. I do believe that we may need to go ahead and see about doing a debridement today and I really feel like we may want to switch back to the [facility] which I felt like was doing better in the past. Good news is the base of the wound appears healthy with good granulation tissue. 12/28/2020 upon evaluation patient's wound actually showed signs of doing quite well in regard to her foot. I think that we getting very close to complete resolution and overall I am extremely happy with where things stand today. 01/04/2021 upon evaluation today patient appears to be doing well with regard to her wound on the foot. This is good to require little bit of debridement today but overall seems to be doing  quite well. I am actually very pleased with where we stand currently. 01/08/2021 upon evaluation today patient's wound actually showing signs of some improvement although she still has some callus buildup around the edges of the wound unfortunately I think this is continuing to rub despite what we are doing currently. I think that we may need to go ahead and see about switching up the dressing a little bit I Minna recommend collagen and then on top of the collagen we will get a use some foam double layered cut in a doughnut to try to take pressure off of this area we will see how this does over the next week. 01/15/2021 upon evaluation today patient's wound is actually showing signs of being completely dried over with the collagen. This is definitely not what we are looking for. I think that the wound dried out too quickly is the main issue that we are running into at this point and I really think we may need to do something else try to keep that from occurring. 01/22/2021 upon evaluation today patient's wound actually did stay open it did not callus over as its been doing in the past this is good news. Also feel like it may not be quite as deep as what we have previously seen. There is still not as much improvement he does what I would like to see but nonetheless I think we are headed in the right direction. 01/29/2021 upon evaluation today patient appears to be doing okay in regard to her wound is not too dry but unfortunately is too wet the Xeroform just did not do what I was hoping that she made things a little bit worse as far as size wise I am actually going to try at this point considering this is more open but not as deep the Hydrofera Blue to see if this will be beneficial at this time. Again we seem to be cycling through things and it is becoming a little difficult to get this to completely close but in the past Hydrofera Blue is what she did the best with 1 week to keep [name] [name], [name]  ([id number]) it from closing up prematurely. 02/05/2021 upon evaluation today patient's wound actually showing some signs of improvement which is great  news. Fortunately I do not see any evidence of active infection locally nor systemically. It is definitely not too wet which is been the biggest issue we have seen up to this point over the past several weeks. 02/21/2021 upon evaluation today patient appears to be doing excellent in regard to her wound. This actually appears much improved compared to last time I saw her which is great news as well. No fevers, chills, nausea, vomiting, or diarrhea. 02/28/2021 upon evaluation today patient's wound on her foot actually appears to be doing quite well. I am very pleased with where things stand today. I do not see any evidence of active infection locally nor systemically at this time. No fevers, chills, nausea, vomiting, or diarrhea. 03/14/2021 upon evaluation today patient appears to be doing well with regard to her wound. There is a little bit of a limp on the plantar edge of the wound opening which I Minna see about trying to clear away today. Fortunately I think other than this everything seems to be showing signs of improvement measurements still are roughly the same. It is definitely not as deep however. 03/28/2021 upon evaluation today patient appears to be doing well with regard to her wound. There is still some slough noted in the base of the wound. Fortunately there does not appear to be any evidence of active infection locally nor systemically at this time which is good news. 04/11/2021 upon evaluation today patient appears to be doing decently well in regard to her foot ulcer. This is showing a little bit of moisture buildup but fortunately nothing too significant I think the wound is looking better. This is just very slow to heal. Fortunately I do not see any evidence of active infection at this time. 04/18/2021 upon evaluation today patient appears to  be doing well with regard to her ulcer. In fact this is getting very close to completely resolved. I am actually extremely pleased with where things stand today and how this appears. 05/02/2021 upon evaluation patient appears to be doing really about the same in regard to her wound I do not see any signs of significant worsening which is great news and overall very pleased in that regard. With that being said I do feel like that the patient is continuing to have really stalling of the wound she also has a couple areas that appear to be deep tissue injuries that are somewhat unfortunate to be perfectly honest. With that being said I do see evidence here in general of improvement which is good with regard to the depth of the wound we just need to try to protect her legs and hopefully her feet while allowing this last little bit to heal. Patient's daughter is doing an excellent job taking care of her and I definitely do not see any issues in that regard at all she is very diligent in trying to keep pressure off as far as the feet are concerned. Nonetheless I do believe the patient still despite all of this is still getting several areas of deep tissue injury which raises the question as to why is this a blood flow issue or something else in the past she really has done well and had no significant blood flow limitations with her ABIs of May 2020 to be excellent and normal. We will see how things progress and if we need to repeat this we will definitely do so. 05/09/2021 upon evaluation today patient unfortunately appears to not be doing quite as well as what  I previously saw. In fact her feet seem to be showing signs of breakdown at several locations as well as the wound. In general she has not been eating or drinking well I am kind of concerned that she may be shutting down to some degree. 05-23-2021 on evaluation today patient appears to be doing well all things considered with regard to her wound. She does  have a little bit of erythema around the medial foot ulcer none of the other deep tissue injuries have open which is good news. Fortunately I see no evidence of active infection locally nor systemically at this time which is also great news. No fevers, chills, nausea, vomiting, or diarrhea. 06-06-2021 upon evaluation today patient appears to be doing better in regard to her foot ulcers. Everything is actually showing signs of improvement which is great news. Fortunately I do not see any signs of active infection locally nor systemically which is great news. In fact her overall coloring of her foot seems better and she is also not having as much pain as she was. Her blood pressure is also more stable and had been dropping very low obviously I think that that is probably a big part of why things are looking better. 4/27; using Hydrofera Blue and 2 wounds on the left foot on the tip of the left great toe and on the left medial foot 5/8; the patient however has 3 wounds on the left foot. 1 at the tip of the left great toe, 1 medially on the left foot and another on the lateral fifth metatarsal head. Using Hydrofera Blue in all wounds. She does not complain of pain. 07-01-2021 upon evaluation today patient appears to be doing well with regard to her wounds everything is appearing to do well no signs of infection in general and very pleased with where we stand today. There does not appear to be any signs of active infection locally or systemically which is great news. No fevers, chills, nausea, vomiting, or diarrhea. 07-22-2021 upon evaluation today patient appears to be doing well in general in regard to her wound she does have a new wound on the right shin this is due to a trauma that is a small skin tear. This happened in the past week. With that being said overall she seems to be doing quite well I do not see any evidence of a infection which is great news. No fevers, chills, nausea, vomiting, or  diarrhea. Objective Constitutional Well-nourished and well-hydrated in no acute distress. Vitals Time Taken: 2:45 PM, Height: 58 in, Weight: 137 lbs, BMI: 28.6, Temperature: 98.1 F, Pulse: 83 bpm, Respiratory Rate: 18 breaths/min, Blood Pressure: 117/74 mmHg. [name] [name], [name] ([id number]) Respiratory normal breathing without difficulty. Psychiatric this patient is able to make decisions and demonstrates good insight into disease process. Alert and Oriented x 3. pleasant and cooperative. General Notes: Upon inspection patient's wound bed actually showed signs of good granulation epithelization at this point. Fortunately I do not see any evidence of worsening overall and this is great news. No fevers, chills, nausea, vomiting, or diarrhea. In fact I think a lot of the wounds are actually looking a lot better. Integumentary (Hair, Skin) Wound #3 status is Open. Original cause of wound was Pressure Injury. The date acquired was: 10/02/2020. The wound has been in treatment 41 weeks. The wound is located on the Left,Midline Foot. The wound measures 0.4cm length x 0.2cm width x 0.1cm depth; 0.063cm^2 area and 0.006cm^3 volume. There is Fat Layer (Subcutaneous  Tissue) exposed. There is no tunneling or undermining noted. There is a medium amount of serosanguineous drainage noted. The wound margin is thickened. There is large (67-100%) pink granulation within the wound bed. There is a small (1-33%) amount of necrotic tissue within the wound bed including Adherent Slough. Wound #5 status is Open. Original cause of wound was Pressure Injury. The date acquired was: 06/03/2021. The wound has been in treatment 6 weeks. The wound is located on the Left Toe Great. The wound measures 0.3cm length x 0.2cm width x 0.1cm depth; 0.047cm^2 area and 0.005cm^3 volume. There is Fat Layer (Subcutaneous Tissue) exposed. There is no tunneling noted, however, there is undermining starting at 6:00 and ending at 12:00 with  a maximum distance of 0.1cm. There is a medium amount of serosanguineous drainage noted. The wound margin is thickened. There is medium (34-66%) red, pink granulation within the wound bed. There is a medium (34-66%) amount of necrotic tissue within the wound bed including Adherent Slough. Wound #6 status is Open. Original cause of wound was Gradually Appeared. The date acquired was: 06/24/2021. The wound has been in treatment 4 weeks. The wound is located on the Left,Lateral Foot. The wound measures 0.4cm length x 0.3cm width x 0.1cm depth; 0.094cm^2 area and 0.009cm^3 volume. There is no tunneling or undermining noted. There is a medium amount of serosanguineous drainage noted. There is small (1-33%) pink granulation within the wound bed. There is a large (67-100%) amount of necrotic tissue within the wound bed including Eschar. Wound #7 status is Open. Original cause of wound was Trauma. The date acquired was: 07/08/2021. The wound is located on the Right Lower Leg. The wound measures 1cm length x 1.5cm width x 0.1cm depth; 1.178cm^2 area and 0.118cm^3 volume. There is no tunneling or undermining noted. There is a medium amount of serosanguineous drainage noted. There is no granulation within the wound bed. There is a large (67-100%) amount of necrotic tissue within the wound bed including Eschar and Adherent Slough. Assessment Active Problems ICD-10 Pressure ulcer of other site, stage 3 Laceration without foreign body, right lower leg, initial encounter Essential (primary) hypertension Vascular dementia without behavioral disturbance Atherosclerotic heart disease of native coronary artery without angina pectoris Plan Follow-up Appointments: Return Appointment in 3 weeks. - due to holiday Bathing/ Shower/ Hygiene: May shower with wound dressing protected with water repellent cover or cast protector. No tub bath. Anesthetic (Use 'Patient Medications' Section for Anesthetic Order  Entry): Lidocaine applied to wound bed Edema Control - Lymphedema / Segmental Compressive Device / Other: Tubigrip single layer applied. - left leg size D Patient to wear own compression stockings. Remove compression stockings every night before going to bed and put on every morning when getting up. - right leg Elevate legs to the level of the heart and pump ankles as often as possible Elevate leg(s) parallel to the floor when sitting. DO YOUR BEST to sleep in the bed at night. DO NOT sleep in your recliner. Long hours of sitting in a recliner leads to swelling of the legs and/or potential wounds on your backside. Non-Wound Condition: Additional non-wound orders/instructions: - may continue the Betadine to the pressure areas left foot Off-Loading: Other: - Offloading boots while in bed; Try pillow under sheet to keep it in place. Additional Orders / Instructions: Cieslinski, Chenoa L. ([id number]) Follow Nutritious Diet and Increase Protein Intake WOUND #3: - Foot Wound Laterality: Left, Midline Cleanser: Normal Saline Every Other Day/30 Days Discharge Instructions: Wash your hands with soap  and water. Remove old dressing, discard into plastic bag and place into trash. Cleanse the wound with Normal Saline prior to applying a clean dressing using gauze sponges, not tissues or cotton balls. Do not scrub or use excessive force. Pat dry using gauze sponges, not tissue or cotton balls. Primary Dressing: Hydrofera Blue Ready Transfer Foam, 2.5x2.5 (in/in) (Generic) Every Other Day/30 Days Discharge Instructions: Apply Hydrofera Blue Ready to wound bed as directed Secondary Dressing: (SILICONE BORDER) Zetuvit Plus SILICONE BORDER Dressing 4x4 (in/in) Every Other Day/30 Days Discharge Instructions: Please do not put silicone bordered dressings under wraps. Use non-bordered dressing only. Secured With: Tubigrip Size D, 3x10 (in/yd) Every Other Day/30 Days WOUND #5: - Toe Great Wound Laterality:  Left Cleanser: Normal Saline Every Other Day/30 Days Discharge Instructions: Wash your hands with soap and water. Remove old dressing, discard into plastic bag and place into trash. Cleanse the wound with Normal Saline prior to applying a clean dressing using gauze sponges, not tissues or cotton balls. Do not scrub or use excessive force. Pat dry using gauze sponges, not tissue or cotton balls. Cleanser: Soap and Water Every Other Day/30 Days Discharge Instructions: Gently cleanse wound with antibacterial soap, rinse and pat dry prior to dressing wounds Cleanser: Wound Cleanser Every Other Day/30 Days Discharge Instructions: Wash your hands with soap and water. Remove old dressing, discard into plastic bag and place into trash. Cleanse the wound with Wound Cleanser prior to applying a clean dressing using gauze sponges, not tissues or cotton balls. Do not scrub or use excessive force. Pat dry using gauze sponges, not tissue or cotton balls. Primary Dressing: Hydrofera Blue Ready Transfer Foam, 2.5x2.5 (in/in) Every Other Day/30 Days Discharge Instructions: Apply Hydrofera Blue Ready to wound bed as directed Secondary Dressing: Gauze Every Other Day/30 Days Secured With: Medipore Tape - 27M Medipore H Soft Cloth Surgical Tape, 2x2 (in/yd) Every Other Day/30 Days WOUND #6: - Foot Wound Laterality: Left, Lateral Cleanser: Normal Saline Every Other Day/30 Days Discharge Instructions: Wash your hands with soap and water. Remove old dressing, discard into plastic bag and place into trash. Cleanse the wound with Normal Saline prior to applying a clean dressing using gauze sponges, not tissues or cotton balls. Do not scrub or use excessive force. Pat dry using gauze sponges, not tissue or cotton balls. Primary Dressing: Hydrofera Blue Ready Transfer Foam, 2.5x2.5 (in/in) Every Other Day/30 Days Discharge Instructions: Apply Hydrofera Blue Ready to wound bed as directed Secondary Dressing: (SILICONE  BORDER) Zetuvit Plus SILICONE BORDER Dressing 4x4 (in/in) Every Other Day/30 Days Discharge Instructions: Please do not put silicone bordered dressings under wraps. Use non-bordered dressing only. Secured With: Tubigrip Size D, 3x10 (in/yd) Every Other Day/30 Days WOUND #7: - Lower Leg Wound Laterality: Right Cleanser: Soap and Water Every Other Day/30 Days Discharge Instructions: Gently cleanse wound with antibacterial soap, rinse and pat dry prior to dressing wounds Primary Dressing: Xeroform 5x9-HBD (in/in) Every Other Day/30 Days Discharge Instructions: Apply Xeroform 5x9-HBD (in/in) as directed Secondary Dressing: ABD Pad 5x9 (in/in) Every Other Day/30 Days Discharge Instructions: Cover with ABD pad Secured With: Tubigrip Size D, 3x10 (in/yd) Every Other Day/30 Days 1. I would recommend that we go ahead and continue with wound care measures as before and the patient is in agreement with the plan. This includes the use of the Hydrofera Blue to the left foot regions which is doing quite well. 2. Also, recommend Xeroform to the right leg. 3. I would also suggest that we continue with  an ABD pad to cover on the right leg and Tubigrip size D and on the left for using Tubigrip as well and ABD pads to cover the open wound locations at this time. We will see patient back for reevaluation in 1 week here in the clinic. If anything worsens or changes patient will contact our office for additional recommendations. Electronic Signature(s) Signed: 07/22/2021 3:35:58 PM By: Worthy Keeler PA-[name] [name] Entered By: Worthy Keeler on 07/22/2021 15:35:58 Reichart, Anniebell L. ([id number]) -------------------------------------------------------------------------------- SuperBill Details Patient Name: Helt, Kambryn L. Date of Service: 07/22/2021 Medical Record Number: [id number] Patient Account Number: [id number] Date of Birth/Sex: 1926/05/30 (88 y.o. F) Treating RN: Levora Dredge Primary Care Provider: [name] Other Clinician: Referring Provider: [name] Treating Provider/Extender: Skipper Cliche in Treatment: 55 Diagnosis Coding ICD-10 Codes Code Description [phone] Pressure ulcer of other site, stage 3 S81.811A Laceration without foreign body, right lower leg, initial encounter [place] (primary) hypertension I25.10 Atherosclerotic heart disease of native coronary artery without angina pectoris Facility Procedures CPT4 Code: [id number] Description: 99214 - WOUND CARE VISIT-LEV 4 EST PT Modifier: Quantity: 1 Physician Procedures CPT4 Code: [id number] Description: 99213 - WC PHYS LEVEL 3 - EST PT Modifier: Quantity: 1 CPT4 Code: Description: ICD-10 Diagnosis Description L89.893 Pressure ulcer of other site, stage 3 S81.811A Laceration without foreign body, right lower leg, initial encounter I10 Essential (primary) hypertension Modifier: Quantity: Electronic Signature(s) Signed: 07/22/2021 4:13:25 PM By: Levora Dredge Signed: 07/22/2021 4:19:54 PM By: Worthy Keeler PA-[name] [name] Previous Signature: 07/22/2021 3:36:29 PM Version By: Worthy Keeler PA-[name] [name] Entered By: Levora Dredge on 07/22/2021 16:13:25

## 2021-07-22 NOTE — Progress Notes (Signed)
Hardacre, Celica L. ([id number]) Visit Report for 07/22/2021 Arrival Information Details Patient Name: [name] [name], [name] Date of Service: 07/22/2021 2:30 PM Medical Record Number: [id number] Patient Account Number: [id number] Date of Birth/Sex: November 08, 1926 (88 y.o. F) Treating RN: Levora Dredge Primary Care Moriyah Byington: [name] Other Clinician: Referring Hektor Huston: [name] Treating Daquisha Clermont/Extender: Skipper Cliche in Treatment: 68 Visit Information History Since Last Visit Added or deleted any medications: No Patient Arrived: Wheel Chair Any new allergies or adverse reactions: No Arrival Time: 14:50 Had a fall or experienced change in Yes Accompanied By: daughter activities of daily living that may affect Transfer Assistance: EasyPivot Patient Lift risk of falls: Patient Identification Verified: Yes Hospitalized since last visit: No Secondary Verification Process Completed: Yes Has Dressing in Place as Prescribed: Yes Patient Requires Transmission-Based No Pain Present Now: No Precautions: Patient Has Alerts: Yes Patient Alerts: NOT diabetic ABI R1.18 L1.25 06/27/20 Electronic Signature(s) Signed: 07/22/2021 4:41:59 PM By: Levora Dredge Entered By: Levora Dredge on 07/22/2021 14:50:54 Pinnix, Seda L. ([id number]) -------------------------------------------------------------------------------- Clinic Level of Care Assessment Details Patient Name: [name] [name], [name] Date of Service: 07/22/2021 2:30 PM Medical Record Number: [id number] Patient Account Number: [id number] Date of Birth/Sex: 08-18-1926 (88 y.o. F) Treating RN: Levora Dredge Primary Care Nehemie Casserly: [name] Other Clinician: Referring Santina Trillo: [name] Treating Evany Schecter/Extender: Skipper Cliche in Treatment: 55 Clinic Level of Care Assessment Items TOOL 4 Quantity Score []  - Use when only an EandM is performed on FOLLOW-UP visit 0 ASSESSMENTS - Nursing Assessment / Reassessment X -  Reassessment of Co-morbidities (includes updates in patient status) 1 10 []  - 0 Reassessment of Adherence to Treatment Plan ASSESSMENTS - Wound and Skin Assessment / Reassessment []  - Simple Wound Assessment / Reassessment - one wound 0 X- 4 5 Complex Wound Assessment / Reassessment - multiple wounds []  - 0 Dermatologic / Skin Assessment (not related to wound area) ASSESSMENTS - Focused Assessment X - Circumferential Edema Measurements - multi extremities 1 5 []  - 0 Nutritional Assessment / Counseling / Intervention []  - 0 Lower Extremity Assessment (monofilament, tuning fork, pulses) []  - 0 Peripheral Arterial Disease Assessment (using hand held doppler) ASSESSMENTS - Ostomy and/or Continence Assessment and Care []  - Incontinence Assessment and Management 0 []  - 0 Ostomy Care Assessment and Management (repouching, etc.) PROCESS - Coordination of Care X - Simple Patient / Family Education for ongoing care 1 15 []  - 0 Complex (extensive) Patient / Family Education for ongoing care []  - 0 Staff obtains Programmer, systems, Records, Test Results / Process Orders []  - 0 Staff telephones HHA, Nursing Homes / Clarify orders / etc []  - 0 Routine Transfer to another Facility (non-emergent condition) []  - 0 Routine Hospital Admission (non-emergent condition) []  - 0 New Admissions / Biomedical engineer / Ordering NPWT, Apligraf, etc. []  - 0 Emergency Hospital Admission (emergent condition) X- 1 10 Simple Discharge Coordination []  - 0 Complex (extensive) Discharge Coordination PROCESS - Special Needs []  - Pediatric / Minor Patient Management 0 []  - 0 Isolation Patient Management []  - 0 Hearing / Language / Visual special needs []  - 0 Assessment of Community assistance (transportation, D/C planning, etc.) []  - 0 Additional assistance / Altered mentation []  - 0 Support Surface(s) Assessment (bed, cushion, seat, etc.) INTERVENTIONS - Wound Cleansing / Measurement [name], Zoha L.  ([id number]) []  - 0 Simple Wound Cleansing - one wound X- 4 5 Complex Wound Cleansing - multiple wounds X- 1 5 Wound Imaging (photographs - any number of wounds) []  - 0 Wound Tracing (  instead of photographs) []  - 0 Simple Wound Measurement - one wound X- 4 5 Complex Wound Measurement - multiple wounds INTERVENTIONS - Wound Dressings X - Small Wound Dressing one or multiple wounds 4 10 []  - 0 Medium Wound Dressing one or multiple wounds []  - 0 Large Wound Dressing one or multiple wounds X- 1 5 Application of Medications - topical []  - 0 Application of Medications - injection INTERVENTIONS - Miscellaneous []  - External ear exam 0 []  - 0 Specimen Collection (cultures, biopsies, blood, body fluids, etc.) []  - 0 Specimen(s) / Culture(s) sent or taken to Lab for analysis []  - 0 Patient Transfer (multiple staff / Civil Service fast streamer / Similar devices) []  - 0 Simple Staple / Suture removal (25 or less) []  - 0 Complex Staple / Suture removal (26 or more) []  - 0 Hypo / Hyperglycemic Management (close monitor of Blood Glucose) []  - 0 Ankle / Brachial Index (ABI) - do not check if billed separately X- 1 5 Vital Signs Has the patient been seen at the hospital within the last three years: Yes Total Score: 155 Level Of Care: New/Established - Level 4 Electronic Signature(s) Signed: 07/22/2021 4:41:59 PM By: Levora Dredge Entered By: Levora Dredge on 07/22/2021 16:13:16 [name] [name], [name] ([id number]) -------------------------------------------------------------------------------- Encounter Discharge Information Details Patient Name: [name] [name], [name] Date of Service: 07/22/2021 2:30 PM Medical Record Number: [id number] Patient Account Number: [id number] Date of Birth/Sex: Jul 02, 1926 (88 y.o. F) Treating RN: Levora Dredge Primary Care Nakima Fluegge: [name] Other Clinician: Referring Floyce Bujak: [name] Treating Deavion Dobbs/Extender: Skipper Cliche in Treatment: 90 Encounter  Discharge Information Items Discharge Condition: Stable Ambulatory Status: Wheelchair Discharge Destination: Home Transportation: Private Auto Accompanied By: daughter Schedule Follow-up Appointment: Yes Clinical Summary of Care: Electronic Signature(s) Signed: 07/22/2021 4:14:19 PM By: Levora Dredge Entered By: Levora Dredge on 07/22/2021 16:14:18 Homes, Triumph ([id number]) -------------------------------------------------------------------------------- Lower Extremity Assessment Details Patient Name: [name] [name], [name] Date of Service: 07/22/2021 2:30 PM Medical Record Number: [id number] Patient Account Number: [id number] Date of Birth/Sex: [birth year] (88 y.o. F) Treating RN: Levora Dredge Primary Care Mirinda Monte: [name] Other Clinician: Referring Shantera Monts: [name] Treating Odalys Win/Extender: Skipper Cliche in Treatment: 55 Edema Assessment Assessed: [Left: No] [Right: No] Edema: [Left: Yes] [Right: Yes] Calf Left: Right: Point of Measurement: 36 cm From Medial Instep 31.5 cm Ankle Left: Right: Point of Measurement: 9 cm From Medial Instep 21.4 cm Electronic Signature(s) Signed: 07/22/2021 4:41:59 PM By: Levora Dredge Entered By: Levora Dredge on 07/22/2021 15:05:00 Dulski, Stephane L. ([id number]) -------------------------------------------------------------------------------- Multi Wound Chart Details Patient Name: [name] [name], [name] Date of Service: 07/22/2021 2:30 PM Medical Record Number: [id number] Patient Account Number: [id number] Date of Birth/Sex: 09-26-1926 (88 y.o. F) Treating RN: Levora Dredge Primary Care Jonet Mathies: [name] Other Clinician: Referring Rewa Weissberg: [name] Treating Chairty Toman/Extender: Skipper Cliche in Treatment: 77 Vital Signs Height(in): 58 Pulse(bpm): 9 Weight(lbs): 137 Blood Pressure(mmHg): 117/74 Body Mass Index(BMI): 28.6 Temperature(F): 98.1 Respiratory Rate(breaths/min): 18 Photos: Wound  Location: Left, Midline Foot Left Toe Great Left, Lateral Foot Wounding Event: Pressure Injury Pressure Injury Gradually Appeared Primary Etiology: Pressure Ulcer Pressure Ulcer Pressure Ulcer Comorbid History: Cataracts, Anemia, Coronary Artery Cataracts, Anemia, Coronary Artery Cataracts, Anemia, Coronary Artery Disease, Hypertension, Myocardial Disease, Hypertension, Myocardial Disease, Hypertension, Myocardial Infarction, End Stage Renal Infarction, End Stage Renal Infarction, End Stage Renal Disease, Gout, Osteoarthritis, Disease, Gout, Osteoarthritis, Disease, Gout, Osteoarthritis, Dementia Dementia Dementia Date Acquired: 10/02/2020 06/03/2021 06/24/2021 Weeks of Treatment: 41 6 4 Wound Status: Open Open Open Wound Recurrence: No No  No Measurements L x W x D (cm) 0.4x0.2x0.1 0.3x0.2x0.1 0.4x0.3x0.1 Area (cm) : 0.063 0.047 0.094 Volume (cm) : 0.006 0.005 0.009 % Reduction in Area: 79.90% 96.40% -49.20% % Reduction in Volume: 80.60% 96.20% -50.00% Starting Position 1 (o'clock): 6 Ending Position 1 (o'clock): 12 Maximum Distance 1 (cm): 0.1 Undermining: No Yes No Classification: Category/Stage III Category/Stage II Category/Stage II Exudate Amount: Medium Medium Medium Exudate Type: Serosanguineous Serosanguineous Serosanguineous Exudate Color: red, brown red, brown red, brown Wound Margin: Thickened Thickened N/A Granulation Amount: Large (67-100%) Medium (34-66%) Small (1-33%) Granulation Quality: Pink Red, Pink Pink Necrotic Amount: Small (1-33%) Medium (34-66%) Large (67-100%) Necrotic Tissue: Adherent [place] Exposed Structures: Fat Layer (Subcutaneous Tissue): Fat Layer (Subcutaneous Tissue): N/A Yes Yes Fascia: No Fascia: No Tendon: No Tendon: No Muscle: No Muscle: No Joint: No Joint: No Bone: No Bone: No Epithelialization: Small (1-33%) None Small (1-33%) Wound Number: 7 N/A N/A Photos: N/A N/A Barrington, Wilhelmine L. ([id number]) Wound  Location: Right Lower Leg N/A N/A Wounding Event: Trauma N/A N/A Primary Etiology: Trauma, Other N/A N/A Comorbid History: Cataracts, Anemia, Coronary Artery N/A N/A Disease, Hypertension, Myocardial Infarction, End Stage Renal Disease, Gout, Osteoarthritis, Dementia Date Acquired: 07/08/2021 N/A N/A Weeks of Treatment: 0 N/A N/A Wound Status: Open N/A N/A Wound Recurrence: No N/A N/A Measurements L x W x D (cm) 1x1.5x0.1 N/A N/A Area (cm) : 1.178 N/A N/A Volume (cm) : 0.118 N/A N/A % Reduction in Area: N/A N/A N/A % Reduction in Volume: N/A N/A N/A Undermining: No N/A N/A Classification: Full Thickness Without Exposed N/A N/A Support Structures Exudate Amount: Medium N/A N/A Exudate Type: Serosanguineous N/A N/A Exudate Color: red, brown N/A N/A Wound Margin: N/A N/A N/A Granulation Amount: None Present (0%) N/A N/A Granulation Quality: N/A N/A N/A Necrotic Amount: Large (67-100%) N/A N/A Necrotic Tissue: Eschar, Adherent Slough N/A N/A Exposed Structures: N/A N/A N/A Epithelialization: None N/A N/A Treatment Notes Electronic Signature(s) Signed: 07/22/2021 4:41:59 PM By: Levora Dredge Entered By: Levora Dredge on 07/22/2021 15:13:10 [place], Rochella L. ([id number]) -------------------------------------------------------------------------------- [place] Details Patient Name: [name] [name], [name] Date of Service: 07/22/2021 2:30 PM Medical Record Number: [id number] Patient Account Number: [id number] Date of Birth/Sex: [birth year] (88 y.o. F) Treating RN: Levora Dredge Primary Care Maxi Rodas: [name] Other Clinician: Referring Adriel Kessen: [name] Treating Nadie Fiumara/Extender: Skipper Cliche in Treatment: 72 Active Inactive Electronic Signature(s) Signed: 07/22/2021 4:41:59 PM By: Levora Dredge Entered By: Levora Dredge on 07/22/2021 15:12:59 Starr, Casee L.  ([id number]) -------------------------------------------------------------------------------- Pain Assessment Details Patient Name: [name] [name], [name] Date of Service: 07/22/2021 2:30 PM Medical Record Number: [id number] Patient Account Number: [id number] Date of Birth/Sex: [birth year] (88 y.o. F) Treating RN: Levora Dredge Primary Care Kian Ottaviano: [name] Other Clinician: Referring Xaden Kaufman: [name] Treating Alto Gandolfo/Extender: Skipper Cliche in Treatment: 79 Active Problems Location of Pain Severity and Description of Pain Patient Has Paino No Site Locations Rate the pain. Current Pain Level: 0 Pain Management and Medication Current Pain Management: Electronic Signature(s) Signed: 07/22/2021 4:41:59 PM By: Levora Dredge Entered By: Levora Dredge on 07/22/2021 14:51:21 [name] [name], [name] ([id number]) -------------------------------------------------------------------------------- Patient/Caregiver Education Details Patient Name: [name] [name], [name] Date of Service: 07/22/2021 2:30 PM Medical Record Number: [id number] Patient Account Number: [id number] Date of Birth/Gender: 1927/01/28 (88 y.o. F) Treating RN: Levora Dredge Primary Care Physician: [name] Other Clinician: Referring Physician: [name] Treating Physician/Extender: Skipper Cliche in Treatment: 40 Education Assessment Education Provided To: Caregiver Education Topics Provided Wound/Skin Impairment: Handouts: Caring for Your Ulcer  Methods: Explain/Verbal Responses: State content correctly Electronic Signature(s) Signed: 07/22/2021 4:41:59 PM By: Levora Dredge Entered By: Levora Dredge on 07/22/2021 16:13:37 [name] [name], [name] ([id number]) -------------------------------------------------------------------------------- Wound Assessment Details Patient Name: [name] [name], [name] Date of Service: 07/22/2021 2:30 PM Medical Record Number: [id number] Patient Account Number:  [id number] Date of Birth/Sex: 1926-08-09 (88 y.o. F) Treating RN: Levora Dredge Primary Care Aharon Carriere: [name] Other Clinician: Referring Luccas Towell: [name] Treating Granvil Djordjevic/Extender: Skipper Cliche in Treatment: 49 Wound Status Wound Number: 3 Primary Pressure Ulcer Etiology: Wound Location: Left, Midline Foot Wound Open Wounding Event: Pressure Injury Status: Date Acquired: 10/02/2020 Comorbid Cataracts, Anemia, Coronary Artery Disease, Weeks Of Treatment: 41 History: Hypertension, Myocardial Infarction, End Stage Renal Clustered Wound: No Disease, Gout, Osteoarthritis, Dementia Photos Wound Measurements Length: (cm) 0.4 Width: (cm) 0.2 Depth: (cm) 0.1 Area: (cm) 0.063 Volume: (cm) 0.006 % Reduction in Area: 79.9% % Reduction in Volume: 80.6% Epithelialization: Small (1-33%) Tunneling: No Undermining: No Wound Description Classification: Category/Stage III Wound Margin: Thickened Exudate Amount: Medium Exudate Type: Serosanguineous Exudate Color: red, brown Foul Odor After Cleansing: No Slough/Fibrino Yes Wound Bed Granulation Amount: Large (67-100%) Exposed Structure Granulation Quality: Pink Fascia Exposed: No Necrotic Amount: Small (1-33%) Fat Layer (Subcutaneous Tissue) Exposed: Yes Necrotic Quality: Adherent Slough Tendon Exposed: No Muscle Exposed: No Joint Exposed: No Bone Exposed: No Treatment Notes Wound #3 (Foot) Wound Laterality: Left, Midline Cleanser Normal Saline Discharge Instruction: Wash your hands with soap and water. Remove old dressing, discard into plastic bag and place into trash. Cleanse the wound with Normal Saline prior to applying a clean dressing using gauze sponges, not tissues or cotton balls. Do not [place], Chaniya L. ([id number]) scrub or use excessive force. Pat dry using gauze sponges, not tissue or cotton balls. Peri-Wound Care Topical Primary Dressing Hydrofera Blue Ready Transfer Foam, 2.5x2.5  (in/in) Discharge Instruction: Apply Hydrofera Blue Ready to wound bed as directed Secondary Dressing (SILICONE BORDER) Zetuvit Plus SILICONE BORDER Dressing 4x4 (in/in) Discharge Instruction: Please do not put silicone bordered dressings under wraps. Use non-bordered dressing only. Secured With Tubigrip Size D, 3x10 (in/yd) Compression Wrap Compression Stockings Add-Ons Electronic Signature(s) Signed: 07/22/2021 4:41:59 PM By: Levora Dredge Entered By: Levora Dredge on 07/22/2021 15:02:01 [name] [name], Cinzia L. ([id number]) -------------------------------------------------------------------------------- Wound Assessment Details Patient Name: Spurrier, Aymar L. Date of Service: 07/22/2021 2:30 PM Medical Record Number: [id number] Patient Account Number: [id number] Date of Birth/Sex: December 24, 1926 (88 y.o. F) Treating RN: Levora Dredge Primary Care Rohin Krejci: [name] Other Clinician: Referring Shawntina Diffee: [name] Treating Meredyth Hornung/Extender: Skipper Cliche in Treatment: 45 Wound Status Wound Number: 5 Primary Pressure Ulcer Etiology: Wound Location: Left Toe Great Wound Open Wounding Event: Pressure Injury Status: Date Acquired: 06/03/2021 Comorbid Cataracts, Anemia, Coronary Artery Disease, Weeks Of Treatment: 6 History: Hypertension, Myocardial Infarction, End Stage Renal Clustered Wound: No Disease, Gout, Osteoarthritis, Dementia Photos Wound Measurements Length: (cm) 0.3 % Reduc Width: (cm) 0.2 % Reduc Depth: (cm) 0.1 Epithel Area: (cm) 0.047 Tunnel Volume: (cm) 0.005 Underm Star Endi Maxi tion in Area: 96.4% tion in Volume: 96.2% ialization: None ing: No ining: Yes ting Position (o'clock): 6 ng Position (o'clock): 12 mum Distance: (cm) 0.1 Wound Description Classification: Category/Stage II Foul O Wound Margin: Thickened Slough Exudate Amount: Medium Exudate Type: Serosanguineous Exudate Color: red, brown dor After Cleansing: No /Fibrino  Yes Wound Bed Granulation Amount: Medium (34-66%) Exposed Structure Granulation Quality: Red, Pink Fascia Exposed: No Necrotic Amount: Medium (34-66%) Fat Layer (Subcutaneous Tissue) Exposed: Yes Necrotic Quality: Adherent Slough Tendon Exposed: No Muscle Exposed:  No Joint Exposed: No Bone Exposed: No Treatment Notes Wound #5 (Toe Great) Wound Laterality: Left Cleanser Altschuler, Saleha L. ([id number]) Normal Saline Discharge Instruction: Wash your hands with soap and water. Remove old dressing, discard into plastic bag and place into trash. Cleanse the wound with Normal Saline prior to applying a clean dressing using gauze sponges, not tissues or cotton balls. Do not scrub or use excessive force. Pat dry using gauze sponges, not tissue or cotton balls. Soap and Water Discharge Instruction: Gently cleanse wound with antibacterial soap, rinse and pat dry prior to dressing wounds Wound Cleanser Discharge Instruction: Wash your hands with soap and water. Remove old dressing, discard into plastic bag and place into trash. Cleanse the wound with Wound Cleanser prior to applying a clean dressing using gauze sponges, not tissues or cotton balls. Do not scrub or use excessive force. Pat dry using gauze sponges, not tissue or cotton balls. Peri-Wound Care Topical Primary Dressing Hydrofera Blue Ready Transfer Foam, 2.5x2.5 (in/in) Discharge Instruction: Apply Hydrofera Blue Ready to wound bed as directed Secondary Dressing Gauze Secured With Medipore Tape - 31M Medipore H Soft Cloth Surgical Tape, 2x2 (in/yd) Compression Wrap Compression Stockings Add-Ons Electronic Signature(s) Signed: 07/22/2021 4:41:59 PM By: Levora Dredge Entered By: Levora Dredge on 07/22/2021 15:02:50 Dewan, Sharonne L. ([id number]) -------------------------------------------------------------------------------- Wound Assessment Details Patient Name: Randazzo, Jalea L. Date of Service: 07/22/2021 2:30 PM Medical  Record Number: [id number] Patient Account Number: [id number] Date of Birth/Sex: [birth year] (88 y.o. F) Treating RN: Levora Dredge Primary Care Darrion Wyszynski: [name] Other Clinician: Referring Maurice Fotheringham: [name] Treating Ashling Roane/Extender: Skipper Cliche in Treatment: 64 Wound Status Wound Number: 6 Primary Pressure Ulcer Etiology: Wound Location: Left, Lateral Foot Wound Open Wounding Event: Gradually Appeared Status: Date Acquired: 06/24/2021 Comorbid Cataracts, Anemia, Coronary Artery Disease, Weeks Of Treatment: 4 History: Hypertension, Myocardial Infarction, End Stage Renal Clustered Wound: No Disease, Gout, Osteoarthritis, Dementia Photos Wound Measurements Length: (cm) 0.4 Width: (cm) 0.3 Depth: (cm) 0.1 Area: (cm) 0.094 Volume: (cm) 0.009 % Reduction in Area: -49.2% % Reduction in Volume: -50% Epithelialization: Small (1-33%) Tunneling: No Undermining: No Wound Description Classification: Category/Stage II Exudate Amount: Medium Exudate Type: Serosanguineous Exudate Color: red, brown Foul Odor After Cleansing: No Slough/Fibrino Yes Wound Bed Granulation Amount: Small (1-33%) Granulation Quality: Pink Necrotic Amount: Large (67-100%) Necrotic Quality: Eschar Treatment Notes Wound #6 (Foot) Wound Laterality: Left, Lateral Cleanser Normal Saline Discharge Instruction: Wash your hands with soap and water. Remove old dressing, discard into plastic bag and place into trash. Cleanse the wound with Normal Saline prior to applying a clean dressing using gauze sponges, not tissues or cotton balls. Do not scrub or use excessive force. Pat dry using gauze sponges, not tissue or cotton balls. Peri-Wound Care Topical Edelstein, Lacie L. ([id number]) Primary Dressing Hydrofera Blue Ready Transfer Foam, 2.5x2.5 (in/in) Discharge Instruction: Apply Hydrofera Blue Ready to wound bed as directed Secondary Dressing (SILICONE BORDER) [place]  Dressing 4x4 (in/in) Discharge Instruction: Please do not put silicone bordered dressings under wraps. Use non-bordered dressing only. Secured With Tubigrip Size D, 3x10 (in/yd) Compression Wrap Compression Stockings Add-Ons Electronic Signature(s) Signed: 07/22/2021 4:41:59 PM By: Levora Dredge Entered By: Levora Dredge on 07/22/2021 15:03:24 Suddreth, Amelita L. ([id number]) -------------------------------------------------------------------------------- Wound Assessment Details Patient Name: Yehle, Fruma L. Date of Service: 07/22/2021 2:30 PM Medical Record Number: [id number] Patient Account Number: [id number] Date of Birth/Sex: 10-01-1926 (88 y.o. F) Treating RN: Levora Dredge Primary Care Deshayla Empson: [name] Other Clinician: Referring Jamol Ginyard: [name] Treating Jahlil Ziller/Extender:  Jeri Cos Weeks in Treatment: 109 Wound Status Wound Number: 7 Primary Trauma, Other Etiology: Wound Location: Right Lower Leg Wound Open Wounding Event: Trauma Status: Date Acquired: 07/08/2021 Comorbid Cataracts, Anemia, Coronary Artery Disease, Weeks Of Treatment: 0 History: Hypertension, Myocardial Infarction, End Stage Renal Clustered Wound: No Disease, Gout, Osteoarthritis, Dementia Photos Wound Measurements Length: (cm) 1 Width: (cm) 1.5 Depth: (cm) 0.1 Area: (cm) 1.178 Volume: (cm) 0.118 % Reduction in Area: % Reduction in Volume: Epithelialization: None Tunneling: No Undermining: No Wound Description Classification: Full Thickness Without Exposed Support Structu Exudate Amount: Medium Exudate Type: Serosanguineous Exudate Color: red, brown res Foul Odor After Cleansing: No Slough/Fibrino Yes Wound Bed Granulation Amount: None Present (0%) Necrotic Amount: Large (67-100%) Necrotic Quality: Eschar, Adherent Slough Treatment Notes Wound #7 (Lower Leg) Wound Laterality: Right Cleanser Soap and Water Discharge Instruction: Gently cleanse wound with  antibacterial soap, rinse and pat dry prior to dressing wounds Peri-Wound Care Topical Primary Dressing Xeroform 5x9-HBD (in/in) Vallery, Berdella L. ([id number]) Discharge Instruction: Apply Xeroform 5x9-HBD (in/in) as directed Secondary Dressing ABD Pad 5x9 (in/in) Discharge Instruction: Cover with ABD pad Secured With Tubigrip Size D, 3x10 (in/yd) Compression Wrap Compression Stockings Add-Ons Electronic Signature(s) Signed: 07/22/2021 4:41:59 PM By: Levora Dredge Entered By: Levora Dredge on 07/22/2021 15:04:33 Brenneman, [place] ([id number]) -------------------------------------------------------------------------------- Vitals Details Patient Name: Ignasiak, Danayah L. Date of Service: 07/22/2021 2:30 PM Medical Record Number: [id number] Patient Account Number: [id number] Date of Birth/Sex: January 25, 1927 (88 y.o. F) Treating RN: Levora Dredge Primary Care Bransyn Adami: [name] Other Clinician: Referring Americo Vallery: [name] Treating Shawn Carattini/Extender: Skipper Cliche in Treatment: 55 Vital Signs Time Taken: 14:45 Temperature (F): 98.1 Height (in): 58 Pulse (bpm): 83 Weight (lbs): 137 Respiratory Rate (breaths/min): 18 Body Mass Index (BMI): 28.6 Blood Pressure (mmHg): 117/74 Reference Range: 80 - 120 mg / dl Electronic Signature(s) Signed: 07/22/2021 4:41:59 PM By: Levora Dredge Entered By: Levora Dredge on 07/22/2021 14:51:14

## 2021-08-01 ENCOUNTER — Other Ambulatory Visit: Payer: PPO | Admitting: Student

## 2021-08-01 DIAGNOSIS — K5900 Constipation, unspecified: Secondary | ICD-10-CM

## 2021-08-01 DIAGNOSIS — Z515 Encounter for palliative care: Secondary | ICD-10-CM | POA: Diagnosis not present

## 2021-08-01 DIAGNOSIS — L89899 Pressure ulcer of other site, unspecified stage: Secondary | ICD-10-CM | POA: Diagnosis not present

## 2021-08-01 DIAGNOSIS — F0150 Vascular dementia without behavioral disturbance: Secondary | ICD-10-CM

## 2021-08-01 NOTE — Progress Notes (Signed)
[facility] Palliative Care Consult Note Telephone: [phone]  Fax: [phone]    Date of encounter: 08/01/21 11:38 AM PATIENT NAME: [name] [name] [address] [address]   [phone] (home)  DOB: [birth year] MRN: [id number] PRIMARY CARE PROVIDER:    Birdie Sons, MD,  [address] [place] [address] [phone]  REFERRING PROVIDER:   Birdie [name], [address] [place] [address],  [place] [address] [phone]  RESPONSIBLE PARTY:    Contact Information     Name Relation Home Work Mobile   [name],[name] Daughter [phone]  [phone]        I met face to face with patient and family in the home. Palliative Care was asked to follow this patient by consultation request of  Fisher, [name] Peri, MD to address advance care planning and complex medical decision making. This is a follow up visit.                                   ASSESSMENT AND PLAN / RECOMMENDATIONS:   Advance Care Planning/Goals of Care: Goals include to maximize quality of life and symptom management. Patient/health care surrogate gave his/her permission to discuss. Our advance care planning conversation included a discussion about:    The value and importance of advance care planning  Experiences with loved ones who have been seriously ill or have died  Exploration of personal, cultural or spiritual beliefs that might influence medical decisions  Exploration of goals of care in the event of a sudden injury or illness  CODE STATUS: DNR  Symptom Management/Plan:  Vascular dementia-patient requiring more assistance with adl's. She is no longer ambulating, w/c being used for locomotion. She is having difficulty following/processing directions. She is also refusing most of her medications. Daughter is having difficulty with bathing, incontinent care turning and repositioning in bed.   DME Recommendation:   Hospital bed  recommend hospital bed due to dx of Vascular dementia, edema, patient requires frequent changes in body position and the patient requires positioning of the body in ways not feasible with an ordinary bed.    Wounds to Left foot-continue wound care as directed by wound clinic. Follow up with wound clinic as scheduled.   Constipation-continue miralax daily, add fiber to diet such as prunes.   Follow up Palliative Care Visit: Palliative care will continue to follow for complex medical decision making, advance care planning, and clarification of goals. Return in 8 weeks or prn.   This visit was coded based on medical decision making (MDM).  PPS: 40%  HOSPICE ELIGIBILITY/DIAGNOSIS: TBD  Chief Complaint: Palliative Medicine follow up visit.   HISTORY OF PRESENT ILLNESS:  [name] [name] is a 88 y.o. year old female  with vascular dementia, CAD, hypertension, PVD, DVT of right lower extremity- no longer on Eliquis, GERD, hypothyroidism, CKD 4, gout.   Patient having increased difficulty transferring, she is no longer ambulating. Daughter now having to transfer her via stand and pivot. Had a fall 4 weeks ago where she tried to transfer herself. New wound on RLE, continued treatment to left foot wounds. Daughter changing dressings every 2 -3 days and she is going to wound clinic every couple of weeks. Endorses good appetite, but not drinking well. She is now refusing most medications except for her levothyroxine. Patient does endorse some constipation; takes miralax. Daughter contributes to patient's HPI and  ROS due to her dementia.  Patient observed sitting at dining room table in w/c. She is leaning to right side. Patient's dentures are ill fitting. She has pleasant affect; able to answer direct questions.    History obtained from review of EMR, discussion with primary team, and interview with family, facility staff/caregiver and/or [name]  I reviewed available labs, medications, imaging,  studies and related documents from the EMR.  Records reviewed and summarized above.   Physical Exam:  Pulse 76, resp 16, b/p 122/60, sats 97% on room air Constitutional: NAD General: frail appearing, thin  EYES: anicteric sclera, lids intact, no discharge  ENMT: intact hearing, oral mucous membranes moist CV: S1S2, RRR, trace LE edema Pulmonary: LCTA, no increased work of breathing, no cough, room air Abdomen: normo-active BS + 4 quadrants, soft and non tender, no ascites GU: deferred MSK: moves all extremities, non-ambulatory Skin: warm and dry, dressings CDI to left great toe and left lateral foot, right shin Neuro: generalized weakness  Psych: non-anxious affect, A and O x to person, familiars, forgetful Hem/lymph/immuno: no widespread bruising   Thank you for the opportunity to participate in the care of [name]  The palliative care team will continue to follow. Please call our office at [phone] if we can be of additional assistance.   [name] Slocumb, NP   COVID-19 PATIENT SCREENING TOOL Asked and negative response unless otherwise noted:   Have you had symptoms of covid, tested positive or been in contact with someone with symptoms/positive test in the past 5-10 days? No

## 2021-08-02 ENCOUNTER — Telehealth: Payer: Self-pay

## 2021-08-02 NOTE — Telephone Encounter (Signed)
1046 am  Incoming call from [facility] regarding hospital bed.  Discussed estimated amount for fully electric vs semi electric.  [name] [name] would like to proceed with fully electric.  She is aware insurance will likely not pay and this will be private pay.  Jeanie aware she can decline order once final numbers are received from [employer].  Order for hospital bed sent through [facility] for NP signature and processing with [employer].

## 2021-08-02 NOTE — Telephone Encounter (Signed)
918 am.  Family requesting information on cost for semi-electric vs fully electric bed.  Information obtained from [employer] and phone call made to daughter Noreene Larsson.  No answer.  Message left requesting a call back.

## 2021-08-05 ENCOUNTER — Ambulatory Visit: Payer: PPO | Admitting: Physician Assistant

## 2021-08-09 DIAGNOSIS — F0150 Vascular dementia without behavioral disturbance: Secondary | ICD-10-CM | POA: Diagnosis not present

## 2021-08-12 ENCOUNTER — Encounter: Payer: PPO | Admitting: Physician Assistant

## 2021-08-12 DIAGNOSIS — L89893 Pressure ulcer of other site, stage 3: Secondary | ICD-10-CM | POA: Diagnosis not present

## 2021-09-02 ENCOUNTER — Encounter: Payer: PPO | Attending: Physician Assistant | Admitting: Physician Assistant

## 2021-09-02 DIAGNOSIS — F0150 Vascular dementia without behavioral disturbance: Secondary | ICD-10-CM | POA: Insufficient documentation

## 2021-09-02 DIAGNOSIS — N183 Chronic kidney disease, stage 3 unspecified: Secondary | ICD-10-CM | POA: Diagnosis not present

## 2021-09-02 DIAGNOSIS — X58XXXA Exposure to other specified factors, initial encounter: Secondary | ICD-10-CM | POA: Diagnosis not present

## 2021-09-02 DIAGNOSIS — I2510 Atherosclerotic heart disease of native coronary artery without angina pectoris: Secondary | ICD-10-CM | POA: Insufficient documentation

## 2021-09-02 DIAGNOSIS — S51811A Laceration without foreign body of right forearm, initial encounter: Secondary | ICD-10-CM | POA: Diagnosis not present

## 2021-09-02 DIAGNOSIS — L97522 Non-pressure chronic ulcer of other part of left foot with fat layer exposed: Secondary | ICD-10-CM | POA: Diagnosis not present

## 2021-09-02 DIAGNOSIS — L89893 Pressure ulcer of other site, stage 3: Secondary | ICD-10-CM | POA: Diagnosis not present

## 2021-09-02 DIAGNOSIS — I120 Hypertensive chronic kidney disease with stage 5 chronic kidney disease or end stage renal disease: Secondary | ICD-10-CM | POA: Insufficient documentation

## 2021-09-02 DIAGNOSIS — I10 Essential (primary) hypertension: Secondary | ICD-10-CM | POA: Diagnosis not present

## 2021-09-02 NOTE — Progress Notes (Signed)
[name], [name] ([id number]) Visit Report for 09/02/2021 Chief Complaint Document Details Patient Name: [name], [name] Date of Service: 09/02/2021 1:00 PM Medical Record Number: [id number] Patient Account Number: [id number] Date of Birth/Sex: 10-30-1926 (88 y.o. F) Treating RN: Levora Dredge Primary Care Provider: [name] Other Clinician: Referring Provider: [name] Treating Provider/Extender: Skipper Cliche in Treatment: 61 Information Obtained from: Patient Chief Complaint Left LE Ulcers on leg and foot and skin tear right arm Electronic Signature(s) Signed: 09/02/2021 2:43:33 PM By: Worthy Keeler PA-C Previous Signature: 09/02/2021 1:00:54 PM Version By: Worthy Keeler PA-C Entered By: Worthy Keeler on 09/02/2021 14:43:33 [name], [name] ([id number]) -------------------------------------------------------------------------------- HPI Details Patient Name: [name], [name] Date of Service: 09/02/2021 1:00 PM Medical Record Number: [id number] Patient Account Number: [id number] Date of Birth/Sex: August 15, 1926 (88 y.o. F) Treating RN: Levora Dredge Primary Care Provider: [name] Other Clinician: Referring Provider: [name] Treating Provider/Extender: Skipper Cliche in Treatment: 54 History of Present Illness HPI Description: 88 year old patient was recently seen by the PCPs office for significant pain right great toe which has been going on since July. She was initially treated with Keflex which she did not complete and after the office visit this time she has been put on doxycycline. at the time of her visit she was found to have a ulcer on the plantar surface of the right great toe and also had a pyogenic granuloma over this area. X-ray of the right foot done 12/29/2014 -- IMPRESSION:Soft-tissue swelling and ulceration right great toe. No underlying bony lytic lesion identified. If osteomyelitis remains of clinical concern MRI can be  obtained. Past medical history significant for anemia, chronic kidney disease stage III, obesity, varicose veins, coronary artery disease, gout, history of nicotine addiction given up smoking in 2002, hypertension, status post cardiac catheterization, status post abdominal hysterectomy, cholecystectomy and tonsillectomy. hemoglobin A1c done in August was 5.8 01/22/2015 -- at this stage the [facility] was going to cost them significant amount of money and they want to defer using that at the present time. 01/29/2015 -- she had a podiatry appointment and they have trimmed her toenails. She has not heard back from the vascular office regarding her venous duplex study and I have asked them to call personally so that they can get the appointment soon. 02/06/2015 -- they have made contact with the vascular office and from what I understand that test has been done but the report is pending. 02/19/2014 -- the vascular test is scheduled for tomorrow Readmission: 06/26/2020 upon evaluation today patient appears for initial evaluation in her clinic that she has been here before in 2016 and has been quite sometime. She did have a fractured hip in February on the 23rd 2022. Subsequently this had to be pinned and she ended up with a pressure injury on her heel following when she was using her foot to help move her around in the bed. Subsequently this has led to the wound that she has been dealing with since that time. She lives at home with her daughter currently she does have dementia. 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 07/03/2020 upon evaluation today patient appears to be doing well with regard to her heel ulcer. She did have arterial studies they appear to be doing excellent it was premature normal across the board with TBI's in the 90s and ABIs well within normal  range. Nonetheless there does not appear to be any signs of arterial insufficiency whatsoever. With that being said the patient does have also signs of improvement there is some necrotic tissue in the base of the wound number to try to clear some of that away today I do believe the Iodoflex/Iodosorb is doing well. 5/24; difficult punched out wound on the left medial heel. She has been using Iodoflex 07/17/2020 upon evaluation today patient appears to be doing well at this point in regard to her wound. I do feel like this is a little bit deeper but again that is what is expected as we continue with the Iodoflex I think that this is just going to get deeper until we get to the base of the wound. With that being said I think we are getting much closer to the base of the wound where we can have healthier tissue that we get a be managing here which that will be awesome. In the meantime I am not surprised by what I am seeing and in fact the wound appears to be better as compared to previous findings. 07/24/2020 upon evaluation today patient appears to be doing well with regard to her wound. Overall I am extremely pleased with where things stand today. I do not see any signs of active infection which is great and overall I think that the patient is making good progress. There is good to be some need for sharp debridement today. 07/31/2020 upon evaluation today patient appears to be doing better in regard to her heel ulcer. She has been tolerating the dressing changes without complication. Fortunately there does not appear to be any signs of active infection which is great and overall very pleased in this regard. No fevers, chills, nausea, vomiting, or diarrhea. 08/14/2020 upon evaluation today patient appears to be doing better in regard to her heel ulcer. I am very pleased in that regard. Unfortunately she still has a slight deep tissue injury in regard to the medial portion of her foot over the same area.  Unfortunately I think this is something that if were not careful it was can open up into the wound. That is my main concern here based on what I see. Fortunately there does not appear to be any evidence of active infection which is great news and overall very pleased with where things stand at this point. 08/21/2020 upon evaluation today patient appears to be doing well with regard to her wound. She has been tolerating the dressing changes without complication. Fortunately there does not appear to be any signs of active infection at this time. No fevers, chills, nausea, vomiting, or diarrhea. I do believe the compression wrap was beneficial for her. 08/28/2020 upon evaluation today patient appears to be doing well with regard to her wounds currently. Fortunately there does not appear to be any signs of active infection at this time. No fevers, chills, nausea, vomiting, or diarrhea. With that being said I think that her leg is doing quite well to be honest. 09/04/2020 upon evaluation today patient appears to be doing well with regard to her wound on the heel. This is showing signs of good epithelial growth although there is probably can it be an indention where this heals that is okay as long as we get it closed. Fortunately I do not see any evidence of infection at this point. 09/13/2020 upon evaluation today patient appears to be doing well with regard to her wounds. She has been tolerating the dressing changes Boesen,  Melia L. ([id number]) without complication. Fortunately he is actually doing extremely well and I think she is making great progress this is measuring smaller. I would recommend that such that we continue with the [facility] likely since he is doing so well. 09/18/2020 upon evaluation today patient appears to be doing well with regard to her heel ulcer. She is making good progress and I am very pleased with what we see today. I think the Hydrofera Blue is still doing  excellent. 10/02/2020 upon evaluation today patient's wound actually appears to be showing signs of good improvement in regard to the heel. I am very pleased in this regard. With that being said I do believe that the area which is somewhat been stable and dry is starting to lift up and beginning to clear this away as well that is on the foot. Subsequently I am going to have to perform some debridement here and we did actually go ahead and allow this as a wound today as before it was just more deep tissue injury and eschar covering now I think it is a little bit more than that to be honest. 10/09/2020 upon evaluation today patient appears to be doing decently well in regard to her wounds. I am actually very pleased with where things stand today. There does not appear to be any signs of active infection which is great news. No fevers, chills, nausea, vomiting, or diarrhea. She is going require some sharp debridement today. 10/16/2020 upon evaluation today patient appears to be doing decently well in regard to her heel as well as the foot. Both are showing signs of good improvement which is great news. In general and extremely pleased with where things stand at this point. She is tolerating the [facility] well although this is getting so small in the heel I think collagen may be a better option here based on what I am seeing. With regard to the foot the Iodoflex/Iodosorb is still probably the best method here. 10/26/2020 upon evaluation today patient actually appears to be doing well in some respects today. She has been tolerating the dressing changes without complication and this is great news. The Hydrofera Blue is done well for the heel this actually appears to be healed today. With that being said in regard to the foot I think we are ready to switch to [facility] based on what I see at this point. 10/30/2020 upon evaluation today patient appears to be doing well with regard to his wound. He has  been tolerating the dressing changes without complication. Fortunately there does not appear to be any signs of active infection systemically at this time which is great news and overall very pleased with where things stand today. I do think that she is making progress although it somewhat slow still where showing signs of improvement little by little here. 11/06/2020 upon evaluation today patient appears to be doing decently well in regard to her wound. We will start and see better overall appearance of the base of the wound which is great news she still continues to have some abnormal fluorescence signals with a MolecuLight DX that will be detailed below. 11/20/2020 upon evaluation today patient's wound actually showing signs of improvement. There is good to be some need for sharp debridement today and that was discussed with the patient. I am going to go ahead and clean this up but I do believe the [facility] is doing a great job. 11/27/2020; the patient had eschar over the original  heel wound which I removed with a #3 curette there was nothing open here. The area is on the lateral left foot foot. 12/04/2020 upon evaluation today patient appears to be doing well with regard to her wound. The wound bed actually appeared to be doing well after I remove the dry endoform from the wound bed. This I tracked little bit of fluid but nonetheless underneath this appears to be doing awesome. I am very pleased with where things stand today. No fevers, chills, nausea, vomiting, or diarrhea. 12/11/2020 upon evaluation today patient appears to be doing well with regard to her wound although it keeps getting dry and filled up with the endoform I am not seeing any signs of infection but we are going to have to clean this away in order to try and get things moving in an appropriate direction. I discussed that with patient's daughter today as well. She is in agreement with proceeding as such. 12/21/2020 upon  evaluation today patient appears to be doing well with regard to her wound this did not seal off like it was with the collagen but nonetheless also did not really feeling quite as much as I would like to have seen. I do believe that we may need to go ahead and see about doing a debridement today and I really feel like we may want to switch back to the [facility] which I felt like was doing better in the past. Good news is the base of the wound appears healthy with good granulation tissue. 12/28/2020 upon evaluation patient's wound actually showed signs of doing quite well in regard to her foot. I think that we getting very close to complete resolution and overall I am extremely happy with where things stand today. 01/04/2021 upon evaluation today patient appears to be doing well with regard to her wound on the foot. This is good to require little bit of debridement today but overall seems to be doing quite well. I am actually very pleased with where we stand currently. 01/08/2021 upon evaluation today patient's wound actually showing signs of some improvement although she still has some callus buildup around the edges of the wound unfortunately I think this is continuing to rub despite what we are doing currently. I think that we may need to go ahead and see about switching up the dressing a little bit I Minna recommend collagen and then on top of the collagen we will get a use some foam double layered cut in a doughnut to try to take pressure off of this area we will see how this does over the next week. 01/15/2021 upon evaluation today patient's wound is actually showing signs of being completely dried over with the collagen. This is definitely not what we are looking for. I think that the wound dried out too quickly is the main issue that we are running into at this point and I really think we may need to do something else try to keep that from occurring. 01/22/2021 upon evaluation today  patient's wound actually did stay open it did not callus over as its been doing in the past this is good news. Also feel like it may not be quite as deep as what we have previously seen. There is still not as much improvement he does what I would like to see but nonetheless I think we are headed in the right direction. 01/29/2021 upon evaluation today patient appears to be doing okay in regard to her wound is not too dry  but unfortunately is too wet the Xeroform just did not do what I was hoping that she made things a little bit worse as far as size wise I am actually going to try at this point considering this is more open but not as deep the Hydrofera Blue to see if this will be beneficial at this time. Again we seem to be cycling through things and it is becoming a little difficult to get this to completely close but in the past Hydrofera Blue is what she did the best with 1 week to keep it from closing up prematurely. 02/05/2021 upon evaluation today patient's wound actually showing some signs of improvement which is great news. Fortunately I do not see any evidence of active infection locally nor systemically. It is definitely not too wet which is been the biggest issue we have seen up to this point over the past several weeks. Joo, William L. ([id number]) 02/21/2021 upon evaluation today patient appears to be doing excellent in regard to her wound. This actually appears much improved compared to last time I saw her which is great news as well. No fevers, chills, nausea, vomiting, or diarrhea. 02/28/2021 upon evaluation today patient's wound on her foot actually appears to be doing quite well. I am very pleased with where things stand today. I do not see any evidence of active infection locally nor systemically at this time. No fevers, chills, nausea, vomiting, or diarrhea. 03/14/2021 upon evaluation today patient appears to be doing well with regard to her wound. There is a little bit of a limp  on the plantar edge of the wound opening which I Minna see about trying to clear away today. Fortunately I think other than this everything seems to be showing signs of improvement measurements still are roughly the same. It is definitely not as deep however. 03/28/2021 upon evaluation today patient appears to be doing well with regard to her wound. There is still some slough noted in the base of the wound. Fortunately there does not appear to be any evidence of active infection locally nor systemically at this time which is good news. 04/11/2021 upon evaluation today patient appears to be doing decently well in regard to her foot ulcer. This is showing a little bit of moisture buildup but fortunately nothing too significant I think the wound is looking better. This is just very slow to heal. Fortunately I do not see any evidence of active infection at this time. 04/18/2021 upon evaluation today patient appears to be doing well with regard to her ulcer. In fact this is getting very close to completely resolved. I am actually extremely pleased with where things stand today and how this appears. 05/02/2021 upon evaluation patient appears to be doing really about the same in regard to her wound I do not see any signs of significant worsening which is great news and overall very pleased in that regard. With that being said I do feel like that the patient is continuing to have really stalling of the wound she also has a couple areas that appear to be deep tissue injuries that are somewhat unfortunate to be perfectly honest. With that being said I do see evidence here in general of improvement which is good with regard to the depth of the wound we just need to try to protect her legs and hopefully her feet while allowing this last little bit to heal. Patient's daughter is doing an excellent job taking care of her and I definitely do not  see any issues in that regard at all she is very diligent in trying to keep  pressure off as far as the feet are concerned. Nonetheless I do believe the patient still despite all of this is still getting several areas of deep tissue injury which raises the question as to why is this a blood flow issue or something else in the past she really has done well and had no significant blood flow limitations with her ABIs of May 2020 to be excellent and normal. We will see how things progress and if we need to repeat this we will definitely do so. 05/09/2021 upon evaluation today patient unfortunately appears to not be doing quite as well as what I previously saw. In fact her feet seem to be showing signs of breakdown at several locations as well as the wound. In general she has not been eating or drinking well I am kind of concerned that she may be shutting down to some degree. 05-23-2021 on evaluation today patient appears to be doing well all things considered with regard to her wound. She does have a little bit of erythema around the medial foot ulcer none of the other deep tissue injuries have open which is good news. Fortunately I see no evidence of active infection locally nor systemically at this time which is also great news. No fevers, chills, nausea, vomiting, or diarrhea. 06-06-2021 upon evaluation today patient appears to be doing better in regard to her foot ulcers. Everything is actually showing signs of improvement which is great news. Fortunately I do not see any signs of active infection locally nor systemically which is great news. In fact her overall coloring of her foot seems better and she is also not having as much pain as she was. Her blood pressure is also more stable and had been dropping very low obviously I think that that is probably a big part of why things are looking better. 4/27; using Hydrofera Blue and 2 wounds on the left foot on the tip of the left great toe and on the left medial foot 5/8; the patient however has 3 wounds on the left foot. 1 at the  tip of the left great toe, 1 medially on the left foot and another on the lateral fifth metatarsal head. Using Hydrofera Blue in all wounds. She does not complain of pain. 07-01-2021 upon evaluation today patient appears to be doing well with regard to her wounds everything is appearing to do well no signs of infection in general and very pleased with where we stand today. There does not appear to be any signs of active infection locally or systemically which is great news. No fevers, chills, nausea, vomiting, or diarrhea. 07-22-2021 upon evaluation today patient appears to be doing well in general in regard to her wound she does have a new wound on the right shin this is due to a trauma that is a small skin tear. This happened in the past week. With that being said overall she seems to be doing quite well I do not see any evidence of a infection which is great news. No fevers, chills, nausea, vomiting, or diarrhea. 08-12-2021 upon evaluation today patient appears to be doing well with regard to her feet. Overall the wound on the right leg is healed the wound on the left foot area at all locations seem to be doing okay I do not see any signs of obvious active infection at this time which is great news. 09-02-2021 upon  evaluation today patient appears to be doing well currently in regard to her wounds on the legs specifically her feet. She has been tolerating the dressing changes without complication. Good news is she seems to be tolerating the dressing changes without any complication and is doing much better the only issue she has she does have a skin tear on the right forearm that actually happened on the way into the clinic today. Electronic Signature(s) Signed: 09/02/2021 1:54:50 PM By: Worthy Keeler PA-C Entered By: Worthy Keeler on 09/02/2021 13:54:50 [name], [name] ([id number]) -------------------------------------------------------------------------------- Physical Exam Details Patient  Name: Wigger, Avanthika L. Date of Service: 09/02/2021 1:00 PM Medical Record Number: [id number] Patient Account Number: [id number] Date of Birth/Sex: February 23, 1926 (88 y.o. F) Treating RN: Levora Dredge Primary Care Provider: [name] Other Clinician: Referring Provider: [name] Treating Provider/Extender: Skipper Cliche in Treatment: 36 Constitutional Well-nourished and well-hydrated in no acute distress. Respiratory normal breathing without difficulty. Psychiatric this patient is able to make decisions and demonstrates good insight into disease process. Alert and Oriented x 3. pleasant and cooperative. Notes Upon inspection patient's wounds are actually showing signs of excellent improvement at this time which is great news on the foot I am extremely pleased. With that being said the patient does have an issue here with a wound on the right forearm that is a skin tear that occurred actually when she was coming into the clinic today. I did have to perform Steri-Strips after reapproximating the edges of the wound and she tolerated that today without complication. Electronic Signature(s) Signed: 09/02/2021 2:44:29 PM By: Worthy Keeler PA-C Entered By: Worthy Keeler on 09/02/2021 14:44:29 [name], [name] ([id number]) -------------------------------------------------------------------------------- Physician Orders Details Patient Name: [name], [name] Date of Service: 09/02/2021 1:00 PM Medical Record Number: [id number] Patient Account Number: [id number] Date of Birth/Sex: [birth year] (88 y.o. F) Treating RN: Levora Dredge Primary Care Provider: [name] Other Clinician: Referring Provider: [name] Treating Provider/Extender: Skipper Cliche in Treatment: 47 Verbal / Phone Orders: No Diagnosis Coding ICD-10 Coding Code Description [phone] Pressure ulcer of other site, stage 3 S81.811A Laceration without foreign body, right lower leg, initial  encounter I10 Essential (primary) hypertension F01.50 Vascular dementia without behavioral disturbance I25.10 Atherosclerotic heart disease of native coronary artery without angina pectoris Follow-up Appointments o Return Appointment in 3 weeks. - due to holiday Bathing/ Shower/ Hygiene o May shower with wound dressing protected with water repellent cover or cast protector. o No tub bath. Anesthetic (Use 'Patient Medications' Section for Anesthetic Order Entry) o Lidocaine applied to wound bed Edema Control - Lymphedema / Segmental Compressive Device / Other o Tubigrip single layer applied. - left leg size D o Patient to wear own compression stockings. Remove compression stockings every night before going to bed and put on every morning when getting up. - right leg o Elevate legs to the level of the heart and pump ankles as often as possible o Elevate leg(s) parallel to the floor when sitting. o DO YOUR BEST to sleep in the bed at night. DO NOT sleep in your recliner. Long hours of sitting in a recliner leads to swelling of the legs and/or potential wounds on your backside. Non-Wound Condition o Additional non-wound orders/instructions: - may continue the Betadine to the pressure areas left foot Off-Loading o Other: - Offloading boots while in bed; Try pillow under sheet to keep it in place. Additional Orders / Instructions o Follow Nutritious Diet and Increase Protein Intake Wound Treatment Wound #3 -  Foot Wound Laterality: Left, Midline Cleanser: Normal Saline Every Other Day/30 Days Discharge Instructions: Wash your hands with soap and water. Remove old dressing, discard into plastic bag and place into trash. Cleanse the wound with Normal Saline prior to applying a clean dressing using gauze sponges, not tissues or cotton balls. Do not scrub or use excessive force. Pat dry using gauze sponges, not tissue or cotton balls. Topical: Gentamicin Every Other Day/30  Days Discharge Instructions: Apply as directed by provider. Primary Dressing: Hydrofera Blue Ready Transfer Foam, 2.5x2.5 (in/in) (Generic) Every Other Day/30 Days Discharge Instructions: Apply Hydrofera Blue Ready to wound bed as directed Secondary Dressing: (SILICONE BORDER) Zetuvit Plus SILICONE BORDER Dressing 4x4 (in/in) Every Other Day/30 Days Discharge Instructions: Please do not put silicone bordered dressings under wraps. Use non-bordered dressing only. Secured With: Tubigrip Size D, 3x10 (in/yd) Every Other Day/30 Days [name], [name] ([id number]) Wound #6 - Foot Wound Laterality: Left, Lateral Cleanser: Normal Saline Every Other Day/30 Days Discharge Instructions: Wash your hands with soap and water. Remove old dressing, discard into plastic bag and place into trash. Cleanse the wound with Normal Saline prior to applying a clean dressing using gauze sponges, not tissues or cotton balls. Do not scrub or use excessive force. Pat dry using gauze sponges, not tissue or cotton balls. Topical: Gentamicin Every Other Day/30 Days Discharge Instructions: Apply as directed by provider. Primary Dressing: Hydrofera Blue Ready Transfer Foam, 2.5x2.5 (in/in) Every Other Day/30 Days Discharge Instructions: Apply Hydrofera Blue Ready to wound bed as directed Secondary Dressing: (SILICONE BORDER) Zetuvit Plus SILICONE BORDER Dressing 4x4 (in/in) Every Other Day/30 Days Discharge Instructions: Please do not put silicone bordered dressings under wraps. Use non-bordered dressing only. Secured With: Tubigrip Size D, 3x10 (in/yd) Every Other Day/30 Days Wound #8 - Forearm Wound Laterality: Right Cleanser: Normal Saline 3 x Per Week/30 Days Discharge Instructions: Wash your hands with soap and water. Remove old dressing, discard into plastic bag and place into trash. Cleanse the wound with Normal Saline prior to applying a clean dressing using gauze sponges, not tissues or cotton balls. Do not scrub or  use excessive force. Pat dry using gauze sponges, not tissue or cotton balls. Cleanser: Soap and Water 3 x Per Week/30 Days Discharge Instructions: Gently cleanse wound with antibacterial soap, rinse and pat dry prior to dressing wounds Primary Dressing: Xeroform 5x9-HBD (in/in) 3 x Per Week/30 Days Discharge Instructions: Apply Xeroform 5x9-HBD (in/in) as directed Please do a double layer to keep area moist Primary Dressing: steri strips 3 x Per Week/30 Days Discharge Instructions: please do not remove will fall off on their own Secondary Dressing: Non-Adherent Pad 3x8 (in/in) 3 x Per Week/30 Days Secured With: [employer] Dressing, Latex-free, Size 5, Small-Head / Shoulder / Thigh 3 x Per Week/30 Days Discharge Instructions: size 2 Electronic Signature(s) Signed: 09/02/2021 4:46:59 PM By: Levora Dredge Signed: 09/03/2021 5:53:34 PM By: Worthy Keeler PA-C Entered By: Levora Dredge on 09/02/2021 13:43:30 [name], [name] ([id number]) -------------------------------------------------------------------------------- Problem List Details Patient Name: Achor, Amairany L. Date of Service: 09/02/2021 1:00 PM Medical Record Number: [id number] Patient Account Number: [id number] Date of Birth/Sex: 19-Feb-1926 (88 y.o. F) Treating RN: Levora Dredge Primary Care Provider: [name] Other Clinician: Referring Provider: [name] Treating Provider/Extender: Skipper Cliche in Treatment: 61 Active Problems ICD-10 Encounter Code Description Active Date MDM Diagnosis L89.893 Pressure ulcer of other site, stage 3 10/02/2020 No Yes S81.811A Laceration without foreign body, right lower leg, initial encounter 07/22/2021 No Yes I10 Essential (  primary) hypertension 06/26/2020 No Yes F01.50 Vascular dementia without behavioral disturbance 06/26/2020 No Yes I25.10 Atherosclerotic heart disease of native coronary artery without angina 06/26/2020 No Yes pectoris S51.811A Laceration without foreign  body of right forearm, initial encounter 09/02/2021 No Yes Inactive Problems Resolved Problems ICD-10 Code Description Active Date Resolved Date L89.623 Pressure ulcer of left heel, stage 3 06/26/2020 06/26/2020 Electronic Signature(s) Signed: 09/02/2021 1:55:28 PM By: Worthy Keeler PA-C Previous Signature: 09/02/2021 1:00:51 PM Version By: Worthy Keeler PA-C Entered By: Worthy Keeler on 09/02/2021 13:55:28 [name], [name] ([id number]) -------------------------------------------------------------------------------- Progress Note Details Patient Name: [name], [name] Date of Service: 09/02/2021 1:00 PM Medical Record Number: [id number] Patient Account Number: [id number] Date of Birth/Sex: [birth year] (88 y.o. F) Treating RN: Levora Dredge Primary Care Provider: [name] Other Clinician: Referring Provider: [name] Treating Provider/Extender: Skipper Cliche in Treatment: 61 Subjective Chief Complaint Information obtained from Patient Left LE Ulcers on leg and foot and skin tear right arm History of Present Illness (HPI) 88 year old patient was recently seen by the PCPs office for significant pain right great toe which has been going on since July. She was initially treated with Keflex which she did not complete and after the office visit this time she has been put on doxycycline. at the time of her visit she was found to have a ulcer on the plantar surface of the right great toe and also had a pyogenic granuloma over this area. X-ray of the right foot done 12/29/2014 -- IMPRESSION:Soft-tissue swelling and ulceration right great toe. No underlying bony lytic lesion identified. If osteomyelitis remains of clinical concern MRI can be obtained. Past medical history significant for anemia, chronic kidney disease stage III, obesity, varicose veins, coronary artery disease, gout, history of nicotine addiction given up smoking in 2002, hypertension, status post cardiac  catheterization, status post abdominal hysterectomy, cholecystectomy and tonsillectomy. hemoglobin A1c done in August was 5.8 01/22/2015 -- at this stage the [facility] Walking boat was going to cost them significant amount of money and they want to defer using that at the present time. 01/29/2015 -- she had a podiatry appointment and they have trimmed her toenails. She has not heard back from the vascular office regarding her venous duplex study and I have asked them to call personally so that they can get the appointment soon. 02/06/2015 -- they have made contact with the vascular office and from what I understand that test has been done but the report is pending. 02/19/2014 -- the vascular test is scheduled for tomorrow Readmission: 06/26/2020 upon evaluation today patient appears for initial evaluation in her clinic that she has been here before in 2016 and has been quite sometime. She did have a fractured hip in February on the 23rd 2022. Subsequently this had to be pinned and she ended up with a pressure injury on her heel following when she was using her foot to help move her around in the bed. Subsequently this has led to the wound that she has been dealing with since that time. She lives at home with her daughter currently she does have dementia. 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 07/03/2020 upon evaluation today patient appears to be doing well with regard to her heel ulcer. She did have arterial studies they appear to be doing excellent it was premature normal across the board with TBI's in the 90s and ABIs  well within normal range. Nonetheless there does not appear to be any signs of arterial insufficiency whatsoever. With that being said the patient does have also signs of improvement there is some necrotic tissue in the base of the wound number to try to  clear some of that away today I do believe the Iodoflex/Iodosorb is doing well. 5/24; difficult punched out wound on the left medial heel. She has been using Iodoflex 07/17/2020 upon evaluation today patient appears to be doing well at this point in regard to her wound. I do feel like this is a little bit deeper but again that is what is expected as we continue with the Iodoflex I think that this is just going to get deeper until we get to the base of the wound. With that being said I think we are getting much closer to the base of the wound where we can have healthier tissue that we get a be managing here which that will be awesome. In the meantime I am not surprised by what I am seeing and in fact the wound appears to be better as compared to previous findings. 07/24/2020 upon evaluation today patient appears to be doing well with regard to her wound. Overall I am extremely pleased with where things stand today. I do not see any signs of active infection which is great and overall I think that the patient is making good progress. There is good to be some need for sharp debridement today. 07/31/2020 upon evaluation today patient appears to be doing better in regard to her heel ulcer. She has been tolerating the dressing changes without complication. Fortunately there does not appear to be any signs of active infection which is great and overall very pleased in this regard. No fevers, chills, nausea, vomiting, or diarrhea. 08/14/2020 upon evaluation today patient appears to be doing better in regard to her heel ulcer. I am very pleased in that regard. Unfortunately she still has a slight deep tissue injury in regard to the medial portion of her foot over the same area. Unfortunately I think this is something that if were not careful it was can open up into the wound. That is my main concern here based on what I see. Fortunately there does not appear to be any evidence of active infection which is great  news and overall very pleased with where things stand at this point. 08/21/2020 upon evaluation today patient appears to be doing well with regard to her wound. She has been tolerating the dressing changes without complication. Fortunately there does not appear to be any signs of active infection at this time. No fevers, chills, nausea, vomiting, or diarrhea. I do believe the compression wrap was beneficial for her. 08/28/2020 upon evaluation today patient appears to be doing well with regard to her wounds currently. Fortunately there does not appear to be any signs of active infection at this time. No fevers, chills, nausea, vomiting, or diarrhea. With that being said I think that her leg is doing quite well to be honest. [name], [name] ([id number]) 09/04/2020 upon evaluation today patient appears to be doing well with regard to her wound on the heel. This is showing signs of good epithelial growth although there is probably can it be an indention where this heals that is okay as long as we get it closed. Fortunately I do not see any evidence of infection at this point. 09/13/2020 upon evaluation today patient appears to be doing well with regard to her wounds.  She has been tolerating the dressing changes without complication. Fortunately he is actually doing extremely well and I think she is making great progress this is measuring smaller. I would recommend that such that we continue with the [facility] likely since he is doing so well. 09/18/2020 upon evaluation today patient appears to be doing well with regard to her heel ulcer. She is making good progress and I am very pleased with what we see today. I think the Hydrofera Blue is still doing excellent. 10/02/2020 upon evaluation today patient's wound actually appears to be showing signs of good improvement in regard to the heel. I am very pleased in this regard. With that being said I do believe that the area which is somewhat been stable and  dry is starting to lift up and beginning to clear this away as well that is on the foot. Subsequently I am going to have to perform some debridement here and we did actually go ahead and allow this as a wound today as before it was just more deep tissue injury and eschar covering now I think it is a little bit more than that to be honest. 10/09/2020 upon evaluation today patient appears to be doing decently well in regard to her wounds. I am actually very pleased with where things stand today. There does not appear to be any signs of active infection which is great news. No fevers, chills, nausea, vomiting, or diarrhea. She is going require some sharp debridement today. 10/16/2020 upon evaluation today patient appears to be doing decently well in regard to her heel as well as the foot. Both are showing signs of good improvement which is great news. In general and extremely pleased with where things stand at this point. She is tolerating the [facility] well although this is getting so small in the heel I think collagen may be a better option here based on what I am seeing. With regard to the foot the Iodoflex/Iodosorb is still probably the best method here. 10/26/2020 upon evaluation today patient actually appears to be doing well in some respects today. She has been tolerating the dressing changes without complication and this is great news. The Hydrofera Blue is done well for the heel this actually appears to be healed today. With that being said in regard to the foot I think we are ready to switch to [facility] based on what I see at this point. 10/30/2020 upon evaluation today patient appears to be doing well with regard to his wound. He has been tolerating the dressing changes without complication. Fortunately there does not appear to be any signs of active infection systemically at this time which is great news and overall very pleased with where things stand today. I do think that she is  making progress although it somewhat slow still where showing signs of improvement little by little here. 11/06/2020 upon evaluation today patient appears to be doing decently well in regard to her wound. We will start and see better overall appearance of the base of the wound which is great news she still continues to have some abnormal fluorescence signals with a MolecuLight DX that will be detailed below. 11/20/2020 upon evaluation today patient's wound actually showing signs of improvement. There is good to be some need for sharp debridement today and that was discussed with the patient. I am going to go ahead and clean this up but I do believe the [facility] is doing a great job. 11/27/2020; the patient had  eschar over the original heel wound which I removed with a #3 curette there was nothing open here. The area is on the lateral left foot foot. 12/04/2020 upon evaluation today patient appears to be doing well with regard to her wound. The wound bed actually appeared to be doing well after I remove the dry endoform from the wound bed. This I tracked little bit of fluid but nonetheless underneath this appears to be doing awesome. I am very pleased with where things stand today. No fevers, chills, nausea, vomiting, or diarrhea. 12/11/2020 upon evaluation today patient appears to be doing well with regard to her wound although it keeps getting dry and filled up with the endoform I am not seeing any signs of infection but we are going to have to clean this away in order to try and get things moving in an appropriate direction. I discussed that with patient's daughter today as well. She is in agreement with proceeding as such. 12/21/2020 upon evaluation today patient appears to be doing well with regard to her wound this did not seal off like it was with the collagen but nonetheless also did not really feeling quite as much as I would like to have seen. I do believe that we may need to go ahead and  see about doing a debridement today and I really feel like we may want to switch back to the [facility] which I felt like was doing better in the past. Good news is the base of the wound appears healthy with good granulation tissue. 12/28/2020 upon evaluation patient's wound actually showed signs of doing quite well in regard to her foot. I think that we getting very close to complete resolution and overall I am extremely happy with where things stand today. 01/04/2021 upon evaluation today patient appears to be doing well with regard to her wound on the foot. This is good to require little bit of debridement today but overall seems to be doing quite well. I am actually very pleased with where we stand currently. 01/08/2021 upon evaluation today patient's wound actually showing signs of some improvement although she still has some callus buildup around the edges of the wound unfortunately I think this is continuing to rub despite what we are doing currently. I think that we may need to go ahead and see about switching up the dressing a little bit I Minna recommend collagen and then on top of the collagen we will get a use some foam double layered cut in a doughnut to try to take pressure off of this area we will see how this does over the next week. 01/15/2021 upon evaluation today patient's wound is actually showing signs of being completely dried over with the collagen. This is definitely not what we are looking for. I think that the wound dried out too quickly is the main issue that we are running into at this point and I really think we may need to do something else try to keep that from occurring. 01/22/2021 upon evaluation today patient's wound actually did stay open it did not callus over as its been doing in the past this is good news. Also feel like it may not be quite as deep as what we have previously seen. There is still not as much improvement he does what I would like to see  but nonetheless I think we are headed in the right direction. 01/29/2021 upon evaluation today patient appears to be doing okay in regard to her wound  is not too dry but unfortunately is too wet the Xeroform just did not do what I was hoping that she made things a little bit worse as far as size wise I am actually going to try at this point considering this is more open but not as deep the Hydrofera Blue to see if this will be beneficial at this time. Again we seem to be cycling through things and it is becoming a little difficult to get this to completely close but in the past Hydrofera Blue is what she did the best with 1 week to keep [name], [name] ([id number]) it from closing up prematurely. 02/05/2021 upon evaluation today patient's wound actually showing some signs of improvement which is great news. Fortunately I do not see any evidence of active infection locally nor systemically. It is definitely not too wet which is been the biggest issue we have seen up to this point over the past several weeks. 02/21/2021 upon evaluation today patient appears to be doing excellent in regard to her wound. This actually appears much improved compared to last time I saw her which is great news as well. No fevers, chills, nausea, vomiting, or diarrhea. 02/28/2021 upon evaluation today patient's wound on her foot actually appears to be doing quite well. I am very pleased with where things stand today. I do not see any evidence of active infection locally nor systemically at this time. No fevers, chills, nausea, vomiting, or diarrhea. 03/14/2021 upon evaluation today patient appears to be doing well with regard to her wound. There is a little bit of a limp on the plantar edge of the wound opening which I Minna see about trying to clear away today. Fortunately I think other than this everything seems to be showing signs of improvement measurements still are roughly the same. It is definitely not as deep  however. 03/28/2021 upon evaluation today patient appears to be doing well with regard to her wound. There is still some slough noted in the base of the wound. Fortunately there does not appear to be any evidence of active infection locally nor systemically at this time which is good news. 04/11/2021 upon evaluation today patient appears to be doing decently well in regard to her foot ulcer. This is showing a little bit of moisture buildup but fortunately nothing too significant I think the wound is looking better. This is just very slow to heal. Fortunately I do not see any evidence of active infection at this time. 04/18/2021 upon evaluation today patient appears to be doing well with regard to her ulcer. In fact this is getting very close to completely resolved. I am actually extremely pleased with where things stand today and how this appears. 05/02/2021 upon evaluation patient appears to be doing really about the same in regard to her wound I do not see any signs of significant worsening which is great news and overall very pleased in that regard. With that being said I do feel like that the patient is continuing to have really stalling of the wound she also has a couple areas that appear to be deep tissue injuries that are somewhat unfortunate to be perfectly honest. With that being said I do see evidence here in general of improvement which is good with regard to the depth of the wound we just need to try to protect her legs and hopefully her feet while allowing this last little bit to heal. Patient's daughter is doing an excellent job taking care of her and  I definitely do not see any issues in that regard at all she is very diligent in trying to keep pressure off as far as the feet are concerned. Nonetheless I do believe the patient still despite all of this is still getting several areas of deep tissue injury which raises the question as to why is this a blood flow issue or something else in the  past she really has done well and had no significant blood flow limitations with her ABIs of May 2020 to be excellent and normal. We will see how things progress and if we need to repeat this we will definitely do so. 05/09/2021 upon evaluation today patient unfortunately appears to not be doing quite as well as what I previously saw. In fact her feet seem to be showing signs of breakdown at several locations as well as the wound. In general she has not been eating or drinking well I am kind of concerned that she may be shutting down to some degree. 05-23-2021 on evaluation today patient appears to be doing well all things considered with regard to her wound. She does have a little bit of erythema around the medial foot ulcer none of the other deep tissue injuries have open which is good news. Fortunately I see no evidence of active infection locally nor systemically at this time which is also great news. No fevers, chills, nausea, vomiting, or diarrhea. 06-06-2021 upon evaluation today patient appears to be doing better in regard to her foot ulcers. Everything is actually showing signs of improvement which is great news. Fortunately I do not see any signs of active infection locally nor systemically which is great news. In fact her overall coloring of her foot seems better and she is also not having as much pain as she was. Her blood pressure is also more stable and had been dropping very low obviously I think that that is probably a big part of why things are looking better. 4/27; using Hydrofera Blue and 2 wounds on the left foot on the tip of the left great toe and on the left medial foot 5/8; the patient however has 3 wounds on the left foot. 1 at the tip of the left great toe, 1 medially on the left foot and another on the lateral fifth metatarsal head. Using Hydrofera Blue in all wounds. She does not complain of pain. 07-01-2021 upon evaluation today patient appears to be doing well with regard to  her wounds everything is appearing to do well no signs of infection in general and very pleased with where we stand today. There does not appear to be any signs of active infection locally or systemically which is great news. No fevers, chills, nausea, vomiting, or diarrhea. 07-22-2021 upon evaluation today patient appears to be doing well in general in regard to her wound she does have a new wound on the right shin this is due to a trauma that is a small skin tear. This happened in the past week. With that being said overall she seems to be doing quite well I do not see any evidence of a infection which is great news. No fevers, chills, nausea, vomiting, or diarrhea. 08-12-2021 upon evaluation today patient appears to be doing well with regard to her feet. Overall the wound on the right leg is healed the wound on the left foot area at all locations seem to be doing okay I do not see any signs of obvious active infection at this time which is  great news. 09-02-2021 upon evaluation today patient appears to be doing well currently in regard to her wounds on the legs specifically her feet. She has been tolerating the dressing changes without complication. Good news is she seems to be tolerating the dressing changes without any complication and is doing much better the only issue she has she does have a skin tear on the right forearm that actually happened on the way into the clinic today. [name], [name] ([id number]) Objective Constitutional Well-nourished and well-hydrated in no acute distress. Vitals Time Taken: 1:05 PM, Height: 58 in, Weight: 137 lbs, BMI: 28.6, Temperature: 98.5 F, Pulse: 76 bpm, Respiratory Rate: 18 breaths/min, Blood Pressure: 155/73 mmHg. Respiratory normal breathing without difficulty. Psychiatric this patient is able to make decisions and demonstrates good insight into disease process. Alert and Oriented x 3. pleasant and cooperative. General Notes: Upon inspection  patient's wounds are actually showing signs of excellent improvement at this time which is great news on the foot I am extremely pleased. With that being said the patient does have an issue here with a wound on the right forearm that is a skin tear that occurred actually when she was coming into the clinic today. I did have to perform Steri-Strips after reapproximating the edges of the wound and she tolerated that today without complication. Integumentary (Hair, Skin) Wound #3 status is Open. Original cause of wound was Pressure Injury. The date acquired was: 10/02/2020. The wound has been in treatment 47 weeks. The wound is located on the Left,Midline Foot. The wound measures 0.1cm length x 0.1cm width x 0.1cm depth; 0.008cm^2 area and 0.001cm^3 volume. There is Fat Layer (Subcutaneous Tissue) exposed. There is no tunneling or undermining noted. There is a medium amount of serosanguineous drainage noted. The wound margin is thickened. There is small (1-33%) pink granulation within the wound bed. There is a large (67-100%) amount of necrotic tissue within the wound bed including Adherent Slough. Wound #5 status is Healed - Epithelialized. Original cause of wound was Pressure Injury. The date acquired was: 06/03/2021. The wound has been in treatment 12 weeks. The wound is located on the Left Toe Great. The wound measures 0cm length x 0cm width x 0cm depth; 0cm^2 area and 0cm^3 volume. There is no tunneling or undermining noted. There is a medium amount of serosanguineous drainage noted. The wound margin is thickened. There is no granulation within the wound bed. There is no necrotic tissue within the wound bed. Wound #6 status is Open. Original cause of wound was Gradually Appeared. The date acquired was: 06/24/2021. The wound has been in treatment 10 weeks. The wound is located on the Left,Lateral Foot. The wound measures 0.2cm length x 0.2cm width x 0.1cm depth; 0.031cm^2 area and 0.003cm^3 volume.  There is Fat Layer (Subcutaneous Tissue) exposed. There is no tunneling or undermining noted. There is a medium amount of serosanguineous drainage noted. There is medium (34-66%) pink, pale granulation within the wound bed. There is a medium (34-66%) amount of necrotic tissue within the wound bed including Adherent Slough. Wound #8 status is Open. Original cause of wound was Trauma. The date acquired was: 09/02/2021. The wound is located on the Right Forearm. The wound measures 1.3cm length x 1.6cm width x 0.1cm depth; 1.634cm^2 area and 0.163cm^3 volume. There is no tunneling or undermining noted. There is a medium amount of serosanguineous drainage noted. There is large (67-100%) red granulation within the wound bed. There is no necrotic tissue within the wound bed. Assessment Active  Problems ICD-10 Pressure ulcer of other site, stage 3 Laceration without foreign body, right lower leg, initial encounter Essential (primary) hypertension Vascular dementia without behavioral disturbance Atherosclerotic heart disease of native coronary artery without angina pectoris Laceration without foreign body of right forearm, initial encounter Plan Follow-up Appointments: Return Appointment in 3 weeks. - due to holiday Bathing/ Shower/ Hygiene: May shower with wound dressing protected with water repellent cover or cast protector. No tub bath. Anesthetic (Use 'Patient Medications' Section for Anesthetic Order Entry): Lidocaine applied to wound bed Edema Control - Lymphedema / Segmental Compressive Device / Other: Sinatra, Eda L. ([id number]) Tubigrip single layer applied. - left leg size D Patient to wear own compression stockings. Remove compression stockings every night before going to bed and put on every morning when getting up. - right leg Elevate legs to the level of the heart and pump ankles as often as possible Elevate leg(s) parallel to the floor when sitting. DO YOUR BEST to sleep in the  bed at night. DO NOT sleep in your recliner. Long hours of sitting in a recliner leads to swelling of the legs and/or potential wounds on your backside. Non-Wound Condition: Additional non-wound orders/instructions: - may continue the Betadine to the pressure areas left foot Off-Loading: Other: - Offloading boots while in bed; Try pillow under sheet to keep it in place. Additional Orders / Instructions: Follow Nutritious Diet and Increase Protein Intake WOUND #3: - Foot Wound Laterality: Left, Midline Cleanser: Normal Saline Every Other Day/30 Days Discharge Instructions: Wash your hands with soap and water. Remove old dressing, discard into plastic bag and place into trash. Cleanse the wound with Normal Saline prior to applying a clean dressing using gauze sponges, not tissues or cotton balls. Do not scrub or use excessive force. Pat dry using gauze sponges, not tissue or cotton balls. Topical: Gentamicin Every Other Day/30 Days Discharge Instructions: Apply as directed by provider. Primary Dressing: Hydrofera Blue Ready Transfer Foam, 2.5x2.5 (in/in) (Generic) Every Other Day/30 Days Discharge Instructions: Apply Hydrofera Blue Ready to wound bed as directed Secondary Dressing: (SILICONE BORDER) Zetuvit Plus SILICONE BORDER Dressing 4x4 (in/in) Every Other Day/30 Days Discharge Instructions: Please do not put silicone bordered dressings under wraps. Use non-bordered dressing only. Secured With: Tubigrip Size D, 3x10 (in/yd) Every Other Day/30 Days WOUND #6: - Foot Wound Laterality: Left, Lateral Cleanser: Normal Saline Every Other Day/30 Days Discharge Instructions: Wash your hands with soap and water. Remove old dressing, discard into plastic bag and place into trash. Cleanse the wound with Normal Saline prior to applying a clean dressing using gauze sponges, not tissues or cotton balls. Do not scrub or use excessive force. Pat dry using gauze sponges, not tissue or cotton  balls. Topical: Gentamicin Every Other Day/30 Days Discharge Instructions: Apply as directed by provider. Primary Dressing: Hydrofera Blue Ready Transfer Foam, 2.5x2.5 (in/in) Every Other Day/30 Days Discharge Instructions: Apply Hydrofera Blue Ready to wound bed as directed Secondary Dressing: (SILICONE BORDER) Zetuvit Plus SILICONE BORDER Dressing 4x4 (in/in) Every Other Day/30 Days Discharge Instructions: Please do not put silicone bordered dressings under wraps. Use non-bordered dressing only. Secured With: Tubigrip Size D, 3x10 (in/yd) Every Other Day/30 Days WOUND #8: - Forearm Wound Laterality: Right Cleanser: Normal Saline 3 x Per Week/30 Days Discharge Instructions: Wash your hands with soap and water. Remove old dressing, discard into plastic bag and place into trash. Cleanse the wound with Normal Saline prior to applying a clean dressing using gauze sponges, not tissues or cotton balls. Do  not scrub or use excessive force. Pat dry using gauze sponges, not tissue or cotton balls. Cleanser: Soap and Water 3 x Per Week/30 Days Discharge Instructions: Gently cleanse wound with antibacterial soap, rinse and pat dry prior to dressing wounds Primary Dressing: Xeroform 5x9-HBD (in/in) 3 x Per Week/30 Days Discharge Instructions: Apply Xeroform 5x9-HBD (in/in) as directed Please do a double layer to keep area moist Primary Dressing: steri strips 3 x Per Week/30 Days Discharge Instructions: please do not remove will fall off on their own Secondary Dressing: Non-Adherent Pad 3x8 (in/in) 3 x Per Week/30 Days Secured With: [employer] Dressing, Latex-free, Size 5, Small-Head / Shoulder / Thigh 3 x Per Week/30 Days Discharge Instructions: size 2 1. I would recommend that we going continue with the wound care measures as before and the patient's daughter is in agreement with plan. This includes the use of the Xeroform gauze which I think has been doing a great job for skin tears I think this will  do well for her right arm. 2. With regard to the wound on her feet we will get a continue with the [facility] which I think is doing great we used a bordered foam dressings to cover. We will see patient back for reevaluation in 3 weeks here in the clinic. If anything worsens or changes patient will contact our office for additional recommendations. Electronic Signature(s) Signed: 09/02/2021 2:45:59 PM By: Worthy Keeler PA-C Entered By: Worthy Keeler on 09/02/2021 14:45:58 Rumer, Freddie L. ([id number]) -------------------------------------------------------------------------------- SuperBill Details Patient Name: Fromme, Ariyon L. Date of Service: 09/02/2021 Medical Record Number: [id number] Patient Account Number: [id number] Date of Birth/Sex: [birth year] (88 y.o. F) Treating RN: Levora Dredge Primary Care Provider: [name] Other Clinician: Referring Provider: [name] Treating Provider/Extender: Skipper Cliche in Treatment: 61 Diagnosis Coding ICD-10 Codes Code Description [phone] Pressure ulcer of other site, stage 3 S81.811A Laceration without foreign body, right lower leg, initial encounter Foss (primary) hypertension I25.10 Atherosclerotic heart disease of native coronary artery without angina pectoris Facility Procedures CPT4 Code: [id number] Description: 99214 - WOUND CARE VISIT-LEV 4 EST PT Modifier: Quantity: 1 Physician Procedures CPT4 Code: [id number] Description: 99214 - WC PHYS LEVEL 4 - EST PT Modifier: Quantity: 1 CPT4 Code: Description: ICD-10 Diagnosis Description L89.893 Pressure ulcer of other site, stage 3 S81.811A Laceration without foreign body, right lower leg, initial encounter I10 Essential (primary) hypertension I25.10 Atherosclerotic heart disease of native  coronary artery without angina Modifier: pectoris Quantity: Electronic Signature(s) Signed: 09/02/2021 2:46:31 PM By: Worthy Keeler PA-C Previous Signature:  09/02/2021 2:46:18 PM Version By: Worthy Keeler PA-C Entered By: Worthy Keeler on 09/02/2021 14:46:30

## 2021-09-02 NOTE — Progress Notes (Addendum)
Celani, Nautica L. ([id number]) Visit Report for 09/02/2021 Arrival Information Details Patient Name: [name] [name], [name] Date of Service: 09/02/2021 1:00 PM Medical Record Number: [id number] Patient Account Number: [id number] Date of Birth/Sex: November 01, 1926 (88 y.o. F) Treating RN: Levora Dredge Primary Care Eilam Shrewsbury: [name] Other Clinician: Referring Dazaria Macneill: [name] Treating Jermel Artley/Extender: Skipper Cliche in Treatment: 69 Visit Information History Since Last Visit Added or deleted any medications: No Patient Arrived: Wheel Chair Any new allergies or adverse reactions: No Arrival Time: 13:09 Had a fall or experienced change in No Accompanied By: daughter activities of daily living that may affect Transfer Assistance: Manual risk of falls: Patient Identification Verified: Yes Hospitalized since last visit: No Secondary Verification Process Completed: Yes Has Dressing in Place as Prescribed: Yes Patient Requires Transmission-Based No Pain Present Now: No Precautions: Patient Has Alerts: Yes Patient Alerts: NOT diabetic ABI R1.18 L1.25 06/27/20 Electronic Signature(s) Signed: 09/02/2021 4:46:59 PM By: Levora Dredge Entered By: Levora Dredge on 09/02/2021 13:09:28 Amory, Nioka L. ([id number]) -------------------------------------------------------------------------------- Clinic Level of Care Assessment Details Patient Name: [name], [name] Date of Service: 09/02/2021 1:00 PM Medical Record Number: [id number] Patient Account Number: [id number] Date of Birth/Sex: 03/12/1926 (88 y.o. F) Treating RN: Levora Dredge Primary Care Breandan People: [name] Other Clinician: Referring Muaaz Brau: [name] Treating Caila Cirelli/Extender: Skipper Cliche in Treatment: 61 Clinic Level of Care Assessment Items TOOL 4 Quantity Score []  - Use when only an EandM is performed on FOLLOW-UP visit 0 ASSESSMENTS - Nursing Assessment / Reassessment X - Reassessment  of Co-morbidities (includes updates in patient status) 1 10 X- 1 5 Reassessment of Adherence to Treatment Plan ASSESSMENTS - Wound and Skin Assessment / Reassessment []  - Simple Wound Assessment / Reassessment - one wound 0 X- 3 5 Complex Wound Assessment / Reassessment - multiple wounds []  - 0 Dermatologic / Skin Assessment (not related to wound area) ASSESSMENTS - Focused Assessment []  - Circumferential Edema Measurements - multi extremities 0 []  - 0 Nutritional Assessment / Counseling / Intervention []  - 0 Lower Extremity Assessment (monofilament, tuning fork, pulses) []  - 0 Peripheral Arterial Disease Assessment (using hand held doppler) ASSESSMENTS - Ostomy and/or Continence Assessment and Care []  - Incontinence Assessment and Management 0 []  - 0 Ostomy Care Assessment and Management (repouching, etc.) PROCESS - Coordination of Care X - Simple Patient / Family Education for ongoing care 1 15 []  - 0 Complex (extensive) Patient / Family Education for ongoing care []  - 0 Staff obtains Programmer, systems, Records, Test Results / Process Orders []  - 0 Staff telephones HHA, Nursing Homes / Clarify orders / etc []  - 0 Routine Transfer to another Facility (non-emergent condition) []  - 0 Routine Hospital Admission (non-emergent condition) []  - 0 New Admissions / Biomedical engineer / Ordering NPWT, Apligraf, etc. []  - 0 Emergency Hospital Admission (emergent condition) X- 1 10 Simple Discharge Coordination []  - 0 Complex (extensive) Discharge Coordination PROCESS - Special Needs []  - Pediatric / Minor Patient Management 0 []  - 0 Isolation Patient Management []  - 0 Hearing / Language / Visual special needs []  - 0 Assessment of Community assistance (transportation, D/C planning, etc.) []  - 0 Additional assistance / Altered mentation []  - 0 Support Surface(s) Assessment (bed, cushion, seat, etc.) INTERVENTIONS - Wound Cleansing / Measurement Mapes, Herman L.  ([id number]) []  - 0 Simple Wound Cleansing - one wound X- 3 5 Complex Wound Cleansing - multiple wounds X- 1 5 Wound Imaging (photographs - any number of wounds) []  - 0 Wound Tracing (instead of photographs) []  -  0 Simple Wound Measurement - one wound X- 3 5 Complex Wound Measurement - multiple wounds INTERVENTIONS - Wound Dressings X - Small Wound Dressing one or multiple wounds 3 10 []  - 0 Medium Wound Dressing one or multiple wounds []  - 0 Large Wound Dressing one or multiple wounds X- 1 5 Application of Medications - topical []  - 0 Application of Medications - injection INTERVENTIONS - Miscellaneous []  - External ear exam 0 []  - 0 Specimen Collection (cultures, biopsies, blood, body fluids, etc.) []  - 0 Specimen(s) / Culture(s) sent or taken to Lab for analysis []  - 0 Patient Transfer (multiple staff / Civil Service fast streamer / Similar devices) []  - 0 Simple Staple / Suture removal (25 or less) []  - 0 Complex Staple / Suture removal (26 or more) []  - 0 Hypo / Hyperglycemic Management (close monitor of Blood Glucose) []  - 0 Ankle / Brachial Index (ABI) - do not check if billed separately X- 1 5 Vital Signs Has the patient been seen at the hospital within the last three years: Yes Total Score: 130 Level Of Care: New/Established - Level 4 Electronic Signature(s) Signed: 09/02/2021 4:46:59 PM By: Levora Dredge Entered By: Levora Dredge on 09/02/2021 13:44:07 Brunner, Sanela L. ([id number]) -------------------------------------------------------------------------------- Encounter Discharge Information Details Patient Name: [name], [name] Date of Service: 09/02/2021 1:00 PM Medical Record Number: [id number] Patient Account Number: [id number] Date of Birth/Sex: Sep 19, 1926 (88 y.o. F) Treating RN: Levora Dredge Primary Care Peggyann Zwiefelhofer: [name] Other Clinician: Referring Cinda Hara: [name] Treating Kanae Ignatowski/Extender: Skipper Cliche in Treatment:  7 Encounter Discharge Information Items Discharge Condition: Stable Ambulatory Status: Wheelchair Discharge Destination: Home Transportation: Private Auto Accompanied By: daughter Schedule Follow-up Appointment: Yes Clinical Summary of Care: Electronic Signature(s) Signed: 09/02/2021 4:46:59 PM By: Levora Dredge Entered By: Levora Dredge on 09/02/2021 13:45:03 Winkels, Natsuko L. ([id number]) -------------------------------------------------------------------------------- Lower Extremity Assessment Details Patient Name: Steffey, Lavergne L. Date of Service: 09/02/2021 1:00 PM Medical Record Number: [id number] Patient Account Number: [id number] Date of Birth/Sex: [birth year] (88 y.o. F) Treating RN: Levora Dredge Primary Care Peytan Andringa: [name] Other Clinician: Referring Olla Delancey: [name] Treating Leo Weyandt/Extender: Jeri Cos Weeks in Treatment: 61 Edema Assessment Assessed: [Left: No] [Right: No] Edema: [Left: N] [Right: o] Vascular Assessment Pulses: Dorsalis Pedis Palpable: [Left:Yes] [Right:Yes] Electronic Signature(s) Signed: 09/02/2021 4:46:59 PM By: Levora Dredge Entered By: Levora Dredge on 09/02/2021 13:16:43 Lennartz, Philana L. ([id number]) -------------------------------------------------------------------------------- Multi Wound Chart Details Patient Name: Linse, Rossi L. Date of Service: 09/02/2021 1:00 PM Medical Record Number: [id number] Patient Account Number: [id number] Date of Birth/Sex: [birth year] (88 y.o. F) Treating RN: Levora Dredge Primary Care Fryda Molenda: [name] Other Clinician: Referring Shaliyah Taite: [name] Treating Marieli Rudy/Extender: Skipper Cliche in Treatment: 32 Vital Signs Height(in): 39 Pulse(bpm): 63 Weight(lbs): 137 Blood Pressure(mmHg): 155/73 Body Mass Index(BMI): 28.6 Temperature(F): 98.5 Respiratory Rate(breaths/min): 18 Photos: Wound Location: Left, Midline Foot Left Toe Great Left, Lateral  Foot Wounding Event: Pressure Injury Pressure Injury Gradually Appeared Primary Etiology: Pressure Ulcer Pressure Ulcer Pressure Ulcer Comorbid History: Cataracts, Anemia, Coronary Artery Cataracts, Anemia, Coronary Artery Cataracts, Anemia, Coronary Artery Disease, Hypertension, Myocardial Disease, Hypertension, Myocardial Disease, Hypertension, Myocardial Infarction, End Stage Renal Infarction, End Stage Renal Infarction, End Stage Renal Disease, Gout, Osteoarthritis, Disease, Gout, Osteoarthritis, Disease, Gout, Osteoarthritis, Dementia Dementia Dementia Date Acquired: 10/02/2020 06/03/2021 06/24/2021 Weeks of Treatment: 47 12 10 Wound Status: Open Open Open Wound Recurrence: No No No Measurements L x W x D (cm) 0.1x0.1x0.1 0.1x0.1x0.1 0.2x0.2x0.1 Area (cm) : 0.008 0.008 0.031 Volume (cm) : 0.001 0.001 0.003 %  Reduction in Area: 97.50% 99.40% 50.80% % Reduction in Volume: 96.80% 99.20% 50.00% Classification: Category/Stage III Category/Stage II Category/Stage II Exudate Amount: Medium Medium Medium Exudate Type: Serosanguineous Serosanguineous Serosanguineous Exudate Color: red, brown red, brown red, brown Wound Margin: Thickened Thickened N/A Granulation Amount: Small (1-33%) None Present (0%) Medium (34-66%) Granulation Quality: Pink N/A Pink, Pale Necrotic Amount: Large (67-100%) None Present (0%) Medium (34-66%) Exposed Structures: Fat Layer (Subcutaneous Tissue): Fascia: No Fat Layer (Subcutaneous Tissue): Yes Fat Layer (Subcutaneous Tissue): Yes Fascia: No No Tendon: No Tendon: No Muscle: No Muscle: No Joint: No Joint: No Bone: No Bone: No Epithelialization: Medium (34-66%) Large (67-100%) Small (1-33%) Treatment Notes Electronic Signature(s) Signed: 09/02/2021 4:46:59 PM By: Levora Dredge Entered By: Levora Dredge on 09/02/2021 13:22:36 Carmickle, Wandra L. ([id number]) Sovine, Sharla L.  ([id number]) -------------------------------------------------------------------------------- [place] Details Patient Name: Lok, Amilee L. Date of Service: 09/02/2021 1:00 PM Medical Record Number: [id number] Patient Account Number: [id number] Date of Birth/Sex: [birth year] (88 y.o. F) Treating RN: Levora Dredge Primary Care Deaisha Welborn: [name] Other Clinician: Referring Delawrence Fridman: [name] Treating Jahara Dail/Extender: Skipper Cliche in Treatment: 53 Active Inactive Electronic Signature(s) Signed: 09/02/2021 4:46:59 PM By: Levora Dredge Entered By: Levora Dredge on 09/02/2021 13:22:26 Glanzer, Zena L. ([id number]) -------------------------------------------------------------------------------- Pain Assessment Details Patient Name: Burgeson, Lilja L. Date of Service: 09/02/2021 1:00 PM Medical Record Number: [id number] Patient Account Number: [id number] Date of Birth/Sex: [birth year] (88 y.o. F) Treating RN: Levora Dredge Primary Care Issa Kosmicki: [name] Other Clinician: Referring Heliodoro Domagalski: [name] Treating Reda Citron/Extender: Skipper Cliche in Treatment: 61 Active Problems Location of Pain Severity and Description of Pain Patient Has Paino No Site Locations Rate the pain. Current Pain Level: 0 Pain Management and Medication Current Pain Management: Electronic Signature(s) Signed: 09/02/2021 4:46:59 PM By: Levora Dredge Entered By: Levora Dredge on 09/02/2021 13:09:54 Juma, Ayari L. ([id number]) -------------------------------------------------------------------------------- Patient/Caregiver Education Details Patient Name: Degner, Valine L. Date of Service: 09/02/2021 1:00 PM Medical Record Number: [id number] Patient Account Number: [id number] Date of Birth/Gender: [birth year] (88 y.o. F) Treating RN: Levora Dredge Primary Care Physician: [name] Other Clinician: Referring Physician: [name] Treating  Physician/Extender: Skipper Cliche in Treatment: 41 Education Assessment Education Provided To: Caregiver Education Topics Provided Wound/Skin Impairment: Handouts: Caring for Your Ulcer Methods: Explain/Verbal Responses: State content correctly Electronic Signature(s) Signed: 09/02/2021 4:46:59 PM By: Levora Dredge Entered By: Levora Dredge on 09/02/2021 13:44:33 Kollmann, [place] ([id number]) -------------------------------------------------------------------------------- Wound Assessment Details Patient Name: Forni, Destine L. Date of Service: 09/02/2021 1:00 PM Medical Record Number: [id number] Patient Account Number: [id number] Date of Birth/Sex: 1926-11-28 (88 y.o. F) Treating RN: Levora Dredge Primary Care Rohith Fauth: [name] Other Clinician: Referring Kaelyn Innocent: [name] Treating Yui Mulvaney/Extender: Skipper Cliche in Treatment: 61 Wound Status Wound Number: 3 Primary Pressure Ulcer Etiology: Wound Location: Left, Midline Foot Wound Open Wounding Event: Pressure Injury Status: Date Acquired: 10/02/2020 Comorbid Cataracts, Anemia, Coronary Artery Disease, Weeks Of Treatment: 47 History: Hypertension, Myocardial Infarction, End Stage Renal Clustered Wound: No Disease, Gout, Osteoarthritis, Dementia Photos Wound Measurements Length: (cm) 0.1 Width: (cm) 0.1 Depth: (cm) 0.1 Area: (cm) 0.008 Volume: (cm) 0.001 % Reduction in Area: 97.5% % Reduction in Volume: 96.8% Epithelialization: Medium (34-66%) Tunneling: No Undermining: No Wound Description Classification: Category/Stage III Wound Margin: Thickened Exudate Amount: Medium Exudate Type: Serosanguineous Exudate Color: red, brown Foul Odor After Cleansing: No Slough/Fibrino Yes Wound Bed Granulation Amount: Small (1-33%) Exposed Structure Granulation Quality: Pink Fascia Exposed: No Necrotic Amount: Large (67-100%) Fat Layer (Subcutaneous Tissue) Exposed: Yes Necrotic Quality:  Adherent Slough Tendon Exposed: No Muscle Exposed: No Joint Exposed: No Bone Exposed: No Treatment Notes Wound #3 (Foot) Wound Laterality: Left, Midline Cleanser Normal Saline Discharge Instruction: Wash your hands with soap and water. Remove old dressing, discard into plastic bag and place into trash. Cleanse the wound with Normal Saline prior to applying a clean dressing using gauze sponges, not tissues or cotton balls. Do not Dunsmore, Kamile L. ([id number]) scrub or use excessive force. Pat dry using gauze sponges, not tissue or cotton balls. Peri-Wound Care Topical Gentamicin Discharge Instruction: Apply as directed by Maude Hettich. Primary Dressing Hydrofera Blue Ready Transfer Foam, 2.5x2.5 (in/in) Discharge Instruction: Apply Hydrofera Blue Ready to wound bed as directed Secondary Dressing (SILICONE BORDER) Zetuvit Plus SILICONE BORDER Dressing 4x4 (in/in) Discharge Instruction: Please do not put silicone bordered dressings under wraps. Use non-bordered dressing only. Secured With Tubigrip Size D, 3x10 (in/yd) Compression Wrap Compression Stockings Add-Ons Electronic Signature(s) Signed: 09/02/2021 4:46:59 PM By: Levora Dredge Entered By: Levora Dredge on 09/02/2021 13:15:22 Sayas, Darshana L. ([id number]) -------------------------------------------------------------------------------- Wound Assessment Details Patient Name: Driscoll, Rilynne L. Date of Service: 09/02/2021 1:00 PM Medical Record Number: [id number] Patient Account Number: [id number] Date of Birth/Sex: 02-25-1926 (88 y.o. F) Treating RN: Levora Dredge Primary Care Elide Stalzer: [name] Other Clinician: Referring Wrangler Penning: [name] Treating Javan Gonzaga/Extender: Skipper Cliche in Treatment: 61 Wound Status Wound Number: 5 Primary Pressure Ulcer Etiology: Wound Location: Left Toe Great Wound Healed - Epithelialized Wounding Event: Pressure Injury Status: Date Acquired: 06/03/2021 Comorbid  Cataracts, Anemia, Coronary Artery Disease, Weeks Of Treatment: 12 History: Hypertension, Myocardial Infarction, End Stage Renal Clustered Wound: No Disease, Gout, Osteoarthritis, Dementia Photos Wound Measurements Length: (cm) 0 % Re Width: (cm) 0 % Re Depth: (cm) 0 Epit Area: (cm) 0 Tun Volume: (cm) 0 Und duction in Area: 100% duction in Volume: 100% helialization: Large (67-100%) neling: No ermining: No Wound Description Classification: Category/Stage II Fou Wound Margin: Thickened Slo Exudate Amount: Medium Exudate Type: Serosanguineous Exudate Color: red, brown l Odor After Cleansing: No ugh/Fibrino No Wound Bed Granulation Amount: None Present (0%) Exposed Structure Necrotic Amount: None Present (0%) Fascia Exposed: No Fat Layer (Subcutaneous Tissue) Exposed: No Tendon Exposed: No Muscle Exposed: No Joint Exposed: No Bone Exposed: No Treatment Notes Wound #5 (Toe Great) Wound Laterality: Left Cleanser Peri-Wound Care Topical Louthan, Kortnie L. ([id number]) Primary Dressing Secondary Dressing Secured With Compression Wrap Compression Stockings Add-Ons Electronic Signature(s) Signed: 09/02/2021 4:46:59 PM By: Levora Dredge Entered By: Levora Dredge on 09/02/2021 13:23:51 Weare, Jeaneen L. ([id number]) -------------------------------------------------------------------------------- Wound Assessment Details Patient Name: Nobbe, Dashanti L. Date of Service: 09/02/2021 1:00 PM Medical Record Number: [id number] Patient Account Number: [id number] Date of Birth/Sex: Mar 09, 1926 (88 y.o. F) Treating RN: Levora Dredge Primary Care Erique Kaser: [name] Other Clinician: Referring Jahkai Yandell: [name] Treating Elbony Mcclimans/Extender: Skipper Cliche in Treatment: 61 Wound Status Wound Number: 6 Primary Pressure Ulcer Etiology: Wound Location: Left, Lateral Foot Wound Open Wounding Event: Gradually Appeared Status: Date Acquired: 06/24/2021 Comorbid  Cataracts, Anemia, Coronary Artery Disease, Weeks Of Treatment: 10 History: Hypertension, Myocardial Infarction, End Stage Renal Clustered Wound: No Disease, Gout, Osteoarthritis, Dementia Photos Wound Measurements Length: (cm) 0.2 Width: (cm) 0.2 Depth: (cm) 0.1 Area: (cm) 0.031 Volume: (cm) 0.003 % Reduction in Area: 50.8% % Reduction in Volume: 50% Epithelialization: Small (1-33%) Tunneling: No Undermining: No Wound Description Classification: Category/Stage II Exudate Amount: Medium Exudate Type: Serosanguineous Exudate Color: red, brown Foul Odor After Cleansing: No Slough/Fibrino Yes Wound Bed Granulation Amount: Medium (34-66%) Exposed Structure Granulation Quality: Pink,  Pale Fat Layer (Subcutaneous Tissue) Exposed: Yes Necrotic Amount: Medium (34-66%) Necrotic Quality: Adherent Slough Treatment Notes Wound #6 (Foot) Wound Laterality: Left, Lateral Cleanser Normal Saline Discharge Instruction: Wash your hands with soap and water. Remove old dressing, discard into plastic bag and place into trash. Cleanse the wound with Normal Saline prior to applying a clean dressing using gauze sponges, not tissues or cotton balls. Do not scrub or use excessive force. Pat dry using gauze sponges, not tissue or cotton balls. Peri-Wound Care Topical Gasner, Donnia L. ([id number]) Gentamicin Discharge Instruction: Apply as directed by Creedence Kunesh. Primary Dressing Hydrofera Blue Ready Transfer Foam, 2.5x2.5 (in/in) Discharge Instruction: Apply Hydrofera Blue Ready to wound bed as directed Secondary Dressing (SILICONE BORDER) Zetuvit Plus SILICONE BORDER Dressing 4x4 (in/in) Discharge Instruction: Please do not put silicone bordered dressings under wraps. Use non-bordered dressing only. Secured With Tubigrip Size D, 3x10 (in/yd) Compression Wrap Compression Stockings Add-Ons Electronic Signature(s) Signed: 09/02/2021 4:46:59 PM By: Levora Dredge Entered By: Levora Dredge  on 09/02/2021 13:16:19 Bagdasarian, Zuma L. ([id number]) -------------------------------------------------------------------------------- Wound Assessment Details Patient Name: Westman, Genice L. Date of Service: 09/02/2021 1:00 PM Medical Record Number: [id number] Patient Account Number: [id number] Date of Birth/Sex: [birth year] (88 y.o. F) Treating RN: Levora Dredge Primary Care Leisel Pinette: [name] Other Clinician: Referring Kroy Sprung: [name] Treating Tung Pustejovsky/Extender: Skipper Cliche in Treatment: 61 Wound Status Wound Number: 8 Primary Trauma, Other Etiology: Wound Location: Right Forearm Wound Open Wounding Event: Trauma Status: Date Acquired: 09/02/2021 Comorbid Cataracts, Anemia, Coronary Artery Disease, Weeks Of Treatment: 0 History: Hypertension, Myocardial Infarction, End Stage Renal Clustered Wound: No Disease, Gout, Osteoarthritis, Dementia Photos Wound Measurements Length: (cm) 1.3 Width: (cm) 1.6 Depth: (cm) 0.1 Area: (cm) 1.634 Volume: (cm) 0.163 % Reduction in Area: % Reduction in Volume: Epithelialization: None Tunneling: No Undermining: No Wound Description Classification: Full Thickness Without Exposed Support Structures Exudate Amount: Medium Exudate Type: Serosanguineous Exudate Color: red, brown Foul Odor After Cleansing: No Slough/Fibrino No Wound Bed Granulation Amount: Large (67-100%) Granulation Quality: Red Necrotic Amount: None Present (0%) Treatment Notes Wound #8 (Forearm) Wound Laterality: Right Cleanser Normal Saline Discharge Instruction: Wash your hands with soap and water. Remove old dressing, discard into plastic bag and place into trash. Cleanse the wound with Normal Saline prior to applying a clean dressing using gauze sponges, not tissues or cotton balls. Do not scrub or use excessive force. Pat dry using gauze sponges, not tissue or cotton balls. Soap and Water Discharge Instruction: Gently cleanse wound with  antibacterial soap, rinse and pat dry prior to dressing wounds Peri-Wound Care Oshiro, Kimiko L. ([id number]) Topical Primary Dressing Xeroform 5x9-HBD (in/in) Discharge Instruction: Apply Xeroform 5x9-HBD (in/in) as directed Please do a double layer to keep area moist steri strips Discharge Instruction: please do not remove will fall off on their own Secondary Dressing Non-Adherent Pad 3x8 (in/in) Secured With [employer] Dressing, Latex-free, Size 5, Small-Head / Shoulder / Thigh Discharge Instruction: size 2 Compression Wrap Compression Stockings Add-Ons Electronic Signature(s) Signed: 09/02/2021 4:46:59 PM By: Levora Dredge Entered By: Levora Dredge on 09/02/2021 13:40:53 Spiers, Cyriah L. ([id number]) -------------------------------------------------------------------------------- Vitals Details Patient Name: Sheley, Rekia L. Date of Service: 09/02/2021 1:00 PM Medical Record Number: [id number] Patient Account Number: [id number] Date of Birth/Sex: Aug 27, 1926 (88 y.o. F) Treating RN: Levora Dredge Primary Care Thaine Garriga: [name] Other Clinician: Referring Donnivan Villena: [name] Treating Zerenity Bowron/Extender: Skipper Cliche in Treatment: 61 Vital Signs Time Taken: 13:05 Temperature (F): 98.5 Height (in): 58 Pulse (bpm): 76 Weight (lbs): 137 Respiratory Rate (breaths/min):  18 Body Mass Index (BMI): 28.6 Blood Pressure (mmHg): 155/73 Reference Range: 80 - 120 mg / dl Electronic Signature(s) Signed: 09/02/2021 4:46:59 PM By: Levora Dredge Entered By: Levora Dredge on 09/02/2021 13:09:46

## 2021-09-08 DIAGNOSIS — F0150 Vascular dementia without behavioral disturbance: Secondary | ICD-10-CM | POA: Diagnosis not present

## 2021-09-16 ENCOUNTER — Other Ambulatory Visit: Payer: Self-pay | Admitting: Family Medicine

## 2021-09-16 DIAGNOSIS — E039 Hypothyroidism, unspecified: Secondary | ICD-10-CM

## 2021-09-23 ENCOUNTER — Encounter: Payer: PPO | Attending: Physician Assistant | Admitting: Physician Assistant

## 2021-09-23 DIAGNOSIS — Z87891 Personal history of nicotine dependence: Secondary | ICD-10-CM | POA: Insufficient documentation

## 2021-09-23 DIAGNOSIS — E669 Obesity, unspecified: Secondary | ICD-10-CM | POA: Diagnosis not present

## 2021-09-23 DIAGNOSIS — F0150 Vascular dementia without behavioral disturbance: Secondary | ICD-10-CM | POA: Insufficient documentation

## 2021-09-23 DIAGNOSIS — Z6828 Body mass index (BMI) 28.0-28.9, adult: Secondary | ICD-10-CM | POA: Insufficient documentation

## 2021-09-23 DIAGNOSIS — Z9049 Acquired absence of other specified parts of digestive tract: Secondary | ICD-10-CM | POA: Insufficient documentation

## 2021-09-23 DIAGNOSIS — L89893 Pressure ulcer of other site, stage 3: Secondary | ICD-10-CM | POA: Insufficient documentation

## 2021-09-23 DIAGNOSIS — I2510 Atherosclerotic heart disease of native coronary artery without angina pectoris: Secondary | ICD-10-CM | POA: Diagnosis not present

## 2021-09-23 DIAGNOSIS — N183 Chronic kidney disease, stage 3 unspecified: Secondary | ICD-10-CM | POA: Insufficient documentation

## 2021-09-23 DIAGNOSIS — L98498 Non-pressure chronic ulcer of skin of other sites with other specified severity: Secondary | ICD-10-CM | POA: Diagnosis not present

## 2021-09-23 DIAGNOSIS — S51811A Laceration without foreign body of right forearm, initial encounter: Secondary | ICD-10-CM | POA: Diagnosis not present

## 2021-09-23 DIAGNOSIS — X58XXXA Exposure to other specified factors, initial encounter: Secondary | ICD-10-CM | POA: Insufficient documentation

## 2021-09-23 DIAGNOSIS — Z90710 Acquired absence of both cervix and uterus: Secondary | ICD-10-CM | POA: Diagnosis not present

## 2021-09-23 DIAGNOSIS — S81811A Laceration without foreign body, right lower leg, initial encounter: Secondary | ICD-10-CM | POA: Diagnosis not present

## 2021-09-23 DIAGNOSIS — I129 Hypertensive chronic kidney disease with stage 1 through stage 4 chronic kidney disease, or unspecified chronic kidney disease: Secondary | ICD-10-CM | POA: Diagnosis not present

## 2021-09-23 DIAGNOSIS — L97522 Non-pressure chronic ulcer of other part of left foot with fat layer exposed: Secondary | ICD-10-CM | POA: Diagnosis not present

## 2021-09-23 NOTE — Progress Notes (Addendum)
[name], [name] ([id number]) Visit Report for 09/23/2021 Chief Complaint Document Details Patient Name: [name], [name] Date of Service: 09/23/2021 1:00 PM Medical Record Number: [id number] Patient Account Number: [id number] Date of Birth/Sex: 04/25/1926 (88 y.o. F) Treating RN: [name] [name] Primary Care Provider: Lelon [name] Other Clinician: Massie [name] Referring Provider: Lelon [name] Treating Provider/Extender: [name] [name] in Treatment: 64 Information Obtained from: Patient Chief Complaint Left LE Ulcers on leg and foot and skin tear right arm Electronic Signature(s) Signed: 09/23/2021 1:26:17 PM By: [name] Keeler PA-C Entered By: [name] [name] on 09/23/2021 13:26:16 [name], [name] ([id number]) -------------------------------------------------------------------------------- HPI Details Patient Name: [name], [name] Date of Service: 09/23/2021 1:00 PM Medical Record Number: [id number] Patient Account Number: [id number] Date of Birth/Sex: 1926-09-05 (88 y.o. F) Treating RN: [name] [name] Primary Care Provider: Lelon [name] Other Clinician: Massie [name] Referring Provider: Lelon [name] Treating Provider/Extender: [name] [name] in Treatment: 33 History of Present Illness HPI Description: 88 year old patient was recently seen by the PCPs office for significant pain right great toe which has been going on since July. She was initially treated with Keflex which she did not complete and after the office visit this time she has been put on doxycycline. at the time of her visit she was found to have a ulcer on the plantar surface of the right great toe and also had a pyogenic granuloma over this area. X-ray of the right foot done 12/29/2014 -- IMPRESSION:Soft-tissue swelling and ulceration right great toe. No underlying bony lytic lesion identified. If osteomyelitis remains of clinical concern MRI can be obtained. Past medical history significant for anemia,  chronic kidney disease stage III, obesity, varicose veins, coronary artery disease, gout, history of nicotine addiction given up smoking in 2002, hypertension, status post cardiac catheterization, status post abdominal hysterectomy, cholecystectomy and tonsillectomy. hemoglobin A1c done in August was 5.8 01/22/2015 -- at this stage the [facility] Walking boat was going to cost them significant amount of money and they want to defer using that at the present time. 01/29/2015 -- she had a podiatry appointment and they have trimmed her toenails. She has not heard back from the vascular office regarding her venous duplex study and I have asked them to call personally so that they can get the appointment soon. 02/06/2015 -- they have made contact with the vascular office and from what I understand that test has been done but the report is pending. 02/19/2014 -- the vascular test is scheduled for tomorrow Readmission: 06/26/2020 upon evaluation today patient appears for initial evaluation in her clinic that she has been here before in 2016 and has been quite sometime. She did have a fractured hip in February on the 23rd 2022. Subsequently this had to be pinned and she ended up with a pressure injury on her heel following when she was using her foot to help move her around in the bed. Subsequently this has led to the wound that she has been dealing with since that time. She lives at home with her daughter currently she does have dementia. 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 07/03/2020 upon evaluation today patient appears to be doing well with regard to her heel ulcer. She did have arterial studies they appear to be doing excellent it was premature normal across the board with TBI's in the 90s and ABIs well within normal range. Nonetheless there does not appear to  be any signs  of arterial insufficiency whatsoever. With that being said the patient does have also signs of improvement there is some necrotic tissue in the base of the wound number to try to clear some of that away today I do believe the Iodoflex/Iodosorb is doing well. 5/24; difficult punched out wound on the left medial heel. She has been using Iodoflex 07/17/2020 upon evaluation today patient appears to be doing well at this point in regard to her wound. I do feel like this is a little bit deeper but again that is what is expected as we continue with the Iodoflex I think that this is just going to get deeper until we get to the base of the wound. With that being said I think we are getting much closer to the base of the wound where we can have healthier tissue that we get a be managing here which that will be awesome. In the meantime I am not surprised by what I am seeing and in fact the wound appears to be better as compared to previous findings. 07/24/2020 upon evaluation today patient appears to be doing well with regard to her wound. Overall I am extremely pleased with where things stand today. I do not see any signs of active infection which is great and overall I think that the patient is making good progress. There is good to be some need for sharp debridement today. 07/31/2020 upon evaluation today patient appears to be doing better in regard to her heel ulcer. She has been tolerating the dressing changes without complication. Fortunately there does not appear to be any signs of active infection which is great and overall very pleased in this regard. No fevers, chills, nausea, vomiting, or diarrhea. 08/14/2020 upon evaluation today patient appears to be doing better in regard to her heel ulcer. I am very pleased in that regard. Unfortunately she still has a slight deep tissue injury in regard to the medial portion of her foot over the same area. Unfortunately I think this is something that if were not  careful it was can open up into the wound. That is my main concern here based on what I see. Fortunately there does not appear to be any evidence of active infection which is great news and overall very pleased with where things stand at this point. 08/21/2020 upon evaluation today patient appears to be doing well with regard to her wound. She has been tolerating the dressing changes without complication. Fortunately there does not appear to be any signs of active infection at this time. No fevers, chills, nausea, vomiting, or diarrhea. I do believe the compression wrap was beneficial for her. 08/28/2020 upon evaluation today patient appears to be doing well with regard to her wounds currently. Fortunately there does not appear to be any signs of active infection at this time. No fevers, chills, nausea, vomiting, or diarrhea. With that being said I think that her leg is doing quite well to be honest. 09/04/2020 upon evaluation today patient appears to be doing well with regard to her wound on the heel. This is showing signs of good epithelial growth although there is probably can it be an indention where this heals that is okay as long as we get it closed. Fortunately I do not see any evidence of infection at this point. 09/13/2020 upon evaluation today patient appears to be doing well with regard to her wounds. She has been tolerating the dressing changes Hazelett, Hser L. ([id number]) without complication. Fortunately  he is actually doing extremely well and I think she is making great progress this is measuring smaller. I would recommend that such that we continue with the [facility] likely since he is doing so well. 09/18/2020 upon evaluation today patient appears to be doing well with regard to her heel ulcer. She is making good progress and I am very pleased with what we see today. I think the Hydrofera Blue is still doing excellent. 10/02/2020 upon evaluation today patient's wound actually appears  to be showing signs of good improvement in regard to the heel. I am very pleased in this regard. With that being said I do believe that the area which is somewhat been stable and dry is starting to lift up and beginning to clear this away as well that is on the foot. Subsequently I am going to have to perform some debridement here and we did actually go ahead and allow this as a wound today as before it was just more deep tissue injury and eschar covering now I think it is a little bit more than that to be honest. 10/09/2020 upon evaluation today patient appears to be doing decently well in regard to her wounds. I am actually very pleased with where things stand today. There does not appear to be any signs of active infection which is great news. No fevers, chills, nausea, vomiting, or diarrhea. She is going require some sharp debridement today. 10/16/2020 upon evaluation today patient appears to be doing decently well in regard to her heel as well as the foot. Both are showing signs of good improvement which is great news. In general and extremely pleased with where things stand at this point. She is tolerating the [facility] well although this is getting so small in the heel I think collagen may be a better option here based on what I am seeing. With regard to the foot the Iodoflex/Iodosorb is still probably the best method here. 10/26/2020 upon evaluation today patient actually appears to be doing well in some respects today. She has been tolerating the dressing changes without complication and this is great news. The Hydrofera Blue is done well for the heel this actually appears to be healed today. With that being said in regard to the foot I think we are ready to switch to [facility] based on what I see at this point. 10/30/2020 upon evaluation today patient appears to be doing well with regard to his wound. He has been tolerating the dressing changes without complication. Fortunately there  does not appear to be any signs of active infection systemically at this time which is great news and overall very pleased with where things stand today. I do think that she is making progress although it somewhat slow still where showing signs of improvement little by little here. 11/06/2020 upon evaluation today patient appears to be doing decently well in regard to her wound. We will start and see better overall appearance of the base of the wound which is great news she still continues to have some abnormal fluorescence signals with a MolecuLight DX that will be detailed below. 11/20/2020 upon evaluation today patient's wound actually showing signs of improvement. There is good to be some need for sharp debridement today and that was discussed with the patient. I am going to go ahead and clean this up but I do believe the [facility] is doing a great job. 11/27/2020; the patient had eschar over the original heel wound which I removed with  a #3 curette there was nothing open here. The area is on the lateral left foot foot. 12/04/2020 upon evaluation today patient appears to be doing well with regard to her wound. The wound bed actually appeared to be doing well after I remove the dry endoform from the wound bed. This I tracked little bit of fluid but nonetheless underneath this appears to be doing awesome. I am very pleased with where things stand today. No fevers, chills, nausea, vomiting, or diarrhea. 12/11/2020 upon evaluation today patient appears to be doing well with regard to her wound although it keeps getting dry and filled up with the endoform I am not seeing any signs of infection but we are going to have to clean this away in order to try and get things moving in an appropriate direction. I discussed that with patient's daughter today as well. She is in agreement with proceeding as such. 12/21/2020 upon evaluation today patient appears to be doing well with regard to her wound this did  not seal off like it was with the collagen but nonetheless also did not really feeling quite as much as I would like to have seen. I do believe that we may need to go ahead and see about doing a debridement today and I really feel like we may want to switch back to the [facility] which I felt like was doing better in the past. Good news is the base of the wound appears healthy with good granulation tissue. 12/28/2020 upon evaluation patient's wound actually showed signs of doing quite well in regard to her foot. I think that we getting very close to complete resolution and overall I am extremely happy with where things stand today. 01/04/2021 upon evaluation today patient appears to be doing well with regard to her wound on the foot. This is good to require little bit of debridement today but overall seems to be doing quite well. I am actually very pleased with where we stand currently. 01/08/2021 upon evaluation today patient's wound actually showing signs of some improvement although she still has some callus buildup around the edges of the wound unfortunately I think this is continuing to rub despite what we are doing currently. I think that we may need to go ahead and see about switching up the dressing a little bit I Minna recommend collagen and then on top of the collagen we will get a use some foam double layered cut in a doughnut to try to take pressure off of this area we will see how this does over the next week. 01/15/2021 upon evaluation today patient's wound is actually showing signs of being completely dried over with the collagen. This is definitely not what we are looking for. I think that the wound dried out too quickly is the main issue that we are running into at this point and I really think we may need to do something else try to keep that from occurring. 01/22/2021 upon evaluation today patient's wound actually did stay open it did not callus over as its been doing in the past  this is good news. Also feel like it may not be quite as deep as what we have previously seen. There is still not as much improvement he does what I would like to see but nonetheless I think we are headed in the right direction. 01/29/2021 upon evaluation today patient appears to be doing okay in regard to her wound is not too dry but unfortunately is too wet the  Xeroform just did not do what I was hoping that she made things a little bit worse as far as size wise I am actually going to try at this point considering this is more open but not as deep the Hydrofera Blue to see if this will be beneficial at this time. Again we seem to be cycling through things and it is becoming a little difficult to get this to completely close but in the past Hydrofera Blue is what she did the best with 1 week to keep it from closing up prematurely. 02/05/2021 upon evaluation today patient's wound actually showing some signs of improvement which is great news. Fortunately I do not see any evidence of active infection locally nor systemically. It is definitely not too wet which is been the biggest issue we have seen up to this point over the past several weeks. [name], [name] ([id number]) 02/21/2021 upon evaluation today patient appears to be doing excellent in regard to her wound. This actually appears much improved compared to last time I saw her which is great news as well. No fevers, chills, nausea, vomiting, or diarrhea. 02/28/2021 upon evaluation today patient's wound on her foot actually appears to be doing quite well. I am very pleased with where things stand today. I do not see any evidence of active infection locally nor systemically at this time. No fevers, chills, nausea, vomiting, or diarrhea. 03/14/2021 upon evaluation today patient appears to be doing well with regard to her wound. There is a little bit of a limp on the plantar edge of the wound opening which I Minna see about trying to clear away today.  Fortunately I think other than this everything seems to be showing signs of improvement measurements still are roughly the same. It is definitely not as deep however. 03/28/2021 upon evaluation today patient appears to be doing well with regard to her wound. There is still some slough noted in the base of the wound. Fortunately there does not appear to be any evidence of active infection locally nor systemically at this time which is good news. 04/11/2021 upon evaluation today patient appears to be doing decently well in regard to her foot ulcer. This is showing a little bit of moisture buildup but fortunately nothing too significant I think the wound is looking better. This is just very slow to heal. Fortunately I do not see any evidence of active infection at this time. 04/18/2021 upon evaluation today patient appears to be doing well with regard to her ulcer. In fact this is getting very close to completely resolved. I am actually extremely pleased with where things stand today and how this appears. 05/02/2021 upon evaluation patient appears to be doing really about the same in regard to her wound I do not see any signs of significant worsening which is great news and overall very pleased in that regard. With that being said I do feel like that the patient is continuing to have really stalling of the wound she also has a couple areas that appear to be deep tissue injuries that are somewhat unfortunate to be perfectly honest. With that being said I do see evidence here in general of improvement which is good with regard to the depth of the wound we just need to try to protect her legs and hopefully her feet while allowing this last little bit to heal. Patient's daughter is doing an excellent job taking care of her and I definitely do not see any issues in that regard  at all she is very diligent in trying to keep pressure off as far as the feet are concerned. Nonetheless I do believe the patient still  despite all of this is still getting several areas of deep tissue injury which raises the question as to why is this a blood flow issue or something else in the past she really has done well and had no significant blood flow limitations with her ABIs of May 2020 to be excellent and normal. We will see how things progress and if we need to repeat this we will definitely do so. 05/09/2021 upon evaluation today patient unfortunately appears to not be doing quite as well as what I previously saw. In fact her feet seem to be showing signs of breakdown at several locations as well as the wound. In general she has not been eating or drinking well I am kind of concerned that she may be shutting down to some degree. 05-23-2021 on evaluation today patient appears to be doing well all things considered with regard to her wound. She does have a little bit of erythema around the medial foot ulcer none of the other deep tissue injuries have open which is good news. Fortunately I see no evidence of active infection locally nor systemically at this time which is also great news. No fevers, chills, nausea, vomiting, or diarrhea. 06-06-2021 upon evaluation today patient appears to be doing better in regard to her foot ulcers. Everything is actually showing signs of improvement which is great news. Fortunately I do not see any signs of active infection locally nor systemically which is great news. In fact her overall coloring of her foot seems better and she is also not having as much pain as she was. Her blood pressure is also more stable and had been dropping very low obviously I think that that is probably a big part of why things are looking better. 4/27; using Hydrofera Blue and 2 wounds on the left foot on the tip of the left great toe and on the left medial foot 5/8; the patient however has 3 wounds on the left foot. 1 at the tip of the left great toe, 1 medially on the left foot and another on the lateral  fifth metatarsal head. Using Hydrofera Blue in all wounds. She does not complain of pain. 07-01-2021 upon evaluation today patient appears to be doing well with regard to her wounds everything is appearing to do well no signs of infection in general and very pleased with where we stand today. There does not appear to be any signs of active infection locally or systemically which is great news. No fevers, chills, nausea, vomiting, or diarrhea. 07-22-2021 upon evaluation today patient appears to be doing well in general in regard to her wound she does have a new wound on the right shin this is due to a trauma that is a small skin tear. This happened in the past week. With that being said overall she seems to be doing quite well I do not see any evidence of a infection which is great news. No fevers, chills, nausea, vomiting, or diarrhea. 08-12-2021 upon evaluation today patient appears to be doing well with regard to her feet. Overall the wound on the right leg is healed the wound on the left foot area at all locations seem to be doing okay I do not see any signs of obvious active infection at this time which is great news. 09-02-2021 upon evaluation today patient appears to be  doing well currently in regard to her wounds on the legs specifically her feet. She has been tolerating the dressing changes without complication. Good news is she seems to be tolerating the dressing changes without any complication and is doing much better the only issue she has she does have a skin tear on the right forearm that actually happened on the way into the clinic today. 09-23-2021 upon evaluation today patient appears to be doing well currently in regard to her wounds in fact everything is healed except for just 1 remaining area on the lateral aspect of her foot which is very tiny. I am extremely pleased with where things stand today. I do not see any evidence of active infection locally or systemically. Electronic  Signature(s) Signed: 09/23/2021 1:48:03 PM By: [name] Keeler PA-C Entered By: [name] [name] on 09/23/2021 13:48:03 [name], [name] ([id number]) -------------------------------------------------------------------------------- Physical Exam Details Patient Name: [name], [name] Date of Service: 09/23/2021 1:00 PM Medical Record Number: [id number] Patient Account Number: [id number] Date of Birth/Sex: 1927/02/04 (88 y.o. F) Treating RN: [name] [name] Primary Care Provider: Lelon [name] Other Clinician: Massie [name] Referring Provider: Lelon [name] Treating Provider/Extender: [name] [name] in Treatment: 32 Constitutional Well-nourished and well-hydrated in no acute distress. Respiratory normal breathing without difficulty. Psychiatric this patient is able to make decisions and demonstrates good insight into disease process. Alert and Oriented x 3. pleasant and cooperative. Notes Upon inspection patient's wound bed actually showed signs of good granulation and epithelization at this point. Actually in fact the only wound that is still remaining open is just a very tiny area. Electronic Signature(s) Signed: 09/23/2021 1:51:02 PM By: [name] Keeler PA-C Entered By: [name] [name] on 09/23/2021 13:51:02 [name], [name] ([id number]) -------------------------------------------------------------------------------- Physician Orders Details Patient Name: [name], [name] Date of Service: 09/23/2021 1:00 PM Medical Record Number: [id number] Patient Account Number: [id number] Date of Birth/Sex: 10/11/1926 (88 y.o. F) Treating RN: [name] [name] Primary Care Provider: Lelon [name] Other Clinician: Massie [name] Referring Provider: Lelon [name] Treating Provider/Extender: [name] [name] in Treatment: 4 Verbal / Phone Orders: No Diagnosis Coding ICD-10 Coding Code Description [phone] Pressure ulcer of other site, stage 3 S81.811A Laceration without foreign body, right lower  leg, initial encounter I10 Essential (primary) hypertension F01.50 Vascular dementia without behavioral disturbance I25.10 Atherosclerotic heart disease of native coronary artery without angina pectoris S51.811A Laceration without foreign body of right forearm, initial encounter Follow-up Appointments o Return Appointment in 3 weeks. - due to holiday Bathing/ Shower/ Hygiene o May shower with wound dressing protected with water repellent cover or cast protector. o No tub bath. Anesthetic (Use 'Patient Medications' Section for Anesthetic Order Entry) o Lidocaine applied to wound bed Edema Control - Lymphedema / Segmental Compressive Device / Other o Tubigrip single layer applied. - left leg size D o Patient to wear own compression stockings. Remove compression stockings every night before going to bed and put on every morning when getting up. - right leg o Elevate legs to the level of the heart and pump ankles as often as possible o Elevate leg(s) parallel to the floor when sitting. o DO YOUR BEST to sleep in the bed at night. DO NOT sleep in your recliner. Long hours of sitting in a recliner leads to swelling of the legs and/or potential wounds on your backside. Non-Wound Condition o Additional non-wound orders/instructions: - may continue the Betadine to the pressure areas left foot Off-Loading o Other: - Offloading boots while in bed; Try pillow under sheet  to keep it in place. Additional Orders / Instructions o Follow Nutritious Diet and Increase Protein Intake Wound Treatment Wound #6 - Foot Wound Laterality: Left, Lateral Cleanser: Normal Saline Every Other Day/30 Days Discharge Instructions: Wash your hands with soap and water. Remove old dressing, discard into plastic bag and place into trash. Cleanse the wound with Normal Saline prior to applying a clean dressing using gauze sponges, not tissues or cotton balls. Do not scrub or use excessive force. Pat  dry using gauze sponges, not tissue or cotton balls. Topical: Gentamicin Every Other Day/30 Days Discharge Instructions: Apply as directed by provider. Primary Dressing: Hydrofera Blue Ready Transfer Foam, 2.5x2.5 (in/in) Every Other Day/30 Days Discharge Instructions: Apply Hydrofera Blue Ready to wound bed as directed Secondary Dressing: (SILICONE BORDER) Zetuvit Plus SILICONE BORDER Dressing 4x4 (in/in) Every Other Day/30 Days Discharge Instructions: Please do not put silicone bordered dressings under wraps. Use non-bordered dressing only. Secured With: Tubigrip Size D, 3x10 (in/yd) Every Other Day/30 Days [name], [name] ([id number]) Electronic Signature(s) Signed: 09/23/2021 4:15:07 PM By: [name] [name] Signed: 09/23/2021 4:15:14 PM By: [name] Keeler PA-C Entered By: [name] [name] on 09/23/2021 13:45:50 [name], [name] ([id number]) -------------------------------------------------------------------------------- Problem List Details Patient Name: Filsaime, Emelda L. Date of Service: 09/23/2021 1:00 PM Medical Record Number: [id number] Patient Account Number: [id number] Date of Birth/Sex: [birth year] (88 y.o. F) Treating RN: [name] [name] Primary Care Provider: Lelon [name] Other Clinician: Massie [name] Referring Provider: Lelon [name] Treating Provider/Extender: [name] [name] in Treatment: 64 Active Problems ICD-10 Encounter Code Description Active Date MDM Diagnosis L89.893 Pressure ulcer of other site, stage 3 10/02/2020 No Yes S81.811A Laceration without foreign body, right lower leg, initial encounter 07/22/2021 No Yes I10 Essential (primary) hypertension 06/26/2020 No Yes F01.50 Vascular dementia without behavioral disturbance 06/26/2020 No Yes I25.10 Atherosclerotic heart disease of native coronary artery without angina 06/26/2020 No Yes pectoris S51.811A Laceration without foreign body of right forearm, initial encounter 09/02/2021 No Yes Inactive Problems Resolved  Problems ICD-10 Code Description Active Date Resolved Date L89.623 Pressure ulcer of left heel, stage 3 06/26/2020 06/26/2020 Electronic Signature(s) Signed: 09/23/2021 1:26:13 PM By: [name] Keeler PA-C Entered By: [name] [name] on 09/23/2021 13:26:13 [name], [name] ([id number]) -------------------------------------------------------------------------------- Progress Note Details Patient Name: [name], [name] Date of Service: 09/23/2021 1:00 PM Medical Record Number: [id number] Patient Account Number: [id number] Date of Birth/Sex: 1926/04/13 (88 y.o. F) Treating RN: [name] [name] Primary Care Provider: Lelon [name] Other Clinician: Massie [name] Referring Provider: Lelon [name] Treating Provider/Extender: [name] [name] in Treatment: 64 Subjective Chief Complaint Information obtained from Patient Left LE Ulcers on leg and foot and skin tear right arm History of Present Illness (HPI) 88 year old patient was recently seen by the PCPs office for significant pain right great toe which has been going on since July. She was initially treated with Keflex which she did not complete and after the office visit this time she has been put on doxycycline. at the time of her visit she was found to have a ulcer on the plantar surface of the right great toe and also had a pyogenic granuloma over this area. X-ray of the right foot done 12/29/2014 -- IMPRESSION:Soft-tissue swelling and ulceration right great toe. No underlying bony lytic lesion identified. If osteomyelitis remains of clinical concern MRI can be obtained. Past medical history significant for anemia, chronic kidney disease stage III, obesity, varicose veins, coronary artery disease, gout, history of nicotine addiction given up smoking in 2002, hypertension, status post cardiac catheterization, status  post abdominal hysterectomy, cholecystectomy and tonsillectomy. hemoglobin A1c done in August was 5.8 01/22/2015 -- at this stage  the [facility] was going to cost them significant amount of money and they want to defer using that at the present time. 01/29/2015 -- she had a podiatry appointment and they have trimmed her toenails. She has not heard back from the vascular office regarding her venous duplex study and I have asked them to call personally so that they can get the appointment soon. 02/06/2015 -- they have made contact with the vascular office and from what I understand that test has been done but the report is pending. 02/19/2014 -- the vascular test is scheduled for tomorrow Readmission: 06/26/2020 upon evaluation today patient appears for initial evaluation in her clinic that she has been here before in 2016 and has been quite sometime. She did have a fractured hip in February on the 23rd 2022. Subsequently this had to be pinned and she ended up with a pressure injury on her heel following when she was using her foot to help move her around in the bed. Subsequently this has led to the wound that she has been dealing with since that time. She lives at home with her daughter currently she does have dementia. 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 07/03/2020 upon evaluation today patient appears to be doing well with regard to her heel ulcer. She did have arterial studies they appear to be doing excellent it was premature normal across the board with TBI's in the 90s and ABIs well within normal range. Nonetheless there does not appear to be any signs of arterial insufficiency whatsoever. With that being said the patient does have also signs of improvement there is some necrotic tissue in the base of the wound number to try to clear some of that away today I do believe the Iodoflex/Iodosorb is doing well. 5/24; difficult punched out wound on the left medial heel. She has been  using Iodoflex 07/17/2020 upon evaluation today patient appears to be doing well at this point in regard to her wound. I do feel like this is a little bit deeper but again that is what is expected as we continue with the Iodoflex I think that this is just going to get deeper until we get to the base of the wound. With that being said I think we are getting much closer to the base of the wound where we can have healthier tissue that we get a be managing here which that will be awesome. In the meantime I am not surprised by what I am seeing and in fact the wound appears to be better as compared to previous findings. 07/24/2020 upon evaluation today patient appears to be doing well with regard to her wound. Overall I am extremely pleased with where things stand today. I do not see any signs of active infection which is great and overall I think that the patient is making good progress. There is good to be some need for sharp debridement today. 07/31/2020 upon evaluation today patient appears to be doing better in regard to her heel ulcer. She has been tolerating the dressing changes without complication. Fortunately there does not appear to be any signs of active infection which is great and overall very pleased in this regard. No fevers, chills, nausea, vomiting, or diarrhea. 08/14/2020 upon evaluation today patient appears to  be doing better in regard to her heel ulcer. I am very pleased in that regard. Unfortunately she still has a slight deep tissue injury in regard to the medial portion of her foot over the same area. Unfortunately I think this is something that if were not careful it was can open up into the wound. That is my main concern here based on what I see. Fortunately there does not appear to be any evidence of active infection which is great news and overall very pleased with where things stand at this point. 08/21/2020 upon evaluation today patient appears to be doing well with regard to her  wound. She has been tolerating the dressing changes without complication. Fortunately there does not appear to be any signs of active infection at this time. No fevers, chills, nausea, vomiting, or diarrhea. I do believe the compression wrap was beneficial for her. 08/28/2020 upon evaluation today patient appears to be doing well with regard to her wounds currently. Fortunately there does not appear to be any signs of active infection at this time. No fevers, chills, nausea, vomiting, or diarrhea. With that being said I think that her leg is doing quite well to be honest. Bascom, Sydnei L. ([id number]) 09/04/2020 upon evaluation today patient appears to be doing well with regard to her wound on the heel. This is showing signs of good epithelial growth although there is probably can it be an indention where this heals that is okay as long as we get it closed. Fortunately I do not see any evidence of infection at this point. 09/13/2020 upon evaluation today patient appears to be doing well with regard to her wounds. She has been tolerating the dressing changes without complication. Fortunately he is actually doing extremely well and I think she is making great progress this is measuring smaller. I would recommend that such that we continue with the [facility] since he is doing so well. 09/18/2020 upon evaluation today patient appears to be doing well with regard to her heel ulcer. She is making good progress and I am very pleased with what we see today. I think the Hydrofera Blue is still doing excellent. 10/02/2020 upon evaluation today patient's wound actually appears to be showing signs of good improvement in regard to the heel. I am very pleased in this regard. With that being said I do believe that the area which is somewhat been stable and dry is starting to lift up and beginning to clear this away as well that is on the foot. Subsequently I am going to have to perform some debridement here  and we did actually go ahead and allow this as a wound today as before it was just more deep tissue injury and eschar covering now I think it is a little bit more than that to be honest. 10/09/2020 upon evaluation today patient appears to be doing decently well in regard to her wounds. I am actually very pleased with where things stand today. There does not appear to be any signs of active infection which is great news. No fevers, chills, nausea, vomiting, or diarrhea. She is going require some sharp debridement today. 10/16/2020 upon evaluation today patient appears to be doing decently well in regard to her heel as well as the foot. Both are showing signs of good improvement which is great news. In general and extremely pleased with where things stand at this point. She is tolerating the [facility] well although this is getting so small in the  heel I think collagen may be a better option here based on what I am seeing. With regard to the foot the Iodoflex/Iodosorb is still probably the best method here. 10/26/2020 upon evaluation today patient actually appears to be doing well in some respects today. She has been tolerating the dressing changes without complication and this is great news. The Hydrofera Blue is done well for the heel this actually appears to be healed today. With that being said in regard to the foot I think we are ready to switch to [facility] based on what I see at this point. 10/30/2020 upon evaluation today patient appears to be doing well with regard to his wound. He has been tolerating the dressing changes without complication. Fortunately there does not appear to be any signs of active infection systemically at this time which is great news and overall very pleased with where things stand today. I do think that she is making progress although it somewhat slow still where showing signs of improvement little by little here. 11/06/2020 upon evaluation today patient appears to  be doing decently well in regard to her wound. We will start and see better overall appearance of the base of the wound which is great news she still continues to have some abnormal fluorescence signals with a MolecuLight DX that will be detailed below. 11/20/2020 upon evaluation today patient's wound actually showing signs of improvement. There is good to be some need for sharp debridement today and that was discussed with the patient. I am going to go ahead and clean this up but I do believe the [facility] is doing a great job. 11/27/2020; the patient had eschar over the original heel wound which I removed with a #3 curette there was nothing open here. The area is on the lateral left foot foot. 12/04/2020 upon evaluation today patient appears to be doing well with regard to her wound. The wound bed actually appeared to be doing well after I remove the dry endoform from the wound bed. This I tracked little bit of fluid but nonetheless underneath this appears to be doing awesome. I am very pleased with where things stand today. No fevers, chills, nausea, vomiting, or diarrhea. 12/11/2020 upon evaluation today patient appears to be doing well with regard to her wound although it keeps getting dry and filled up with the endoform I am not seeing any signs of infection but we are going to have to clean this away in order to try and get things moving in an appropriate direction. I discussed that with patient's daughter today as well. She is in agreement with proceeding as such. 12/21/2020 upon evaluation today patient appears to be doing well with regard to her wound this did not seal off like it was with the collagen but nonetheless also did not really feeling quite as much as I would like to have seen. I do believe that we may need to go ahead and see about doing a debridement today and I really feel like we may want to switch back to the [facility] which I felt like was doing better in the past.  Good news is the base of the wound appears healthy with good granulation tissue. 12/28/2020 upon evaluation patient's wound actually showed signs of doing quite well in regard to her foot. I think that we getting very close to complete resolution and overall I am extremely happy with where things stand today. 01/04/2021 upon evaluation today patient appears to be doing well with  regard to her wound on the foot. This is good to require little bit of debridement today but overall seems to be doing quite well. I am actually very pleased with where we stand currently. 01/08/2021 upon evaluation today patient's wound actually showing signs of some improvement although she still has some callus buildup around the edges of the wound unfortunately I think this is continuing to rub despite what we are doing currently. I think that we may need to go ahead and see about switching up the dressing a little bit I Minna recommend collagen and then on top of the collagen we will get a use some foam double layered cut in a doughnut to try to take pressure off of this area we will see how this does over the next week. 01/15/2021 upon evaluation today patient's wound is actually showing signs of being completely dried over with the collagen. This is definitely not what we are looking for. I think that the wound dried out too quickly is the main issue that we are running into at this point and I really think we may need to do something else try to keep that from occurring. 01/22/2021 upon evaluation today patient's wound actually did stay open it did not callus over as its been doing in the past this is good news. Also feel like it may not be quite as deep as what we have previously seen. There is still not as much improvement he does what I would like to see but nonetheless I think we are headed in the right direction. 01/29/2021 upon evaluation today patient appears to be doing okay in regard to her wound is not too  dry but unfortunately is too wet the Xeroform just did not do what I was hoping that she made things a little bit worse as far as size wise I am actually going to try at this point considering this is more open but not as deep the Hydrofera Blue to see if this will be beneficial at this time. Again we seem to be cycling through things and it is becoming a little difficult to get this to completely close but in the past Hydrofera Blue is what she did the best with 1 week to keep Gee, Taylie L. ([id number]) it from closing up prematurely. 02/05/2021 upon evaluation today patient's wound actually showing some signs of improvement which is great news. Fortunately I do not see any evidence of active infection locally nor systemically. It is definitely not too wet which is been the biggest issue we have seen up to this point over the past several weeks. 02/21/2021 upon evaluation today patient appears to be doing excellent in regard to her wound. This actually appears much improved compared to last time I saw her which is great news as well. No fevers, chills, nausea, vomiting, or diarrhea. 02/28/2021 upon evaluation today patient's wound on her foot actually appears to be doing quite well. I am very pleased with where things stand today. I do not see any evidence of active infection locally nor systemically at this time. No fevers, chills, nausea, vomiting, or diarrhea. 03/14/2021 upon evaluation today patient appears to be doing well with regard to her wound. There is a little bit of a limp on the plantar edge of the wound opening which I Minna see about trying to clear away today. Fortunately I think other than this everything seems to be showing signs of improvement measurements still are roughly the same. It is definitely not  as deep however. 03/28/2021 upon evaluation today patient appears to be doing well with regard to her wound. There is still some slough noted in the base of the wound. Fortunately  there does not appear to be any evidence of active infection locally nor systemically at this time which is good news. 04/11/2021 upon evaluation today patient appears to be doing decently well in regard to her foot ulcer. This is showing a little bit of moisture buildup but fortunately nothing too significant I think the wound is looking better. This is just very slow to heal. Fortunately I do not see any evidence of active infection at this time. 04/18/2021 upon evaluation today patient appears to be doing well with regard to her ulcer. In fact this is getting very close to completely resolved. I am actually extremely pleased with where things stand today and how this appears. 05/02/2021 upon evaluation patient appears to be doing really about the same in regard to her wound I do not see any signs of significant worsening which is great news and overall very pleased in that regard. With that being said I do feel like that the patient is continuing to have really stalling of the wound she also has a couple areas that appear to be deep tissue injuries that are somewhat unfortunate to be perfectly honest. With that being said I do see evidence here in general of improvement which is good with regard to the depth of the wound we just need to try to protect her legs and hopefully her feet while allowing this last little bit to heal. Patient's daughter is doing an excellent job taking care of her and I definitely do not see any issues in that regard at all she is very diligent in trying to keep pressure off as far as the feet are concerned. Nonetheless I do believe the patient still despite all of this is still getting several areas of deep tissue injury which raises the question as to why is this a blood flow issue or something else in the past she really has done well and had no significant blood flow limitations with her ABIs of May 2020 to be excellent and normal. We will see how things progress and if we  need to repeat this we will definitely do so. 05/09/2021 upon evaluation today patient unfortunately appears to not be doing quite as well as what I previously saw. In fact her feet seem to be showing signs of breakdown at several locations as well as the wound. In general she has not been eating or drinking well I am kind of concerned that she may be shutting down to some degree. 05-23-2021 on evaluation today patient appears to be doing well all things considered with regard to her wound. She does have a little bit of erythema around the medial foot ulcer none of the other deep tissue injuries have open which is good news. Fortunately I see no evidence of active infection locally nor systemically at this time which is also great news. No fevers, chills, nausea, vomiting, or diarrhea. 06-06-2021 upon evaluation today patient appears to be doing better in regard to her foot ulcers. Everything is actually showing signs of improvement which is great news. Fortunately I do not see any signs of active infection locally nor systemically which is great news. In fact her overall coloring of her foot seems better and she is also not having as much pain as she was. Her blood pressure is also more stable  and had been dropping very low obviously I think that that is probably a big part of why things are looking better. 4/27; using Hydrofera Blue and 2 wounds on the left foot on the tip of the left great toe and on the left medial foot 5/8; the patient however has 3 wounds on the left foot. 1 at the tip of the left great toe, 1 medially on the left foot and another on the lateral fifth metatarsal head. Using Hydrofera Blue in all wounds. She does not complain of pain. 07-01-2021 upon evaluation today patient appears to be doing well with regard to her wounds everything is appearing to do well no signs of infection in general and very pleased with where we stand today. There does not appear to be any signs of active  infection locally or systemically which is great news. No fevers, chills, nausea, vomiting, or diarrhea. 07-22-2021 upon evaluation today patient appears to be doing well in general in regard to her wound she does have a new wound on the right shin this is due to a trauma that is a small skin tear. This happened in the past week. With that being said overall she seems to be doing quite well I do not see any evidence of a infection which is great news. No fevers, chills, nausea, vomiting, or diarrhea. 08-12-2021 upon evaluation today patient appears to be doing well with regard to her feet. Overall the wound on the right leg is healed the wound on the left foot area at all locations seem to be doing okay I do not see any signs of obvious active infection at this time which is great news. 09-02-2021 upon evaluation today patient appears to be doing well currently in regard to her wounds on the legs specifically her feet. She has been tolerating the dressing changes without complication. Good news is she seems to be tolerating the dressing changes without any complication and is doing much better the only issue she has she does have a skin tear on the right forearm that actually happened on the way into the clinic today. 09-23-2021 upon evaluation today patient appears to be doing well currently in regard to her wounds in fact everything is healed except for just 1 remaining area on the lateral aspect of her foot which is very tiny. I am extremely pleased with where things stand today. I do not see any evidence of active infection locally or systemically. Dolezal, Wakeelah L. ([id number]) Objective Constitutional Well-nourished and well-hydrated in no acute distress. Vitals Time Taken: 1:11 PM, Height: 58 in, Weight: 137 lbs, BMI: 28.6, Temperature: 98.3 F, Pulse: 71 bpm, Respiratory Rate: 16 breaths/min, Blood Pressure: 162/79 mmHg. Respiratory normal breathing without difficulty. Psychiatric this  patient is able to make decisions and demonstrates good insight into disease process. Alert and Oriented x 3. pleasant and cooperative. General Notes: Upon inspection patient's wound bed actually showed signs of good granulation and epithelization at this point. Actually in fact the only wound that is still remaining open is just a very tiny area. Integumentary (Hair, Skin) Wound #3 status is Healed - Epithelialized. Original cause of wound was Pressure Injury. The date acquired was: 10/02/2020. The wound has been in treatment 50 weeks. The wound is located on the Left,Midline Foot. The wound measures 0cm length x 0cm width x 0cm depth; 0cm^2 area and 0cm^3 volume. There is Fat Layer (Subcutaneous Tissue) exposed. There is a medium amount of serosanguineous drainage noted. The wound margin is thickened.  There is small (1-33%) pink granulation within the wound bed. There is a large (67-100%) amount of necrotic tissue within the wound bed. Wound #6 status is Open. Original cause of wound was Gradually Appeared. The date acquired was: 06/24/2021. The wound has been in treatment 13 weeks. The wound is located on the Left,Lateral Foot. The wound measures 0.1cm length x 0.1cm width x 0.1cm depth; 0.008cm^2 area and 0.001cm^3 volume. There is Fat Layer (Subcutaneous Tissue) exposed. There is a medium amount of serosanguineous drainage noted. There is medium (34-66%) pink, pale granulation within the wound bed. There is a medium (34-66%) amount of necrotic tissue within the wound bed including Adherent Slough. Wound #8 status is Open. Original cause of wound was Trauma. The date acquired was: 09/02/2021. The wound has been in treatment 3 weeks. The wound is located on the Right Forearm. The wound measures 0cm length x 0cm width x 0cm depth; 0cm^2 area and 0cm^3 volume. There is a none present amount of drainage noted. There is large (67-100%) red granulation within the wound bed. There is no necrotic tissue  within the wound bed. Assessment Active Problems ICD-10 Pressure ulcer of other site, stage 3 Laceration without foreign body, right lower leg, initial encounter Essential (primary) hypertension Vascular dementia without behavioral disturbance Atherosclerotic heart disease of native coronary artery without angina pectoris Laceration without foreign body of right forearm, initial encounter Plan Follow-up Appointments: Return Appointment in 3 weeks. - due to holiday Bathing/ Shower/ Hygiene: May shower with wound dressing protected with water repellent cover or cast protector. No tub bath. Anesthetic (Use 'Patient Medications' Section for Anesthetic Order Entry): Lidocaine applied to wound bed Edema Control - Lymphedema / Segmental Compressive Device / Other: Tubigrip single layer applied. - left leg size D Patient to wear own compression stockings. Remove compression stockings every night before going to bed and put on every morning when getting up. - right leg Elevate legs to the level of the heart and pump ankles as often as possible Elevate leg(s) parallel to the floor when sitting. Coward, Julliette L. ([id number]) DO YOUR BEST to sleep in the bed at night. DO NOT sleep in your recliner. Long hours of sitting in a recliner leads to swelling of the legs and/or potential wounds on your backside. Non-Wound Condition: Additional non-wound orders/instructions: - may continue the Betadine to the pressure areas left foot Off-Loading: Other: - Offloading boots while in bed; Try pillow under sheet to keep it in place. Additional Orders / Instructions: Follow Nutritious Diet and Increase Protein Intake WOUND #6: - Foot Wound Laterality: Left, Lateral Cleanser: Normal Saline Every Other Day/30 Days Discharge Instructions: Wash your hands with soap and water. Remove old dressing, discard into plastic bag and place into trash. Cleanse the wound with Normal Saline prior to applying a clean  dressing using gauze sponges, not tissues or cotton balls. Do not scrub or use excessive force. Pat dry using gauze sponges, not tissue or cotton balls. Topical: Gentamicin Every Other Day/30 Days Discharge Instructions: Apply as directed by provider. Primary Dressing: Hydrofera Blue Ready Transfer Foam, 2.5x2.5 (in/in) Every Other Day/30 Days Discharge Instructions: Apply Hydrofera Blue Ready to wound bed as directed Secondary Dressing: (SILICONE BORDER) Zetuvit Plus SILICONE BORDER Dressing 4x4 (in/in) Every Other Day/30 Days Discharge Instructions: Please do not put silicone bordered dressings under wraps. Use non-bordered dressing only. Secured With: Tubigrip Size D, 3x10 (in/yd) Every Other Day/30 Days 1. Based on what I am seeing I am good recommend that we continue  with the current wound care measures. This includes the use of the Hydrofera Blue blue we are using just a dab of gentamicin underneath. 2. I am also can recommend that we continue with the bordered foam dressing to cover. 3. I am also going to suggest we continue with Tubigrip size D which seems to be doing excellent for her. We will see patient back for reevaluation in 3 weeks here in the clinic. If anything worsens or changes patient will contact our office for additional recommendations. Electronic Signature(s) Signed: 09/23/2021 1:54:50 PM By: [name] Keeler PA-C Entered By: [name] [name] on 09/23/2021 13:54:50 Nou, Yuriko L. ([id number]) -------------------------------------------------------------------------------- SuperBill Details Patient Name: Crass, Caleyah L. Date of Service: 09/23/2021 Medical Record Number: [id number] Patient Account Number: [id number] Date of Birth/Sex: 09/11/1926 (88 y.o. F) Treating RN: [name] [name] Primary Care Provider: Lelon [name] Other Clinician: Massie [name] Referring Provider: Lelon [name] Treating Provider/Extender: [name] [name] in Treatment: 64 Diagnosis  Coding ICD-10 Codes Code Description [phone] Pressure ulcer of other site, stage 3 S81.811A Laceration without foreign body, right lower leg, initial encounter [place] (primary) hypertension I25.10 Atherosclerotic heart disease of native coronary artery without angina pectoris S51.811A Laceration without foreign body of right forearm, initial encounter Facility Procedures CPT4 Code: [id number] Description: 99213 - WOUND CARE VISIT-LEV 3 EST PT Modifier: Quantity: 1 Physician Procedures CPT4 Code: [id number] Description: 99213 - WC PHYS LEVEL 3 - EST PT Modifier: Quantity: 1 CPT4 Code: Description: ICD-10 Diagnosis Description L89.893 Pressure ulcer of other site, stage 3 S81.811A Laceration without foreign body, right lower leg, initial encounter I10 Essential (primary) hypertension S51.811A Laceration without foreign body of right  forearm, initial encounter Modifier: Quantity: Electronic Signature(s) Signed: 09/23/2021 1:55:44 PM By: [name] Keeler PA-C Entered By: [name] [name] on 09/23/2021 13:55:44

## 2021-09-25 NOTE — Progress Notes (Signed)
[name], [name] ([id number]) Visit Report for 09/23/2021 Arrival Information Details Patient Name: [name] [name], [name] Date of Service: 09/23/2021 1:00 PM Medical Record Number: [id number] Patient Account Number: [id number] Date of Birth/Sex: 1927-02-11 (88 y.o. F) Treating RN: Cornell Barman Primary Care Travez Stancil: [name] Other Clinician: Massie Kluver Referring Mikena Masoner: [name] Treating Kynslie Ringle/Extender: Skipper Cliche in Treatment: 68 Visit Information History Since Last Visit All ordered tests and consults were completed: No Patient Arrived: Wheel Chair Added or deleted any medications: No Arrival Time: 13:09 Any new allergies or adverse reactions: No Transfer Assistance: EasyPivot Patient Lift Had a fall or experienced change in No Patient Requires Transmission-Based No activities of daily living that may affect Precautions: risk of falls: Patient Has Alerts: Yes Hospitalized since last visit: No Patient Alerts: NOT diabetic Pain Present Now: No ABI R1.18 L1.25 06/27/20 Electronic Signature(s) Signed: 09/23/2021 4:15:07 PM By: Massie Kluver Entered By: Massie Kluver on 09/23/2021 13:11:13 [name] [name], [name] ([id number]) -------------------------------------------------------------------------------- Clinic Level of Care Assessment Details Patient Name: [name] [name], [name] Date of Service: 09/23/2021 1:00 PM Medical Record Number: [id number] Patient Account Number: [id number] Date of Birth/Sex: 02-16-1927 (88 y.o. F) Treating RN: Cornell Barman Primary Care Khamille Beynon: [name] Other Clinician: Massie Kluver Referring Judea Riches: [name] Treating Sheryll Dymek/Extender: Skipper Cliche in Treatment: 64 Clinic Level of Care Assessment Items TOOL 4 Quantity Score []  - Use when only an EandM is performed on FOLLOW-UP visit 0 ASSESSMENTS - Nursing Assessment / Reassessment X - Reassessment of Co-morbidities (includes updates in patient status) 1 10 X- 1  5 Reassessment of Adherence to Treatment Plan ASSESSMENTS - Wound and Skin Assessment / Reassessment []  - Simple Wound Assessment / Reassessment - one wound 0 X- 3 5 Complex Wound Assessment / Reassessment - multiple wounds []  - 0 Dermatologic / Skin Assessment (not related to wound area) ASSESSMENTS - Focused Assessment []  - Circumferential Edema Measurements - multi extremities 0 []  - 0 Nutritional Assessment / Counseling / Intervention []  - 0 Lower Extremity Assessment (monofilament, tuning fork, pulses) []  - 0 Peripheral Arterial Disease Assessment (using hand held doppler) ASSESSMENTS - Ostomy and/or Continence Assessment and Care []  - Incontinence Assessment and Management 0 []  - 0 Ostomy Care Assessment and Management (repouching, etc.) PROCESS - Coordination of Care X - Simple Patient / Family Education for ongoing care 1 15 []  - 0 Complex (extensive) Patient / Family Education for ongoing care []  - 0 Staff obtains Programmer, systems, Records, Test Results / Process Orders []  - 0 Staff telephones HHA, Nursing Homes / Clarify orders / etc []  - 0 Routine Transfer to another Facility (non-emergent condition) []  - 0 Routine Hospital Admission (non-emergent condition) []  - 0 New Admissions / Biomedical engineer / Ordering NPWT, Apligraf, etc. []  - 0 Emergency Hospital Admission (emergent condition) X- 1 10 Simple Discharge Coordination []  - 0 Complex (extensive) Discharge Coordination PROCESS - Special Needs []  - Pediatric / Minor Patient Management 0 []  - 0 Isolation Patient Management []  - 0 Hearing / Language / Visual special needs []  - 0 Assessment of Community assistance (transportation, D/C planning, etc.) []  - 0 Additional assistance / Altered mentation []  - 0 Support Surface(s) Assessment (bed, cushion, seat, etc.) INTERVENTIONS - Wound Cleansing / Measurement [name], [name] []  - 0 Simple Wound Cleansing - one wound X- 3 5 Complex Wound  Cleansing - multiple wounds X- 1 5 Wound Imaging (photographs - any number of wounds) []  - 0 Wound Tracing (instead of photographs) X- 1 5 Simple Wound  Measurement - one wound []  - 0 Complex Wound Measurement - multiple wounds INTERVENTIONS - Wound Dressings []  - Small Wound Dressing one or multiple wounds 0 X- 1 15 Medium Wound Dressing one or multiple wounds []  - 0 Large Wound Dressing one or multiple wounds X- 1 5 Application of Medications - topical []  - 0 Application of Medications - injection INTERVENTIONS - Miscellaneous []  - External ear exam 0 []  - 0 Specimen Collection (cultures, biopsies, blood, body fluids, etc.) []  - 0 Specimen(s) / Culture(s) sent or taken to Lab for analysis []  - 0 Patient Transfer (multiple staff / Civil Service fast streamer / Similar devices) []  - 0 Simple Staple / Suture removal (25 or less) []  - 0 Complex Staple / Suture removal (26 or more) []  - 0 Hypo / Hyperglycemic Management (close monitor of Blood Glucose) []  - 0 Ankle / Brachial Index (ABI) - do not check if billed separately X- 1 5 Vital Signs Has the patient been seen at the hospital within the last three years: Yes Total Score: 105 Level Of Care: New/Established - Level 3 Electronic Signature(s) Signed: 09/23/2021 4:15:07 PM By: Massie Kluver Entered By: Massie Kluver on 09/23/2021 13:46:57 [name], [name] -------------------------------------------------------------------------------- Encounter Discharge Information Details Patient Name: [name] [name], [name] Date of Service: 09/23/2021 1:00 PM Medical Record Number: [id number] Patient Account Number: [id number] Date of Birth/Sex: 06/23/1926 (88 y.o. F) Treating RN: Cornell Barman Primary Care Landyn Buckalew: [name] Other Clinician: Massie Kluver Referring Larence Thone: [name] Treating Daniel Ritthaler/Extender: Skipper Cliche in Treatment: 50 Encounter Discharge Information Items Discharge Condition: Stable Ambulatory  Status: Wheelchair Discharge Destination: Home Transportation: Private Auto Accompanied By: daughter Schedule Follow-up Appointment: Yes Clinical Summary of Care: Electronic Signature(s) Signed: 09/23/2021 4:15:07 PM By: Massie Kluver Entered By: Massie Kluver on 09/23/2021 13:56:57 Sparacino, Aleksis L. ([id number]) -------------------------------------------------------------------------------- Lower Extremity Assessment Details Patient Name: [name] [name], [name] Date of Service: 09/23/2021 1:00 PM Medical Record Number: [id number] Patient Account Number: [id number] Date of Birth/Sex: 04-Mar-1926 (88 y.o. F) Treating RN: Cornell Barman Primary Care Magdalen Cabana: [name] Other Clinician: Massie Kluver Referring Edson Deridder: [name] Treating Luva Metzger/Extender: Skipper Cliche in Treatment: 6 Electronic Signature(s) Signed: 09/23/2021 4:15:07 PM By: Massie Kluver Signed: 09/25/2021 3:21:50 PM By: Gretta Cool, BSN, RN, CWS, Kim RN, BSN Entered By: Massie Kluver on 09/23/2021 13:27:03 Kreitzer, Remmy L. ([id number]) -------------------------------------------------------------------------------- Multi Wound Chart Details Patient Name: [name] [name], [name] Date of Service: 09/23/2021 1:00 PM Medical Record Number: [id number] Patient Account Number: [id number] Date of Birth/Sex: 22-Oct-1926 (88 y.o. F) Treating RN: Cornell Barman Primary Care Arlissa Monteverde: [name] Other Clinician: Massie Kluver Referring Camrin Gearheart: [name] Treating Aaron Bostwick/Extender: Skipper Cliche in Treatment: 64 Vital Signs Height(in): 18 Pulse(bpm): 52 Weight(lbs): 137 Blood Pressure(mmHg): 162/79 Body Mass Index(BMI): 28.6 Temperature(F): 98.3 Respiratory Rate(breaths/min): 16 Photos: Wound Location: Left, Midline Foot Left, Lateral Foot Right Forearm Wounding Event: Pressure Injury Gradually Appeared Trauma Primary Etiology: Pressure Ulcer Pressure Ulcer Trauma, Other Comorbid History: Cataracts, Anemia,  Coronary Artery Cataracts, Anemia, Coronary Artery Cataracts, Anemia, Coronary Artery Disease, Hypertension, Myocardial Disease, Hypertension, Myocardial Disease, Hypertension, Myocardial Infarction, End Stage Renal Infarction, End Stage Renal Infarction, End Stage Renal Disease, Gout, Osteoarthritis, Disease, Gout, Osteoarthritis, Disease, Gout, Osteoarthritis, Dementia Dementia Dementia Date Acquired: 10/02/2020 06/24/2021 09/02/2021 Weeks of Treatment: 50 13 3 Wound Status: Healed - Epithelialized Open Open Wound Recurrence: No No No Measurements L x W x D (cm) 0x0x0 0.1x0.1x0.1 0x0x0 Area (cm) : 0 0.008 0 Volume (cm) : 0 0.001 0 % Reduction in Area: 100.00% 87.30%  100.00% % Reduction in Volume: 100.00% 83.30% 100.00% Classification: Category/Stage III Category/Stage II Full Thickness Without Exposed Support Structures Exudate Amount: Medium Medium None Present Exudate Type: Serosanguineous Serosanguineous N/A Exudate Color: red, brown red, brown N/A Wound Margin: Thickened N/A N/A Granulation Amount: Small (1-33%) Medium (34-66%) Large (67-100%) Granulation Quality: Pink Pink, Pale Red Necrotic Amount: Large (67-100%) Medium (34-66%) None Present (0%) Exposed Structures: Fat Layer (Subcutaneous Tissue): Fat Layer (Subcutaneous Tissue): Fascia: No Yes Yes Fat Layer (Subcutaneous Tissue): Fascia: No No Tendon: No Tendon: No Muscle: No Muscle: No Joint: No Joint: No Bone: No Bone: No Epithelialization: Medium (34-66%) Small (1-33%) Large (67-100%) Treatment Notes Electronic Signature(s) Signed: 09/23/2021 4:15:07 PM By: Massie Kluver Entered By: Massie Kluver on 09/23/2021 13:44:24 [name] [name], [name] ([id number]) [name] [name], [name] ([id number]) -------------------------------------------------------------------------------- [place] Details Patient Name: [name] [name], [name] Date of Service: 09/23/2021 1:00 PM Medical Record Number: [id number] Patient  Account Number: [id number] Date of Birth/Sex: 05-Jul-1926 (88 y.o. F) Treating RN: Cornell Barman Primary Care Rella Egelston: [name] Other Clinician: Massie Kluver Referring Raimundo Corbit: [name] Treating Derin Granquist/Extender: Skipper Cliche in Treatment: 85 Active Inactive Electronic Signature(s) Signed: 09/23/2021 4:15:07 PM By: Massie Kluver Signed: 09/25/2021 3:21:50 PM By: Gretta Cool, BSN, RN, CWS, Kim RN, BSN Entered By: Massie Kluver on 09/23/2021 13:44:17 [name] [name], [name] ([id number]) -------------------------------------------------------------------------------- Pain Assessment Details Patient Name: [name] [name], [name] Date of Service: 09/23/2021 1:00 PM Medical Record Number: [id number] Patient Account Number: [id number] Date of Birth/Sex: 12/23/1926 (88 y.o. F) Treating RN: Cornell Barman Primary Care Oluwademilade Kellett: [name] Other Clinician: Massie Kluver Referring Haidan Nhan: [name] Treating Gelena Klosinski/Extender: Skipper Cliche in Treatment: 64 Active Problems Location of Pain Severity and Description of Pain Patient Has Paino No Site Locations Pain Management and Medication Current Pain Management: Electronic Signature(s) Signed: 09/23/2021 4:15:07 PM By: Massie Kluver Signed: 09/25/2021 3:21:50 PM By: Gretta Cool, BSN, RN, CWS, Kim RN, BSN Entered By: Massie Kluver on 09/23/2021 13:14:18 [name] [name], [name] ([id number]) -------------------------------------------------------------------------------- Patient/Caregiver Education Details Patient Name: [name] [name], Chenell L. Date of Service: 09/23/2021 1:00 PM Medical Record Number: [id number] Patient Account Number: [id number] Date of Birth/Gender: [birth year] (88 y.o. F) Treating RN: Cornell Barman Primary Care Physician: [name] Other Clinician: Massie Kluver Referring Physician: [name] Treating Physician/Extender: Skipper Cliche in Treatment: 66 Education Assessment Education Provided To: Patient Education  Topics Provided Wound/Skin Impairment: Handouts: Other: continue wound care as directed Methods: Explain/Verbal Responses: State content correctly Electronic Signature(s) Signed: 09/23/2021 4:15:07 PM By: Massie Kluver Entered By: Massie Kluver on 09/23/2021 13:56:18 [place], [place] ([id number]) -------------------------------------------------------------------------------- Wound Assessment Details Patient Name: Nylund, Selene L. Date of Service: 09/23/2021 1:00 PM Medical Record Number: [id number] Patient Account Number: [id number] Date of Birth/Sex: 04-20-1926 (88 y.o. F) Treating RN: Cornell Barman Primary Care Rondel Episcopo: [name] Other Clinician: Massie Kluver Referring Agape Hardiman: [name] Treating Jerilyn Gillaspie/Extender: Skipper Cliche in Treatment: 64 Wound Status Wound Number: 3 Primary Pressure Ulcer Etiology: Wound Location: Left, Midline Foot Wound Healed - Epithelialized Wounding Event: Pressure Injury Status: Date Acquired: 10/02/2020 Comorbid Cataracts, Anemia, Coronary Artery Disease, Weeks Of Treatment: 50 History: Hypertension, Myocardial Infarction, End Stage Renal Clustered Wound: No Disease, Gout, Osteoarthritis, Dementia Photos Wound Measurements Length: (cm) 0 % Re Width: (cm) 0 % Re Depth: (cm) 0 Epit Area: (cm) 0 Volume: (cm) 0 duction in Area: 100% duction in Volume: 100% helialization: Medium (34-66%) Wound Description Classification: Category/Stage III Fou Wound Margin: Thickened Slo Exudate Amount: Medium Exudate Type: Serosanguineous Exudate Color: red, brown l Odor After Cleansing: No  ugh/Fibrino Yes Wound Bed Granulation Amount: Small (1-33%) Exposed Structure Granulation Quality: Pink Fascia Exposed: No Necrotic Amount: Large (67-100%) Fat Layer (Subcutaneous Tissue) Exposed: Yes Tendon Exposed: No Muscle Exposed: No Joint Exposed: No Bone Exposed: No Treatment Notes Wound #3 (Foot) Wound Laterality: Left,  Midline Cleanser Peri-Wound Care Topical Briner, Bertrice L. ([id number]) Primary Dressing Secondary Dressing Secured With Compression Wrap Compression Stockings Add-Ons Electronic Signature(s) Signed: 09/23/2021 4:15:07 PM By: Massie Kluver Signed: 09/25/2021 3:21:50 PM By: Gretta Cool, BSN, RN, CWS, Kim RN, BSN Entered By: Massie Kluver on 09/23/2021 13:43:12 Masella, Debar L. ([id number]) -------------------------------------------------------------------------------- Wound Assessment Details Patient Name: Zurita, Shaneequa L. Date of Service: 09/23/2021 1:00 PM Medical Record Number: [id number] Patient Account Number: [id number] Date of Birth/Sex: October 21, 1926 (88 y.o. F) Treating RN: Cornell Barman Primary Care Shomari Scicchitano: [name] Other Clinician: Massie Kluver Referring Fredy Gladu: [name] Treating Lakhia Gengler/Extender: Skipper Cliche in Treatment: 64 Wound Status Wound Number: 6 Primary Pressure Ulcer Etiology: Wound Location: Left, Lateral Foot Wound Open Wounding Event: Gradually Appeared Status: Date Acquired: 06/24/2021 Comorbid Cataracts, Anemia, Coronary Artery Disease, Weeks Of Treatment: 13 History: Hypertension, Myocardial Infarction, End Stage Renal Clustered Wound: No Disease, Gout, Osteoarthritis, Dementia Photos Wound Measurements Length: (cm) 0.1 Width: (cm) 0.1 Depth: (cm) 0.1 Area: (cm) 0.008 Volume: (cm) 0.001 % Reduction in Area: 87.3% % Reduction in Volume: 83.3% Epithelialization: Small (1-33%) Wound Description Classification: Category/Stage II Exudate Amount: Medium Exudate Type: Serosanguineous Exudate Color: red, brown Foul Odor After Cleansing: No Slough/Fibrino Yes Wound Bed Granulation Amount: Medium (34-66%) Exposed Structure Granulation Quality: Pink, Pale Fat Layer (Subcutaneous Tissue) Exposed: Yes Necrotic Amount: Medium (34-66%) Necrotic Quality: Adherent Slough Treatment Notes Wound #6 (Foot) Wound Laterality: Left,  Lateral Cleanser Normal Saline Discharge Instruction: Wash your hands with soap and water. Remove old dressing, discard into plastic bag and place into trash. Cleanse the wound with Normal Saline prior to applying a clean dressing using gauze sponges, not tissues or cotton balls. Do not scrub or use excessive force. Pat dry using gauze sponges, not tissue or cotton balls. Peri-Wound Care Topical Shed, Shataya L. ([id number]) Gentamicin Discharge Instruction: Apply as directed by Eileen Kangas. Primary Dressing Hydrofera Blue Ready Transfer Foam, 2.5x2.5 (in/in) Discharge Instruction: Apply Hydrofera Blue Ready to wound bed as directed Secondary Dressing (SILICONE BORDER) Zetuvit Plus SILICONE BORDER Dressing 4x4 (in/in) Discharge Instruction: Please do not put silicone bordered dressings under wraps. Use non-bordered dressing only. Secured With Tubigrip Size D, 3x10 (in/yd) Compression Wrap Compression Stockings Add-Ons Electronic Signature(s) Signed: 09/23/2021 4:15:07 PM By: Massie Kluver Signed: 09/25/2021 3:21:50 PM By: Gretta Cool, BSN, RN, CWS, Kim RN, BSN Entered By: Massie Kluver on 09/23/2021 13:25:38 Tierce, Katlynn L. ([id number]) -------------------------------------------------------------------------------- Wound Assessment Details Patient Name: Fina, Diera L. Date of Service: 09/23/2021 1:00 PM Medical Record Number: [id number] Patient Account Number: [id number] Date of Birth/Sex: 19-Sep-1926 (88 y.o. F) Treating RN: Cornell Barman Primary Care Ewen Varnell: [name] Other Clinician: Massie Kluver Referring Kama Cammarano: [name] Treating Lihanna Biever/Extender: Skipper Cliche in Treatment: 64 Wound Status Wound Number: 8 Primary Trauma, Other Etiology: Wound Location: Right Forearm Wound Open Wounding Event: Trauma Status: Date Acquired: 09/02/2021 Comorbid Cataracts, Anemia, Coronary Artery Disease, Weeks Of Treatment: 3 History: Hypertension, Myocardial Infarction,  End Stage Renal Clustered Wound: No Disease, Gout, Osteoarthritis, Dementia Photos Wound Measurements Length: (cm) 0 Width: (cm) 0 Depth: (cm) 0 Area: (cm) 0 Volume: (cm) 0 % Reduction in Area: 100% % Reduction in Volume: 100% Epithelialization: Large (67-100%) Wound Description Classification: Full Thickness Without Exposed Support Structure Exudate Amount:  None Present s Foul Odor After Cleansing: No Slough/Fibrino No Wound Bed Granulation Amount: Large (67-100%) Exposed Structure Granulation Quality: Red Fascia Exposed: No Necrotic Amount: None Present (0%) Fat Layer (Subcutaneous Tissue) Exposed: No Tendon Exposed: No Muscle Exposed: No Joint Exposed: No Bone Exposed: No Electronic Signature(s) Signed: 09/23/2021 4:15:07 PM By: Massie Kluver Signed: 09/25/2021 3:21:50 PM By: Gretta Cool, BSN, RN, CWS, Kim RN, BSN Entered By: Massie Kluver on 09/23/2021 13:26:40 Hittle, Markeesha L. ([id number]) -------------------------------------------------------------------------------- Vitals Details Patient Name: Fitzner, Clarabelle L. Date of Service: 09/23/2021 1:00 PM Medical Record Number: [id number] Patient Account Number: [id number] Date of Birth/Sex: [birth year] (88 y.o. F) Treating RN: Cornell Barman Primary Care Cherae Marton: [name] Other Clinician: Massie Kluver Referring Lyndel Sarate: [name] Treating Kaytie Ratcliffe/Extender: Skipper Cliche in Treatment: 64 Vital Signs Time Taken: 13:11 Temperature (F): 98.3 Height (in): 58 Pulse (bpm): 71 Weight (lbs): 137 Respiratory Rate (breaths/min): 16 Body Mass Index (BMI): 28.6 Blood Pressure (mmHg): 162/79 Reference Range: 80 - 120 mg / dl Electronic Signature(s) Signed: 09/23/2021 4:15:07 PM By: Massie Kluver Entered By: Massie Kluver on 09/23/2021 13:14:12

## 2021-09-27 ENCOUNTER — Ambulatory Visit: Payer: PPO | Admitting: Podiatry

## 2021-09-27 DIAGNOSIS — M79676 Pain in unspecified toe(s): Secondary | ICD-10-CM

## 2021-09-27 DIAGNOSIS — B351 Tinea unguium: Secondary | ICD-10-CM | POA: Diagnosis not present

## 2021-09-27 NOTE — Progress Notes (Signed)
   Chief Complaint  Patient presents with   foot care    Foot care    SUBJECTIVE Patient presents to office today complaining of elongated, thickened nails that cause pain while ambulating in shoes.  Patient is unable to trim their own nails.  Patient does have chronic ulcers to the bilateral feet that is being managed at the wound care center weekly.  Dressings were left intact today.  Patient is here for routine foot care  Past Medical History:  Diagnosis Date   Hyperlipidemia    Hypertension    Myocardial infarction [facility])     OBJECTIVE General Patient is awake, alert, and oriented x 3 and in no acute distress. Derm Skin is dry and supple bilateral. Negative open lesions or macerations. Remaining integument unremarkable. Nails are tender, long, thickened and dystrophic with subungual debris, consistent with onychomycosis, 1-5 bilateral. No signs of infection noted.  Dressings to the bilateral wounds were left intact today. Vasc  DP and PT pedal pulses palpable bilaterally. Temperature gradient within normal limits.  Neuro Epicritic and protective threshold sensation grossly intact bilaterally.  Musculoskeletal Exam No symptomatic pedal deformities noted bilateral. Muscular strength within normal limits.  ASSESSMENT 1.  Pain due to onychomycosis of toenails both 2.  Chronic wounds bilateral feet being managed at the wound care center  PLAN OF CARE 1. Patient evaluated today.  2. Instructed to maintain good pedal hygiene and foot care.  3. Mechanical debridement of nails 1-5 bilaterally performed using a nail nipper. Filed with dremel without incident.  4. Return to clinic in 3 mos.    Edrick Kins, DPM [facility]  Dr. Edrick Kins, DPM    [address] [place], [place] [address]                Office [phone]  Fax [phone]

## 2021-10-03 ENCOUNTER — Other Ambulatory Visit: Payer: PPO | Admitting: Student

## 2021-10-03 DIAGNOSIS — F419 Anxiety disorder, unspecified: Secondary | ICD-10-CM | POA: Diagnosis not present

## 2021-10-03 DIAGNOSIS — Z515 Encounter for palliative care: Secondary | ICD-10-CM

## 2021-10-03 DIAGNOSIS — S51812D Laceration without foreign body of left forearm, subsequent encounter: Secondary | ICD-10-CM | POA: Diagnosis not present

## 2021-10-03 DIAGNOSIS — F0150 Vascular dementia without behavioral disturbance: Secondary | ICD-10-CM | POA: Diagnosis not present

## 2021-10-03 DIAGNOSIS — L89899 Pressure ulcer of other site, unspecified stage: Secondary | ICD-10-CM | POA: Diagnosis not present

## 2021-10-03 NOTE — Progress Notes (Signed)
Therapist, nutritional Palliative Care Consult Note Telephone: [phone]  Fax: [phone]    Date of encounter: 10/03/21 11:37 AM PATIENT NAME: [name] [name] [address] [place] [address]   [phone] (home)  DOB: [birth year] MRN: [id number] PRIMARY CARE PROVIDER:    Malva Limes, MD,  [address] [place] [address] [place] [address] [phone]  REFERRING PROVIDER:   Malva Limes, MD [address] [place] [address] [place],  [place] [address] [phone]  RESPONSIBLE PARTY:    Contact Information     Name Relation Home Work Mobile   Reich,Jeanie Daughter [phone]  [phone]        I met face to face with patient and family in the home. Palliative Care was asked to follow this patient by consultation request of  Fisher, Demetrios Isaacs, MD to address advance care planning and complex medical decision making. This is a follow up visit.                                   ASSESSMENT AND PLAN / RECOMMENDATIONS:   Advance Care Planning/Goals of Care: Goals include to maximize quality of life and symptom management. Patient/health care surrogate gave his/her permission to discuss. Our advance care planning conversation included a discussion about:    The value and importance of advance care planning  Experiences with loved ones who have been seriously ill or have died  Exploration of personal, cultural or spiritual beliefs that might influence medical decisions  Exploration of goals of care in the event of a sudden injury or illness  CODE STATUS: DNR  Palliative Medicine will continue to provide supportive care, monitor for changes and declines, provide symptom management as needed.   Symptom Management/Plan:  Vascular dementia- patient requires assistance with all adl's. She requires assist x 1 for transfers; daughter encouraged to use gait belt. Family to continue providing supportive care. Patient with good appetite. Right mid arm  circumference is 22.5 cm. She is unable to stand to obtain weight. Will monitor for loss of muscle mass. Monitor for further cognitive changes and declines. Patient has noted to be scratching at night; possibly anxiety related. Recommend hydroxyzine 25 mg 1/2 tab QHS PRN.   Wound to left lateral foot- patient now with one wound, which is almost healed. Continue wound care as directed; follow up with wound center every 3 weeks.   Superficial skin tears-patient with skin tears to left forearm; sustained when going to appointment. Patient with thin skin, bruises easily. Recommend geri sleeves for protection.   Follow up Palliative Care Visit: Palliative care will continue to follow for complex medical decision making, advance care planning, and clarification of goals. Return in 8 weeks or prn.   This visit was coded based on medical decision making (MDM).  PPS: 40%  HOSPICE ELIGIBILITY/DIAGNOSIS: TBD  Chief Complaint: Palliative Medicine follow up visit.   HISTORY OF PRESENT ILLNESS:  [name] [name] is a 88 y.o. year old female  with vascular dementia, CAD, hypertension, PVD, DVT of right lower extremity- no longer on Eliquis, GERD, hypothyroidism, CKD 4, gout.   Patient has been stable per daughter. Her communication varies. She is feeding self, mostly finger foods. Appetite is good. No falls recently. She is still going to wound center; she has one open wound to left foot and going to wound center every 3 weeks. Sustained skin tears recently when going to appointment.  She requires assistance x 1 for transfers. Patient answers some yes/no questions. HPI and ROS primarily obtained per daughter d/t her dementia.  History obtained from review of EMR, discussion with primary team, and interview with family, facility staff/caregiver and/or Ms. Hogsett.  I reviewed available labs, medications, imaging, studies and related documents from the EMR.  Records reviewed and summarized above.   Physical  Exam: Pulse 78, resp 18, b/p 152/80, sats 98% on room air Constitutional: NAD General: frail appearing, thin EYES: anicteric sclera, lids intact, no discharge  ENMT: intact hearing, oral mucous membranes moist, dentures CV: S1S2, RRR, no LE edema Pulmonary: LCTA, no increased work of breathing, no cough, room air Abdomen: normo-active BS + 4 quadrants, soft and non tender GU: deferred MSK: sarcopenia, non-ambulatory Skin: cool and dry, skin tears to left forearm; bandaid in place. Left foot not visualized Neuro: + generalized weakness, A & O to person, familiars Psych: non-anxious affect, pleasant Hem/lymph/immuno: no widespread bruising   Thank you for the opportunity to participate in the care of Ms. Costilla.  The palliative care team will continue to follow. Please call our office at [phone] if we can be of additional assistance.   Ezekiel Slocumb, NP   COVID-19 PATIENT SCREENING TOOL Asked and negative response unless otherwise noted:   Have you had symptoms of covid, tested positive or been in contact with someone with symptoms/positive test in the past 5-10 days? No

## 2021-10-09 DIAGNOSIS — F0150 Vascular dementia without behavioral disturbance: Secondary | ICD-10-CM | POA: Diagnosis not present

## 2021-10-14 ENCOUNTER — Encounter: Payer: PPO | Admitting: Physician Assistant

## 2021-10-14 DIAGNOSIS — L97522 Non-pressure chronic ulcer of other part of left foot with fat layer exposed: Secondary | ICD-10-CM | POA: Diagnosis not present

## 2021-10-14 DIAGNOSIS — S81811A Laceration without foreign body, right lower leg, initial encounter: Secondary | ICD-10-CM | POA: Diagnosis not present

## 2021-10-14 NOTE — Progress Notes (Addendum)
Lamy, [name] ([id number]) Visit Report for 10/14/2021 Chief Complaint Document Details Patient Name: [name] [name], [name] Date of Service: 10/14/2021 1:30 PM Medical Record Number: [id number] Patient Account Number: [id number] Date of Birth/Sex: [birth year] (88 y.o. F) Treating RN: Cornell Barman Primary Care Provider: [name] Other Clinician: Massie Kluver Referring Provider: [name] Treating Provider/Extender: Skipper Cliche in Treatment: 67 Information Obtained from: Patient Chief Complaint Left LE Ulcers on leg and foot and skin tear right arm Electronic Signature(s) Signed: 10/14/2021 1:20:07 PM By: Worthy Keeler PA-C Entered By: Worthy Keeler on 10/14/2021 13:20:06 [name] [name], [name] ([id number]) -------------------------------------------------------------------------------- HPI Details Patient Name: [name] [name], [name] Date of Service: 10/14/2021 1:30 PM Medical Record Number: [id number] Patient Account Number: [id number] Date of Birth/Sex: April 02, 1926 (88 y.o. F) Treating RN: Cornell Barman Primary Care Provider: [name] Other Clinician: Massie Kluver Referring Provider: [name] Treating Provider/Extender: Skipper Cliche in Treatment: 54 History of Present Illness HPI Description: 88 year old patient was recently seen by the PCPs office for significant pain right great toe which has been going on since July. She was initially treated with Keflex which she did not complete and after the office visit this time she has been put on doxycycline. at the time of her visit she was found to have a ulcer on the plantar surface of the right great toe and also had a pyogenic granuloma over this area. X-ray of the right foot done 12/29/2014 -- IMPRESSION:Soft-tissue swelling and ulceration right great toe. No underlying bony lytic lesion identified. If osteomyelitis remains of clinical concern MRI can be obtained. Past medical history significant for anemia,  chronic kidney disease stage III, obesity, varicose veins, coronary artery disease, gout, history of nicotine addiction given up smoking in 2002, hypertension, status post cardiac catheterization, status post abdominal hysterectomy, cholecystectomy and tonsillectomy. hemoglobin A1c done in August was 5.8 01/22/2015 -- at this stage the [facility] boat was going to cost them significant amount of money and they want to defer using that at the present time. 01/29/2015 -- she had a podiatry appointment and they have trimmed her toenails. She has not heard back from the vascular office regarding her venous duplex study and I have asked them to call personally so that they can get the appointment soon. 02/06/2015 -- they have made contact with the vascular office and from what I understand that test has been done but the report is pending. 02/19/2014 -- the vascular test is scheduled for tomorrow Readmission: 06/26/2020 upon evaluation today patient appears for initial evaluation in her clinic that she has been here before in 2016 and has been quite sometime. She did have a fractured hip in February on the 23rd 2022. Subsequently this had to be pinned and she ended up with a pressure injury on her heel following when she was using her foot to help move her around in the bed. Subsequently this has led to the wound that she has been dealing with since that time. She lives at home with her daughter currently she does have dementia. 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 07/03/2020 upon evaluation today patient appears to be doing well with regard to her heel ulcer. She did have arterial studies they appear to be doing excellent it was premature normal across the board with TBI's in the 90s and ABIs well within normal range. Nonetheless there does not appear to  be any signs  of arterial insufficiency whatsoever. With that being said the patient does have also signs of improvement there is some necrotic tissue in the base of the wound number to try to clear some of that away today I do believe the Iodoflex/Iodosorb is doing well. 5/24; difficult punched out wound on the left medial heel. She has been using Iodoflex 07/17/2020 upon evaluation today patient appears to be doing well at this point in regard to her wound. I do feel like this is a little bit deeper but again that is what is expected as we continue with the Iodoflex I think that this is just going to get deeper until we get to the base of the wound. With that being said I think we are getting much closer to the base of the wound where we can have healthier tissue that we get a be managing here which that will be awesome. In the meantime I am not surprised by what I am seeing and in fact the wound appears to be better as compared to previous findings. 07/24/2020 upon evaluation today patient appears to be doing well with regard to her wound. Overall I am extremely pleased with where things stand today. I do not see any signs of active infection which is great and overall I think that the patient is making good progress. There is good to be some need for sharp debridement today. 07/31/2020 upon evaluation today patient appears to be doing better in regard to her heel ulcer. She has been tolerating the dressing changes without complication. Fortunately there does not appear to be any signs of active infection which is great and overall very pleased in this regard. No fevers, chills, nausea, vomiting, or diarrhea. 08/14/2020 upon evaluation today patient appears to be doing better in regard to her heel ulcer. I am very pleased in that regard. Unfortunately she still has a slight deep tissue injury in regard to the medial portion of her foot over the same area. Unfortunately I think this is something that if were not  careful it was can open up into the wound. That is my main concern here based on what I see. Fortunately there does not appear to be any evidence of active infection which is great news and overall very pleased with where things stand at this point. 08/21/2020 upon evaluation today patient appears to be doing well with regard to her wound. She has been tolerating the dressing changes without complication. Fortunately there does not appear to be any signs of active infection at this time. No fevers, chills, nausea, vomiting, or diarrhea. I do believe the compression wrap was beneficial for her. 08/28/2020 upon evaluation today patient appears to be doing well with regard to her wounds currently. Fortunately there does not appear to be any signs of active infection at this time. No fevers, chills, nausea, vomiting, or diarrhea. With that being said I think that her leg is doing quite well to be honest. 09/04/2020 upon evaluation today patient appears to be doing well with regard to her wound on the heel. This is showing signs of good epithelial growth although there is probably can it be an indention where this heals that is okay as long as we get it closed. Fortunately I do not see any evidence of infection at this point. 09/13/2020 upon evaluation today patient appears to be doing well with regard to her wounds. She has been tolerating the dressing changes Carlton, Nate L. ([id number]) without complication. Fortunately  he is actually doing extremely well and I think she is making great progress this is measuring smaller. I would recommend that such that we continue with the [facility] likely since he is doing so well. 09/18/2020 upon evaluation today patient appears to be doing well with regard to her heel ulcer. She is making good progress and I am very pleased with what we see today. I think the Hydrofera Blue is still doing excellent. 10/02/2020 upon evaluation today patient's wound actually appears  to be showing signs of good improvement in regard to the heel. I am very pleased in this regard. With that being said I do believe that the area which is somewhat been stable and dry is starting to lift up and beginning to clear this away as well that is on the foot. Subsequently I am going to have to perform some debridement here and we did actually go ahead and allow this as a wound today as before it was just more deep tissue injury and eschar covering now I think it is a little bit more than that to be honest. 10/09/2020 upon evaluation today patient appears to be doing decently well in regard to her wounds. I am actually very pleased with where things stand today. There does not appear to be any signs of active infection which is great news. No fevers, chills, nausea, vomiting, or diarrhea. She is going require some sharp debridement today. 10/16/2020 upon evaluation today patient appears to be doing decently well in regard to her heel as well as the foot. Both are showing signs of good improvement which is great news. In general and extremely pleased with where things stand at this point. She is tolerating the [facility] well although this is getting so small in the heel I think collagen may be a better option here based on what I am seeing. With regard to the foot the Iodoflex/Iodosorb is still probably the best method here. 10/26/2020 upon evaluation today patient actually appears to be doing well in some respects today. She has been tolerating the dressing changes without complication and this is great news. The Hydrofera Blue is done well for the heel this actually appears to be healed today. With that being said in regard to the foot I think we are ready to switch to [facility] based on what I see at this point. 10/30/2020 upon evaluation today patient appears to be doing well with regard to his wound. He has been tolerating the dressing changes without complication. Fortunately there  does not appear to be any signs of active infection systemically at this time which is great news and overall very pleased with where things stand today. I do think that she is making progress although it somewhat slow still where showing signs of improvement little by little here. 11/06/2020 upon evaluation today patient appears to be doing decently well in regard to her wound. We will start and see better overall appearance of the base of the wound which is great news she still continues to have some abnormal fluorescence signals with a MolecuLight DX that will be detailed below. 11/20/2020 upon evaluation today patient's wound actually showing signs of improvement. There is good to be some need for sharp debridement today and that was discussed with the patient. I am going to go ahead and clean this up but I do believe the [facility] is doing a great job. 11/27/2020; the patient had eschar over the original heel wound which I removed with  a #3 curette there was nothing open here. The area is on the lateral left foot foot. 12/04/2020 upon evaluation today patient appears to be doing well with regard to her wound. The wound bed actually appeared to be doing well after I remove the dry endoform from the wound bed. This I tracked little bit of fluid but nonetheless underneath this appears to be doing awesome. I am very pleased with where things stand today. No fevers, chills, nausea, vomiting, or diarrhea. 12/11/2020 upon evaluation today patient appears to be doing well with regard to her wound although it keeps getting dry and filled up with the endoform I am not seeing any signs of infection but we are going to have to clean this away in order to try and get things moving in an appropriate direction. I discussed that with patient's daughter today as well. She is in agreement with proceeding as such. 12/21/2020 upon evaluation today patient appears to be doing well with regard to her wound this did  not seal off like it was with the collagen but nonetheless also did not really feeling quite as much as I would like to have seen. I do believe that we may need to go ahead and see about doing a debridement today and I really feel like we may want to switch back to the [facility] which I felt like was doing better in the past. Good news is the base of the wound appears healthy with good granulation tissue. 12/28/2020 upon evaluation patient's wound actually showed signs of doing quite well in regard to her foot. I think that we getting very close to complete resolution and overall I am extremely happy with where things stand today. 01/04/2021 upon evaluation today patient appears to be doing well with regard to her wound on the foot. This is good to require little bit of debridement today but overall seems to be doing quite well. I am actually very pleased with where we stand currently. 01/08/2021 upon evaluation today patient's wound actually showing signs of some improvement although she still has some callus buildup around the edges of the wound unfortunately I think this is continuing to rub despite what we are doing currently. I think that we may need to go ahead and see about switching up the dressing a little bit I Minna recommend collagen and then on top of the collagen we will get a use some foam double layered cut in a doughnut to try to take pressure off of this area we will see how this does over the next week. 01/15/2021 upon evaluation today patient's wound is actually showing signs of being completely dried over with the collagen. This is definitely not what we are looking for. I think that the wound dried out too quickly is the main issue that we are running into at this point and I really think we may need to do something else try to keep that from occurring. 01/22/2021 upon evaluation today patient's wound actually did stay open it did not callus over as its been doing in the past  this is good news. Also feel like it may not be quite as deep as what we have previously seen. There is still not as much improvement he does what I would like to see but nonetheless I think we are headed in the right direction. 01/29/2021 upon evaluation today patient appears to be doing okay in regard to her wound is not too dry but unfortunately is too wet the  Xeroform just did not do what I was hoping that she made things a little bit worse as far as size wise I am actually going to try at this point considering this is more open but not as deep the Hydrofera Blue to see if this will be beneficial at this time. Again we seem to be cycling through things and it is becoming a little difficult to get this to completely close but in the past Hydrofera Blue is what she did the best with 1 week to keep it from closing up prematurely. 02/05/2021 upon evaluation today patient's wound actually showing some signs of improvement which is great news. Fortunately I do not see any evidence of active infection locally nor systemically. It is definitely not too wet which is been the biggest issue we have seen up to this point over the past several weeks. Hauschild, Julisa L. ([id number]) 02/21/2021 upon evaluation today patient appears to be doing excellent in regard to her wound. This actually appears much improved compared to last time I saw her which is great news as well. No fevers, chills, nausea, vomiting, or diarrhea. 02/28/2021 upon evaluation today patient's wound on her foot actually appears to be doing quite well. I am very pleased with where things stand today. I do not see any evidence of active infection locally nor systemically at this time. No fevers, chills, nausea, vomiting, or diarrhea. 03/14/2021 upon evaluation today patient appears to be doing well with regard to her wound. There is a little bit of a limp on the plantar edge of the wound opening which I Minna see about trying to clear away today.  Fortunately I think other than this everything seems to be showing signs of improvement measurements still are roughly the same. It is definitely not as deep however. 03/28/2021 upon evaluation today patient appears to be doing well with regard to her wound. There is still some slough noted in the base of the wound. Fortunately there does not appear to be any evidence of active infection locally nor systemically at this time which is good news. 04/11/2021 upon evaluation today patient appears to be doing decently well in regard to her foot ulcer. This is showing a little bit of moisture buildup but fortunately nothing too significant I think the wound is looking better. This is just very slow to heal. Fortunately I do not see any evidence of active infection at this time. 04/18/2021 upon evaluation today patient appears to be doing well with regard to her ulcer. In fact this is getting very close to completely resolved. I am actually extremely pleased with where things stand today and how this appears. 05/02/2021 upon evaluation patient appears to be doing really about the same in regard to her wound I do not see any signs of significant worsening which is great news and overall very pleased in that regard. With that being said I do feel like that the patient is continuing to have really stalling of the wound she also has a couple areas that appear to be deep tissue injuries that are somewhat unfortunate to be perfectly honest. With that being said I do see evidence here in general of improvement which is good with regard to the depth of the wound we just need to try to protect her legs and hopefully her feet while allowing this last little bit to heal. Patient's daughter is doing an excellent job taking care of her and I definitely do not see any issues in that regard  at all she is very diligent in trying to keep pressure off as far as the feet are concerned. Nonetheless I do believe the patient still  despite all of this is still getting several areas of deep tissue injury which raises the question as to why is this a blood flow issue or something else in the past she really has done well and had no significant blood flow limitations with her ABIs of May 2020 to be excellent and normal. We will see how things progress and if we need to repeat this we will definitely do so. 05/09/2021 upon evaluation today patient unfortunately appears to not be doing quite as well as what I previously saw. In fact her feet seem to be showing signs of breakdown at several locations as well as the wound. In general she has not been eating or drinking well I am kind of concerned that she may be shutting down to some degree. 05-23-2021 on evaluation today patient appears to be doing well all things considered with regard to her wound. She does have a little bit of erythema around the medial foot ulcer none of the other deep tissue injuries have open which is good news. Fortunately I see no evidence of active infection locally nor systemically at this time which is also great news. No fevers, chills, nausea, vomiting, or diarrhea. 06-06-2021 upon evaluation today patient appears to be doing better in regard to her foot ulcers. Everything is actually showing signs of improvement which is great news. Fortunately I do not see any signs of active infection locally nor systemically which is great news. In fact her overall coloring of her foot seems better and she is also not having as much pain as she was. Her blood pressure is also more stable and had been dropping very low obviously I think that that is probably a big part of why things are looking better. 4/27; using Hydrofera Blue and 2 wounds on the left foot on the tip of the left great toe and on the left medial foot 5/8; the patient however has 3 wounds on the left foot. 1 at the tip of the left great toe, 1 medially on the left foot and another on the lateral  fifth metatarsal head. Using Hydrofera Blue in all wounds. She does not complain of pain. 07-01-2021 upon evaluation today patient appears to be doing well with regard to her wounds everything is appearing to do well no signs of infection in general and very pleased with where we stand today. There does not appear to be any signs of active infection locally or systemically which is great news. No fevers, chills, nausea, vomiting, or diarrhea. 07-22-2021 upon evaluation today patient appears to be doing well in general in regard to her wound she does have a new wound on the right shin this is due to a trauma that is a small skin tear. This happened in the past week. With that being said overall she seems to be doing quite well I do not see any evidence of a infection which is great news. No fevers, chills, nausea, vomiting, or diarrhea. 08-12-2021 upon evaluation today patient appears to be doing well with regard to her feet. Overall the wound on the right leg is healed the wound on the left foot area at all locations seem to be doing okay I do not see any signs of obvious active infection at this time which is great news. 09-02-2021 upon evaluation today patient appears to be  doing well currently in regard to her wounds on the legs specifically her feet. She has been tolerating the dressing changes without complication. Good news is she seems to be tolerating the dressing changes without any complication and is doing much better the only issue she has she does have a skin tear on the right forearm that actually happened on the way into the clinic today. 09-23-2021 upon evaluation today patient appears to be doing well currently in regard to her wounds in fact everything is healed except for just 1 remaining area on the lateral aspect of her foot which is very tiny. I am extremely pleased with where things stand today. I do not see any evidence of active infection locally or systemically. 10-14-2021 upon  evaluation today patient appears to be doing well currently in regard to her wounds in fact everything appears to be completely healed which is great news. Electronic Signature(s) Signed: 10/14/2021 2:26:34 PM By: Worthy Keeler PA-C Entered By: Worthy Keeler on 10/14/2021 14:26:34 [name] [name], [name] ([id number]) -------------------------------------------------------------------------------- Physical Exam Details Patient Name: Kamiya, Naylin L. Date of Service: 10/14/2021 1:30 PM Medical Record Number: [id number] Patient Account Number: [id number] Date of Birth/Sex: [birth year] (88 y.o. F) Treating RN: Cornell Barman Primary Care Provider: [name] Other Clinician: Massie Kluver Referring Provider: [name] Treating Provider/Extender: Skipper Cliche in Treatment: 66 Constitutional Well-nourished and well-hydrated in no acute distress. Respiratory normal breathing without difficulty. Psychiatric this patient is able to make decisions and demonstrates good insight into disease process. Alert and Oriented x 3. pleasant and cooperative. Notes Upon inspection patient's wound bed actually showed signs of good granulation and epithelization at this point. Fortunately there does not appear to be any signs of active infection at this time which is great news and overall I am extremely pleased with where we stand today. Electronic Signature(s) Signed: 10/14/2021 2:26:51 PM By: Worthy Keeler PA-C Entered By: Worthy Keeler on 10/14/2021 14:26:51 [name] [name], [name] ([id number]) -------------------------------------------------------------------------------- Physician Orders Details Patient Name: [name] [name], [name] Date of Service: 10/14/2021 1:30 PM Medical Record Number: [id number] Patient Account Number: [id number] Date of Birth/Sex: February 02, 1927 (88 y.o. F) Treating RN: Cornell Barman Primary Care Provider: [name] Other Clinician: Massie Kluver Referring Provider: [name] Treating Provider/Extender: Skipper Cliche in Treatment: 43 Verbal / Phone Orders: No Diagnosis Coding ICD-10 Coding Code Description [phone] Pressure ulcer of other site, stage 3 S81.811A Laceration without foreign body, right lower leg, initial encounter I10 Essential (primary) hypertension F01.50 Vascular dementia without behavioral disturbance I25.10 Atherosclerotic heart disease of native coronary artery without angina pectoris S51.811A Laceration without foreign body of right forearm, initial encounter Discharge From [facility] o Discharge from [place] [facility] - wound healed, please call if any further issues arise o Wear compression garments daily. Put garments on first thing when you wake up and remove them before bed. o Moisturize legs daily after removing compression garments. Electronic Signature(s) Signed: 10/14/2021 5:10:30 PM By: Massie Kluver Signed: 10/14/2021 5:17:38 PM By: Worthy Keeler PA-C Entered By: Massie Kluver on 10/14/2021 14:17:26 [name] [name], [name] ([id number]) -------------------------------------------------------------------------------- Problem List Details Patient Name: [name] [name], [name] Date of Service: 10/14/2021 1:30 PM Medical Record Number: [id number] Patient Account Number: [id number] Date of Birth/Sex: Aug 22, 1926 (88 y.o. F) Treating RN: Cornell Barman Primary Care Provider: [name] Other Clinician: Massie Kluver Referring Provider: [name] Treating Provider/Extender: Skipper Cliche in Treatment: 23 Active Problems ICD-10 Encounter Code Description Active Date MDM Diagnosis L89.893 Pressure  ulcer of other site, stage 3 10/02/2020 No Yes S81.811A Laceration without foreign body, right lower leg, initial encounter 07/22/2021 No Yes I10 Essential (primary) hypertension 06/26/2020 No Yes F01.50 Vascular dementia without behavioral disturbance 06/26/2020 No Yes I25.10 Atherosclerotic heart  disease of native coronary artery without angina 06/26/2020 No Yes pectoris S51.811A Laceration without foreign body of right forearm, initial encounter 09/02/2021 No Yes Inactive Problems Resolved Problems ICD-10 Code Description Active Date Resolved Date L89.623 Pressure ulcer of left heel, stage 3 06/26/2020 06/26/2020 Electronic Signature(s) Signed: 10/14/2021 1:20:03 PM By: Worthy Keeler PA-C Entered By: Worthy Keeler on 10/14/2021 13:20:02 [name] [name], [name] ([id number]) -------------------------------------------------------------------------------- Progress Note Details Patient Name: [name] [name], [name] Date of Service: 10/14/2021 1:30 PM Medical Record Number: [id number] Patient Account Number: [id number] Date of Birth/Sex: October 05, 1926 (88 y.o. F) Treating RN: Cornell Barman Primary Care Provider: [name] Other Clinician: Massie Kluver Referring Provider: [name] Treating Provider/Extender: Skipper Cliche in Treatment: 67 Subjective Chief Complaint Information obtained from Patient Left LE Ulcers on leg and foot and skin tear right arm History of Present Illness (HPI) 88 year old patient was recently seen by the PCPs office for significant pain right great toe which has been going on since July. She was initially treated with Keflex which she did not complete and after the office visit this time she has been put on doxycycline. at the time of her visit she was found to have a ulcer on the plantar surface of the right great toe and also had a pyogenic granuloma over this area. X-ray of the right foot done 12/29/2014 -- IMPRESSION:Soft-tissue swelling and ulceration right great toe. No underlying bony lytic lesion identified. If osteomyelitis remains of clinical concern MRI can be obtained. Past medical history significant for anemia, chronic kidney disease stage III, obesity, varicose veins, coronary artery disease, gout, history of nicotine addiction given up  smoking in 2002, hypertension, status post cardiac catheterization, status post abdominal hysterectomy, cholecystectomy and tonsillectomy. hemoglobin A1c done in August was 5.8 01/22/2015 -- at this stage the [facility] was going to cost them significant amount of money and they want to defer using that at the present time. 01/29/2015 -- she had a podiatry appointment and they have trimmed her toenails. She has not heard back from the vascular office regarding her venous duplex study and I have asked them to call personally so that they can get the appointment soon. 02/06/2015 -- they have made contact with the vascular office and from what I understand that test has been done but the report is pending. 02/19/2014 -- the vascular test is scheduled for tomorrow Readmission: 06/26/2020 upon evaluation today patient appears for initial evaluation in her clinic that she has been here before in 2016 and has been quite sometime. She did have a fractured hip in February on the 23rd 2022. Subsequently this had to be pinned and she ended up with a pressure injury on her heel following when she was using her foot to help move her around in the bed. Subsequently this has led to the wound that she has been dealing with since that time. She lives at home with her daughter currently she does have dementia. 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 07/03/2020 upon evaluation today patient appears to be doing well with regard to her heel ulcer. She did have arterial studies they appear to be doing  excellent it was premature normal across the board with TBI's in the 90s and ABIs well within normal range. Nonetheless there does not appear to be any signs of arterial insufficiency whatsoever. With that being said the patient does have also signs of improvement there is  some necrotic tissue in the base of the wound number to try to clear some of that away today I do believe the Iodoflex/Iodosorb is doing well. 5/24; difficult punched out wound on the left medial heel. She has been using Iodoflex 07/17/2020 upon evaluation today patient appears to be doing well at this point in regard to her wound. I do feel like this is a little bit deeper but again that is what is expected as we continue with the Iodoflex I think that this is just going to get deeper until we get to the base of the wound. With that being said I think we are getting much closer to the base of the wound where we can have healthier tissue that we get a be managing here which that will be awesome. In the meantime I am not surprised by what I am seeing and in fact the wound appears to be better as compared to previous findings. 07/24/2020 upon evaluation today patient appears to be doing well with regard to her wound. Overall I am extremely pleased with where things stand today. I do not see any signs of active infection which is great and overall I think that the patient is making good progress. There is good to be some need for sharp debridement today. 07/31/2020 upon evaluation today patient appears to be doing better in regard to her heel ulcer. She has been tolerating the dressing changes without complication. Fortunately there does not appear to be any signs of active infection which is great and overall very pleased in this regard. No fevers, chills, nausea, vomiting, or diarrhea. 08/14/2020 upon evaluation today patient appears to be doing better in regard to her heel ulcer. I am very pleased in that regard. Unfortunately she still has a slight deep tissue injury in regard to the medial portion of her foot over the same area. Unfortunately I think this is something that if were not careful it was can open up into the wound. That is my main concern here based on what I see. Fortunately there does not  appear to be any evidence of active infection which is great news and overall very pleased with where things stand at this point. 08/21/2020 upon evaluation today patient appears to be doing well with regard to her wound. She has been tolerating the dressing changes without complication. Fortunately there does not appear to be any signs of active infection at this time. No fevers, chills, nausea, vomiting, or diarrhea. I do believe the compression wrap was beneficial for her. 08/28/2020 upon evaluation today patient appears to be doing well with regard to her wounds currently. Fortunately there does not appear to be any signs of active infection at this time. No fevers, chills, nausea, vomiting, or diarrhea. With that being said I think that her leg is doing quite well to be honest. [name] [name], [name] ([id number]) 09/04/2020 upon evaluation today patient appears to be doing well with regard to her wound on the heel. This is showing signs of good epithelial growth although there is probably can it be an indention where this heals that is okay as long as we get it closed. Fortunately I do not see any evidence of infection at this point.  09/13/2020 upon evaluation today patient appears to be doing well with regard to her wounds. She has been tolerating the dressing changes without complication. Fortunately he is actually doing extremely well and I think she is making great progress this is measuring smaller. I would recommend that such that we continue with the [facility] likely since he is doing so well. 09/18/2020 upon evaluation today patient appears to be doing well with regard to her heel ulcer. She is making good progress and I am very pleased with what we see today. I think the Hydrofera Blue is still doing excellent. 10/02/2020 upon evaluation today patient's wound actually appears to be showing signs of good improvement in regard to the heel. I am very pleased in this regard. With that being said  I do believe that the area which is somewhat been stable and dry is starting to lift up and beginning to clear this away as well that is on the foot. Subsequently I am going to have to perform some debridement here and we did actually go ahead and allow this as a wound today as before it was just more deep tissue injury and eschar covering now I think it is a little bit more than that to be honest. 10/09/2020 upon evaluation today patient appears to be doing decently well in regard to her wounds. I am actually very pleased with where things stand today. There does not appear to be any signs of active infection which is great news. No fevers, chills, nausea, vomiting, or diarrhea. She is going require some sharp debridement today. 10/16/2020 upon evaluation today patient appears to be doing decently well in regard to her heel as well as the foot. Both are showing signs of good improvement which is great news. In general and extremely pleased with where things stand at this point. She is tolerating the [facility] well although this is getting so small in the heel I think collagen may be a better option here based on what I am seeing. With regard to the foot the Iodoflex/Iodosorb is still probably the best method here. 10/26/2020 upon evaluation today patient actually appears to be doing well in some respects today. She has been tolerating the dressing changes without complication and this is great news. The Hydrofera Blue is done well for the heel this actually appears to be healed today. With that being said in regard to the foot I think we are ready to switch to [facility] based on what I see at this point. 10/30/2020 upon evaluation today patient appears to be doing well with regard to his wound. He has been tolerating the dressing changes without complication. Fortunately there does not appear to be any signs of active infection systemically at this time which is great news and overall  very pleased with where things stand today. I do think that she is making progress although it somewhat slow still where showing signs of improvement little by little here. 11/06/2020 upon evaluation today patient appears to be doing decently well in regard to her wound. We will start and see better overall appearance of the base of the wound which is great news she still continues to have some abnormal fluorescence signals with a MolecuLight DX that will be detailed below. 11/20/2020 upon evaluation today patient's wound actually showing signs of improvement. There is good to be some need for sharp debridement today and that was discussed with the patient. I am going to go ahead and clean this up but  I do believe the [facility] is doing a great job. 11/27/2020; the patient had eschar over the original heel wound which I removed with a #3 curette there was nothing open here. The area is on the lateral left foot foot. 12/04/2020 upon evaluation today patient appears to be doing well with regard to her wound. The wound bed actually appeared to be doing well after I remove the dry endoform from the wound bed. This I tracked little bit of fluid but nonetheless underneath this appears to be doing awesome. I am very pleased with where things stand today. No fevers, chills, nausea, vomiting, or diarrhea. 12/11/2020 upon evaluation today patient appears to be doing well with regard to her wound although it keeps getting dry and filled up with the endoform I am not seeing any signs of infection but we are going to have to clean this away in order to try and get things moving in an appropriate direction. I discussed that with patient's daughter today as well. She is in agreement with proceeding as such. 12/21/2020 upon evaluation today patient appears to be doing well with regard to her wound this did not seal off like it was with the collagen but nonetheless also did not really feeling quite as much as I  would like to have seen. I do believe that we may need to go ahead and see about doing a debridement today and I really feel like we may want to switch back to the [facility] which I felt like was doing better in the past. Good news is the base of the wound appears healthy with good granulation tissue. 12/28/2020 upon evaluation patient's wound actually showed signs of doing quite well in regard to her foot. I think that we getting very close to complete resolution and overall I am extremely happy with where things stand today. 01/04/2021 upon evaluation today patient appears to be doing well with regard to her wound on the foot. This is good to require little bit of debridement today but overall seems to be doing quite well. I am actually very pleased with where we stand currently. 01/08/2021 upon evaluation today patient's wound actually showing signs of some improvement although she still has some callus buildup around the edges of the wound unfortunately I think this is continuing to rub despite what we are doing currently. I think that we may need to go ahead and see about switching up the dressing a little bit I Minna recommend collagen and then on top of the collagen we will get a use some foam double layered cut in a doughnut to try to take pressure off of this area we will see how this does over the next week. 01/15/2021 upon evaluation today patient's wound is actually showing signs of being completely dried over with the collagen. This is definitely not what we are looking for. I think that the wound dried out too quickly is the main issue that we are running into at this point and I really think we may need to do something else try to keep that from occurring. 01/22/2021 upon evaluation today patient's wound actually did stay open it did not callus over as its been doing in the past this is good news. Also feel like it may not be quite as deep as what we have previously seen. There is  still not as much improvement he does what I would like to see but nonetheless I think we are headed in the right direction.  01/29/2021 upon evaluation today patient appears to be doing okay in regard to her wound is not too dry but unfortunately is too wet the Xeroform just did not do what I was hoping that she made things a little bit worse as far as size wise I am actually going to try at this point considering this is more open but not as deep the Hydrofera Blue to see if this will be beneficial at this time. Again we seem to be cycling through things and it is becoming a little difficult to get this to completely close but in the past Hydrofera Blue is what she did the best with 1 week to keep [name] [name], [name] ([id number]) it from closing up prematurely. 02/05/2021 upon evaluation today patient's wound actually showing some signs of improvement which is great news. Fortunately I do not see any evidence of active infection locally nor systemically. It is definitely not too wet which is been the biggest issue we have seen up to this point over the past several weeks. 02/21/2021 upon evaluation today patient appears to be doing excellent in regard to her wound. This actually appears much improved compared to last time I saw her which is great news as well. No fevers, chills, nausea, vomiting, or diarrhea. 02/28/2021 upon evaluation today patient's wound on her foot actually appears to be doing quite well. I am very pleased with where things stand today. I do not see any evidence of active infection locally nor systemically at this time. No fevers, chills, nausea, vomiting, or diarrhea. 03/14/2021 upon evaluation today patient appears to be doing well with regard to her wound. There is a little bit of a limp on the plantar edge of the wound opening which I Minna see about trying to clear away today. Fortunately I think other than this everything seems to be showing signs of improvement measurements  still are roughly the same. It is definitely not as deep however. 03/28/2021 upon evaluation today patient appears to be doing well with regard to her wound. There is still some slough noted in the base of the wound. Fortunately there does not appear to be any evidence of active infection locally nor systemically at this time which is good news. 04/11/2021 upon evaluation today patient appears to be doing decently well in regard to her foot ulcer. This is showing a little bit of moisture buildup but fortunately nothing too significant I think the wound is looking better. This is just very slow to heal. Fortunately I do not see any evidence of active infection at this time. 04/18/2021 upon evaluation today patient appears to be doing well with regard to her ulcer. In fact this is getting very close to completely resolved. I am actually extremely pleased with where things stand today and how this appears. 05/02/2021 upon evaluation patient appears to be doing really about the same in regard to her wound I do not see any signs of significant worsening which is great news and overall very pleased in that regard. With that being said I do feel like that the patient is continuing to have really stalling of the wound she also has a couple areas that appear to be deep tissue injuries that are somewhat unfortunate to be perfectly honest. With that being said I do see evidence here in general of improvement which is good with regard to the depth of the wound we just need to try to protect her legs and hopefully her feet while allowing this last little  bit to heal. Patient's daughter is doing an excellent job taking care of her and I definitely do not see any issues in that regard at all she is very diligent in trying to keep pressure off as far as the feet are concerned. Nonetheless I do believe the patient still despite all of this is still getting several areas of deep tissue injury which raises the question as to  why is this a blood flow issue or something else in the past she really has done well and had no significant blood flow limitations with her ABIs of May 2020 to be excellent and normal. We will see how things progress and if we need to repeat this we will definitely do so. 05/09/2021 upon evaluation today patient unfortunately appears to not be doing quite as well as what I previously saw. In fact her feet seem to be showing signs of breakdown at several locations as well as the wound. In general she has not been eating or drinking well I am kind of concerned that she may be shutting down to some degree. 05-23-2021 on evaluation today patient appears to be doing well all things considered with regard to her wound. She does have a little bit of erythema around the medial foot ulcer none of the other deep tissue injuries have open which is good news. Fortunately I see no evidence of active infection locally nor systemically at this time which is also great news. No fevers, chills, nausea, vomiting, or diarrhea. 06-06-2021 upon evaluation today patient appears to be doing better in regard to her foot ulcers. Everything is actually showing signs of improvement which is great news. Fortunately I do not see any signs of active infection locally nor systemically which is great news. In fact her overall coloring of her foot seems better and she is also not having as much pain as she was. Her blood pressure is also more stable and had been dropping very low obviously I think that that is probably a big part of why things are looking better. 4/27; using Hydrofera Blue and 2 wounds on the left foot on the tip of the left great toe and on the left medial foot 5/8; the patient however has 3 wounds on the left foot. 1 at the tip of the left great toe, 1 medially on the left foot and another on the lateral fifth metatarsal head. Using Hydrofera Blue in all wounds. She does not complain of pain. 07-01-2021 upon  evaluation today patient appears to be doing well with regard to her wounds everything is appearing to do well no signs of infection in general and very pleased with where we stand today. There does not appear to be any signs of active infection locally or systemically which is great news. No fevers, chills, nausea, vomiting, or diarrhea. 07-22-2021 upon evaluation today patient appears to be doing well in general in regard to her wound she does have a new wound on the right shin this is due to a trauma that is a small skin tear. This happened in the past week. With that being said overall she seems to be doing quite well I do not see any evidence of a infection which is great news. No fevers, chills, nausea, vomiting, or diarrhea. 08-12-2021 upon evaluation today patient appears to be doing well with regard to her feet. Overall the wound on the right leg is healed the wound on the left foot area at all locations seem to be doing okay  I do not see any signs of obvious active infection at this time which is great news. 09-02-2021 upon evaluation today patient appears to be doing well currently in regard to her wounds on the legs specifically her feet. She has been tolerating the dressing changes without complication. Good news is she seems to be tolerating the dressing changes without any complication and is doing much better the only issue she has she does have a skin tear on the right forearm that actually happened on the way into the clinic today. 09-23-2021 upon evaluation today patient appears to be doing well currently in regard to her wounds in fact everything is healed except for just 1 remaining area on the lateral aspect of her foot which is very tiny. I am extremely pleased with where things stand today. I do not see any evidence of active infection locally or systemically. 10-14-2021 upon evaluation today patient appears to be doing well currently in regard to her wounds in fact everything appears  to be completely healed which is great news. [name] [name], [name] ([id number]) Objective Constitutional Well-nourished and well-hydrated in no acute distress. Vitals Time Taken: 1:42 PM, Height: 58 in, Weight: 137 lbs, BMI: 28.6, Temperature: 98.3 F, Pulse: 71 bpm, Respiratory Rate: 18 breaths/min, Blood Pressure: 153/88 mmHg. Respiratory normal breathing without difficulty. Psychiatric this patient is able to make decisions and demonstrates good insight into disease process. Alert and Oriented x 3. pleasant and cooperative. General Notes: Upon inspection patient's wound bed actually showed signs of good granulation and epithelization at this point. Fortunately there does not appear to be any signs of active infection at this time which is great news and overall I am extremely pleased with where we stand today. Integumentary (Hair, Skin) Wound #6 status is Healed - Epithelialized. Original cause of wound was Gradually Appeared. The date acquired was: 06/24/2021. The wound has been in treatment 16 weeks. The wound is located on the Left,Lateral Foot. The wound measures 0cm length x 0cm width x 0cm depth; 0cm^2 area and 0cm^3 volume. There is Fat Layer (Subcutaneous Tissue) exposed. There is a medium amount of serosanguineous drainage noted. There is medium (34-66%) pink, pale granulation within the wound bed. There is a medium (34-66%) amount of necrotic tissue within the wound bed. Assessment Active Problems ICD-10 Pressure ulcer of other site, stage 3 Laceration without foreign body, right lower leg, initial encounter Essential (primary) hypertension Vascular dementia without behavioral disturbance Atherosclerotic heart disease of native coronary artery without angina pectoris Laceration without foreign body of right forearm, initial encounter Plan Discharge From [facility] Services: Discharge from [place] Treatment Complete - wound healed, please call if any further issues  arise Wear compression garments daily. Put garments on first thing when you wake up and remove them before bed. Moisturize legs daily after removing compression garments. 1. I am good recommend that we go ahead and continue with the wound care measures as before and the patient is in agreement with plan this includes the use of a protective dressing over the lateral portion of her left foot I think this is still good to be good to continue with just to be on the safe side. 2. Also can recommend that we have the patient continue to monitor for any signs of worsening infection. Obviously if anything changes patient should contact the office and let me know rather her daughter will but right now everything appears to be doing quite well. Follow-up as needed Electronic Signature(s) Signed: 10/14/2021 2:27:27 PM  By: Worthy Keeler PA-C Wholey, Paizley L. ([id number]) Entered By: Worthy Keeler on 10/14/2021 14:27:27 Daino, Charmon L. ([id number]) -------------------------------------------------------------------------------- SuperBill Details Patient Name: Stawicki, Ailish L. Date of Service: 10/14/2021 Medical Record Number: [id number] Patient Account Number: [id number] Date of Birth/Sex: 11-Sep-1926 (88 y.o. F) Treating RN: Cornell Barman Primary Care Provider: [name] Other Clinician: Massie Kluver Referring Provider: [name] Treating Provider/Extender: Skipper Cliche in Treatment: 67 Diagnosis Coding ICD-10 Codes Code Description [phone] Pressure ulcer of other site, stage 3 S81.811A Laceration without foreign body, right lower leg, initial encounter [place] (primary) hypertension I25.10 Atherosclerotic heart disease of native coronary artery without angina pectoris S51.811A Laceration without foreign body of right forearm, initial encounter Facility Procedures CPT4 Code: [id number] Description: [phone] - WOUND CARE VISIT-LEV 2 EST PT Modifier: Quantity: 1 Physician  Procedures CPT4 Code: [id number] Description: 99213 - WC PHYS LEVEL 3 - EST PT Modifier: Quantity: 1 CPT4 Code: Description: ICD-10 Diagnosis Description L89.893 Pressure ulcer of other site, stage 3 S81.811A Laceration without foreign body, right lower leg, initial encounter I10 Essential (primary) hypertension Modifier: Quantity: Electronic Signature(s) Signed: 10/14/2021 2:27:59 PM By: Worthy Keeler PA-C Entered By: Worthy Keeler on 10/14/2021 14:27:58

## 2021-10-14 NOTE — Progress Notes (Signed)
Wortley, Jalaya L. ([id number]) Visit Report for 10/14/2021 Arrival Information Details Patient Name: Ales, JALAILA CARADONNA. Date of Service: 10/14/2021 1:30 PM Medical Record Number: [id number] Patient Account Number: [id number] Date of Birth/Sex: [birth year] (88 y.o. F) Treating RN: Cornell Barman Primary Care Gursimran Litaker: [name] Other Clinician: Massie Kluver Referring Montray Kliebert: [name] Treating Malic Rosten/Extender: Skipper Cliche in Treatment: 79 Visit Information History Since Last Visit All ordered tests and consults were completed: No Patient Arrived: Wheel Chair Added or deleted any medications: No Arrival Time: 13:38 Any new allergies or adverse reactions: No Transfer Assistance: EasyPivot Patient Lift Had a fall or experienced change in No Patient Requires Transmission-Based No activities of daily living that may affect Precautions: risk of falls: Patient Has Alerts: Yes Hospitalized since last visit: No Patient Alerts: NOT diabetic Pain Present Now: No ABI R1.18 L1.25 06/27/20 Electronic Signature(s) Unsigned Entered By: Massie Kluver on 10/14/2021 13:38:57 Signature(s): Date(s): Sangster, Brenya L. ([id number]) -------------------------------------------------------------------------------- Pain Assessment Details Patient Name: Terral, Elton L. Date of Service: 10/14/2021 1:30 PM Medical Record Number: [id number] Patient Account Number: [id number] Date of Birth/Sex: December 02, 1926 (88 y.o. F) Treating RN: Cornell Barman Primary Care Undine Nealis: [name] Other Clinician: Massie Kluver Referring Daijanae Rafalski: [name] Treating Quinlee Sciarra/Extender: Skipper Cliche in Treatment: 38 Active Problems Location of Pain Severity and Description of Pain Patient Has Paino No Site Locations Pain Management and Medication Current Pain Management: Electronic Signature(s) Unsigned Entered ByMassie Kluver on 10/14/2021 13:43:01 Signature(s): Date(s): Mcloud,  Anniyah L. ([id number]) -------------------------------------------------------------------------------- Vitals Details Patient Name: Boxley, Lenah L. Date of Service: 10/14/2021 1:30 PM Medical Record Number: [id number] Patient Account Number: [id number] Date of Birth/Sex: January 27, 1927 (88 y.o. F) Treating RN: Cornell Barman Primary Care Sonya Pucci: [name] Other Clinician: Massie Kluver Referring Michiah Masse: [name] Treating Carley Glendenning/Extender: Skipper Cliche in Treatment: 67 Vital Signs Time Taken: 13:42 Temperature (F): 98.3 Height (in): 58 Pulse (bpm): 71 Weight (lbs): 137 Respiratory Rate (breaths/min): 18 Body Mass Index (BMI): 28.6 Blood Pressure (mmHg): 153/88 Reference Range: 80 - 120 mg / dl Electronic Signature(s) Unsigned Entered ByMassie Kluver on 10/14/2021 13:42:45 Signature(s): Date(s):

## 2021-11-09 DIAGNOSIS — F0150 Vascular dementia without behavioral disturbance: Secondary | ICD-10-CM | POA: Diagnosis not present

## 2021-11-20 ENCOUNTER — Other Ambulatory Visit: Payer: Self-pay | Admitting: Family Medicine

## 2021-12-05 ENCOUNTER — Other Ambulatory Visit: Payer: PPO | Admitting: Student

## 2021-12-05 DIAGNOSIS — F419 Anxiety disorder, unspecified: Secondary | ICD-10-CM

## 2021-12-05 DIAGNOSIS — F0150 Vascular dementia without behavioral disturbance: Secondary | ICD-10-CM | POA: Diagnosis not present

## 2021-12-05 DIAGNOSIS — Z515 Encounter for palliative care: Secondary | ICD-10-CM

## 2021-12-05 DIAGNOSIS — R451 Restlessness and agitation: Secondary | ICD-10-CM | POA: Diagnosis not present

## 2021-12-05 NOTE — Progress Notes (Signed)
[facility] Palliative Care Consult Note Telephone: [phone]  Fax: [phone]    Date of encounter: 12/05/21 11:38 AM PATIENT NAME: [name] [name] [address] [id number]   [phone] (home)  DOB: 10-Jul-1926 MRN: [id number] PRIMARY CARE PROVIDER:    Birdie Sons, MD,  [address] [place] [address] [phone]  REFERRING PROVIDER:   Birdie Sons, [place],  [place] [address] [phone]  RESPONSIBLE PARTY:    Contact Information     Name Relation Home Work Mobile   Reich,Jeanie Daughter [phone]  [phone]        I met face to face with patient and family in the home. Palliative Care was asked to follow this patient by consultation request of  Fisher, Kirstie Peri, MD to address advance care planning and complex medical decision making. This is a follow up visit.                                   ASSESSMENT AND PLAN / RECOMMENDATIONS:   Advance Care Planning/Goals of Care: Goals include to maximize quality of life and symptom management. Patient/health care surrogate gave his/her permission to discuss. Our advance care planning conversation included a discussion about:    The value and importance of advance care planning  Experiences with loved ones who have been seriously ill or have died  Exploration of personal, cultural or spiritual beliefs that might influence medical decisions  Exploration of goals of care in the event of a sudden injury or illness  Identification of a healthcare agent  Review and updating or creation of an advance directive document  CODE STATUS: DNR  Education provided on Palliative Medicine. Patient with further cognitive and functional decline. We discussed referring patient for hospice evaluation. Daughter is in agreement. Will notify PCP with request for order.   Symptom Management/Plan:  Vascular dementia-patient with  worsening cognition, communication. Her speech is limited to 1-2 words. Increased difficulty following directions. Increased weakness and stiffness.  Patient is leaning more to right side, keeps right hand closed. Monitor for falls/safety, worsening cognition, decline in appetite. Weight loss as evidenced by loss in muscle mass. Will refer for hospice evaluation.   Restlessness, anxiety, itching-recommend hydroxyzine 25 mg take 1/2 tablet TID prn. Script sent to pharmacy.  Follow up Palliative Care Visit: Palliative care will continue to follow for complex medical decision making, advance care planning, and clarification of goals. Return  prn.  I spent 60 minutes providing this consultation. More than 50% of the time in this consultation was spent in counseling and care coordination.  PPS: 30%  HOSPICE ELIGIBILITY/DIAGNOSIS: TBD  Chief Complaint: Palliative Medicine follow up visit.   HISTORY OF PRESENT ILLNESS:  [name] [name] is a 88 y.o. year old female  with vascular dementia, CAD, hypertension, PVD, DVT of right lower extremity- no longer on Eliquis, GERD, hypothyroidism, CKD 4, gout.   Speech is more incoherent, increased confusion, difficulty following directions. She forgets that she has eating. Family does provide food throughout the day when she states she hasn't eaten. Increased difficulty feeding herself. Will eat finger foods. She is not using her right hand, which is her dominant hand; keeps hand bawled up. She is scratching and restless at night. Increased weakness, stiffness. Wound to left foot has healed. No recent falls. She requires assist x 1 for transfers; she  is becoming more resistant with adl care.   Patient received sitting in w/c at dining room table. She is leaning to right side. Her speech is more difficult to follow; only few words are clear. She has more difficulty following directions. Patient's dentures are ill fitting.  History obtained from review of EMR,  discussion with primary team, and interview with family, facility staff/caregiver and/or Ms. Goupil.  I reviewed available labs, medications, imaging, studies and related documents from the EMR.  Records reviewed and summarized above.   ROS  Obtained per family due to her advanced dementia.   Physical Exam: Pulse 76, resp 16, b/p 130/90, sats 99% on room air Constitutional: NAD General: frail appearing, thin EYES: anicteric sclera, lids intact, no discharge  ENMT: intact hearing, oral mucous membranes moist CV: S1S2, RRR, no LE edema Pulmonary: LCTA, no increased work of breathing, no cough, room air Abdomen: normo-active BS + 4 quadrants, soft and non tender, no ascites GU: deferred MSK: + sarcopenia, moves all extremities, non-ambulatory Skin: warm and dry, no rashes or wounds on visible skin Neuro: + generalized weakness,  + cognitive impairment Psych: non-anxious affect, A and O x to person Hem/lymph/immuno: no widespread bruising   Thank you for the opportunity to participate in the care of Ms. Weinberg.  The palliative care team will continue to follow. Please call our office at [phone] if we can be of additional assistance.   Ezekiel Slocumb, NP   COVID-19 PATIENT SCREENING TOOL Asked and negative response unless otherwise noted:   Have you had symptoms of covid, tested positive or been in contact with someone with symptoms/positive test in the past 5-10 days? No

## 2021-12-06 ENCOUNTER — Telehealth: Payer: Self-pay

## 2021-12-06 NOTE — Telephone Encounter (Signed)
That's fine

## 2021-12-06 NOTE — Telephone Encounter (Signed)
Copied from [place] [id number] [id number]. Topic: General - Other >> Dec 06, 2021  3:53 PM Everette C wrote: [name] Charlann Boxer, NP with Palliative Medicine has called to place on order for a hospice evaluation   [name] [name] has also requested that Dr Caryn Section be the patient's attending physician while they are in palliative care

## 2021-12-06 NOTE — Telephone Encounter (Addendum)
Returned call to [employer]. Verbal order given.

## 2021-12-09 DIAGNOSIS — F0150 Vascular dementia without behavioral disturbance: Secondary | ICD-10-CM | POA: Diagnosis not present

## 2021-12-16 ENCOUNTER — Encounter (INDEPENDENT_AMBULATORY_CARE_PROVIDER_SITE_OTHER): Payer: Self-pay

## 2021-12-27 ENCOUNTER — Encounter: Payer: Self-pay | Admitting: Podiatry

## 2021-12-27 ENCOUNTER — Ambulatory Visit: Payer: PPO | Admitting: Podiatry

## 2021-12-27 DIAGNOSIS — M79676 Pain in unspecified toe(s): Secondary | ICD-10-CM | POA: Diagnosis not present

## 2021-12-27 DIAGNOSIS — B351 Tinea unguium: Secondary | ICD-10-CM

## 2021-12-27 NOTE — Progress Notes (Signed)
   Chief Complaint  Patient presents with   foot care    Foot care    SUBJECTIVE Patient presents to office today complaining of elongated, thickened nails that cause pain while ambulating in shoes.  Patient is unable to trim their own nails.  Patient does have chronic ulcers to the bilateral feet that is being managed at the wound care center weekly.  Dressings were left intact today.  Patient is here for routine foot care  Past Medical History:  Diagnosis Date   Hyperlipidemia    Hypertension    Myocardial infarction [facility])     OBJECTIVE General Patient is awake, alert, and oriented x 3 and in no acute distress. Derm Skin is dry and supple bilateral. Negative open lesions or macerations. Remaining integument unremarkable. Nails are tender, long, thickened and dystrophic with subungual debris, consistent with onychomycosis, 1-5 bilateral. No signs of infection noted.  Dressings to the bilateral wounds were left intact today. Vasc  DP and PT pedal pulses palpable bilaterally. Temperature gradient within normal limits.  Neuro Epicritic and protective threshold sensation grossly intact bilaterally.  Musculoskeletal Exam No symptomatic pedal deformities noted bilateral. Muscular strength within normal limits.  ASSESSMENT 1.  Pain due to onychomycosis of toenails both 2.  Chronic wounds bilateral feet being managed at the wound care center  PLAN OF CARE 1. Patient evaluated today.  2. Instructed to maintain good pedal hygiene and foot care.  3. Mechanical debridement of nails 1-5 bilaterally performed using a nail nipper. Filed with dremel without incident.  4. Return to clinic in 3 mos.    Edrick Kins, DPM [facility]  Dr. Edrick Kins, DPM    [address] [place] [address], [place] [address]                Office [phone]  Fax [phone]

## 2022-01-09 DIAGNOSIS — F0150 Vascular dementia without behavioral disturbance: Secondary | ICD-10-CM | POA: Diagnosis not present

## 2022-01-24 ENCOUNTER — Ambulatory Visit: Payer: Self-pay | Admitting: *Deleted

## 2022-01-24 DIAGNOSIS — N760 Acute vaginitis: Secondary | ICD-10-CM

## 2022-01-24 DIAGNOSIS — N3000 Acute cystitis without hematuria: Secondary | ICD-10-CM

## 2022-01-24 MED ORDER — KETOCONAZOLE 2 % EX CREA: 1.0000 | TOPICAL_CREAM | Freq: Every day | CUTANEOUS | 0 refills | Status: DC

## 2022-01-24 MED ORDER — CEPHALEXIN 500 MG PO CAPS: 500.0000 mg | ORAL_CAPSULE | Freq: Four times a day (QID) | ORAL | 0 refills | Status: DC

## 2022-01-24 NOTE — Telephone Encounter (Signed)
Reason for Disposition  [1] Caller requesting NON-URGENT health information AND [2] PCP's office is the best resource    Needing antibiotic for UTI  Answer Assessment - Initial Assessment Questions 1. REASON FOR CALL or QUESTION: "What is your reason for calling today?" or "How can I best help you?" or "What question do you have that I can help answer?"     Mitzi from [facility] calling in.  Urine checked because her behavior had changed.     Preliminary result is gram negative rods.   No allergies to antibiotics.  Still waiting for culture result.   Probably Monday before it comes back.  She has been clawing at her labia and they are swollen very bad almost like a female's genitals.    So Dr. Caryn Section may want to go a head and start her on something today.  Protocols used: Information Only Call - No Triage-A-AH  Chief Complaint: Pt's urine sample has come back with preliminary result of gram negative rods.   It was cloudy.  Her behavior had changed so urine checked.  Urine culture not back yet.   Expect it Monday. Symptoms: Her labia are very swollen almost like a female's genitals.   She is scratching down there really bad. Frequency: N/A Pertinent Negatives: Patient denies N/A Disposition: [] ED /[] Urgent Care (no appt availability in office) / [] Appointment(In office/virtual)/ []  [place] [facility]/ [] Home Care/ [] Refused Recommended Disposition /[] [facility]/ [x]  Follow-up with PCP Additional Notes: Mitzi with [facility] wants to be contacted at [phone] so she can give report to  the family and educate them if Dr. Caryn Section decides to call something in today.    [facility].

## 2022-01-24 NOTE — Addendum Note (Signed)
Addended by: Birdie Sons on: 01/24/2022 02:26 PM   Modules accepted: Orders

## 2022-01-24 NOTE — Telephone Encounter (Signed)
Have sent prescription for antibiotic for uti and antifungal cream for swollen labia to [facility].

## 2022-01-24 NOTE — Telephone Encounter (Signed)
Mitz was advised.

## 2022-01-27 ENCOUNTER — Telehealth: Payer: Self-pay | Admitting: Family Medicine

## 2022-01-27 DIAGNOSIS — N390 Urinary tract infection, site not specified: Secondary | ICD-10-CM

## 2022-01-27 MED ORDER — CEFDINIR 300 MG PO CAPS: 300.0000 mg | ORAL_CAPSULE | Freq: Two times a day (BID) | ORAL | 0 refills | Status: AC

## 2022-01-27 NOTE — Telephone Encounter (Signed)
Urine cultures show infection that is resistant to cephalexin. Have sent new prescription for cefdinir to take in it's place.

## 2022-02-08 DIAGNOSIS — F0150 Vascular dementia without behavioral disturbance: Secondary | ICD-10-CM | POA: Diagnosis not present

## 2022-03-11 DIAGNOSIS — F0150 Vascular dementia without behavioral disturbance: Secondary | ICD-10-CM | POA: Diagnosis not present

## 2022-04-08 ENCOUNTER — Other Ambulatory Visit: Payer: Self-pay | Admitting: Family Medicine

## 2022-04-08 DIAGNOSIS — N760 Acute vaginitis: Secondary | ICD-10-CM

## 2022-04-09 NOTE — Telephone Encounter (Signed)
Requested medication (s) are due for refill today -yes  Requested medication (s) are on the active medication list -yes  Future visit scheduled -no  Last refill: 01/24/22 15g  Notes to clinic: non delegated Rx  Requested Prescriptions  Pending Prescriptions Disp Refills   ketoconazole (NIZORAL) 2 % cream [Pharmacy Med Name: KETOCONAZOLE 2% TOP CREAM GM] 15 g 0    Sig: APPLY TO AFFECTED AREAS TOPICALLY EVERY DAY     Not Delegated - Over the Counter: OTC 2 Failed - 04/08/2022 10:57 AM      Failed - This refill cannot be delegated      Passed - Valid encounter within last 12 months    Recent Outpatient Visits           9 months ago Primary hypertension   [place] Birdie Sons, MD   1 year ago Primary hypertension   [place], Donald E, MD   1 year ago Acute deep vein thrombosis (DVT) of calf muscle vein of right lower extremity [facility])   [place] Birdie Sons, MD   1 year ago Primary hypertension   [place], Donald E, MD   1 year ago Hypothyroidism, unspecified type   [place], Donald E, MD                 Requested Prescriptions  Pending Prescriptions Disp Refills   ketoconazole (NIZORAL) 2 % cream [Pharmacy Med Name: KETOCONAZOLE 2% TOP CREAM GM] 15 g 0    Sig: APPLY TO AFFECTED AREAS TOPICALLY EVERY DAY     Not Delegated - Over the Counter: OTC 2 Failed - 04/08/2022 10:57 AM      Failed - This refill cannot be delegated      Passed - Valid encounter within last 12 months    Recent Outpatient Visits           9 months ago Primary hypertension   [facility] Birdie Sons, MD   1 year ago Primary hypertension   [place], Donald E, MD   1 year ago Acute deep vein thrombosis (DVT) of calf muscle vein of right lower extremity  [facility])    [facility] Birdie Sons, MD   1 year ago Primary hypertension   [place], Donald E, MD   1 year ago Hypothyroidism, unspecified type   [place], Donald E, MD

## 2022-04-11 DIAGNOSIS — F0150 Vascular dementia without behavioral disturbance: Secondary | ICD-10-CM | POA: Diagnosis not present

## 2022-05-08 ENCOUNTER — Other Ambulatory Visit: Payer: Self-pay | Admitting: Family Medicine

## 2022-05-08 DIAGNOSIS — N760 Acute vaginitis: Secondary | ICD-10-CM

## 2022-05-08 NOTE — Telephone Encounter (Signed)
Requested medication (s) are due for refill today - yes  Requested medication (s) are on the active medication list -yes  Future visit scheduled -no  Last refill: 04/09/22 15g  Notes to clinic: non delegated Rx  Requested Prescriptions  Pending Prescriptions Disp Refills   ketoconazole (NIZORAL) 2 % cream [Pharmacy Med Name: KETOCONAZOLE 2% TOP CREAM GM] 15 g 0    Sig: APPLY TO AFFECTED AREAS TOPICALLY EVERY DAY     Not Delegated - Over the Counter: OTC 2 Failed - 05/08/2022 11:07 AM      Failed - This refill cannot be delegated      Passed - Valid encounter within last 12 months    Recent Outpatient Visits           10 months ago Primary hypertension   [place] Birdie Sons, MD   1 year ago Primary hypertension   [place], Donald E, MD   1 year ago Acute deep vein thrombosis (DVT) of calf muscle vein of right lower extremity [facility])   [facility] Birdie Sons, MD   1 year ago Primary hypertension   [place], Donald E, MD   1 year ago Hypothyroidism, unspecified type   [place], Donald E, MD                 Requested Prescriptions  Pending Prescriptions Disp Refills   ketoconazole (NIZORAL) 2 % cream [Pharmacy Med Name: KETOCONAZOLE 2% TOP CREAM GM] 15 g 0    Sig: APPLY TO AFFECTED AREAS TOPICALLY EVERY DAY     Not Delegated - Over the Counter: OTC 2 Failed - 05/08/2022 11:07 AM      Failed - This refill cannot be delegated      Passed - Valid encounter within last 12 months    Recent Outpatient Visits           10 months ago Primary hypertension   [place] Birdie Sons, MD   1 year ago Primary hypertension   [place], Donald E, MD   1 year ago Acute deep vein thrombosis (DVT) of calf muscle vein of right lower  extremity [facility]   [facility] Birdie Sons, MD   1 year ago Primary hypertension   [facility] [facility] Birdie Sons, MD   1 year ago Hypothyroidism, unspecified type   [place], Donald E, MD

## 2022-05-10 DIAGNOSIS — F0150 Vascular dementia without behavioral disturbance: Secondary | ICD-10-CM | POA: Diagnosis not present

## 2022-06-10 DIAGNOSIS — F0150 Vascular dementia without behavioral disturbance: Secondary | ICD-10-CM | POA: Diagnosis not present

## 2022-06-23 ENCOUNTER — Ambulatory Visit: Payer: PPO

## 2022-06-23 VITALS — Ht 61.0 in | Wt 128.0 lb

## 2022-06-23 DIAGNOSIS — Z0000 Encounter for general adult medical examination without abnormal findings: Secondary | ICD-10-CM

## 2022-06-23 NOTE — Progress Notes (Signed)
I connected with [name] [name] (pt's daughter and caretaker) regarding her mother, [name] [name] on 06/23/22 by a audio enabled telemedicine application and verified that I am speaking with the correct person using two identifiers.  Patient Location: Home  Provider Location: Office/Clinic  I discussed the limitations of evaluation and management by telemedicine. The patient expressed understanding and agreed to proceed.  Subjective:   [name] [name] is a 89 y.o. female who presents for Medicare Annual (Subsequent) preventive examination.  Review of Systems     Cardiac Risk Factors include: advanced age (>33men, >77 women);sedentary lifestyle;dyslipidemia;hypertension     Objective:    Today's Vitals   06/23/22 1331  Weight: 128 lb (58.1 kg)  Height: 5\' 1"  (1.549 m)   Body mass index is 24.19 kg/m.     06/23/2022    1:37 PM 06/17/2021   11:29 AM 04/01/2020    1:00 PM 03/27/2020    5:00 PM 03/27/2020    4:31 PM 03/27/2020    9:15 AM 08/09/2019    9:48 AM  Advanced Directives  Does Patient Have a Medical Advance Directive? Yes Yes   Yes Yes Yes  Type of Special educational needs teacher of [place];Living will Healthcare Power of [employer] of [place];Living will Healthcare Power of [place];Living will Living will;Healthcare Power of [employer] Power of [place];Living will  Does patient want to make changes to medical advance directive?  Yes (Inpatient - patient defers changing a medical advance directive and declines information at this time)       Copy of Healthcare Power of Attorney in Chart?  Yes - validated most recent copy scanned in chart (See row information)  No - copy requested   No - copy requested    Current Medications (verified) Outpatient Encounter Medications as of 06/23/2022  Medication Sig   levothyroxine (SYNTHROID) 88 MCG tablet TAKE 1 TABLET BY MOUTH EVERY DAY BEFORE BREAKFAST   allopurinol (ZYLOPRIM) 100 MG tablet Take 2 tablets  (200 mg total) by mouth daily. (Patient not taking: Reported on 06/23/2022)   amLODipine (NORVASC) 2.5 MG tablet Take 1 tablet (2.5 mg total) by mouth every evening. 1-2 tablets daily only when needed for BP > 140/90 (Patient not taking: Reported on 06/23/2022)   Calcium Carb-Cholecalciferol (CALCIUM 600 + D PO) Take 1 tablet by mouth daily. (Patient not taking: Reported on 06/23/2022)   gentamicin cream (GARAMYCIN) 0.1 %  (Patient not taking: Reported on 06/23/2022)   gentamicin cream (GARAMYCIN) 0.1 % Apply topically 3 (three) times a week. (Patient not taking: Reported on 06/23/2022)   ketoconazole (NIZORAL) 2 % cream APPLY TO AFFECTED AREAS TOPICALLY EVERY DAY (Patient not taking: Reported on 06/23/2022)   nystatin (MYCOSTATIN/NYSTOP) powder POWDER TOPICALLY TO THE GLUTEAL REGION 2TIMES PER DAY FOR 30 DAYS. (Patient not taking: Reported on 06/23/2022)   No facility-administered encounter medications on file as of 06/23/2022.    Allergies (verified) Atorvastatin, Lovastatin, and Simvastatin   History: Past Medical History:  Diagnosis Date   Hyperlipidemia    Hypertension    Myocardial infarction [facility])    Past Surgical History:  Procedure Laterality Date   ABDOMINAL HYSTERECTOMY  1995   abdominal-uterine prolapse, 1 ovary removed, bladder tact   CARDIAC CATHETERIZATION     CHOLECYSTECTOMY     HIP PINNING,CANNULATED Right 03/28/2020   Procedure: CANNULATED HIP PINNING;  Surgeon: Juanell Fairly, MD;  Location: [facility] ORS;  Service: Orthopedics;  Laterality: Right;   TONSILLECTOMY AND ADENOIDECTOMY     Family History  Problem Relation Age of Onset   Ovarian cancer Mother    Alcohol abuse Sister    Alzheimer's disease Sister    Social History   Socioeconomic History   Marital status: Widowed    Spouse name: Not on file   Number of children: 5   Years of education: Not on file   Highest education level: Some college, no degree  Occupational History   Occupation: Retired  Tobacco Use    Smoking status: Former    Types: Cigarettes    Quit date: 02/18/2000    Years since quitting: 22.3   Smokeless tobacco: Never  Vaping Use   Vaping Use: Never used  Substance and Sexual Activity   Alcohol use: No    Alcohol/week: 0.0 standard drinks of alcohol   Drug use: No   Sexual activity: Never  Other Topics Concern   Not on file  Social History Narrative   Not on file   Social Determinants of Health   Financial Resource Strain: Low Risk  (06/23/2022)   Overall Financial Resource Strain (CARDIA)    Difficulty of Paying Living Expenses: Not hard at all  Food Insecurity: No Food Insecurity (06/23/2022)   Hunger Vital Sign    Worried About Running Out of Food in the Last Year: Never true    Ran Out of Food in the Last Year: Never true  Transportation Needs: No Transportation Needs (06/23/2022)   PRAPARE - Administrator, Civil Service (Medical): No    Lack of Transportation (Non-Medical): No  Physical Activity: Inactive (06/17/2021)   Exercise Vital Sign    Days of Exercise per Week: 0 days    Minutes of Exercise per Session: 0 min  Stress: No Stress Concern Present (06/17/2021)   [employer] of Occupational Health - Occupational Stress Questionnaire    Feeling of Stress : Not at all  Social Connections: Socially Isolated (06/23/2022)   Social Connection and Isolation Panel [NHANES]    Frequency of Communication with Friends and Family: Never    Frequency of Social Gatherings with Friends and Family: Once a week    Attends Religious Services: Never    Database administrator or Organizations: No    Attends Banker Meetings: Never    Marital Status: Widowed    Tobacco Counseling Counseling given: Not Answered  Clinical Intake:  Pre-visit preparation completed: Yes  Pain :  (per daughter)   BMI - recorded: 24.19 Nutritional Status: BMI of 19-24  Normal Nutritional Risks: None Diabetes: No  How often do you need to have someone help you when  you read instructions, pamphlets, or other written materials from your doctor or pharmacy?: 1 - Never  Diabetic?no  Interpreter Needed?: No  Comments: pt at home with hospice Information entered by :: B.Ian Cavey,LPN   Activities of Daily Living    06/23/2022    1:37 PM  In your present state of health, do you have any difficulty performing the following activities:  Hearing? 1  Vision? 1  Difficulty concentrating or making decisions? 1  Walking or climbing stairs? 1  Dressing or bathing? 1  Doing errands, shopping? 1  Preparing Food and eating ? Y  Using the Toilet? Y  In the past six months, have you accidently leaked urine? Y  Do you have problems with loss of bowel control? Y  Managing your Medications? Y  Managing your Finances? Y  Housekeeping or managing your Housekeeping? Y    Patient Care Team: Sherrie Mustache,  Demetrios Isaacs, MD as PCP - General (Family Medicine) Lockie Mola, MD as Referring Physician (Ophthalmology) Felecia Shelling, DPM as Consulting Physician (Podiatry)  Indicate any recent Medical Services you may have received from other than [facility] providers in the past year (date may be approximate).     Assessment:   This is a routine wellness examination for [name]  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      LIFESTYLE - DECREASE FALLS RISK   On track    Recommend to remove any items from the home  that may cause slips or trips.       Depression Screen    06/17/2021   11:28 AM 05/28/2020   11:27 AM 05/28/2020   11:14 AM 01/10/2020   11:06 AM 08/09/2019    9:43 AM 06/13/2019    1:38 PM 04/05/2018    8:49 AM  PHQ 2/9 Scores  PHQ - 2 Score 0 0 0 0 0 0 0  PHQ- 9 Score  1 0 1       Fall Risk    06/23/2022    1:34 PM 06/17/2021   11:30 AM 06/15/2021    7:01 PM 02/19/2021   11:09 AM 05/28/2020   11:14 AM  Fall Risk   Falls in the past year? 0 1 1 1    Number falls in past yr: 0 1 1 1 1   Injury with Fall? 0 0 0 1 1  Risk for fall due to : No Fall Risks History of fall(s)  History of fall(s) History of fall(s)  Follow up Education provided;Falls prevention discussed Falls prevention discussed  Falls prevention discussed Falls prevention discussed    FALL RISK PREVENTION PERTAINING TO THE HOME:  Any stairs in or around the home? Yes  If so, are there any without handrails? Yes  Home free of loose throw rugs in walkways, pet beds, electrical cords, etc? Yes  Adequate lighting in your home to reduce risk of falls? Yes   ASSISTIVE DEVICES UTILIZED TO PREVENT FALLS:  Life alert? No  Use of a cane, walker or w/c? No  Grab bars in the bathroom? Yes  Shower chair or bench in shower? Yes  Elevated toilet seat or a handicapped toilet? Yes    Cognitive Function:        08/05/2016   11:04 AM  6CIT Screen  What Year? 0 points  What month? 0 points  What time? 0 points  Count back from 20 0 points  Months in reverse 4 points  Repeat phrase 2 points  Total Score 6 points    Immunizations Immunization History  Administered Date(s) Administered   Fluad Quad(high Dose 65+) 12/03/2020   Influenza Split 01/22/2015   Influenza, High Dose Seasonal PF 12/21/2015, 12/23/2016, 11/30/2017, 11/25/2018, 11/25/2019   PFIZER(Purple Top)SARS-COV-2 Vaccination 03/14/2019, 04/04/2019, 11/25/2019   Pneumococcal Conjugate-13 04/10/2014   Pneumococcal Polysaccharide-23 12/28/2008   Tdap  04/29/2010   Zoster,  Live 04/29/2010    TDAP status: Up to date  Flu Vaccine status: Declined, Education has been provided regarding the importance of this vaccine but patient still declined. Advised may receive this vaccine at local pharmacy or Health Dept. Aware to provide a copy of the vaccination record if obtained from local pharmacy or Health Dept. Verbalized acceptance and understanding.  Pneumococcal vaccine status: Up to date  Covid-19 vaccine status: Completed vaccines  Qualifies for Shingles Vaccine? Yes   Zostavax completed No   Shingrix Completed?: No.    Education has been provided regarding the importance of this vaccine. Patient has been advised to call insurance company to determine out of pocket expense if they have not yet received this vaccine. Advised may also receive vaccine at local pharmacy or Health Dept. Verbalized acceptance and understanding.  Screening Tests Health Maintenance  Topic Date Due   Zoster Vaccines- Shingrix (1 of 2) Never done   DEXA SCAN  10/16/2018   DTaP/Tdap/Td (2 - Td or Tdap) 04/28/2020   COVID-19 Vaccine (4 - 2023-24 season) 10/18/2021   INFLUENZA VACCINE  09/18/2022   Pneumonia Vaccine 74+ Years old  Completed   HPV VACCINES  Aged Out    Health Maintenance  Health Maintenance Due  Topic Date Due   Zoster Vaccines- Shingrix (1 of 2) Never done   DEXA SCAN  10/16/2018   DTaP/Tdap/Td (2 - Td or Tdap) 04/28/2020   COVID-19 Vaccine (4 - 2023-24 season) 10/18/2021    Colorectal cancer screening: No longer required.   Mammogram status: No longer required due to age.  Lung Cancer Screening: (Low Dose CT Chest recommended if Age 39-80 years, 30 pack-year currently smoking OR have quit w/in 15years.) does not qualify.   Lung Cancer Screening Referral: no  Additional Screening:  Hepatitis C Screening: does not qualify; Completed yes  Vision Screening: Recommended annual ophthalmology exams for early detection of glaucoma and  other disorders of the eye. Is the patient up to date with their annual eye exam?  Yes  Who is the provider or what is the name of the office in which the patient attends annual eye exams? Dr Inez Pilgrim If pt is not established with a provider, would they like to be referred to a provider to establish care? No .   Dental Screening: Recommended annual dental exams for proper oral hygiene  Community Resource Referral / Chronic Care Management: CRR required this visit?  No   CCM required this visit?  No    Plan:     I have personally reviewed and noted the following in the patient's chart:   Medical and social history Use of alcohol, tobacco or illicit drugs  Current medications and supplements including opioid prescriptions. Patient is not currently taking opioid prescriptions. Functional ability and status Nutritional status Physical activity Advanced directives List of other physicians Hospitalizations, surgeries, and ER visits in previous 12 months Vitals Screenings to include cognitive, depression, and falls Referrals and appointments  In addition, I have reviewed and discussed with patient certain preventive protocols, quality metrics, and best practice recommendations. A written personalized care plan for preventive services as well as general preventive health recommendations were provided to patient.     Sue Lush, LPN   02/19/2438   Nurse Notes: I spoke with pt's daughter , [name] [name] who relays that pt is bedridden and is in hospice (at daughter's home). She relays respite care comes to get her and take her to facility for 5days then brings her back.  [facility] unable to complete per daughter. She has no concerns or questions at this time as Hospice is active with her care.

## 2022-06-23 NOTE — Patient Instructions (Addendum)
[name] [name] , Thank you for taking time to come for your Medicare Wellness Visit. I appreciate your ongoing commitment to your health goals. Please review the following plan we discussed and let me know if I can assist you in the future.   These are the goals we discussed:  Goals      DIET - EAT MORE FRUITS AND VEGETABLES     Increase water intake     Recommend increasing water intake to 4 glasses a day.      LIFESTYLE - DECREASE FALLS RISK     Recommend to remove any items from the home that may cause slips or trips.        This is a list of the screening recommended for you and due dates:  Health Maintenance  Topic Date Due   Zoster (Shingles) Vaccine (1 of 2) Never done   DEXA scan (bone density measurement)  10/16/2018   DTaP/Tdap/Td vaccine (2 - Td or Tdap) 04/28/2020   COVID-19 Vaccine (4 - 2023-24 season) 10/18/2021   Flu Shot  09/18/2022   Pneumonia Vaccine  Completed   HPV Vaccine  Aged Out    Advanced directives: yes  Conditions/risks identified: high falls risk  Next appointment: Follow up in one year for your annual wellness visit : 06/24/2023 @1 :30pm telephone   Preventive Care 65 Years and Older, Female Preventive care refers to lifestyle choices and visits with your health care provider that can promote health and wellness. What does preventive care include? A yearly physical exam. This is also called an annual well check. Dental exams once or twice a year. Routine eye exams. Ask your health care provider how often you should have your eyes checked. Personal lifestyle choices, including: Daily care of your teeth and gums. Regular physical activity. Eating a healthy diet. Avoiding tobacco and drug use. Limiting alcohol use. Practicing safe sex. Taking low-dose aspirin every day. Taking vitamin and mineral supplements as recommended by your health care provider. What happens during an annual well check? The services and screenings done by your health care  provider during your annual well check will depend on your age, overall health, lifestyle risk factors, and family history of disease. Counseling  Your health care provider may ask you questions about your: Alcohol use. Tobacco use. Drug use. Emotional well-being. Home and relationship well-being. Sexual activity. Eating habits. History of falls. Memory and ability to understand (cognition). Work and work Astronomer. Reproductive health. Screening  You may have the following tests or measurements: Height, weight, and BMI. Blood pressure. Lipid and cholesterol levels. These may be checked every 5 years, or more frequently if you are over 82 years old. Skin check. Lung cancer screening. You may have this screening every year starting at age 67 if you have a 30-pack-year history of smoking and currently smoke or have quit within the past 15 years. Fecal occult blood test (FOBT) of the stool. You may have this test every year starting at age 22. Flexible sigmoidoscopy or colonoscopy. You may have a sigmoidoscopy every 5 years or a colonoscopy every 10 years starting at age 92. Hepatitis C blood test. Hepatitis B blood test. Sexually transmitted disease (STD) testing. Diabetes screening. This is done by checking your blood sugar (glucose) after you have not eaten for a while (fasting). You may have this done every 1-3 years. Bone density scan. This is done to screen for osteoporosis. You may have this done starting at age 42. Mammogram. This may be done every  1-2 years. Talk to your health care provider about how often you should have regular mammograms. Talk with your health care provider about your test results, treatment options, and if necessary, the need for more tests. Vaccines  Your health care provider may recommend certain vaccines, such as: Influenza vaccine. This is recommended every year. Tetanus, diphtheria, and acellular pertussis (Tdap, Td) vaccine. You may need a Td  booster every 10 years. Zoster vaccine. You may need this after age 10. Pneumococcal 13-valent conjugate (PCV13) vaccine. One dose is recommended after age 50. Pneumococcal polysaccharide (PPSV23) vaccine. One dose is recommended after age 90. Talk to your health care provider about which screenings and vaccines you need and how often you need them. This information is not intended to replace advice given to you by your health care provider. Make sure you discuss any questions you have with your health care provider. Document Released: 03/02/2015 Document Revised: 10/24/2015 Document Reviewed: 12/05/2014 Elsevier Interactive Patient Education  2017 [place] Prevention in the Home Falls can cause injuries. They can happen to people of all ages. There are many things you can do to make your home safe and to help prevent falls. What can I do on the outside of my home? Regularly fix the edges of walkways and driveways and fix any cracks. Remove anything that might make you trip as you walk through a door, such as a raised step or threshold. Trim any bushes or trees on the path to your home. Use bright outdoor lighting. Clear any walking paths of anything that might make someone trip, such as rocks or tools. Regularly check to see if handrails are loose or broken. Make sure that both sides of any steps have handrails. Any raised decks and porches should have guardrails on the edges. Have any leaves, snow, or ice cleared regularly. Use sand or salt on walking paths during winter. Clean up any spills in your garage right away. This includes oil or grease spills. What can I do in the bathroom? Use night lights. Install grab bars by the toilet and in the tub and shower. Do not use towel bars as grab bars. Use non-skid mats or decals in the tub or shower. If you need to sit down in the shower, use a plastic, non-slip stool. Keep the floor dry. Clean up any water that spills on the floor  as soon as it happens. Remove soap buildup in the tub or shower regularly. Attach bath mats securely with double-sided non-slip rug tape. Do not have throw rugs and other things on the floor that can make you trip. What can I do in the bedroom? Use night lights. Make sure that you have a light by your bed that is easy to reach. Do not use any sheets or blankets that are too big for your bed. They should not hang down onto the floor. Have a firm chair that has side arms. You can use this for support while you get dressed. Do not have throw rugs and other things on the floor that can make you trip. What can I do in the kitchen? Clean up any spills right away. Avoid walking on wet floors. Keep items that you use a lot in easy-to-reach places. If you need to reach something above you, use a strong step stool that has a grab bar. Keep electrical cords out of the way. Do not use floor polish or wax that makes floors slippery. If you must use wax, use non-skid  floor wax. Do not have throw rugs and other things on the floor that can make you trip. What can I do with my stairs? Do not leave any items on the stairs. Make sure that there are handrails on both sides of the stairs and use them. Fix handrails that are broken or loose. Make sure that handrails are as long as the stairways. Check any carpeting to make sure that it is firmly attached to the stairs. Fix any carpet that is loose or worn. Avoid having throw rugs at the top or bottom of the stairs. If you do have throw rugs, attach them to the floor with carpet tape. Make sure that you have a light switch at the top of the stairs and the bottom of the stairs. If you do not have them, ask someone to add them for you. What else can I do to help prevent falls? Wear shoes that: Do not have high heels. Have rubber bottoms. Are comfortable and fit you well. Are closed at the toe. Do not wear sandals. If you use a stepladder: Make sure that it is  fully opened. Do not climb a closed stepladder. Make sure that both sides of the stepladder are locked into place. Ask someone to hold it for you, if possible. Clearly mark and make sure that you can see: Any grab bars or handrails. First and last steps. Where the edge of each step is. Use tools that help you move around (mobility aids) if they are needed. These include: Canes. Walkers. Scooters. Crutches. Turn on the lights when you go into a dark area. Replace any light bulbs as soon as they burn out. Set up your furniture so you have a clear path. Avoid moving your furniture around. If any of your floors are uneven, fix them. If there are any pets around you, be aware of where they are. Review your medicines with your doctor. Some medicines can make you feel dizzy. This can increase your chance of falling. Ask your doctor what other things that you can do to help prevent falls. This information is not intended to replace advice given to you by your health care provider. Make sure you discuss any questions you have with your health care provider. Document Released: 11/30/2008 Document Revised: 07/12/2015 Document Reviewed: 03/10/2014 Elsevier Interactive Patient Education  2017 [employer].

## 2022-07-10 DIAGNOSIS — F0150 Vascular dementia without behavioral disturbance: Secondary | ICD-10-CM | POA: Diagnosis not present

## 2022-08-09 DIAGNOSIS — Z743 Need for continuous supervision: Secondary | ICD-10-CM | POA: Diagnosis not present

## 2022-08-09 DIAGNOSIS — R69 Illness, unspecified: Secondary | ICD-10-CM | POA: Diagnosis not present

## 2022-08-10 DIAGNOSIS — F0150 Vascular dementia without behavioral disturbance: Secondary | ICD-10-CM | POA: Diagnosis not present

## 2022-10-15 DIAGNOSIS — I69818 Other symptoms and signs involving cognitive functions following other cerebrovascular disease: Secondary | ICD-10-CM | POA: Diagnosis not present

## 2022-11-03 ENCOUNTER — Other Ambulatory Visit: Payer: Self-pay | Admitting: Family Medicine

## 2022-11-03 DIAGNOSIS — E039 Hypothyroidism, unspecified: Secondary | ICD-10-CM

## 2022-11-19 ENCOUNTER — Other Ambulatory Visit: Payer: Self-pay | Admitting: Family Medicine

## 2022-11-20 NOTE — Telephone Encounter (Signed)
Requested medication (s) are due for refill today: yes  Requested medication (s) are on the active medication list: yes  Last refill:  11/20/21  Future visit scheduled: no  Notes to clinic:   Medication not assigned to a protocol, review manually.      Requested Prescriptions  Pending Prescriptions Disp Refills   NYAMYC powder [Pharmacy Med Name: NYAMYC 100000 UNIT/GM TOP POW GM]      Sig: POWDER TOPICALLY TO THE GLUTEAL REGION 2TIMES PER DAY FOR 30 DAYS.     Off-Protocol Failed - 11/19/2022  2:07 PM      Failed - Medication not assigned to a protocol, review manually.      Passed - Valid encounter within last 12 months    Recent Outpatient Visits           1 year ago Primary hypertension   [facility] Malva Limes, MD   1 year ago Primary hypertension   [facility] Malva Limes, MD   1 year ago Acute deep vein thrombosis (DVT) of calf muscle vein of right lower extremity [facility])   [facility] Malva Limes, MD   2 years ago Primary hypertension    [facility] Malva Limes, MD   2 years ago Hypothyroidism, unspecified type   [facility] Malva Limes, MD

## 2023-06-18 DEATH — deceased
